# Patient Record
Sex: Male | Born: 1950
Health system: Southern US, Community
[De-identification: ages and names within clinical notes are randomized; demographics above are authoritative.]

## PROBLEM LIST (undated history)

## (undated) DIAGNOSIS — I351 Nonrheumatic aortic (valve) insufficiency: Secondary | ICD-10-CM

## (undated) DIAGNOSIS — I509 Heart failure, unspecified: Secondary | ICD-10-CM

## (undated) DIAGNOSIS — R112 Nausea with vomiting, unspecified: Secondary | ICD-10-CM

## (undated) DIAGNOSIS — I4819 Other persistent atrial fibrillation: Secondary | ICD-10-CM

## (undated) DIAGNOSIS — I517 Cardiomegaly: Secondary | ICD-10-CM

## (undated) DIAGNOSIS — M75101 Unspecified rotator cuff tear or rupture of right shoulder, not specified as traumatic: Secondary | ICD-10-CM

## (undated) DIAGNOSIS — M1990 Unspecified osteoarthritis, unspecified site: Secondary | ICD-10-CM

## (undated) DIAGNOSIS — I340 Nonrheumatic mitral (valve) insufficiency: Secondary | ICD-10-CM

## (undated) DIAGNOSIS — I499 Cardiac arrhythmia, unspecified: Secondary | ICD-10-CM

## (undated) DIAGNOSIS — G709 Myoneural disorder, unspecified: Secondary | ICD-10-CM

## (undated) DIAGNOSIS — Z95810 Presence of automatic (implantable) cardiac defibrillator: Secondary | ICD-10-CM

## (undated) DIAGNOSIS — Z974 Presence of external hearing-aid: Secondary | ICD-10-CM

## (undated) DIAGNOSIS — Z9889 Other specified postprocedural states: Secondary | ICD-10-CM

## (undated) DIAGNOSIS — G4733 Obstructive sleep apnea (adult) (pediatric): Secondary | ICD-10-CM

## (undated) DIAGNOSIS — Z8679 Personal history of other diseases of the circulatory system: Secondary | ICD-10-CM

## (undated) DIAGNOSIS — E669 Obesity, unspecified: Secondary | ICD-10-CM

## (undated) DIAGNOSIS — Z9989 Dependence on other enabling machines and devices: Secondary | ICD-10-CM

## (undated) DIAGNOSIS — Z973 Presence of spectacles and contact lenses: Secondary | ICD-10-CM

## (undated) DIAGNOSIS — I10 Essential (primary) hypertension: Secondary | ICD-10-CM

## (undated) DIAGNOSIS — R0600 Dyspnea, unspecified: Secondary | ICD-10-CM

## (undated) HISTORY — DX: Cardiomegaly: I51.7

## (undated) HISTORY — DX: Presence of external hearing-aid: Z97.4

## (undated) HISTORY — DX: Other persistent atrial fibrillation: I48.19

## (undated) HISTORY — DX: Obesity, unspecified: E66.9

## (undated) HISTORY — PX: PACEMAKER INSERTION: SHX728

## (undated) HISTORY — PX: TONSILLECTOMY: SUR1361

## (undated) HISTORY — DX: Nonrheumatic aortic (valve) insufficiency: I35.1

## (undated) HISTORY — DX: Nonrheumatic mitral (valve) insufficiency: I34.0

---

## 1989-03-14 HISTORY — PX: SHOULDER OPEN ROTATOR CUFF REPAIR: SHX2407

## 1989-03-14 HISTORY — PX: KNEE ARTHROSCOPY: SHX127

## 2004-07-14 HISTORY — PX: TOTAL KNEE ARTHROPLASTY: SHX125

## 2004-11-05 ENCOUNTER — Encounter: Admission: RE | Admit: 2004-11-05 | Discharge: 2004-11-05 | Payer: Self-pay | Admitting: Internal Medicine

## 2005-01-01 ENCOUNTER — Inpatient Hospital Stay (HOSPITAL_COMMUNITY): Admission: RE | Admit: 2005-01-01 | Discharge: 2005-01-03 | Payer: Self-pay | Admitting: Orthopedic Surgery

## 2005-01-31 ENCOUNTER — Emergency Department (HOSPITAL_COMMUNITY): Admission: EM | Admit: 2005-01-31 | Discharge: 2005-01-31 | Payer: Self-pay | Admitting: Plastic Surgery

## 2005-01-31 ENCOUNTER — Ambulatory Visit (HOSPITAL_COMMUNITY): Admission: RE | Admit: 2005-01-31 | Discharge: 2005-01-31 | Payer: Self-pay | Admitting: Internal Medicine

## 2006-07-14 HISTORY — PX: CARPAL TUNNEL RELEASE: SHX101

## 2007-06-04 ENCOUNTER — Ambulatory Visit (HOSPITAL_BASED_OUTPATIENT_CLINIC_OR_DEPARTMENT_OTHER): Admission: RE | Admit: 2007-06-04 | Discharge: 2007-06-04 | Payer: Self-pay | Admitting: Orthopedic Surgery

## 2007-07-18 ENCOUNTER — Emergency Department (HOSPITAL_COMMUNITY): Admission: EM | Admit: 2007-07-18 | Discharge: 2007-07-18 | Payer: Self-pay | Admitting: Emergency Medicine

## 2007-11-12 ENCOUNTER — Ambulatory Visit (HOSPITAL_COMMUNITY): Admission: RE | Admit: 2007-11-12 | Discharge: 2007-11-13 | Payer: Self-pay | Admitting: Specialist

## 2007-11-12 HISTORY — PX: SHOULDER OPEN ROTATOR CUFF REPAIR: SHX2407

## 2008-04-30 ENCOUNTER — Encounter: Admission: RE | Admit: 2008-04-30 | Discharge: 2008-04-30 | Payer: Self-pay | Admitting: Orthopedic Surgery

## 2008-07-14 HISTORY — PX: TOTAL HIP ARTHROPLASTY: SHX124

## 2008-08-08 ENCOUNTER — Encounter: Admission: RE | Admit: 2008-08-08 | Discharge: 2008-08-08 | Payer: Self-pay | Admitting: Orthopedic Surgery

## 2008-09-26 ENCOUNTER — Encounter: Admission: RE | Admit: 2008-09-26 | Discharge: 2008-09-26 | Payer: Self-pay | Admitting: Chiropractic Medicine

## 2008-11-16 ENCOUNTER — Encounter: Admission: RE | Admit: 2008-11-16 | Discharge: 2008-11-16 | Payer: Self-pay | Admitting: Orthopedic Surgery

## 2008-11-23 ENCOUNTER — Encounter: Admission: RE | Admit: 2008-11-23 | Discharge: 2008-11-23 | Payer: Self-pay | Admitting: Orthopedic Surgery

## 2008-12-18 ENCOUNTER — Encounter: Admission: RE | Admit: 2008-12-18 | Discharge: 2008-12-18 | Payer: Self-pay | Admitting: Specialist

## 2009-01-02 ENCOUNTER — Encounter: Admission: RE | Admit: 2009-01-02 | Discharge: 2009-01-02 | Payer: Self-pay | Admitting: Specialist

## 2009-02-28 ENCOUNTER — Inpatient Hospital Stay (HOSPITAL_COMMUNITY): Admission: RE | Admit: 2009-02-28 | Discharge: 2009-03-03 | Payer: Self-pay | Admitting: Orthopedic Surgery

## 2009-03-25 ENCOUNTER — Emergency Department (HOSPITAL_COMMUNITY): Admission: EM | Admit: 2009-03-25 | Discharge: 2009-03-26 | Payer: Self-pay | Admitting: Emergency Medicine

## 2010-10-18 LAB — HEPATIC FUNCTION PANEL
ALT: 26 U/L (ref 0–53)
AST: 24 U/L (ref 0–37)
Alkaline Phosphatase: 89 U/L (ref 39–117)
Bilirubin, Direct: 0.1 mg/dL (ref 0.0–0.3)
Indirect Bilirubin: 0.8 mg/dL (ref 0.3–0.9)
Total Protein: 7.1 g/dL (ref 6.0–8.3)

## 2010-10-18 LAB — POCT I-STAT, CHEM 8
Calcium, Ion: 1.1 mmol/L — ABNORMAL LOW (ref 1.12–1.32)
Hemoglobin: 14.3 g/dL (ref 13.0–17.0)
Sodium: 138 mEq/L (ref 135–145)
TCO2: 22 mmol/L (ref 0–100)

## 2010-10-18 LAB — URINALYSIS, ROUTINE W REFLEX MICROSCOPIC
Nitrite: NEGATIVE
Protein, ur: NEGATIVE mg/dL
Specific Gravity, Urine: 1.021 (ref 1.005–1.030)
pH: 6 (ref 5.0–8.0)

## 2010-10-18 LAB — URINE MICROSCOPIC-ADD ON

## 2010-10-18 LAB — CBC
Hemoglobin: 14 g/dL (ref 13.0–17.0)
MCV: 84.2 fL (ref 78.0–100.0)
Platelets: 229 10*3/uL (ref 150–400)
WBC: 8.4 10*3/uL (ref 4.0–10.5)

## 2010-10-18 LAB — DIFFERENTIAL
Basophils Absolute: 0 10*3/uL (ref 0.0–0.1)
Eosinophils Relative: 0 % (ref 0–5)
Monocytes Absolute: 0.6 10*3/uL (ref 0.1–1.0)
Monocytes Relative: 7 % (ref 3–12)
Neutro Abs: 6.6 10*3/uL (ref 1.7–7.7)
Neutrophils Relative %: 79 % — ABNORMAL HIGH (ref 43–77)

## 2010-10-18 LAB — LIPASE, BLOOD: Lipase: 31 U/L (ref 11–59)

## 2010-10-19 LAB — BASIC METABOLIC PANEL
BUN: 17 mg/dL (ref 6–23)
CO2: 30 mEq/L (ref 19–32)
CO2: 31 mEq/L (ref 19–32)
Calcium: 8.4 mg/dL (ref 8.4–10.5)
Chloride: 101 mEq/L (ref 96–112)
Chloride: 102 mEq/L (ref 96–112)
GFR calc Af Amer: >60 mL/min (ref >60)
GFR calc Af Amer: >60 mL/min (ref >60)
GFR calc non Af Amer: >60 mL/min (ref >60)
GFR calc non Af Amer: >60 mL/min (ref >60)
Glucose, Bld: 116 mg/dL — ABNORMAL HIGH (ref 70–99)
Glucose, Bld: 150 mg/dL — ABNORMAL HIGH (ref 70–99)
Glucose, Bld: 94 mg/dL (ref 70–99)
Potassium: 4.2 mEq/L (ref 3.5–5.1)
Potassium: 4.8 mEq/L (ref 3.5–5.1)
Sodium: 136 mEq/L (ref 135–145)
Sodium: 139 mEq/L (ref 135–145)
Sodium: 142 mEq/L (ref 135–145)

## 2010-10-19 LAB — CBC
HCT: 35.7 % — ABNORMAL LOW (ref 39.0–52.0)
HCT: 36.2 % — ABNORMAL LOW (ref 39.0–52.0)
HCT: 37.9 % — ABNORMAL LOW (ref 39.0–52.0)
HCT: 44.3 % (ref 39.0–52.0)
Hemoglobin: 12.3 g/dL — ABNORMAL LOW (ref 13.0–17.0)
Hemoglobin: 12.4 g/dL — ABNORMAL LOW (ref 13.0–17.0)
Hemoglobin: 13.1 g/dL (ref 13.0–17.0)
Hemoglobin: 15.1 g/dL (ref 13.0–17.0)
MCHC: 34.1 g/dL (ref 30.0–36.0)
MCHC: 34.8 g/dL (ref 30.0–36.0)
MCV: 86.2 fL (ref 78.0–100.0)
MCV: 86.3 fL (ref 78.0–100.0)
MCV: 87.4 fL (ref 78.0–100.0)
MCV: 85.7 fL (ref 78.0–100.0)
Platelets: 156 10*3/uL (ref 150–400)
Platelets: 163 10*3/uL (ref 150–400)
RBC: 4.14 MIL/uL — ABNORMAL LOW (ref 4.22–5.81)
RDW: 13.2 % (ref 11.5–15.5)
RDW: 13.6 % (ref 11.5–15.5)
RDW: 13.2 % (ref 11.5–15.5)
WBC: 12.7 10*3/uL — ABNORMAL HIGH (ref 4.0–10.5)

## 2010-10-19 LAB — COMPREHENSIVE METABOLIC PANEL
Alkaline Phosphatase: 48 U/L (ref 39–117)
BUN: 16 mg/dL (ref 6–23)
Chloride: 102 mEq/L (ref 96–112)
Creatinine, Ser: 1.04 mg/dL (ref 0.4–1.5)
Glucose, Bld: 137 mg/dL — ABNORMAL HIGH (ref 70–99)
Potassium: 3.2 mEq/L — ABNORMAL LOW (ref 3.5–5.1)
Total Bilirubin: 0.6 mg/dL (ref 0.3–1.2)
Total Protein: 7.2 g/dL (ref 6.0–8.3)

## 2010-10-19 LAB — URINALYSIS, ROUTINE W REFLEX MICROSCOPIC
Glucose, UA: NEGATIVE mg/dL
Hgb urine dipstick: NEGATIVE
Ketones, ur: NEGATIVE mg/dL
Protein, ur: NEGATIVE mg/dL
pH: 6 (ref 5.0–8.0)

## 2010-10-19 LAB — PROTIME-INR
INR: 1 (ref 0.00–1.49)
Prothrombin Time: 12.9 seconds (ref 11.6–15.2)

## 2010-10-19 LAB — APTT: aPTT: 24 seconds (ref 24–37)

## 2010-11-26 NOTE — Op Note (Signed)
[name], [name]               ACCOUNT NO.:  [id number]   MEDICAL RECORD NO.:  [id number]          PATIENT TYPE:  AMB   LOCATION:  DSC                          FACILITY:  [facility]   PHYSICIAN:  Cindee Salt, M.D.       DATE OF BIRTH:  03/24/1951   DATE OF PROCEDURE:  06/04/2007  DATE OF DISCHARGE:                               OPERATIVE REPORT   PREOPERATIVE DIAGNOSIS:  Carpal tunnel syndrome, left hand.   POSTOPERATIVE DIAGNOSIS:  Carpal tunnel syndrome, left hand.   OPERATION:  Decompression of left median nerve.   SURGEON:  Cindee Salt, M.D.   ASSISTANT:  Alfredo Bach, R.N.   ANESTHESIA:  Forearm-based IV regional.   HISTORY:  The patient is a 60 year old male with a history of carpal  tunnel syndrome, EMG and nerve conductions positive, which has not  responded to conservative treatment.  He is desirous of having this  surgically released.  He is aware of risks and complications including  infection, recurrence, injury to arteries, nerves and tendons,  incomplete relief of symptoms and dystrophy.  In the preoperative area,  the patient is seen, questions encouraged and answered, the extremity  marked by both the patient and surgeon, antibiotic given.   PROCEDURE:  The patient was brought to the operating room and a forearm-  based IV regional anesthetic was carried out without difficulty.  He was  prepped using DuraPrep, supine position, left arm free.  A longitudinal  incision was made in the palm and carried down through subcutaneous  tissue.  Bleeders were electrocauterized.  Palmar fascia was split,  superficial palmar arch identified, the flexor tendon to the ring and  little finger identified.  To the ulnar side of the median nerve, the  carpal retinaculum was incised with sharp dissection.  A right-angle and  Sewall retractor were placed between skin and forearm fascia.  The  fascia was released for approximately a centimeter and a half proximal  to the wrist crease  under direct vision.  The canal was found to be  extremely tight and area of compression of the nerve apparent.  No  further lesions were identified.  The skin was closed with interrupted 5-  0 Vicryl Rapide sutures.  A sterile compressive dressing and splint was  applied to the wrist.  The patient tolerated the procedure well and  taken to the recovery room for observation in satisfactory condition.  He is discharged home to return to the [facility] [place] in 1  week on Vicodin.           ______________________________  Cindee Salt, M.D.     GK/MEDQ  D:  06/04/2007  T:  06/05/2007  Job:  [id number]   cc:   Ladell Pier, M.D.

## 2010-11-26 NOTE — Op Note (Signed)
[name], [name]               ACCOUNT NO.:  [id number]   MEDICAL RECORD NO.:  [id number]          PATIENT TYPE:  INP   LOCATION:  0007                         FACILITY:  [facility]   PHYSICIAN:  Ollen Gross, M.D.    DATE OF BIRTH:  10/10/1950   DATE OF PROCEDURE:  03/01/2009  DATE OF DISCHARGE:                               OPERATIVE REPORT   PREOPERATIVE DIAGNOSIS:  Osteoarthritis left hip.   POSTOPERATIVE DIAGNOSIS:  Osteoarthritis left hip.   PROCEDURE:  Left total hip arthroplasty.   SURGEON:  Ollen Gross, M.D.   ASSISTANT:  Rozell Searing, PA-C   ANESTHESIA:  General.   DRAINS:  Hemovac x1.   COMPLICATIONS:  None.   CONDITION:  Stable to recovery room.   CLINICAL NOTE:  Mr. [name] is a 60 year old male who has a rapidly  progressive arthritis of the left hip with severe pain and dysfunction.  He has essentially gone from fairly normal looking hip to one that is  completely bone-on-bone in a relatively short amount of time.  He has  had intractable pain and given options, elects to proceed with a total  hip arthroplasty.   PROCEDURE IN DETAIL:  After successful administration of general  anesthetic, the patient is placed in the right lateral decubitus  position with the left side up and held with the hip positioner.  The  left lower extremity is isolated from his perineum with plastic drapes  and prepped and draped in the usual sterile fashion.  Short  posterolateral incision is made with a 10 blade through subcutaneous  tissue to the level of the fascia lata which was incised in line with  the skin incision.  The sciatic nerve was palpated and protected and  short external rotators were isolated off the femur.  Capsulotomy was  performed and the hip was dislocated.  The center of femoral head is  marked and the trial prosthesis is placed such that the center of trial  head corresponds to the center of his native femoral head.  Osteotomy  line is marked on the  femoral neck and osteotomy made with an  oscillating saw.  The femoral head is removed and the femur retracted  anteriorly to gain acetabular exposure.   Acetabular retractors were placed and labrum and osteophytes removed.  Acetabular reaming begins at 47 mm coursing in increments of 2 to 53 mm  and then a 54-mm Pinnacle acetabular shell is impacted in anatomic  position with outstanding purchase.  There is no need for additional  dome screws.  Apex hole eliminator is placed and the 36 mm neutral  Ultramet metal liner is placed for metal-on-metal hip replacement.   The femur is prepared with the canal finder and irrigation.  Axial  reaming is performed to 15.5 mm proximal, reaming to 20-F and the sling  machined to a small a 20-F.  A 20-F small trial sleeve is placed with a  20 x 15 stem and a 36 plus 8 neck matching native anteversion.  A 36  plus 0 head is placed and the hips reduced with outstanding stability.  There is full extension, full external rotation to 70 degrees flexion,  40 degrees adduction, 90 degrees internal rotation, 90 degrees of  flexion, 70 degrees of internal rotation.  By placing the left leg on  top of the right I felt as though leg lengths were equal.  The hip was  then dislocated and trials removed.  The permanent 20-F small sleeve is  placed and a 20 x 15 stem, 36 plus 8 neck matching native anteversion.  The 36 plus 0 head is placed and the hip reduced with the same stability  parameters.  Wound is copiously irrigated with saline solution and then  the capsule and short rotators reattached to the femur through drill  holes and Ethibond suture.  Fascia lata was closed over Hemovac drain  with #1 Vicryl,  subcu closed with #1-0 and #2-0 Vicryl  and  subcuticular running 4-0 Monocryl.  Incision was then cleaned and dried  and Steri-Strips and bulky sterile dressing were applied.  He is then  placed into a knee immobilizer, awakened and transferred to recovery  in  stable condition.      Ollen Gross, M.D.  Electronically Signed     FA/MEDQ  D:  02/28/2009  T:  03/01/2009  Job:  [id number]

## 2010-11-26 NOTE — H&P (Signed)
[name], [name]               ACCOUNT NO.:  [id number]   MEDICAL RECORD NO.:  [id number]          PATIENT TYPE:  INP   LOCATION:  NA                           FACILITY:  [facility]   PHYSICIAN:  [name], M.D.    DATE OF BIRTH:  1950-07-17   DATE OF ADMISSION:  02/28/2009  DATE OF DISCHARGE:                              HISTORY & PHYSICAL   CHIEF COMPLAINT:  Left hip pain.   HISTORY OF PRESENT ILLNESS:  The patient is a 60 year old male who has  been seen by Dr. Lequita Andrews, referred over by Dr. Jillyn Andrews for deterioration of  the hip.  It has been ongoing for quite some time now.  He as initially  evaluated over a year ago by Dr. Wyline Andrews and felt he had some intra-  articular issues.  He went for a cortisone injection and fortunately did  not provide much benefit.  MRI showed deterioration of the hip with  possible labral tear.  Worked up also for the lumbar spine with MRI and  injections, but no benefit.  Dr. Jeral Andrews told him initially that it was  definitely coming from the hip.  He has had progressive worsening pain.  He is seeing Dr. Jillyn Andrews to differentiate between the hip and the back.  He  went for intra-articular lidocaine injection which provided considerable  relief.  He followed back up with Dr. Lequita Andrews and found to have  progressive collapse of the joint space and now he is bone-on-bone.  He  is felt to have a rapidly progressive osteoarthritis with synovitis.  It  is felt he would benefit undergoing surgical intervention.  Risks and  benefits have been discussed.  He elects to with surgery.  The patient  has been seen preoperatively by Dr. Nehemiah Andrews and felt to be stable for  surgery.   ALLERGIES:  OXYCODONE AND MORPHINE.   CURRENT MEDICATIONS:  [name], [name], [name].   PAST MEDICAL HISTORY:  1. Hypertension.  2. Sleep apnea, uses CPAP.  3. Tinnitus.   PAST SURGICAL HISTORY:  1. Right total knee in June 2007 with multiple knee surgeries.  2. Left  rotator cuff.  3. Right rotator cuff.   SOCIAL HISTORY:  Denies use of tobacco products.  Sketchy use of  alcohol.   FAMILY HISTORY:  Negative.   REVIEW OF SYSTEMS:  GENERAL:  No fevers, chills or night sweats.  NEUROLOGICAL:  No seizures, syncope or paralysis.  RESPIRATORY:  No  shortness of breath, productive cough or hemoptysis.  CARDIOVASCULAR:  No chest pain, angina, orthopnea.  GI:  No nausea, vomiting, diarrhea or  constipation.  GU:  No dysuria, hematuria or discharge.  MUSCULOSKELETAL:  Left hip.   PHYSICAL EXAMINATION:  VITAL SIGNS:  Pulse 76, respirations 14, blood  pressure 140/88.  GENERAL:  A 60 year old white male, well-nourished, well-developed,  overweight, short stature, no acute distress.  HEENT:  Normocephalic, atraumatic.  Pupils round and reactive.  EOMs  intact.  NECK:  Supple.  No carotid bruits.  CHEST:  Clear anterior posterior chest walls.  No rhonchi, rales or  wheezing.  HEART:  Regular rate  and rhythm.  No murmur, S1-S10.  ABDOMEN:  Soft, protuberant abdomen.  Bowel sounds present.  RECTAL/BREASTS/GENITALIA:  Not done.  Not pertinent to present illness.  EXTREMITIES:  Left hip flexion 90, zero internal rotation, zero external  rotation, 20 degrees abduction.   IMPRESSION:  Osteoarthritis left hip.   PLAN:  The patient admitted to [facility] to undergo a left  total replacement arthroplasty.  Surgery will be performed by [name].      [name], P.A.C.      [name], M.D.  Electronically Signed    ALP/MEDQ  D:  02/23/2009  T:  02/23/2009  Job:  [id number]   cc:   [name], M.D.  Fax: [phone]

## 2010-11-26 NOTE — Op Note (Signed)
[name], [name]               ACCOUNT NO.:  [id number]   MEDICAL RECORD NO.:  [id number]          PATIENT TYPE:  AMB   LOCATION:  DAY                          FACILITY:  [facility]   PHYSICIAN:  Jene Every, M.D.    DATE OF BIRTH:  May 16, 1951   DATE OF PROCEDURE:  DATE OF DISCHARGE:                               OPERATIVE REPORT   PREOPERATIVE DIAGNOSES:  Rotator cuff tear and degenerative joint  disease of the right shoulder.   POSTOPERATIVE DIAGNOSES:  Rotator cuff tear and degenerative joint  disease of the right shoulder with dislocation of biceps tendon.   PROCEDURE PERFORMED:  Open rotator cuff repair, subacromial  decompression, acromioplasty, followed by biceps tenodesis.   ANESTHESIA:  General.   ASSISTANT:  Roma Schanz, P.A.   BRIEF HISTORY:  The patient is a 60 year old who fell on his shoulder,  having pain in the shoulder, rotator cuff tear, massive, noted by MRI.  Also dislocation of biceps tendon, elevation of the humeral head.  Indicated for attempted repair of the rotator cuff, possible biceps  tenodesis and patch graft.  Risks and benefits discussed including  bleeding, infection, inability to repair, suboptimal range of motion,  need for revision, and shoulder arthroplasty future, et Karie Soda.   DESCRIPTION OF PROCEDURE:  The patient in a beach-chair position.  After  induction of adequate anesthesia, 2 grams Kefzol, the right shoulder and  upper extremity was prepped and draped in the usual sterile fashion.  Surgical marker was utilized to delineate the acromion, AC joint and  coracoid.  An incision was made over the anterolateral aspect of the  acromion in the midline with a raphe.  Subcutaneous tissue was  dissected.  Bipolar cautery was utilized to achieve hemostasis.  Two  centimeters on either side of the anterolateral corner was utilized.  The deltoid was subperiosteally elevated from the anterolateral,  anteromedial aspect of the acromion, CA  ligament was attached.  Subacromial adhesions were digitally lysed.  Oscillating saw was  utilized to remove the anterior part of the acromion.  Wound was  copiously irrigated.  Arthrosis was noted of the humeral head and of the  glenoid.  There was a complete absence of the rotator cuff on the  glenoid.   On careful inspection and multiple passes, there was no infraspinatus or  supraspinatus that was able to be advanced to the tuberosity for repair.  There was subscapularis tendinous tear that was torn as well in the  superior portion.  It was retracted, very attenuated.  Biceps tendon was  intact.  Its attachment, however, was dislocated medially.  We felt this  would aid as the humeral head depressor.  We proceeded with a biceps  tenodesis.  We found the bicipital groove, curetted to good bleeding  tissue and placed a Bio-Anchor after an awl and then insertion of the  anchor in the proximal portion of that with excellent purchase with  FiberWire.  The tendon was advanced to the bicipital groove by the  FiberWires that were placed through the tendon and wide aperture with a  surgeon's knot advancing the tendon  into the bicipital groove.  This was  up to the humeral head.   Just prior to that, I also curetted the lesser tuberosity.  The remnant  of the superior portion of the subscapularis was advanced to that  portion of the tuberosity and the FiberWire suture ends that were  preserved after the surgical knot were then advanced into the  subscapularis and used to tied the subscapularis down to the lesser  tuberosity.  Again this was very attenuated.  This was oversewn as well  with the biceps tendon.  The wound was then copiously irrigated.  We  explored once again for remnant of the supraspinatus and the  infraspinatus which were not available.  We therefore felt that this was  the best repair possible and at least might give him some internal  rotation power, as well as depressing  the humeral head.  Wound copiously  irrigated.  I reattach the CA ligament in the anterior aspect of the  acromion and repaired the raphe with #1 Vicryl interrupted figure-of-  eight sutures.  Subcutaneous tissue reapproximated 2-0 Vicryl.  The skin  was reapproximated with 4-0 subcuticular Prolene.  Wound reinforced with  Steri-Strips.  Sterile dressing applied, placed in abduction.  Extubated  without difficulty and transported to the recovery room in satisfactory  condition.  The patient tolerated the procedure well, there were no  complications.      Jene Every, M.D.  Electronically Signed     JB/MEDQ  D:  11/12/2007  T:  11/12/2007  Job:  [id number]

## 2010-11-29 NOTE — Discharge Summary (Signed)
[name], [name]               ACCOUNT NO.:  [id number]   MEDICAL RECORD NO.:  [id number]          PATIENT TYPE:  INP   LOCATION:  5016                         FACILITY:  [facility]   PHYSICIAN:  Elana Alm. Thurston Hole, M.D. DATE OF BIRTH:  15-Oct-1950   DATE OF ADMISSION:  01/01/2005  DATE OF DISCHARGE:  01/03/2005                                 DISCHARGE SUMMARY   ADMISSION DIAGNOSES:  1.  End-stage degenerative joint disease, right knee.  2.  Hypertension.   DISCHARGE DIAGNOSES:  1.  End-stage degenerative joint disease, right knee.  2.  Hypertension.  3.  Sleep apnea.   HISTORY OF PRESENT ILLNESS:  The patient is a 60 year old white male who has  had a history of end-stage DJD of his right knee.  He has tried conservative  care, including anti-inflammatories and cortisone injections without  success.  He understands the risks, benefits, and possible complications of  a right total knee replacement and is without question.   PROCEDURE:  On January 01, 2005, patient underwent a right total knee  replacement by Dr. Thurston Hole and a femoral nerve block by anesthesia.  He  tolerated both procedures well.  Respiratory care was consulted for CPAP  placement.   HOSPITAL COURSE:  On postoperative day #1, the patient's vital signs were  stable, he was afebrile.  Hemoglobin was 12.9, and he was metabolically  stable.  Surgical wounds well-approximated.  His drain was discontinued.  His Foley was discontinued.  Physical therapy was begun.   On postoperative day #2, vital signs were stable, he was afebrile.  His  hemoglobin was 12.7.  He was metabolically stable.  Surgical wounds were  well-approximated.  He was discharged to home, weightbearing as tolerated,  in stable condition.   DISCHARGE MEDICATIONS:  1.  Lovenox.  2.  Coumadin.  3.  Lisinopril 40 mg one p.o. b.i.d.  4.  Colace 100 mg one tablet twice a day.  5.  Milk of Magnesia 60 cc p.o. today.   DISCHARGE INSTRUCTIONS:  CPM 0 to 90  degrees eight hours a day.  Elevate his  right heel on full pillow 30 minutes every morning.  Call with increased  pain, increased swelling, increased drainage, or a temperature greater than  101.   FOLLOW UP:  He will follow up with Dr. Thurston Hole on January 13, 2005.      Kirstin Shepperson, P.A.      Robert A. Thurston Hole, M.D.  Electronically Signed    KS/MEDQ  D:  03/13/2005  T:  03/13/2005  Job:  [id number]

## 2010-11-29 NOTE — Op Note (Signed)
[name], [name]               ACCOUNT NO.:  [id number]   MEDICAL RECORD NO.:  [id number]          PATIENT TYPE:  INP   LOCATION:  2550                         FACILITY:  [facility]   PHYSICIAN:  Robert A. Thurston Hole, M.D. DATE OF BIRTH:  Dec 21, 1950   DATE OF PROCEDURE:  01/01/2005  DATE OF DISCHARGE:                                 OPERATIVE REPORT   PREOPERATIVE DIAGNOSIS:  Right knee DJD.   POSTOPERATIVE DIAGNOSIS:  Right knee DJD.   PROCEDURE:  1.  Right total knee replacement using DePuy cemented total knee system with      #5 cemented femur, #4 cemented tibia with 10 mm polyethylene RP tibial      spacer and 35 mm polyethylene cemented patella.  2.  Right knee computer assisted navigation.   SURGEON:  Elana Alm. Thurston Hole, M.D.   ASSISTANT:  Julien Girt, P.A.   ANESTHESIA:  General.   OPERATIVE TIME:  1 hour and 50 minutes.   COMPLICATIONS:  None.   DESCRIPTION OF PROCEDURE:  Mr. Gombos was brought to operating room on January 01, 2005, placed on the operative table in supine position. After adequate  level of general anesthesia was obtained, he had a femoral nerve block  placed by anesthesia for postoperative pain control.  He had a Foley  catheter placed under sterile conditions. He received Ancef 1 g IV  preoperatively for prophylaxis. His right knee was examined. He had a range  of motion from -20 to 125 degrees, mild valgus deformity, knee stable to  varus valgus and posterior stress with normal patellar tracking. Right leg  and foot was prepped using sterile DuraPrep and draped using sterile  technique. Leg was exsanguinated and a thigh tourniquet elevated to 365 mm.  Initially through a 15 cm longitudinal incision based over the patella,  initial exposure was made.  The underlying subcutaneous tissues were incises  in line with skin incision. A median arthrotomy was performed revealing an  excessive amount of normal-appearing joint fluid. The articular surfaces  were inspected. He had grade 4 changes medially, laterally and the  patellofemoral joint. Osteophytes were removed off the femoral condyles and  tibial plateau. After this was done then medial and lateral meniscal  remnants were removed as well as the anterior cruciate ligament. At this  point then, two pins were placed and the distal femur and 2 pounds of the  proximal tibia for placement of the computer navigation system. The system  was then activated. Initial measurement showed flexion contracture of 20  degrees and valgus malalignment of approximately 7 degrees.  At this point  then distal femoral cuts were made using computer navigation resecting 11 mm  off the distal femur. These cuts were all verified and shown to be perfect  room with the navigation system.  A number 5 was found to be appropriate  size. A #5 cutting jig was placed and then these cuts were made. Again these  were all verified with the computer for perfect and accurate cuts. At this  point the proximal tibia was exposed and then using the computer navigation  system, a 10 mm proximal cut was made in the appropriate rotation. After  this cut was made, the number four tibial guide was placed, checked for  verification and found to have a perfect position and then the keel cut was  made. The #5 femoral trial was placed and with a 10 mm polyethylene tibial  spacer, the knee was taken through a full range of motion, found to have  excellent correction of this flexion deformity to approximately minus 3-5  degrees and his valgus deformity was only 1 degree to neutral. The patella  was then sized. A resurfacing 8 mm cut was made and three locking holes were  placed for a 35 mm prosthesis. With the patella prosthesis in place, a  patellar tracking was evaluated and this was found to be normal. At this  point it was felt that all trial components were excellent size, fit and  stability and then the deformities had been  corrected. The navigation system  was then deactivated and the pins were removed. The trial components were  removed. The knee was then jet lavage irrigated with 3 liters of saline and  then the proximal tibia was exposed and a #4 tibial tray with cement backing  was hammered into position with an excellent fit with excess cement being  removed from around the edges. The #5 femoral component was hammered into  position also with an excellent fit with excess cement being removed around  the edges. A 10 mm polyethylene RP tibial spacer was then placed on the  tibial baseplate and knee taken through a full range of motion from 0 to 125  degrees with excellent stability and excellent correction of his valgus and  flexion deformities. The 35 mm polyethylene cement backed patella was then  placed into its three locking holes and held there with a clamp. After the  cement hardened, patellofemoral tracking was evaluated and this was found to  be normal. At this point, the tourniquet was released. Hemostasis was  obtained with cautery. The wound was copiously irrigated again and then the  arthrotomy was closed with a #1 Ethibond suture over two medium Hemovac  drains.  Subcutaneous tissues closed with 0 and 2-0 Vicryl, skin closed with  subcuticular Monocryl. Steri-Strips were applied. Sterile dressings and a  long-leg splint applied. A Hemovac injected with 0.25% Marcaine with  epinephrine and 4 milligrams morphine and the patient awakened and taken to  recovery in stable condition.   SPONGE AND NEEDLE COUNT:  Correct x 2 at the end of the case.       RAW/MEDQ  D:  01/01/2005  T:  01/01/2005  Job:  [id number]

## 2011-04-22 LAB — BASIC METABOLIC PANEL
BUN: 11
Calcium: 9.3
GFR calc non Af Amer: >60
Glucose, Bld: 94

## 2011-06-14 DIAGNOSIS — M75101 Unspecified rotator cuff tear or rupture of right shoulder, not specified as traumatic: Secondary | ICD-10-CM

## 2011-06-14 HISTORY — DX: Unspecified rotator cuff tear or rupture of right shoulder, not specified as traumatic: M75.101

## 2011-09-25 ENCOUNTER — Other Ambulatory Visit: Payer: Self-pay | Admitting: Orthopedic Surgery

## 2011-09-26 ENCOUNTER — Encounter (HOSPITAL_COMMUNITY): Payer: Self-pay | Admitting: Pharmacy Technician

## 2011-10-01 ENCOUNTER — Encounter (HOSPITAL_COMMUNITY)
Admission: RE | Admit: 2011-10-01 | Discharge: 2011-10-01 | Disposition: A | Discharge status: Home / Self Care | Payer: 59 | Source: Ambulatory Visit | Attending: Orthopedic Surgery | Admitting: Orthopedic Surgery

## 2011-10-01 ENCOUNTER — Ambulatory Visit (HOSPITAL_COMMUNITY)
Admission: RE | Admit: 2011-10-01 | Discharge: 2011-10-01 | Disposition: A | Discharge status: Home / Self Care | Payer: 59 | Source: Ambulatory Visit | Attending: Orthopedic Surgery | Admitting: Orthopedic Surgery

## 2011-10-01 ENCOUNTER — Encounter (HOSPITAL_COMMUNITY): Payer: Self-pay

## 2011-10-01 DIAGNOSIS — M25559 Pain in unspecified hip: Secondary | ICD-10-CM | POA: Insufficient documentation

## 2011-10-01 DIAGNOSIS — Z01818 Encounter for other preprocedural examination: Secondary | ICD-10-CM | POA: Insufficient documentation

## 2011-10-01 DIAGNOSIS — Z01812 Encounter for preprocedural laboratory examination: Secondary | ICD-10-CM | POA: Insufficient documentation

## 2011-10-01 HISTORY — DX: Essential (primary) hypertension: I10

## 2011-10-01 HISTORY — DX: Other specified postprocedural states: Z98.890

## 2011-10-01 HISTORY — DX: Nausea with vomiting, unspecified: R11.2

## 2011-10-01 HISTORY — DX: Unspecified rotator cuff tear or rupture of right shoulder, not specified as traumatic: M75.101

## 2011-10-01 LAB — CBC
HCT: 47.1 % (ref 39.0–52.0)
MCH: 28.2 pg (ref 26.0–34.0)
MCV: 83.7 fL (ref 78.0–100.0)
Platelets: 177 10*3/uL (ref 150–400)
RDW: 13.3 % (ref 11.5–15.5)

## 2011-10-01 LAB — COMPREHENSIVE METABOLIC PANEL
AST: 21 U/L (ref 0–37)
Albumin: 4.2 g/dL (ref 3.5–5.2)
BUN: 14 mg/dL (ref 6–23)
CO2: 30 mEq/L (ref 19–32)
Calcium: 9.4 mg/dL (ref 8.4–10.5)
Chloride: 101 mEq/L (ref 96–112)
Creatinine, Ser: 0.81 mg/dL (ref 0.50–1.35)
GFR calc non Af Amer: >90 mL/min (ref >90)
Total Bilirubin: 0.5 mg/dL (ref 0.3–1.2)

## 2011-10-01 LAB — URINALYSIS, ROUTINE W REFLEX MICROSCOPIC
Bilirubin Urine: NEGATIVE
Ketones, ur: NEGATIVE mg/dL
Leukocytes, UA: NEGATIVE
Nitrite: NEGATIVE
Urobilinogen, UA: 0.2 mg/dL (ref 0.0–1.0)
pH: 7 (ref 5.0–8.0)

## 2011-10-01 LAB — PROTIME-INR
INR: 0.96 (ref 0.00–1.49)
Prothrombin Time: 13 seconds (ref 11.6–15.2)

## 2011-10-01 NOTE — Pre-Procedure Instructions (Signed)
EKG 08-14-2011 DR POLITE ON CHART

## 2011-10-01 NOTE — Patient Instructions (Addendum)
20 OLUWAFEMI VILLELLA  10/01/2011   Your procedure is scheduled on:  10-08-11  Report to [facility] at 230 pm.  Call this number if you have problems the morning of surgery: [phone]   Remember:bring CPAP MASK ONLY  Do not eat food or drink liquids:After Midnight.  .  Take these medicines the morning of surgery with A SIP OF WATER: no meds to take   Do not wear jewelry or make up.  Do not wear lotions, powders, or perfumes.Do not wear deodorant.    Do not bring valuables to the hospital.  Contacts, dentures or bridgework may not be worn into surgery.  Leave suitcase in the car. After surgery it may be brought to your room.  For patients admitted to the hospital, checkout time is 11:00 AM the day of discharge.     Special Instructions: CHG Shower Use Special Wash: 1/2 bottle night before surgery and 1/2 bottle morning of surgery.neck down avoid private area   Please read over the following fact sheets that you were given: MRSA Information, blood fact sheet  Cain Sieve [facility] pre op nurse phone number [phone], call if needed

## 2011-10-07 ENCOUNTER — Other Ambulatory Visit: Payer: Self-pay | Admitting: Orthopedic Surgery

## 2011-10-07 NOTE — H&P (Signed)
[name]  DOB: May 11, 1951 Married / Language: English / Race: White / Male  Date of Admission:  10/08/2011  Chief Complaint:  Left Hip Pain  History of Present Illness The patient is a 61 year old male who comes in for a preoperative History and Physical. The patient is scheduled for a left hip poly revision vs. acetabular revision to be performed by Dr. Gus Rankin. Aluisio, MD at [facility] on 10/08/2011. The patient is a 61 year old male who presents for follow up of their hip. The patient is being followed for their left hip pain. The patient has not gotten any relief of their symptoms with corticosteroid use. He saw no change at all with the steroids He feels like the hip is progressively worsening. It is mainly the groin pain and lateral pain. He did have fluid on the MARS MRI. 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 He is wanting to go ahead and proceed with revision of the bearing surface. They have been treated conservatively in the past for the above stated problem and despite conservative measures, they continue to have progressive pain and severe functional limitations and dysfunction. They have failed non-operative management including home exercise, medications, and injections. It is felt that they would benefit from undergoing total joint replacement. Risks and benefits of the procedure have been discussed with the patient and they elect to proceed with surgery. There are no active contraindications to surgery such as ongoing infection or rapidly progressive neurological disease.  Allergies No Known Drug Allergies  Medication History Lisinopril (40MG  Tablet, Oral) Active. Hydrochlorothiazide (12.5MG  Tablet, Oral) Active.  Problem List/Past Medical Pain, Hip (719.45) Past History of C. difficile colitis Sleep Apnea High blood pressure  Past Surgical History Total Hip Replacement. left Total Knee Replacement. right Rotator  Cuff Repair. right Arthroscopy of Knee. bilateral Arthroscopy of Shoulder. bilateral Vasectomy  Family History Heart Disease. mother and father Rheumatoid Arthritis. mother  Social History Exercise. Exercises weekly Illicit drug use. no Living situation. live with spouse Current work status. working full time Drug/Alcohol Rehab (Currently). no Drug/Alcohol Rehab (Previously). no Tobacco / smoke exposure. no Tobacco use. never smoker Marital status. married Number of flights of stairs before winded. 2-3 Pain Contract. no Children. 3 Alcohol use. current drinker; only occasionally per week  Review of Systems General:Not Present- Chills, Fever, Night Sweats, Fatigue, Weight Gain, Weight Loss and Memory Loss. Skin:Not Present- Hives, Itching, Rash, Eczema and Lesions. HEENT:Not Present- Tinnitus, Headache, Double Vision, Visual Loss, Hearing Loss and Dentures. Respiratory:Present- Shortness of breath with exertion. Not Present- Shortness of breath at rest, Allergies, Coughing up blood and Chronic Cough. Cardiovascular:Not Present- Chest Pain, Racing/skipping heartbeats, Difficulty Breathing Lying Down, Murmur, Swelling and Palpitations. Gastrointestinal:Not Present- Bloody Stool, Heartburn, Abdominal Pain, Vomiting, Nausea, Constipation, Diarrhea, Difficulty Swallowing, Jaundice and Loss of appetitie. Male Genitourinary:Not Present- Urinary frequency, Blood in Urine, Weak urinary stream, Discharge, Flank Pain, Incontinence, Painful Urination, Urgency, Urinary Retention and Urinating at Night. Musculoskeletal:Present- Joint Pain. Not Present- Muscle Weakness, Muscle Pain, Joint Swelling, Back Pain, Morning Stiffness and Spasms. Neurological:Not Present- Tremor, Dizziness, Blackout spells, Paralysis, Difficulty with balance and Weakness. Psychiatric:Not Present- Insomnia.  Vitals Weight: 278 lb Height: 70 in Body Surface Area: 2.5 m Body Mass Index:  39.89 kg/m Pulse: 62 (Regular) Resp.: 12 (Unlabored) BP: 146/76 (Sitting, Left Arm, Standard)  Physical Exam The physical exam findings are as follows:  General Mental Status - Alert, cooperative and good historian. General Appearance- pleasant. Not  in acute distress. Orientation- Oriented X3. Build & Nutrition- Well nourished and Well developed.  Head and Neck Head- normocephalic, atraumatic . Neck Global Assessment- supple. no bruit auscultated on the right and no bruit auscultated on the left.  Eye Pupil- Bilateral- Regular and Round. Note: wears glasses Motion- Bilateral- EOMI.  Chest and Lung Exam Auscultation: Breath sounds:- clear at anterior chest wall and - clear at posterior chest wall. Adventitious sounds:- No Adventitious sounds.  Cardiovascular Auscultation:Rhythm- Regular rate and rhythm. Heart Sounds- S1 WNL and S2 WNL. Murmurs & Other Heart Sounds:Auscultation of the heart reveals - No Murmurs.  Abdomen Palpation/Percussion:Tenderness- Abdomen is non-tender to palpation. Rigidity (guarding)- Abdomen is soft. Auscultation:Auscultation of the abdomen reveals - Bowel sounds normal.  Male Genitourinary Not done, not pertinent to present illness  Musculoskeletal On exam he is alert and oriented in no apparent distress. His left hip can be flexed to about 95, rotated in 20, out 30, abducted 30 without discomfort.  Assessment & Plan Prosthetic joint implant failure Left Hip  Patient is for a Left Poly Revision vs. Acetabular Revision by Dr. Lequita Halt.  Plan is to go home after the surgery.  PCP - Dr. Renford Dills  Avel Peace, PA-C

## 2011-10-08 ENCOUNTER — Inpatient Hospital Stay (HOSPITAL_COMMUNITY)
Admission: RE | Admit: 2011-10-08 | Discharge: 2011-10-10 | DRG: 468 | Disposition: A | Discharge status: Home Health | Payer: 59 | Source: Ambulatory Visit | Attending: Orthopedic Surgery | Admitting: Orthopedic Surgery

## 2011-10-08 ENCOUNTER — Encounter (HOSPITAL_COMMUNITY): Payer: Self-pay | Admitting: *Deleted

## 2011-10-08 ENCOUNTER — Ambulatory Visit (HOSPITAL_COMMUNITY): Payer: 59 | Admitting: Anesthesiology

## 2011-10-08 ENCOUNTER — Encounter (HOSPITAL_COMMUNITY)
Admission: RE | Disposition: A | Discharge status: Home Health | Payer: Self-pay | Source: Ambulatory Visit | Attending: Orthopedic Surgery

## 2011-10-08 ENCOUNTER — Encounter (HOSPITAL_COMMUNITY): Payer: Self-pay | Admitting: Anesthesiology

## 2011-10-08 ENCOUNTER — Ambulatory Visit (HOSPITAL_COMMUNITY): Payer: 59

## 2011-10-08 DIAGNOSIS — I10 Essential (primary) hypertension: Secondary | ICD-10-CM | POA: Diagnosis present

## 2011-10-08 DIAGNOSIS — T8484XA Pain due to internal orthopedic prosthetic devices, implants and grafts, initial encounter: Secondary | ICD-10-CM

## 2011-10-08 DIAGNOSIS — Z96649 Presence of unspecified artificial hip joint: Secondary | ICD-10-CM

## 2011-10-08 DIAGNOSIS — Y831 Surgical operation with implant of artificial internal device as the cause of abnormal reaction of the patient, or of later complication, without mention of misadventure at the time of the procedure: Secondary | ICD-10-CM | POA: Diagnosis present

## 2011-10-08 DIAGNOSIS — T8489XA Other specified complication of internal orthopedic prosthetic devices, implants and grafts, initial encounter: Principal | ICD-10-CM | POA: Diagnosis present

## 2011-10-08 DIAGNOSIS — Z96659 Presence of unspecified artificial knee joint: Secondary | ICD-10-CM

## 2011-10-08 HISTORY — PX: TOTAL HIP REVISION: SHX763

## 2011-10-08 IMAGING — CR DG PORTABLE PELVIS
1 series · 1 of 1 positions shown · non-contrast
Comparison: [DATE], [DATE]

CLINICAL DATA: Postop left total hip

PORTABLE PELVIS

[view not recorded]
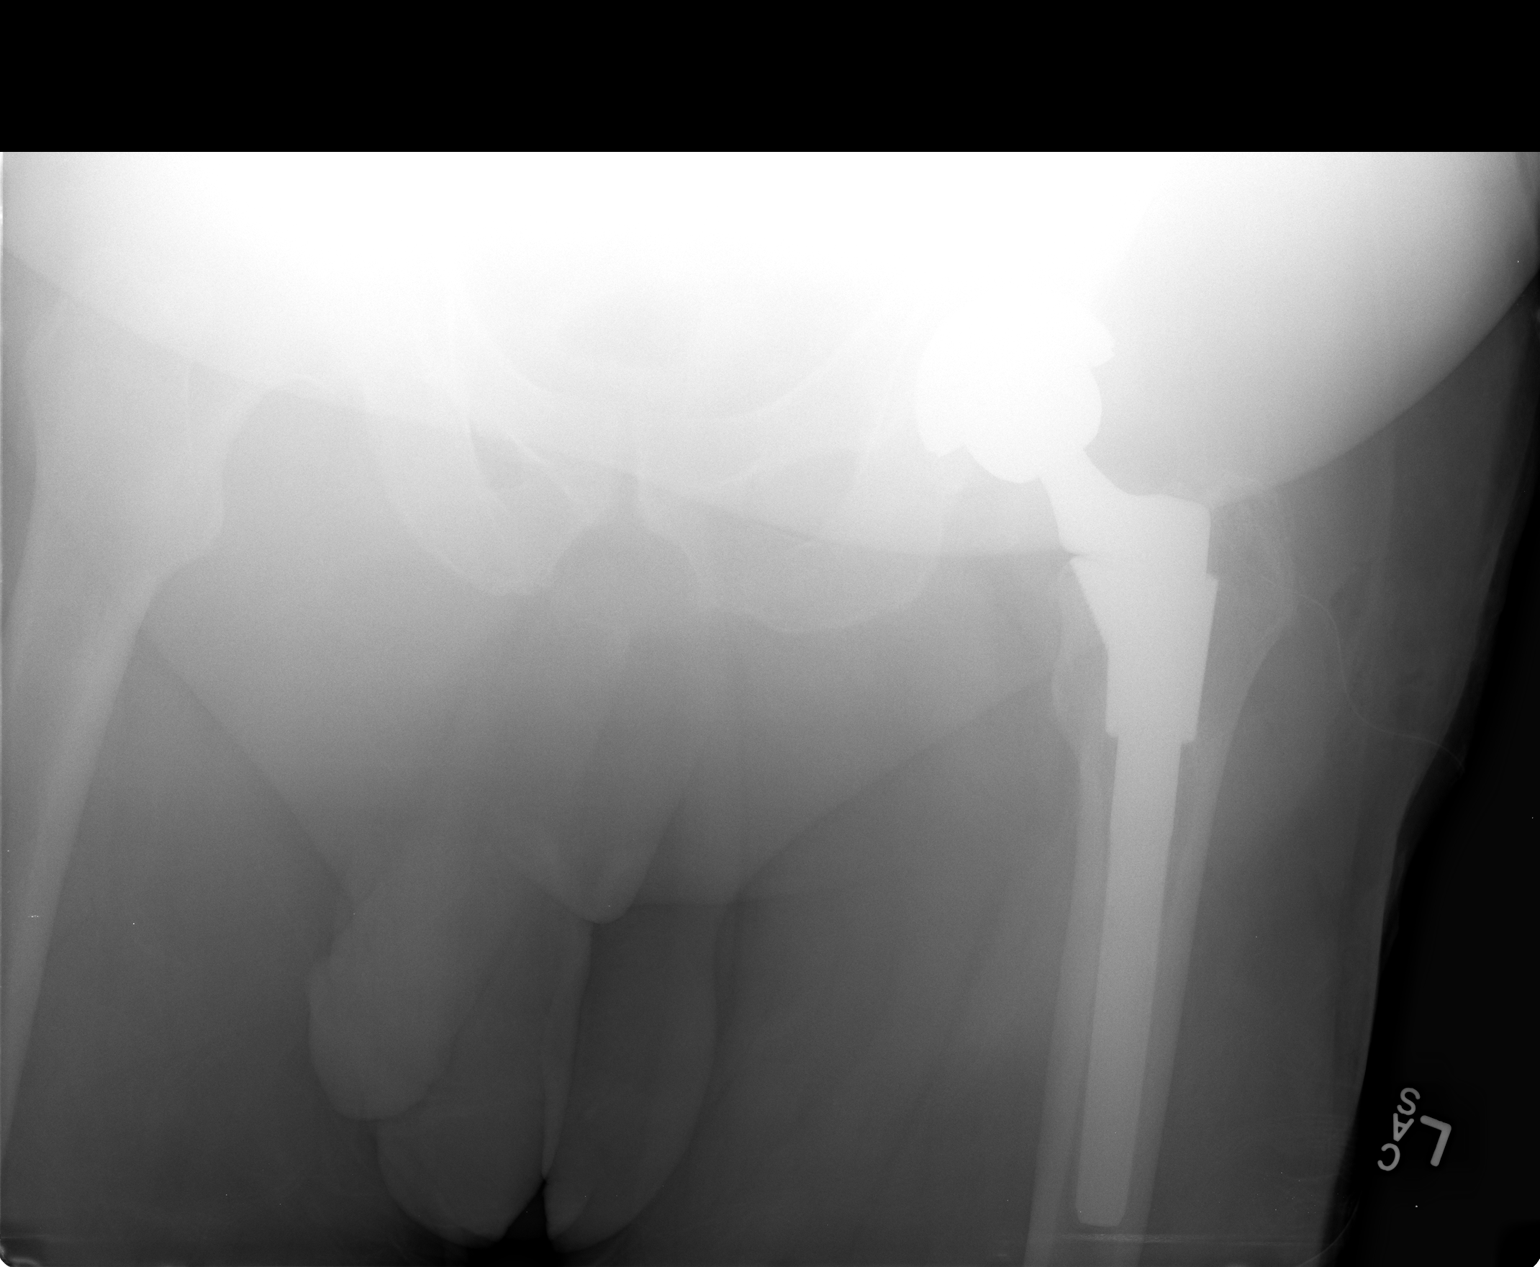

[1 of 1 positions shown; findings below may reference images not displayed]

FINDINGS: Left hip replacement has a similar appearance to the
prior study. No immediate complication.  No fracture.
IMPRESSION: Satisfactory left hip replacement.

## 2011-10-08 SURGERY — TOTAL HIP REVISION
Anesthesia: General | Site: Hip | Laterality: Left

## 2011-10-08 MED ORDER — PHENOL 1.4 % MT LIQD: 1.0000 | OROMUCOSAL | Status: DC | PRN

## 2011-10-08 MED ORDER — ONDANSETRON HCL 4 MG/2ML IJ SOLN: 4.0000 mg | Freq: Four times a day (QID) | INTRAMUSCULAR | Status: DC | PRN

## 2011-10-08 MED ORDER — ROCURONIUM BROMIDE 100 MG/10ML IV SOLN
INTRAVENOUS | Status: DC | PRN
  Administered 2011-10-08: 40 mg via INTRAVENOUS

## 2011-10-08 MED ORDER — ACETAMINOPHEN 10 MG/ML IV SOLN
1000.0000 mg | Freq: Four times a day (QID) | INTRAVENOUS | Status: AC
  Administered 2011-10-08 – 2011-10-09 (×4): 1000 mg via INTRAVENOUS
  Filled 2011-10-08 (×4): qty 100

## 2011-10-08 MED ORDER — POLYETHYLENE GLYCOL 3350 17 G PO PACK
17.0000 g | PACK | Freq: Every day | ORAL | Status: DC | PRN
  Filled 2011-10-08: qty 1

## 2011-10-08 MED ORDER — MORPHINE SULFATE 2 MG/ML IJ SOLN
1.0000 mg | INTRAMUSCULAR | Status: DC | PRN
  Administered 2011-10-08 – 2011-10-09 (×3): 2 mg via INTRAVENOUS
  Filled 2011-10-08 (×3): qty 1

## 2011-10-08 MED ORDER — MENTHOL 3 MG MT LOZG: 1.0000 | LOZENGE | OROMUCOSAL | Status: DC | PRN

## 2011-10-08 MED ORDER — CEFAZOLIN SODIUM-DEXTROSE 2-3 GM-% IV SOLR
2.0000 g | INTRAVENOUS | Status: AC
  Administered 2011-10-08: 2 g via INTRAVENOUS

## 2011-10-08 MED ORDER — METHOCARBAMOL 500 MG PO TABS
500.0000 mg | ORAL_TABLET | Freq: Four times a day (QID) | ORAL | Status: DC | PRN
  Administered 2011-10-08 – 2011-10-09 (×2): 500 mg via ORAL
  Filled 2011-10-08 (×2): qty 1

## 2011-10-08 MED ORDER — BISACODYL 10 MG RE SUPP: 10.0000 mg | Freq: Every day | RECTAL | Status: DC | PRN

## 2011-10-08 MED ORDER — LACTATED RINGERS IV SOLN: INTRAVENOUS | Status: DC

## 2011-10-08 MED ORDER — PROPOFOL 10 MG/ML IV BOLUS
INTRAVENOUS | Status: DC | PRN
  Administered 2011-10-08: 200 mg via INTRAVENOUS

## 2011-10-08 MED ORDER — MIDAZOLAM HCL 5 MG/5ML IJ SOLN
INTRAMUSCULAR | Status: DC | PRN
  Administered 2011-10-08: 2 mg via INTRAVENOUS

## 2011-10-08 MED ORDER — RIVAROXABAN 10 MG PO TABS
10.0000 mg | ORAL_TABLET | Freq: Every day | ORAL | Status: DC
  Administered 2011-10-09 – 2011-10-10 (×2): 10 mg via ORAL
  Filled 2011-10-08 (×2): qty 1

## 2011-10-08 MED ORDER — NEOSTIGMINE METHYLSULFATE 1 MG/ML IJ SOLN
INTRAMUSCULAR | Status: DC | PRN
  Administered 2011-10-08: 5 mg via INTRAVENOUS

## 2011-10-08 MED ORDER — CEFAZOLIN SODIUM 1-5 GM-% IV SOLN
1.0000 g | Freq: Four times a day (QID) | INTRAVENOUS | Status: AC
  Administered 2011-10-08 – 2011-10-09 (×3): 1 g via INTRAVENOUS
  Filled 2011-10-08 (×3): qty 50

## 2011-10-08 MED ORDER — FENTANYL CITRATE 0.05 MG/ML IJ SOLN
INTRAMUSCULAR | Status: DC | PRN
  Administered 2011-10-08: 50 ug via INTRAVENOUS
  Administered 2011-10-08: 100 ug via INTRAVENOUS
  Administered 2011-10-08 (×2): 50 ug via INTRAVENOUS

## 2011-10-08 MED ORDER — DIPHENHYDRAMINE HCL 12.5 MG/5ML PO ELIX: 12.5000 mg | ORAL_SOLUTION | ORAL | Status: DC | PRN

## 2011-10-08 MED ORDER — ACETAMINOPHEN 10 MG/ML IV SOLN
1000.0000 mg | Freq: Once | INTRAVENOUS | Status: AC
  Administered 2011-10-08: 1000 mg via INTRAVENOUS
  Filled 2011-10-08: qty 100

## 2011-10-08 MED ORDER — METOCLOPRAMIDE HCL 10 MG PO TABS: 5.0000 mg | ORAL_TABLET | Freq: Three times a day (TID) | ORAL | Status: DC | PRN

## 2011-10-08 MED ORDER — OXYCODONE HCL 5 MG PO TABS
5.0000 mg | ORAL_TABLET | ORAL | Status: DC | PRN
  Administered 2011-10-09 – 2011-10-10 (×4): 10 mg via ORAL
  Filled 2011-10-08 (×4): qty 2

## 2011-10-08 MED ORDER — FLEET ENEMA 7-19 GM/118ML RE ENEM: 1.0000 | ENEMA | Freq: Once | RECTAL | Status: AC | PRN

## 2011-10-08 MED ORDER — DOCUSATE SODIUM 100 MG PO CAPS
100.0000 mg | ORAL_CAPSULE | Freq: Two times a day (BID) | ORAL | Status: DC
  Administered 2011-10-08 – 2011-10-10 (×4): 100 mg via ORAL
  Filled 2011-10-08 (×5): qty 1

## 2011-10-08 MED ORDER — FENTANYL CITRATE 0.05 MG/ML IJ SOLN
INTRAMUSCULAR | Status: AC
  Filled 2011-10-08: qty 2

## 2011-10-08 MED ORDER — METOCLOPRAMIDE HCL 5 MG/ML IJ SOLN: 5.0000 mg | Freq: Three times a day (TID) | INTRAMUSCULAR | Status: DC | PRN

## 2011-10-08 MED ORDER — GLYCOPYRROLATE 0.2 MG/ML IJ SOLN
INTRAMUSCULAR | Status: DC | PRN
  Administered 2011-10-08: .6 mg via INTRAVENOUS
  Administered 2011-10-08: 0.2 mg via INTRAVENOUS

## 2011-10-08 MED ORDER — DROPERIDOL 2.5 MG/ML IJ SOLN
INTRAMUSCULAR | Status: DC | PRN
  Administered 2011-10-08: 0.625 mg via INTRAVENOUS

## 2011-10-08 MED ORDER — SUCCINYLCHOLINE CHLORIDE 20 MG/ML IJ SOLN
INTRAMUSCULAR | Status: DC | PRN
  Administered 2011-10-08: 200 mg via INTRAVENOUS

## 2011-10-08 MED ORDER — PROMETHAZINE HCL 25 MG/ML IJ SOLN: 6.2500 mg | INTRAMUSCULAR | Status: DC | PRN

## 2011-10-08 MED ORDER — TEMAZEPAM 15 MG PO CAPS: 15.0000 mg | ORAL_CAPSULE | Freq: Every evening | ORAL | Status: DC | PRN

## 2011-10-08 MED ORDER — LACTATED RINGERS IV SOLN
INTRAVENOUS | Status: DC | PRN
  Administered 2011-10-08 (×3): via INTRAVENOUS

## 2011-10-08 MED ORDER — ONDANSETRON HCL 4 MG/2ML IJ SOLN
INTRAMUSCULAR | Status: DC | PRN
  Administered 2011-10-08: 4 mg via INTRAVENOUS

## 2011-10-08 MED ORDER — SODIUM CHLORIDE 0.9 % IV SOLN: INTRAVENOUS | Status: DC

## 2011-10-08 MED ORDER — LIDOCAINE HCL (CARDIAC) 20 MG/ML IV SOLN
INTRAVENOUS | Status: DC | PRN
  Administered 2011-10-08: 100 mg via INTRAVENOUS

## 2011-10-08 MED ORDER — ONDANSETRON HCL 4 MG PO TABS: 4.0000 mg | ORAL_TABLET | Freq: Four times a day (QID) | ORAL | Status: DC | PRN

## 2011-10-08 MED ORDER — SODIUM CHLORIDE 0.9 % IR SOLN
Status: DC | PRN
  Administered 2011-10-08 (×2): 1000 mL

## 2011-10-08 MED ORDER — BUPIVACAINE LIPOSOME 1.3 % IJ SUSP
20.0000 mL | Freq: Once | INTRAMUSCULAR | Status: AC
  Administered 2011-10-08: 20 mL
  Filled 2011-10-08: qty 20

## 2011-10-08 MED ORDER — ACETAMINOPHEN 10 MG/ML IV SOLN
INTRAVENOUS | Status: AC
  Filled 2011-10-08: qty 100

## 2011-10-08 MED ORDER — KCL IN DEXTROSE-NACL 20-5-0.9 MEQ/L-%-% IV SOLN
INTRAVENOUS | Status: DC
  Administered 2011-10-08: 1000 mL via INTRAVENOUS
  Administered 2011-10-09: 20 mL/h via INTRAVENOUS
  Filled 2011-10-08 (×3): qty 1000

## 2011-10-08 MED ORDER — ACETAMINOPHEN 650 MG RE SUPP: 650.0000 mg | Freq: Four times a day (QID) | RECTAL | Status: DC | PRN

## 2011-10-08 MED ORDER — HYDROCHLOROTHIAZIDE 12.5 MG PO CAPS
12.5000 mg | ORAL_CAPSULE | Freq: Every day | ORAL | Status: DC
  Administered 2011-10-09 – 2011-10-10 (×2): 12.5 mg via ORAL
  Filled 2011-10-08 (×2): qty 1

## 2011-10-08 MED ORDER — ACETAMINOPHEN 325 MG PO TABS: 650.0000 mg | ORAL_TABLET | Freq: Four times a day (QID) | ORAL | Status: DC | PRN

## 2011-10-08 MED ORDER — CEFAZOLIN SODIUM-DEXTROSE 2-3 GM-% IV SOLR
INTRAVENOUS | Status: AC
  Filled 2011-10-08: qty 50

## 2011-10-08 MED ORDER — METHOCARBAMOL 100 MG/ML IJ SOLN
500.0000 mg | Freq: Four times a day (QID) | INTRAVENOUS | Status: DC | PRN
  Filled 2011-10-08: qty 5

## 2011-10-08 MED ORDER — DEXAMETHASONE SODIUM PHOSPHATE 10 MG/ML IJ SOLN
10.0000 mg | Freq: Once | INTRAMUSCULAR | Status: AC
  Administered 2011-10-08: 10 mg via INTRAVENOUS
  Filled 2011-10-08: qty 1

## 2011-10-08 MED ORDER — FENTANYL CITRATE 0.05 MG/ML IJ SOLN
25.0000 ug | INTRAMUSCULAR | Status: DC | PRN
  Administered 2011-10-08: 50 ug via INTRAVENOUS

## 2011-10-08 SURGICAL SUPPLY — 72 items
BAG ZIPLOCK 12X15 (MISCELLANEOUS) ×6 IMPLANT
BANDAGE ELASTIC 6 VELCRO ST LF (GAUZE/BANDAGES/DRESSINGS) ×2 IMPLANT
BANDAGE ESMARK 6X9 LF (GAUZE/BANDAGES/DRESSINGS) ×1 IMPLANT
BIT DRILL 2.8X128 (BIT) ×2 IMPLANT
BLADE EXTENDED COATED 6.5IN (ELECTRODE) ×2 IMPLANT
BLADE SAG 18X100X1.27 (BLADE) ×2 IMPLANT
BLADE SAW SAG 73X25 THK (BLADE) ×1
BLADE SAW SGTL 11.0X1.19X90.0M (BLADE) ×2 IMPLANT
BLADE SAW SGTL 73X25 THK (BLADE) ×1 IMPLANT
BNDG ESMARK 6X9 LF (GAUZE/BANDAGES/DRESSINGS) ×2
CATH KIT ON-Q SILVERSOAK 5IN (CATHETERS) ×2 IMPLANT
CLOTH BEACON ORANGE TIMEOUT ST (SAFETY) ×2 IMPLANT
CONT SPECI 4OZ STER CLIK (MISCELLANEOUS) ×2 IMPLANT
CUFF TOURN SGL QUICK 34 (TOURNIQUET CUFF) ×1
CUFF TRNQT CYL 34X4X40X1 (TOURNIQUET CUFF) ×1 IMPLANT
DRAPE EXTREMITY T 121X128X90 (DRAPE) ×2 IMPLANT
DRAPE INCISE IOBAN 66X45 STRL (DRAPES) ×2 IMPLANT
DRAPE ORTHO SPLIT 77X108 STRL (DRAPES) ×2
DRAPE POUCH INSTRU U-SHP 10X18 (DRAPES) ×2 IMPLANT
DRAPE SURG ORHT 6 SPLT 77X108 (DRAPES) ×2 IMPLANT
DRAPE U-SHAPE 47X51 STRL (DRAPES) ×2 IMPLANT
DRSG ADAPTIC 3X8 NADH LF (GAUZE/BANDAGES/DRESSINGS) ×2 IMPLANT
DRSG EMULSION OIL 3X16 NADH (GAUZE/BANDAGES/DRESSINGS) ×2 IMPLANT
DRSG MEPILEX BORDER 4X4 (GAUZE/BANDAGES/DRESSINGS) ×4 IMPLANT
DRSG MEPILEX BORDER 4X8 (GAUZE/BANDAGES/DRESSINGS) ×2 IMPLANT
DURAPREP 26ML APPLICATOR (WOUND CARE) ×2 IMPLANT
ELECT REM PT RETURN 9FT ADLT (ELECTROSURGICAL) ×2
ELECTRODE REM PT RTRN 9FT ADLT (ELECTROSURGICAL) ×1 IMPLANT
EVACUATOR 1/8 PVC DRAIN (DRAIN) ×2 IMPLANT
FACESHIELD LNG OPTICON STERILE (SAFETY) ×10 IMPLANT
GLOVE BIO SURGEON STRL SZ7.5 (GLOVE) ×2 IMPLANT
GLOVE BIO SURGEON STRL SZ8 (GLOVE) ×2 IMPLANT
GLOVE BIOGEL PI IND STRL 8 (GLOVE) ×2 IMPLANT
GLOVE BIOGEL PI INDICATOR 8 (GLOVE) ×2
GOWN STRL NON-REIN LRG LVL3 (GOWN DISPOSABLE) ×2 IMPLANT
GOWN STRL REIN XL XLG (GOWN DISPOSABLE) ×2 IMPLANT
HANDPIECE INTERPULSE COAX TIP (DISPOSABLE) ×1
HEAD CERAMIC BIOLOX DELTA 36 6 (Hips) ×2 IMPLANT
IMMOBILIZER KNEE 20 (SOFTGOODS) ×2
IMMOBILIZER KNEE 20 THIGH 36 (SOFTGOODS) ×1 IMPLANT
KIT BASIN OR (CUSTOM PROCEDURE TRAY) ×2 IMPLANT
LINER MARATHON NEUT +4X54X36 (Hips) ×2 IMPLANT
MANIFOLD NEPTUNE II (INSTRUMENTS) ×2 IMPLANT
NDL SAFETY ECLIPSE 18X1.5 (NEEDLE) IMPLANT
NEEDLE HYPO 18GX1.5 SHARP (NEEDLE)
NS IRRIG 1000ML POUR BTL (IV SOLUTION) ×2 IMPLANT
PACK TOTAL JOINT (CUSTOM PROCEDURE TRAY) ×2 IMPLANT
PAD ABD 7.5X8 STRL (GAUZE/BANDAGES/DRESSINGS) ×2 IMPLANT
PADDING CAST COTTON 6X4 STRL (CAST SUPPLIES) ×6 IMPLANT
PASSER SUT SWANSON 36MM LOOP (INSTRUMENTS) ×2 IMPLANT
POSITIONER SURGICAL ARM (MISCELLANEOUS) ×2 IMPLANT
SET HNDPC FAN SPRY TIP SCT (DISPOSABLE) ×1 IMPLANT
SPONGE GAUZE 4X4 12PLY (GAUZE/BANDAGES/DRESSINGS) ×2 IMPLANT
SPONGE LAP 18X18 X RAY DECT (DISPOSABLE) ×2 IMPLANT
STAPLER VISISTAT 35W (STAPLE) ×2 IMPLANT
SUCTION FRAZIER 12FR DISP (SUCTIONS) ×2 IMPLANT
SUCTION FRAZIER TIP 10 FR DISP (SUCTIONS) ×2 IMPLANT
SUT ETHIBOND NAB CT1 #1 30IN (SUTURE) ×4 IMPLANT
SUT PDS AB 1 CT1 27 (SUTURE) ×6 IMPLANT
SUT VIC AB 1 CT1 27 (SUTURE) ×3
SUT VIC AB 1 CT1 27XBRD ANTBC (SUTURE) ×3 IMPLANT
SUT VIC AB 2-0 CT1 27 (SUTURE) ×3
SUT VIC AB 2-0 CT1 TAPERPNT 27 (SUTURE) ×3 IMPLANT
SUT VLOC 180 0 24IN GS25 (SUTURE) ×4 IMPLANT
SWAB COLLECTION DEVICE MRSA (MISCELLANEOUS) ×2 IMPLANT
SYR 50ML LL SCALE MARK (SYRINGE) IMPLANT
TOWEL OR 17X26 10 PK STRL BLUE (TOWEL DISPOSABLE) ×4 IMPLANT
TOWER CARTRIDGE SMART MIX (DISPOSABLE) ×2 IMPLANT
TRAY FOLEY CATH 14FRSI W/METER (CATHETERS) ×2 IMPLANT
TUBE ANAEROBIC SPECIMEN COL (MISCELLANEOUS) ×2 IMPLANT
WATER STERILE IRR 1500ML POUR (IV SOLUTION) ×2 IMPLANT
WRAP KNEE MAXI GEL POST OP (GAUZE/BANDAGES/DRESSINGS) ×4 IMPLANT

## 2011-10-08 NOTE — Anesthesia Postprocedure Evaluation (Signed)
Anesthesia Post Note  Patient: [name]  Procedure(s) Performed: Procedure(s) (LRB): TOTAL HIP REVISION (Left)  Anesthesia type: General  Patient location: PACU  Post pain: Pain level controlled  Post assessment: Post-op Vital signs reviewed  Last Vitals:  Filed Vitals:   10/08/11 2000  BP: 141/76  Pulse: 52  Temp: 36.1 C  Resp: 22    Post vital signs: Reviewed  Level of consciousness: sedated  Complications: No apparent anesthesia complications

## 2011-10-08 NOTE — Anesthesia Preprocedure Evaluation (Addendum)
Anesthesia Evaluation    History of Anesthesia Complications (+) PONV and PROLONGED EMERGENCE  Airway Mallampati: III TM Distance: >3 FB Neck ROM: Full    Dental  (+) Teeth Intact and Dental Advisory Given   Pulmonary sleep apnea and Continuous Positive Airway Pressure Ventilation ,  breath sounds clear to auscultation  Pulmonary exam normal       Cardiovascular hypertension, Pt. on medications Rhythm:Regular     Neuro/Psych    GI/Hepatic   Endo/Other  Morbid obesity  Renal/GU      Musculoskeletal   Abdominal (+) + obese,   Peds  Hematology   Anesthesia Other Findings   Reproductive/Obstetrics                          Anesthesia Physical Anesthesia Plan  ASA: III  Anesthesia Plan: General   Post-op Pain Management:    Induction: Intravenous  Airway Management Planned: Oral ETT  Additional Equipment:   Intra-op Plan:   Post-operative Plan: Extubation in OR  Informed Consent: I have reviewed the patients History and Physical, chart, labs and discussed the procedure including the risks, benefits and alternatives for the proposed anesthesia with the patient or authorized representative who has indicated his/her understanding and acceptance.   Dental advisory given  Plan Discussed with: CRNA  Anesthesia Plan Comments:         Anesthesia Quick Evaluation

## 2011-10-08 NOTE — Interval H&P Note (Signed)
History and Physical Interval Note:  10/08/2011 5:21 PM  [name]  has presented today for surgery, with the diagnosis of Left Hip Metallosis  The various methods of treatment have been discussed with the patient and family. After consideration of risks, benefits and other options for treatment, the patient has consented to  Procedure(s) (LRB): TOTAL HIP REVISION (Left) FEMORAL REVISION (Left) as a surgical intervention .  The patients' history has been reviewed, patient examined, no change in status, stable for surgery.  I have reviewed the patients' chart and labs.  Questions were answered to the patient's satisfaction.     Loanne Drilling

## 2011-10-08 NOTE — Anesthesia Procedure Notes (Signed)
Procedure Name: Intubation Date/Time: 10/08/2011 5:27 PM Performed by: Doran Clay Pre-anesthesia Checklist: Patient identified, Timeout performed, Emergency Drugs available, Suction available and Patient being monitored Patient Re-evaluated:Patient Re-evaluated prior to inductionOxygen Delivery Method: Circle system utilized Preoxygenation: Pre-oxygenation with 100% oxygen Intubation Type: IV induction Ventilation: Mask ventilation without difficulty Laryngoscope Size: Mac and 4 Grade View: Grade III Tube size: 8.0 mm Number of attempts: 2 Airway Equipment and Method: Stylet and Bougie stylet Placement Confirmation: ETT inserted through vocal cords under direct vision,  breath sounds checked- equal and bilateral and positive ETCO2 Secured at: 22 cm Tube secured with: Tape Dental Injury: Teeth and Oropharynx as per pre-operative assessment

## 2011-10-08 NOTE — Brief Op Note (Signed)
10/08/2011  6:43 PM  PATIENT:  Margret Chance  61 y.o. male  PRE-OPERATIVE DIAGNOSIS:  Left Hip Metallosis  POST-OPERATIVE DIAGNOSIS:  Left Hip Metallosis  PROCEDURE:  Procedure(s) (LRB): Left hip revision bearing surfaces  SURGEON:  Surgeon(s) and Role:    * Loanne Drilling, MD - Primary  PHYSICIAN ASSISTANT:   ASSISTANTS: Avel Peace, PA-C   ANESTHESIA:   general  EBL:  Total I/O In: 1000 [I.V.:1000] Out: -   BLOOD ADMINISTERED:none  DRAINS: (Medium) Hemovact drain(s) in the left hip with  Suction Open   LOCAL MEDICATIONS USED:  OTHER   Exparel 20 ml   SPECIMEN:  No Specimen  DISPOSITION OF SPECIMEN:  N/A  COUNTS:  YES  TOURNIQUET:  * No tourniquets in log *  DICTATION: .Other Dictation: Dictation Number [id number]  PLAN OF CARE: Admit to inpatient   PATIENT DISPOSITION:  PACU - hemodynamically stable.   Gus Rankin Jolita Haefner, MD    10/08/2011, 6:45 PM

## 2011-10-08 NOTE — Transfer of Care (Signed)
Immediate Anesthesia Transfer of Care Note  Patient: [name]  Procedure(s) Performed: Procedure(s) (LRB): TOTAL HIP REVISION (Left)  Patient Location: PACU  Anesthesia Type: General  Level of Consciousness: awake, alert  and oriented  Airway & Oxygen Therapy: Patient Spontanous Breathing and Patient connected to face mask oxygen  Post-op Assessment: Report given to PACU RN and Post -op Vital signs reviewed and stable  Post vital signs: Reviewed and stable  Complications: No apparent anesthesia complications

## 2011-10-08 NOTE — H&P (View-Only) (Signed)
**Note Lance-Identified via Obfuscation** [name]  DOB: 01/27/1951 Married / Language: English / Race: White / Male  Date of Admission:  10/08/2011  Chief Complaint:  Left Hip Pain  History of Present Illness The patient is a 60 year old male who comes in for a preoperative History and Physical. The patient is scheduled for a left hip poly revision vs. acetabular revision to be performed by Dr. Frank V. Aluisio, MD at [name] on 10/08/2011. The patient is a 60 year old male who presents for follow up of their hip. The patient is being followed for their left hip pain. The patient has not gotten any relief of their symptoms with corticosteroid use. He saw no change at all with the steroids He feels like the hip is progressively worsening. It is mainly the groin pain and lateral pain. He did have fluid on the MARS MRI. 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 He is wanting to go ahead and proceed with revision of the bearing surface. They have been treated conservatively in the past for the above stated problem and despite conservative measures, they continue to have progressive pain and severe functional limitations and dysfunction. They have failed non-operative management including home exercise, medications, and injections. It is felt that they would benefit from undergoing total joint replacement. Risks and benefits of the procedure have been discussed with the patient and they elect to proceed with surgery. There are no active contraindications to surgery such as ongoing infection or rapidly progressive neurological disease.  Allergies No Known Drug Allergies  Medication History Lisinopril (40MG Tablet, Oral) Active. Hydrochlorothiazide (12.5MG Tablet, Oral) Active.  Problem List/Past Medical Pain, Hip (719.45) Past History of C. difficile colitis Sleep Apnea High blood pressure  Past Surgical History Total Hip Replacement. left Total Knee Replacement. right Rotator  Cuff Repair. right Arthroscopy of Knee. bilateral Arthroscopy of Shoulder. bilateral Vasectomy  Family History Heart Disease. mother and father Rheumatoid Arthritis. mother  Social History Exercise. Exercises weekly Illicit drug use. no Living situation. live with spouse Current work status. working full time Drug/Alcohol Rehab (Currently). no Drug/Alcohol Rehab (Previously). no Tobacco / smoke exposure. no Tobacco use. never smoker Marital status. married Number of flights of stairs before winded. 2-3 Pain Contract. no Children. 3 Alcohol use. current drinker; only occasionally per week  Review of Systems General:Not Present- Chills, Fever, Night Sweats, Fatigue, Weight Gain, Weight Loss and Memory Loss. Skin:Not Present- Hives, Itching, Rash, Eczema and Lesions. HEENT:Not Present- Tinnitus, Headache, Double Vision, Visual Loss, Hearing Loss and Dentures. Respiratory:Present- Shortness of breath with exertion. Not Present- Shortness of breath at rest, Allergies, Coughing up blood and Chronic Cough. Cardiovascular:Not Present- Chest Pain, Racing/skipping heartbeats, Difficulty Breathing Lying Down, Murmur, Swelling and Palpitations. Gastrointestinal:Not Present- Bloody Stool, Heartburn, Abdominal Pain, Vomiting, Nausea, Constipation, Diarrhea, Difficulty Swallowing, Jaundice and Loss of appetitie. Male Genitourinary:Not Present- Urinary frequency, Blood in Urine, Weak urinary stream, Discharge, Flank Pain, Incontinence, Painful Urination, Urgency, Urinary Retention and Urinating at Night. Musculoskeletal:Present- Joint Pain. Not Present- Muscle Weakness, Muscle Pain, Joint Swelling, Back Pain, Morning Stiffness and Spasms. Neurological:Not Present- Tremor, Dizziness, Blackout spells, Paralysis, Difficulty with balance and Weakness. Psychiatric:Not Present- Insomnia.  Vitals Weight: 278 lb Height: 70 in Body Surface Area: 2.5 m Body Mass Index:  39.89 kg/m Pulse: 62 (Regular) Resp.: 12 (Unlabored) BP: 146/76 (Sitting, Left Arm, Standard)  Physical Exam The physical exam findings are as follows:  General Mental Status - Alert, cooperative and good historian. General Appearance- pleasant. Not   in acute distress. Orientation- Oriented X3. Build & Nutrition- Well nourished and Well developed.  Head and Neck Head- normocephalic, atraumatic . Neck Global Assessment- supple. no bruit auscultated on the right and no bruit auscultated on the left.  Eye Pupil- Bilateral- Regular and Round. Note: wears glasses Motion- Bilateral- EOMI.  Chest and Lung Exam Auscultation: Breath sounds:- clear at anterior chest wall and - clear at posterior chest wall. Adventitious sounds:- No Adventitious sounds.  Cardiovascular Auscultation:Rhythm- Regular rate and rhythm. Heart Sounds- S1 WNL and S2 WNL. Murmurs & Other Heart Sounds:Auscultation of the heart reveals - No Murmurs.  Abdomen Palpation/Percussion:Tenderness- Abdomen is non-tender to palpation. Rigidity (guarding)- Abdomen is soft. Auscultation:Auscultation of the abdomen reveals - Bowel sounds normal.  Male Genitourinary Not done, not pertinent to present illness  Musculoskeletal On exam he is alert and oriented in no apparent distress. His left hip can be flexed to about 95, rotated in 20, out 30, abducted 30 without discomfort.  Assessment & Plan Prosthetic joint implant failure Left Hip  Patient is for a Left Poly Revision vs. Acetabular Revision by Dr. Aluisio.  Plan is to go home after the surgery.  PCP - Dr. Ronald [facility]  Lance Hebah Bogosian, PA-C  

## 2011-10-09 ENCOUNTER — Encounter (HOSPITAL_COMMUNITY): Payer: Self-pay | Admitting: Surgery

## 2011-10-09 LAB — CBC
HCT: 41.7 % (ref 39.0–52.0)
MCHC: 33.6 g/dL (ref 30.0–36.0)
MCV: 83.4 fL (ref 78.0–100.0)
RDW: 13.1 % (ref 11.5–15.5)

## 2011-10-09 LAB — BASIC METABOLIC PANEL
BUN: 12 mg/dL (ref 6–23)
Creatinine, Ser: 0.84 mg/dL (ref 0.50–1.35)
GFR calc Af Amer: >90 mL/min (ref >90)
GFR calc non Af Amer: >90 mL/min (ref >90)
Potassium: 3.8 mEq/L (ref 3.5–5.1)

## 2011-10-09 NOTE — Op Note (Signed)
NAMECHRISTEN, Andrews NO.:  [id number]  MEDICAL RECORD NO.:  [id number]  LOCATION:  1529                         FACILITY:  [facility]  PHYSICIAN:  Ollen Gross, M.D.    DATE OF BIRTH:  [birth year]  DATE OF PROCEDURE:  10/08/2011 DATE OF DISCHARGE:                              OPERATIVE REPORT   PREOPERATIVE DIAGNOSIS:  Left hip pain secondary to metallosis reaction.  POSTOPERATIVE DIAGNOSIS:  Left hip pain secondary to metallosis reaction.  PROCEDURE:  Left hip bearing surface revision.  SURGEON:  Ollen Gross, M.D.  ASSISTANT:  Alexzandrew L. Perkins, P.A.C.  ANESTHESIA:  General.  ESTIMATED BLOOD LOSS:  Less than 100.  DRAIN:  Hemovac x1.  COMPLICATIONS:  None.  CONDITION:  Stable to Recovery.  BRIEF CLINICAL NOTE:  [name] is a 61 year old male who had a left total hip arthroplasty done almost 2 years ago.  He did very well initially and then over the past 6 months or so, has had progressively worsening pain to the point it feels like it did preoperatively.  He had a metal-on-metal total hip done.  His serum cobalt level was elevated. He had a MARS MRI showing fluid collection adjacent to the hip.  It was felt as though he was possibly having a metallosis reaction and thus, he is taken today for revision of the bearing surface possible total hip revision based on intraoperative findings.  PROCEDURE IN DETAIL:  After successful administration of general anesthetic, patient was placed in the right lateral decubitus position with the left side up and held with the hip positioner.  The left lower extremity was isolated from his perineum with plastic drapes and prepped and draped in a usual sterile fashion.  Previous posterolateral incisions were utilized,  skin cut with a 10 blade through subcutaneous tissue to the level of the fascia lata, which was incised in line with the skin incision.  We did not encounter any fluid underneath the  fascia lata.  The muscle appeared healthy.  The posterior pseudocapsule then excised off the femur and there was some clear fluid present in the joint.  It did not have any type of infectious appearance.  There was no debris present in the fluid, the fluid sent for Gram stain C and S.  I removed some of the pseudocapsule.  Did not have any necrotic appearance to it.  I then dislocated the hip.  The femoral head was removed from the femoral neck.  There was a tiny bit of corrosion at the trunnion, but it was not grossly corroded.  We then retracted the femur anteriorly to get exposure of the acetabulum.  Acetabular components in excellent position.  It was stable with excellent ingrowth into the bone.  The metal liner was then removed from the acetabular shell.  Once again, the component was well-fixed. There was no evidence of any backside wear in the shell.  We then thoroughly irrigated the acetabular shell and cleared the edges of any debris.  A 36-mm neutral +4 marathon liner was then placed.  This was for a 54-mm pinnacle acetabular shell.  The liner was found to be stable.  We then trialed a femoral  head 36+3 and there was a little soft tissue laxity with that although it was very stable on reduction.  I went to 36+6, which effectively eliminated the soft tissue laxity.  The hips reduced with outstanding stability.  He had full extension, full external rotation, 70 degrees flexion, 40 degrees adduction, 90 degrees of internal rotation, 90 degrees of flexion, and 70 degrees of internal rotation.  By placing the left leg on top of the right, I felt as though the leg lengths are equal.  The hip was again dislocated and the trial head removed.  Permanent 36+6 ceramic head was placed onto the femoral neck.  Hips reduced with the same stability parameters.  Wound was copiously irrigated with saline solution and then short rotators and pseudocapsule reattached to the femur through drill  holes with Ethibond suture.  The fascia lata was then closed over Hemovac drain with a running number 1 V-Loc.  Subcu was closed with #1 Vicryl, 2-0 Vicryl, and subcuticular closed with running 4-0 Monocryl.  Prior to this, 20 cc of Exparel mixed with 50 cc of saline were injected into the fascia lata, gluteal muscles, and subcu tissues.  Once completed, the incisions cleaned and dried and Steri-Strips and a bulky sterile dressing applied. He was then placed into a knee immobilizer, awakened, and transported to Recovery in stable condition.     Ollen Gross, M.D.     FA/MEDQ  D:  10/08/2011  T:  10/09/2011  Job:  [id number]

## 2011-10-09 NOTE — Evaluation (Signed)
Physical Therapy Evaluation Patient Details Name: [name] MRN: [id number] DOB: [birth year] Today's Date: 10/09/2011  Problem List:  Patient Active Problem List  Diagnoses  . Pain due to total hip replacement    Past Medical History:  Past Medical History  Diagnosis Date  . PONV (postoperative nausea and vomiting)     slow to wake up after  . Hypertension   . Rotator cuff tear, right dec 2012    physical therapy done, decreased strength  . Sleep apnea     does not know settings, last sleep study 5 yrs ago   Past Surgical History:  Past Surgical History  Procedure Date  . Left total hip replacement 2010  . Right knee replacment 2006 or 2007  . Arthroscopy right knee 15 yrs ago  . Rotator cuff repair     right and left done yrs ago    PT Assessment/Plan/Recommendation PT Assessment Clinical Impression Statement: Pt presents s/p L total hip revision with somewhat decreased strength in LLE and decrease knowledge of precautions.  Pt tolerated ambulation very well at supervision/contact guard level.  Pt will benefit from skilled PT in acute venue to address deficits.  PT recommends HHPT vs no follow up pending pt progress.   PT Recommendation/Assessment: Patient will need skilled PT in the acute care venue PT Problem List: Decreased strength;Decreased balance;Decreased mobility;Decreased knowledge of precautions;Pain Barriers to Discharge: None PT Therapy Diagnosis : Abnormality of gait;Generalized weakness;Acute pain PT Plan PT Frequency: 7X/week PT Treatment/Interventions: DME instruction;Gait training;Stair training;Functional mobility training;Therapeutic activities;Therapeutic exercise;Balance training;Patient/family education PT Recommendation Recommendations for Other Services: OT consult Follow Up Recommendations: No PT follow up;Home health PT Equipment Recommended: Rolling walker with 5" wheels;Cane (RW vs cane pending progress) PT Goals  Acute Rehab PT  Goals PT Goal Formulation: With patient Time For Goal Achievement: 3 days Pt will Ambulate: >150 feet;with modified independence;with least restrictive assistive device PT Goal: Ambulate - Progress: Goal set today Pt will Go Up / Down Stairs: 1-2 stairs;with modified independence;with least restrictive assistive device PT Goal: Up/Down Stairs - Progress: Goal set today Pt will Perform Home Exercise Program: Independently PT Goal: Perform Home Exercise Program - Progress: Goal set today  PT Evaluation Precautions/Restrictions  Precautions Precautions: Posterior Hip Required Braces or Orthoses: No Restrictions Weight Bearing Restrictions: Yes LLE Weight Bearing: Weight bearing as tolerated Prior Functioning  Home Living Lives With: Spouse;Son [name] Help From: Family Type of Home: House Home Layout: Two level;Able to live on main level with bedroom/bathroom Home Access: Stairs to enter Entrance Stairs-Rails: None Entrance Stairs-Number of Steps: 2 Bathroom Shower/Tub: Health visitor: Handicapped height Home Adaptive Equipment: Bedside commode/3-in-1 Prior Function Level of Independence: Independent with basic ADLs;Independent with gait;Independent with transfers Driving: Yes Vocation: Full time employment Cognition Cognition Arousal/Alertness: Awake/alert Overall Cognitive Status: Appears within functional limits for tasks assessed Orientation Level: Oriented X4 Sensation/Coordination Sensation Light Touch: Appears Intact Coordination Gross Motor Movements are Fluid and Coordinated: Yes Extremity Assessment RLE Assessment RLE Assessment: Within Functional Limits LLE Assessment LLE Assessment: Within Functional Limits Mobility (including Balance) Bed Mobility Bed Mobility: Yes Supine to Sit: 6: Modified independent (Device/Increase time) Sit to Supine: 6: Modified independent (Device/Increase time) Transfers Transfers: Yes Sit to Stand: 5:  Supervision;From elevated surface;With upper extremity assist;From bed Sit to Stand Details (indicate cue type and reason): Supervision for safety Stand to Sit: 5: Supervision;With upper extremity assist;To bed;To elevated surface Stand to Sit Details: Supervision for safety. Ambulation/Gait Ambulation/Gait: Yes Ambulation/Gait Assistance: 5: Supervision;4: Min  assist Ambulation/Gait Assistance Details (indicate cue type and reason): Intermittent min/guard for safety. Cues to keep toes pointed straight ahead.  Ambulation Distance (Feet): 400 Feet Assistive device: Rolling walker Gait Pattern: Step-through pattern Gait velocity: WFL Stairs: No Wheelchair Mobility Wheelchair Mobility: No    Exercise    End of Session PT - End of Session Activity Tolerance: Patient tolerated treatment well Patient left: in bed;with call bell in reach;with family/visitor present Nurse Communication: Mobility status for transfers;Mobility status for ambulation General Behavior During Session: [facility] for tasks performed Cognition: [facility] for tasks performed  Page, [name] 10/09/2011, 11:41 AM

## 2011-10-09 NOTE — Progress Notes (Signed)
Received verbalorders from [place] tht foley can be removed Means, Tashianna Broome N 10-09-11 8:58am

## 2011-10-09 NOTE — Progress Notes (Signed)
OT Screen Order received, chart reviewed. Spoke briefly with pt who states he has all necessary DME and AE from previous hip sx and will have prn A upon returning home. Pt presents with no OT needs at this time. Will sign off.  Garrel Ridgel, OTR/L  Pager [phone] 10/09/2011

## 2011-10-09 NOTE — Progress Notes (Signed)
Physical Therapy Treatment Patient Details Name: [name] MRN: [id number] DOB: 24-Sep-1950 Today's Date: 10/09/2011  PT Assessment/Plan  PT - Assessment/Plan Comments on Treatment Session: Pt doing very well with ambulation with cane during pm session.  Will practice stairs in am for D/C.   PT Plan: Discharge plan remains appropriate PT Frequency: 7X/week Recommendations for Other Services: OT consult Follow Up Recommendations: Home health PT Equipment Recommended: Other (comment) (Will only need cane) PT Goals  Acute Rehab PT Goals PT Goal Formulation: With patient Time For Goal Achievement: 3 days Pt will Ambulate: >150 feet;with modified independence;with least restrictive assistive device PT Goal: Ambulate - Progress: Progressing toward goal Pt will Go Up / Down Stairs: 1-2 stairs;with modified independence;with least restrictive assistive device PT Goal: Up/Down Stairs - Progress: Goal set today Pt will Perform Home Exercise Program: Independently PT Goal: Perform Home Exercise Program - Progress: Progressing toward goal  PT Treatment Precautions/Restrictions  Precautions Precautions: Posterior Hip Required Braces or Orthoses: No Restrictions Weight Bearing Restrictions: Yes LLE Weight Bearing: Weight bearing as tolerated Mobility (including Balance) Bed Mobility Bed Mobility: Yes Supine to Sit: 6: Modified independent (Device/Increase time) Sit to Supine: 6: Modified independent (Device/Increase time) Transfers Transfers: Yes Sit to Stand: 5: Supervision;From elevated surface;With upper extremity assist;From bed Sit to Stand Details (indicate cue type and reason): Supervision for safety Stand to Sit: 6: Modified independent (Device/Increase time) Stand to Sit Details: Supervision for safety. Ambulation/Gait Ambulation/Gait: Yes Ambulation/Gait Assistance: 4: Min assist Ambulation/Gait Assistance Details (indicate cue type and reason): Min/guard for safety due  to amb with straight cane.  Cues for sequencing.  Ambulation Distance (Feet): 400 Feet Assistive device: Straight cane Gait Pattern: Step-through pattern Gait velocity: WFL Stairs: No Wheelchair Mobility Wheelchair Mobility: No    Exercise  Total Joint Exercises Hip ABduction/ADduction: AROM;Left;10 reps;Standing Marching in Standing: AROM;Left;10 reps;Standing Other Exercises Other Exercises: Heel raises x 20 reps B LE Other Exercises: Side stepping x 20 reps End of Session PT - End of Session Activity Tolerance: Patient tolerated treatment well Patient left: in bed;with call bell in reach;with family/visitor present Nurse Communication: Mobility status for transfers;Mobility status for ambulation General Behavior During Session: [facility] for tasks performed Cognition: [facility] for tasks performed  Page, [name] 10/09/2011, 2:52 PM

## 2011-10-09 NOTE — Progress Notes (Signed)
Subjective: 1 Day Post-Op Procedure(s) (LRB): TOTAL HIP REVISION (Left) Patient reports pain as mild.   Patient seen in rounds with Dr. Lequita Halt. Patient has complaints of some soreness.  Wife in room at this time. Patient may be WBAT. We will start therapy today. Plan is to go home after hospital stay.  Objective: Vital signs in last 24 hours: Temp:  [97 F (36.1 C)-98 F (36.7 C)] 97.8 F (36.6 C) (03/28 0615) Pulse Rate:  [52-64] 56  (03/28 0615) Resp:  [10-24] 18  (03/28 0615) BP: (119-170)/(59-89) 148/81 mmHg (03/28 0615) SpO2:  [90 %-100 %] 97 % (03/28 0615)  Intake/Output from previous day:  Intake/Output Summary (Last 24 hours) at 10/09/11 0757 Last data filed at 10/09/11 0600  Gross per 24 hour  Intake   4210 ml  Output   2495 ml  Net   1715 ml    Intake/Output this shift:    Labs:  Basename 10/09/11 0432  HGB 14.0    Basename 10/09/11 0432  WBC 9.6  RBC 5.00  HCT 41.7  PLT 169    Basename 10/09/11 0432  NA 135  K 3.8  CL 99  CO2 28  BUN 12  CREATININE 0.84  GLUCOSE 158*  CALCIUM 8.6   No results found for this basename: LABPT:2,INR:2 in the last 72 hours  Exam - Neurovascular intact Sensation intact distally Dressing - clean, dry, no drainage Motor function intact - moving foot and toes well on exam.  Hemovac pulled without difficulty.  Past Medical History  Diagnosis Date  . PONV (postoperative nausea and vomiting)     slow to wake up after  . Hypertension   . Rotator cuff tear, right dec 2012    physical therapy done, decreased strength  . Sleep apnea     does not know settings, last sleep study 5 yrs ago    Assessment/Plan: 1 Day Post-Op Procedure(s) (LRB): TOTAL HIP REVISION (Left) Principal Problem:  *Pain due to total hip replacement   Advance diet Up with therapy D/C IV fluids Plan for discharge tomorrow Discharge home with home health  DVT Prophylaxis - Xarelto  Protocol Weight Bearing As Tolerated left Leg D/C  Knee Immobilizer Hemovac Pulled Begin Therapy Hip Preacutions Keep foley until tomorrow. No vaccines.  PERKINS, ALEXZANDREW 10/09/2011, 7:57 AM

## 2011-10-10 LAB — CBC
MCHC: 33.2 g/dL (ref 30.0–36.0)
RDW: 13.6 % (ref 11.5–15.5)

## 2011-10-10 LAB — BASIC METABOLIC PANEL
GFR calc Af Amer: >90 mL/min (ref >90)
GFR calc non Af Amer: >90 mL/min (ref >90)
Potassium: 3.5 mEq/L (ref 3.5–5.1)
Sodium: 138 mEq/L (ref 135–145)

## 2011-10-10 MED ORDER — METHOCARBAMOL 500 MG PO TABS: 500.0000 mg | ORAL_TABLET | Freq: Four times a day (QID) | ORAL | Status: DC | PRN

## 2011-10-10 MED ORDER — OXYCODONE HCL 5 MG PO TABS: 5.0000 mg | ORAL_TABLET | ORAL | Status: AC | PRN

## 2011-10-10 MED ORDER — METHOCARBAMOL 500 MG PO TABS: 500.0000 mg | ORAL_TABLET | Freq: Four times a day (QID) | ORAL | Status: AC | PRN

## 2011-10-10 MED ORDER — RIVAROXABAN 10 MG PO TABS: 10.0000 mg | ORAL_TABLET | Freq: Every day | ORAL | Status: DC

## 2011-10-10 NOTE — Progress Notes (Signed)
CARE MANAGEMENT NOTE 10/10/2011  Patient:  [name], [name]   Account Number:  [id number]  Date Initiated:  10/09/2011  Documentation initiated by:  Raphaela Cannaday  Subjective/Objective Assessment:   61 yo male admitted 10/08/11 with left hip metallosis     Action/Plan:   D/C when medically stable   Anticipated DC Date:  10/12/2011   Anticipated DC Plan:  HOME W HOME HEALTH SERVICES      DC Planning Services  CM consult      [facility] Choice  HOME HEALTH  DURABLE MEDICAL EQUIPMENT   Choice offered to / List presented to:  C-1 Patient   DME arranged  CANE      DME agency  [facility]     HH arranged  HH-2 PT      [facility] agency  [facility]   Status of service:  Completed, signed off   Discharge Disposition:  HOME W HOME HEALTH SERVICES  Comments:  10/10/11, Kathi Der RNC-MNN, BSN, [phone], CM received referral for DME.  Lucretia at [facility] contacted with DME order and confirmation of order received.  H&P, order, OP note, and discharge summary faxed to [facility] at [phone]. 10/09/11, Kathi Der RNC-MNN, BSN, [phone], CM received referral.  CM met with pt.  Pt states he will be using [facility] for [facility] services.  Debarah Crape contacted at [phone] with [facility] orders and confirmation of services received.  Pt states he will have assistance at home when he is discharged.  Will follow.

## 2011-10-10 NOTE — Progress Notes (Signed)
Subjective: 2 Days Post-Op Procedure(s) (LRB): TOTAL HIP REVISION (Left) Patient reports pain as mild.   Patient seen in rounds with Dr. Lequita Halt. Patient doing well and ready to go.  Objective: Vital signs in last 24 hours: Temp:  [97.6 F (36.4 C)-97.8 F (36.6 C)] 97.8 F (36.6 C) (03/29 0604) Pulse Rate:  [62-65] 64  (03/29 0604) Resp:  [18-20] 20  (03/29 0604) BP: (152-178)/(76-94) 152/90 mmHg (03/29 0604) SpO2:  [93 %-96 %] 96 % (03/29 0604) Weight:  [127.551 kg (281 lb 3.2 oz)] 127.551 kg (281 lb 3.2 oz) (03/28 1101)  Intake/Output from previous day:  Intake/Output Summary (Last 24 hours) at 10/10/11 0849 Last data filed at 10/10/11 0735  Gross per 24 hour  Intake   1680 ml  Output   1450 ml  Net    230 ml    Intake/Output this shift: Total I/O In: 240 [P.O.:240] Out: 300 [Urine:300]  Labs:  [facility] 10/10/11 0442 10/09/11 0432  HGB 13.4 14.0    Basename 10/10/11 0442 10/09/11 0432  WBC 10.9* 9.6  RBC 4.74 5.00  HCT 40.4 41.7  PLT 151 169    Basename 10/10/11 0442 10/09/11 0432  NA 138 135  K 3.5 3.8  CL 101 99  CO2 32 28  BUN 18 12  CREATININE 0.94 0.84  GLUCOSE 108* 158*  CALCIUM 8.6 8.6   No results found for this basename: LABPT:2,INR:2 in the last 72 hours  Exam: Neurovascular intact Sensation intact distally Incision - clean, dry, no drainage Motor function intact - moving foot and toes well on exam.   Assessment/Plan: 2 Days Post-Op Procedure(s) (LRB): TOTAL HIP REVISION (Left) Procedure(s) (LRB): TOTAL HIP REVISION (Left) Past Medical History  Diagnosis Date  . PONV (postoperative nausea and vomiting)     slow to wake up after  . Hypertension   . Rotator cuff tear, right dec 2012    physical therapy done, decreased strength  . Sleep apnea     does not know settings, last sleep study 5 yrs ago   Principal Problem:  *Pain due to total hip replacement   Advance diet Up with therapy Discharge home with home health Diet -  heart healthy Follow up - in 2 weeks Activity - WBAT Condition Upon Discharge - Good D/C Meds - See DC Summary DVT Prophylaxis - Xarelto Protocol   Howard Patton 10/10/2011, 8:49 AM

## 2011-10-10 NOTE — Progress Notes (Signed)
Physical Therapy Treatment Patient Details Name: [name] MRN: [id number] DOB: 12/19/1950 Today's Date: 10/10/2011  PT Assessment/Plan  PT - Assessment/Plan Comments on Treatment Session: Pt continues to progress very well with ambulation with cane.  Pt verbalized/demonstrated understanding of stair negotiation and 3/3 hip precautions.  Ready for D/C PT Plan: Discharge plan remains appropriate PT Frequency: 7X/week Follow Up Recommendations: Home health PT Equipment Recommended: Cane PT Goals  Acute Rehab PT Goals PT Goal Formulation: With patient Time For Goal Achievement: 3 days Pt will Ambulate: >150 feet;with modified independence;with least restrictive assistive device PT Goal: Ambulate - Progress: Met Pt will Go Up / Down Stairs: 1-2 stairs;with modified independence;with least restrictive assistive device PT Goal: Up/Down Stairs - Progress: Partly met Pt will Perform Home Exercise Program: Independently PT Goal: Perform Home Exercise Program - Progress: Met  PT Treatment Precautions/Restrictions  Precautions Precautions: Posterior Hip Required Braces or Orthoses: No Restrictions Weight Bearing Restrictions: No LLE Weight Bearing: Weight bearing as tolerated Mobility (including Balance) Bed Mobility Bed Mobility: No (Pt was already in recliner when PT arrived) Transfers Transfers: Yes Sit to Stand: 6: Modified independent (Device/Increase time) Stand to Sit: 6: Modified independent (Device/Increase time) Ambulation/Gait Ambulation/Gait: Yes Ambulation/Gait Assistance: 6: Modified independent (Device/Increase time) Assistive device: Straight cane Gait Pattern: Step-through pattern Gait velocity: WFL Stairs: Yes Stairs Assistance: 5: Supervision Stairs Assistance Details (indicate cue type and reason): Supervision for safety with pt demonstrating correct technique.  Stair Management Technique: One rail Right;Step to pattern;Forwards;With cane Number of  Stairs: 6  Height of Stairs: 6     Exercise  Total Joint Exercises Hip ABduction/ADduction: AROM;Left;10 reps;Standing Marching in Standing: AROM;Left;10 reps;Standing Other Exercises Other Exercises: Side stepping x 20 reps End of Session PT - End of Session Activity Tolerance: Patient tolerated treatment well Patient left: in chair;with call bell in reach General Behavior During Session: [facility] for tasks performed Cognition: [name] for tasks performed  [name], [name] 10/10/2011, 9:30 AM

## 2011-10-10 NOTE — Discharge Instructions (Signed)
Hip Rehabilitation, Guidelines Following Surgery The results of a hip operation are greatly improved after range of motion and muscle strengthening exercises. Follow all safety measures which are given to protect your hip. If any of these exercises cause increased pain or swelling in your joint, decrease the amount until you are comfortable again. Then slowly increase the exercises. Call your caregiver if you have problems or questions. HOME CARE INSTRUCTIONS  Most of the following instructions are designed to prevent the dislocation of your new hip.  Do not put on socks or shoes without following the instructions of your caregivers.   Sit on high chairs so your hips are not bent more than 90 degrees.   Sit on chairs with arms. Use the chair arms to help push yourself up when arising.   Keep your leg on the side of the operation out in front of you when standing up.   Arrange for the use of a toilet seat elevator so you are not sitting low.   Do not do any exercises or get in any positions that cause your toes to point in (pigeon toed).   Always sleep with a pillow between your legs. Do not lie on your side in sleep with both knees touching the bed.   You may resume a sexual relationship in one month or when given the OK by your caregiver.   Use crutches or walker as long as suggested by your caregivers.   Begin weight bearing with your caregiver's approval.   Avoid periods of inactivity such as sitting longer than an hour when not asleep. This helps prevent blood clots.   Return to work as instructed by your caregiver.   Do not drive a car for 6 weeks or as instructed.   Do not drive while taking narcotics.   Wear elastic stockings until instructed not to.   Make sure you keep all of your appointments after your operation with all of your doctors and caregivers.  RANGE OF MOTION AND STRENGTHENING EXERCISES These exercises are designed to help you keep full movement of your hip  joint. Follow your caregiver's or physical therapist's instructions. Perform all exercises about fifteen times, three times per day or as directed. Exercise both hips, even if you have had only one joint replacement. These exercises can be done on a training (exercise) mat, on the floor, on a table or on a bed. Use whatever works the best and is most comfortable for you. Use music or television while you are exercising so that the exercises are a pleasant break in your day. This will make your life better with the exercises acting as a break in routine you can look forward to.  Lying on your back, slowly slide your foot toward your buttocks, raising your knee up off the floor. Then slowly slide your foot back down until your leg is straight again.   Lying on your back spread your legs as far apart as you can without causing discomfort.   Lying on your side, raise your upper leg and foot straight up from the floor as far as is comfortable. Slowly lower the leg and repeat.   Lying on your back, tighten up the muscle in the front of your thigh (quadriceps muscles). You can do this by keeping your leg straight and trying to raise your heel off the floor. This helps strengthen the largest muscle supporting your knee.   Lying on your back, tighten up the muscles of your buttocks both   with the legs straight and with the knee bent at a comfortable angle while keeping your heel on the floor.  Document Released: 02/01/2004 Document Revised: 06/19/2011 Document Reviewed: 01/19/2008 [employer] Patient Information 2012 [employer]  Pick up stool softner and laxative for home. Do not submerge incision under water. May shower. Continue to use ice for pain and swelling from surgery. Hip precautions.  Total Hip Protocol. 

## 2011-10-13 LAB — ANAEROBIC CULTURE

## 2011-10-17 ENCOUNTER — Encounter (HOSPITAL_COMMUNITY): Payer: Self-pay | Admitting: Orthopedic Surgery

## 2011-10-28 NOTE — Discharge Summary (Signed)
Physician Discharge Summary   Patient ID: [name] MRN: [id number] DOB/AGE: Jan 11, 1951 61 y.o.  Admit date: 10/08/2011 Discharge date: 10/10/2011  Primary Diagnosis:Prosthetic joint implant failure Left Hip  Admission Diagnoses: Past Medical History  Diagnosis Date  . PONV (postoperative nausea and vomiting)     slow to wake up after  . Hypertension   . Rotator cuff tear, right dec 2012    physical therapy done, decreased strength  . Sleep apnea     does not know settings, last sleep study 5 yrs ago    Discharge Diagnoses:  Principal Problem:  *Pain due to total hip replacement   Procedure: Procedure(s) (LRB): TOTAL HIP REVISION (Left)   Consults: None  HPI: [name] is a 61 year old male who had a left  total hip arthroplasty done almost 2 years ago. He did very well initially and then over the past 6 months or so, has had progressively worsening pain to the point it feels like it did preoperatively. He had a metal-on-metal total hip done. His serum cobalt level was elevated. He had a MARS MRI showing fluid collection adjacent to the hip. It was felt as though he was possibly having a metallosis reaction and thus, he is taken today for revision of the bearing surface possible total hip revision based on intraoperative findings.  Laboratory Data: Hospital Outpatient Visit on 10/01/2011  Component Date Value Range Status  . aPTT (seconds) 10/01/2011 29  24-37 Final  . WBC (K/uL) 10/01/2011 9.2  4.0-10.5 Final  . RBC (MIL/uL) 10/01/2011 5.63  4.22-5.81 Final  . Hemoglobin (g/dL) 40/98/1191 47.8  29.5-62.1 Final  . HCT (%) 10/01/2011 47.1  39.0-52.0 Final  . MCV (fL) 10/01/2011 83.7  78.0-100.0 Final  . MCH (pg) 10/01/2011 28.2  26.0-34.0 Final  . MCHC (g/dL) 30/86/5784 69.6  29.5-28.4 Final  . RDW (%) 10/01/2011 13.3  11.5-15.5 Final  . Platelets (K/uL) 10/01/2011 177  150-400 Final  . Sodium (mEq/L) 10/01/2011 140  135-145 Final  . Potassium (mEq/L) 10/01/2011  3.7  3.5-5.1 Final  . Chloride (mEq/L) 10/01/2011 101  96-112 Final  . CO2 (mEq/L) 10/01/2011 30  19-32 Final  . Glucose, Bld (mg/dL) 13/24/4010 79  27-25 Final  . BUN (mg/dL) 36/64/4034 14  7-42 Final  . Creatinine, Ser (mg/dL) 59/56/3875 6.43  3.29-5.18 Final  . Calcium (mg/dL) 84/16/6063 9.4  0.1-60.1 Final  . Total Protein (g/dL) 09/32/3557 7.3  3.2-2.0 Final  . Albumin (g/dL) 25/42/7062 4.2  3.7-6.2 Final  . AST (U/L) 10/01/2011 21  0-37 Final  . ALT (U/L) 10/01/2011 25  0-53 Final  . Alkaline Phosphatase (U/L) 10/01/2011 59  39-117 Final  . Total Bilirubin (mg/dL) 83/15/1761 0.5  6.0-7.3 Final  . GFR calc non Af Amer (mL/min) 10/01/2011 >90  >90 Final  . GFR calc Af Amer (mL/min) 10/01/2011 >90  >90 Final   Comment:                                 The eGFR has been calculated                          using the CKD EPI equation.                          This calculation has not been  validated in all clinical                          situations.                          eGFR's persistently                          <90 mL/min signify                          possible Chronic Kidney Disease.  Marland Kitchen Prothrombin Time (seconds) 10/01/2011 13.0  11.6-15.2 Final  . INR  10/01/2011 0.96  0.00-1.49 Final  . Color, Urine  10/01/2011 YELLOW  YELLOW Final  . APPearance  10/01/2011 CLEAR  CLEAR Final  . Specific Gravity, Urine  10/01/2011 1.019  1.005-1.030 Final  . pH  10/01/2011 7.0  5.0-8.0 Final  . Glucose, UA (mg/dL) 45/40/9811 NEGATIVE  NEGATIVE Final  . Hgb urine dipstick  10/01/2011 NEGATIVE  NEGATIVE Final  . Bilirubin Urine  10/01/2011 NEGATIVE  NEGATIVE Final  . Ketones, ur (mg/dL) 91/47/8295 NEGATIVE  NEGATIVE Final  . Protein, ur (mg/dL) 62/13/0865 NEGATIVE  NEGATIVE Final  . Urobilinogen, UA (mg/dL) 78/46/9629 0.2  5.2-8.4 Final  . Nitrite  10/01/2011 NEGATIVE  NEGATIVE Final  . Leukocytes, UA  10/01/2011 NEGATIVE  NEGATIVE Final   MICROSCOPIC NOT DONE  ON URINES WITH NEGATIVE PROTEIN, BLOOD, LEUKOCYTES, NITRITE, OR GLUCOSE <1000 mg/dL.  Marland Kitchen MRSA, PCR  10/01/2011 NEGATIVE  NEGATIVE Final  . Staphylococcus aureus  10/01/2011 NEGATIVE  NEGATIVE Final   Comment:                                 The Xpert SA Assay (FDA                          approved for NASAL specimens                          only), is one component of                          a comprehensive surveillance                          program.  It is not intended                          to diagnose infection nor to                          guide or monitor treatment.   No results found for this basename: HGB:5 in the last 72 hours No results found for this basename: WBC:2,RBC:2,HCT:2,PLT:2 in the last 72 hours No results found for this basename: NA:2,K:2,CL:2,CO2:2,BUN:2,CREATININE:2,GLUCOSE:2,CALCIUM:2 in the last 72 hours No results found for this basename: LABPT:2,INR:2 in the last 72 hours  X-Rays:Dg Chest 2 View  10/01/2011  *RADIOLOGY REPORT*  Clinical Data: 61 year old male preoperative study for left hip surgery.  Hypertension.  CHEST - 2 VIEW  Comparison: 08/13/2009 and earlier.  Findings: Stable lung volumes.  Cardiac size and mediastinal  contours are within normal limits.  Visualized tracheal air column is within normal limits.  The lungs remain clear.  No pneumothorax or effusion. No acute osseous abnormality identified.  IMPRESSION: No acute cardiopulmonary abnormality.  Original Report Authenticated By: Harley Hallmark, M.D.   Dg Hip Complete Left  10/01/2011  *RADIOLOGY REPORT*  Clinical Data: Preop for left total hip revision  LEFT HIP - COMPLETE 2+ VIEW  Comparison: Portable left hip film of 02/28/2009  Findings: The femoral and acetabular components of the left hip replacement are in good position.  No acute abnormality is seen. Hypertrophic bone surrounds the left hip joint space.  The pelvic rami are intact.  The SI joints appear normal.  IMPRESSION: The left total  hip replacement appears stable.  No acute abnormality  Original Report Authenticated By: Juline Patch, M.D.   Dg Pelvis Portable  10/08/2011  *RADIOLOGY REPORT*  Clinical Data: Postop left total hip  PORTABLE PELVIS  Comparison: 02/28/2009, 10/01/2011  Findings: Left hip replacement has a similar appearance to the prior study. No immediate complication.  No fracture.  IMPRESSION: Satisfactory left hip replacement.  Original Report Authenticated By: Camelia Phenes, M.D.    EKG:No orders found for this or any previous visit.   Hospital Course: Patient was admitted to [facility] and taken to the OR and underwent the above state procedure without complications.  Patient tolerated the procedure well and was later transferred to the recovery room and then to the orthopaedic floor for postoperative care.  They were given PO and IV analgesics for pain control following their surgery.  They were given 24 hours of postoperative antibiotics and started on DVT prophylaxis in the form of Xarelto.   PT and OT were ordered for total hip protocol.  The patient was allowed to be FWB with therapy. Discharge planning was consulted to help with postop disposition and equipment needs.  Patient had a good night on the evening of surgery and started to get up OOB with therapy on day one.  Hemovac drain was pulled without difficulty.  The knee immobilizer was removed and discontinued.  Continued to work with therapy into day two.  Dressing was changed on day two and the incision was healing well. Patient was seen in rounds and was ready to go home.    Discharge Medications: Prior to Admission medications   Medication Sig Start Date End Date Taking? Authorizing Provider  hydrochlorothiazide (MICROZIDE) 12.5 MG capsule Take 12.5 mg by mouth every morning.   Yes Historical Provider, MD  lisinopril (PRINIVIL,ZESTRIL) 40 MG tablet Take 40 mg by mouth every morning.   Yes Historical Provider, MD  rivaroxaban (XARELTO)  10 MG TABS tablet Take 1 tablet (10 mg total) by mouth daily with breakfast. 10/10/11   Delorus Langwell Julien Girt, PA    Diet: heart healthy  Activity:WBAT No bending hip over 90 degrees- A "L" Angle Do not cross legs Do not let foot roll inward  When turning these patients a pillow should be placed between the patient's legs to prevent crossing.  Patients should have the affected knee fully extended when trying to sit or stand from all surfaces to prevent excessive hip flexion.  When ambulating and turning toward the affected side the affected leg should have the toes turned out prior to moving the walker and the rest of patient's body as to prevent internal rotation/ turning in of the leg.  Abduction pillows are the most effective way to prevent a patient from not  crossing legs or turning toes in at rest. If an abduction pillow is not ordered placing a regular pillow length wise between the patient's legs is also an effective reminder.  It is imperative that these precautions be maintained so that the surgical hip does not dislocate.    Follow-up:in 2 weeks  Disposition: Home  Discharged Condition: good   Discharge Orders    Future Orders Please Complete By Expires   Diet - low sodium heart healthy      Call MD / Call 911      Comments:   If you experience chest pain or shortness of breath, CALL 911 and be transported to the hospital emergency room.  If you develope a fever above 101 F, pus (white drainage) or increased drainage or redness at the wound, or calf pain, call your surgeon's office.   Constipation Prevention      Comments:   Drink plenty of fluids.  Prune juice may be helpful.  You may use a stool softener, such as Colace (over the counter) 100 mg twice a day.  Use MiraLax (over the counter) for constipation as needed.   Increase activity slowly as tolerated      Weight Bearing as taught in Physical Therapy      Comments:   Use a walker or crutches as instructed.    Discharge instructions      Comments:   *Pick up stool softner and laxative for home. Do not submerge incision under water. May shower. Continue to use ice for pain and swelling from surgery. Hip precautions.  Total Hip Protocol.    Driving restrictions      Comments:   No driving   Lifting restrictions      Comments:   No lifting   Follow the hip precautions as taught in Physical Therapy      Change dressing      Comments:   You may change your dressing dressing daily with sterile 4 x 4 inch gauze dressing and paper tape.   TED hose      Comments:   Use stockings (TED hose) for 3 weeks on both leg(s).  You may remove them at night for sleeping.     Medication List  As of 10/28/2011 10:31 PM   TAKE these medications         hydrochlorothiazide 12.5 MG capsule   Commonly known as: MICROZIDE   Take 12.5 mg by mouth every morning.      lisinopril 40 MG tablet   Commonly known as: PRINIVIL,ZESTRIL   Take 40 mg by mouth every morning.      rivaroxaban 10 MG Tabs tablet   Commonly known as: XARELTO   Take 1 tablet (10 mg total) by mouth daily with breakfast.           Follow-up Information    Follow up with Loanne Drilling, MD. Schedule an appointment as soon as possible for a visit in 2 weeks.   Contact information:   [facility] [address], [address] [place] [address] [phone]          Signed: Patrica Duel 10/28/2011, 10:31 PM

## 2012-03-28 ENCOUNTER — Other Ambulatory Visit: Payer: Self-pay | Admitting: Orthopedic Surgery

## 2012-03-28 MED ORDER — BUPIVACAINE LIPOSOME 1.3 % IJ SUSP: 20.0000 mL | Freq: Once | INTRAMUSCULAR | Status: DC

## 2012-03-28 MED ORDER — DEXAMETHASONE SODIUM PHOSPHATE 10 MG/ML IJ SOLN: 10.0000 mg | Freq: Once | INTRAMUSCULAR | Status: DC

## 2012-03-28 NOTE — Progress Notes (Signed)
Preoperative surgical orders have been place into the Epic hospital system for [name] on 03/28/2012, 9:02 PM  by Patrica Duel for surgery on 04/09/2012.  Preop Total Hip orders including Experel Injecion, IV Tylenol, and IV Decadron as long as there are no contraindications to the above medications. Avel Peace, PA-C

## 2012-03-31 ENCOUNTER — Encounter (HOSPITAL_COMMUNITY): Payer: Self-pay | Admitting: Pharmacy Technician

## 2012-04-05 ENCOUNTER — Encounter (HOSPITAL_COMMUNITY): Payer: Self-pay

## 2012-04-05 ENCOUNTER — Encounter (HOSPITAL_COMMUNITY)
Admission: RE | Admit: 2012-04-05 | Discharge: 2012-04-05 | Disposition: A | Discharge status: Home / Self Care | Payer: 59 | Source: Ambulatory Visit | Attending: Orthopedic Surgery | Admitting: Orthopedic Surgery

## 2012-04-05 ENCOUNTER — Ambulatory Visit (HOSPITAL_COMMUNITY)
Admission: RE | Admit: 2012-04-05 | Discharge: 2012-04-05 | Disposition: A | Discharge status: Home / Self Care | Payer: 59 | Source: Ambulatory Visit | Attending: Orthopedic Surgery | Admitting: Orthopedic Surgery

## 2012-04-05 DIAGNOSIS — T84029A Dislocation of unspecified internal joint prosthesis, initial encounter: Secondary | ICD-10-CM | POA: Insufficient documentation

## 2012-04-05 DIAGNOSIS — Z01812 Encounter for preprocedural laboratory examination: Secondary | ICD-10-CM | POA: Insufficient documentation

## 2012-04-05 DIAGNOSIS — Z96649 Presence of unspecified artificial hip joint: Secondary | ICD-10-CM | POA: Insufficient documentation

## 2012-04-05 DIAGNOSIS — Z01810 Encounter for preprocedural cardiovascular examination: Secondary | ICD-10-CM | POA: Insufficient documentation

## 2012-04-05 DIAGNOSIS — X58XXXA Exposure to other specified factors, initial encounter: Secondary | ICD-10-CM | POA: Insufficient documentation

## 2012-04-05 LAB — SURGICAL PCR SCREEN
MRSA, PCR: NEGATIVE
Staphylococcus aureus: NEGATIVE

## 2012-04-05 LAB — COMPREHENSIVE METABOLIC PANEL
ALT: 21 U/L (ref 0–53)
AST: 18 U/L (ref 0–37)
Albumin: 4 g/dL (ref 3.5–5.2)
Alkaline Phosphatase: 54 U/L (ref 39–117)
BUN: 15 mg/dL (ref 6–23)
Potassium: 3.8 mEq/L (ref 3.5–5.1)
Sodium: 138 mEq/L (ref 135–145)
Total Protein: 6.8 g/dL (ref 6.0–8.3)

## 2012-04-05 LAB — URINALYSIS, ROUTINE W REFLEX MICROSCOPIC
Hgb urine dipstick: NEGATIVE
Nitrite: NEGATIVE
Specific Gravity, Urine: 1.017 (ref 1.005–1.030)
Urobilinogen, UA: 0.2 mg/dL (ref 0.0–1.0)

## 2012-04-05 LAB — CBC
MCHC: 33.7 g/dL (ref 30.0–36.0)
Platelets: 202 10*3/uL (ref 150–400)
RDW: 13.3 % (ref 11.5–15.5)

## 2012-04-05 LAB — PROTIME-INR: Prothrombin Time: 12.9 seconds (ref 11.6–15.2)

## 2012-04-05 LAB — APTT: aPTT: 29 seconds (ref 24–37)

## 2012-04-05 NOTE — Pre-Procedure Instructions (Signed)
Chest x ray 3/13  EPIC

## 2012-04-05 NOTE — Patient Instructions (Addendum)
20 BOND GOLDFINE  04/05/2012   Your procedure is scheduled on:  04/09/12  Friday  Surgery  0730-0900  Report to [facility] at  0515     AM.  Call this number if you have problems the morning of surgery: [phone]     Or PST   [id number]  [name]   Remember: BRING CPAP MASK WITH YOU WITH TUBING TO HOSPITAL  Do not eat food  Or drink any fluids :After Midnight.  Thursday NIGHT  :     Take these medicines the morning of surgery with A SIP OF WATER:  NONE   Do not wear jewelry, make-up or nail polish.  Do not wear lotions, powders, or perfumes. You may wear deodorant.  Do not shave 48 hours prior to surgery.  Do not bring valuables to the hospital.  Contacts, dentures or bridgework may not be worn into surgery.  Leave suitcase in the car. After surgery it may be brought to your room.  For patients admitted to the hospital, checkout time is 11:00 AM the day of discharge.   Patients discharged the day of surgery will not be allowed to drive home  Name and phone number of your driver:                                                                      Special Instructions: CHG Shower Use Special Wash:      SEE SEPARATE INSTR UCTION SHEET REGULAR SOAP FACE AND PRIVATES                         MEN-MAY SHAVE FACE MORNING OF SURGERY  Please read over the following fact sheets that you were given: MRSA Information

## 2012-04-08 ENCOUNTER — Other Ambulatory Visit: Payer: Self-pay | Admitting: Orthopedic Surgery

## 2012-04-08 NOTE — H&P (Signed)
[name]  DOB: [birth year] Married / Language: English / Race: White Male  Date of Admission:  04/09/2012  Chief Complaint:  Left Hip Instability  History of Present Illness The patient is a 61 year old male who comes in today for a preoperative History and Physical. The patient is scheduled for a left acetabular revision vs constrained liner to be performed by Dr. Gus Rankin. Aluisio, MD at [facility] on Friday April 09, 2012 . He has had four dislocations since his latest surgery. One went in on its own. Another was put back in at [facility]. The third was when he was on a cruise and they put it back in. The most recent was while on was on a business trip in [place]. He is dislocating during simple activities like sitting down. His primary total hip failed due to complications with metal on metal. This second total hip is failing as a result of instability causing repeated dislocations. He does not have pain except for musle soreness that occurs for a few days after a dislocation. Due to failure of the left total hip, he needs an acetabular revision vs placement of a constrained liner.   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 difficile colitis (008.45). Past History   Allergies Codeine/Codeine Derivatives. Rash.   Family History Heart Disease. mother and father Rheumatoid Arthritis. mother   Social History Tobacco use. never smoker Tobacco / smoke exposure. no Drug/Alcohol Rehab (Previously). no Pain Contract. no Number of flights of stairs before winded. 2-3 Marital status. married Drug/Alcohol Rehab (Currently). no Exercise. Exercises weekly Current work status. working full time Living situation. live with spouse Illicit drug use. no Alcohol use. current drinker; only occasionally per week Children. 3   Medication  History Lisinopril (40MG  Tablet, Oral) Active. Hydrochlorothiazide (12.5MG  Tablet, Oral) Active.   Past Surgical History Vasectomy Total Hip Replacement. left Arthroscopy of Knee. bilateral Arthroscopy of Shoulder. bilateral Total Knee Replacement. right Rotator Cuff Repair. right  Medical history Sleep Apnea. CPAP High blood pressure   Review of Systems General:Not Present- Chills, Fever, Night Sweats, Fatigue, Weight Gain, Weight Loss and Memory Loss. Skin:Not Present- Hives, Itching, Rash, Eczema and Lesions. HEENT:Not Present- Tinnitus, Headache, Double Vision, Visual Loss, Hearing Loss and Dentures. Respiratory:Not Present- Shortness of breath with exertion, Shortness of breath at rest, Allergies, Coughing up blood and Chronic Cough. Cardiovascular:Not Present- Chest Pain, Racing/skipping heartbeats, Difficulty Breathing Lying Down, Murmur, Swelling and Palpitations. Gastrointestinal:Not Present- Bloody Stool, Heartburn, Abdominal Pain, Vomiting, Nausea, Constipation, Diarrhea, Difficulty Swallowing, Jaundice and Loss of appetitie. Male Genitourinary:Not Present- Urinary frequency, Blood in Urine, Weak urinary stream, Discharge, Flank Pain, Incontinence, Painful Urination, Urgency, Urinary Retention and Urinating at Night. Musculoskeletal:Present- Muscle Pain. Not Present- Muscle Weakness, Joint Swelling, Joint Pain, Back Pain, Morning Stiffness and Spasms. Neurological:Not Present- Tremor, Dizziness, Blackout spells, Paralysis, Difficulty with balance and Weakness. Psychiatric:Not Present- Insomnia.   Physical Exam The physical exam findings are as follows:   General Mental Status - Alert, cooperative and good historian. General Appearance- pleasant. Not in acute distress. Orientation- Oriented X3. Build & Nutrition- Obese and Well developed.   Head and Neck Head- normocephalic, atraumatic . Neck Global Assessment- supple. no bruit  auscultated on the right and no bruit auscultated on the left.   Eye Pupil- Bilateral- Regular and Round. Motion- Bilateral- EOMI.   Chest and Lung Exam Auscultation: Breath sounds:- clear at anterior chest wall and - clear at posterior  chest wall. Adventitious sounds:- No Adventitious sounds.   Cardiovascular Auscultation:Rhythm- Regular rate and rhythm. Heart Sounds- S1 WNL and S2 WNL. Murmurs & Other Heart Sounds:Auscultation of the heart reveals - No Murmurs.   Abdomen Inspection:Contour- Obese. Palpation/Percussion:Tenderness- Abdomen is non-tender to palpation. Rigidity (guarding)- Abdomen is soft. Auscultation:Auscultation of the abdomen reveals - Bowel sounds normal.   Male Genitourinary Not done, not pertinent to present illness  Peripheral Vascular Upper Extremity: Palpation:- Pulses bilaterally normal. Lower Extremity: Palpation:- Pulses bilaterally normal.  Neurologic Examination of related systems reveals - normal muscle strength and tone in all extremities. Neurologic evaluation reveals - normal sensation.  Musculoskeletal  No pain with motion of the left or right hips. No effusion or instability noted in the knees.  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  Avel Peace, PA-C

## 2012-04-09 ENCOUNTER — Inpatient Hospital Stay (HOSPITAL_COMMUNITY): Payer: 59 | Admitting: Anesthesiology

## 2012-04-09 ENCOUNTER — Inpatient Hospital Stay (HOSPITAL_COMMUNITY): Payer: 59

## 2012-04-09 ENCOUNTER — Inpatient Hospital Stay (HOSPITAL_COMMUNITY)
Admission: RE | Admit: 2012-04-09 | Discharge: 2012-04-10 | DRG: 467 | Disposition: A | Discharge status: Home Health | Payer: 59 | Source: Ambulatory Visit | Attending: Orthopedic Surgery | Admitting: Orthopedic Surgery

## 2012-04-09 ENCOUNTER — Encounter (HOSPITAL_COMMUNITY): Payer: Self-pay | Admitting: Anesthesiology

## 2012-04-09 ENCOUNTER — Encounter (HOSPITAL_COMMUNITY): Payer: Self-pay | Admitting: *Deleted

## 2012-04-09 ENCOUNTER — Encounter (HOSPITAL_COMMUNITY)
Admission: RE | Disposition: A | Discharge status: Home Health | Payer: Self-pay | Source: Ambulatory Visit | Attending: Orthopedic Surgery

## 2012-04-09 DIAGNOSIS — Z96649 Presence of unspecified artificial hip joint: Secondary | ICD-10-CM

## 2012-04-09 DIAGNOSIS — T84099A Other mechanical complication of unspecified internal joint prosthesis, initial encounter: Principal | ICD-10-CM | POA: Diagnosis present

## 2012-04-09 DIAGNOSIS — Z6841 Body Mass Index (BMI) 40.0 and over, adult: Secondary | ICD-10-CM

## 2012-04-09 DIAGNOSIS — T84028A Dislocation of other internal joint prosthesis, initial encounter: Secondary | ICD-10-CM

## 2012-04-09 DIAGNOSIS — E669 Obesity, unspecified: Secondary | ICD-10-CM | POA: Diagnosis present

## 2012-04-09 DIAGNOSIS — Y92009 Unspecified place in unspecified non-institutional (private) residence as the place of occurrence of the external cause: Secondary | ICD-10-CM

## 2012-04-09 DIAGNOSIS — Y831 Surgical operation with implant of artificial internal device as the cause of abnormal reaction of the patient, or of later complication, without mention of misadventure at the time of the procedure: Secondary | ICD-10-CM | POA: Diagnosis present

## 2012-04-09 HISTORY — PX: TOTAL HIP REVISION: SHX763

## 2012-04-09 LAB — TYPE AND SCREEN

## 2012-04-09 SURGERY — TOTAL HIP REVISION
Anesthesia: General | Site: Hip | Laterality: Left | Wound class: Clean

## 2012-04-09 MED ORDER — BUPIVACAINE LIPOSOME 1.3 % IJ SUSP
20.0000 mL | Freq: Once | INTRAMUSCULAR | Status: AC
  Administered 2012-04-09: 20 mL
  Filled 2012-04-09: qty 20

## 2012-04-09 MED ORDER — LACTATED RINGERS IV SOLN: INTRAVENOUS | Status: DC

## 2012-04-09 MED ORDER — RIVAROXABAN 10 MG PO TABS: 10.0000 mg | ORAL_TABLET | Freq: Every day | ORAL | Status: DC

## 2012-04-09 MED ORDER — ACETAMINOPHEN 10 MG/ML IV SOLN
INTRAVENOUS | Status: DC | PRN
  Administered 2012-04-09: 1000 mg via INTRAVENOUS

## 2012-04-09 MED ORDER — BISACODYL 10 MG RE SUPP: 10.0000 mg | Freq: Every day | RECTAL | Status: DC | PRN

## 2012-04-09 MED ORDER — METOCLOPRAMIDE HCL 5 MG/ML IJ SOLN
5.0000 mg | Freq: Three times a day (TID) | INTRAMUSCULAR | Status: DC | PRN
  Administered 2012-04-09: 10 mg via INTRAVENOUS
  Filled 2012-04-09: qty 2

## 2012-04-09 MED ORDER — KETAMINE HCL 10 MG/ML IJ SOLN
INTRAMUSCULAR | Status: DC | PRN
  Administered 2012-04-09 (×2): 10 mg via INTRAVENOUS
  Administered 2012-04-09: 30 mg via INTRAVENOUS

## 2012-04-09 MED ORDER — RIVAROXABAN 10 MG PO TABS
10.0000 mg | ORAL_TABLET | Freq: Every day | ORAL | Status: DC
Start: 2012-04-10 — End: 2012-04-10
  Administered 2012-04-10: 10 mg via ORAL
  Filled 2012-04-09 (×2): qty 1

## 2012-04-09 MED ORDER — NEOSTIGMINE METHYLSULFATE 1 MG/ML IJ SOLN
INTRAMUSCULAR | Status: DC | PRN
  Administered 2012-04-09: 5 mg via INTRAVENOUS

## 2012-04-09 MED ORDER — SUCCINYLCHOLINE CHLORIDE 20 MG/ML IJ SOLN
INTRAMUSCULAR | Status: DC | PRN
  Administered 2012-04-09: 100 mg via INTRAVENOUS

## 2012-04-09 MED ORDER — LACTATED RINGERS IV SOLN
INTRAVENOUS | Status: DC | PRN
  Administered 2012-04-09 (×3): via INTRAVENOUS

## 2012-04-09 MED ORDER — TRAMADOL HCL 50 MG PO TABS: 50.0000 mg | ORAL_TABLET | Freq: Four times a day (QID) | ORAL | Status: DC | PRN

## 2012-04-09 MED ORDER — METOCLOPRAMIDE HCL 10 MG PO TABS: 5.0000 mg | ORAL_TABLET | Freq: Three times a day (TID) | ORAL | Status: DC | PRN

## 2012-04-09 MED ORDER — CISATRACURIUM BESYLATE (PF) 10 MG/5ML IV SOLN
INTRAVENOUS | Status: DC | PRN
  Administered 2012-04-09: 8 mg via INTRAVENOUS

## 2012-04-09 MED ORDER — LIDOCAINE HCL (CARDIAC) 20 MG/ML IV SOLN
INTRAVENOUS | Status: DC | PRN
  Administered 2012-04-09: 100 mg via INTRAVENOUS

## 2012-04-09 MED ORDER — MIDAZOLAM HCL 5 MG/5ML IJ SOLN
INTRAMUSCULAR | Status: DC | PRN
  Administered 2012-04-09: 2 mg via INTRAVENOUS

## 2012-04-09 MED ORDER — HYDROMORPHONE HCL PF 1 MG/ML IJ SOLN
0.2500 mg | INTRAMUSCULAR | Status: DC | PRN
  Administered 2012-04-09 (×2): 0.5 mg via INTRAVENOUS

## 2012-04-09 MED ORDER — CEFAZOLIN SODIUM-DEXTROSE 2-3 GM-% IV SOLR
2.0000 g | Freq: Four times a day (QID) | INTRAVENOUS | Status: AC
  Administered 2012-04-09 (×2): 2 g via INTRAVENOUS
  Filled 2012-04-09 (×2): qty 50

## 2012-04-09 MED ORDER — DEXAMETHASONE SODIUM PHOSPHATE 10 MG/ML IJ SOLN
INTRAMUSCULAR | Status: DC | PRN
  Administered 2012-04-09: 10 mg via INTRAVENOUS

## 2012-04-09 MED ORDER — HYDROCODONE-ACETAMINOPHEN 7.5-325 MG PO TABS: 1.0000 | ORAL_TABLET | ORAL | Status: DC | PRN

## 2012-04-09 MED ORDER — PHENOL 1.4 % MT LIQD
1.0000 | OROMUCOSAL | Status: DC | PRN
  Filled 2012-04-09 (×4): qty 177

## 2012-04-09 MED ORDER — HYDROCODONE-ACETAMINOPHEN 7.5-325 MG PO TABS
1.0000 | ORAL_TABLET | ORAL | Status: DC | PRN
Start: 2012-04-09 — End: 2012-04-10
  Administered 2012-04-09 – 2012-04-10 (×6): 2 via ORAL
  Filled 2012-04-09 (×6): qty 2

## 2012-04-09 MED ORDER — PROPOFOL 10 MG/ML IV BOLUS
INTRAVENOUS | Status: DC | PRN
  Administered 2012-04-09: 250 mg via INTRAVENOUS

## 2012-04-09 MED ORDER — HYDROCHLOROTHIAZIDE 12.5 MG PO CAPS
12.5000 mg | ORAL_CAPSULE | ORAL | Status: DC
  Administered 2012-04-10: 12.5 mg via ORAL
  Filled 2012-04-09 (×3): qty 1

## 2012-04-09 MED ORDER — KCL IN DEXTROSE-NACL 20-5-0.9 MEQ/L-%-% IV SOLN
INTRAVENOUS | Status: DC
  Administered 2012-04-09 (×2): via INTRAVENOUS
  Filled 2012-04-09 (×4): qty 1000

## 2012-04-09 MED ORDER — SODIUM CHLORIDE 0.9 % IJ SOLN
INTRAMUSCULAR | Status: DC | PRN
  Administered 2012-04-09: 50 mL via INTRAVENOUS

## 2012-04-09 MED ORDER — 0.9 % SODIUM CHLORIDE (POUR BTL) OPTIME
TOPICAL | Status: DC | PRN
  Administered 2012-04-09: 1000 mL

## 2012-04-09 MED ORDER — SUFENTANIL CITRATE 50 MCG/ML IV SOLN
INTRAVENOUS | Status: DC | PRN
  Administered 2012-04-09 (×2): 10 ug via INTRAVENOUS
  Administered 2012-04-09: 20 ug via INTRAVENOUS
  Administered 2012-04-09: 10 ug via INTRAVENOUS

## 2012-04-09 MED ORDER — MORPHINE SULFATE 2 MG/ML IJ SOLN: 1.0000 mg | INTRAMUSCULAR | Status: DC | PRN

## 2012-04-09 MED ORDER — ONDANSETRON HCL 4 MG/2ML IJ SOLN
4.0000 mg | Freq: Four times a day (QID) | INTRAMUSCULAR | Status: DC | PRN
  Administered 2012-04-09: 4 mg via INTRAVENOUS
  Filled 2012-04-09: qty 2

## 2012-04-09 MED ORDER — FLEET ENEMA 7-19 GM/118ML RE ENEM: 1.0000 | ENEMA | Freq: Once | RECTAL | Status: AC | PRN

## 2012-04-09 MED ORDER — DEXTROSE 5 % IV SOLN
3.0000 g | INTRAVENOUS | Status: AC
  Administered 2012-04-09: 3 g via INTRAVENOUS

## 2012-04-09 MED ORDER — DIPHENHYDRAMINE HCL 12.5 MG/5ML PO ELIX: 12.5000 mg | ORAL_SOLUTION | ORAL | Status: DC | PRN

## 2012-04-09 MED ORDER — ONDANSETRON HCL 4 MG PO TABS: 4.0000 mg | ORAL_TABLET | Freq: Four times a day (QID) | ORAL | Status: DC | PRN

## 2012-04-09 MED ORDER — METHOCARBAMOL 100 MG/ML IJ SOLN
500.0000 mg | Freq: Four times a day (QID) | INTRAVENOUS | Status: DC | PRN
  Administered 2012-04-09: 500 mg via INTRAVENOUS
  Filled 2012-04-09: qty 5

## 2012-04-09 MED ORDER — ACETAMINOPHEN 10 MG/ML IV SOLN
1000.0000 mg | Freq: Four times a day (QID) | INTRAVENOUS | Status: AC
  Administered 2012-04-09 – 2012-04-10 (×4): 1000 mg via INTRAVENOUS
  Filled 2012-04-09 (×5): qty 100

## 2012-04-09 MED ORDER — MENTHOL 3 MG MT LOZG
1.0000 | LOZENGE | OROMUCOSAL | Status: DC | PRN
  Filled 2012-04-09 (×4): qty 9

## 2012-04-09 MED ORDER — ACETAMINOPHEN 10 MG/ML IV SOLN: 1000.0000 mg | Freq: Once | INTRAVENOUS | Status: DC

## 2012-04-09 MED ORDER — GLYCOPYRROLATE 0.2 MG/ML IJ SOLN
INTRAMUSCULAR | Status: DC | PRN
  Administered 2012-04-09: .8 mg via INTRAVENOUS

## 2012-04-09 MED ORDER — POLYETHYLENE GLYCOL 3350 17 G PO PACK: 17.0000 g | PACK | Freq: Every day | ORAL | Status: DC | PRN

## 2012-04-09 MED ORDER — DOCUSATE SODIUM 100 MG PO CAPS
100.0000 mg | ORAL_CAPSULE | Freq: Two times a day (BID) | ORAL | Status: DC
  Administered 2012-04-09 – 2012-04-10 (×3): 100 mg via ORAL

## 2012-04-09 MED ORDER — METHOCARBAMOL 500 MG PO TABS: 500.0000 mg | ORAL_TABLET | Freq: Four times a day (QID) | ORAL | Status: DC | PRN

## 2012-04-09 MED ORDER — ONDANSETRON HCL 4 MG/2ML IJ SOLN
INTRAMUSCULAR | Status: DC | PRN
  Administered 2012-04-09: 4 mg via INTRAVENOUS

## 2012-04-09 MED ORDER — METHOCARBAMOL 500 MG PO TABS
500.0000 mg | ORAL_TABLET | Freq: Four times a day (QID) | ORAL | Status: DC | PRN
  Administered 2012-04-09 – 2012-04-10 (×3): 500 mg via ORAL
  Filled 2012-04-09 (×3): qty 1

## 2012-04-09 MED ORDER — SODIUM CHLORIDE 0.9 % IV SOLN: INTRAVENOUS | Status: DC

## 2012-04-09 SURGICAL SUPPLY — 62 items
BAG ZIPLOCK 12X15 (MISCELLANEOUS) ×6 IMPLANT
BIT DRILL 2.8X128 (BIT) ×2 IMPLANT
BLADE EXTENDED COATED 6.5IN (ELECTRODE) ×2 IMPLANT
BLADE SAW SAG 73X25 THK (BLADE) ×1
BLADE SAW SGTL 73X25 THK (BLADE) ×1 IMPLANT
CATH KIT ON-Q SILVERSOAK 5IN (CATHETERS) ×2 IMPLANT
CLOTH BEACON ORANGE TIMEOUT ST (SAFETY) ×2 IMPLANT
CLSR STERI-STRIP ANTIMIC 1/2X4 (GAUZE/BANDAGES/DRESSINGS) ×2 IMPLANT
CONT SPECI 4OZ STER CLIK (MISCELLANEOUS) IMPLANT
DRAPE INCISE IOBAN 66X45 STRL (DRAPES) ×2 IMPLANT
DRAPE ORTHO SPLIT 77X108 STRL (DRAPES) ×2
DRAPE POUCH INSTRU U-SHP 10X18 (DRAPES) ×2 IMPLANT
DRAPE SURG ORHT 6 SPLT 77X108 (DRAPES) ×2 IMPLANT
DRAPE U-SHAPE 47X51 STRL (DRAPES) ×2 IMPLANT
DRSG ADAPTIC 3X8 NADH LF (GAUZE/BANDAGES/DRESSINGS) ×2 IMPLANT
DRSG EMULSION OIL 3X16 NADH (GAUZE/BANDAGES/DRESSINGS) ×2 IMPLANT
DRSG MEPILEX BORDER 4X4 (GAUZE/BANDAGES/DRESSINGS) ×2 IMPLANT
DRSG MEPILEX BORDER 4X8 (GAUZE/BANDAGES/DRESSINGS) ×2 IMPLANT
DURAPREP 26ML APPLICATOR (WOUND CARE) ×2 IMPLANT
ELECT REM PT RETURN 9FT ADLT (ELECTROSURGICAL) ×2
ELECTRODE REM PT RTRN 9FT ADLT (ELECTROSURGICAL) ×1 IMPLANT
EVACUATOR 1/8 PVC DRAIN (DRAIN) ×2 IMPLANT
FACESHIELD LNG OPTICON STERILE (SAFETY) ×8 IMPLANT
GLOVE BIO SURGEON STRL SZ8 (GLOVE) ×2 IMPLANT
GLOVE BIOGEL PI IND STRL 8 (GLOVE) ×2 IMPLANT
GLOVE BIOGEL PI INDICATOR 8 (GLOVE) ×2
GLOVE ECLIPSE 8.0 STRL XLNG CF (GLOVE) ×2 IMPLANT
GLOVE SURG SS PI 6.5 STRL IVOR (GLOVE) ×4 IMPLANT
GOWN STRL NON-REIN LRG LVL3 (GOWN DISPOSABLE) ×4 IMPLANT
GOWN STRL REIN XL XLG (GOWN DISPOSABLE) ×2 IMPLANT
HEAD BIO DELA CER SROM 32PLUS3 ×1 IMPLANT
IMMOBILIZER KNEE 20 (SOFTGOODS)
IMMOBILIZER KNEE 20 THIGH 36 (SOFTGOODS) IMPLANT
KIT BASIN OR (CUSTOM PROCEDURE TRAY) ×2 IMPLANT
LINER PINN CONS 32X54 (Hips) ×1 IMPLANT
LINER PINNACLE 32X54 (Hips) ×1 IMPLANT
MANIFOLD NEPTUNE II (INSTRUMENTS) ×2 IMPLANT
NDL SAFETY ECLIPSE 18X1.5 (NEEDLE) IMPLANT
NEEDLE HYPO 18GX1.5 SHARP (NEEDLE)
NS IRRIG 1000ML POUR BTL (IV SOLUTION) ×2 IMPLANT
PACK TOTAL JOINT (CUSTOM PROCEDURE TRAY) ×2 IMPLANT
PASSER SUT SWANSON 36MM LOOP (INSTRUMENTS) ×2 IMPLANT
POSITIONER SURGICAL ARM (MISCELLANEOUS) ×2 IMPLANT
SPONGE GAUZE 4X4 12PLY (GAUZE/BANDAGES/DRESSINGS) ×2 IMPLANT
SPONGE LAP 18X18 X RAY DECT (DISPOSABLE) ×2 IMPLANT
SROM BIO DELA CER HEAD 32PLUS3 ×2 IMPLANT
SROM FEM STEM STD 20X15 36+12 (Hips) ×2 IMPLANT
STAPLER VISISTAT 35W (STAPLE) ×2 IMPLANT
STEM FEM SROM STD 20X15 36+12 (Hips) ×1 IMPLANT
SUCTION FRAZIER TIP 10 FR DISP (SUCTIONS) ×2 IMPLANT
SUT ETHIBOND NAB CT1 #1 30IN (SUTURE) ×4 IMPLANT
SUT VIC AB 1 CT1 27 (SUTURE) ×3
SUT VIC AB 1 CT1 27XBRD ANTBC (SUTURE) ×3 IMPLANT
SUT VIC AB 2-0 CT1 27 (SUTURE) ×3
SUT VIC AB 2-0 CT1 TAPERPNT 27 (SUTURE) ×3 IMPLANT
SUT VLOC 180 0 24IN GS25 (SUTURE) ×4 IMPLANT
SWAB COLLECTION DEVICE MRSA (MISCELLANEOUS) ×2 IMPLANT
SYR 50ML LL SCALE MARK (SYRINGE) IMPLANT
TOWEL OR 17X26 10 PK STRL BLUE (TOWEL DISPOSABLE) ×4 IMPLANT
TRAY FOLEY CATH 14FRSI W/METER (CATHETERS) ×2 IMPLANT
TUBE ANAEROBIC SPECIMEN COL (MISCELLANEOUS) IMPLANT
WATER STERILE IRR 1500ML POUR (IV SOLUTION) ×2 IMPLANT

## 2012-04-09 NOTE — Preoperative (Signed)
Beta Blockers   Reason not to administer Beta Blockers:Not Applicable 

## 2012-04-09 NOTE — Anesthesia Postprocedure Evaluation (Signed)
  Anesthesia Post-op Note  Patient: [name]  Procedure(s) Performed: Procedure(s) (LRB): TOTAL HIP REVISION (Left)  Patient Location: PACU  Anesthesia Type: General  Level of Consciousness: awake and alert   Airway and Oxygen Therapy: Patient Spontanous Breathing  Post-op Pain: mild  Post-op Assessment: Post-op Vital signs reviewed, Patient's Cardiovascular Status Stable, Respiratory Function Stable, Patent Airway and No signs of Nausea or vomiting  Post-op Vital Signs: stable  Complications: No apparent anesthesia complications

## 2012-04-09 NOTE — Care Management Note (Unsigned)
    Page 1 of 2   04/09/2012     6:11:11 PM   CARE MANAGEMENT NOTE 04/09/2012  Patient:  [name], [name]   Account Number:  [id number]  Date Initiated:  04/09/2012  Documentation initiated by:  Colleen Can  Subjective/Objective Assessment:   DX HIP REPLACEMNT REVISION-LEFT     Action/Plan:   CM SPOKE WITH PATIENT AND SPOUSE.  plans are for patient to return to his home in [place] where spouse will be caregiver. Already has DME. Wants network HH agency for PT services   Anticipated DC Date:  04/12/2012   Anticipated DC Plan:  HOME W HOME HEALTH SERVICES  In-house referral  NA      DC Planning Services  CM consult      [facility]  HOME HEALTH   Choice offered to / List presented to:  C-1 Patient   DME arranged  NA      DME agency  NA     HH arranged  HH-2 PT      HH agency  [facility]   Status of service:  In process, will continue to follow Medicare Important Message given?   (If response is "NO", the following Medicare IM given date fields will be blank) Date Medicare IM given:   Date Additional Medicare IM given:    Discharge Disposition:    Per UR Regulation:    If discussed at Long Length of Stay Meetings, dates discussed:    Comments:  04/09/2012 Raynelle Bring BSN CCM [phone] [facility] will start services Monday 09/30 if pt is discharged this weekend. CM will folllow.

## 2012-04-09 NOTE — Anesthesia Procedure Notes (Signed)
Procedure Name: Intubation Date/Time: 04/09/2012 7:33 AM Performed by: Leroy Libman L Patient Re-evaluated:Patient Re-evaluated prior to inductionOxygen Delivery Method: Circle system utilized Preoxygenation: Pre-oxygenation with 100% oxygen Intubation Type: IV induction Ventilation: Mask ventilation without difficulty and Oral airway inserted - appropriate to patient size Laryngoscope Size: Miller and 3 Grade View: Grade III Tube type: Oral Tube size: 8.0 mm Number of attempts: 1 Airway Equipment and Method: Bougie stylet Placement Confirmation: ETT inserted through vocal cords under direct vision,  breath sounds checked- equal and bilateral and positive ETCO2 Secured at: 22 cm Tube secured with: Tape Dental Injury: Teeth and Oropharynx as per pre-operative assessment  Difficulty Due To: Difficult Airway- due to anterior larynx, Difficulty was anticipated and Difficult Airway- due to reduced neck mobility Future Recommendations: Recommend- induction with short-acting agent, and alternative techniques readily available

## 2012-04-09 NOTE — Op Note (Signed)
NAMEDARIC, PASLAY NO.:  [id number]  MEDICAL RECORD NO.:  [id number]  LOCATION:  1616                         FACILITY:  [facility]  PHYSICIAN:  Ollen Gross, M.D.    DATE OF BIRTH:  [birth year]  DATE OF PROCEDURE:  04/09/2012 DATE OF DISCHARGE:                              OPERATIVE REPORT   PREOPERATIVE DIAGNOSIS:  Instability, left total hip arthroplasty.  POSTOPERATIVE DIAGNOSIS:  Instability, left total hip arthroplasty.  PROCEDURE:  Left total hip revision.  SURGEON:  Ollen Gross, MD  ASSISTANT:  Jaquelyn Bitter. Chabon, PA-C  ANESTHESIA:  General.  ESTIMATED BLOOD LOSS:  350.  DRAIN:  Hemovac x1.  COMPLICATIONS:  None.  CONDITION:  Stable to recovery.  BRIEF CLINICAL NOTE:  Mr. Pytel is a 61 year old male, who had a left total hip arthroplasty with metal on metal and ended up developing a reaction to the metal.  Was revised to ceramic on polyethylene.  He ended up developing instability.  He presents now for revision to either constrained liner, revision of total hip arthroplasty versus revision of selective components in order to make him more stable.  PROCEDURE IN DETAIL:  After successful administration of general anesthetic, the patient was placed in the right lateral decubitus position with the left side up and held with the hip positioner.  His left lower extremity was isolated from his perineum with plastic drapes and prepped and draped in usual sterile fashion.  Short posterolateral incisions were utilized.  Skin cut with a 10 blade through the subcutaneous tissue to the level of the fascia lata which was incised in line with the skin incision.  I did not encounter any fluid.  Sciatic nerve was palpated and protected and the posterior pseudocapsule removed off the femur.  Hip was then dislocated.  The ceramic head was removed. He had a 36+ 8 neck on the stem and I decided to increase offset and improve stability.  We would remove the  stem and change it to a 36+ 12. We subsequently placed the S-ROM osteotome in the interface between the femoral stem and femoral sleeve to disrupt the [facility] taper.  I was then able to easily extract the stem from the sleeve.  The femur was then retracted anteriorly to gain acetabular exposure. Acetabulum is found to be in excellent anatomic position as we did not need to revise the acetabulum.  Given the soft tissue laxity, I decided to place a constrained liner.  I removed the acetabular liner from the shell.  There is a well-fixed 54 mm pinnacle acetabular shell.  We then placed the marathon constrained liner 32+ 4 neutral.  I impacted into the acetabulum with excellent fit.  I then reamed through the femoral sleeve into the femoral canal up to 15.5 mm for placement of a 20 x 15 stem with a 36+ 12 neck.  We placed this matching native anteversion.  The stem is impacted.  We placed a 32+ 3 ceramic head.  I reduced the ball into the constrained liner.  We then placed a locking ring around the liner.  His range of motion was full extension and full external rotation, 70 degrees flexion, 40 degrees adduction,  and about 40 degrees internal rotation, 90 degrees of flexion and about 60 degrees of internal rotation, and I was able to flex up to about 115 without difficulty.  There was absolutely no impingement through any of this range of motion.  Wound was then copiously irrigated with saline solution and posterior soft tissue structures reattached to the femur through drill holes.  Fascia lata was closed over Hemovac drain with a running #1 V-Loc suture.  The Exparel was injected into the posterior pseudocapsule, the gluteal muscles, fascia lata, and subcu tissues.  Subcu was closed with interrupted 2-0 Vicryl, subcuticular running 4-0 Monocryl.  The drain was hooked to suction.  Incision cleaned and dried.  Steri-Strips and a bulky sterile dressing applied.  Please note that a surgical  assistant was a medical necessity for this procedure to perform it in a safe and expeditious manner.  Surgical assistant was necessary to retract vital neurovascular structures, position the femur in appropriate fashion for removal and placement of the new prosthesis and for retraction and for exposure of the acetabulum to perform this in a safe manner.  The patient was awakened and transported to recovery in stable condition.     Ollen Gross, M.D.     FA/MEDQ  D:  04/09/2012  T:  04/09/2012  Job:  [id number]

## 2012-04-09 NOTE — H&P (View-Only) (Signed)
[name]  DOB: 01/29/1951 Married / Language: English / Race: White Male  Date of Admission:  04/09/2012  Chief Complaint:  Left Hip Instability  History of Present Illness The patient is a 61 year old male who comes in today for a preoperative History and Physical. The patient is scheduled for a left acetabular revision vs constrained liner to be performed by Dr. Frank V. Aluisio, MD at [facility] on Friday April 09, 2012 . He has had four dislocations since his latest surgery. One went in on its own. Another was put back in at [place]. The third was when he was on a cruise and they put it back in. The most recent was while on was on a business trip in [place]. He is dislocating during simple activities like sitting down. His primary total hip failed due to complications with metal on metal. This second total hip is failing as a result of instability causing repeated dislocations. He does not have pain except for musle soreness that occurs for a few days after a dislocation. Due to failure of the left total hip, he needs an acetabular revision vs placement of a constrained liner.   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 difficile colitis (008.45). Past History   Allergies Codeine/Codeine Derivatives. Rash.   Family History Heart Disease. mother and father Rheumatoid Arthritis. mother   Social History Tobacco use. never smoker Tobacco / smoke exposure. no Drug/Alcohol Rehab (Previously). no Pain Contract. no Number of flights of stairs before winded. 2-3 Marital status. married Drug/Alcohol Rehab (Currently). no Exercise. Exercises weekly Current work status. working full time Living situation. live with spouse Illicit drug use. no Alcohol use. current drinker; only occasionally per week Children. 3   Medication  History Lisinopril (40MG Tablet, Oral) Active. Hydrochlorothiazide (12.5MG Tablet, Oral) Active.   Past Surgical History Vasectomy Total Hip Replacement. left Arthroscopy of Knee. bilateral Arthroscopy of Shoulder. bilateral Total Knee Replacement. right Rotator Cuff Repair. right  Medical history Sleep Apnea. CPAP High blood pressure   Review of Systems General:Not Present- Chills, Fever, Night Sweats, Fatigue, Weight Gain, Weight Loss and Memory Loss. Skin:Not Present- Hives, Itching, Rash, Eczema and Lesions. HEENT:Not Present- Tinnitus, Headache, Double Vision, Visual Loss, Hearing Loss and Dentures. Respiratory:Not Present- Shortness of breath with exertion, Shortness of breath at rest, Allergies, Coughing up blood and Chronic Cough. Cardiovascular:Not Present- Chest Pain, Racing/skipping heartbeats, Difficulty Breathing Lying Down, Murmur, Swelling and Palpitations. Gastrointestinal:Not Present- Bloody Stool, Heartburn, Abdominal Pain, Vomiting, Nausea, Constipation, Diarrhea, Difficulty Swallowing, Jaundice and Loss of appetitie. Male Genitourinary:Not Present- Urinary frequency, Blood in Urine, Weak urinary stream, Discharge, Flank Pain, Incontinence, Painful Urination, Urgency, Urinary Retention and Urinating at Night. Musculoskeletal:Present- Muscle Pain. Not Present- Muscle Weakness, Joint Swelling, Joint Pain, Back Pain, Morning Stiffness and Spasms. Neurological:Not Present- Tremor, Dizziness, Blackout spells, Paralysis, Difficulty with balance and Weakness. Psychiatric:Not Present- Insomnia.   Physical Exam The physical exam findings are as follows:   General Mental Status - Alert, cooperative and good historian. General Appearance- pleasant. Not in acute distress. Orientation- Oriented X3. Build & Nutrition- Obese and Well developed.   Head and Neck Head- normocephalic, atraumatic . Neck Global Assessment- supple. no bruit  auscultated on the right and no bruit auscultated on the left.   Eye Pupil- Bilateral- Regular and Round. Motion- Bilateral- EOMI.   Chest and Lung Exam Auscultation: Breath sounds:- clear at anterior chest wall and - clear at posterior   chest wall. Adventitious sounds:- No Adventitious sounds.   Cardiovascular Auscultation:Rhythm- Regular rate and rhythm. Heart Sounds- S1 WNL and S2 WNL. Murmurs & Other Heart Sounds:Auscultation of the heart reveals - No Murmurs.   Abdomen Inspection:Contour- Obese. Palpation/Percussion:Tenderness- Abdomen is non-tender to palpation. Rigidity (guarding)- Abdomen is soft. Auscultation:Auscultation of the abdomen reveals - Bowel sounds normal.   Male Genitourinary Not done, not pertinent to present illness  Peripheral Vascular Upper Extremity: Palpation:- Pulses bilaterally normal. Lower Extremity: Palpation:- Pulses bilaterally normal.  Neurologic Examination of related systems reveals - normal muscle strength and tone in all extremities. Neurologic evaluation reveals - normal sensation.  Musculoskeletal  No pain with motion of the left or right hips. No effusion or instability noted in the knees.  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  Drew Constancia Geeting, PA-C  

## 2012-04-09 NOTE — Progress Notes (Signed)
Utilization review completed.  

## 2012-04-09 NOTE — Interval H&P Note (Signed)
History and Physical Interval Note:  04/09/2012 7:19 AM  [name]  has presented today for surgery, with the diagnosis of unstable left total hip arthroplasty  The various methods of treatment have been discussed with the patient and family. After consideration of risks, benefits and other options for treatment, the patient has consented to  Procedure(s) (LRB) with comments: TOTAL HIP REVISION (Left) - Left Hip Acetabular Revision vs Constrained Liner as a surgical intervention .  The patient's history has been reviewed, patient examined, no change in status, stable for surgery.  I have reviewed the patient's chart and labs.  Questions were answered to the patient's satisfaction.     Loanne Drilling

## 2012-04-09 NOTE — Transfer of Care (Signed)
Immediate Anesthesia Transfer of Care Note  Patient: [name]  Procedure(s) Performed: Procedure(s) (LRB) with comments: TOTAL HIP REVISION (Left) - Left Hip Acetabular Revision vs Constrained Liner  Patient Location: PACU  Anesthesia Type: General  Level of Consciousness: awake, alert  and oriented  Airway & Oxygen Therapy: Patient Spontanous Breathing and Patient connected to face mask oxygen  Post-op Assessment: Report given to PACU RN and Post -op Vital signs reviewed and stable  Post vital signs: Reviewed and stable  Complications: No apparent anesthesia complications

## 2012-04-09 NOTE — Brief Op Note (Signed)
04/09/2012  8:55 AM  PATIENT:  Margret Chance  61 y.o. male  PRE-OPERATIVE DIAGNOSIS:  unstable left total hip arthroplasty  POST-OPERATIVE DIAGNOSIS:  unstable left total hip arthroplasty  PROCEDURE:  Left THA revision SURGEON:  Surgeon(s) and Role:    Loanne Drilling, MD - Primary  PHYSICIAN ASSISTANT:   ASSISTANTS: Leilani Able, PA-C   ANESTHESIA:   general  EBL:  Total I/O In: 1000 [I.V.:1000] Out: 500 [Urine:150; Blood:350]  BLOOD ADMINISTERED:none  DRAINS: (Medium) Hemovact drain(s) in the Left hip with  Suction Open   LOCAL MEDICATIONS USED:  OTHER Exparel 20 ml mixed with 50 ml normal saline  COUNTS:  YES  TOURNIQUET:  * No tourniquets in log *  DICTATION: .Other Dictation: Dictation Number [id number]  PLAN OF CARE: Admit to inpatient   PATIENT DISPOSITION:  PACU - hemodynamically stable.

## 2012-04-09 NOTE — Anesthesia Preprocedure Evaluation (Addendum)
Anesthesia Evaluation  Patient identified by MRN, date of birth, ID band Patient awake    Reviewed: Allergy & Precautions, H&P , NPO status , Patient's Chart, lab work & pertinent test results  History of Anesthesia Complications (+) PONV  Airway Mallampati: III TM Distance: >3 FB Neck ROM: full    Dental No notable dental hx. (+) Teeth Intact and Dental Advisory Given   Pulmonary sleep apnea and Continuous Positive Airway Pressure Ventilation ,  breath sounds clear to auscultation  Pulmonary exam normal       Cardiovascular hypertension, On Medications Rhythm:regular Rate:Normal     Neuro/Psych negative neurological ROS  negative psych ROS   GI/Hepatic negative GI ROS, Neg liver ROS,   Endo/Other  negative endocrine ROSMorbid obesity  Renal/GU negative Renal ROS  negative genitourinary   Musculoskeletal   Abdominal   Peds  Hematology negative hematology ROS (+)   Anesthesia Other Findings   Reproductive/Obstetrics negative OB ROS                          Anesthesia Physical Anesthesia Plan  ASA: III  Anesthesia Plan: General   Post-op Pain Management:    Induction: Intravenous  Airway Management Planned: Oral ETT  Additional Equipment:   Intra-op Plan:   Post-operative Plan: Extubation in OR  Informed Consent: I have reviewed the patients History and Physical, chart, labs and discussed the procedure including the risks, benefits and alternatives for the proposed anesthesia with the patient or authorized representative who has indicated his/her understanding and acceptance.   Dental Advisory Given  Plan Discussed with: Surgeon  Anesthesia Plan Comments:         Anesthesia Quick Evaluation

## 2012-04-10 LAB — CBC
HCT: 36.6 % — ABNORMAL LOW (ref 39.0–52.0)
Hemoglobin: 12.7 g/dL — ABNORMAL LOW (ref 13.0–17.0)
MCV: 82.1 fL (ref 78.0–100.0)
RBC: 4.46 MIL/uL (ref 4.22–5.81)
WBC: 14.1 10*3/uL — ABNORMAL HIGH (ref 4.0–10.5)

## 2012-04-10 LAB — BASIC METABOLIC PANEL
BUN: 16 mg/dL (ref 6–23)
Calcium: 8.6 mg/dL (ref 8.4–10.5)
Creatinine, Ser: 0.84 mg/dL (ref 0.50–1.35)
GFR calc Af Amer: >90 mL/min (ref >90)
GFR calc non Af Amer: >90 mL/min (ref >90)
Potassium: 4.2 mEq/L (ref 3.5–5.1)

## 2012-04-10 NOTE — Progress Notes (Signed)
Subjective: 1 Day Post-Op Procedure(s) (LRB): TOTAL HIP REVISION (Left) Patient reports pain as 1 on 0-10 scale.    Objective: Vital signs in last 24 hours: Temp:  [96.7 F (35.9 C)-98.5 F (36.9 C)] 98 F (36.7 C) (09/28 0622) Pulse Rate:  [50-81] 50  (09/28 0622) Resp:  [12-19] 13  (09/28 0800) BP: (107-148)/(53-84) 148/62 mmHg (09/28 0622) SpO2:  [92 %-100 %] 98 % (09/28 0800) Weight:  [128 kg (282 lb 3 oz)] 128 kg (282 lb 3 oz) (09/27 1030)  Intake/Output from previous day: 09/27 0701 - 09/28 0700 In: 4190 [P.O.:840; I.V.:3350] Out: 4015 [Urine:3175; Drains:490; Blood:350] Intake/Output this shift: Total I/O In: 480 [P.O.:480] Out: -    Basename 04/10/12 0519  HGB 12.7*    Basename 04/10/12 0519  WBC 14.1*  RBC 4.46  HCT 36.6*  PLT 203    Basename 04/10/12 0519  NA 133*  K 4.2  CL 100  CO2 28  BUN 16  CREATININE 0.84  GLUCOSE 127*  CALCIUM 8.6   No results found for this basename: LABPT:2,INR:2 in the last 72 hours  Neurovascular intact  Assessment/Plan: 1 Day Post-Op Procedure(s) (LRB): TOTAL HIP REVISION (Left) Discharge home with home health  [name] A 04/10/2012, 9:44 AM

## 2012-04-10 NOTE — Evaluation (Signed)
Physical Therapy Evaluation Patient Details Name: [name] MRN: [id number] DOB: 06-19-1951 Today's Date: 04/10/2012 Time: 9528-4132 PT Time Calculation (min): 18 min  PT Assessment / Plan / Recommendation Clinical Impression  Pt presents s/p L total hip revision POD 1 with slightly decreased mobilty/strength.  Pt tolerated OOB and ambulation in hallway very well at Mod I level.  Pt does not require any further PT in acute venue and all further needs can be met with HHPT/OP. PT recommends HHPT followed by outpatient therapy.     PT Assessment  All further PT needs can be met in the next venue of care    Follow Up Recommendations  Home health PT;Outpatient PT    Barriers to Discharge None      Equipment Recommendations  None recommended by PT    Recommendations for Other Services     Frequency      Precautions / Restrictions Precautions Precautions: Posterior Hip Restrictions Weight Bearing Restrictions: No   Pertinent Vitals/Pain Very minimal pain      Mobility  Bed Mobility Bed Mobility: Supine to Sit Supine to Sit: 5: Supervision;With rails;HOB elevated Sitting - Scoot to Edge of Bed: 6: Modified independent (Device/Increase time) Details for Bed Mobility Assistance: cues for hand technique to simulate home environment. Transfers Transfers: Sit to Stand;Stand to Sit Sit to Stand: 6: Modified independent (Device/Increase time) Stand to Sit: 6: Modified independent (Device/Increase time) Details for Transfer Assistance: increased time Ambulation/Gait Ambulation/Gait Assistance: 6: Modified independent (Device/Increase time) Ambulation Distance (Feet): 200 Feet Assistive device: Rolling walker Gait Pattern: Step-through pattern Gait velocity: WFL Stairs: No Wheelchair Mobility Wheelchair Mobility: No    Shoulder Instructions     Exercises Total Joint Exercises Ankle Circles/Pumps: Strengthening;Both;10 reps;Standing Hip ABduction/ADduction:  Strengthening;Left;5 reps;Standing Marching in Standing: Strengthening;Left;10 reps;Standing Standing Hip Extension: Strengthening;Left;5 reps;Standing   PT Diagnosis: Acute pain;Generalized weakness  PT Problem List: Decreased strength;Decreased mobility PT Treatment Interventions:     PT Goals    Visit Information  Assistance Needed: +1 PT/OT Co-Evaluation/Treatment: Yes    Subjective Data  Subjective: I'm ready to be done with all of this.  Patient Stated Goal: to get home   Prior Functioning  Home Living Lives With: Spouse Available Help at Discharge: Family Type of Home: House Home Access: Stairs to enter Secretary/administrator of Steps: 1 Home Layout: One level Bathroom Shower/Tub: Health visitor: Handicapped height Home Adaptive Equipment: Bedside commode/3-in-1;Walker - rolling;Reacher;Long-handled shoehorn Prior Function Level of Independence: Independent with assistive device(s) Able to Take Stairs?: Yes Driving: Yes Vocation: Full time employment Communication Communication: No difficulties Dominant Hand: Right    Cognition  Overall Cognitive Status: Appears within functional limits for tasks assessed/performed Arousal/Alertness: Awake/alert Orientation Level: Appears intact for tasks assessed Behavior During Session: [facility] for tasks performed    Extremity/Trunk Assessment Right Upper Extremity Assessment RUE ROM/Strength/Tone: [facility] for tasks assessed Left Upper Extremity Assessment LUE ROM/Strength/Tone: WFL for tasks assessed Right Lower Extremity Assessment RLE ROM/Strength/Tone: WFL for tasks assessed RLE Sensation: WFL - Light Touch RLE Coordination: WFL - gross/fine motor Left Lower Extremity Assessment LLE ROM/Strength/Tone: WFL for tasks assessed LLE Sensation: WFL - Light Touch LLE Coordination: WFL - gross/fine motor Trunk Assessment Trunk Assessment: Normal   Balance    End of Session PT - End of Session Activity  Tolerance: Patient tolerated treatment well Patient left: in chair;with call bell/phone within reach;with family/visitor present Nurse Communication: Mobility status  GP     Page, [name] 04/10/2012, 10:57 AM

## 2012-04-10 NOTE — Evaluation (Signed)
Occupational Therapy Evaluation Patient Details Name: [name] MRN: [id number] DOB: 08/04/1950 Today's Date: 04/10/2012 Time: 4098-1191 OT Time Calculation (min): 15 min  OT Assessment / Plan / Recommendation Clinical Impression  Pt doing very well POD 1 L hip revision. All education completed. Pt will have necessary level of A from wife upon d/c.    OT Assessment  Patient does not need any further OT services    Follow Up Recommendations  No OT follow up    Barriers to Discharge      Equipment Recommendations  None recommended by OT    Recommendations for Other Services    Frequency       Precautions / Restrictions Precautions Precautions: Posterior Hip Restrictions Weight Bearing Restrictions: No   Pertinent Vitals/Pain Reported 4/10 pain. Repositioned for comfort.    ADL  Grooming: Performed;Teeth care;Modified independent Toilet Transfer: Simulated;Modified independent Toilet Transfer Method: Sit to Barista: Other (comment) (recliner) Toileting - Clothing Manipulation and Hygiene: Simulated;Modified independent Where Assessed - Toileting Clothing Manipulation and Hygiene: Standing Tub/Shower Transfer: Engineer, manufacturing Method: Science writer: Walk in Scientist, research (physical sciences) Used: Rolling walker ADL Comments: Educated pt how to safely step into walk in shower.    OT Diagnosis:    OT Problem List:   OT Treatment Interventions:     OT Goals    Visit Information  Last OT Received On: 04/10/12 Assistance Needed: +1    Subjective Data  Subjective: If I do well I'm going home Patient Stated Goal: Go home today.   Prior Functioning     Home Living Lives With: Spouse Available Help at Discharge: Family Type of Home: House Home Access: Stairs to enter Secretary/administrator of Steps: 1 Home Layout: One level Bathroom Shower/Tub: Health visitor: Handicapped  height Home Adaptive Equipment: Bedside commode/3-in-1;Walker - rolling;Reacher;Long-handled shoehorn Prior Function Level of Independence: Independent with assistive device(s) Able to Take Stairs?: Yes Driving: Yes Vocation: Full time employment Communication Communication: No difficulties Dominant Hand: Right         Vision/Perception     Cognition  Overall Cognitive Status: Appears within functional limits for tasks assessed/performed Arousal/Alertness: Awake/alert Orientation Level: Appears intact for tasks assessed Behavior During Session: [facility] for tasks performed    Extremity/Trunk Assessment Right Upper Extremity Assessment RUE ROM/Strength/Tone: [facility] for tasks assessed Left Upper Extremity Assessment LUE ROM/Strength/Tone: WFL for tasks assessed Right Lower Extremity Assessment RLE ROM/Strength/Tone: WFL for tasks assessed RLE Sensation: WFL - Light Touch RLE Coordination: WFL - gross/fine motor Left Lower Extremity Assessment LLE ROM/Strength/Tone: WFL for tasks assessed LLE Sensation: WFL - Light Touch LLE Coordination: WFL - gross/fine motor Trunk Assessment Trunk Assessment: Normal     Mobility Bed Mobility Bed Mobility: Supine to Sit Supine to Sit: 5: Supervision;With rails;HOB elevated Sitting - Scoot to Edge of Bed: 6: Modified independent (Device/Increase time) Details for Bed Mobility Assistance: cues for hand technique to simulate home environment. Transfers Transfers: Sit to Stand;Stand to Sit Sit to Stand: 6: Modified independent (Device/Increase time) Stand to Sit: 6: Modified independent (Device/Increase time) Details for Transfer Assistance: increased time     Shoulder Instructions     Exercise     Balance     End of Session OT - End of Session Activity Tolerance: Patient tolerated treatment well Patient left: in chair;with call bell/phone within reach;with family/visitor present  GO     [name] A OTR/L [phone] 04/10/2012,  10:54 AM

## 2012-04-10 NOTE — Progress Notes (Signed)
Pt to family auto via w/c. Assessment unchanged from am.

## 2012-04-12 ENCOUNTER — Encounter (HOSPITAL_COMMUNITY): Payer: Self-pay | Admitting: Orthopedic Surgery

## 2012-04-12 NOTE — Progress Notes (Signed)
Last day progress notes in [facility] review, no discharge summery available

## 2012-04-12 NOTE — Progress Notes (Signed)
CARE MANAGEMENT NOTE 04/12/2012  Patient:  [name], [name]   Account Number:  [id number]  Date Initiated:  04/09/2012  Documentation initiated by:  Colleen Can  Subjective/Objective Assessment:   DX HIP REPLACEMNT REVISION-LEFT     Action/Plan:   CM SPOKE WITH PATIENT AND SPOUSE.  plans are for patient to return to his home in [place] where spouse will be caregiver. Already has DME. Wants network HH agency for PT services   Anticipated DC Date:  04/12/2012   Anticipated DC Plan:  HOME W HOME HEALTH SERVICES  In-house referral  NA      DC Planning Services  CM consult      [facility] Choice  HOME HEALTH   Choice offered to / List presented to:  C-1 Patient   DME arranged  NA      DME agency  NA     HH arranged  HH-2 PT      HH agency  [facility]   Status of service:  Completed, signed off Medicare Important Message given?  NO (If response is "NO", the following Medicare IM given date fields will be blank) Date Medicare IM given:   Date Additional Medicare IM given:    Discharge Disposition:  HOME W HOME HEALTH SERVICES  Comments:  04/12/2012 Raynelle Bring BSN CCM [phone] PER CHART REVIEW PT WAS DISCHARGED 09.28/2013. [facility] WILL PROVIDE HH SERVICES. hh ORDERS, H&p, OP NOTE FAXED WITH CONFIRMATION ON  0927/2013.

## 2012-04-15 NOTE — Discharge Summary (Signed)
Physician Discharge Summary   Patient ID: [name] MRN: [id number] DOB/AGE: March 18, 1951 61 y.o.  Admit date: 04/09/2012 Discharge date: 04/10/2012  Primary Diagnosis: Instability, left total hip arthroplasty.   Admission Diagnoses:  Past Medical History  Diagnosis Date  . PONV (postoperative nausea and vomiting)     slow to wake up after  . Hypertension   . Rotator cuff tear, right dec 2012    physical therapy done, decreased strength  . Sleep apnea     does not know settings, last sleep study 5 yrs ago   Discharge Diagnoses:   Principal Problem:  *Instability of prosthetic hip  Procedure: Procedure(s) (LRB): TOTAL HIP REVISION (Left)   Consults: None  HPI: [name] is a 61 year old male, who had a left  total hip arthroplasty with metal on metal and ended up developing a  reaction to the metal. Was revised to ceramic on polyethylene. He  ended up developing instability. He presents now for revision to either  constrained liner, revision of total hip arthroplasty versus revision of  selective components in order to make him more stable.  Laboratory Data: Hospital Outpatient Visit on 04/05/2012  Component Date Value Range Status  . aPTT 04/05/2012 29  24 - 37 seconds Final  . WBC 04/05/2012 8.4  4.0 - 10.5 K/uL Final  . RBC 04/05/2012 5.38  4.22 - 5.81 MIL/uL Final  . Hemoglobin 04/05/2012 15.0  13.0 - 17.0 g/dL Final  . HCT 40/98/1191 44.5  39.0 - 52.0 % Final  . MCV 04/05/2012 82.7  78.0 - 100.0 fL Final  . MCH 04/05/2012 27.9  26.0 - 34.0 pg Final  . MCHC 04/05/2012 33.7  30.0 - 36.0 g/dL Final  . RDW 47/82/9562 13.3  11.5 - 15.5 % Final  . Platelets 04/05/2012 202  150 - 400 K/uL Final  . Sodium 04/05/2012 138  135 - 145 mEq/L Final  . Potassium 04/05/2012 3.8  3.5 - 5.1 mEq/L Final  . Chloride 04/05/2012 100  96 - 112 mEq/L Final  . CO2 04/05/2012 31  19 - 32 mEq/L Final  . Glucose, Bld 04/05/2012 86  70 - 99 mg/dL Final  . BUN 13/02/6577 15  6 - 23  mg/dL Final  . Creatinine, Ser 04/05/2012 0.81  0.50 - 1.35 mg/dL Final  . Calcium 46/96/2952 9.3  8.4 - 10.5 mg/dL Final  . Total Protein 04/05/2012 6.8  6.0 - 8.3 g/dL Final  . Albumin 84/13/2440 4.0  3.5 - 5.2 g/dL Final  . AST 05/10/2535 18  0 - 37 U/L Final  . ALT 04/05/2012 21  0 - 53 U/L Final  . Alkaline Phosphatase 04/05/2012 54  39 - 117 U/L Final  . Total Bilirubin 04/05/2012 0.4  0.3 - 1.2 mg/dL Final  . GFR calc non Af Amer 04/05/2012 >90  >90 mL/min Final  . GFR calc Af Amer 04/05/2012 >90  >90 mL/min Final   Comment:                                 The eGFR has been calculated                          using the CKD EPI equation.                          This calculation has not been  validated in all clinical                          situations.                          eGFR's persistently                          <90 mL/min signify                          possible Chronic Kidney Disease.  Marland Kitchen Prothrombin Time 04/05/2012 12.9  11.6 - 15.2 seconds Final  . INR 04/05/2012 0.98  0.00 - 1.49 Final  . Color, Urine 04/05/2012 YELLOW  YELLOW Final  . APPearance 04/05/2012 CLEAR  CLEAR Final  . Specific Gravity, Urine 04/05/2012 1.017  1.005 - 1.030 Final  . pH 04/05/2012 7.0  5.0 - 8.0 Final  . Glucose, UA 04/05/2012 NEGATIVE  NEGATIVE mg/dL Final  . Hgb urine dipstick 04/05/2012 NEGATIVE  NEGATIVE Final  . Bilirubin Urine 04/05/2012 NEGATIVE  NEGATIVE Final  . Ketones, ur 04/05/2012 NEGATIVE  NEGATIVE mg/dL Final  . Protein, ur 25/36/6440 NEGATIVE  NEGATIVE mg/dL Final  . Urobilinogen, UA 04/05/2012 0.2  0.0 - 1.0 mg/dL Final  . Nitrite 34/74/2595 NEGATIVE  NEGATIVE Final  . Leukocytes, UA 04/05/2012 NEGATIVE  NEGATIVE Final   MICROSCOPIC NOT DONE ON URINES WITH NEGATIVE PROTEIN, BLOOD, LEUKOCYTES, NITRITE, OR GLUCOSE <1000 mg/dL.  Marland Kitchen MRSA, PCR 04/05/2012 NEGATIVE  NEGATIVE Final  . Staphylococcus aureus 04/05/2012 NEGATIVE  NEGATIVE Final    Comment:                                 The Xpert SA Assay (FDA                          approved for NASAL specimens                          in patients over 28 years of age),                          is one component of                          a comprehensive surveillance                          program.  Test performance has                          been validated by [employer] for patients greater                          than or equal to 61 year old.                          It is not intended  to diagnose infection nor to                          guide or monitor treatment.   No results found for this basename: HGB:5 in the last 72 hours No results found for this basename: WBC:2,RBC:2,HCT:2,PLT:2 in the last 72 hours No results found for this basename: NA:2,K:2,CL:2,CO2:2,BUN:2,CREATININE:2,GLUCOSE:2,CALCIUM:2 in the last 72 hours No results found for this basename: LABPT:2,INR:2 in the last 72 hours  X-Rays:Dg Hip Complete Left  04/05/2012  *RADIOLOGY REPORT*  Clinical Data: Preop  LEFT HIP - COMPLETE 2+ VIEW  Comparison: 10/01/2011  Findings: Left total hip arthroplasty is anatomically aligned.  No breakage or loosening of the hardware.  Mild degenerative change of the right hip joint.  Mild degenerative change of the right and left SI joints.  No acute fracture.  No dislocation.  Minimal heterotopic ossification about the left hip joint.  IMPRESSION: No acute bony pathology.  Left total arthroplasty anatomically aligned and without radiographic complication.   Original Report Authenticated By: Donavan Burnet, M.D.    Dg Pelvis Portable  04/09/2012  *RADIOLOGY REPORT*  Clinical Data: Postop left total hip replacement.  PORTABLE PELVIS  Comparison: 04/05/2012.  10/01/2011.  Findings: Revision of the left hip replacement.  Soft tissue drain in place.  No hardware or bony complicating feature.  IMPRESSION: Revision of left hip  replacement.  No complicating feature.   Original Report Authenticated By: Cyndie Chime, M.D.    Dg Hip Portable 1 View Left  04/09/2012  *RADIOLOGY REPORT*  Clinical Data: Postop left total hip replacement.  PORTABLE LEFT HIP - 1 VIEW  Comparison: 04/05/2012  Findings: Revision of the previously seen left hip replacement.  No hardware or bony complicating feature.  Surgical drain in place.  IMPRESSION: Revision of left hip replacement.  No acute findings.   Original Report Authenticated By: Cyndie Chime, M.D.     EKG: Orders placed during the hospital encounter of 04/09/12  . EKG     Hospital Course: Patient was admitted to [facility] and taken to the OR and underwent the above state procedure without complications.  Patient tolerated the procedure well and was later transferred to the recovery room and then to the orthopaedic floor for postoperative care.  They were given PO and IV analgesics for pain control following their surgery.  They were given 24 hours of postoperative antibiotics and started on DVT prophylaxis in the form of Xarelto.   PT and OT were ordered for total hip protocol.  The patient was allowed to be WBAT with therapy. Discharge planning was consulted to help with postop disposition and equipment needs.  Patient had a good night on the evening of surgery and started to get up OOB with therapy on day one.  Hemovac drain was pulled without difficulty.  The knee immobilizer was removed and discontinued.  Patient was seen in rounds by the weekend coverage staff and he was ready to go home.  Discharge Medications: Prior to Admission medications   Medication Sig Start Date End Date Taking? Authorizing Provider  acidophilus (RISAQUAD) CAPS Take 1 capsule by mouth daily.   Yes Historical Provider, MD  hydrochlorothiazide (MICROZIDE) 12.5 MG capsule Take 12.5 mg by mouth every morning.   Yes Historical Provider, MD  lisinopril (PRINIVIL,ZESTRIL) 40 MG tablet Take 40 mg by  mouth every morning.   Yes Historical Provider, MD  Multiple Vitamin (MULTIVITAMIN WITH MINERALS) TABS Take 1  tablet by mouth daily.   Yes Historical Provider, MD  HYDROcodone-acetaminophen (NORCO) 7.5-325 MG per tablet Take 1-2 tablets by mouth every 4 (four) hours as needed (breakthrough pain). 04/09/12   Loanne Drilling, MD  methocarbamol (ROBAXIN) 500 MG tablet Take 1 tablet (500 mg total) by mouth every 6 (six) hours as needed. 04/09/12   Loanne Drilling, MD  rivaroxaban (XARELTO) 10 MG TABS tablet Take 1 tablet (10 mg total) by mouth daily with breakfast. 04/09/12   Loanne Drilling, MD    Diet: Cardiac diet Activity:WBAT No bending hip over 90 degrees- A "L" Angle Do not cross legs Do not let foot roll inward When turning these patients a pillow should be placed between the patient's legs to prevent crossing. Patients should have the affected knee fully extended when trying to sit or stand from all surfaces to prevent excessive hip flexion. When ambulating and turning toward the affected side the affected leg should have the toes turned out prior to moving the walker and the rest of patient's body as to prevent internal rotation/ turning in of the leg. Abduction pillows are the most effective way to prevent a patient from not crossing legs or turning toes in at rest. If an abduction pillow is not ordered placing a regular pillow length wise between the patient's legs is also an effective reminder. It is imperative that these precautions be maintained so that the surgical hip does not dislocate. Follow-up:in 2 weeks Disposition - Home Discharged Condition: good      Medication List     As of 04/15/2012 10:58 AM    TAKE these medications         acidophilus Caps   Take 1 capsule by mouth daily.      hydrochlorothiazide 12.5 MG capsule   Commonly known as: MICROZIDE   Take 12.5 mg by mouth every morning.      HYDROcodone-acetaminophen 7.5-325 MG per tablet   Commonly known as:  NORCO   Take 1-2 tablets by mouth every 4 (four) hours as needed (breakthrough pain).      lisinopril 40 MG tablet   Commonly known as: PRINIVIL,ZESTRIL   Take 40 mg by mouth every morning.      methocarbamol 500 MG tablet   Commonly known as: ROBAXIN   Take 1 tablet (500 mg total) by mouth every 6 (six) hours as needed.      multivitamin with minerals Tabs   Take 1 tablet by mouth daily.      rivaroxaban 10 MG Tabs tablet   Commonly known as: XARELTO   Take 1 tablet (10 mg total) by mouth daily with breakfast.           Follow-up Information    Follow up with Loanne Drilling, MD. In 2 weeks. (call [phone] Monday to make the appointment)    Contact information:   [facility] [address] [place] [address] [phone]          Signed: Patrica Duel 04/15/2012, 10:58 AM

## 2012-09-27 ENCOUNTER — Other Ambulatory Visit: Payer: Self-pay | Admitting: Orthopedic Surgery

## 2012-11-09 ENCOUNTER — Ambulatory Visit (HOSPITAL_BASED_OUTPATIENT_CLINIC_OR_DEPARTMENT_OTHER)
Admission: RE | Admit: 2012-11-09 | Payer: PRIVATE HEALTH INSURANCE | Source: Ambulatory Visit | Admitting: Orthopedic Surgery

## 2012-11-09 ENCOUNTER — Encounter (HOSPITAL_BASED_OUTPATIENT_CLINIC_OR_DEPARTMENT_OTHER): Admission: RE | Payer: Self-pay | Source: Ambulatory Visit

## 2012-11-09 SURGERY — CARPAL TUNNEL RELEASE
Anesthesia: Regional | Site: Wrist | Laterality: Right

## 2013-05-03 ENCOUNTER — Other Ambulatory Visit (HOSPITAL_COMMUNITY): Payer: Self-pay | Admitting: Internal Medicine

## 2013-05-03 DIAGNOSIS — I499 Cardiac arrhythmia, unspecified: Secondary | ICD-10-CM

## 2013-05-04 ENCOUNTER — Ambulatory Visit (HOSPITAL_COMMUNITY)
Admission: RE | Admit: 2013-05-04 | Discharge: 2013-05-04 | Disposition: A | Discharge status: Home / Self Care | Payer: BC Managed Care – PPO | Source: Ambulatory Visit | Attending: Cardiovascular Disease | Admitting: Cardiovascular Disease

## 2013-05-04 DIAGNOSIS — Z8249 Family history of ischemic heart disease and other diseases of the circulatory system: Secondary | ICD-10-CM | POA: Insufficient documentation

## 2013-05-04 DIAGNOSIS — I4891 Unspecified atrial fibrillation: Secondary | ICD-10-CM

## 2013-05-04 DIAGNOSIS — I10 Essential (primary) hypertension: Secondary | ICD-10-CM | POA: Insufficient documentation

## 2013-05-04 DIAGNOSIS — I080 Rheumatic disorders of both mitral and aortic valves: Secondary | ICD-10-CM | POA: Insufficient documentation

## 2013-05-04 DIAGNOSIS — I379 Nonrheumatic pulmonary valve disorder, unspecified: Secondary | ICD-10-CM | POA: Insufficient documentation

## 2013-05-04 DIAGNOSIS — I079 Rheumatic tricuspid valve disease, unspecified: Secondary | ICD-10-CM | POA: Insufficient documentation

## 2013-05-04 DIAGNOSIS — I499 Cardiac arrhythmia, unspecified: Secondary | ICD-10-CM

## 2013-05-04 NOTE — Progress Notes (Signed)
2D Echo Performed 05/04/2013    Summit Borchardt, RCS  

## 2013-05-11 ENCOUNTER — Encounter: Payer: Self-pay | Admitting: Cardiovascular Disease

## 2013-05-11 ENCOUNTER — Ambulatory Visit (INDEPENDENT_AMBULATORY_CARE_PROVIDER_SITE_OTHER): Payer: BC Managed Care – PPO | Admitting: Cardiovascular Disease

## 2013-05-11 VITALS — BP 160/100 | HR 94 | Ht 68.0 in | Wt 281.0 lb

## 2013-05-11 DIAGNOSIS — D689 Coagulation defect, unspecified: Secondary | ICD-10-CM

## 2013-05-11 DIAGNOSIS — I4891 Unspecified atrial fibrillation: Secondary | ICD-10-CM

## 2013-05-11 DIAGNOSIS — Z01818 Encounter for other preprocedural examination: Secondary | ICD-10-CM

## 2013-05-11 DIAGNOSIS — I10 Essential (primary) hypertension: Secondary | ICD-10-CM

## 2013-05-11 DIAGNOSIS — I4819 Other persistent atrial fibrillation: Secondary | ICD-10-CM | POA: Insufficient documentation

## 2013-05-11 DIAGNOSIS — G4733 Obstructive sleep apnea (adult) (pediatric): Secondary | ICD-10-CM | POA: Insufficient documentation

## 2013-05-11 DIAGNOSIS — Z79899 Other long term (current) drug therapy: Secondary | ICD-10-CM

## 2013-05-11 MED ORDER — RIVAROXABAN 20 MG PO TABS: 20.0000 mg | ORAL_TABLET | Freq: Every day | ORAL | Status: DC

## 2013-05-11 MED ORDER — METOPROLOL SUCCINATE ER 50 MG PO TB24: 50.0000 mg | ORAL_TABLET | Freq: Every day | ORAL | Status: DC

## 2013-05-11 NOTE — Assessment & Plan Note (Signed)
New onset 2 weeks ago while climbing [place]. The time he felt short of breath. He saw his primary care physician, Dr. Nehemiah Settle, who noted new onset A. Fib and began him on low-dose beta blockade. His [place] score is 1.I am going to get a pharmacologic Myoview stress test to rule out ischemic etiology, begin him on a novel oral anticoagulant area to undergo outpatient direct current cardioversion in 4-6 weeks. His echo did show normal LV function only had moderate to severe left atrial enlargement.

## 2013-05-11 NOTE — Patient Instructions (Signed)
Dr Allyson Sabal has ordered a lexiscan myoview to be done.  Start xarelto 20mg  daily  Increase the metoprolol to 50mg  daily  We are planning to do a cardioversion in 4 weeks to shock your heart back into rhythm.

## 2013-05-11 NOTE — Progress Notes (Signed)
   05/11/2013 [name]   05/19/1951  [id number]  Primary Physician POLITE,RONALD D, MD Primary Cardiologist: Rashena Dowling J. Pate Aylward MD FACP,FACC,FAHA, FSCAI   HPI:  [name] is a 62-year-old severely overweight married Caucasian male father of 3, grandfather to 3 grandchildren works as a leadership development consultant. He is referred by Dr. Ron Polite for evaluation of new-onset atrial fibrillation. His factors include hypertension. He has obstructive sleep apnea on CPAP. He said 3 to right hip replacements in the past. He's never had a heart attack or stroke he denies chest pain or shortness of breath. Dr. Polite began him on low-dose beta blockade.   Current Outpatient Prescriptions  Medication Sig Dispense Refill  . aspirin 81 MG tablet Take 81 mg by mouth daily.      . hydrochlorothiazide (MICROZIDE) 12.5 MG capsule Take 12.5 mg by mouth every morning.      . lisinopril (PRINIVIL,ZESTRIL) 40 MG tablet Take 40 mg by mouth every morning.      . metoprolol succinate (TOPROL-XL) 50 MG 24 hr tablet Take 1 tablet (50 mg total) by mouth daily.  30 tablet  6  . Multiple Vitamin (MULTIVITAMIN WITH MINERALS) TABS Take 1 tablet by mouth daily.      . Rivaroxaban (XARELTO) 20 MG TABS tablet Take 1 tablet (20 mg total) by mouth daily.  30 tablet  6   No current facility-administered medications for this visit.    Allergies  Allergen Reactions  . Oxycodone Rash    History   Social History  . Marital Status: Married    Spouse Name: N/A    Number of Children: N/A  . Years of Education: N/A   Occupational History  . Not on file.   Social History Main Topics  . Smoking status: Never Smoker   . Smokeless tobacco: Never Used  . Alcohol Use: No  . Drug Use: No  . Sexual Activity:    Other Topics Concern  . Not on file   Social History Narrative  . No narrative on file     Review of Systems: General: negative for chills, fever, night sweats or weight changes.    Cardiovascular: negative for chest pain, dyspnea on exertion, edema, orthopnea, palpitations, paroxysmal nocturnal dyspnea or shortness of breath Dermatological: negative for rash Respiratory: negative for cough or wheezing Urologic: negative for hematuria Abdominal: negative for nausea, vomiting, diarrhea, bright red blood per rectum, melena, or hematemesis Neurologic: negative for visual changes, syncope, or dizziness All other systems reviewed and are otherwise negative except as noted above.    Blood pressure 160/100, pulse 94, height 5' 8" (1.727 m), weight 281 lb (127.461 kg).  General appearance: alert and no distress Neck: no adenopathy, no carotid bruit, no JVD, supple, symmetrical, trachea midline and thyroid not enlarged, symmetric, no tenderness/mass/nodules Lungs: clear to auscultation bilaterally Heart: regular rate and rhythm, S1, S2 normal, no murmur, click, rub or gallop Abdomen: soft, non-tender; bowel sounds normal; no masses,  no organomegaly Extremities: extremities normal, atraumatic, no cyanosis or edema Pulses: 2+ and symmetric  EKG atrial fibrillation with a ventricular response of 94  ASSESSMENT AND PLAN:   Obstructive sleep apnea On CPAP  Atrial fibrillation New onset 2 weeks ago [place]. The time he felt short of breath. He saw his primary care physician, Dr. Polite, who noted new onset A. Fib and began him on low-dose beta blockade. His CHAD score is 1.I am going to get a pharmacologic Myoview stress test to   rule out ischemic etiology, begin him on a novel oral anticoagulant area to undergo outpatient direct current cardioversion in 4-6 weeks. His echo did show normal LV function only had moderate to severe left atrial enlargement.      [name] J. Undine Nealis MD FACP,FACC,FAHA, FSCAI 05/11/2013 5:02 PM  

## 2013-05-11 NOTE — Assessment & Plan Note (Signed)
On CPAP. ?

## 2013-05-12 ENCOUNTER — Encounter: Payer: Self-pay | Admitting: Cardiovascular Disease

## 2013-05-13 ENCOUNTER — Encounter: Payer: Self-pay | Admitting: Cardiovascular Disease

## 2013-05-13 ENCOUNTER — Telehealth: Payer: Self-pay | Admitting: Cardiovascular Disease

## 2013-05-13 NOTE — Telephone Encounter (Signed)
Please call-have some concerns about the side effects of Xarelto.

## 2013-05-13 NOTE — Telephone Encounter (Signed)
Patient was concerned about the commercials on TV about Xarelto. Informed patient his doctor had outweighted the benefits of him taking the medication.  He was reassured.

## 2013-05-24 ENCOUNTER — Ambulatory Visit (HOSPITAL_COMMUNITY)
Admission: RE | Admit: 2013-05-24 | Discharge: 2013-05-24 | Disposition: A | Discharge status: Home / Self Care | Payer: BC Managed Care – PPO | Source: Ambulatory Visit | Attending: Cardiovascular Disease | Admitting: Cardiovascular Disease

## 2013-05-24 DIAGNOSIS — I4891 Unspecified atrial fibrillation: Secondary | ICD-10-CM | POA: Insufficient documentation

## 2013-05-24 MED ORDER — REGADENOSON 0.4 MG/5ML IV SOLN
0.4000 mg | Freq: Once | INTRAVENOUS | Status: AC
  Administered 2013-05-24: 0.4 mg via INTRAVENOUS

## 2013-05-24 MED ORDER — TECHNETIUM TC 99M SESTAMIBI GENERIC - CARDIOLITE
30.0000 | Freq: Once | INTRAVENOUS | Status: AC | PRN
  Administered 2013-05-24: 30 via INTRAVENOUS

## 2013-05-24 MED ORDER — TECHNETIUM TC 99M SESTAMIBI GENERIC - CARDIOLITE
10.0000 | Freq: Once | INTRAVENOUS | Status: AC | PRN
  Administered 2013-05-24: 10 via INTRAVENOUS

## 2013-05-24 NOTE — Procedures (Addendum)
[facility] [address] [place] [address] [phone]  Cardiology Nuclear Med Study  [name] is a 62 y.o. male     MRN : [id number]     DOB: 05/15/1951  Procedure Date: 05/24/2013  Nuclear Med Background Indication for Stress Test:  Evaluation for Ischemia, Abnormal EKG and new onset A-Fib History:  New onset A-Fib. Cardiac Risk Factors: Hypertension and Obesity  Symptoms:  Dizziness, DOE, Fatigue, Light-Headedness, Palpitations and SOB   Nuclear Pre-Procedure Caffeine/Decaff Intake:  12:00am NPO After: 10am   IV Site: R Hand  IV 0.9% NS with Angio Cath:  22g  Chest Size (in):  48"  IV Started by: Emmit Pomfret, RN  Height: 5\' 8"  (1.727 m)  Cup Size: n/a  BMI:  Body mass index is 42.74 kg/(m^2). Weight:  281 lb (127.461 kg)   Tech Comments:  n/a    Nuclear Med Study 1 or 2 day study: 1 day  Stress Test Type:  Lexiscan  Order Authorizing Provider:  Nanetta Batty, MD   Resting Radionuclide: Technetium 66m Sestamibi  Resting Radionuclide Dose: 10.3 mCi   Stress Radionuclide:  Technetium 85m Sestamibi  Stress Radionuclide Dose: 30.0 mCi           Stress Protocol Rest HR: 85 Stress HR: 127  Rest BP: 158/95 Stress BP: 147/103  Exercise Time (min): n/a METS: n/a   Predicted Max HR: 160 bpm % Max HR: 79.38 bpm Rate Pressure Product: 69629  Dose of Adenosine (mg):  n/a Dose of Lexiscan: 0.4 mg  Dose of Atropine (mg): n/a Dose of Dobutamine: n/a mcg/kg/min (at max HR)  Stress Test Technologist: Esperanza Sheets, CCT Nuclear Technologist: Koren Shiver, CNMT   Rest Procedure:  Myocardial perfusion imaging was performed at rest 45 minutes following the intravenous administration of Technetium 70m Sestamibi. Stress Procedure:  The patient received IV Lexiscan 0.4 mg over 15-seconds.  Technetium 84m Sestamibi injected at 30-seconds.  There were no significant changes with Lexiscan.  Quantitative spect images  were obtained after a 45 minute delay.  Transient Ischemic Dilatation (Normal <1.22):  1.20 Lung/Heart Ratio (Normal <0.45):  0.36 QGS EDV:  n/a ml QGS ESV:  n/a ml LV Ejection Fraction: Study not gated  Signed by      Rest ECG: Atrial Fibrillation at 85  Stress ECG: No significant change from baseline ECG  QPS Raw Data Images:  Normal; no motion artifact; normal heart/lung ratio. Stress Images:  Moderate in size and intensity inferior defect Rest Images:  Comparison with the stress images reveals inferior defect with additional diaphragmatic attenuation. Subtraction (SDS):  No evidence of ischemia.  Impression Exercise Capacity:  Lexiscan with no exercise. BP Response:  Normal blood pressure response. Clinical Symptoms:  Mild shortness of breath ECG Impression:  No significant ECG changes with Lexiscan. Comparison with Prior Nuclear Study: No images to compare  Overall Impression:  Low risk stress nuclear study demonstrating focal inferior scar extending distally to the upper mid region without ischemia.  LV Wall Motion:  Not gated secondary to AF.   Lennette Bihari, MD  05/24/2013 5:57 PM  +

## 2013-05-30 ENCOUNTER — Telehealth: Payer: Self-pay | Admitting: Cardiovascular Disease

## 2013-05-30 NOTE — Telephone Encounter (Signed)
Message forwarded to K. Vogel, RN.  

## 2013-05-30 NOTE — Telephone Encounter (Signed)
Had stress test last week.  Would like to get the results.  Please call

## 2013-05-30 NOTE — Telephone Encounter (Signed)
Spoke with patient and results given. 

## 2013-06-03 ENCOUNTER — Other Ambulatory Visit: Payer: Self-pay | Admitting: Orthopedic Surgery

## 2013-06-04 LAB — CBC
HCT: 45.7 % (ref 39.0–52.0)
Hemoglobin: 15.9 g/dL (ref 13.0–17.0)
MCH: 27.8 pg (ref 26.0–34.0)
MCHC: 34.8 g/dL (ref 30.0–36.0)
MCV: 80 fL (ref 78.0–100.0)
Platelets: 178 K/uL (ref 150–400)
RBC: 5.71 MIL/uL (ref 4.22–5.81)
RDW: 14.3 % (ref 11.5–15.5)
WBC: 9.6 K/uL (ref 4.0–10.5)

## 2013-06-04 LAB — PROTIME-INR
INR: 1.46
Prothrombin Time: 17.5 s — ABNORMAL HIGH (ref 11.6–15.2)

## 2013-06-04 LAB — APTT: aPTT: 34 s (ref 24–37)

## 2013-06-04 LAB — BASIC METABOLIC PANEL
Calcium: 9.5 mg/dL (ref 8.4–10.5)
Creat: 0.89 mg/dL (ref 0.50–1.35)

## 2013-06-04 LAB — TSH: TSH: 2.097 u[IU]/mL (ref 0.350–4.500)

## 2013-06-05 MED ORDER — DEXTROSE 5 % IV SOLN
3.0000 g | INTRAVENOUS | Status: DC
  Filled 2013-06-05: qty 3000

## 2013-06-06 ENCOUNTER — Encounter (HOSPITAL_COMMUNITY): Payer: Self-pay

## 2013-06-06 ENCOUNTER — Encounter (HOSPITAL_COMMUNITY)
Admission: RE | Disposition: A | Discharge status: Home / Self Care | Payer: Self-pay | Source: Ambulatory Visit | Attending: Cardiovascular Disease

## 2013-06-06 ENCOUNTER — Ambulatory Visit: Admit: 2013-06-06 | Payer: Self-pay | Admitting: Cardiovascular Disease

## 2013-06-06 ENCOUNTER — Encounter: Payer: Self-pay | Admitting: *Deleted

## 2013-06-06 ENCOUNTER — Encounter (HOSPITAL_COMMUNITY): Payer: BC Managed Care – PPO | Admitting: Certified Registered Nurse Anesthetist

## 2013-06-06 ENCOUNTER — Ambulatory Visit (HOSPITAL_COMMUNITY): Payer: BC Managed Care – PPO | Admitting: Certified Registered Nurse Anesthetist

## 2013-06-06 ENCOUNTER — Ambulatory Visit (HOSPITAL_COMMUNITY)
Admission: RE | Admit: 2013-06-06 | Discharge: 2013-06-06 | Disposition: A | Discharge status: Home / Self Care | Payer: BC Managed Care – PPO | Source: Ambulatory Visit | Attending: Cardiovascular Disease | Admitting: Cardiovascular Disease

## 2013-06-06 DIAGNOSIS — I4891 Unspecified atrial fibrillation: Secondary | ICD-10-CM

## 2013-06-06 DIAGNOSIS — G4733 Obstructive sleep apnea (adult) (pediatric): Secondary | ICD-10-CM | POA: Insufficient documentation

## 2013-06-06 DIAGNOSIS — I10 Essential (primary) hypertension: Secondary | ICD-10-CM | POA: Insufficient documentation

## 2013-06-06 DIAGNOSIS — Z7982 Long term (current) use of aspirin: Secondary | ICD-10-CM | POA: Insufficient documentation

## 2013-06-06 DIAGNOSIS — Z01818 Encounter for other preprocedural examination: Secondary | ICD-10-CM

## 2013-06-06 DIAGNOSIS — E663 Overweight: Secondary | ICD-10-CM | POA: Insufficient documentation

## 2013-06-06 DIAGNOSIS — Z7901 Long term (current) use of anticoagulants: Secondary | ICD-10-CM | POA: Insufficient documentation

## 2013-06-06 HISTORY — PX: CARDIOVERSION: SHX1299

## 2013-06-06 SURGERY — CARDIOVERSION
Anesthesia: General

## 2013-06-06 SURGERY — CARDIOVERSION
Anesthesia: Monitor Anesthesia Care

## 2013-06-06 MED ORDER — SODIUM CHLORIDE 0.9 % IV SOLN
INTRAVENOUS | Status: DC | PRN
  Administered 2013-06-06: 14:00:00 via INTRAVENOUS

## 2013-06-06 MED ORDER — SODIUM CHLORIDE 0.9 % IV SOLN: INTRAVENOUS | Status: DC

## 2013-06-06 MED ORDER — PROPOFOL 10 MG/ML IV BOLUS
INTRAVENOUS | Status: DC | PRN
  Administered 2013-06-06: 100 mg via INTRAVENOUS

## 2013-06-06 NOTE — CV Procedure (Signed)
Procedure: Electrical Cardioversion Indications:  Atrial Fibrillation  Procedure Details:  Consent: Risks of procedure as well as the alternatives and risks of each were explained to the (patient/caregiver).  Consent for procedure obtained.  Time Out: Verified patient identification, verified procedure, site/side was marked, verified correct patient position, special equipment/implants available, medications/allergies/relevent history reviewed, required imaging and test results available.  Performed  Patient placed on cardiac monitor, pulse oximetry, supplemental oxygen as necessary.  Sedation given: propofol 100 mg IV, Dr. Gelene Mink Pacer pads placed anterior and posterior chest.  Cardioverted 1 time(s).  Cardioverted at 120J, synchronized biphasic  Evaluation: Findings: Post procedure EKG shows: NSR Complications: None Patient did tolerate procedure well.  Time Spent Directly with the Patient:  35  minutes   [name] 06/06/2013, 2:22 PM

## 2013-06-06 NOTE — H&P (View-Only) (Signed)
05/11/2013 [name]   08/10/1950  [id number]  Primary Physician Katy Apo, MD Primary Cardiologist: Runell Gess MD Roseanne Reno   HPI:  [name] is a 62 year old severely overweight married Caucasian male father of 3, grandfather to 3 grandchildren works as a Scientist, forensic. He is referred by Dr. Trula Slade for evaluation of new-onset atrial fibrillation. His factors include hypertension. He has obstructive sleep apnea on CPAP. He said 3 to right hip replacements in the past. He's never had a heart attack or stroke he denies chest pain or shortness of breath. Dr. Nehemiah Settle began him on low-dose beta blockade.   Current Outpatient Prescriptions  Medication Sig Dispense Refill  . aspirin 81 MG tablet Take 81 mg by mouth daily.      . hydrochlorothiazide (MICROZIDE) 12.5 MG capsule Take 12.5 mg by mouth every morning.      Marland Kitchen lisinopril (PRINIVIL,ZESTRIL) 40 MG tablet Take 40 mg by mouth every morning.      . metoprolol succinate (TOPROL-XL) 50 MG 24 hr tablet Take 1 tablet (50 mg total) by mouth daily.  30 tablet  6  . Multiple Vitamin (MULTIVITAMIN WITH MINERALS) TABS Take 1 tablet by mouth daily.      . Rivaroxaban (XARELTO) 20 MG TABS tablet Take 1 tablet (20 mg total) by mouth daily.  30 tablet  6   No current facility-administered medications for this visit.    Allergies  Allergen Reactions  . Oxycodone Rash    History   Social History  . Marital Status: Married    Spouse Name: N/A    Number of Children: N/A  . Years of Education: N/A   Occupational History  . Not on file.   Social History Main Topics  . Smoking status: Never Smoker   . Smokeless tobacco: Never Used  . Alcohol Use: No  . Drug Use: No  . Sexual Activity:    Other Topics Concern  . Not on file   Social History Narrative  . No narrative on file     Review of Systems: General: negative for chills, fever, night sweats or weight changes.    Cardiovascular: negative for chest pain, dyspnea on exertion, edema, orthopnea, palpitations, paroxysmal nocturnal dyspnea or shortness of breath Dermatological: negative for rash Respiratory: negative for cough or wheezing Urologic: negative for hematuria Abdominal: negative for nausea, vomiting, diarrhea, bright red blood per rectum, melena, or hematemesis Neurologic: negative for visual changes, syncope, or dizziness All other systems reviewed and are otherwise negative except as noted above.    Blood pressure 160/100, pulse 94, height 5\' 8"  (1.727 m), weight 281 lb (127.461 kg).  General appearance: alert and no distress Neck: no adenopathy, no carotid bruit, no JVD, supple, symmetrical, trachea midline and thyroid not enlarged, symmetric, no tenderness/mass/nodules Lungs: clear to auscultation bilaterally Heart: regular rate and rhythm, S1, S2 normal, no murmur, click, rub or gallop Abdomen: soft, non-tender; bowel sounds normal; no masses,  no organomegaly Extremities: extremities normal, atraumatic, no cyanosis or edema Pulses: 2+ and symmetric  EKG atrial fibrillation with a ventricular response of 94  ASSESSMENT AND PLAN:   Obstructive sleep apnea On CPAP  Atrial fibrillation New onset 2 weeks ago while climbing grandfather [place]. The time he felt short of breath. He saw his primary care physician, Dr. Nehemiah Settle, who noted new onset A. Fib and began him on low-dose beta blockade. His [place] score is 1.I am going to get a pharmacologic Myoview stress test to  rule out ischemic etiology, begin him on a novel oral anticoagulant area to undergo outpatient direct current cardioversion in 4-6 weeks. His echo did show normal LV function only had moderate to severe left atrial enlargement.      Runell Gess MD FACP,FACC,FAHA, [facility] 05/11/2013 5:02 PM

## 2013-06-06 NOTE — Anesthesia Postprocedure Evaluation (Signed)
  Anesthesia Post-op Note  Patient: [name]  Procedure(s) Performed: Procedure(s): CARDIOVERSION (N/A)  Patient Location: PACU and Endoscopy Unit  Anesthesia Type:General  Level of Consciousness: awake, alert , oriented, patient cooperative and responds to stimulation  Airway and Oxygen Therapy: Patient Spontanous Breathing and Patient connected to nasal cannula oxygen  Post-op Pain: none  Post-op Assessment: Post-op Vital signs reviewed, Patient's Cardiovascular Status Stable and Respiratory Function Stable  Post-op Vital Signs: Reviewed and stable  Complications: No apparent anesthesia complications

## 2013-06-06 NOTE — Anesthesia Preprocedure Evaluation (Signed)
Anesthesia Evaluation  Patient identified by MRN, date of birth, ID band Patient awake    Reviewed: Allergy & Precautions, H&P , NPO status , Patient's Chart, lab work & pertinent test results, reviewed documented beta blocker date and time   History of Anesthesia Complications (+) PONV  Airway Mallampati: II TM Distance: >3 FB Neck ROM: Full    Dental  (+) Teeth Intact and Dental Advisory Given   Pulmonary shortness of breath, sleep apnea ,  breath sounds clear to auscultation        Cardiovascular hypertension, Pt. on medications and Pt. on home beta blockers + dysrhythmias Atrial Fibrillation Rhythm:Regular Rate:Normal     Neuro/Psych    GI/Hepatic Neg liver ROS,   Endo/Other  negative endocrine ROS  Renal/GU negative Renal ROS     Musculoskeletal   Abdominal (+) + obese,   Peds  Hematology negative hematology ROS (+)   Anesthesia Other Findings   Reproductive/Obstetrics                           Anesthesia Physical Anesthesia Plan  ASA: II  Anesthesia Plan: General   Post-op Pain Management:    Induction: Intravenous  Airway Management Planned: Mask  Additional Equipment:   Intra-op Plan:   Post-operative Plan:   Informed Consent: I have reviewed the patients History and Physical, chart, labs and discussed the procedure including the risks, benefits and alternatives for the proposed anesthesia with the patient or authorized representative who has indicated his/her understanding and acceptance.   Dental advisory given  Plan Discussed with: Anesthesiologist and Surgeon  Anesthesia Plan Comments:         Anesthesia Quick Evaluation

## 2013-06-06 NOTE — Interval H&P Note (Signed)
History and Physical Interval Note:  06/06/2013 2:22 PM  [name]  has presented today for surgery, with the diagnosis of afib  The various methods of treatment have been discussed with the patient and family. After consideration of risks, benefits and other options for treatment, the patient has consented to  Procedure(s): CARDIOVERSION (N/A) as a surgical intervention .  The patient's history has been reviewed, patient examined, no change in status, stable for surgery.  I have reviewed the patient's chart and labs.  Questions were answered to the patient's satisfaction.     [name]

## 2013-06-06 NOTE — Preoperative (Signed)
Beta Blockers   Reason not to administer Beta Blockers:Not Applicable 

## 2013-06-06 NOTE — Transfer of Care (Signed)
Immediate Anesthesia Transfer of Care Note  Patient: [name]  Procedure(s) Performed: Procedure(s): CARDIOVERSION (N/A)  Patient Location: PACU and Endoscopy Unit  Anesthesia Type:General  Level of Consciousness: awake, oriented, patient cooperative and responds to stimulation  Airway & Oxygen Therapy: Patient Spontanous Breathing and Patient connected to nasal cannula oxygen  Post-op Assessment: Report given to PACU RN and Post -op Vital signs reviewed and stable  Post vital signs: Reviewed and stable  Complications: No apparent anesthesia complications

## 2013-06-07 ENCOUNTER — Encounter (HOSPITAL_COMMUNITY): Payer: Self-pay | Admitting: Cardiovascular Disease

## 2013-06-27 ENCOUNTER — Encounter (HOSPITAL_BASED_OUTPATIENT_CLINIC_OR_DEPARTMENT_OTHER): Payer: Self-pay

## 2013-06-27 ENCOUNTER — Ambulatory Visit (HOSPITAL_BASED_OUTPATIENT_CLINIC_OR_DEPARTMENT_OTHER): Admit: 2013-06-27 | Payer: Self-pay | Admitting: Orthopedic Surgery

## 2013-06-27 SURGERY — CARPAL TUNNEL RELEASE
Anesthesia: Regional | Laterality: Right

## 2013-06-30 ENCOUNTER — Ambulatory Visit (INDEPENDENT_AMBULATORY_CARE_PROVIDER_SITE_OTHER): Payer: BC Managed Care – PPO | Admitting: Cardiovascular Disease

## 2013-06-30 ENCOUNTER — Encounter: Payer: Self-pay | Admitting: Cardiovascular Disease

## 2013-06-30 VITALS — BP 112/60 | HR 93 | Ht 70.0 in | Wt 285.2 lb

## 2013-06-30 DIAGNOSIS — I4891 Unspecified atrial fibrillation: Secondary | ICD-10-CM

## 2013-06-30 DIAGNOSIS — I10 Essential (primary) hypertension: Secondary | ICD-10-CM

## 2013-06-30 NOTE — Patient Instructions (Signed)
  We will see you back in follow up in 3 months with Dr Allyson Sabal  Dr Allyson Sabal has ordered for you to be seen by Dr Johney Frame for an evaluation for your AFIB

## 2013-06-30 NOTE — Assessment & Plan Note (Signed)
Patient was sent to me for new onset atrial fibrillation. He is symptomatic from this. I began him on Xarelto by mouth oral anticoagulation and he underwent outpatient her current cardioversion by Dr. Royann Shivers  on 1124 successfully to sinus rhythm with 1 shock (120 J). He currently is back in atrial fibrillation with controlled ventricular response symptomatic from this. A 2-D echo revealed normal LV systolic function with moderate to  Severe left atrial lodgment. A Myoview stress test showed inferior scar without ischemia.I'm going to refer him to Dr. Fayrene Fearing and for consideration of A. Fib ablation. He may need to be put on an oral intake arrhythmic medication prior to that. He also may require cardiac catheterization to further define his coronary anatomy given the fact of the eschar on his Myoview stress test suggesting a silent old in for myocardial infarction. This could also be a false positive and artifact.

## 2013-06-30 NOTE — Progress Notes (Signed)
06/30/2013 [name]   1951/06/30  [id number]  Primary Physician Katy Apo, MD Primary Cardiologist: Runell Gess MD Roseanne Reno    HPI:  [name] is a 62 year old severely overweight married Caucasian male father of 3, grandfather to 3 grandchildren works as a Scientist, forensic. He is referred by Dr. Trula Slade for evaluation of new-onset atrial fibrillation. His factors include hypertension. He has obstructive sleep apnea on CPAP. He said 3 to right hip replacements in the past. He's never had a heart attack or stroke he denies chest pain or shortness of breath. Dr. Nehemiah Settle began him on low-dose beta blockade. I began him on Xarelto oral anticoagulation. He'll ultimately underwent outpatient direct current cardioversion by Dr. Royann Shivers with 1 shock of 120 J successfully to sinus rhythm. He ultimately reverted back to atrial fibrillation. A 2-D echocardiogram revealed normal LV systolic function with moderate to severe left atrial lodgment. A Myoview stress test suggested inferior scar without ischemia.   Current Outpatient Prescriptions  Medication Sig Dispense Refill  . aspirin 81 MG tablet Take 81 mg by mouth daily.      . hydrochlorothiazide (MICROZIDE) 12.5 MG capsule Take 12.5 mg by mouth every morning.      Marland Kitchen lisinopril (PRINIVIL,ZESTRIL) 40 MG tablet Take 40 mg by mouth every morning.      . metoprolol succinate (TOPROL-XL) 50 MG 24 hr tablet Take 1 tablet (50 mg total) by mouth daily.  30 tablet  6  . Rivaroxaban (XARELTO) 20 MG TABS tablet Take 1 tablet (20 mg total) by mouth daily.  30 tablet  6   No current facility-administered medications for this visit.    Allergies  Allergen Reactions  . Oxycodone Rash    History   Social History  . Marital Status: Married    Spouse Name: N/A    Number of Children: N/A  . Years of Education: N/A   Occupational History  . Not on file.   Social History Main Topics  . Smoking  status: Never Smoker   . Smokeless tobacco: Never Used  . Alcohol Use: No  . Drug Use: No  . Sexual Activity:    Other Topics Concern  . Not on file   Social History Narrative  . No narrative on file     Review of Systems: General: negative for chills, fever, night sweats or weight changes.  Cardiovascular: negative for chest pain, dyspnea on exertion, edema, orthopnea, palpitations, paroxysmal nocturnal dyspnea or shortness of breath Dermatological: negative for rash Respiratory: negative for cough or wheezing Urologic: negative for hematuria Abdominal: negative for nausea, vomiting, diarrhea, bright red blood per rectum, melena, or hematemesis Neurologic: negative for visual changes, syncope, or dizziness All other systems reviewed and are otherwise negative except as noted above.    Blood pressure 112/60, pulse 93, height 5\' 10"  (1.778 m), weight 285 lb 3.2 oz (129.366 kg).  General appearance: alert and no distress Neck: no adenopathy, no carotid bruit, no JVD, supple, symmetrical, trachea midline and thyroid not enlarged, symmetric, no tenderness/mass/nodules Lungs: clear to auscultation bilaterally Heart: regular rate and rhythm, S1, S2 normal, no murmur, click, rub or gallop Extremities: extremities normal, atraumatic, no cyanosis or edema  EKG atrial fibrillation with a ventricular response of 93  ASSESSMENT AND PLAN:   Atrial fibrillation Patient was sent to me for new onset atrial fibrillation. He is symptomatic from this. I began him on Xarelto by mouth oral anticoagulation and he underwent outpatient her current  cardioversion by Dr. Royann Shivers  on 1124 successfully to sinus rhythm with 1 shock (120 J). He currently is back in atrial fibrillation with controlled ventricular response symptomatic from this. A 2-D echo revealed normal LV systolic function with moderate to  Severe left atrial lodgment. A Myoview stress test showed inferior scar without ischemia.I'm going to  refer him to Dr. Fayrene Fearing and for consideration of A. Fib ablation. He may need to be put on an oral intake arrhythmic medication prior to that. He also may require cardiac catheterization to further define his coronary anatomy given the fact of the eschar on his Myoview stress test suggesting a silent old in for myocardial infarction. This could also be a false positive and artifact.       Runell Gess MD FACP,FACC,FAHA, [facility] 06/30/2013 12:28 PM

## 2013-07-20 ENCOUNTER — Ambulatory Visit (INDEPENDENT_AMBULATORY_CARE_PROVIDER_SITE_OTHER): Payer: BC Managed Care – PPO | Admitting: Internal Medicine

## 2013-07-20 ENCOUNTER — Encounter: Payer: Self-pay | Admitting: Internal Medicine

## 2013-07-20 ENCOUNTER — Encounter (INDEPENDENT_AMBULATORY_CARE_PROVIDER_SITE_OTHER): Payer: Self-pay

## 2013-07-20 VITALS — BP 152/90 | HR 74 | Ht 70.0 in | Wt 286.4 lb

## 2013-07-20 DIAGNOSIS — I517 Cardiomegaly: Secondary | ICD-10-CM

## 2013-07-20 DIAGNOSIS — G4733 Obstructive sleep apnea (adult) (pediatric): Secondary | ICD-10-CM

## 2013-07-20 DIAGNOSIS — I4891 Unspecified atrial fibrillation: Secondary | ICD-10-CM

## 2013-07-20 DIAGNOSIS — I119 Hypertensive heart disease without heart failure: Secondary | ICD-10-CM | POA: Insufficient documentation

## 2013-07-20 DIAGNOSIS — I10 Essential (primary) hypertension: Secondary | ICD-10-CM

## 2013-07-20 NOTE — Progress Notes (Signed)
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 male with a h/o persistent atrial fibrillaiton, OSA (uses CPAP), morbid obestiy, and LA enlargement who presents for EP consultation.  He reports that he was initially diagnosed with atrial fibrillation 3 months ago after having difficulty hiking at high elevation.  The following week he presented for a routine physical exam and was diagnosed with atrial fibrillation.  He is uncertain as to when his afib began.  He has been mostly asymptomatic without symptoms of palpitations, SOB at rest, or chest pain.  He finds that with moderate exertion that he does have SOB.  He describes his energy as preserved.  He feels that his exercise tolerance and fatigue improved with rate control with metoprolol.  He recently underwent cardioversion but quickly returned to afib.  He has not tried AAD therapy.  Today, he denies symptoms of lower extremity edema, presyncope, syncope, or neurologic sequela. The patient is tolerating medications without difficulties and is otherwise without complaint today.   Past Medical History  Diagnosis Date  . PONV (postoperative nausea and vomiting)     slow to wake up after  . Hypertension   . Rotator cuff tear, right dec 2012    physical therapy done, decreased strength  . OSA (obstructive sleep apnea)     does not know settings, last sleep study 5 yrs ago  . Persistent atrial fibrillation     cardioversion 06/06/13  . Obesity    Past Surgical History  Procedure Laterality Date  . Left total hip replacement  2010  . Right knee replacment  2006 or 2007  . Arthroscopy right knee  15 yrs ago  . Rotator cuff repair      right and left done yrs ago  . Total hip revision Left 10/08/2011  . Total hip revision  04/09/2012    Procedure: TOTAL HIP REVISION;  Surgeon: Gearlean Alf, MD;  Location: [facility];  Service: Orthopedics;  Laterality: Left;  Left Hip Acetabular  Revision vs Constrained Liner  . Cardioversion N/A 06/06/2013    Procedure: CARDIOVERSION;  Surgeon: Sanda Klein, MD;  Location: [facility];  Service: Cardiovascular;  Laterality: N/A;    Current Outpatient Prescriptions  Medication Sig Dispense Refill  . aspirin 81 MG tablet Take 81 mg by mouth daily.      . hydrochlorothiazide (MICROZIDE) 12.5 MG capsule Take 12.5 mg by mouth every morning.      Marland Kitchen lisinopril (PRINIVIL,ZESTRIL) 40 MG tablet Take 40 mg by mouth every morning.      . metoprolol succinate (TOPROL-XL) 50 MG 24 hr tablet Take 1 tablet (50 mg total) by mouth daily.  30 tablet  6  . Rivaroxaban (XARELTO) 20 MG TABS tablet Take 1 tablet (20 mg total) by mouth daily.  30 tablet  6   No current facility-administered medications for this visit.    Allergies  Allergen Reactions  . Oxycodone Rash    History   Social History  . Marital Status: Married    Spouse Name: N/A    Number of Children: N/A  . Years of Education: N/A   Occupational History  . Not on file.   Social History Main Topics  . Smoking status: Never Smoker   . Smokeless tobacco: Never Used  . Alcohol Use: No  . Drug Use: No  . Sexual Activity:    Other Topics Concern  . Not on file   Social  History Narrative   Pt lives in [place] with spouse.  Leadership training and behavioral safety training.    Family History  Problem Relation Age of Onset  . Other      mother had a pacemaker    ROS- All systems are reviewed and negative except as per the HPI above  Physical Exam: Filed Vitals:   07/20/13 1351  BP: 152/90  Pulse: 74  Height: 5\' 10"  (1.778 m)  Weight: 286 lb 6.4 oz (129.91 kg)    GEN- The patient is morbidly obese appearing, alert and oriented x 3 today.   Head- normocephalic, atraumatic Eyes-  Sclera clear, conjunctiva pink Ears- hearing intact Oropharynx- clear Neck- supple,  Lungs- Clear to ausculation bilaterally, normal work of breathing Heart- irregular rate and  rhythm, no murmurs, rubs or gallops, PMI not laterally displaced GI- soft, NT, ND, + BS Extremities- no clubbing, cyanosis, or edema MS- no significant deformity or atrophy Skin- no rash or lesion Psych- euthymic mood, full affect Neuro- strength and sensation are intact  EKG today reveals afib, V rate 74 bpm, Qtc 439 Echo is reviewed, LA size 1mm Dr Rachel Bo notes and epic records are reviewed  Assessment and Plan:  1. Persistent afib The patient has some symptoms with his afib but really cannot tell when his afib began.  He has not tried AAD therapy but has had some clinically improvement with metoprolol.  He is appropriately anticoagulated with xarelto. His chads2vasc score is 1. Therapeutic strategies for afib including medicine and ablation were discussed in detail with the patient today.  Per HRS guidelines for persistent afib, he is not a candidate for ablation as he has not failed AAD therapy.  His anticipated success rates with ablation are reduced (60%) and would likely require multiple procedures.  I would favor tikosyn as our AAD option. He has difficulty with trigger finger/ carpal tunnel and would like to proceed with surgery.  I have advised that he proceed with this surgery.  3-4 weeks post operatively, he could be admitted for tikosyn (after back on his anticoagulation for 3 weeks).  I will have him see Elberta Leatherwood in our pharmacy clinic to coordinate [facility] admission.  He will need to come off of hctz prior to admit for tikosyn.  2. HTN Salt restriction and weight loss are advised Will need to come off of hctz just prior to initiation of tikosyn We can further manage his BP while in the hospital.  3. Severe LA enlargement Noted, suggests that his afib is likely longstanding and will be very difficult to manage with rhythm control.  Ultimately he may require a rate control strategy  4. OSA Compliance with CPAP is advised.  Return to see me in 3 months (after initiation  of tikosyn)

## 2013-07-20 NOTE — Patient Instructions (Signed)
Your physician recommends that you schedule a follow-up appointment in: 3 weeks with Elberta Leatherwood to discuss Tikosyn load and 3 months with Dr Rayann Heman  Will need to hold Xarelto 24 hours before and after hand surgery.

## 2013-08-04 ENCOUNTER — Encounter (HOSPITAL_BASED_OUTPATIENT_CLINIC_OR_DEPARTMENT_OTHER): Payer: Self-pay | Admitting: *Deleted

## 2013-08-04 NOTE — Progress Notes (Signed)
Pt has been in [place] on business-saw dr allred preop-ekg done 1/15-will need istat-to bring cpap and use post op On xaralto-to stop 24hr preop pre cardiology

## 2013-08-05 ENCOUNTER — Encounter (HOSPITAL_BASED_OUTPATIENT_CLINIC_OR_DEPARTMENT_OTHER): Payer: Self-pay | Admitting: *Deleted

## 2013-08-08 ENCOUNTER — Encounter (HOSPITAL_BASED_OUTPATIENT_CLINIC_OR_DEPARTMENT_OTHER)
Admission: RE | Disposition: A | Discharge status: Home / Self Care | Payer: Self-pay | Source: Ambulatory Visit | Attending: Orthopedic Surgery

## 2013-08-08 ENCOUNTER — Encounter (HOSPITAL_BASED_OUTPATIENT_CLINIC_OR_DEPARTMENT_OTHER): Payer: BC Managed Care – PPO | Admitting: Anesthesiology

## 2013-08-08 ENCOUNTER — Ambulatory Visit (HOSPITAL_BASED_OUTPATIENT_CLINIC_OR_DEPARTMENT_OTHER): Payer: BC Managed Care – PPO | Admitting: Anesthesiology

## 2013-08-08 ENCOUNTER — Encounter (HOSPITAL_BASED_OUTPATIENT_CLINIC_OR_DEPARTMENT_OTHER): Payer: Self-pay | Admitting: Orthopedic Surgery

## 2013-08-08 ENCOUNTER — Ambulatory Visit (HOSPITAL_BASED_OUTPATIENT_CLINIC_OR_DEPARTMENT_OTHER)
Admission: RE | Admit: 2013-08-08 | Discharge: 2013-08-08 | Disposition: A | Discharge status: Home / Self Care | Payer: BC Managed Care – PPO | Source: Ambulatory Visit | Attending: Orthopedic Surgery | Admitting: Orthopedic Surgery

## 2013-08-08 DIAGNOSIS — Z96649 Presence of unspecified artificial hip joint: Secondary | ICD-10-CM | POA: Insufficient documentation

## 2013-08-08 DIAGNOSIS — G4733 Obstructive sleep apnea (adult) (pediatric): Secondary | ICD-10-CM | POA: Insufficient documentation

## 2013-08-08 DIAGNOSIS — Z885 Allergy status to narcotic agent status: Secondary | ICD-10-CM | POA: Insufficient documentation

## 2013-08-08 DIAGNOSIS — I10 Essential (primary) hypertension: Secondary | ICD-10-CM | POA: Insufficient documentation

## 2013-08-08 DIAGNOSIS — E669 Obesity, unspecified: Secondary | ICD-10-CM | POA: Insufficient documentation

## 2013-08-08 DIAGNOSIS — M65839 Other synovitis and tenosynovitis, unspecified forearm: Secondary | ICD-10-CM | POA: Insufficient documentation

## 2013-08-08 DIAGNOSIS — I4891 Unspecified atrial fibrillation: Secondary | ICD-10-CM | POA: Insufficient documentation

## 2013-08-08 DIAGNOSIS — M65849 Other synovitis and tenosynovitis, unspecified hand: Secondary | ICD-10-CM

## 2013-08-08 DIAGNOSIS — I517 Cardiomegaly: Secondary | ICD-10-CM | POA: Insufficient documentation

## 2013-08-08 DIAGNOSIS — G5600 Carpal tunnel syndrome, unspecified upper limb: Secondary | ICD-10-CM | POA: Insufficient documentation

## 2013-08-08 HISTORY — PX: CARPAL TUNNEL RELEASE: SHX101

## 2013-08-08 HISTORY — PX: TRIGGER FINGER RELEASE: SHX641

## 2013-08-08 HISTORY — DX: Presence of spectacles and contact lenses: Z97.3

## 2013-08-08 LAB — POCT I-STAT, CHEM 8
BUN: 20 mg/dL (ref 6–23)
CALCIUM ION: 1.2 mmol/L (ref 1.13–1.30)
CREATININE: 1 mg/dL (ref 0.50–1.35)
Chloride: 101 mEq/L (ref 96–112)
GLUCOSE: 93 mg/dL (ref 70–99)
HEMATOCRIT: 45 % (ref 39.0–52.0)
HEMOGLOBIN: 15.3 g/dL (ref 13.0–17.0)
Potassium: 3.6 mEq/L — ABNORMAL LOW (ref 3.7–5.3)
Sodium: 141 mEq/L (ref 137–147)
TCO2: 28 mmol/L (ref 0–100)

## 2013-08-08 SURGERY — CARPAL TUNNEL RELEASE
Anesthesia: Monitor Anesthesia Care | Site: Thumb | Laterality: Right

## 2013-08-08 MED ORDER — FENTANYL CITRATE 0.05 MG/ML IJ SOLN
INTRAMUSCULAR | Status: DC | PRN
  Administered 2013-08-08: 25 ug via INTRAVENOUS
  Administered 2013-08-08: 50 ug via INTRAVENOUS

## 2013-08-08 MED ORDER — FENTANYL CITRATE 0.05 MG/ML IJ SOLN: 50.0000 ug | INTRAMUSCULAR | Status: DC | PRN

## 2013-08-08 MED ORDER — OXYCODONE HCL 5 MG/5ML PO SOLN: 5.0000 mg | Freq: Once | ORAL | Status: DC | PRN

## 2013-08-08 MED ORDER — LIDOCAINE HCL (CARDIAC) 20 MG/ML IV SOLN
INTRAVENOUS | Status: DC | PRN
  Administered 2013-08-08: 50 mg via INTRAVENOUS

## 2013-08-08 MED ORDER — BUPIVACAINE HCL (PF) 0.25 % IJ SOLN
INTRAMUSCULAR | Status: AC
  Filled 2013-08-08: qty 30

## 2013-08-08 MED ORDER — CEFAZOLIN SODIUM-DEXTROSE 2-3 GM-% IV SOLR
INTRAVENOUS | Status: AC
  Filled 2013-08-08: qty 100

## 2013-08-08 MED ORDER — ONDANSETRON HCL 4 MG/2ML IJ SOLN
INTRAMUSCULAR | Status: DC | PRN
  Administered 2013-08-08: 4 mg via INTRAVENOUS

## 2013-08-08 MED ORDER — HYDROCODONE-ACETAMINOPHEN 5-325 MG PO TABS: 1.0000 | ORAL_TABLET | Freq: Four times a day (QID) | ORAL | Status: DC | PRN

## 2013-08-08 MED ORDER — ONDANSETRON HCL 4 MG/2ML IJ SOLN: 4.0000 mg | Freq: Once | INTRAMUSCULAR | Status: DC | PRN

## 2013-08-08 MED ORDER — MIDAZOLAM HCL 2 MG/2ML IJ SOLN: 1.0000 mg | INTRAMUSCULAR | Status: DC | PRN

## 2013-08-08 MED ORDER — MIDAZOLAM HCL 5 MG/5ML IJ SOLN
INTRAMUSCULAR | Status: DC | PRN
  Administered 2013-08-08: 1 mg via INTRAVENOUS

## 2013-08-08 MED ORDER — FENTANYL CITRATE 0.05 MG/ML IJ SOLN
INTRAMUSCULAR | Status: AC
  Filled 2013-08-08: qty 2

## 2013-08-08 MED ORDER — LACTATED RINGERS IV SOLN
INTRAVENOUS | Status: DC | PRN
  Administered 2013-08-08: 14:00:00 via INTRAVENOUS

## 2013-08-08 MED ORDER — BUPIVACAINE HCL (PF) 0.25 % IJ SOLN
INTRAMUSCULAR | Status: DC | PRN
Start: 2013-08-08 — End: 2013-08-08
  Administered 2013-08-08: 7.5 mL

## 2013-08-08 MED ORDER — PROPOFOL 10 MG/ML IV EMUL
INTRAVENOUS | Status: AC
  Filled 2013-08-08: qty 50

## 2013-08-08 MED ORDER — PROPOFOL INFUSION 10 MG/ML OPTIME
INTRAVENOUS | Status: DC | PRN
  Administered 2013-08-08: 120 ug/kg/min via INTRAVENOUS

## 2013-08-08 MED ORDER — MIDAZOLAM HCL 2 MG/2ML IJ SOLN
INTRAMUSCULAR | Status: AC
  Filled 2013-08-08: qty 2

## 2013-08-08 MED ORDER — HYDROMORPHONE HCL PF 1 MG/ML IJ SOLN: 0.2500 mg | INTRAMUSCULAR | Status: DC | PRN

## 2013-08-08 MED ORDER — OXYCODONE HCL 5 MG PO TABS: 5.0000 mg | ORAL_TABLET | Freq: Once | ORAL | Status: DC | PRN

## 2013-08-08 MED ORDER — LACTATED RINGERS IV SOLN
INTRAVENOUS | Status: DC
Start: 2013-08-08 — End: 2013-08-08
  Administered 2013-08-08: 13:00:00 via INTRAVENOUS

## 2013-08-08 MED ORDER — DEXTROSE 5 % IV SOLN
3.0000 g | INTRAVENOUS | Status: DC | PRN
  Administered 2013-08-08: 3 g via INTRAVENOUS

## 2013-08-08 SURGICAL SUPPLY — 39 items
BANDAGE COBAN STERILE 2 (GAUZE/BANDAGES/DRESSINGS) ×3 IMPLANT
BLADE SURG 15 STRL LF DISP TIS (BLADE) ×2 IMPLANT
BLADE SURG 15 STRL SS (BLADE) ×1
BNDG COHESIVE 3X5 TAN STRL LF (GAUZE/BANDAGES/DRESSINGS) ×3 IMPLANT
BNDG ESMARK 4X9 LF (GAUZE/BANDAGES/DRESSINGS) IMPLANT
BNDG GAUZE ELAST 4 BULKY (GAUZE/BANDAGES/DRESSINGS) ×3 IMPLANT
CHLORAPREP W/TINT 26ML (MISCELLANEOUS) ×3 IMPLANT
CORDS BIPOLAR (ELECTRODE) ×3 IMPLANT
COVER MAYO STAND STRL (DRAPES) ×3 IMPLANT
COVER TABLE BACK 60X90 (DRAPES) ×3 IMPLANT
CUFF TOURNIQUET SINGLE 18IN (TOURNIQUET CUFF) ×3 IMPLANT
DECANTER SPIKE VIAL GLASS SM (MISCELLANEOUS) IMPLANT
DRAPE EXTREMITY T 121X128X90 (DRAPE) ×3 IMPLANT
DRAPE SURG 17X23 STRL (DRAPES) ×3 IMPLANT
DRSG KUZMA FLUFF (GAUZE/BANDAGES/DRESSINGS) IMPLANT
GAUZE XEROFORM 1X8 LF (GAUZE/BANDAGES/DRESSINGS) ×3 IMPLANT
GLOVE BIOGEL PI IND STRL 8.5 (GLOVE) ×2 IMPLANT
GLOVE BIOGEL PI INDICATOR 8.5 (GLOVE) ×1
GLOVE SURG ORTHO 8.0 STRL STRW (GLOVE) ×3 IMPLANT
GLOVE SURG SS PI 7.0 STRL IVOR (GLOVE) ×3 IMPLANT
GOWN STRL REUS W/ TWL LRG LVL3 (GOWN DISPOSABLE) ×2 IMPLANT
GOWN STRL REUS W/TWL LRG LVL3 (GOWN DISPOSABLE) ×1
GOWN STRL REUS W/TWL XL LVL3 (GOWN DISPOSABLE) ×3 IMPLANT
NEEDLE 27GAX1X1/2 (NEEDLE) ×3 IMPLANT
NS IRRIG 1000ML POUR BTL (IV SOLUTION) ×3 IMPLANT
PACK BASIN DAY SURGERY FS (CUSTOM PROCEDURE TRAY) ×3 IMPLANT
PAD ABD 8X10 STRL (GAUZE/BANDAGES/DRESSINGS) ×3 IMPLANT
PAD CAST 3X4 CTTN HI CHSV (CAST SUPPLIES) ×2 IMPLANT
PADDING CAST ABS 4INX4YD NS (CAST SUPPLIES) ×1
PADDING CAST ABS COTTON 4X4 ST (CAST SUPPLIES) ×2 IMPLANT
PADDING CAST COTTON 3X4 STRL (CAST SUPPLIES) ×1
SPONGE GAUZE 4X4 12PLY (GAUZE/BANDAGES/DRESSINGS) ×3 IMPLANT
STOCKINETTE 4X48 STRL (DRAPES) ×3 IMPLANT
SUT VICRYL 4-0 PS2 18IN ABS (SUTURE) IMPLANT
SUT VICRYL RAPIDE 4/0 PS 2 (SUTURE) ×3 IMPLANT
SYR BULB 3OZ (MISCELLANEOUS) ×3 IMPLANT
SYR CONTROL 10ML LL (SYRINGE) ×3 IMPLANT
TOWEL OR 17X24 6PK STRL BLUE (TOWEL DISPOSABLE) ×6 IMPLANT
UNDERPAD 30X30 INCONTINENT (UNDERPADS AND DIAPERS) ×3 IMPLANT

## 2013-08-08 NOTE — Brief Op Note (Signed)
08/08/2013  2:29 PM  PATIENT:  Birder Robson  63 y.o. male  PRE-OPERATIVE DIAGNOSIS:  RIGHT CARPAL TUNNEL SYNDROME, RIGHT THUMB STENOSING TENOSYNOVITIS  POST-OPERATIVE DIAGNOSIS:  RIGHT CARPAL TUNNEL SYNDROME, RIGHT THUMB STENOSING TENOSYNOVITIS  PROCEDURE:  Procedure(s): RIGHT CARPAL TUNNEL RELEASE (Right) RELEASE STENOSING TENOSYNOVITIS RIGHT THUMB (Right)  SURGEON:  Surgeon(s) and Role:    * Wynonia Sours, MD - Primary  PHYSICIAN ASSISTANT:   ASSISTANTS: none   ANESTHESIA:   local and regional  EBL:     BLOOD ADMINISTERED:none  DRAINS: none   LOCAL MEDICATIONS USED:  MARCAINE     SPECIMEN:  No Specimen  DISPOSITION OF SPECIMEN:  N/A  COUNTS:  YES  TOURNIQUET:   Total Tourniquet Time Documented: Forearm (Right) - 27 minutes Total: Forearm (Right) - 27 minutes   DICTATION: .Other Dictation: Dictation Number [id number]  PLAN OF CARE: Discharge to home after PACU  PATIENT DISPOSITION:  PACU - hemodynamically stable.

## 2013-08-08 NOTE — Transfer of Care (Signed)
Immediate Anesthesia Transfer of Care Note  Patient: [name]  Procedure(s) Performed: Procedure(s): RIGHT CARPAL TUNNEL RELEASE (Right) RELEASE STENOSING TENOSYNOVITIS RIGHT THUMB (Right)  Patient Location: PACU  Anesthesia Type:Bier block  Level of Consciousness: awake, alert , oriented and patient cooperative  Airway & Oxygen Therapy: Patient Spontanous Breathing and Patient connected to face mask oxygen  Post-op Assessment: Report given to PACU RN and Post -op Vital signs reviewed and stable  Post vital signs: Reviewed and stable  Complications: No apparent anesthesia complications

## 2013-08-08 NOTE — Op Note (Signed)
Dictation Number [id number]

## 2013-08-08 NOTE — Anesthesia Preprocedure Evaluation (Addendum)
Anesthesia Evaluation  Patient identified by MRN, date of birth, ID band Patient awake    Reviewed: Allergy & Precautions, H&P , NPO status , Patient's Chart, lab work & pertinent test results  History of Anesthesia Complications (+) PONV  Airway Mallampati: I TM Distance: >3 FB Neck ROM: Full    Dental  (+) Teeth Intact and Dental Advisory Given   Pulmonary  breath sounds clear to auscultation        Cardiovascular hypertension, Pt. on medications and Pt. on home beta blockers + dysrhythmias Atrial Fibrillation Rhythm:Regular Rate:Normal     Neuro/Psych    GI/Hepatic   Endo/Other  Morbid obesity  Renal/GU      Musculoskeletal   Abdominal   Peds  Hematology   Anesthesia Other Findings   Reproductive/Obstetrics                          Anesthesia Physical Anesthesia Plan  ASA: III  Anesthesia Plan: Bier Block and MAC   Post-op Pain Management:    Induction: Intravenous  Airway Management Planned: Simple Face Mask  Additional Equipment:   Intra-op Plan:   Post-operative Plan:   Informed Consent: I have reviewed the patients History and Physical, chart, labs and discussed the procedure including the risks, benefits and alternatives for the proposed anesthesia with the patient or authorized representative who has indicated his/her understanding and acceptance.   Dental advisory given  Plan Discussed with: CRNA and Anesthesiologist  Anesthesia Plan Comments:         Anesthesia Quick Evaluation

## 2013-08-08 NOTE — Discharge Instructions (Addendum)

## 2013-08-08 NOTE — H&P (Signed)
[name] is a 63 year-old right-hand dominant former patient who comes in today referred by Dr. Delfina Redwood for consultation with respect to numbness and tingling in his right hand.  He had positive nerve conductions done six years ago. He has had a carpal tunnel release on his left side. Since then he has had three hip operations. He fell last week suffering an injury to his right shoulder. He has had this operated on in the past and is scheduled to see Dr. Tamera Punt.  He is complaining of being awakened 2-3 out of 7 nights, he has no new history of injury. He has been wearing  a brace, he has not taken anything for this.  He has no history of diabetes, thyroid problems, arthritis or gout.  He complains of moderate aching pain with a feeling of numbness in the median nerve distribution. He has had his nerve conductions repeated and this reveals improvement in his left side, but his right side is now 6.1 and his motor component 3.5 and sensory component with an amplitude diminution to 16. He has developed triggering of his thumb.  ALLERGIES:    None.    MEDICATIONS:     Lisinopril.  SURGICAL HISTORY:    Hip replacements.  FAMILY MEDICAL HISTORY:     Positive for high blood pressure.  SOCIAL HISTORY:     He does not smoke or drink, he is self employed.    REVIEW OF SYSTEMS:   Negative 14 points.  [name] is an 63 y.o. male.   Chief Complaint: cts rt sts rt thumb HPI: see above  Past Medical History  Diagnosis Date  . PONV (postoperative nausea and vomiting)     slow to wake up after  . Hypertension   . Rotator cuff tear, right dec 2012    physical therapy done, decreased strength  . Persistent atrial fibrillation     cardioversion 06/06/13  . Obesity   . Atrial enlargement, left   . Hearing aid worn     both ears  . Wears glasses   . OSA (obstructive sleep apnea)     does not know settings, last sleep study 5 yrs ago    Past Surgical History  Procedure Laterality Date  . Left  total hip replacement  2010  . Right knee replacment  2006   . Arthroscopy right knee  15 yrs ago  . Rotator cuff repair      right and left done yrs ago  . Total hip revision Left 10/08/2011  . Total hip revision  04/09/2012    Procedure: TOTAL HIP REVISION;  Surgeon: Gearlean Alf, MD;  Location: [facility];  Service: Orthopedics;  Laterality: Left;  Left Hip Acetabular Revision vs Constrained Liner  . Cardioversion N/A 06/06/2013    Procedure: CARDIOVERSION;  Surgeon: Sanda Klein, MD;  Location: [facility];  Service: Cardiovascular;  Laterality: N/A;  . Tonsillectomy      Family History  Problem Relation Age of Onset  . Other      mother had a pacemaker   Social History:  reports that he has never smoked. He has never used smokeless tobacco. He reports that he does not drink alcohol or use illicit drugs.  Allergies:  Allergies  Allergen Reactions  . Oxycodone Rash    No prescriptions prior to admission    No results found for this or any previous visit (from the past 48 hour(s)).  No results found.   Pertinent items are  noted in HPI.  Height 5\' 10"  (1.778 m), weight 280 lb (127.007 kg).  General appearance: alert, cooperative and appears stated age Head: Normocephalic, without obvious abnormality Neck: no JVD Resp: clear to auscultation bilaterally Cardio: regular rate and rhythm, S1, S2 normal, no murmur, click, rub or gallop GI: soft, non-tender; bowel sounds normal; no masses,  no organomegaly Extremities: extremities normal, atraumatic, no cyanosis or edema Pulses: 2+ and symmetric Skin: Skin color, texture, turgor normal. No rashes or lesions Neurologic: Grossly normal Incision/Wound: na  Assessment/Plan RADIOGRAPHS:   X-rays of his hand are negative.   DIAGNOSIS:   STS rt thumb and carpal tunnel syndrome.  We have discussed with him the possible surgical release of the right carpal canal. The pre, peri and postoperative course were discussed along  with the risks and complications.  The patient is aware there is no guarantee with the surgery, possibility of infection, recurrence, injury to arteries, nerves, tendons, incomplete relief of symptoms and dystrophy.   He has had his left side done and has done well with this.      He scheduled for carpal tunnel release right hand and A-1 pulley rt thumb as an outpatient under regional anesthesia.  Desira Alessandrini R 08/08/2013, 11:08 AM

## 2013-08-08 NOTE — Anesthesia Postprocedure Evaluation (Signed)
Anesthesia Post Note  Patient: [name]  Procedure(s) Performed: Procedure(s) (LRB): RIGHT CARPAL TUNNEL RELEASE (Right) RELEASE STENOSING TENOSYNOVITIS RIGHT THUMB (Right)  Anesthesia type: MAC  Patient location: PACU  Post pain: Pain level controlled and Adequate analgesia  Post assessment: Post-op Vital signs reviewed, Patient's Cardiovascular Status Stable and Respiratory Function Stable  Last Vitals:  Filed Vitals:   08/08/13 1457  BP: 150/90  Pulse: 81  Temp:   Resp: 16    Post vital signs: Reviewed and stable  Level of consciousness: awake, alert  and oriented  Complications: No apparent anesthesia complications

## 2013-08-09 NOTE — Op Note (Signed)
[name], [name]               ACCOUNT NO.:  [id number]  MEDICAL RECORD NO.:  [id number]  LOCATION:                                 FACILITY:  PHYSICIAN:  Daryll Brod, M.D.       DATE OF BIRTH:  Nov 29, 1950  DATE OF PROCEDURE:  08/08/2013 DATE OF DISCHARGE:                              OPERATIVE REPORT   PREOPERATIVE DIAGNOSES:  Carpal tunnel syndrome, stenosing tenosynovitis, right thumb, right wrist.  POSTOPERATIVE DIAGNOSES:  Carpal tunnel syndrome, stenosing tenosynovitis, right thumb, right wrist.  OPERATION:  Release of A1 pulley, right thumb.  Carpal tunnel release, right hand.  SURGEON:  Daryll Brod, MD  ANESTHESIA:  Forearm-based IV regional with local infiltration.  ANESTHESIOLOGIST:  Crews.  HISTORY:  The patient is a 63 year old male with a history of carpal tunnel syndrome, nerve conductions positive, stenosing tenosynovitis, right thumb.  Not responsive to conservative treatment.  He has elected to undergo surgical decompression.  Pre, peri, and postoperative course have been discussed along with risks and complications.  He is aware that there is no guarantee with the surgery, possibility of infection; recurrence of injury to arteries, nerves, tendons; incomplete relief of symptoms, dystrophy.  In preoperative area, the patient is seen, the extremity marked by both patient and surgeon.  Antibiotic given.  PROCEDURE IN DETAIL:  The patient was brought to the operating room, where a forearm-based IV regional anesthetic was carried out without difficulty.  He was prepped using ChloraPrep, supine position, right arm free.  A 3-minute dry time was allowed.  Time-out taken, confirming the patient and procedure.  A transverse incision was made over the A1 pulley of the right thumb, carried down through subcutaneous tissue. Bleeders were electrocauterized with bipolar.  The neurovascular structures identified and protected.  Retractors placed, locked.  FPL tendon  was identified.  The A1 pulley was released on its radial aspect taking care to protect the oblique pulley.  Finger came fully straight. The wound was irrigated.  The skin was closed with interrupted 4-0 Vicryl Rapide sutures.  Separate incision was then made longitudinally over the right palm, carried down through subcutaneous tissue.  Bleeders again electrocauterized.  Palmar fascia was split.  Superficial palmar arch identified.  Flexor tendon to the ring and little fingers identified to the ulnar side of the median nerve.  The carpal retinaculum was incised with sharp dissection.  Right angle and Sewell retractor were placed between skin and forearm fascia.  Fascia was released for approximately a centimeter and half to 2 cm under direct vision.  Canal was explored.  Area of the compression to the nerve was apparent.  Motor branch entered the muscle.  No further lesions were identified.  The wound was irrigated.  The skin then closed with interrupted 4-0 Vicryl Rapide sutures.  Local infiltration 0.25% Marcaine without epinephrine was given to each wound.  Total of 9 mL was used.  Sterile compressive dressing with a thumb free was applied.  On deflation of the tourniquet, all fingers immediately pinked.  He was taken to the recovery room for observation in satisfactory condition.  He will be discharged home to return in 1 week on hydrocodone.  ______________________________ Daryll Brod, M.D.     GK/MEDQ  D:  08/08/2013  T:  08/09/2013  Job:  [id number]

## 2013-08-10 ENCOUNTER — Encounter (HOSPITAL_BASED_OUTPATIENT_CLINIC_OR_DEPARTMENT_OTHER): Payer: Self-pay | Admitting: Orthopedic Surgery

## 2013-08-10 ENCOUNTER — Institutional Professional Consult (permissible substitution): Payer: PRIVATE HEALTH INSURANCE | Admitting: Internal Medicine

## 2013-08-17 ENCOUNTER — Ambulatory Visit: Payer: BC Managed Care – PPO | Admitting: Pharmacist

## 2013-09-12 ENCOUNTER — Telehealth: Payer: Self-pay | Admitting: Internal Medicine

## 2013-09-12 NOTE — Telephone Encounter (Signed)
New message      Returning [name]'s call to schedule a tikosyn "procedure".

## 2013-09-12 NOTE — Telephone Encounter (Signed)
Spoke with pt.  He has thought about Tikosyn more and would like to proceed with admission at this time.  He will be unavailable for the next week.  Plan to admit on 3/16.  He will stop his HCTZ on 3/13.

## 2013-09-26 ENCOUNTER — Ambulatory Visit (INDEPENDENT_AMBULATORY_CARE_PROVIDER_SITE_OTHER): Payer: BC Managed Care – PPO | Admitting: Pharmacist

## 2013-09-26 ENCOUNTER — Encounter (HOSPITAL_COMMUNITY): Payer: Self-pay | Admitting: General Practice

## 2013-09-26 ENCOUNTER — Inpatient Hospital Stay (HOSPITAL_COMMUNITY)
Admission: AD | Admit: 2013-09-26 | Discharge: 2013-09-29 | DRG: 310 | Disposition: A | Discharge status: Home / Self Care | Payer: BC Managed Care – PPO | Source: Ambulatory Visit | Attending: Internal Medicine | Admitting: Internal Medicine

## 2013-09-26 DIAGNOSIS — I440 Atrioventricular block, first degree: Secondary | ICD-10-CM | POA: Diagnosis present

## 2013-09-26 DIAGNOSIS — T8484XA Pain due to internal orthopedic prosthetic devices, implants and grafts, initial encounter: Secondary | ICD-10-CM

## 2013-09-26 DIAGNOSIS — I4891 Unspecified atrial fibrillation: Secondary | ICD-10-CM | POA: Diagnosis present

## 2013-09-26 DIAGNOSIS — G4733 Obstructive sleep apnea (adult) (pediatric): Secondary | ICD-10-CM | POA: Diagnosis present

## 2013-09-26 DIAGNOSIS — Z96659 Presence of unspecified artificial knee joint: Secondary | ICD-10-CM

## 2013-09-26 DIAGNOSIS — Z96649 Presence of unspecified artificial hip joint: Secondary | ICD-10-CM

## 2013-09-26 DIAGNOSIS — E669 Obesity, unspecified: Secondary | ICD-10-CM | POA: Diagnosis present

## 2013-09-26 DIAGNOSIS — T84028A Dislocation of other internal joint prosthesis, initial encounter: Secondary | ICD-10-CM

## 2013-09-26 DIAGNOSIS — I517 Cardiomegaly: Secondary | ICD-10-CM

## 2013-09-26 DIAGNOSIS — I10 Essential (primary) hypertension: Secondary | ICD-10-CM

## 2013-09-26 HISTORY — DX: Obstructive sleep apnea (adult) (pediatric): G47.33

## 2013-09-26 HISTORY — DX: Obstructive sleep apnea (adult) (pediatric): Z99.89

## 2013-09-26 HISTORY — DX: Unspecified osteoarthritis, unspecified site: M19.90

## 2013-09-26 LAB — BASIC METABOLIC PANEL
BUN: 20 mg/dL (ref 6–23)
BUN: 23 mg/dL (ref 6–23)
CALCIUM: 9 mg/dL (ref 8.4–10.5)
CO2: 27 mEq/L (ref 19–32)
CO2: 30 mEq/L (ref 19–32)
CREATININE: 0.9 mg/dL (ref 0.4–1.5)
CREATININE: 0.97 mg/dL (ref 0.50–1.35)
Calcium: 9.5 mg/dL (ref 8.4–10.5)
Chloride: 104 mEq/L (ref 96–112)
Chloride: 99 mEq/L (ref 96–112)
GFR, EST NON AFRICAN AMERICAN: 88 mL/min — AB (ref >90)
GFR: 88.95 mL/min (ref >60.00)
Glucose, Bld: 69 mg/dL — ABNORMAL LOW (ref 70–99)
Glucose, Bld: 88 mg/dL (ref 70–99)
POTASSIUM: 4.1 meq/L (ref 3.7–5.3)
Potassium: 3.8 mEq/L (ref 3.5–5.1)
Sodium: 139 mEq/L (ref 135–145)
Sodium: 141 mEq/L (ref 137–147)

## 2013-09-26 LAB — MAGNESIUM: MAGNESIUM: 2 mg/dL (ref 1.5–2.5)

## 2013-09-26 MED ORDER — METOPROLOL SUCCINATE ER 50 MG PO TB24
50.0000 mg | ORAL_TABLET | Freq: Every day | ORAL | Status: DC
  Administered 2013-09-27 – 2013-09-29 (×3): 50 mg via ORAL
  Filled 2013-09-26 (×3): qty 1

## 2013-09-26 MED ORDER — SODIUM CHLORIDE 0.9 % IJ SOLN: 3.0000 mL | INTRAMUSCULAR | Status: DC | PRN

## 2013-09-26 MED ORDER — LISINOPRIL 40 MG PO TABS
40.0000 mg | ORAL_TABLET | ORAL | Status: DC
  Administered 2013-09-27 – 2013-09-29 (×3): 40 mg via ORAL
  Filled 2013-09-26 (×4): qty 1

## 2013-09-26 MED ORDER — SODIUM CHLORIDE 0.9 % IV SOLN
250.0000 mL | INTRAVENOUS | Status: DC | PRN
  Administered 2013-09-28: 11:00:00 via INTRAVENOUS

## 2013-09-26 MED ORDER — POTASSIUM CHLORIDE CRYS ER 20 MEQ PO TBCR
40.0000 meq | EXTENDED_RELEASE_TABLET | Freq: Once | ORAL | Status: AC
  Administered 2013-09-26: 40 meq via ORAL
  Filled 2013-09-26: qty 2

## 2013-09-26 MED ORDER — RIVAROXABAN 20 MG PO TABS
20.0000 mg | ORAL_TABLET | Freq: Every day | ORAL | Status: DC
  Administered 2013-09-27: 20 mg via ORAL
  Filled 2013-09-26: qty 1

## 2013-09-26 MED ORDER — SODIUM CHLORIDE 0.9 % IJ SOLN
3.0000 mL | Freq: Two times a day (BID) | INTRAMUSCULAR | Status: DC
  Administered 2013-09-26 – 2013-09-27 (×3): 3 mL via INTRAVENOUS

## 2013-09-26 MED ORDER — DOFETILIDE 500 MCG PO CAPS
500.0000 ug | ORAL_CAPSULE | Freq: Two times a day (BID) | ORAL | Status: DC
  Administered 2013-09-26: 500 ug via ORAL
  Filled 2013-09-26 (×3): qty 1

## 2013-09-26 NOTE — H&P (Signed)
ELECTROPHYSIOLOGY ADMISSION HISTORY & PHYSICAL   Patient ID: [name] MRN: [id number], DOB/AGE: 02/05/1951   Date of Admission: 09/26/2013  Primary Physician: Seward Carol, MD Primary Cardiologist: Thompson Grayer, MD Reason for Admission: Elective admission for AAD loading with Tikosyn  History of Present Illness [name] is a 63 year old gentleman with persistent atrial fibrillaiton, OSA and obesity who presents today for Tikosyn initiation. He was last seen by Dr. Rayann Heman in January. He reports that he was initially diagnosed with atrial fibrillation in the fall. He underwent cardioversion November 2014 but quickly returned to afib. He has not tried AAD therapy. Plan was to start Tikosyn in February 3-4 weeks after carpal tunnel surgery. Pt called to report he was feeling better so he did not proceed at that time. Since then, he has noticed an increase in symptoms associated with afib so would like to proceed with admission at this time. He has been seen in the office by Elberta Leatherwood, PharmD today. He took his last dose of HCTZ on 09/23/2013. He is currently not taking any other QTc prolongating or contraindicated medications. He reports compliance with Xarelto and has not missed any doses in the past 30 days. ECG reviewed today in the office by Dr. Rayann Heman - afib with V rate of 75 bpm, QTc 439 msec.   Past Medical History Past Medical History  Diagnosis Date  . PONV (postoperative nausea and vomiting)     slow to wake up after  . Hypertension   . Rotator cuff tear, right dec 2012    physical therapy done, decreased strength  . Persistent atrial fibrillation     cardioversion 06/06/13  . Obesity   . Atrial enlargement, left   . Hearing aid worn     both ears  . Wears glasses   . OSA (obstructive sleep apnea)     does not know settings, last sleep study 5 yrs ago    Past Surgical History Past Surgical History  Procedure Laterality Date  . Total hip arthroplasty Left 2010   . Total knee arthroplasty Right 2006   . Knee arthroscopy Right 1990's  . Shoulder open rotator cuff repair Left 1990's  . Total hip revision Left 10/08/2011  . Total hip revision  04/09/2012    Procedure: TOTAL HIP REVISION;  Surgeon: Gearlean Alf, MD;  Location: [facility];  Service: Orthopedics;  Laterality: Left;  Left Hip Acetabular Revision vs Constrained Liner  . Cardioversion N/A 06/06/2013    Procedure: CARDIOVERSION;  Surgeon: Sanda Klein, MD;  Location: [facility];  Service: Cardiovascular;  Laterality: N/A;  . Carpal tunnel release Right 08/08/2013    Procedure: RIGHT CARPAL TUNNEL RELEASE;  Surgeon: Wynonia Sours, MD;  Location: [place];  Service: Orthopedics;  Laterality: Right;  . Trigger finger release Right 08/08/2013    Procedure: RELEASE STENOSING TENOSYNOVITIS RIGHT THUMB;  Surgeon: Wynonia Sours, MD;  Location: [place];  Service: Orthopedics;  Laterality: Right;  . Tonsillectomy  1950's  . Shoulder open rotator cuff repair Right 11/2007  . Carpal tunnel release Left 2008    Allergies/Intolerances Allergies  Allergen Reactions  . Oxycodone Rash    Home Medications Medications Prior to Admission  Medication Sig Dispense Refill  . aspirin 81 MG tablet Take 81 mg by mouth daily.      . hydrochlorothiazide (MICROZIDE) 12.5 MG capsule Take 12.5 mg by mouth daily.      Marland Kitchen lisinopril (PRINIVIL,ZESTRIL) 40 MG  tablet Take 40 mg by mouth every morning.      . metoprolol succinate (TOPROL-XL) 50 MG 24 hr tablet Take 1 tablet (50 mg total) by mouth daily.  30 tablet  6  . Rivaroxaban (XARELTO) 20 MG TABS tablet Take 1 tablet (20 mg total) by mouth daily.  30 tablet  6    Family History Family History  Problem Relation Age of Onset  . Other      mother had a pacemaker    Social History History   Social History  . Marital Status: Married    Spouse Name: N/A    Number of Children: N/A  . Years of Education: N/A   Occupational  History  . Not on file.   Social History Main Topics  . Smoking status: Never Smoker   . Smokeless tobacco: Never Used  . Alcohol Use: No  . Drug Use: No  . Sexual Activity: Not on file   Other Topics Concern  . Not on file   Social History Narrative   Pt lives in [place] with spouse.  Leadership training and behavioral safety training.     Review of Systems General: No chills, fever, night sweats or weight changes.  Cardiovascular: No chest pain, dyspnea on exertion, edema, orthopnea, palpitations, paroxysmal nocturnal dyspnea. Dermatological: No rash, lesions or masses. Respiratory: No cough, dyspnea. Urologic: No hematuria, dysuria. Abdominal: No nausea, vomiting, diarrhea, bright red blood per rectum, melena, or hematemesis. Neurologic: No visual changes, weakness, changes in mental status. All other systems reviewed and are otherwise negative except as noted above.  Physical Exam Vitals: Blood pressure 142/95, pulse 72, temperature 98 F (36.7 C), temperature source Oral, resp. rate 20, SpO2 98.00%.  General: Well developed, well appearing 63 y.o. male in no acute distress. HEENT: Normocephalic, atraumatic. EOMs intact. Sclera nonicteric. Oropharynx clear.  Neck: Supple. No JVD. Lungs: Respirations regular and unlabored, CTA bilaterally. No wheezes, rales or rhonchi. Heart: Irregular. S1, S2 present. No murmurs, rub, S3 or S4. Abdomen: Soft, non-distended.  Extremities: No clubbing, cyanosis or edema. DP/PT/Radials 2+ and equal bilaterally. Psych: Normal affect. Neuro: Alert and oriented X 3. Moves all extremities spontaneously. Musculoskeletal: No kyphosis. Skin: Intact. Warm and dry. No rashes or petechiae in exposed areas.   Labs   Recent Labs Lab 09/26/13 1016  NA 139  K 3.8  CL 104  CO2 27  BUN 20  CREATININE 0.9  CALCIUM 9.0  GLUCOSE 69*  Magnesium 2.0  Radiology/Studies No results found.  Echocardiogram Oct 2014 Study Conclusions - Left  ventricle: The cavity size was normal. Wall thickness was increased in a pattern of moderate LVH. The estimated ejection fraction was 55%. Wall motion was normal; there were no regional wall motion abnormalities. The study does not allow definitive evaluation of LV diastolic function due to atrial fibrillation. Doppler parameters are consistent with elevated mean left atrial filling pressure. - Aortic valve: Mild regurgitation. - Mitral valve: Mild regurgitation. - Left atrium: The atrium was moderately to severely dilated. - Atrial septum: No defect or patent foramen ovale was identified. - Tricuspid valve: Mild regurgitation.  12-lead ECG reviewed today in the office by Dr. Rayann Heman - afib with V rate of 75 bpm, QTc 439 msec 12 lead ECG on arrival - afib with V rate 67 bpm, QTc 424 msec Telemetry reviewed - atrial fibrillation with V rate in 80s  Assessment and Plan Persistent atrial fibrillation [name] presents for elective AAD loading with Tikosyn. Potassium is slightly low at  3.8. His last dose of HCTZ was on 09/22/2013. Discussed options with [name]. He has work plans at the end of the week so would prefer to be admitted today and potassium supplemented prior to starting Tikosyn in the hospital rather than delay admission. Magnesium level is normal. QTc <440 and he is appropriately anticoagulated with Xarelto. Serum creatinine 0.9. CrCl >145mLmin. Once potassium is greater than 4, plan to start Tikosyn 500 micrograms every 12 hours per protocol.   Signed, Ileene Hutchinson, PA-C 09/26/2013, 3:14 PM  EP Attending  Patient seen and examined. Agree with above history, physical exam, assessment and plan. Ok for initiation of Tikosyn after potassium has been repleted. Anticipate cardioversion later in the week if the patient has not converted spontaneously.  Cristopher Peru, M.D.

## 2013-09-26 NOTE — Assessment & Plan Note (Signed)
Reviewed pt's labs.  K was slightly low at 3.8.  Discussed options with pt.  He has some work plans at the end of the week so he would prefer to be admitted today and supplement potassium prior to starting Tikosyn in the hospital rather than delay admission.  Mg okay at 2.0.  QTc <440 and pt appropriately anticoagulated with Xarelto.  SCr- 0.9.  CrCl> 139mLmin.  Would recommend starting at 51mcg BID once K >4.0.  Pt aware to report to hospital for admission.

## 2013-09-26 NOTE — Progress Notes (Signed)
HPI [name] is a 63 y.o. male with a h/o persistent atrial fibrillaiton, OSA (uses CPAP), morbid obestiy, and LA enlargement who presents for Tikosyn initiation.  He was last seen by Dr. Rayann Heman in January.  He reports that he was initially diagnosed with atrial fibrillation in the fall but is uncertain as to when his afib began.  He underwent cardioversion November 2014 but quickly returned to afib.  He has not tried AAD therapy.  Plan was to start Tikosyn in February 3-4 weeks after carpal tunnel surgery.  Pt called to report he was feeling better so he did not proceed at that time.  Since then, he has noticed an increase in symptoms associated with afib so would like to proceed with admission at this time.    Reviewed pt's medication list.  He took his last dose of HCTZ on 3/13.  He is currently not taking any other QTc prolongating or contraindicated medications.  He states he has been taking his Xarelto on a consistent basis and not missed any doses in the past 30 days.  He was not taking Xarelto with food so reinforced need to take this with some type of meal.   Reviewed potential side effects with pt including QTc prolongation.  He is aware of the importance of not missing any doses and to call the office if he misses more than 2 doses in a row.  He has printed a discount coupon to help with the copay.   EKG reviewed by Dr. Rayann Heman.  Afib with vent rate of 75 bpm.  QTC- 423msec.    Current Outpatient Prescriptions  Medication Sig Dispense Refill  . aspirin 81 MG tablet Take 81 mg by mouth daily.      Marland Kitchen lisinopril (PRINIVIL,ZESTRIL) 40 MG tablet Take 40 mg by mouth every morning.      . metoprolol succinate (TOPROL-XL) 50 MG 24 hr tablet Take 1 tablet (50 mg total) by mouth daily.  30 tablet  6  . Rivaroxaban (XARELTO) 20 MG TABS tablet Take 1 tablet (20 mg total) by mouth daily.  30 tablet  6   No current facility-administered medications for this visit.    Allergies  Allergen  Reactions  . Oxycodone Rash

## 2013-09-27 DIAGNOSIS — I4891 Unspecified atrial fibrillation: Principal | ICD-10-CM

## 2013-09-27 LAB — BASIC METABOLIC PANEL
BUN: 20 mg/dL (ref 6–23)
CHLORIDE: 104 meq/L (ref 96–112)
CO2: 28 mEq/L (ref 19–32)
CREATININE: 0.94 mg/dL (ref 0.50–1.35)
Calcium: 8.9 mg/dL (ref 8.4–10.5)
GFR, EST NON AFRICAN AMERICAN: 89 mL/min — AB (ref >90)
Glucose, Bld: 102 mg/dL — ABNORMAL HIGH (ref 70–99)
POTASSIUM: 4 meq/L (ref 3.7–5.3)
Sodium: 144 mEq/L (ref 137–147)

## 2013-09-27 LAB — MAGNESIUM: MAGNESIUM: 2.1 mg/dL (ref 1.5–2.5)

## 2013-09-27 MED ORDER — DOFETILIDE 500 MCG PO CAPS
500.0000 ug | ORAL_CAPSULE | Freq: Two times a day (BID) | ORAL | Status: DC
  Administered 2013-09-27 – 2013-09-29 (×5): 500 ug via ORAL
  Filled 2013-09-27 (×6): qty 1

## 2013-09-27 MED ORDER — RIVAROXABAN 20 MG PO TABS
20.0000 mg | ORAL_TABLET | Freq: Every day | ORAL | Status: DC
  Administered 2013-09-28 – 2013-09-29 (×2): 20 mg via ORAL
  Filled 2013-09-27 (×3): qty 1

## 2013-09-27 MED ORDER — DOFETILIDE 250 MCG PO CAPS
375.0000 ug | ORAL_CAPSULE | Freq: Two times a day (BID) | ORAL | Status: DC
  Filled 2013-09-27 (×2): qty 1

## 2013-09-27 NOTE — Progress Notes (Signed)
Nursing note Patient had 6 beats of Vtach.patient asymptomatic,  [facility] made aware and also made aware of patients EKG,post medication admisistration QCT 516. Will continue to closely monitor patient. Shynice Sigel, Bettina Gavia RN

## 2013-09-27 NOTE — Progress Notes (Signed)
Reviewed post Tikosyn ECG from 12:09 today with Dr. Lovena Le. Manual QTc 480 msec. Reviewed telemetry - afib with aberrancy. Continue Tikosyn 500 mcg every 12 hours per protocol.

## 2013-09-27 NOTE — Progress Notes (Addendum)
    Patient: [name] Date of Encounter: 09/27/2013, 7:55 AM Admit date: 09/26/2013     Subjective  Mr. [name] has no complaints this AM. He denies CP, SOB or palpitations.   Objective  Physical Exam: Vitals: BP 142/92  Pulse 76  Temp(Src) 98.1 F (36.7 C) (Oral)  Resp 18  SpO2 98% General: Well developed, well appearing 63 year old male in no acute distress. Neck: Supple. JVD not elevated. Lungs: Clear bilaterally to auscultation without wheezes, rales, or rhonchi. Breathing is unlabored. Heart: Irregular S1 S2 without murmurs, rubs, or gallops.  Abdomen: Soft, non-distended. Extremities: No clubbing or cyanosis. No edema.  Distal pedal pulses are 2+ and equal bilaterally. Neuro: Alert and oriented X 3. Moves all extremities spontaneously. No focal deficits.  Intake/Output:  Intake/Output Summary (Last 24 hours) at 09/27/13 0755 Last data filed at 09/26/13 1700  Gross per 24 hour  Intake    240 ml  Output      0 ml  Net    240 ml    Inpatient Medications:  . dofetilide  500 mcg Oral Q12H  . lisinopril  40 mg Oral [id number]  . metoprolol succinate  50 mg Oral Daily  . Rivaroxaban  20 mg Oral Daily  . sodium chloride  3 mL Intravenous Q12H    Labs:  Recent Labs  09/26/13 1016 09/26/13 1848 09/27/13 0550  NA 139 141 144  K 3.8 4.1 4.0  CL 104 99 104  CO2 27 30 28   GLUCOSE 69* 88 102*  BUN 20 23 20   CREATININE 0.9 0.97 0.94  CALCIUM 9.0 9.5 8.9  MG 2.0  --  2.1    Radiology/Studies: No results found.  Telemetry: rate controlled atrial fibrillation with occasional PVCs 12-lead ECG: post Tikosyn last night - atrial fibrillation with prolonged QT 504 msec   Assessment and Plan  1. Persistent atrial fibrillation - rate controlled - potassium, magnesium and renal function stable - QT prolonged so will decrease dose to 375 mcg and follow   Signed, EDMISTEN, BROOKE PA-C  ADDENDUM: Spoke with Dr.Hardeep Reetz regarding QT interval. At this time, he  recommended Tikosyn 500 mcg dose be continued per protocol.   EP Attending  Patient seen and examined. Agree with above. His QT interval is borderline. Will continue Dofetilide. Will plan DCCV tomorrow. Discharge on Thursday.   Mikle Bosworth.D.

## 2013-09-27 NOTE — Progress Notes (Signed)
Pre Tikosyn administration, EKG QTc was 428 ms, two hours post administration EKG QTc was 497 ms.

## 2013-09-28 ENCOUNTER — Inpatient Hospital Stay (HOSPITAL_COMMUNITY): Payer: BC Managed Care – PPO | Admitting: Certified Registered"

## 2013-09-28 ENCOUNTER — Encounter (HOSPITAL_COMMUNITY)
Admission: AD | Disposition: A | Discharge status: Home / Self Care | Payer: Self-pay | Source: Ambulatory Visit | Attending: Internal Medicine

## 2013-09-28 ENCOUNTER — Encounter (HOSPITAL_COMMUNITY): Payer: BC Managed Care – PPO | Admitting: Certified Registered"

## 2013-09-28 ENCOUNTER — Encounter (HOSPITAL_COMMUNITY): Payer: Self-pay | Admitting: Certified Registered"

## 2013-09-28 DIAGNOSIS — I4891 Unspecified atrial fibrillation: Secondary | ICD-10-CM

## 2013-09-28 HISTORY — PX: CARDIOVERSION: SHX1299

## 2013-09-28 LAB — CBC
HEMATOCRIT: 42.9 % (ref 39.0–52.0)
Hemoglobin: 14.7 g/dL (ref 13.0–17.0)
MCH: 28.5 pg (ref 26.0–34.0)
MCHC: 34.3 g/dL (ref 30.0–36.0)
MCV: 83.3 fL (ref 78.0–100.0)
Platelets: 162 10*3/uL (ref 150–400)
RBC: 5.15 MIL/uL (ref 4.22–5.81)
RDW: 13.1 % (ref 11.5–15.5)
WBC: 9.3 10*3/uL (ref 4.0–10.5)

## 2013-09-28 LAB — BASIC METABOLIC PANEL
BUN: 17 mg/dL (ref 6–23)
BUN: 16 mg/dL (ref 6–23)
CALCIUM: 8.8 mg/dL (ref 8.4–10.5)
CHLORIDE: 101 meq/L (ref 96–112)
CO2: 29 mEq/L (ref 19–32)
CO2: 27 mEq/L (ref 19–32)
Calcium: 8.9 mg/dL (ref 8.4–10.5)
Chloride: 102 mEq/L (ref 96–112)
Creatinine, Ser: 0.92 mg/dL (ref 0.50–1.35)
Creatinine, Ser: 0.79 mg/dL (ref 0.50–1.35)
GFR calc Af Amer: >90 mL/min (ref >90)
GFR calc Af Amer: >90 mL/min (ref >90)
GFR calc non Af Amer: >90 mL/min (ref >90)
GFR, EST NON AFRICAN AMERICAN: 90 mL/min — AB (ref >90)
GLUCOSE: 95 mg/dL (ref 70–99)
Glucose, Bld: 94 mg/dL (ref 70–99)
POTASSIUM: 3.8 meq/L (ref 3.7–5.3)
Potassium: 3.7 mEq/L (ref 3.7–5.3)
Sodium: 142 mEq/L (ref 137–147)
Sodium: 140 mEq/L (ref 137–147)

## 2013-09-28 LAB — POTASSIUM: Potassium: 4.1 mEq/L (ref 3.7–5.3)

## 2013-09-28 LAB — MAGNESIUM: Magnesium: 2.1 mg/dL (ref 1.5–2.5)

## 2013-09-28 SURGERY — CARDIOVERSION
Anesthesia: General

## 2013-09-28 MED ORDER — SODIUM CHLORIDE 0.9 % IV SOLN: 250.0000 mL | INTRAVENOUS | Status: DC

## 2013-09-28 MED ORDER — SODIUM CHLORIDE 0.9 % IJ SOLN: 3.0000 mL | Freq: Two times a day (BID) | INTRAMUSCULAR | Status: DC

## 2013-09-28 MED ORDER — POTASSIUM CHLORIDE CRYS ER 20 MEQ PO TBCR
40.0000 meq | EXTENDED_RELEASE_TABLET | Freq: Once | ORAL | Status: AC
  Administered 2013-09-28: 40 meq via ORAL
  Filled 2013-09-28: qty 2

## 2013-09-28 MED ORDER — PROPOFOL 10 MG/ML IV BOLUS
INTRAVENOUS | Status: DC | PRN
  Administered 2013-09-28: 100 mg via INTRAVENOUS

## 2013-09-28 MED ORDER — SODIUM CHLORIDE 0.9 % IJ SOLN: 3.0000 mL | INTRAMUSCULAR | Status: DC | PRN

## 2013-09-28 NOTE — CV Procedure (Signed)
    Electrical Cardioversion Procedure Note [name] [id number] September 28, 1950  Procedure: Electrical Cardioversion Indications:  Atrial Fibrillation  Procedure Details Consent: Risks of procedure as well as the alternatives and risks of each were explained to the (patient/caregiver).  Consent for procedure obtained. Time Out: Verified patient identification, verified procedure, site/side was marked, verified correct patient position, special equipment/implants available, medications/allergies/relevent history reviewed, required imaging and test results available.  Performed  Patient placed on cardiac monitor, pulse oximetry, supplemental oxygen as necessary.  Sedation given: Short-acting barbiturates Pacer pads placed anterior and posterior chest.  Cardioverted 1 time(s).  Cardioverted at 120J.  Evaluation Findings: Post procedure EKG shows: NSR Complications: None Patient did tolerate procedure well.   [name] [facility] 09/28/2013, 11:10 AM

## 2013-09-28 NOTE — Anesthesia Postprocedure Evaluation (Signed)
  Anesthesia Post-op Note  Patient: [name]  Procedure(s) Performed: Procedure(s): CARDIOVERSION BEDSIDE (N/A)  Patient Location: Nursing Unit  Anesthesia Type:General  Level of Consciousness: awake, alert , oriented and patient cooperative  Airway and Oxygen Therapy: Patient Spontanous Breathing and Patient connected to nasal cannula oxygen  Post-op Pain: none  Post-op Assessment: Post-op Vital signs reviewed, Patient's Cardiovascular Status Stable, Respiratory Function Stable, Patent Airway and No signs of Nausea or vomiting  Post-op Vital Signs: Reviewed and stable  Complications: No apparent anesthesia complications

## 2013-09-28 NOTE — H&P (View-Only) (Signed)
    Patient: [name] Date of Encounter: 09/28/2013, 8:42 AM Admit date: 09/26/2013     Subjective  Mr. Berisha has no complaints this AM. He denies CP, SOB or palpitations.   Objective  Physical Exam: Vitals: BP 152/89  Pulse 77  Temp(Src) 97.8 F (36.6 C) (Oral)  Resp 19  SpO2 96% General: Well developed, well appearing 63 year old male in no acute distress. Neck: Supple. JVD not elevated. Lungs: Clear bilaterally to auscultation without wheezes, rales, or rhonchi. Breathing is unlabored. Heart: Irregular S1 S2 without murmurs, rubs, or gallops.  Abdomen: Soft, non-distended. Extremities: No clubbing or cyanosis. No edema.  Distal pedal pulses are 2+ and equal bilaterally. Neuro: Alert and oriented X 3. Moves all extremities spontaneously. No focal deficits.  Intake/Output:  Intake/Output Summary (Last 24 hours) at 09/28/13 0842 Last data filed at 09/27/13 1630  Gross per 24 hour  Intake    480 ml  Output      0 ml  Net    480 ml    Inpatient Medications:  . dofetilide  500 mcg Oral Q12H  . lisinopril  40 mg Oral BH-q7a  . metoprolol succinate  50 mg Oral Daily  . Rivaroxaban  20 mg Oral Q breakfast  . sodium chloride  3 mL Intravenous Q12H    Labs:  Recent Labs  09/27/13 0550 09/28/13 0302  NA 144 142  K 4.0 3.8  CL 104 101  CO2 28 29  GLUCOSE 102* 95  BUN 20 17  CREATININE 0.94 0.92  CALCIUM 8.9 8.9  MG 2.1 2.1    Radiology/Studies: No results found.  Telemetry: atrial fibrillation with RVR this AM, occasional PVCs, occasional aberrant conduction 12-lead ECG: atrial fibrillation at 67 bpm; QTc 430 msec but difficult to know in afib   Assessment and Plan  1. Persistent atrial fibrillation - with elevated V rates this AM - potassium 3.8 so will replete per protocol and recheck BMET - magnesium and renal function stable - continue Tikosyn 500 mcg every 12 hours per protocol - for DCCV today   Signed, EDMISTEN, BROOKE PA-C  EP  Attending  Patient seen and examined. Agree with above history, physical exam, assessment and plan. He remains in atrial fib with an appropriate QTC on Tikosyn. Will plan DCCV later this morning.  Ellinore Merced,M.D. 

## 2013-09-28 NOTE — Interval H&P Note (Signed)
History and Physical Interval Note:  09/28/2013 11:03 AM  [name]  has presented today for surgery, with the diagnosis of a fib  The various methods of treatment have been discussed with the patient and family. After consideration of risks, benefits and other options for treatment, the patient has consented to  Procedure(s): CARDIOVERSION BEDSIDE (N/A) as a surgical intervention .  The patient's history has been reviewed, patient examined, no change in status, stable for surgery.  I have reviewed the patient's chart and labs.  Questions were answered to the patient's satisfaction.     Collier Salina [facility] 09/28/2013 11:04 AM

## 2013-09-28 NOTE — Anesthesia Preprocedure Evaluation (Addendum)
Anesthesia Evaluation  Patient identified by MRN, date of birth, ID band Patient awake    Reviewed: Allergy & Precautions, H&P , NPO status , Patient's Chart, lab work & pertinent test results  History of Anesthesia Complications (+) PONV and history of anesthetic complications  Airway Mallampati: II TM Distance: >3 FB Neck ROM: Full    Dental  (+) Teeth Intact, Dental Advisory Given   Pulmonary sleep apnea and Continuous Positive Airway Pressure Ventilation ,    Pulmonary exam normal       Cardiovascular hypertension, Rhythm:Irregular     Neuro/Psych negative neurological ROS  negative psych ROS   GI/Hepatic negative GI ROS, Neg liver ROS,   Endo/Other  negative endocrine ROS  Renal/GU negative Renal ROS     Musculoskeletal   Abdominal   Peds  Hematology   Anesthesia Other Findings   Reproductive/Obstetrics                          Anesthesia Physical Anesthesia Plan  ASA: III  Anesthesia Plan: General   Post-op Pain Management:    Induction: Intravenous  Airway Management Planned: Mask  Additional Equipment:   Intra-op Plan:   Post-operative Plan: Extubation in OR  Informed Consent: I have reviewed the patients History and Physical, chart, labs and discussed the procedure including the risks, benefits and alternatives for the proposed anesthesia with the patient or authorized representative who has indicated his/her understanding and acceptance.   Dental advisory given  Plan Discussed with:   Anesthesia Plan Comments:        Anesthesia Quick Evaluation

## 2013-09-28 NOTE — Anesthesia Procedure Notes (Signed)
Date/Time: 09/28/2013 11:00 AM Performed by: Julian Reil Pre-anesthesia Checklist: Patient identified, Emergency Drugs available, Suction available, Patient being monitored and Timeout performed Patient Re-evaluated:Patient Re-evaluated prior to inductionOxygen Delivery Method: Ambu bag Preoxygenation: Pre-oxygenation with 100% oxygen Intubation Type: IV induction

## 2013-09-28 NOTE — Progress Notes (Signed)
    Patient: [name] Date of Encounter: 09/28/2013, 8:42 AM Admit date: 09/26/2013     Subjective  [name] has no complaints this AM. He denies CP, SOB or palpitations.   Objective  Physical Exam: Vitals: BP 152/89  Pulse 77  Temp(Src) 97.8 F (36.6 C) (Oral)  Resp 19  SpO2 96% General: Well developed, well appearing 63 year old male in no acute distress. Neck: Supple. JVD not elevated. Lungs: Clear bilaterally to auscultation without wheezes, rales, or rhonchi. Breathing is unlabored. Heart: Irregular S1 S2 without murmurs, rubs, or gallops.  Abdomen: Soft, non-distended. Extremities: No clubbing or cyanosis. No edema.  Distal pedal pulses are 2+ and equal bilaterally. Neuro: Alert and oriented X 3. Moves all extremities spontaneously. No focal deficits.  Intake/Output:  Intake/Output Summary (Last 24 hours) at 09/28/13 0842 Last data filed at 09/27/13 1630  Gross per 24 hour  Intake    480 ml  Output      0 ml  Net    480 ml    Inpatient Medications:  . dofetilide  500 mcg Oral Q12H  . lisinopril  40 mg Oral BH-q7a  . metoprolol succinate  50 mg Oral Daily  . Rivaroxaban  20 mg Oral Q breakfast  . sodium chloride  3 mL Intravenous Q12H    Labs:  Recent Labs  09/27/13 0550 09/28/13 0302  NA 144 142  K 4.0 3.8  CL 104 101  CO2 28 29  GLUCOSE 102* 95  BUN 20 17  CREATININE 0.94 0.92  CALCIUM 8.9 8.9  MG 2.1 2.1    Radiology/Studies: No results found.  Telemetry: atrial fibrillation with RVR this AM, occasional PVCs, occasional aberrant conduction 12-lead ECG: atrial fibrillation at 67 bpm; QTc 430 msec but difficult to know in afib   Assessment and Plan  1. Persistent atrial fibrillation - with elevated V rates this AM - potassium 3.8 so will replete per protocol and recheck BMET - magnesium and renal function stable - continue Tikosyn 500 mcg every 12 hours per protocol - for DCCV today   Signed, EDMISTEN, BROOKE PA-C  EP  Attending  Patient seen and examined. Agree with above history, physical exam, assessment and plan. He remains in atrial fib with an appropriate QTC on Tikosyn. Will plan DCCV later this morning.  Mikle Bosworth.D.

## 2013-09-28 NOTE — Preoperative (Signed)
Beta Blockers   Reason not to administer Beta Blockers:Not Applicable 

## 2013-09-28 NOTE — Transfer of Care (Signed)
Immediate Anesthesia Transfer of Care Note  Patient: [name]  Procedure(s) Performed: Procedure(s): CARDIOVERSION BEDSIDE (N/A)  Patient Location: Nursing Unit  Anesthesia Type:General  Level of Consciousness: patient cooperative and responds to stimulation  Airway & Oxygen Therapy: Patient Spontanous Breathing and Patient connected to nasal cannula oxygen  Post-op Assessment: Report given to PACU RN, Post -op Vital signs reviewed and stable and Patient moving all extremities  Post vital signs: Reviewed and stable  Complications: No apparent anesthesia complications

## 2013-09-29 LAB — BASIC METABOLIC PANEL
BUN: 25 mg/dL — ABNORMAL HIGH (ref 6–23)
CALCIUM: 9.3 mg/dL (ref 8.4–10.5)
CO2: 29 meq/L (ref 19–32)
Chloride: 103 mEq/L (ref 96–112)
Creatinine, Ser: 1.04 mg/dL (ref 0.50–1.35)
GFR calc Af Amer: 88 mL/min — ABNORMAL LOW (ref >90)
GFR calc non Af Amer: 76 mL/min — ABNORMAL LOW (ref >90)
GLUCOSE: 103 mg/dL — AB (ref 70–99)
POTASSIUM: 4.5 meq/L (ref 3.7–5.3)
Sodium: 141 mEq/L (ref 137–147)

## 2013-09-29 LAB — MAGNESIUM: MAGNESIUM: 2.2 mg/dL (ref 1.5–2.5)

## 2013-09-29 MED ORDER — DOFETILIDE 500 MCG PO CAPS: 500.0000 ug | ORAL_CAPSULE | Freq: Two times a day (BID) | ORAL | Status: DC

## 2013-09-29 NOTE — Progress Notes (Signed)
    Patient: [name] Date of Encounter: 09/29/2013, 7:41 AM Admit date: 09/26/2013     Subjective  [name] has no complaints this AM. He denies CP, SOB or palpitations.    Objective  Physical Exam: Vitals: BP 130/81  Pulse 55  Temp(Src) 98.2 F (36.8 C) (Oral)  Resp 18  SpO2 100% General: Well developed, well appearing 63 year old male in no acute distress. Neck: Supple. JVD not elevated. Lungs: Clear bilaterally to auscultation without wheezes, rales, or rhonchi. Breathing is unlabored. Heart: Regular S1 S2 without murmurs, rubs, or gallops.  Abdomen: Soft, non-distended. Extremities: No clubbing or cyanosis. No edema.  Distal pedal pulses are 2+ and equal bilaterally. Neuro: Alert and oriented X 3. Moves all extremities spontaneously. No focal deficits.  Intake/Output:  Intake/Output Summary (Last 24 hours) at 09/29/13 0741 Last data filed at 09/28/13 1700  Gross per 24 hour  Intake    580 ml  Output      0 ml  Net    580 ml    Inpatient Medications:  . dofetilide  500 mcg Oral Q12H  . lisinopril  40 mg Oral BH-q7a  . metoprolol succinate  50 mg Oral Daily  . Rivaroxaban  20 mg Oral Q breakfast  . sodium chloride  3 mL Intravenous Q12H  . sodium chloride  3 mL Intravenous Q12H    Labs:  Recent Labs  09/28/13 0302 09/28/13 1130 09/28/13 1825 09/29/13 0550  NA 142 140  --  141  K 3.8 3.7 4.1 4.5  CL 101 102  --  103  CO2 29 27  --  29  GLUCOSE 95 94  --  103*  BUN 17 16  --  25*  CREATININE 0.92 0.79  --  1.04  CALCIUM 8.9 8.8  --  9.3  MG 2.1  --   --  2.2    Radiology/Studies: No results found.  Telemetry: SR 12-lead ECG: SR with first degree AV block; QTc 497 msec    Assessment and Plan  1. Persistent atrial fibrillation - maintaining SR post DCCV yesterday - potassium, magnesium and renal function stable - QT prolonged so may need to decrease dose to 375 mcg; MD to advise  Signed, Lance January PA-C

## 2013-09-29 NOTE — Progress Notes (Signed)
3 hours after tykosin administration pt's QTc 504 ms. On-call MD paged and notified. 12-lead EKG in front of pt's chart. No new orders, will continue to monitor.

## 2013-09-29 NOTE — Discharge Summary (Signed)
ELECTROPHYSIOLOGY DISCHARGE SUMMARY   Patient ID: [name],  MRN: [id number], DOB/AGE: 03-08-1951 63 y.o.  Admit date: 09/26/2013 Discharge date: 09/29/2013  Primary Care Physician: Seward Carol, MD Primary EP: Rayann Heman, MD  Primary Discharge Diagnosis:  1. Persistent atrial fibrillation, now in SR s/p Tikosyn loading and DCCV  Secondary Discharge Diagnoses:  1. OSA  2. Obesity 3. HTN  Procedures This Admission:  1. Elective direct current cardioversion Patient placed on cardiac monitor, 61 pulse oximetry, supplemental oxygen as necessary.  Sedation given: Short-acting barbiturates  Pacer pads placed anterior and posterior chest.  Cardioverted 1 time(s).  Cardioverted at 120J.  Evaluation  Findings: Post procedure EKG shows: NSR  Complications: None  Patient did tolerate procedure well.   History and Hospital Course:  [name] is a 63 year old gentleman with persistent atrial fibrillaiton, OSA and obesity who was last seen by Dr. Rayann Heman in January. He reports being initially diagnosed with atrial fibrillation in the fall. He underwent cardioversion November 2014 but quickly returned to afib. He has not tried AAD therapy. Plan was to start Tikosyn in February 3-4 weeks after carpal tunnel surgery. Pt called to report he was feeling better so he did not proceed at that time. Since then, he has noticed increased symptoms associated with afib so would like to proceed with admission at this time. He was seen in the office by Elberta Leatherwood, PharmD on 09/26/2013. He took his last dose of HCTZ on 09/22/2013. He is currently not taking any other QTc prolongating or contraindicated medications. He reports compliance with Xarelto and has not missed any doses in the past 30 days. ECG reviewed in the office by Dr. Rayann Heman - afib with V rate of 75 bpm, QTc 439 msec. He was admitted for Tikosyn loading on 09/26/2013. His potassium was repleted. He was then started on Tikosyn 500 micrograms  every 12 hours per protocol. His renal function, potassium, magnesium and QTc remained stable. On 09/28/2013, he underwent DCCV which was successful for restoring SR. He remained hemodynamically stable and in SR. On 09/29/2013, he was seen, examined and deemed stable for discharge by Dr. Jolyn Nap. He will follow-up in one week with Elberta Leatherwood, PharmD for post hospital Tikosyn f/u with labs and ECG. He will see Dr. Rayann Heman in 4 weeks.  Discharge Vitals: Blood pressure 130/81, pulse 61, temperature 98.2 F (36.8 C), temperature source Oral, resp. rate 18, SpO2 100.00%.   Labs: Lab Results  Component Value Date   WBC 9.3 09/28/2013   HGB 14.7 09/28/2013   HCT 42.9 09/28/2013   MCV 83.3 09/28/2013   PLT 162 09/28/2013    Recent Labs Lab 09/29/13 0550  NA 141  K 4.5  CL 103  CO2 29  BUN 25*  CREATININE 1.04  CALCIUM 9.3  GLUCOSE 103*  Magnesium 2.2  Disposition:  The patient is being discharged in stable condition.  Follow-up: Follow-up Information   Follow up with Aris Georgia, [place] On 10/06/2013. (At 10:15 AM for tikosyn follow-up, ECG and labs)    Specialty:  Pharmacist   Contact information:   [address] [place] [address] [phone]       Follow up with Thompson Grayer, MD On 10/28/2013. (At 11:15 AM)    Specialty:  Cardiology   Contact information:   [address] [place] [address] [phone]      Discharge Medications:    Medication List    STOP taking these medications  hydrochlorothiazide 12.5 MG capsule  Commonly known as:  MICROZIDE      TAKE these medications       aspirin 81 MG tablet  Take 81 mg by mouth daily.     dofetilide 500 MCG capsule  Commonly known as:  TIKOSYN  Take 1 capsule (500 mcg total) by mouth every 12 (twelve) hours.     lisinopril 40 MG tablet  Commonly known as:  PRINIVIL,ZESTRIL  Take 40 mg by mouth every morning.     metoprolol succinate 50 MG 24 hr tablet  Commonly known as:   TOPROL-XL  Take 1 tablet (50 mg total) by mouth daily.     Rivaroxaban 20 MG Tabs tablet  Commonly known as:  XARELTO  Take 1 tablet (20 mg total) by mouth daily.       Duration of Discharge Encounter: Greater than 30 minutes including physician time.  Signed, Ileene Hutchinson, PA-C 09/29/2013, 12:47 PM

## 2013-09-29 NOTE — Care Management Note (Signed)
    Page 1 of 1   09/29/2013     1:51:57 PM   CARE MANAGEMENT NOTE 09/29/2013  Patient:  [name], [name]   Account Number:  [id number]  Date Initiated:  09/29/2013  Documentation initiated by:  Michaella Imai  Subjective/Objective Assessment:   PT WITH AFIB/FLUTTER ADM FOR TIKOSYN LOADING.  PTA, PT INDEPENDENT, LIVES WITH SPOUSE.     Action/Plan:   CHECKED WITH PT'S PHARMACY, [facility] [place]:  THEY DO DISPENSE TIKOSYN AND THEY HAVE PT'S DOSE IN STOCK.  PT WILL RECEIVE WEEK'S SUPPLY OF MED PRIOR TO DC.   Anticipated DC Date:  09/29/2013   Anticipated DC Plan:  HOME/SELF CARE      DC Planning Services  CM consult  Medication Assistance      Choice offered to / List presented to:             Status of service:  Completed, signed off Medicare Important Message given?   (If response is "NO", the following Medicare IM given date fields will be blank) Date Medicare IM given:   Date Additional Medicare IM given:    Discharge Disposition:  HOME/SELF CARE  Per UR Regulation:  Reviewed for med. necessity/level of care/duration of stay  If discussed at [place] of Stay Meetings, dates discussed:    Comments:  09/29/13 Kathyjo Briere,RN,BSN [phone] PT GIVEN $10 [place].  WOULD BE $65 WITHOUT THIS CARD.  HE IS APPRECIATIVE OF HELP.  HE HAS RECEIVED HIS ONE WEEK'S SUPPLY PRIOR TO DC.

## 2013-09-29 NOTE — Progress Notes (Signed)
I have reviewed ECGs.. QTC is 419 millisecond range  We will plan to have him followup in one weeks time or so in the dofetilide followup clinic.

## 2013-09-29 NOTE — Discharge Instructions (Signed)
DO NOT start any new medications (over-the-counter or prescription) without notifying Dr. Rayann Heman and/or Elberta Leatherwood, PharmD first.

## 2013-09-29 NOTE — Progress Notes (Signed)
PT TIKOSYN WAS GIVEN  AT 12NOON. VERIFIED WITH PHARMACY TIME CHANGE. PHARMACY CHANGE TIMES FOR MEDICATION ADMIN MONITORING WILL CONTINUE.

## 2013-09-30 ENCOUNTER — Encounter (HOSPITAL_COMMUNITY): Payer: Self-pay | Admitting: Cardiology

## 2013-09-30 NOTE — Progress Notes (Signed)
PT DISCHARGE HOME WITH WIFE. PT WAS GIVE 1 WEEK SUPPLY OF TIKOSYN FROM PHARMACY. REVIEWED DISCHARGE INSTRUCTIONS WITH PT. PT VU. ENCOURAGED PT TO CALL WITH ANY NEEDS.

## 2013-10-04 ENCOUNTER — Ambulatory Visit (INDEPENDENT_AMBULATORY_CARE_PROVIDER_SITE_OTHER): Payer: BC Managed Care – PPO | Admitting: Pharmacist Clinician (PhC)/ Clinical Pharmacy Specialist

## 2013-10-04 DIAGNOSIS — Z79899 Other long term (current) drug therapy: Secondary | ICD-10-CM

## 2013-10-04 DIAGNOSIS — I4891 Unspecified atrial fibrillation: Secondary | ICD-10-CM

## 2013-10-04 DIAGNOSIS — Z5181 Encounter for therapeutic drug level monitoring: Secondary | ICD-10-CM

## 2013-10-04 LAB — BASIC METABOLIC PANEL
BUN: 21 mg/dL (ref 6–23)
CO2: 29 mEq/L (ref 19–32)
Calcium: 9.1 mg/dL (ref 8.4–10.5)
Chloride: 102 mEq/L (ref 96–112)
Creatinine, Ser: 1 mg/dL (ref 0.4–1.5)
GFR: 77.21 mL/min (ref >60.00)
Glucose, Bld: 107 mg/dL — ABNORMAL HIGH (ref 70–99)
Potassium: 3.9 mEq/L (ref 3.5–5.1)
Sodium: 138 mEq/L (ref 135–145)

## 2013-10-04 LAB — MAGNESIUM: Magnesium: 2 mg/dL (ref 1.5–2.5)

## 2013-10-04 MED ORDER — POTASSIUM CHLORIDE ER 10 MEQ PO TBCR: EXTENDED_RELEASE_TABLET | ORAL | Status: DC

## 2013-10-04 NOTE — Progress Notes (Signed)
     10/04/2013 [name] 07/09/1951 [id number]   HPI:  The patient presents today for Tikosyn follow up. He was admitted from 3/16-3/19 and started on Tikosyn 52mcg BID. Pt remained in afib and required DCCV on 3/18.  After DCCV, he maintained NSR until after discharge Thursday afternoon.  Pt reports that Friday morning he started feeling as if he was out of rhythm again.  He noticed SOB and some chest pressure.   He didn't always notice it while he was up and active, but once he would sit down to rest he could often tell.  No side effects or complaints about the medication itself.  He comes in today and ECG shows him in AF with a QTC of 471.  Pt states that he would not like to do DCCV again, nor would he want any surgical procedures at this time.  Took ECG to review with [employer] PA.    Current Outpatient Prescriptions  Medication Sig Dispense Refill  . aspirin 81 MG tablet Take 81 mg by mouth daily.      Marland Kitchen dofetilide (TIKOSYN) 500 MCG capsule Take 1 capsule (500 mcg total) by mouth every 12 (twelve) hours.  60 capsule  4  . lisinopril (PRINIVIL,ZESTRIL) 40 MG tablet Take 40 mg by mouth every morning.      . metoprolol succinate (TOPROL-XL) 50 MG 24 hr tablet Take 1 tablet (50 mg total) by mouth daily.  30 tablet  6  . Rivaroxaban (XARELTO) 20 MG TABS tablet Take 1 tablet (20 mg total) by mouth daily.  30 tablet  6   No current facility-administered medications for this visit.    Allergies  Allergen Reactions  . Oxycodone Rash    Past Medical History  Diagnosis Date  . PONV (postoperative nausea and vomiting)     slow to wake up after  . Hypertension   . Rotator cuff tear, right dec 2012    physical therapy done, decreased strength  . Persistent atrial fibrillation     cardioversion 06/06/13  . Obesity   . Atrial enlargement, left   . Hearing aid worn     both ears  . Wears glasses   . OSA on CPAP     "mask adjusts to what I need" (09/26/2013)  . Arthritis    "joints" (09/26/2013)     ASSESSMENT AND PLAN:  [name] reviewed ECG with Dr. Rayann Heman.  At this point we will continue patient on Tikosyn 530mcg bid.   He had BMET and Magnesium drawn in office today.  Potassium was slightly low at 3.9 from hospital d/c level of 4.5.  Magnesium fine at 1.8  Prescription called to pharmacy for potassium 24mEq with directions of 2 tabs daily x 2 days, then hold.  Will recommend repeat BMET when he sees Dr. Rayann Heman in April.      Tommy Medal PharmD CPP [facility]

## 2013-10-06 ENCOUNTER — Encounter: Payer: BC Managed Care – PPO | Admitting: Pharmacist

## 2013-10-17 ENCOUNTER — Telehealth: Payer: Self-pay | Admitting: Internal Medicine

## 2013-10-17 DIAGNOSIS — I4891 Unspecified atrial fibrillation: Secondary | ICD-10-CM

## 2013-10-17 MED ORDER — FUROSEMIDE 20 MG PO TABS: 20.0000 mg | ORAL_TABLET | Freq: Every day | ORAL | Status: DC

## 2013-10-17 NOTE — Addendum Note (Signed)
Addended by: Katrine Coho on: 10/17/2013 09:30 AM   Modules accepted: Orders, Medications

## 2013-10-17 NOTE — Telephone Encounter (Signed)
Appt with Dr Rayann Heman is 10/28/13.

## 2013-10-17 NOTE — Telephone Encounter (Signed)
New message   Patient calling C/O retain fluid, going into cough spells, patient stated he does not wants it to lead to CHF.     Weight taken this am up lbs. Since yesterday.    Asking to be seen today

## 2013-10-17 NOTE — Telephone Encounter (Signed)
Spoke with patient. Pt states his weight was increased 9 lbs on Friday 10/14/13. On Saturday he did not make any changes, he did urinate more frequently and his weight was back to normal (278lbs) by Sunday morning. Pt states this morning his weight is up to 283lbs(up 5 pounds) and he is more SOB.

## 2013-10-17 NOTE — Telephone Encounter (Signed)
Reviewed with Dr Rolla Flatten lasix 20mg  daily and take KCL 20 mEq daily, BMET in 1 week, keep appt with Dr Rayann Heman.

## 2013-10-24 ENCOUNTER — Other Ambulatory Visit (INDEPENDENT_AMBULATORY_CARE_PROVIDER_SITE_OTHER): Payer: BC Managed Care – PPO

## 2013-10-24 DIAGNOSIS — I4891 Unspecified atrial fibrillation: Secondary | ICD-10-CM

## 2013-10-24 LAB — BASIC METABOLIC PANEL
BUN: 21 mg/dL (ref 6–23)
CHLORIDE: 103 meq/L (ref 96–112)
CO2: 29 mEq/L (ref 19–32)
Calcium: 8.9 mg/dL (ref 8.4–10.5)
Creatinine, Ser: 0.9 mg/dL (ref 0.4–1.5)
GFR: 93.61 mL/min (ref >60.00)
GLUCOSE: 88 mg/dL (ref 70–99)
POTASSIUM: 3.9 meq/L (ref 3.5–5.1)
SODIUM: 139 meq/L (ref 135–145)

## 2013-10-28 ENCOUNTER — Ambulatory Visit (INDEPENDENT_AMBULATORY_CARE_PROVIDER_SITE_OTHER): Payer: BC Managed Care – PPO | Admitting: Internal Medicine

## 2013-10-28 ENCOUNTER — Encounter: Payer: Self-pay | Admitting: Internal Medicine

## 2013-10-28 VITALS — BP 156/76 | HR 61 | Ht 70.0 in | Wt 284.0 lb

## 2013-10-28 DIAGNOSIS — I10 Essential (primary) hypertension: Secondary | ICD-10-CM

## 2013-10-28 DIAGNOSIS — I517 Cardiomegaly: Secondary | ICD-10-CM

## 2013-10-28 DIAGNOSIS — I4891 Unspecified atrial fibrillation: Secondary | ICD-10-CM

## 2013-10-28 NOTE — Patient Instructions (Signed)
Your physician recommends that you schedule a follow-up appointment in: 6 weeks with Ileene Hutchinson, PA with labs same day (BMP/MAG) and 3 months with Dr Rayann Heman  Your physician recommends that you return for lab work in: 6 weeks at your return visit

## 2013-10-30 NOTE — Progress Notes (Signed)
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  He presents today in sinus rhythm.  He feels that his exercise tolerance and fatigue are much improved with sinus.  He has had some afib since his hospital discharge initially.  This was accompanies by BLE edema and SOB.  Since converting to sinus however these symptoms are much improved.  Today, he denies symptoms of  presyncope, syncope, or neurologic sequela. The patient is tolerating medications without difficulties and is otherwise without complaint today.   Past Medical History  Diagnosis Date  . PONV (postoperative nausea and vomiting)     slow to wake up after  . Hypertension   . Rotator cuff tear, right dec 2012    physical therapy done, decreased strength  . Persistent atrial fibrillation     cardioversion 06/06/13  . Obesity   . Atrial enlargement, left   . Hearing aid worn     both ears  . Wears glasses   . OSA on CPAP     "mask adjusts to what I need" (09/26/2013)  . Arthritis     "joints" (09/26/2013)   Past Surgical History  Procedure Laterality Date  . Total hip arthroplasty Left 2010  . Total knee arthroplasty Right 2006   . Knee arthroscopy Right 1990's  . Shoulder open rotator cuff repair Left 1990's  . Total hip revision Left 10/08/2011  . Total hip revision  04/09/2012    Procedure: TOTAL HIP REVISION;  Surgeon: Lance Alf, MD;  Location: [facility];  Service: Orthopedics;  Laterality: Left;  Left Hip Acetabular Revision vs Constrained Liner  . Cardioversion N/A 06/06/2013    Procedure: CARDIOVERSION;  Surgeon: Lance Klein, MD;  Location: [facility] ENDOSCOPY;  Service: Cardiovascular;  Laterality: N/A;  . Carpal tunnel release Right 08/08/2013    Procedure: RIGHT CARPAL TUNNEL RELEASE;  Surgeon: Lance Sours, MD;  Location: [place];  Service: Orthopedics;  Laterality: Right;  . Trigger finger release Right 08/08/2013    Procedure: RELEASE STENOSING TENOSYNOVITIS RIGHT THUMB;  Surgeon: Lance Sours, MD;  Location: [place];  Service: Orthopedics;  Laterality: Right;  . Tonsillectomy  1950's  . Shoulder open rotator cuff repair Right 11/2007  . Carpal tunnel release Left 2008  . Cardioversion N/A 09/28/2013    Procedure: CARDIOVERSION BEDSIDE;  Surgeon: Lance M Martinique, MD;  Location: [facility] OR;  Service: Cardiovascular;  Laterality: N/A;    Current Outpatient Prescriptions  Medication Sig Dispense Refill  . aspirin 81 MG tablet Take 81 mg by mouth daily.      Marland Kitchen dofetilide (TIKOSYN) 500 MCG capsule Take 1 capsule (500 mcg total) by mouth every 12 (twelve) hours.  60 capsule  4  . furosemide (LASIX) 20 MG tablet Take 1 tablet (20 mg total) by mouth daily.  30 tablet  0  . KLOR-CON 10 10 MEQ tablet Take 1 tablet by mouth daily.      Marland Kitchen lisinopril (PRINIVIL,ZESTRIL) 40 MG tablet Take 40 mg by mouth every morning.      . metoprolol succinate (TOPROL-XL) 50 MG 24 hr tablet Take 1 tablet (50 mg total) by mouth daily.  30 tablet  6  . Rivaroxaban (XARELTO) 20 MG TABS tablet Take 1 tablet (20 mg total) by mouth daily.  30 tablet  6  No current facility-administered medications for this visit.    Allergies  Allergen Reactions  . Oxycodone Rash    History   Social History  . Marital Status: Married    Spouse Name: N/A    Number of Children: N/A  . Years of Education: N/A   Occupational History  . Not on file.   Social History Main Topics  . Smoking status: Never Smoker   . Smokeless tobacco: Never Used  . Alcohol Use: No  . Drug Use: No  . Sexual Activity: Yes   Other Topics Concern  . Not on file   Social History Narrative   Pt lives in [place] with spouse.  Leadership training and behavioral safety training.    Family History  Problem Relation Age of Onset  . Other       mother had a pacemaker    ROS- All systems are reviewed and negative except as per the HPI above  Physical Exam: Filed Vitals:   10/28/13 1106  BP: 156/76  Pulse: 61  Height: 5\' 10"  (1.778 m)  Weight: 284 lb (128.822 kg)    GEN- The patient is morbidly obese appearing, alert and oriented x 3 today.   Head- normocephalic, atraumatic Eyes-  Sclera clear, conjunctiva pink Ears- hearing intact Oropharynx- clear Neck- supple,  Lungs- Clear to ausculation bilaterally, normal work of breathing Heart- irregular rate and rhythm, no murmurs, rubs or gallops, PMI not laterally displaced GI- soft, NT, ND, + BS Extremities- no clubbing, cyanosis, or edema MS- no significant deformity or atrophy Skin- no rash or lesion Psych- euthymic mood, full affect Neuro- strength and sensation are intact  EKG today reveals sinus rhythm, QTc 463 Echo is reviewed, LA size 83mm Dr [name] notes and epic records are reviewed  Assessment and Plan:  1. Persistent afib He is presently doing much better with sinus rhythm. Continue current tikosyn dosing Return in 6 weeks to follow-up with [name].  He will need repeat BMET, Mg, and ekg then  2. HTN Salt restriction and weight loss are advised He is now on lasix bmet upon return  3. Severe LA enlargement Noted, suggests that his afib is likely longstanding and will be very difficult to manage with rhythm control.  Ultimately he may require a rate control strategy vs an aggressive rhythm approach with MAZE.  4. OSA Compliance with CPAP is advised.  Return to see me in 3 months He will see [name] in 6 weeks

## 2013-10-31 ENCOUNTER — Other Ambulatory Visit: Payer: Self-pay | Admitting: Internal Medicine

## 2013-11-09 ENCOUNTER — Other Ambulatory Visit: Payer: Self-pay | Admitting: Internal Medicine

## 2013-11-29 ENCOUNTER — Other Ambulatory Visit: Payer: Self-pay | Admitting: *Deleted

## 2013-11-29 MED ORDER — METOPROLOL SUCCINATE ER 50 MG PO TB24: 50.0000 mg | ORAL_TABLET | Freq: Every day | ORAL | Status: DC

## 2013-11-29 NOTE — Telephone Encounter (Signed)
Rx was sent to pharmacy electronically. 

## 2013-12-09 ENCOUNTER — Other Ambulatory Visit: Payer: Self-pay | Admitting: Internal Medicine

## 2013-12-09 ENCOUNTER — Other Ambulatory Visit: Payer: Self-pay | Admitting: *Deleted

## 2013-12-20 ENCOUNTER — Ambulatory Visit: Payer: BC Managed Care – PPO | Admitting: Cardiology

## 2013-12-20 ENCOUNTER — Other Ambulatory Visit: Payer: BC Managed Care – PPO

## 2013-12-27 ENCOUNTER — Other Ambulatory Visit: Payer: BC Managed Care – PPO

## 2013-12-27 ENCOUNTER — Encounter: Payer: Self-pay | Admitting: Cardiology

## 2013-12-27 ENCOUNTER — Ambulatory Visit (INDEPENDENT_AMBULATORY_CARE_PROVIDER_SITE_OTHER): Payer: BC Managed Care – PPO | Admitting: Cardiology

## 2013-12-27 VITALS — BP 152/72 | HR 64 | Resp 18 | Wt 280.4 lb

## 2013-12-27 DIAGNOSIS — I480 Paroxysmal atrial fibrillation: Secondary | ICD-10-CM

## 2013-12-27 DIAGNOSIS — I4891 Unspecified atrial fibrillation: Secondary | ICD-10-CM

## 2013-12-27 LAB — BASIC METABOLIC PANEL
BUN: 25 mg/dL — ABNORMAL HIGH (ref 6–23)
CALCIUM: 9.4 mg/dL (ref 8.4–10.5)
CO2: 28 mEq/L (ref 19–32)
CREATININE: 1 mg/dL (ref 0.4–1.5)
Chloride: 103 mEq/L (ref 96–112)
GFR: 78.9 mL/min (ref >60.00)
GLUCOSE: 98 mg/dL (ref 70–99)
Potassium: 3.8 mEq/L (ref 3.5–5.1)
Sodium: 138 mEq/L (ref 135–145)

## 2013-12-27 LAB — MAGNESIUM: Magnesium: 2.1 mg/dL (ref 1.5–2.5)

## 2013-12-27 NOTE — Progress Notes (Signed)
ELECTROPHYSIOLOGY OFFICE NOTE   Patient ID: [name] MRN: [id number], DOB/AGE: 1951-01-28   Date of Visit: 12/27/2013  Primary Physician: Kandice Hams, MD Primary EP: Rayann Heman, MD Reason for Visit: Hospital follow-up  History of Present Illness  [name] is a 63 y.o. male with persistent atrial fibrillation and OSA who presents today for routine electrophysiology followup. He was started on Tikosyn in March 2015. He was last seen by Dr. Rayann Heman 6 weeks ago. Since last being seen in our clinic, he reports he is doing well and has no complaints. He has seen significant improvement in his exercise tolerance and energy level since he is maintaining SR. He is participating in a regular exercise regimen with his son (called "[employer]"). He denies chest pain or shortness of breath. He denies palpitations, dizziness, near syncope or syncope. He denies LE swelling, orthopnea or PND. He is compliant and tolerating medications without difficulty.   Past Medical History Past Medical History  Diagnosis Date  . PONV (postoperative nausea and vomiting)     slow to wake up after  . Hypertension   . Rotator cuff tear, right dec 2012    physical therapy done, decreased strength  . Persistent atrial fibrillation     cardioversion 06/06/13  . Obesity   . Atrial enlargement, left   . Hearing aid worn     both ears  . Wears glasses   . OSA on CPAP     "mask adjusts to what I need" (09/26/2013)  . Arthritis     "joints" (09/26/2013)    Past Surgical History Past Surgical History  Procedure Laterality Date  . Total hip arthroplasty Left 2010  . Total knee arthroplasty Right 2006   . Knee arthroscopy Right 1990's  . Shoulder open rotator cuff repair Left 1990's  . Total hip revision Left 10/08/2011  . Total hip revision  04/09/2012    Procedure: TOTAL HIP REVISION;  Surgeon: Gearlean Alf, MD;  Location: [facility];  Service: Orthopedics;  Laterality: Left;  Left Hip Acetabular Revision  vs Constrained Liner  . Cardioversion N/A 06/06/2013    Procedure: CARDIOVERSION;  Surgeon: Sanda Klein, MD;  Location: [facility] ENDOSCOPY;  Service: Cardiovascular;  Laterality: N/A;  . Carpal tunnel release Right 08/08/2013    Procedure: RIGHT CARPAL TUNNEL RELEASE;  Surgeon: Wynonia Sours, MD;  Location: [place];  Service: Orthopedics;  Laterality: Right;  . Trigger finger release Right 08/08/2013    Procedure: RELEASE STENOSING TENOSYNOVITIS RIGHT THUMB;  Surgeon: Wynonia Sours, MD;  Location: [place];  Service: Orthopedics;  Laterality: Right;  . Tonsillectomy  1950's  . Shoulder open rotator cuff repair Right 11/2007  . Carpal tunnel release Left 2008  . Cardioversion N/A 09/28/2013    Procedure: CARDIOVERSION BEDSIDE;  Surgeon: Peter M Martinique, MD;  Location: [facility] OR;  Service: Cardiovascular;  Laterality: N/A;    Allergies/Intolerances Allergies  Allergen Reactions  . Oxycodone Rash    Current Home Medications Current Outpatient Prescriptions  Medication Sig Dispense Refill  . aspirin 81 MG tablet Take 81 mg by mouth daily.      Marland Kitchen dofetilide (TIKOSYN) 500 MCG capsule Take 1 capsule (500 mcg total) by mouth every 12 (twelve) hours.  60 capsule  4  . furosemide (LASIX) 20 MG tablet TAKE 1 TABLET (20 MG TOTAL) BY MOUTH DAILY.  90 tablet  1  . KLOR-CON 10 10 MEQ tablet Take 1 tablet by mouth daily.      Marland Kitchen  KLOR-CON M10 10 MEQ tablet TAKE 1 TABLET BY MOUTH EVERY DAY AS DIRECTED  90 tablet  1  . lisinopril (PRINIVIL,ZESTRIL) 40 MG tablet Take 40 mg by mouth every morning.      . metoprolol succinate (TOPROL-XL) 50 MG 24 hr tablet Take 1 tablet (50 mg total) by mouth daily.  30 tablet  6  . Rivaroxaban (XARELTO) 20 MG TABS tablet Take 1 tablet (20 mg total) by mouth daily.  30 tablet  6   No current facility-administered medications for this visit.    Social History History   Social History  . Marital Status: Married    Spouse Name: N/A    Number of  Children: N/A  . Years of Education: N/A   Occupational History  . Not on file.   Social History Main Topics  . Smoking status: Never Smoker   . Smokeless tobacco: Never Used  . Alcohol Use: No  . Drug Use: No  . Sexual Activity: Yes   Other Topics Concern  . Not on file   Social History Narrative   Pt lives in [place] with spouse.  Leadership training and behavioral safety training.     Review of Systems General: No chills, fever, night sweats or weight changes Cardiovascular: No chest pain, dyspnea on exertion, edema, orthopnea, palpitations, paroxysmal nocturnal dyspnea Dermatological: No rash, lesions or masses Respiratory: No cough, dyspnea Urologic: No hematuria, dysuria Abdominal: No nausea, vomiting, diarrhea, bright red blood per rectum, melena, or hematemesis Neurologic: No visual changes, weakness, changes in mental status All other systems reviewed and are otherwise negative except as noted above.  Physical Exam Vitals: Blood pressure 152/72, pulse 64, resp. rate 18, weight 280 lb 6.4 oz (127.189 kg).  General: Well developed, well appearing 63 y.o. male in no acute distress. HEENT: Normocephalic, atraumatic. EOMs intact. Sclera nonicteric. Oropharynx clear.  Neck: Supple. No JVD. Lungs: Respirations regular and unlabored, CTA bilaterally. No wheezes, rales or rhonchi. Heart: RRR. S1, S2 present. No murmurs, rub, S3 or S4. Abdomen: Soft, non-distended.  Extremities: No clubbing, cyanosis or edema. PT/Radials 2+ and equal bilaterally. Psych: Normal affect. Neuro: Alert and oriented X 3. Moves all extremities spontaneously.   Diagnostics 12-lead ECG today - sinus bradycardia at 53 bpm with normal intervals; manual QTc 440 msec  Assessment and Plan Persistent atrial fibrillation  - maintaining SR on Tikosyn - potassium, magnesium and renal function have been stable; will repeat BMET and Mg today - QT stable - continue Tikosyn at current dose - return for  follow-up with Dr. Rayann Heman as scheduled in 3 months   Signed, Ileene Hutchinson, PA-C 12/27/2013, 12:14 PM

## 2013-12-29 ENCOUNTER — Other Ambulatory Visit: Payer: Self-pay | Admitting: *Deleted

## 2013-12-29 MED ORDER — RIVAROXABAN 20 MG PO TABS: 20.0000 mg | ORAL_TABLET | Freq: Every day | ORAL | Status: DC

## 2013-12-30 ENCOUNTER — Encounter: Payer: Self-pay | Admitting: Cardiology

## 2014-01-24 ENCOUNTER — Other Ambulatory Visit: Payer: Self-pay | Admitting: Cardiovascular Disease

## 2014-01-25 NOTE — Telephone Encounter (Signed)
Medication was refilled 12/29/2013. #30 with 6 refills.

## 2014-02-23 ENCOUNTER — Other Ambulatory Visit: Payer: Self-pay

## 2014-02-23 MED ORDER — DOFETILIDE 500 MCG PO CAPS: 500.0000 ug | ORAL_CAPSULE | Freq: Two times a day (BID) | ORAL | Status: DC

## 2014-04-03 ENCOUNTER — Other Ambulatory Visit: Payer: Self-pay

## 2014-04-03 ENCOUNTER — Encounter: Payer: Self-pay | Admitting: Internal Medicine

## 2014-04-03 ENCOUNTER — Ambulatory Visit (INDEPENDENT_AMBULATORY_CARE_PROVIDER_SITE_OTHER): Payer: BC Managed Care – PPO | Admitting: Internal Medicine

## 2014-04-03 VITALS — BP 152/70 | HR 53 | Ht 69.5 in | Wt 285.0 lb

## 2014-04-03 DIAGNOSIS — I480 Paroxysmal atrial fibrillation: Secondary | ICD-10-CM

## 2014-04-03 DIAGNOSIS — G4733 Obstructive sleep apnea (adult) (pediatric): Secondary | ICD-10-CM

## 2014-04-03 DIAGNOSIS — I10 Essential (primary) hypertension: Secondary | ICD-10-CM

## 2014-04-03 DIAGNOSIS — I517 Cardiomegaly: Secondary | ICD-10-CM

## 2014-04-03 DIAGNOSIS — I4891 Unspecified atrial fibrillation: Secondary | ICD-10-CM

## 2014-04-03 LAB — MAGNESIUM: Magnesium: 1.9 mg/dL (ref 1.5–2.5)

## 2014-04-03 MED ORDER — AMLODIPINE BESYLATE 5 MG PO TABS: 5.0000 mg | ORAL_TABLET | Freq: Every day | ORAL | Status: DC

## 2014-04-03 NOTE — Progress Notes (Signed)
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 male with a h/o persistent atrial fibrillaiton, OSA (uses CPAP), morbid obestiy, and LA enlargement who presents for EP follow-up.  He presents today in sinus rhythm with Tikosyn.  He feels that his exercise tolerance and fatigue are much improved with sinus.  He has had some afib since his hospital discharge, but usually converts in a number of hours. Using CPAP regularly. No alcohol. BP elevated today and many times at home when pt checks is 992 systolic. He had a high sodium intake yesterday and sodium discretion and diet modification were discussed.  BP rechecked 160/64. Minimal exercise.   Today, he denies symptoms of  presyncope, syncope, or neurologic sequela. The patient is tolerating medications without difficulties and is otherwise without complaint today.   Past Medical History  Diagnosis Date  . PONV (postoperative nausea and vomiting)     slow to wake up after  . Hypertension   . Rotator cuff tear, right dec 2012    physical therapy done, decreased strength  . Persistent atrial fibrillation     cardioversion 06/06/13  . Obesity   . Atrial enlargement, left   . Hearing aid worn     both ears  . Wears glasses   . OSA on CPAP     "mask adjusts to what I need" (09/26/2013)  . Arthritis     "joints" (09/26/2013)   Past Surgical History  Procedure Laterality Date  . Total hip arthroplasty Left 2010  . Total knee arthroplasty Right 2006   . Knee arthroscopy Right 1990's  . Shoulder open rotator cuff repair Left 1990's  . Total hip revision Left 10/08/2011  . Total hip revision  04/09/2012    Procedure: TOTAL HIP REVISION;  Surgeon: Gearlean Alf, MD;  Location: [facility];  Service: Orthopedics;  Laterality: Left;  Left Hip Acetabular Revision vs Constrained Liner  . Cardioversion N/A 06/06/2013    Procedure: CARDIOVERSION;  Surgeon: Sanda Klein, MD;  Location: [facility]  ENDOSCOPY;  Service: Cardiovascular;  Laterality: N/A;  . Carpal tunnel release Right 08/08/2013    Procedure: RIGHT CARPAL TUNNEL RELEASE;  Surgeon: Wynonia Sours, MD;  Location: [place];  Service: Orthopedics;  Laterality: Right;  . Trigger finger release Right 08/08/2013    Procedure: RELEASE STENOSING TENOSYNOVITIS RIGHT THUMB;  Surgeon: Wynonia Sours, MD;  Location: [place];  Service: Orthopedics;  Laterality: Right;  . Tonsillectomy  1950's  . Shoulder open rotator cuff repair Right 11/2007  . Carpal tunnel release Left 2008  . Cardioversion N/A 09/28/2013    Procedure: CARDIOVERSION BEDSIDE;  Surgeon: Peter M Martinique, MD;  Location: [facility] OR;  Service: Cardiovascular;  Laterality: N/A;    Current Outpatient Prescriptions  Medication Sig Dispense Refill  . aspirin 81 MG tablet Take 81 mg by mouth daily.      Marland Kitchen dofetilide (TIKOSYN) 500 MCG capsule Take 1 capsule (500 mcg total) by mouth every 12 (twelve) hours.  60 capsule  4  . furosemide (LASIX) 20 MG tablet TAKE 1 TABLET (20 MG TOTAL) BY MOUTH DAILY.  90 tablet  1  . KLOR-CON 10 10 MEQ tablet Take 1 tablet by mouth daily.      Marland Kitchen lisinopril (PRINIVIL,ZESTRIL) 40 MG tablet Take 40 mg by mouth every morning.      . metoprolol succinate (TOPROL-XL) 50 MG 24 hr tablet Take 1 tablet (50 mg total)  by mouth daily.  30 tablet  6  . rivaroxaban (XARELTO) 20 MG TABS tablet Take 1 tablet (20 mg total) by mouth daily.  30 tablet  6   No current facility-administered medications for this visit.    Allergies  Allergen Reactions  . Oxycodone Rash    History   Social History  . Marital Status: Married    Spouse Name: N/A    Number of Children: N/A  . Years of Education: N/A   Occupational History  . Not on file.   Social History Main Topics  . Smoking status: Never Smoker   . Smokeless tobacco: Never Used  . Alcohol Use: No  . Drug Use: No  . Sexual Activity: Yes   Other Topics Concern  . Not on file    Social History Narrative   Pt lives in [place] with spouse.  Leadership training and behavioral safety training.    Family History  Problem Relation Age of Onset  . Other      mother had a pacemaker    ROS- All systems are reviewed and negative except as per the HPI above  Physical Exam: BP 152/70 recheck 160/64. HR 53 and regular. GEN- The patient is morbidly obese appearing, alert and oriented x 3 today.   Head- normocephalic, atraumatic Eyes-  Sclera clear, conjunctiva pink Ears- hearing intact Oropharynx- clear Neck- supple,  Lungs- Clear to ausculation bilaterally, normal work of breathing Heart- irregular rate and rhythm, no murmurs, rubs or gallops, PMI not laterally displaced GI- soft, NT, ND, + BS Extremities- no clubbing, cyanosis, or edema MS- no significant deformity or atrophy Skin- no rash or lesion Psych- euthymic mood, full affect Neuro- strength and sensation are intact  EKG today reveals sinus rhythm, QTc 463 Echo is reviewed, LA size 44mm Dr Rachel Bo notes and epic records are reviewed  Assessment and Plan:  1. Persistent afib He is presently doing much better with sinus rhythm. Continue current tikosyn dosing He will  need repeat BMET, Mg, and ekg today and again in three months.  2. Hypertensive cardiovascular disease Salt restriction and weight loss are advised Continue with lisinopril 40 mg a day and lasix 20 mg daily. Start amlodipine 5mg , start with 1/2 tablet a day and increase to obtain systolic bp less than 161. Report any significant pedal edema.  3. Severe LA enlargement Noted, suggests that his afib is likely longstanding and will be very difficult to manage with rhythm control.  Ultimately he may require a rate control strategy vs an aggressive rhythm approach with MAZE.  4. OSA Compliance with CPAP is advised.  Recheck in 3 months.

## 2014-04-03 NOTE — Patient Instructions (Signed)
Your physician recommends that you schedule a follow-up appointment in: 3 months with Roderic Palau, NP  Your physician has recommended you make the following change in your medication:  1) Stop Aspirin 2) Start Amlodipine 5mg  daily   Your physician recommends that you return for lab work today: [place]

## 2014-04-05 LAB — BASIC METABOLIC PANEL
BUN: 22 mg/dL (ref 6–23)
CHLORIDE: 103 meq/L (ref 96–112)
CO2: 27 mEq/L (ref 19–32)
Calcium: 9.4 mg/dL (ref 8.4–10.5)
Creatinine, Ser: 0.9 mg/dL (ref 0.4–1.5)
GFR: 89.92 mL/min (ref >60.00)
Glucose, Bld: 81 mg/dL (ref 70–99)
POTASSIUM: 4 meq/L (ref 3.5–5.1)
SODIUM: 138 meq/L (ref 135–145)

## 2014-05-17 ENCOUNTER — Encounter: Payer: Self-pay | Admitting: Podiatry

## 2014-05-17 ENCOUNTER — Ambulatory Visit (INDEPENDENT_AMBULATORY_CARE_PROVIDER_SITE_OTHER): Payer: BC Managed Care – PPO | Admitting: Podiatry

## 2014-05-17 ENCOUNTER — Ambulatory Visit (INDEPENDENT_AMBULATORY_CARE_PROVIDER_SITE_OTHER): Payer: BC Managed Care – PPO

## 2014-05-17 VITALS — BP 153/84 | HR 62 | Resp 12

## 2014-05-17 DIAGNOSIS — R52 Pain, unspecified: Secondary | ICD-10-CM

## 2014-05-17 DIAGNOSIS — M722 Plantar fascial fibromatosis: Secondary | ICD-10-CM

## 2014-05-17 NOTE — Progress Notes (Signed)
   Subjective:    Patient ID: [name], male    DOB: [birth year], 63 y.o.   MRN: [id number]  HPI  N-SORE L-B/L HEEL, ARCH D-4 DAYS O-SLOWLY C-SAME A-WALKING, PRESSURE T-STRECTHING  Patient presents today complaining of extreme pain in the plantar left heel that is causing him to limp when walking and standing. 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 He said several months ago he had episode of heel pain that seemed to resolve after several days, however, this episode is far more painful. 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 Review of Systems  HENT: Positive for hearing loss.        Objective:   Physical Exam  Orientated 3  Vascular: DP pulses 2/4 bilaterally PT pulses 2/4 bilaterally  Neurological: Ankle reflex equal and reactive bilaterally  Dermatological: Texture and turgor within normal limits  Musculoskeletal: Pes planus bilaterally Hammertoe deformity second bilaterally Painful gait pattern favoring left foot Palpable tenderness in the plantar lateral fascial band left without any palpable lesions which duplicates area of primary pain Palpable tenderness mid plantar fascial and insertional area right without any palpable lesions  X-ray examination left foot  Intact bony structure without fracture and/or dislocation Well-organized posterior and inferior calcaneal spurs Hammertoe deformity second toe  Radiographic impression: No acute bony abnormality noted left foot Posterior and inferior calcaneal spurs       X-ray examination right foot  Intact bony structure without a fracture and/or dislocation Well-organized inferior calcaneal spur Hammertoe deformity second toe  Radiographic impression: No acute bony abnormality noted in the right foot Inferior calcaneal spur        Assessment & Plan:    Assessment: Plantar fasciitis left more symptomatic than right  Plan: Offered patient Kenalog injection inferior left and he verbally consents. Skin is prepped with alcohol and Betadine and 10 mg of plain Xylocaine mixed with 2.5 mg of plain Marcaine and 10 mg of Kenalog were injected inferior heel left for Kenalog injection #1  Fascial taping applied left  After removal fascial taping stretching exercise prescribed and shoeing recommendations discussed  Reappoint at patient's request

## 2014-05-17 NOTE — Patient Instructions (Signed)
Plantar Fasciitis Plantar fasciitis is a common condition that causes foot pain. It is soreness (inflammation) of the band of tough fibrous tissue on the bottom of the foot that runs from the heel bone (calcaneus) to the ball of the foot. The cause of this soreness may be from excessive standing, poor fitting shoes, running on hard surfaces, being overweight, having an abnormal walk, or overuse (this is common in runners) of the painful foot or feet. It is also common in aerobic exercise dancers and ballet dancers. SYMPTOMS  Most people with plantar fasciitis complain of:  Severe pain in the morning on the bottom of their foot especially when taking the first steps out of bed. This pain recedes after a few minutes of walking.  Severe pain is experienced also during walking following a long period of inactivity.  Pain is worse when walking barefoot or up stairs DIAGNOSIS   Your caregiver will diagnose this condition by examining and feeling your foot.  Special tests such as X-rays of your foot, are usually not needed. PREVENTION   Consult a sports medicine professional before beginning a new exercise program.  Walking programs offer a good workout. With walking there is a lower chance of overuse injuries common to runners. There is less impact and less jarring of the joints.  Begin all new exercise programs slowly. If problems or pain develop, decrease the amount of time or distance until you are at a comfortable level.  Wear good shoes and replace them regularly.  Stretch your foot and the heel cords at the back of the ankle (Achilles tendon) both before and after exercise.  Run or exercise on even surfaces that are not hard. For example, asphalt is better than pavement.  Do not run barefoot on hard surfaces.  If using a treadmill, vary the incline.  Do not continue to workout if you have foot or joint problems. Seek professional help if they do not  improve. HOME CARE INSTRUCTIONS   Avoid activities that cause you pain until you recover.  Use ice or cold packs on the problem or painful areas after working out.  Only take over-the-counter or prescription medicines for pain, discomfort, or fever as directed by your caregiver.  Soft shoe inserts or athletic shoes with air or gel sole cushions may be helpful.  If problems continue or become more severe, consult a sports medicine caregiver or your own health care provider. Cortisone is a potent anti-inflammatory medication that may be injected into the painful area. You can discuss this treatment with your caregiver. MAKE SURE YOU:   Understand these instructions.  Will watch your condition.  Will get help right away if you are not doing well or get worse. Document Released: 03/25/2001 Document Revised: 09/22/2011 Document Reviewed: 05/24/2008 [employer] Patient Information 2015 [place], [place]. This information is not intended to replace advice given to you by your health care provider. Make sure you discuss any questions you have with your health care provider.   Bent - Knee Calf Stretch  1) Stand an arm's length away from a wall. Place the palms of your hands on the wall. Step forward about 12 inches with the opposite foot.  2) Keeping toes pointed forward and both heels on the floor, bend both knees and lean forward. Hold this position for 60 seconds. Don't arch your back and don't hunch your shoulders.  3) Repeat this twice.  DO  THIS STRETCHING TECHNIQUE 3 TIMES A DAY.   Stretching Exercises before Standing  Pull your toes up toward your nose and hold for 1 minute before standing.  A towel can assist with this exercise if you put the towel under the ball of your foot. This exercise reduces the intense   pain associated when changing from a seated to a standing position. This stretch can usually be the most beneficial if done before getting out of bed in the mornings.

## 2014-05-18 ENCOUNTER — Encounter: Payer: Self-pay | Admitting: Podiatry

## 2014-05-18 DIAGNOSIS — M722 Plantar fascial fibromatosis: Secondary | ICD-10-CM

## 2014-05-18 MED ORDER — TRIAMCINOLONE ACETONIDE 10 MG/ML IJ SUSP
10.0000 mg | Freq: Once | INTRAMUSCULAR | Status: AC
  Administered 2014-05-18: 10 mg

## 2014-05-25 ENCOUNTER — Other Ambulatory Visit: Payer: Self-pay | Admitting: Internal Medicine

## 2014-05-31 ENCOUNTER — Other Ambulatory Visit: Payer: Self-pay | Admitting: Internal Medicine

## 2014-07-04 ENCOUNTER — Ambulatory Visit (HOSPITAL_COMMUNITY)
Admission: RE | Admit: 2014-07-04 | Discharge: 2014-07-04 | Disposition: A | Discharge status: Home / Self Care | Payer: BC Managed Care – PPO | Source: Ambulatory Visit | Attending: Nurse Practitioner | Admitting: Nurse Practitioner

## 2014-07-04 ENCOUNTER — Encounter (HOSPITAL_COMMUNITY): Payer: Self-pay | Admitting: Nurse Practitioner

## 2014-07-04 VITALS — BP 210/110 | HR 65 | Resp 18 | Wt 283.5 lb

## 2014-07-04 DIAGNOSIS — I482 Chronic atrial fibrillation, unspecified: Secondary | ICD-10-CM

## 2014-07-04 DIAGNOSIS — Z888 Allergy status to other drugs, medicaments and biological substances status: Secondary | ICD-10-CM | POA: Diagnosis not present

## 2014-07-04 DIAGNOSIS — Z79899 Other long term (current) drug therapy: Secondary | ICD-10-CM | POA: Insufficient documentation

## 2014-07-04 DIAGNOSIS — I4819 Other persistent atrial fibrillation: Secondary | ICD-10-CM

## 2014-07-04 DIAGNOSIS — I38 Endocarditis, valve unspecified: Secondary | ICD-10-CM | POA: Diagnosis not present

## 2014-07-04 DIAGNOSIS — I481 Persistent atrial fibrillation: Secondary | ICD-10-CM | POA: Diagnosis not present

## 2014-07-04 DIAGNOSIS — I119 Hypertensive heart disease without heart failure: Secondary | ICD-10-CM | POA: Insufficient documentation

## 2014-07-04 DIAGNOSIS — G4733 Obstructive sleep apnea (adult) (pediatric): Secondary | ICD-10-CM | POA: Diagnosis not present

## 2014-07-04 DIAGNOSIS — Z7902 Long term (current) use of antithrombotics/antiplatelets: Secondary | ICD-10-CM | POA: Insufficient documentation

## 2014-07-04 LAB — BASIC METABOLIC PANEL
ANION GAP: 9 (ref 5–15)
BUN: 18 mg/dL (ref 6–23)
CALCIUM: 9.3 mg/dL (ref 8.4–10.5)
CO2: 26 mmol/L (ref 19–32)
CREATININE: 0.8 mg/dL (ref 0.50–1.35)
Chloride: 104 mEq/L (ref 96–112)
GFR calc non Af Amer: >90 mL/min (ref >90)
Glucose, Bld: 96 mg/dL (ref 70–99)
Potassium: 4.1 mmol/L (ref 3.5–5.1)
Sodium: 139 mmol/L (ref 135–145)

## 2014-07-04 LAB — MAGNESIUM: Magnesium: 2 mg/dL (ref 1.5–2.5)

## 2014-07-04 NOTE — Patient Instructions (Addendum)
Doing great!  Will call you with abnormal lab results, otherwise no news is good news!  Follow up in 3 months with Dr. Rayann Heman. Scheduler: [phone] Lenna Sciara)  Merry Christmas and Happy New Year!

## 2014-07-04 NOTE — Progress Notes (Signed)
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 male with a h/o persistent atrial fibrillaiton, OSA (uses CPAP), morbid obestiy, and LA enlargement who presents for EP follow-up.  He presents today in sinus rhythm with Tikosyn, which was started in March 2015.  Denies any occurrence of irregular heart beat. Bp elevated today, rechecked 200/100. He states he got aggravated because he could not find the clinic.Also, has had head and chest congestion and is taking Nyquil and Aleve cold and sinus. He was warned that both can aggravated HTN and may trigger afib.  He walks all the time at work but no regular exercise.   Today, he denies symptoms of  presyncope, syncope, chest pain, dyspnea or neurologic sequela. The patient is tolerating medications without difficulties and is otherwise without complaint today.   Past Medical History  Diagnosis Date  . PONV (postoperative nausea and vomiting)     slow to wake up after  . Hypertension   . Rotator cuff tear, right dec 2012    physical therapy done, decreased strength  . Persistent atrial fibrillation     cardioversion 06/06/13  . Obesity   . Atrial enlargement, left   . Hearing aid worn     both ears  . Wears glasses   . OSA on CPAP     "mask adjusts to what I need" (09/26/2013)  . Arthritis     "joints" (09/26/2013)   Past Surgical History  Procedure Laterality Date  . Total hip arthroplasty Left 2010  . Total knee arthroplasty Right 2006   . Knee arthroscopy Right 1990's  . Shoulder open rotator cuff repair Left 1990's  . Total hip revision Left 10/08/2011  . Total hip revision  04/09/2012    Procedure: TOTAL HIP REVISION;  Surgeon: Gearlean Alf, MD;  Location: [facility];  Service: Orthopedics;  Laterality: Left;  Left Hip Acetabular Revision vs Constrained Liner  . Cardioversion N/A 06/06/2013    Procedure: CARDIOVERSION;  Surgeon: Sanda Klein, MD;  Location: [facility]  ENDOSCOPY;  Service: Cardiovascular;  Laterality: N/A;  . Carpal tunnel release Right 08/08/2013    Procedure: RIGHT CARPAL TUNNEL RELEASE;  Surgeon: Wynonia Sours, MD;  Location: [place];  Service: Orthopedics;  Laterality: Right;  . Trigger finger release Right 08/08/2013    Procedure: RELEASE STENOSING TENOSYNOVITIS RIGHT THUMB;  Surgeon: Wynonia Sours, MD;  Location: [place];  Service: Orthopedics;  Laterality: Right;  . Tonsillectomy  1950's  . Shoulder open rotator cuff repair Right 11/2007  . Carpal tunnel release Left 2008  . Cardioversion N/A 09/28/2013    Procedure: CARDIOVERSION BEDSIDE;  Surgeon: Peter M Martinique, MD;  Location: [facility] OR;  Service: Cardiovascular;  Laterality: N/A;    Current Outpatient Prescriptions  Medication Sig Dispense Refill  . amLODipine (NORVASC) 5 MG tablet Take 1 tablet (5 mg total) by mouth daily. 180 tablet 3  . dofetilide (TIKOSYN) 500 MCG capsule Take 1 capsule (500 mcg total) by mouth every 12 (twelve) hours. 60 capsule 4  . furosemide (LASIX) 20 MG tablet TAKE 1 TABLET BY MOUTH EVERY DAY 90 tablet 1  . KLOR-CON M10 10 MEQ tablet TAKE 1 TABLET BY MOUTH EVERY DAY AS DIRECTED 90 tablet 1  . lisinopril (PRINIVIL,ZESTRIL) 40 MG tablet Take 40 mg by mouth every morning.    . metoprolol succinate (TOPROL-XL) 50 MG 24 hr tablet Take 1 tablet (50 mg  total) by mouth daily. 30 tablet 6  . rivaroxaban (XARELTO) 20 MG TABS tablet Take 1 tablet (20 mg total) by mouth daily. 30 tablet 6   No current facility-administered medications for this encounter.    Allergies  Allergen Reactions  . Oxycodone Rash    Can tolerate Hydrocodone    History   Social History  . Marital Status: Married    Spouse Name: N/A    Number of Children: N/A  . Years of Education: N/A   Occupational History  . Not on file.   Social History Main Topics  . Smoking status: Never Smoker   . Smokeless tobacco: Never Used  . Alcohol Use: No  . Drug  Use: No  . Sexual Activity: Yes   Other Topics Concern  . Not on file   Social History Narrative   Pt lives in [place] with spouse.  Leadership training and behavioral safety training.    Family History  Problem Relation Age of Onset  . Other      mother had a pacemaker    Ros- per HPI, otherwise negative.  Physical Exam:  GEN- The patient is morbidly obese appearing, alert and oriented x 3 today.   Head- normocephalic, atraumatic Eyes-  Sclera clear, conjunctiva pink Ears- hearing intact Oropharynx- clear Neck- supple,  Lungs- Clear to ausculation bilaterally, normal work of breathing Heart- irregular rate and rhythm, no murmurs, rubs or gallops, PMI not laterally displaced GI- soft, NT, ND, + BS Extremities- no clubbing, cyanosis, or edema MS- no significant deformity or atrophy Skin- no rash or lesion Psych- euthymic mood, full affect Neuro- strength and sensation are intact  EKG today reveals sinus rhythm at 62 bpm, QTc 410ms.  Assessment and Plan:  1. Persistent afib Maintaining NSR on tikosyn, QTc acceptable.. Bmet, magnesium today.  2. Hypertensive cardiovascular disease Salt restriction,exercise and weight loss are advised Continue with lisinopril 40 mg a day and lasix 20 mg, Amlodipine 5mg  Encouraged to get a BP cuff and follow readings at home. Goal would be less than 503 systolic. If continues to be elevated, see PCP for adjustment of meds. Pt believes today, combination of not being able to find clinic/white coat syndrome. Cautioned to  avoid OTC decongestants and antiinflammatories.  3. Severe LA enlargement Currently doing well to maintain NSR with Tikosyn.  4. OSA Compliance with CPAP is advised.  Recheck in 3 months with Dr. Rayann Heman.

## 2014-07-05 ENCOUNTER — Other Ambulatory Visit: Payer: Self-pay | Admitting: Cardiovascular Disease

## 2014-07-05 NOTE — Telephone Encounter (Signed)
Rx(s) sent to pharmacy electronically.  

## 2014-07-05 NOTE — Addendum Note (Signed)
Encounter addended by: Asencion Gowda, CCT on: 07/05/2014  9:41 AM<BR>     Documentation filed: Charges VN

## 2014-07-06 ENCOUNTER — Telehealth (HOSPITAL_COMMUNITY): Payer: Self-pay | Admitting: Nurse Practitioner

## 2014-07-06 NOTE — Telephone Encounter (Signed)
Notified of normal lab results.

## 2014-07-10 ENCOUNTER — Telehealth: Payer: Self-pay | Admitting: Internal Medicine

## 2014-07-10 NOTE — Telephone Encounter (Signed)
Received request from Nurse fax box, documents faxed for surgical clearance. [id number] Fax number: [phone] Attention: 12.28.15/km

## 2014-07-11 ENCOUNTER — Telehealth: Payer: Self-pay | Admitting: Internal Medicine

## 2014-07-11 NOTE — Telephone Encounter (Signed)
New message     Did we get the fax to hold his xarelto prior to colonoscopy.  Procedure is scheduled for tomorrow.  Please call

## 2014-07-11 NOTE — Telephone Encounter (Signed)
This was faxed on 07/10/14 and Maudie Mercury will re fax again

## 2014-07-12 ENCOUNTER — Other Ambulatory Visit: Payer: Self-pay | Admitting: Gastroenterology

## 2014-07-17 ENCOUNTER — Other Ambulatory Visit: Payer: Self-pay | Admitting: Internal Medicine

## 2014-08-14 ENCOUNTER — Other Ambulatory Visit: Payer: Self-pay | Admitting: Cardiovascular Disease

## 2014-08-14 NOTE — Telephone Encounter (Signed)
Rx(s) sent to pharmacy electronically.  

## 2014-08-25 ENCOUNTER — Other Ambulatory Visit: Payer: Self-pay | Admitting: Internal Medicine

## 2014-09-04 ENCOUNTER — Other Ambulatory Visit: Payer: Self-pay

## 2014-09-04 MED ORDER — POTASSIUM CHLORIDE CRYS ER 10 MEQ PO TBCR: 10.0000 meq | EXTENDED_RELEASE_TABLET | Freq: Every day | ORAL | Status: DC

## 2014-09-04 MED ORDER — FUROSEMIDE 20 MG PO TABS: 20.0000 mg | ORAL_TABLET | Freq: Every day | ORAL | Status: DC

## 2014-10-03 ENCOUNTER — Other Ambulatory Visit: Payer: Self-pay | Admitting: Cardiovascular Disease

## 2014-11-22 ENCOUNTER — Other Ambulatory Visit: Payer: Self-pay | Admitting: Internal Medicine

## 2014-11-27 ENCOUNTER — Ambulatory Visit: Payer: Self-pay | Admitting: Internal Medicine

## 2014-12-06 ENCOUNTER — Other Ambulatory Visit: Payer: Self-pay | Admitting: Internal Medicine

## 2015-01-18 ENCOUNTER — Ambulatory Visit (INDEPENDENT_AMBULATORY_CARE_PROVIDER_SITE_OTHER): Payer: Self-pay | Admitting: Internal Medicine

## 2015-01-18 ENCOUNTER — Encounter: Payer: Self-pay | Admitting: Internal Medicine

## 2015-01-18 VITALS — BP 128/84 | HR 62 | Ht 69.0 in | Wt 282.2 lb

## 2015-01-18 DIAGNOSIS — I482 Chronic atrial fibrillation, unspecified: Secondary | ICD-10-CM

## 2015-01-18 DIAGNOSIS — I119 Hypertensive heart disease without heart failure: Secondary | ICD-10-CM

## 2015-01-18 DIAGNOSIS — G4733 Obstructive sleep apnea (adult) (pediatric): Secondary | ICD-10-CM

## 2015-01-18 DIAGNOSIS — I517 Cardiomegaly: Secondary | ICD-10-CM

## 2015-01-18 LAB — BASIC METABOLIC PANEL
BUN: 23 mg/dL (ref 6–23)
CHLORIDE: 104 meq/L (ref 96–112)
CO2: 30 meq/L (ref 19–32)
CREATININE: 1.02 mg/dL (ref 0.40–1.50)
Calcium: 9.4 mg/dL (ref 8.4–10.5)
GFR: 78.63 mL/min (ref >60.00)
Glucose, Bld: 83 mg/dL (ref 70–99)
Potassium: 3.8 mEq/L (ref 3.5–5.1)
Sodium: 142 mEq/L (ref 135–145)

## 2015-01-18 LAB — MAGNESIUM: Magnesium: 2 mg/dL (ref 1.5–2.5)

## 2015-01-18 MED ORDER — DOFETILIDE 500 MCG PO CAPS
ORAL_CAPSULE | ORAL | Status: DC
Start: 2015-01-18 — End: 2015-07-21

## 2015-01-18 MED ORDER — FUROSEMIDE 20 MG PO TABS: 20.0000 mg | ORAL_TABLET | Freq: Every day | ORAL | Status: DC

## 2015-01-18 NOTE — Patient Instructions (Signed)
Medication Instructions: - no changes  Labwork: - Your physician recommends that you have lab work today: BMP/ Magnesium  Procedures/Testing: - none  Follow-Up: - Your physician recommends that you schedule a follow-up appointment in: 3 months with Roderic Palau, NP in the A-fib clinic.  Any Additional Special Instructions Will Be Listed Below (If Applicable).

## 2015-01-21 NOTE — Progress Notes (Signed)
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   He is maintaining sinus rhythm with tikosyn. He feels good. Today, he denies symptoms of  presyncope, syncope, or neurologic sequela. The patient is tolerating medications without difficulties and is otherwise without complaint today.   Past Medical History  Diagnosis Date  . PONV (postoperative nausea and vomiting)     slow to wake up after  . Hypertension   . Rotator cuff tear, right dec 2012    physical therapy done, decreased strength  . Persistent atrial fibrillation     cardioversion 06/06/13  . Obesity   . Atrial enlargement, left   . Hearing aid worn     both ears  . Wears glasses   . OSA on CPAP     "mask adjusts to what I need" (09/26/2013)  . Arthritis     "joints" (09/26/2013)   Past Surgical History  Procedure Laterality Date  . Total hip arthroplasty Left 2010  . Total knee arthroplasty Right 2006   . Knee arthroscopy Right 1990's  . Shoulder open rotator cuff repair Left 1990's  . Total hip revision Left 10/08/2011  . Total hip revision  04/09/2012    Procedure: TOTAL HIP REVISION;  Surgeon: Gearlean Alf, MD;  Location: [facility];  Service: Orthopedics;  Laterality: Left;  Left Hip Acetabular Revision vs Constrained Liner  . Cardioversion N/A 06/06/2013    Procedure: CARDIOVERSION;  Surgeon: Sanda Klein, MD;  Location: [facility] ENDOSCOPY;  Service: Cardiovascular;  Laterality: N/A;  . Carpal tunnel release Right 08/08/2013    Procedure: RIGHT CARPAL TUNNEL RELEASE;  Surgeon: Wynonia Sours, MD;  Location: [place];  Service: Orthopedics;  Laterality: Right;  . Trigger finger release Right 08/08/2013    Procedure: RELEASE STENOSING TENOSYNOVITIS RIGHT THUMB;  Surgeon: Wynonia Sours, MD;  Location: [place];  Service: Orthopedics;  Laterality: Right;  . Tonsillectomy  1950's  . Shoulder open rotator cuff repair Right 11/2007  . Carpal tunnel release Left 2008  . Cardioversion N/A 09/28/2013    Procedure: CARDIOVERSION BEDSIDE;  Surgeon: Peter M Martinique, MD;  Location: [facility] OR;  Service: Cardiovascular;  Laterality: N/A;    Current Outpatient Prescriptions  Medication Sig Dispense Refill  . amLODipine (NORVASC) 5 MG tablet Take 5 mg by mouth 2 (two) times daily.    Marland Kitchen dofetilide (TIKOSYN) 500 MCG capsule TAKE ONE CAPSULE BY MOUTH EVERY 12 HOURS 180 capsule 1  . furosemide (LASIX) 20 MG tablet Take 1 tablet (20 mg total) by mouth daily. 90 tablet 1  . lisinopril (PRINIVIL,ZESTRIL) 40 MG tablet Take 40 mg by mouth every morning.    . metoprolol succinate (TOPROL-XL) 50 MG 24 hr tablet TAKE 1 TABLET (50 MG TOTAL) BY MOUTH DAILY. 30 tablet 1  . potassium chloride (KLOR-CON M10) 10 MEQ tablet Take 1 tablet (10 mEq total) by mouth daily. 90 tablet 0  . Probiotic Product (PROBIOTIC PO) Take 1 capsule by mouth daily.    [name] 20 MG TABS tablet TAKE 1 TABLET (20 MG TOTAL) BY MOUTH DAILY. 30 tablet 6   No current facility-administered medications for this visit.    Allergies  Allergen Reactions  . Oxycodone Rash    Can tolerate Hydrocodone    History   Social History  .  Marital Status: Married    Spouse Name: N/A  . Number of Children: N/A  . Years of Education: N/A   Occupational History  . Not on file.   Social History Main Topics  . Smoking status: Never Smoker   . Smokeless tobacco: Never Used  . Alcohol Use: No  . Drug Use: No  . Sexual Activity: Yes   Other Topics Concern  . Not on file   Social History Narrative   Pt lives in [place] with spouse.  Leadership training and behavioral safety training.    Family History  Problem Relation Age of Onset  . Other      mother had a pacemaker    ROS- All systems are reviewed and negative except as per the HPI  above  Physical Exam:  Filed Vitals:   01/18/15 1222  BP: 128/84  Pulse: 62   GEN- The patient is morbidly obese appearing, alert and oriented x 3 today.   Head- normocephalic, atraumatic Eyes-  Sclera clear, conjunctiva pink Ears- hearing intact Oropharynx- clear Neck- supple,  Lungs- Clear to ausculation bilaterally, normal work of breathing Heart- regular rate and rhythm, no murmurs, rubs or gallops, PMI not laterally displaced GI- soft, NT, ND, + BS Extremities- no clubbing, cyanosis, or edema MS- no significant deformity or atrophy Skin- no rash or lesion Psych- euthymic mood, full affect Neuro- strength and sensation are intact  EKG today reveals sinus rhythm, stable qtc  Assessment and Plan:  1. Persistent afib He is doing much better with sinus rhythm. Continue current tikosyn dosing He will need repeat BMET, Mg, and ekg today. Follow-up with Roderic Palau NP In the AF clinic every 3 months.  I will see when needed going forward.  2. Hypertensive cardiovascular disease Salt restriction and weight loss are advised  3. Severe LA enlargement Noted, suggests that his afib is likely longstanding and will be very difficult to manage with rhythm control.  Ultimately he may require a rate control strategy vs an aggressive rhythm approach with MAZE.  4. OSA Compliance with CPAP is advised.  Follow-up with Dr Gwenlyn Found as scheduled Roderic Palau NP to see every 3 months in the AF clinic I will see when needed going forward.

## 2015-04-17 ENCOUNTER — Ambulatory Visit (HOSPITAL_COMMUNITY)
Admission: RE | Admit: 2015-04-17 | Discharge: 2015-04-17 | Disposition: A | Discharge status: Home / Self Care | Payer: Self-pay | Source: Ambulatory Visit | Attending: Nurse Practitioner | Admitting: Nurse Practitioner

## 2015-04-17 ENCOUNTER — Encounter (HOSPITAL_COMMUNITY): Payer: Self-pay | Admitting: Nurse Practitioner

## 2015-04-17 VITALS — BP 150/74 | HR 56 | Ht 70.0 in | Wt 281.2 lb

## 2015-04-17 DIAGNOSIS — I4819 Other persistent atrial fibrillation: Secondary | ICD-10-CM

## 2015-04-17 DIAGNOSIS — G4733 Obstructive sleep apnea (adult) (pediatric): Secondary | ICD-10-CM | POA: Insufficient documentation

## 2015-04-17 DIAGNOSIS — I517 Cardiomegaly: Secondary | ICD-10-CM | POA: Insufficient documentation

## 2015-04-17 DIAGNOSIS — Z7902 Long term (current) use of antithrombotics/antiplatelets: Secondary | ICD-10-CM | POA: Insufficient documentation

## 2015-04-17 DIAGNOSIS — Z6841 Body Mass Index (BMI) 40.0 and over, adult: Secondary | ICD-10-CM | POA: Insufficient documentation

## 2015-04-17 DIAGNOSIS — I10 Essential (primary) hypertension: Secondary | ICD-10-CM | POA: Insufficient documentation

## 2015-04-17 DIAGNOSIS — I481 Persistent atrial fibrillation: Secondary | ICD-10-CM | POA: Insufficient documentation

## 2015-04-17 DIAGNOSIS — Z885 Allergy status to narcotic agent status: Secondary | ICD-10-CM | POA: Insufficient documentation

## 2015-04-17 DIAGNOSIS — Z79899 Other long term (current) drug therapy: Secondary | ICD-10-CM | POA: Insufficient documentation

## 2015-04-17 LAB — BASIC METABOLIC PANEL
Anion gap: 9 (ref 5–15)
BUN: 17 mg/dL (ref 6–20)
CHLORIDE: 102 mmol/L (ref 101–111)
CO2: 29 mmol/L (ref 22–32)
CREATININE: 0.89 mg/dL (ref 0.61–1.24)
Calcium: 9.5 mg/dL (ref 8.9–10.3)
GFR calc Af Amer: >60 mL/min (ref >60)
GFR calc non Af Amer: >60 mL/min (ref >60)
Glucose, Bld: 79 mg/dL (ref 65–99)
POTASSIUM: 4.2 mmol/L (ref 3.5–5.1)
SODIUM: 140 mmol/L (ref 135–145)

## 2015-04-17 LAB — MAGNESIUM: MAGNESIUM: 2.2 mg/dL (ref 1.7–2.4)

## 2015-04-17 MED ORDER — FUROSEMIDE 20 MG PO TABS: 20.0000 mg | ORAL_TABLET | Freq: Every day | ORAL | Status: DC

## 2015-04-17 NOTE — Patient Instructions (Signed)
Call to schedule appointment for January for follow up. [phone]

## 2015-04-17 NOTE — Progress Notes (Signed)
Patient ID: [name], male   DOB: 1951-07-12, 64 y.o.   MRN: [id number]     Primary Care Physician: Kandice Hams, MD Referring Physician: Dr. Lucillie Garfinkel is a 64 y.o. male with a h/o OSA, morbid obesity, LA enlargement, persistent afib for f/u. He is maintaining SR on tikosyn.  He will have 2-3 days of afib every 3-4 months. He is ok with that afib burden. He still feels that Phyllis Ginger is doing the job for him. He is using cpap. We discussed weight management and regular exercise program.  Today, he denies symptoms of palpitations, chest pain, shortness of breath, orthopnea, PND, lower extremity edema, dizziness, presyncope, syncope, or neurologic sequela. The patient is tolerating medications without difficulties and is otherwise without complaint today.   Past Medical History  Diagnosis Date  . PONV (postoperative nausea and vomiting)     slow to wake up after  . Hypertension   . Rotator cuff tear, right dec 2012    physical therapy done, decreased strength  . Persistent atrial fibrillation ([place])     cardioversion 06/06/13  . Obesity   . Atrial enlargement, left   . Hearing aid worn     both ears  . Wears glasses   . OSA on CPAP     "mask adjusts to what I need" (09/26/2013)  . Arthritis     "joints" (09/26/2013)   Past Surgical History  Procedure Laterality Date  . Total hip arthroplasty Left 2010  . Total knee arthroplasty Right 2006   . Knee arthroscopy Right 1990's  . Shoulder open rotator cuff repair Left 1990's  . Total hip revision Left 10/08/2011  . Total hip revision  04/09/2012    Procedure: TOTAL HIP REVISION;  Surgeon: Gearlean Alf, MD;  Location: [facility];  Service: Orthopedics;  Laterality: Left;  Left Hip Acetabular Revision vs Constrained Liner  . Cardioversion N/A 06/06/2013    Procedure: CARDIOVERSION;  Surgeon: Sanda Klein, MD;  Location: [facility] ENDOSCOPY;  Service: Cardiovascular;  Laterality: N/A;  . Carpal tunnel release Right  08/08/2013    Procedure: RIGHT CARPAL TUNNEL RELEASE;  Surgeon: Wynonia Sours, MD;  Location: [place];  Service: Orthopedics;  Laterality: Right;  . Trigger finger release Right 08/08/2013    Procedure: RELEASE STENOSING TENOSYNOVITIS RIGHT THUMB;  Surgeon: Wynonia Sours, MD;  Location: [place];  Service: Orthopedics;  Laterality: Right;  . Tonsillectomy  1950's  . Shoulder open rotator cuff repair Right 11/2007  . Carpal tunnel release Left 2008  . Cardioversion N/A 09/28/2013    Procedure: CARDIOVERSION BEDSIDE;  Surgeon: Peter M Martinique, MD;  Location: [facility] OR;  Service: Cardiovascular;  Laterality: N/A;    Current Outpatient Prescriptions  Medication Sig Dispense Refill  . amLODipine (NORVASC) 5 MG tablet Take 5 mg by mouth 2 (two) times daily.    Marland Kitchen dofetilide (TIKOSYN) 500 MCG capsule TAKE ONE CAPSULE BY MOUTH EVERY 12 HOURS 180 capsule 1  . furosemide (LASIX) 20 MG tablet Take 1 tablet (20 mg total) by mouth daily. 90 tablet 90  . lisinopril (PRINIVIL,ZESTRIL) 40 MG tablet Take 40 mg by mouth every morning.    . metoprolol succinate (TOPROL-XL) 50 MG 24 hr tablet TAKE 1 TABLET (50 MG TOTAL) BY MOUTH DAILY. 30 tablet 1  . potassium chloride (KLOR-CON M10) 10 MEQ tablet Take 1 tablet (10 mEq total) by mouth daily. 90 tablet 0  . Probiotic Product (PROBIOTIC PO) Take 1  capsule by mouth daily.    [name] 20 MG TABS tablet TAKE 1 TABLET (20 MG TOTAL) BY MOUTH DAILY. 30 tablet 6   No current facility-administered medications for this encounter.    Allergies  Allergen Reactions  . Oxycodone Rash    Can tolerate Hydrocodone    Social History   Social History  . Marital Status: Married    Spouse Name: N/A  . Number of Children: N/A  . Years of Education: N/A   Occupational History  . Not on file.   Social History Main Topics  . Smoking status: Never Smoker   . Smokeless tobacco: Never Used  . Alcohol Use: No  . Drug Use: No  . Sexual Activity:  Yes   Other Topics Concern  . Not on file   Social History Narrative   Pt lives in [place] with spouse.  Leadership training and behavioral safety training.    Family History  Problem Relation Age of Onset  . Other      mother had a pacemaker    ROS- All systems are reviewed and negative except as per the HPI above  Physical Exam: Filed Vitals:   04/17/15 1019  BP: 150/74  Pulse: 56  Height: 5\' 10"  (1.778 m)  Weight: 281 lb 3.2 oz (127.551 kg)    GEN- The patient is well appearing, alert and oriented x 3 today.   Head- normocephalic, atraumatic Eyes-  Sclera clear, conjunctiva pink Ears- hearing intact Oropharynx- clear Neck- supple, no JVP Lymph- no cervical lymphadenopathy Lungs- Clear to ausculation bilaterally, normal work of breathing Heart- Regular rate and rhythm, no murmurs, rubs or gallops, PMI not laterally displaced GI- soft, NT, ND, + BS Extremities- no clubbing, cyanosis, or edema MS- no significant deformity or atrophy Skin- no rash or lesion Psych- euthymic mood, full affect Neuro- strength and sensation are intact  EKG- Sinus brady, 56 bpm, septal infarct age undetermined. Pr int 184 ms, Qrs int 98 ms, QTc 457 ms. Epic records reviwed  Assessment and Plan: 1. Persistent afib For most part staying in SR Continue tiksoyn Bmet/mag  2. Hypertensive CV disease Restrict salt/ weight loss  3. Severe LA enlargement So far staying in SR with tikosyn Ultimately, he may require a rate control strategy vrs aggressive rhythm approach with Maze  F/u in 3 months in afib clinic   Butch Penny C. Deby Adger, [facility] [address] [place], [place] [address] [phone]

## 2015-04-19 ENCOUNTER — Inpatient Hospital Stay (HOSPITAL_COMMUNITY): Admission: RE | Admit: 2015-04-19 | Payer: Self-pay | Source: Ambulatory Visit | Admitting: Nurse Practitioner

## 2015-04-20 ENCOUNTER — Ambulatory Visit (HOSPITAL_COMMUNITY): Payer: Self-pay | Admitting: Nurse Practitioner

## 2015-06-29 ENCOUNTER — Other Ambulatory Visit: Payer: Self-pay | Admitting: Internal Medicine

## 2015-07-21 ENCOUNTER — Other Ambulatory Visit: Payer: Self-pay | Admitting: Internal Medicine

## 2015-08-13 ENCOUNTER — Encounter (HOSPITAL_COMMUNITY): Payer: Self-pay | Admitting: Nurse Practitioner

## 2015-08-13 ENCOUNTER — Ambulatory Visit (HOSPITAL_COMMUNITY)
Admission: RE | Admit: 2015-08-13 | Discharge: 2015-08-13 | Disposition: A | Discharge status: Home / Self Care | Payer: Self-pay | Source: Ambulatory Visit | Attending: Nurse Practitioner | Admitting: Nurse Practitioner

## 2015-08-13 VITALS — BP 138/84 | HR 60 | Ht 70.0 in | Wt 286.2 lb

## 2015-08-13 DIAGNOSIS — Z7902 Long term (current) use of antithrombotics/antiplatelets: Secondary | ICD-10-CM | POA: Insufficient documentation

## 2015-08-13 DIAGNOSIS — Z79899 Other long term (current) drug therapy: Secondary | ICD-10-CM | POA: Insufficient documentation

## 2015-08-13 DIAGNOSIS — Z885 Allergy status to narcotic agent status: Secondary | ICD-10-CM | POA: Insufficient documentation

## 2015-08-13 DIAGNOSIS — I481 Persistent atrial fibrillation: Secondary | ICD-10-CM | POA: Insufficient documentation

## 2015-08-13 DIAGNOSIS — I119 Hypertensive heart disease without heart failure: Secondary | ICD-10-CM | POA: Insufficient documentation

## 2015-08-13 DIAGNOSIS — G4733 Obstructive sleep apnea (adult) (pediatric): Secondary | ICD-10-CM | POA: Insufficient documentation

## 2015-08-13 DIAGNOSIS — I4819 Other persistent atrial fibrillation: Secondary | ICD-10-CM

## 2015-08-13 LAB — BASIC METABOLIC PANEL
ANION GAP: 9 (ref 5–15)
BUN: 18 mg/dL (ref 6–20)
CO2: 29 mmol/L (ref 22–32)
Calcium: 9.4 mg/dL (ref 8.9–10.3)
Chloride: 104 mmol/L (ref 101–111)
Creatinine, Ser: 0.81 mg/dL (ref 0.61–1.24)
GFR calc Af Amer: >60 mL/min (ref >60)
GLUCOSE: 104 mg/dL — AB (ref 65–99)
Potassium: 3.8 mmol/L (ref 3.5–5.1)
Sodium: 142 mmol/L (ref 135–145)

## 2015-08-13 LAB — MAGNESIUM: Magnesium: 2 mg/dL (ref 1.7–2.4)

## 2015-08-13 NOTE — Progress Notes (Signed)
Patient ID: [name], male   DOB: 11-06-1950, 65 y.o.   MRN: [id number]     Primary Care Physician: Kandice Hams, MD Referring Physician: Dr. Lucillie Garfinkel is a 65 y.o. male with a h/o OSA, morbid obesity, severe LA enlargement (55 mm), persistent afib for f/u. He is maintaining SR on tikosyn, for the most part, but over Christmas, he had 3-4 episodes that lasted 2-5 days.  He is ok with that afib burden. It is usually rate controlled and he is able to function. He still feels that Phyllis Ginger is doing the job for him. Discussed with severe left atrial enlargement, that if he fails tikosyn, amiodarone or maze would be his other options when he was evaluated by Dr. Rayann Heman. He would not be an ablation candidate due to left atrial size. He is using cpap.  BP is well controlled on amlodipine qd.  Today, he denies symptoms of palpitations, chest pain, shortness of breath, orthopnea, PND, lower extremity edema, dizziness, presyncope, syncope, or neurologic sequela. The patient is tolerating medications without difficulties and is otherwise without complaint today.   Past Medical History  Diagnosis Date  . PONV (postoperative nausea and vomiting)     slow to wake up after  . Hypertension   . Rotator cuff tear, right dec 2012    physical therapy done, decreased strength  . Persistent atrial fibrillation ([place])     cardioversion 06/06/13  . Obesity   . Atrial enlargement, left   . Hearing aid worn     both ears  . Wears glasses   . OSA on CPAP     "mask adjusts to what I need" (09/26/2013)  . Arthritis     "joints" (09/26/2013)   Past Surgical History  Procedure Laterality Date  . Total hip arthroplasty Left 2010  . Total knee arthroplasty Right 2006   . Knee arthroscopy Right 1990's  . Shoulder open rotator cuff repair Left 1990's  . Total hip revision Left 10/08/2011  . Total hip revision  04/09/2012    Procedure: TOTAL HIP REVISION;  Surgeon: Gearlean Alf, MD;   Location: [facility];  Service: Orthopedics;  Laterality: Left;  Left Hip Acetabular Revision vs Constrained Liner  . Cardioversion N/A 06/06/2013    Procedure: CARDIOVERSION;  Surgeon: Sanda Klein, MD;  Location: [facility] ENDOSCOPY;  Service: Cardiovascular;  Laterality: N/A;  . Carpal tunnel release Right 08/08/2013    Procedure: RIGHT CARPAL TUNNEL RELEASE;  Surgeon: Wynonia Sours, MD;  Location: [facility];  Service: Orthopedics;  Laterality: Right;  . Trigger finger release Right 08/08/2013    Procedure: RELEASE STENOSING TENOSYNOVITIS RIGHT THUMB;  Surgeon: Wynonia Sours, MD;  Location: [facility];  Service: Orthopedics;  Laterality: Right;  . Tonsillectomy  1950's  . Shoulder open rotator cuff repair Right 11/2007  . Carpal tunnel release Left 2008  . Cardioversion N/A 09/28/2013    Procedure: CARDIOVERSION BEDSIDE;  Surgeon: Peter M Martinique, MD;  Location: [facility] OR;  Service: Cardiovascular;  Laterality: N/A;    Current Outpatient Prescriptions  Medication Sig Dispense Refill  . amLODipine (NORVASC) 5 MG tablet Take 5 mg by mouth daily.     . furosemide (LASIX) 20 MG tablet Take 1 tablet (20 mg total) by mouth daily. 90 tablet 90  . lisinopril (PRINIVIL,ZESTRIL) 40 MG tablet Take 40 mg by mouth every morning.    . metoprolol succinate (TOPROL-XL) 50 MG 24 hr tablet TAKE 1 TABLET (50  MG TOTAL) BY MOUTH DAILY. 30 tablet 1  . potassium chloride (KLOR-CON M10) 10 MEQ tablet Take 1 tablet (10 mEq total) by mouth daily. 90 tablet 0  . Probiotic Product (PROBIOTIC PO) Take 1 capsule by mouth daily.    Marland Kitchen TIKOSYN 500 MCG capsule TAKE 1 CAPSULE BY MOUTH EVERY 12 HOURS 180 capsule 1  . XARELTO 20 MG TABS tablet TAKE 1 TABLET (20 MG TOTAL) BY MOUTH DAILY. 30 tablet 6   No current facility-administered medications for this encounter.    Allergies  Allergen Reactions  . Oxycodone Rash    Can tolerate Hydrocodone    Social History   Social History  . Marital Status: Married     Spouse Name: N/A  . Number of Children: N/A  . Years of Education: N/A   Occupational History  . Not on file.   Social History Main Topics  . Smoking status: Never Smoker   . Smokeless tobacco: Never Used  . Alcohol Use: No  . Drug Use: No  . Sexual Activity: Yes   Other Topics Concern  . Not on file   Social History Narrative   Pt lives in [place] with spouse.  Leadership training and behavioral safety training.    Family History  Problem Relation Age of Onset  . Other      mother had a pacemaker    ROS- All systems are reviewed and negative except as per the HPI above  Physical Exam: Filed Vitals:   08/13/15 0935  BP: 138/84  Pulse: 60  Height: 5\' 10"  (1.778 m)  Weight: 286 lb 3.2 oz (129.819 kg)    GEN- The patient is well appearing, alert and oriented x 3 today.   Head- normocephalic, atraumatic Eyes-  Sclera clear, conjunctiva pink Ears- hearing intact Oropharynx- clear Neck- supple, no JVP Lymph- no cervical lymphadenopathy Lungs- Clear to ausculation bilaterally, normal work of breathing Heart- Regular rate and rhythm, no murmurs, rubs or gallops, PMI not laterally displaced GI- soft, NT, ND, + BS Extremities- no clubbing, cyanosis, or edema MS- no significant deformity or atrophy Skin- no rash or lesion Psych- euthymic mood, full affect Neuro- strength and sensation are intact  EKG- Sinus brady, 55 bpm, septal infarct age undetermined. Pr int 214 ms, Qrs int 96 ms, QTc 478 ms.(stable) Epic records reviwed  Assessment and Plan: 1. Persistent afib For most part staying in SR, ok with current amount of afib burden, worse over Christmas, ? Holiday heart verses recent stress of moving Continue tiksoyn, qtc stable Bmet/mag today  2. Hypertensive CV disease Stable Restrict salt/ weight loss Continue amlodipine, furosemide, lisinopril, metoprolol  3. Severe LA enlargement So far, for most part, staying in SR with tikosyn Ultimately,per Dr.  Rayann Heman, he may require a rate control strategy vrs aggressive rhythm approach with Maze  4. OSA Continue use of cpap  F/u in 3 months in afib clinic for surveillance of tikosyn use   Geroge Baseman. [facility] [address] [place], [place] [address] [place] [phone]

## 2015-11-14 ENCOUNTER — Encounter: Payer: Self-pay | Admitting: Internal Medicine

## 2015-11-14 ENCOUNTER — Ambulatory Visit (INDEPENDENT_AMBULATORY_CARE_PROVIDER_SITE_OTHER): Payer: Self-pay | Admitting: Internal Medicine

## 2015-11-14 VITALS — BP 138/78 | HR 92 | Ht 70.0 in | Wt 281.4 lb

## 2015-11-14 DIAGNOSIS — I480 Paroxysmal atrial fibrillation: Secondary | ICD-10-CM

## 2015-11-14 DIAGNOSIS — I119 Hypertensive heart disease without heart failure: Secondary | ICD-10-CM

## 2015-11-14 DIAGNOSIS — I517 Cardiomegaly: Secondary | ICD-10-CM

## 2015-11-14 DIAGNOSIS — G4733 Obstructive sleep apnea (adult) (pediatric): Secondary | ICD-10-CM

## 2015-11-14 NOTE — Progress Notes (Signed)
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   He is mostly maintaining sinus rhythm with tikosyn but does have frequent episodes of AF.  He reports episodes once every few weeks lasting several days.  Reports fatigue and decreased exercise tolerance when in AF.  Today, he denies symptoms of  presyncope, syncope, or neurologic sequela. The patient is tolerating medications without difficulties and is otherwise without complaint today.   Past Medical History  Diagnosis Date  . PONV (postoperative nausea and vomiting)     slow to wake up after  . Hypertension   . Rotator cuff tear, right dec 2012    physical therapy done, decreased strength  . Persistent atrial fibrillation ([place])     cardioversion 06/06/13  . Obesity   . Atrial enlargement, left   . Hearing aid worn     both ears  . Wears glasses   . OSA on CPAP     "mask adjusts to what I need" (09/26/2013)  . Arthritis     "joints" (09/26/2013)   Past Surgical History  Procedure Laterality Date  . Total hip arthroplasty Left 2010  . Total knee arthroplasty Right 2006   . Knee arthroscopy Right 1990's  . Shoulder open rotator cuff repair Left 1990's  . Total hip revision Left 10/08/2011  . Total hip revision  04/09/2012    Procedure: TOTAL HIP REVISION;  Surgeon: Gearlean Alf, MD;  Location: [facility];  Service: Orthopedics;  Laterality: Left;  Left Hip Acetabular Revision vs Constrained Liner  . Cardioversion N/A 06/06/2013    Procedure: CARDIOVERSION;  Surgeon: Sanda Klein, MD;  Location: [facility];  Service: Cardiovascular;  Laterality: N/A;  . Carpal tunnel release Right 08/08/2013    Procedure: RIGHT CARPAL TUNNEL RELEASE;  Surgeon: Wynonia Sours, MD;  Location: [place];  Service: Orthopedics;  Laterality: Right;  .  Trigger finger release Right 08/08/2013    Procedure: RELEASE STENOSING TENOSYNOVITIS RIGHT THUMB;  Surgeon: Wynonia Sours, MD;  Location: [facility];  Service: Orthopedics;  Laterality: Right;  . Tonsillectomy  1950's  . Shoulder open rotator cuff repair Right 11/2007  . Carpal tunnel release Left 2008  . Cardioversion N/A 09/28/2013    Procedure: CARDIOVERSION BEDSIDE;  Surgeon: Peter M Martinique, MD;  Location: [facility] OR;  Service: Cardiovascular;  Laterality: N/A;    Current Outpatient Prescriptions  Medication Sig Dispense Refill  . amLODipine (NORVASC) 5 MG tablet Take 5 mg by mouth daily.     . furosemide (LASIX) 40 MG tablet Take 40 mg by mouth daily.    Marland Kitchen KLOR-CON 10 10 MEQ tablet Take 1 tablet by mouth daily.  6  . lisinopril (PRINIVIL,ZESTRIL) 40 MG tablet Take 40 mg by mouth every morning.    . metoprolol succinate (TOPROL-XL) 50 MG 24 hr tablet TAKE 1 TABLET (50 MG TOTAL) BY MOUTH DAILY. 30 tablet 1  . potassium chloride (KLOR-CON M10) 10 MEQ tablet Take 1 tablet (10 mEq total) by mouth daily. 90 tablet 0  . Probiotic Product (PROBIOTIC PO) Take 1 capsule by mouth daily.    Marland Kitchen TIKOSYN 500 MCG capsule TAKE 1 CAPSULE BY MOUTH EVERY 12 HOURS 180 capsule 1  . XARELTO 20 MG TABS tablet TAKE 1 TABLET (20 MG TOTAL) BY MOUTH DAILY. 30 tablet 6  No current facility-administered medications for this visit.    Allergies  Allergen Reactions  . Oxycodone Rash    Can tolerate Hydrocodone    Social History   Social History  . Marital Status: Married    Spouse Name: N/A  . Number of Children: N/A  . Years of Education: N/A   Occupational History  . Not on file.   Social History Main Topics  . Smoking status: Never Smoker   . Smokeless tobacco: Never Used  . Alcohol Use: No  . Drug Use: No  . Sexual Activity: Yes   Other Topics Concern  . Not on file   Social History Narrative   Pt lives in [place] with spouse.  Leadership training and behavioral safety  training.    Family History  Problem Relation Age of Onset  . Other      mother had a pacemaker    ROS- All systems are reviewed and negative except as per the HPI above  Physical Exam:  Filed Vitals:   11/14/15 1608  BP: 138/78  Pulse: 92   GEN- The patient is morbidly obese appearing, alert and oriented x 3 today.   Head- normocephalic, atraumatic Eyes-  Sclera clear, conjunctiva pink Ears- hearing intact Oropharynx- clear Neck- supple,  Lungs- Clear to ausculation bilaterally, normal work of breathing Heart- irregular rate and rhythm, no murmurs, rubs or gallops, PMI not laterally displaced GI- soft, NT, ND, + BS Extremities- no clubbing, cyanosis, or edema MS- no significant deformity or atrophy Skin- no rash or lesion Psych- euthymic mood, full affect Neuro- strength and sensation are intact  EKG today reveals afib, V rate 92 bpm  Assessment and Plan:  1. Persistent afib Given severe LA enlargement, I worry that our ability to maintain sinus rhythm long term is limited.  Given severe LA enlargment, not an ablation candidate.  We discussed continuing current therapy vs amiodarone vs referral for MAZE today.  At this time, he is clear that he would prefer to continue his current strategy with tikosyn.  Should his atrial arrhythmia burden increase then he will consider amiodarone vs MAZE at that time. He has elected to not have health insurance and has to pay out of pocket for all of his care.  This is a major factor in his decision.  2. Hypertensive cardiovascular disease Salt restriction and weight loss are advised  3. Severe LA enlargement Repeat echo at this time. Ultimately he may require a rate control strategy vs an aggressive rhythm approach with MAZE.  4. OSA Compliance with CPAP is advised.  Follow-up with Dr Gwenlyn Found as scheduled Return to see me in 6 months  Thompson Grayer MD, [facility] 11/14/2015 9:47 PM

## 2015-11-14 NOTE — Patient Instructions (Signed)
Medication Instructions:  Your physician recommends that you continue on your current medications as directed. Please refer to the Current Medication list given to you today.   Labwork: None ordered   Testing/Procedures: Your physician has requested that you have an echocardiogram. Echocardiography is a painless test that uses sound waves to create images of your heart. It provides your doctor with information about the size and shape of your heart and how well your heart's chambers and valves are working. This procedure takes approximately one hour. There are no restrictions for this procedure.     Follow-Up: Your physician wants you to follow-up in: 6 months with Dr Allred You will receive a reminder letter in the mail two months in advance. If you don't receive a letter, please call our office to schedule the follow-up appointment.     Any Other Special Instructions Will Be Listed Below (If Applicable).     If you need a refill on your cardiac medications before your next appointment, please call your pharmacy.   

## 2015-12-17 ENCOUNTER — Ambulatory Visit (HOSPITAL_COMMUNITY): Payer: Self-pay | Attending: Cardiology

## 2015-12-17 ENCOUNTER — Other Ambulatory Visit: Payer: Self-pay

## 2015-12-17 DIAGNOSIS — I119 Hypertensive heart disease without heart failure: Secondary | ICD-10-CM | POA: Insufficient documentation

## 2015-12-17 DIAGNOSIS — E669 Obesity, unspecified: Secondary | ICD-10-CM | POA: Insufficient documentation

## 2015-12-17 DIAGNOSIS — I480 Paroxysmal atrial fibrillation: Secondary | ICD-10-CM | POA: Insufficient documentation

## 2015-12-17 DIAGNOSIS — I351 Nonrheumatic aortic (valve) insufficiency: Secondary | ICD-10-CM | POA: Insufficient documentation

## 2015-12-17 DIAGNOSIS — Z6841 Body Mass Index (BMI) 40.0 and over, adult: Secondary | ICD-10-CM | POA: Insufficient documentation

## 2015-12-17 DIAGNOSIS — I77810 Thoracic aortic ectasia: Secondary | ICD-10-CM | POA: Insufficient documentation

## 2015-12-17 DIAGNOSIS — G4733 Obstructive sleep apnea (adult) (pediatric): Secondary | ICD-10-CM | POA: Insufficient documentation

## 2015-12-17 LAB — ECHOCARDIOGRAM COMPLETE
AVLVOTPG: 5 mmHg
Ao-asc: 40 cm
CHL CUP DOP CALC LVOT VTI: 27.3 cm
EERAT: 9.19
EWDT: 306 ms
FS: 27 % — AB (ref 28–44)
IV/PV OW: 1.23
LA ID, A-P, ES: 62 cm
LA vol index: 36.5 mL/m2
LADIAMINDEX: 2.57 cm/m2
LAVOL: 88 cm3
LAVOLA4C: 81 mL
LEFT ATRIUM END SYS DIAM: 62 cm
LV E/e' medial: 9.19
LV PW d: 14.1 mm — AB (ref 0.6–1.1)
LVEEAVG: 9.19
LVELAT: 7.68 cm/s
LVOT SV: 113 cm3
LVOT area: 4.15 cm2
LVOT diameter: 23 cm
LVOTPV: 117 m/s
MV Dec: 306
MVPKAVEL: 68.2 m/s
MVPKEVEL: 70.6 m/s
P 1/2 time: 675 ms
TDI e' lateral: 7.68
TDI e' medial: 5.59

## 2015-12-25 ENCOUNTER — Other Ambulatory Visit: Payer: Self-pay | Admitting: Internal Medicine

## 2016-04-23 ENCOUNTER — Other Ambulatory Visit: Payer: Self-pay | Admitting: Orthopedic Surgery

## 2016-04-23 DIAGNOSIS — M5416 Radiculopathy, lumbar region: Secondary | ICD-10-CM

## 2016-06-04 ENCOUNTER — Other Ambulatory Visit (HOSPITAL_COMMUNITY): Payer: Self-pay | Admitting: Nurse Practitioner

## 2016-07-02 ENCOUNTER — Encounter: Payer: Self-pay | Admitting: *Deleted

## 2016-07-23 ENCOUNTER — Ambulatory Visit (INDEPENDENT_AMBULATORY_CARE_PROVIDER_SITE_OTHER): Payer: BLUE CROSS/BLUE SHIELD | Admitting: Internal Medicine

## 2016-07-23 ENCOUNTER — Encounter: Payer: Self-pay | Admitting: Internal Medicine

## 2016-07-23 VITALS — BP 124/66 | HR 86 | Ht 70.0 in | Wt 266.0 lb

## 2016-07-23 DIAGNOSIS — G4733 Obstructive sleep apnea (adult) (pediatric): Secondary | ICD-10-CM | POA: Diagnosis not present

## 2016-07-23 DIAGNOSIS — I517 Cardiomegaly: Secondary | ICD-10-CM

## 2016-07-23 DIAGNOSIS — I480 Paroxysmal atrial fibrillation: Secondary | ICD-10-CM

## 2016-07-23 DIAGNOSIS — I119 Hypertensive heart disease without heart failure: Secondary | ICD-10-CM

## 2016-07-23 MED ORDER — FUROSEMIDE 40 MG PO TABS: 40.0000 mg | ORAL_TABLET | ORAL | 3 refills | Status: DC

## 2016-07-23 NOTE — Progress Notes (Signed)
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   He is mostly maintaining sinus rhythm with tikosyn but does have episodes of AF.   Episodes now occur every few months, lasting only several hours.  He is in afib today and is aware.  He has been working diligently on weight reduction and feels that his afib has improved with this. Today, he denies symptoms of  presyncope, syncope, or neurologic sequela. The patient is tolerating medications without difficulties and is otherwise without complaint today.   Past Medical History:  Diagnosis Date  . Arthritis    "joints" (09/26/2013)  . Atrial enlargement, left   . Hearing aid worn    both ears  . Hypertension   . Obesity   . OSA on CPAP    "mask adjusts to what I need" (09/26/2013)  . Persistent atrial fibrillation ([place])    cardioversion 06/06/13  . PONV (postoperative nausea and vomiting)    slow to wake up after  . Rotator cuff tear, right dec 2012   physical therapy done, decreased strength  . Wears glasses    Past Surgical History:  Procedure Laterality Date  . CARDIOVERSION N/A 06/06/2013   Procedure: CARDIOVERSION;  Surgeon: Sanda Klein, MD;  Location: [facility] ENDOSCOPY;  Service: Cardiovascular;  Laterality: N/A;  . CARDIOVERSION N/A 09/28/2013   Procedure: CARDIOVERSION BEDSIDE;  Surgeon: Peter M Martinique, MD;  Location: [facility];  Service: Cardiovascular;  Laterality: N/A;  . CARPAL TUNNEL RELEASE Right 08/08/2013   Procedure: RIGHT CARPAL TUNNEL RELEASE;  Surgeon: Wynonia Sours, MD;  Location: [facility];  Service: Orthopedics;  Laterality: Right;  . CARPAL TUNNEL RELEASE Left 2008  . KNEE ARTHROSCOPY Right 1990's  . SHOULDER OPEN ROTATOR CUFF REPAIR Left 1990's  . SHOULDER OPEN ROTATOR CUFF REPAIR Right 11/2007  . TONSILLECTOMY   1950's  . TOTAL HIP ARTHROPLASTY Left 2010  . TOTAL HIP REVISION Left 10/08/2011  . TOTAL HIP REVISION  04/09/2012   Procedure: TOTAL HIP REVISION;  Surgeon: Gearlean Alf, MD;  Location: [facility];  Service: Orthopedics;  Laterality: Left;  Left Hip Acetabular Revision vs Constrained Liner  . TOTAL KNEE ARTHROPLASTY Right 2006   . TRIGGER FINGER RELEASE Right 08/08/2013   Procedure: RELEASE STENOSING TENOSYNOVITIS RIGHT THUMB;  Surgeon: Wynonia Sours, MD;  Location: [facility];  Service: Orthopedics;  Laterality: Right;    Current Outpatient Prescriptions  Medication Sig Dispense Refill  . amLODipine (NORVASC) 5 MG tablet Take 5 mg by mouth daily.     . furosemide (LASIX) 40 MG tablet Take 40 mg by mouth daily.    Marland Kitchen KLOR-CON 10 10 MEQ tablet Take 1 tablet by mouth daily.  6  . lisinopril (PRINIVIL,ZESTRIL) 40 MG tablet Take 40 mg by mouth every morning.    . metoprolol succinate (TOPROL-XL) 50 MG 24 hr tablet TAKE 1 TABLET (50 MG TOTAL) BY MOUTH DAILY. 30 tablet 1  . Probiotic Product (PROBIOTIC PO) Take 1 capsule by mouth daily.    Marland Kitchen TIKOSYN 500 MCG capsule TAKE 1 CAPSULE BY MOUTH EVERY 12 HOURS 180 capsule 3  . XARELTO 20 MG TABS tablet TAKE 1 TABLET (20 MG TOTAL) BY MOUTH DAILY. 30 tablet 6   No current facility-administered medications for this visit.     Allergies  Allergen Reactions  . Oxycodone Rash    Can tolerate Hydrocodone    Social History   Social History  . Marital status: Married    Spouse name: N/A  . Number of children: N/A  . Years of education: N/A   Occupational History  . Not on file.   Social History Main Topics  . Smoking status: Never Smoker  . Smokeless tobacco: Never Used  . Alcohol use No  . Drug use: No  . Sexual activity: Yes   Other Topics Concern  . Not on file   Social History Narrative   Pt lives in [place] with spouse.  Leadership training and behavioral safety training.    Family History  Problem Relation Age of  Onset  . Other      mother had a pacemaker    ROS- All systems are reviewed and negative except as per the HPI above  Physical Exam:  Vitals:   07/23/16 1559  BP: 124/66  Pulse: 86   GEN- The patient is morbidly obese appearing, alert and oriented x 3 today.   Head- normocephalic, atraumatic Eyes-  Sclera clear, conjunctiva pink Ears- hearing intact Oropharynx- clear Neck- supple,  Lungs- Clear to ausculation bilaterally, normal work of breathing Heart- irregular rate and rhythm, no murmurs, rubs or gallops, PMI not laterally displaced GI- soft, NT, ND, + BS Extremities- no clubbing, cyanosis, or edema MS- no significant deformity or atrophy Skin- no rash or lesion Psych- euthymic mood, full affect Neuro- strength and sensation are intact  EKG today reveals afib, V rate 86 bpm, Qtc 461 msec  Assessment and Plan:  1. Persistent afib Given severe LA enlargement, I worry that our ability to maintain sinus rhythm long term is limited.   LA on last echo was >68mm!  Given severe LA enlargment, not an ideal ablation candidate.  We discussed continuing current therapy vs amiodarone vs referral for MAZE previously.   He was in afib a year ago as well.  I suspect that he may be having more afib than he realizes.  He does not wish to stop tikosyn at this time. He will have PCP check bmet, and Mg and send report to me (next visit is next wek).  2. Hypertensive cardiovascular disease bp is controlled He wishes to stop norvasc and reduce lasix to QOD in order to reduce his pill burden He is diligent with BP monitoring and will restart medicine if elevated  3. Severe LA enlargement As above  4. OSA Compliance with CPAP is advised.  Follow-up with Dr Gwenlyn Found as scheduled Return to see me in 6 months  Thompson Grayer MD, [facility] 07/23/2016 4:21 PM

## 2016-07-23 NOTE — Patient Instructions (Signed)
Medication Instructions:  Your physician has recommended you make the following change in your medication:  1) Stop Norvasc 2) Reduce Furosemide to 40 mg every other day   Labwork: None ordered   Testing/Procedures: None ordered   Follow-Up: Your physician wants you to follow-up in: 6 months with Dr Vallery Ridge will receive a reminder letter in the mail two months in advance. If you don't receive a letter, please call our office to schedule the follow-up appointment.   Any Other Special Instructions Will Be Listed Below (If Applicable).     If you need a refill on your cardiac medications before your next appointment, please call your pharmacy.

## 2016-07-29 ENCOUNTER — Encounter: Payer: Self-pay | Admitting: Internal Medicine

## 2016-10-01 ENCOUNTER — Telehealth: Payer: Self-pay | Admitting: Internal Medicine

## 2016-10-01 NOTE — Telephone Encounter (Signed)
New message    April 16 9am  appt   Pt is having tingling in chest (it only happened 1 time) but now he his very tired

## 2016-10-01 NOTE — Telephone Encounter (Signed)
10/01/2016 14:10 Returned call to patient who reports an episode of "tingling" in his chest two weeks ago.  Duration about 10 seconds. Pt denies shortness of breath, nausea or diaphoresis.  Pt has not had the sensation since the one episode two weeks ago.   Pt will call if he has any new/additional symptoms.  Pt states he is checking his blood pressure and hasn't noted any changes.  Georgana Curio MHA RN CCM

## 2016-10-27 ENCOUNTER — Ambulatory Visit (INDEPENDENT_AMBULATORY_CARE_PROVIDER_SITE_OTHER): Payer: BLUE CROSS/BLUE SHIELD | Admitting: Internal Medicine

## 2016-10-27 ENCOUNTER — Encounter: Payer: Self-pay | Admitting: Internal Medicine

## 2016-10-27 VITALS — BP 140/82 | HR 55 | Ht 70.0 in | Wt 271.5 lb

## 2016-10-27 DIAGNOSIS — I480 Paroxysmal atrial fibrillation: Secondary | ICD-10-CM | POA: Diagnosis not present

## 2016-10-27 DIAGNOSIS — R0789 Other chest pain: Secondary | ICD-10-CM | POA: Diagnosis not present

## 2016-10-27 LAB — CBC WITH DIFFERENTIAL/PLATELET
BASOS: 0 %
Basophils Absolute: 0 10*3/uL (ref 0.0–0.2)
EOS (ABSOLUTE): 0.3 10*3/uL (ref 0.0–0.4)
EOS: 3 %
HEMATOCRIT: 43.3 % (ref 37.5–51.0)
Hemoglobin: 14.8 g/dL (ref 13.0–17.7)
Immature Grans (Abs): 0.2 10*3/uL — ABNORMAL HIGH (ref 0.0–0.1)
Immature Granulocytes: 2 %
Lymphocytes Absolute: 1.9 10*3/uL (ref 0.7–3.1)
Lymphs: 21 %
MCH: 28 pg (ref 26.6–33.0)
MCHC: 34.2 g/dL (ref 31.5–35.7)
MCV: 82 fL (ref 79–97)
MONOS ABS: 1 10*3/uL — AB (ref 0.1–0.9)
Monocytes: 11 %
NEUTROS PCT: 63 %
Neutrophils Absolute: 5.9 10*3/uL (ref 1.4–7.0)
Platelets: 180 10*3/uL (ref 150–379)
RBC: 5.29 x10E6/uL (ref 4.14–5.80)
RDW: 13.3 % (ref 12.3–15.4)
WBC: 9.3 10*3/uL (ref 3.4–10.8)

## 2016-10-27 LAB — BASIC METABOLIC PANEL
BUN/Creatinine Ratio: 24 (ref 10–24)
BUN: 21 mg/dL (ref 8–27)
CALCIUM: 9.1 mg/dL (ref 8.6–10.2)
CO2: 23 mmol/L (ref 18–29)
CREATININE: 0.87 mg/dL (ref 0.76–1.27)
Chloride: 101 mmol/L (ref 96–106)
GFR, EST AFRICAN AMERICAN: 106 mL/min/{1.73_m2} (ref >59)
GFR, EST NON AFRICAN AMERICAN: 92 mL/min/{1.73_m2} (ref >59)
Glucose: 85 mg/dL (ref 65–99)
POTASSIUM: 3.8 mmol/L (ref 3.5–5.2)
Sodium: 142 mmol/L (ref 134–144)

## 2016-10-27 LAB — MAGNESIUM: Magnesium: 1.9 mg/dL (ref 1.6–2.3)

## 2016-10-27 NOTE — Patient Instructions (Signed)
Medication Instructions:  Your physician recommends that you continue on your current medications as directed. Please refer to the Current Medication list given to you today.   Labwork: Your physician recommends that you return for lab work today: CBC BMP Mag   Testing/Procedures: Your physician has requested that you have an echocardiogram. Echocardiography is a painless test that uses sound waves to create images of your heart. It provides your doctor with information about the size and shape of your heart and how well your heart's chambers and valves are working. This procedure takes approximately one hour. There are no restrictions for this procedure.  Your physician has requested that you have en exercise stress myoview. For further information please visit [email or web address]. Please follow instruction sheet, as given.    Follow-Up: Your physician wants you to follow-up in: 6 months with Dr Vallery Ridge will receive a reminder letter in the mail two months in advance. If you don't receive a letter, please call our office to schedule the follow-up appointment.   Any Other Special Instructions Will Be Listed Below (If Applicable).     If you need a refill on your cardiac medications before your next appointment, please call your pharmacy.

## 2016-10-27 NOTE — Progress Notes (Signed)
Primary Care Physician: Kandice Hams, MD Referring Physician:  Dr Gwenlyn Found (has not seen since 2015)  [name] is a 66 y.o. male with a h/o persistent atrial fibrillaiton, OSA (uses CPAP), morbid obestiy, and LA enlargement who presents for EP follow-up.   He is mostly maintaining sinus rhythm.  He has had occasional "chest tightness".   This typically occurs at rest.  He is quite active.  He also had severe chest discomfort 10/01/16 for which he called our office (telephone note reviewed today).  He denies SOB.  He is active.  He works as a Company secretary. Today, he denies symptoms of  presyncope, syncope, or neurologic sequela. The patient is tolerating medications without difficulties and is otherwise without complaint today.   Past Medical History:  Diagnosis Date  . Arthritis    "joints" (09/26/2013)  . Atrial enlargement, left   . Hearing aid worn    both ears  . Hypertension   . Obesity   . OSA on CPAP    "mask adjusts to what I need" (09/26/2013)  . Persistent atrial fibrillation ([place])    cardioversion 06/06/13  . PONV (postoperative nausea and vomiting)    slow to wake up after  . Rotator cuff tear, right dec 2012   physical therapy done, decreased strength  . Wears glasses    Past Surgical History:  Procedure Laterality Date  . CARDIOVERSION N/A 06/06/2013   Procedure: CARDIOVERSION;  Surgeon: Sanda Klein, MD;  Location: [facility];  Service: Cardiovascular;  Laterality: N/A;  . CARDIOVERSION N/A 09/28/2013   Procedure: CARDIOVERSION BEDSIDE;  Surgeon: Peter M Martinique, MD;  Location: [place];  Service: Cardiovascular;  Laterality: N/A;  . CARPAL TUNNEL RELEASE Right 08/08/2013   Procedure: RIGHT CARPAL TUNNEL RELEASE;  Surgeon: Wynonia Sours, MD;  Location: [facility];  Service: Orthopedics;  Laterality: Right;  . CARPAL TUNNEL RELEASE Left 2008  . KNEE ARTHROSCOPY Right 1990's  . SHOULDER OPEN ROTATOR CUFF REPAIR Left 1990's  . SHOULDER OPEN  ROTATOR CUFF REPAIR Right 11/2007  . TONSILLECTOMY  1950's  . TOTAL HIP ARTHROPLASTY Left 2010  . TOTAL HIP REVISION Left 10/08/2011  . TOTAL HIP REVISION  04/09/2012   Procedure: TOTAL HIP REVISION;  Surgeon: Gearlean Alf, MD;  Location: [facility];  Service: Orthopedics;  Laterality: Left;  Left Hip Acetabular Revision vs Constrained Liner  . TOTAL KNEE ARTHROPLASTY Right 2006   . TRIGGER FINGER RELEASE Right 08/08/2013   Procedure: RELEASE STENOSING TENOSYNOVITIS RIGHT THUMB;  Surgeon: Wynonia Sours, MD;  Location: [place];  Service: Orthopedics;  Laterality: Right;    Current Outpatient Prescriptions  Medication Sig Dispense Refill  . furosemide (LASIX) 40 MG tablet Take 1 tablet (40 mg total) by mouth every other day. 45 tablet 3  . KLOR-CON 10 10 MEQ tablet Take 1 tablet by mouth daily.  6  . lisinopril (PRINIVIL,ZESTRIL) 40 MG tablet Take 40 mg by mouth every morning.    . metoprolol succinate (TOPROL-XL) 50 MG 24 hr tablet TAKE 1 TABLET (50 MG TOTAL) BY MOUTH DAILY. 30 tablet 1  . Probiotic Product (PROBIOTIC PO) Take 1 capsule by mouth daily.    Marland Kitchen TIKOSYN 500 MCG capsule TAKE 1 CAPSULE BY MOUTH EVERY 12 HOURS 180 capsule 3  . XARELTO 20 MG TABS tablet TAKE 1 TABLET (20 MG TOTAL) BY MOUTH DAILY. 30 tablet 6   No current facility-administered medications for this visit.     Allergies  Allergen Reactions  . Oxycodone Rash    Can tolerate Hydrocodone    Social History   Social History  . Marital status: Married    Spouse name: N/A  . Number of children: N/A  . Years of education: N/A   Occupational History  . Not on file.   Social History Main Topics  . Smoking status: Never Smoker  . Smokeless tobacco: Never Used  . Alcohol use No  . Drug use: No  . Sexual activity: Yes   Other Topics Concern  . Not on file   Social History Narrative   Pt lives in [place] with spouse.  Leadership training and behavioral safety training.    Family History    Problem Relation Age of Onset  . Other      mother had a pacemaker    ROS- All systems are reviewed and negative except as per the HPI above  Physical Exam:  Vitals:   10/27/16 0851  BP: 140/82  Pulse: (!) 55   GEN- The patient is morbidly obese appearing, alert and oriented x 3 today.   Head- normocephalic, atraumatic Oropharynx- clear Neck- supple,  Lungs- Clear to ausculation bilaterally, normal work of breathing Heart- irregular rate and rhythm, no murmurs, rubs or gallops, PMI not laterally displaced GI- soft, NT, ND, + BS Extremities- no clubbing, cyanosis, or edema MS- no significant deformity or atrophy Skin- no rash or lesion Psych- euthymic mood, full affect Neuro- strength and sensation are intact  EKG today reveals sinus rhythm 55 bpm, septal infarct, Qtc 470 msec  Assessment and Plan:  1. Persistent afib Given severe LA enlargement, I worry that our ability to maintain sinus rhythm long term is limited.   LA on last echo was >62mm!  Repeat echo at this time.  Continue tikosyn.  bmet, mg today Cbc today as he is on xarelto.  2. Hypertensive cardiovascular disease Stable No change required today  3. Severe LA enlargement As above  4. OSA Compliance with CPAP is advised.  5. Chest discomfort Both typical and atypical features Exercise myoview is ordered He is instructed be me to hold toprol for 48 hours prior to his stress test.  Return to see me in 6 months He does not wish to be seen routinely in the AF clinic  Thompson Grayer MD, [facility] 10/27/2016 9:09 AM

## 2016-11-04 ENCOUNTER — Telehealth (HOSPITAL_COMMUNITY): Payer: Self-pay | Admitting: *Deleted

## 2016-11-04 NOTE — Telephone Encounter (Signed)
Left message on voicemail in reference to upcoming appointment scheduled for 11/07/16 Phone number given for a call back so details instructions can be given.  [name]

## 2016-11-06 ENCOUNTER — Telehealth (HOSPITAL_COMMUNITY): Payer: Self-pay

## 2016-11-06 NOTE — Telephone Encounter (Signed)
Patient given detailed instructions per Myocardial Perfusion Study Information Sheet for the test on 11/07/16 at 0845. Patient notified to arrive 15 minutes early and that it is imperative to arrive on time for appointment to keep from having the test rescheduled.  If you need to cancel or reschedule your appointment, please call the office within 24 hours of your appointment. Failure to do so may result in a cancellation of your appointment, and a $50 no show fee. Patient verbalized understanding.T. Erhardt Dada, CNMT, RT-N

## 2016-11-07 ENCOUNTER — Ambulatory Visit (HOSPITAL_COMMUNITY): Payer: BLUE CROSS/BLUE SHIELD | Attending: Cardiology

## 2016-11-07 ENCOUNTER — Ambulatory Visit (HOSPITAL_BASED_OUTPATIENT_CLINIC_OR_DEPARTMENT_OTHER): Payer: BLUE CROSS/BLUE SHIELD

## 2016-11-07 ENCOUNTER — Other Ambulatory Visit: Payer: Self-pay

## 2016-11-07 DIAGNOSIS — I351 Nonrheumatic aortic (valve) insufficiency: Secondary | ICD-10-CM | POA: Insufficient documentation

## 2016-11-07 DIAGNOSIS — G4733 Obstructive sleep apnea (adult) (pediatric): Secondary | ICD-10-CM | POA: Diagnosis not present

## 2016-11-07 DIAGNOSIS — R0789 Other chest pain: Secondary | ICD-10-CM | POA: Diagnosis not present

## 2016-11-07 DIAGNOSIS — I071 Rheumatic tricuspid insufficiency: Secondary | ICD-10-CM | POA: Insufficient documentation

## 2016-11-07 DIAGNOSIS — I480 Paroxysmal atrial fibrillation: Secondary | ICD-10-CM | POA: Diagnosis not present

## 2016-11-07 DIAGNOSIS — I10 Essential (primary) hypertension: Secondary | ICD-10-CM | POA: Diagnosis not present

## 2016-11-07 DIAGNOSIS — I340 Nonrheumatic mitral (valve) insufficiency: Secondary | ICD-10-CM | POA: Insufficient documentation

## 2016-11-07 DIAGNOSIS — E669 Obesity, unspecified: Secondary | ICD-10-CM | POA: Diagnosis not present

## 2016-11-07 LAB — MYOCARDIAL PERFUSION IMAGING
CHL CUP NUCLEAR SDS: 1
CHL CUP NUCLEAR SSS: 3
NUC STRESS TID: 0.99
Peak HR: 136 {beats}/min
RATE: 0.3
Rest HR: 74 {beats}/min
SRS: 2

## 2016-11-07 MED ORDER — PERFLUTREN LIPID MICROSPHERE
1.0000 mL | INTRAVENOUS | Status: AC | PRN
  Administered 2016-11-07: 2 mL via INTRAVENOUS

## 2016-11-07 MED ORDER — REGADENOSON 0.4 MG/5ML IV SOLN
0.4000 mg | Freq: Once | INTRAVENOUS | Status: AC
  Administered 2016-11-07: 0.4 mg via INTRAVENOUS

## 2016-11-07 MED ORDER — TECHNETIUM TC 99M TETROFOSMIN IV KIT
10.9000 | PACK | Freq: Once | INTRAVENOUS | Status: AC | PRN
Start: 2016-11-07 — End: 2016-11-07
  Administered 2016-11-07: 10.9 via INTRAVENOUS
  Filled 2016-11-07: qty 11

## 2016-11-07 MED ORDER — TECHNETIUM TC 99M TETROFOSMIN IV KIT
33.0000 | PACK | Freq: Once | INTRAVENOUS | Status: AC | PRN
  Administered 2016-11-07: 33 via INTRAVENOUS
  Filled 2016-11-07: qty 33

## 2017-01-12 ENCOUNTER — Other Ambulatory Visit: Payer: Self-pay | Admitting: Internal Medicine

## 2017-01-19 ENCOUNTER — Other Ambulatory Visit: Payer: Self-pay | Admitting: Internal Medicine

## 2017-01-19 MED ORDER — DOFETILIDE 500 MCG PO CAPS: ORAL_CAPSULE | ORAL | 2 refills | Status: DC

## 2017-02-10 ENCOUNTER — Telehealth: Payer: Self-pay | Admitting: Internal Medicine

## 2017-02-10 NOTE — Telephone Encounter (Signed)
Given his volume issues, I do think that general cardiology would be best for him. He has seen Dr Gwenlyn Found for general cardiology care previously.  Not sure if Dr Tamala Julian would be able to pick him up.  Either would be great from my perspective.

## 2017-02-10 NOTE — Telephone Encounter (Signed)
I spoke with pt's wife. She reports for the last several months pt has been retaining fluid and feeling bloated.  Swelling in feet. Does not weigh daily because he travels a lot.  Weighs at different times during day. Will weigh when he feels bloated. He has noticed that sometimes he will lose 7 lbs overnight.  Wife reports he is also constantly going to the bathroom.  Short of breath at times. Wife is concerned about frequent urination and loss of 7 lbs overnight. She states Dr. Rayann Heman has been adjusting pt's medications.  Wife is also asking if pt can see Dr. Tamala Julian.

## 2017-02-10 NOTE — Telephone Encounter (Signed)
New message     Pt would like to switch from Dr Rayann Heman to Dr Tamala Julian, for second opinion.     Pt c/o swelling: STAT is pt has developed SOB within 24 hours  1. How long have you been experiencing swelling? awhile  2. Where is the swelling located? All over   3.  Are you currently taking a "fluid pill"? Yes   4.  Are you currently SOB?  no  5.  Have you traveled recently? No  Per wife patient is having a lot of swelling he gains 7lbs then is up all night going to the bathroom, by morning the weight is gone.  Patient wife says there is no pattern to it, that its really irratic

## 2017-02-11 NOTE — Telephone Encounter (Signed)
Called and spoke with th patient and his wife on the phone and made them aware that Dr. Rayann Heman feels like he should be seen by general cardiology to manage his volume issues. Asked patient if he would be willing to see Dr. Gwenlyn Found since he has seen him in the past (2014). Patient states that he would rather see Dr. Tamala Julian. Made the patient aware that Dr. Tamala Julian is currently out of the office and that we would have to see if Dr. Tamala Julian is able to take on new patients at this time and that it may be a longer wait. Patient states that his night sweats and weight fluctuation have been going on for a while and only happens a couple of times a month and that he is willing to wait. Instructed the patient on weighing at the same time each day with the same amount of clothing to better track his weight. Patient verbalized understanding.

## 2017-02-15 NOTE — Telephone Encounter (Signed)
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

## 2017-02-16 NOTE — Telephone Encounter (Signed)
I spoke with patient's wife, pt out of town. She is aware Dr Gwenlyn Found (last 2014) would be pt's primary cardiologist to manage volume issues. I tried to arrange an appointment this week for pt with APP, pt's wife states pt is currently in [place] and  is unavailable traveling/working out of town until the end of August. Pt's wife states she will talk to pt and call back to schedule an appointment. Pt's wife did say pt's weight could change by as much as 7 pounds a day, he is having some shortness of breath. Pt's wife advised that if pt is not going to be available to come to our office this week,  that he seek medical care at an Urgent Care in his area.

## 2017-02-20 ENCOUNTER — Telehealth: Payer: Self-pay | Admitting: Cardiovascular Disease

## 2017-02-20 NOTE — Telephone Encounter (Signed)
S/w pt he states that he has no current swelling and that his weight today hast not increased but fluctuates between 7-10 Lb daily increase from day-to-day. Pt currently has appt with Suanne Marker barrett 03-23-17 and appt with Dr Gwenlyn Found 04-17-2017. I informed pt that at this I'm unable to increase medication because there is no current swelling or weight gain. I informed pt to call back when he can have a specific # for his daily weight gain. Pt put the phone down and stated to talk to his wife. Wife states that pt will be in for 03-23-17 appt and will call and can if pt does not choose to go.

## 2017-02-20 NOTE — Telephone Encounter (Signed)
New Message  Pt call requesting to speak with RN. Pt wife states pt does not need to see an APP.. I did offer APP and Provider next available. Pt wife would like to speak with RN for pt to be worked in schedule. Please call back to discuss

## 2017-02-20 NOTE — Telephone Encounter (Signed)
New message   Set appt for 9/10 8a   Pt c/o swelling: STAT is pt has developed SOB within 24 hours  1. How long have you been experiencing swelling? Months   2. Where is the swelling located? All over he is retaining 7-10 lbs of fluid  3.  Are you currently taking a "fluid pill"? Yes   4.  Are you currently SOB? no  5.  Have you traveled recently? no

## 2017-02-20 NOTE — Telephone Encounter (Signed)
Left message for pt to call.

## 2017-03-23 ENCOUNTER — Ambulatory Visit: Payer: BLUE CROSS/BLUE SHIELD | Admitting: Physician Assistant

## 2017-03-24 ENCOUNTER — Encounter: Payer: Self-pay | Admitting: Physician Assistant

## 2017-03-24 ENCOUNTER — Ambulatory Visit (INDEPENDENT_AMBULATORY_CARE_PROVIDER_SITE_OTHER): Payer: BLUE CROSS/BLUE SHIELD | Admitting: Physician Assistant

## 2017-03-24 VITALS — BP 132/68 | HR 64 | Ht 70.5 in | Wt 288.0 lb

## 2017-03-24 DIAGNOSIS — Z7901 Long term (current) use of anticoagulants: Secondary | ICD-10-CM | POA: Diagnosis not present

## 2017-03-24 DIAGNOSIS — I481 Persistent atrial fibrillation: Secondary | ICD-10-CM

## 2017-03-24 DIAGNOSIS — I5032 Chronic diastolic (congestive) heart failure: Secondary | ICD-10-CM

## 2017-03-24 DIAGNOSIS — I4819 Other persistent atrial fibrillation: Secondary | ICD-10-CM

## 2017-03-24 NOTE — Patient Instructions (Signed)
Medication Instructions:  NO CHANGES If you need a refill on your cardiac medications before your next appointment, please call your pharmacy.  Labwork: BMET AND MAG TODAY HERE IN OUR OFFICE AT [facility]  Follow-Up: Your physician wants you to follow-up in: 3 MONTHS WITH DR Gwenlyn Found. RESCHEDULE CURRENT APPOINTMENT.   Special Instructions: LOG DAILY WEIGHTS  1-2 QUART FLUID RESTRICTION DAILY  2,000MG  LOW SODIUM DIET  NUTRITION REFERRAL FOR CHF/LOW SODIUM DIET - WHILE TRAVELLING   Thank you for choosing [facility]!!     2,000 MG LOW SODIUM DIET DASH Eating Plan DASH stands for "Dietary Approaches to Stop Hypertension." The DASH eating plan is a healthy eating plan that has been shown to reduce high blood pressure (hypertension). It may also reduce your risk for type 2 diabetes, heart disease, and stroke. The DASH eating plan may also help with weight loss. What are tips for following this plan? General guidelines  Avoid eating more than 2,300 mg (milligrams) of salt (sodium) a day. If you have hypertension, you may need to reduce your sodium intake to 1,500 mg a day.  Limit alcohol intake to no more than 1 drink a day for nonpregnant women and 2 drinks a day for men. One drink equals 12 oz of beer, 5 oz of wine, or 1 oz of hard liquor.  Work with your health care provider to maintain a healthy body weight or to lose weight. Ask what an ideal weight is for you.  Get at least 30 minutes of exercise that causes your heart to beat faster (aerobic exercise) most days of the week. Activities may include walking, swimming, or biking.  Work with your health care provider or diet and nutrition specialist (dietitian) to adjust your eating plan to your individual calorie needs. Reading food labels  Check food labels for the amount of sodium per serving. Choose foods with less than 5 percent of the Daily Value of sodium. Generally, foods with less than 300 mg of sodium per  serving fit into this eating plan.  To find whole grains, look for the word "whole" as the first word in the ingredient list. Shopping  Buy products labeled as "low-sodium" or "no salt added."  Buy fresh foods. Avoid canned foods and premade or frozen meals. Cooking  Avoid adding salt when cooking. Use salt-free seasonings or herbs instead of table salt or sea salt. Check with your health care provider or pharmacist before using salt substitutes.  Do not fry foods. Cook foods using healthy methods such as baking, boiling, grilling, and broiling instead.  Cook with heart-healthy oils, such as olive, canola, soybean, or sunflower oil. Meal planning   Eat a balanced diet that includes: ? 5 or more servings of fruits and vegetables each day. At each meal, try to fill half of your plate with fruits and vegetables. ? Up to 6-8 servings of whole grains each day. ? Less than 6 oz of lean meat, poultry, or fish each day. A 3-oz serving of meat is about the same size as a deck of cards. One egg equals 1 oz. ? 2 servings of low-fat dairy each day. ? A serving of nuts, seeds, or beans 5 times each week. ? Heart-healthy fats. Healthy fats called Omega-3 fatty acids are found in foods such as flaxseeds and coldwater fish, like sardines, salmon, and mackerel.  Limit how much you eat of the following: ? Canned or prepackaged foods. ? Food that is high in trans fat, such as  fried foods. ? Food that is high in saturated fat, such as fatty meat. ? Sweets, desserts, sugary drinks, and other foods with added sugar. ? Full-fat dairy products.  Do not salt foods before eating.  Try to eat at least 2 vegetarian meals each week.  Eat more home-cooked food and less restaurant, buffet, and fast food.  When eating at a restaurant, ask that your food be prepared with less salt or no salt, if possible. What foods are recommended? The items listed may not be a complete list. Talk with your dietitian about  what dietary choices are best for you. Grains Whole-grain or whole-wheat bread. Whole-grain or whole-wheat pasta. Brown rice. [place] Morrow. Bulgur. Whole-grain and low-sodium cereals. Pita bread. Low-fat, low-sodium crackers. Whole-wheat flour tortillas. Vegetables Fresh or frozen vegetables (raw, steamed, roasted, or grilled). Low-sodium or reduced-sodium tomato and vegetable juice. Low-sodium or reduced-sodium tomato sauce and tomato paste. Low-sodium or reduced-sodium canned vegetables. Fruits All fresh, dried, or frozen fruit. Canned fruit in natural juice (without added sugar). Meat and other protein foods Skinless chicken or [place]. Ground chicken or [place]. Pork with fat trimmed off. Fish and seafood. Egg whites. Dried beans, peas, or lentils. Unsalted nuts, nut butters, and seeds. Unsalted canned beans. Lean cuts of beef with fat trimmed off. Low-sodium, lean deli meat. Dairy Low-fat (1%) or fat-free (skim) milk. Fat-free, low-fat, or reduced-fat cheeses. Nonfat, low-sodium ricotta or cottage cheese. Low-fat or nonfat yogurt. Low-fat, low-sodium cheese. Fats and oils Soft margarine without trans fats. Vegetable oil. Low-fat, reduced-fat, or light mayonnaise and salad dressings (reduced-sodium). Canola, safflower, olive, soybean, and sunflower oils. Avocado. Seasoning and other foods Herbs. Spices. Seasoning mixes without salt. Unsalted popcorn and pretzels. Fat-free sweets. What foods are not recommended? The items listed may not be a complete list. Talk with your dietitian about what dietary choices are best for you. Grains Baked goods made with fat, such as croissants, muffins, or some breads. Dry pasta or rice meal packs. Vegetables Creamed or fried vegetables. Vegetables in a cheese sauce. Regular canned vegetables (not low-sodium or reduced-sodium). Regular canned tomato sauce and paste (not low-sodium or reduced-sodium). Regular tomato and vegetable juice (not low-sodium or  reduced-sodium). Angie Fava. Olives. Fruits Canned fruit in a light or heavy syrup. Fried fruit. Fruit in cream or butter sauce. Meat and other protein foods Fatty cuts of meat. Ribs. Fried meat. Berniece Salines. Sausage. Bologna and other processed lunch meats. Salami. Fatback. Hotdogs. Bratwurst. Salted nuts and seeds. Canned beans with added salt. Canned or smoked fish. Whole eggs or egg yolks. Chicken or [place] with skin. Dairy Whole or 2% milk, cream, and half-and-half. Whole or full-fat cream cheese. Whole-fat or sweetened yogurt. Full-fat cheese. Nondairy creamers. Whipped toppings. Processed cheese and cheese spreads. Fats and oils Butter. Stick margarine. Lard. Shortening. Ghee. Bacon fat. Tropical oils, such as coconut, palm kernel, or palm oil. Seasoning and other foods Salted popcorn and pretzels. Onion salt, garlic salt, seasoned salt, table salt, and sea salt. Worcestershire sauce. Tartar sauce. Barbecue sauce. Teriyaki sauce. Soy sauce, including reduced-sodium. Steak sauce. Canned and packaged gravies. Fish sauce. Oyster sauce. Cocktail sauce. Horseradish that you find on the shelf. Ketchup. Mustard. Meat flavorings and tenderizers. Bouillon cubes. Hot sauce and Tabasco sauce. Premade or packaged marinades. Premade or packaged taco seasonings. Relishes. Regular salad dressings. Where to find more information:  National Heart, Lung, and [place]: https://wilson-eaton.com/  American Heart Association: www.heart.org Summary  The DASH eating plan is a healthy eating plan that has been shown to  reduce high blood pressure (hypertension). It may also reduce your risk for type 2 diabetes, heart disease, and stroke.  With the DASH eating plan, you should limit salt (sodium) intake to 2,300 mg a day. If you have hypertension, you may need to reduce your sodium intake to 1,500 mg a day.  When on the DASH eating plan, aim to eat more fresh fruits and vegetables, whole grains, lean proteins, low-fat  dairy, and heart-healthy fats.  Work with your health care provider or diet and nutrition specialist (dietitian) to adjust your eating plan to your individual calorie needs. This information is not intended to replace advice given to you by your health care provider. Make sure you discuss any questions you have with your health care provider. Document Released: 06/19/2011 Document Revised: 06/23/2016 Document Reviewed: 06/23/2016 Elsevier Interactive Patient Education  2017 [employer].

## 2017-03-24 NOTE — Progress Notes (Signed)
Cardiology Office Note   Date:  03/24/2017   ID:  [name], DOB 11/30/1950, MRN [id number]  PCP:  Seward Carol, MD  Cardiologist:  Dr Gwenlyn Found, 06/30/2013 Electrophysiologist: Dr Rayann Heman  10/27/2016 Rosaria Ferries, PA-C   Chief Complaint  Patient presents with  . Follow-up  . Edema    History of Present Illness: [name] is a 66 y.o. male with a history of persistent atrial fibrillaiton on Xarelto, Tikosyn, OSA (uses CPAP), morbid obestiy, and severe LA 52 mm 10/2016 echo, HOH    [name] presents for cardiology follow up and evaluation.   When he goes into afib, he feels the palpitations, has frequent urination and is exhausted. He was out of rhythm yesterday, is back in today.   He stopped the amlodipine, his BP has been ok since then.   He has been trying to eat better and exercise. He wants referral to a nutritionist.   He has been noticing bloodshot eyes in the morning, is aware that the CPAP mask is leaking a little.   He travels for work and is gone most of the time during the week. He struggles with trying to eat low Na diet in restaurants. He drinks well over 64 oz water daily, sometimes as much as 96 oz. 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 He drinks more water then, also.   He was on vacation in [place], he admits to over-eating significantly. Thinks he gained 10 lbs on vacation.   Past Medical History:  Diagnosis Date  . Arthritis    "joints" (09/26/2013)  . Atrial enlargement, left   . Hearing aid worn    both ears  . Hypertension   . Obesity   . OSA on CPAP    "mask adjusts to what I need" (09/26/2013)  . Persistent atrial fibrillation ([place])    cardioversion 06/06/13  . PONV (postoperative nausea and vomiting)    slow to wake up after  . Rotator cuff tear, right dec 2012   physical therapy done, decreased strength  . Wears glasses     Past Surgical History:  Procedure Laterality Date  .  CARDIOVERSION N/A 06/06/2013   Procedure: CARDIOVERSION;  Surgeon: Sanda Klein, MD;  Location: [facility];  Service: Cardiovascular;  Laterality: N/A;  . CARDIOVERSION N/A 09/28/2013   Procedure: CARDIOVERSION BEDSIDE;  Surgeon: Peter M Martinique, MD;  Location: [place];  Service: Cardiovascular;  Laterality: N/A;  . CARPAL TUNNEL RELEASE Right 08/08/2013   Procedure: RIGHT CARPAL TUNNEL RELEASE;  Surgeon: Wynonia Sours, MD;  Location: [place] [facility];  Service: Orthopedics;  Laterality: Right;  . CARPAL TUNNEL RELEASE Left 2008  . KNEE ARTHROSCOPY Right 1990's  . SHOULDER OPEN ROTATOR CUFF REPAIR Left 1990's  . SHOULDER OPEN ROTATOR CUFF REPAIR Right 11/2007  . TONSILLECTOMY  1950's  . TOTAL HIP ARTHROPLASTY Left 2010  . TOTAL HIP REVISION Left 10/08/2011  . TOTAL HIP REVISION  04/09/2012   Procedure: TOTAL HIP REVISION;  Surgeon: Gearlean Alf, MD;  Location: [facility];  Service: Orthopedics;  Laterality: Left;  Left Hip Acetabular Revision vs Constrained Liner  . TOTAL KNEE ARTHROPLASTY Right 2006   . TRIGGER FINGER RELEASE Right 08/08/2013   Procedure: RELEASE STENOSING TENOSYNOVITIS RIGHT THUMB;  Surgeon: Wynonia Sours, MD;  Location: [place];  Service: Orthopedics;  Laterality: Right;    Current Outpatient Prescriptions  Medication Sig Dispense Refill  . dofetilide (TIKOSYN)  500 MCG capsule TAKE 1 CAPSULE BY MOUTH EVERY 12 HOURS 180 capsule 2  . furosemide (LASIX) 40 MG tablet Take 1 tablet (40 mg total) by mouth every other day. 45 tablet 3  . KLOR-CON 10 10 MEQ tablet Take 1 tablet by mouth daily.  6  . lisinopril (PRINIVIL,ZESTRIL) 40 MG tablet Take 40 mg by mouth every morning.    . metoprolol succinate (TOPROL-XL) 50 MG 24 hr tablet TAKE 1 TABLET (50 MG TOTAL) BY MOUTH DAILY. 30 tablet 1  . Probiotic Product (PROBIOTIC PO) Take 1 capsule by mouth daily.    [name] 20 MG TABS tablet TAKE 1 TABLET (20 MG TOTAL) BY MOUTH DAILY. 30 tablet 6   No current  facility-administered medications for this visit.     Allergies:   Oxycodone    Social History:  The patient  reports that he has never smoked. He has never used smokeless tobacco. He reports that he does not drink alcohol or use drugs.   Family History:  The patient's family history includes Other in his unknown relative.    ROS:  Please see the history of present illness. All other systems are reviewed and negative.    PHYSICAL EXAM: VS:  BP 132/68   Pulse 64   Ht 5' 10.5" (1.791 m)   Wt 288 lb (130.6 kg)   BMI 40.74 kg/m  , BMI Body mass index is 40.74 kg/m. GEN: Well nourished, well developed, male in no acute distress  HEENT: normal for age  Neck: no JVD seen but difficult to assess 2nd body habitus, no carotid bruit, no masses Cardiac: RRR; soft murmur, no rubs, or gallops Respiratory: slightly decreased BS bases bilaterally, normal work of breathing GI: soft, nontender, nondistended, + BS MS: no deformity or atrophy; trace edema; distal pulses are 2+ in upper extremities, difficult to palpate in lower extrem, cap refill WNL  Skin: warm and dry, no rash Neuro:  Strength and sensation are intact Psych: euthymic mood, full affect   EKG:  EKG is ordered today. The ekg ordered today demonstrates SR, HR 64. No sig change, no acute ischemic changes  MYOVIEW: 11/07/2016  There is no evidence of ischemia  The LV is moderately dilated  The study is not gated so the LV function was not assessed.  Suggest echo for assessment of LV function .  Intermittant risk myoview based on LV dilitation and likely reduced EF  ECHO: 11/07/2016 - Left ventricle: The cavity size was normal. Wall thickness was   increased in a pattern of moderate LVH. There was mild focal   basal hypertrophy of the septum. Systolic function was normal.   The estimated ejection fraction was in the range of 55% to 60%.   Wall motion was normal; there were no regional wall motion   abnormalities. -  Aortic valve: There was mild regurgitation. - Aortic root: The aortic root was mildly dilated. - Mitral valve: There was mild regurgitation. - Left atrium: The atrium was severely dilated. Impressions: - Definity used; normal LV systolic function; moderated LVH; mild   AI; mild MR; severe LAE.  Recent Labs: 10/27/2016: BUN 21; Creatinine, Ser 0.87; Hemoglobin 14.8; Magnesium 1.9; Platelets 180; Potassium 3.8; Sodium 142    Lipid Panel No results found for: CHOL, TRIG, HDL, CHOLHDL, VLDL, LDLCALC, LDLDIRECT   Wt Readings from Last 3 Encounters:  03/24/17 288 lb (130.6 kg)  10/27/16 271 lb 8 oz (123.2 kg)  07/23/16 266 lb (120.7 kg)  Other studies Reviewed: Additional studies/ records that were reviewed today include: office notes and testing.  ASSESSMENT AND PLAN:  1.  Chronic diastolic CHF: Pt weight is up, but he has also gained weight from excess calories. The relationship between his fluid intake, salt intake and CHF weight gain was reviewed. He will benefit from seeing a nutritionist. He needs options while traveling and eating out. Continue current Lasix dose and manage intake better.   2. Persistent atrial fib: he is in/out of this. Encouraged him to track sx and HR, let Dr Rayann Heman know how much time he is spending in atrial fib. No change in rx, will ck BMET and Mg, he may need supplements  3. Chronic anticoagulation: He is compliant w/ Xarelto, no bleeding issues.   Current medicines are reviewed at length with the patient today.  The patient does not have concerns regarding medicines.  The following changes have been made:  no change  Labs/ tests ordered today include:   Orders Placed This Encounter  Procedures  . Basic metabolic panel  . Magnesium  . Amb Referral to Nutrition and Diabetic E  . EKG 12-Lead     Disposition:   FU with Dr Gwenlyn Found and Dr Rayann Heman  Signed, Rosaria Ferries, PA-C  03/24/2017 10:07 PM    [place] Phone:  [phone]; Fax: [phone]  This note was written with the assistance of speech recognition software. Please excuse any transcriptional errors.

## 2017-03-25 LAB — BASIC METABOLIC PANEL
BUN/Creatinine Ratio: 25 — ABNORMAL HIGH (ref 10–24)
BUN: 30 mg/dL — AB (ref 8–27)
CALCIUM: 9.7 mg/dL (ref 8.6–10.2)
CO2: 25 mmol/L (ref 20–29)
CREATININE: 1.2 mg/dL (ref 0.76–1.27)
Chloride: 100 mmol/L (ref 96–106)
GFR calc Af Amer: 73 mL/min/{1.73_m2} (ref >59)
GFR, EST NON AFRICAN AMERICAN: 64 mL/min/{1.73_m2} (ref >59)
GLUCOSE: 96 mg/dL (ref 65–99)
Potassium: 3.8 mmol/L (ref 3.5–5.2)
SODIUM: 144 mmol/L (ref 134–144)

## 2017-03-25 LAB — MAGNESIUM: MAGNESIUM: 2 mg/dL (ref 1.6–2.3)

## 2017-03-27 ENCOUNTER — Telehealth: Payer: Self-pay | Admitting: *Deleted

## 2017-03-27 MED ORDER — KLOR-CON 10 10 MEQ PO TBCR: 10.0000 meq | EXTENDED_RELEASE_TABLET | Freq: Every day | ORAL | 6 refills | Status: DC

## 2017-03-27 NOTE — Telephone Encounter (Signed)
Results and recommendations discussed with patient, who verbalized understanding and thanks.  

## 2017-03-27 NOTE — Telephone Encounter (Signed)
-----   Message from Lonn [place], PA-C sent at 03/27/2017  9:03 AM EDT ----- Patient of Dr. Gwenlyn Found and Dr. Rayann Heman. Please let him know that his potassium was just a little below what we would like it because he is on Tikosyn. If he could take an extra potassium tablet on Monday Wednesday and Friday, I think that would be enough. Please make sure he is keeping a symptom diary of his atrial fib episodes and follow-up as scheduled. Thanks

## 2017-04-01 ENCOUNTER — Other Ambulatory Visit: Payer: Self-pay | Admitting: Orthopedic Surgery

## 2017-04-01 DIAGNOSIS — M5416 Radiculopathy, lumbar region: Secondary | ICD-10-CM

## 2017-04-12 ENCOUNTER — Ambulatory Visit
Admission: RE | Admit: 2017-04-12 | Discharge: 2017-04-12 | Disposition: A | Discharge status: Home / Self Care | Payer: BLUE CROSS/BLUE SHIELD | Source: Ambulatory Visit | Attending: Orthopedic Surgery | Admitting: Orthopedic Surgery

## 2017-04-12 DIAGNOSIS — M5416 Radiculopathy, lumbar region: Secondary | ICD-10-CM

## 2017-04-17 ENCOUNTER — Ambulatory Visit: Payer: BLUE CROSS/BLUE SHIELD | Admitting: Cardiovascular Disease

## 2017-06-30 ENCOUNTER — Ambulatory Visit: Payer: BLUE CROSS/BLUE SHIELD | Admitting: Cardiovascular Disease

## 2017-06-30 ENCOUNTER — Encounter: Payer: Self-pay | Admitting: Cardiovascular Disease

## 2017-06-30 DIAGNOSIS — G4733 Obstructive sleep apnea (adult) (pediatric): Secondary | ICD-10-CM

## 2017-06-30 DIAGNOSIS — I110 Hypertensive heart disease with heart failure: Secondary | ICD-10-CM

## 2017-06-30 DIAGNOSIS — I480 Paroxysmal atrial fibrillation: Secondary | ICD-10-CM | POA: Diagnosis not present

## 2017-06-30 NOTE — Patient Instructions (Signed)

## 2017-06-30 NOTE — Assessment & Plan Note (Signed)
History of essential hypertension blood pressure measures 142/86. He is on lisinopril and metoprolol. Continue current meds at current dosing.

## 2017-06-30 NOTE — Assessment & Plan Note (Signed)
History of paroxysmal atrial fibrillation on Xarelto and Tikosyn.

## 2017-06-30 NOTE — Progress Notes (Signed)
06/30/2017 [name]   [birth year]  [id number]  Primary Physician Seward Carol, MD Primary Cardiologist: Lorretta Harp MD [name], [place]  HPI:  [name] is a 66 y.o.  severely overweight married Caucasian male father of 41, grandfather to 3 grandchildren works as a Arts development officer. He was referred by Dr. Maudry Mayhew for evaluation of new-onset atrial fibrillation. I last saw him in the office 06/30/13 His factors include hypertension. He has obstructive sleep apnea on CPAP. He has had 3 right hip replacements in the past. He's never had a heart attack or stroke he denies chest pain or shortness of breath. Dr. Delfina Redwood began him on low-dose beta blockade. I began him on Xarelto oral anticoagulation. He'll ultimately underwent outpatient direct current cardioversion by Dr. Sallyanne Kuster with 1 shock of 120 J successfully to sinus rhythm. He ultimately reverted back to atrial fibrillation. A 2-D echocardiogram revealed normal LV systolic function with moderate to severe left atrial lodgment. A Myoview stress test suggested inferior scar without ischemia. He has seen Dr. Rayann Heman over the last 5 years and is currently on Tikosyn. He does go in and out of A. fib but is relatively asymptomatic.  Current Meds  Medication Sig  . dofetilide (TIKOSYN) 500 MCG capsule TAKE 1 CAPSULE BY MOUTH EVERY 12 HOURS  . furosemide (LASIX) 40 MG tablet Take 1 tablet (40 mg total) by mouth every other day. (Patient taking differently: Take 40 mg by mouth daily. )  . KLOR-CON 10 10 MEQ tablet Take 1 tablet (10 mEq total) by mouth daily. Take 1 extra tablet Mon, Wed, Fri  . lisinopril (PRINIVIL,ZESTRIL) 40 MG tablet Take 40 mg by mouth every morning.  . metoprolol succinate (TOPROL-XL) 50 MG 24 hr tablet TAKE 1 TABLET (50 MG TOTAL) BY MOUTH DAILY.  . Probiotic Product (PROBIOTIC PO) Take 1 capsule by mouth daily.  [name] 20 MG TABS tablet TAKE 1 TABLET (20 MG TOTAL) BY MOUTH DAILY.      Allergies  Allergen Reactions  . Oxycodone Rash    Can tolerate Hydrocodone    Social History   Socioeconomic History  . Marital status: Married    Spouse name: Not on file  . Number of children: Not on file  . Years of education: Not on file  . Highest education level: Not on file  Social Needs  . Financial resource strain: Not on file  . Food insecurity - worry: Not on file  . Food insecurity - inability: Not on file  . Transportation needs - medical: Not on file  . Transportation needs - non-medical: Not on file  Occupational History  . Occupation: Agricultural consultant  Tobacco Use  . Smoking status: Never Smoker  . Smokeless tobacco: Never Used  Substance and Sexual Activity  . Alcohol use: No  . Drug use: No  . Sexual activity: Yes  Other Topics Concern  . Not on file  Social History Narrative   Pt lives in [place] with spouse.  Leadership training and behavioral safety training.     Review of Systems: General: negative for chills, fever, night sweats or weight changes.  Cardiovascular: negative for chest pain, dyspnea on exertion, edema, orthopnea, palpitations, paroxysmal nocturnal dyspnea or shortness of breath Dermatological: negative for rash Respiratory: negative for cough or wheezing Urologic: negative for hematuria Abdominal: negative for nausea, vomiting, diarrhea, bright red blood per rectum, melena, or hematemesis Neurologic: negative for visual changes, syncope, or dizziness  All other systems reviewed and are otherwise negative except as noted above.    Blood pressure (!) 142/86, pulse 63, height 5' 10.5" (1.791 m), weight 280 lb (127 kg).  General appearance: alert and no distress Neck: no adenopathy, no carotid bruit, no JVD, supple, symmetrical, trachea midline and thyroid not enlarged, symmetric, no tenderness/mass/nodules Lungs: clear to auscultation bilaterally Heart: irregularly irregular rhythm Extremities:  extremities normal, atraumatic, no cyanosis or edema Pulses: 2+ and symmetric Skin: Skin color, texture, turgor normal. No rashes or lesions Neurologic: Alert and oriented X 3, normal strength and tone. Normal symmetric reflexes. Normal coordination and gait  EKG not performed today  ASSESSMENT AND PLAN:   Obstructive sleep apnea History of obstructive sleep apnea on CPAP  Atrial fibrillation History of paroxysmal atrial fibrillation on Xarelto and Tikosyn.  Hypertensive cardiovascular disease History of essential hypertension blood pressure measures 142/86. He is on lisinopril and metoprolol. Continue current meds at current dosing.      Lorretta Harp MD FACP,FACC,FAHA, [facility] 06/30/2017 9:51 AM

## 2017-06-30 NOTE — Assessment & Plan Note (Signed)
History of obstructive sleep apnea on CPAP. 

## 2017-08-12 ENCOUNTER — Ambulatory Visit: Payer: BLUE CROSS/BLUE SHIELD | Admitting: Internal Medicine

## 2017-08-26 ENCOUNTER — Ambulatory Visit: Payer: BLUE CROSS/BLUE SHIELD | Admitting: Internal Medicine

## 2017-08-26 ENCOUNTER — Encounter: Payer: Self-pay | Admitting: Internal Medicine

## 2017-08-26 ENCOUNTER — Encounter: Payer: Self-pay | Admitting: *Deleted

## 2017-08-26 VITALS — BP 142/88 | HR 90 | Ht 70.5 in | Wt 282.0 lb

## 2017-08-26 DIAGNOSIS — G4733 Obstructive sleep apnea (adult) (pediatric): Secondary | ICD-10-CM | POA: Diagnosis not present

## 2017-08-26 DIAGNOSIS — I480 Paroxysmal atrial fibrillation: Secondary | ICD-10-CM

## 2017-08-26 DIAGNOSIS — Z01812 Encounter for preprocedural laboratory examination: Secondary | ICD-10-CM | POA: Diagnosis not present

## 2017-08-26 DIAGNOSIS — I481 Persistent atrial fibrillation: Secondary | ICD-10-CM | POA: Diagnosis not present

## 2017-08-26 DIAGNOSIS — I4819 Other persistent atrial fibrillation: Secondary | ICD-10-CM

## 2017-08-26 NOTE — Progress Notes (Signed)
PCP: Seward Carol, MD Primary Cardiologist: Dr Gwenlyn Found Primary EP: Dr Rayann Heman  [name] is a 67 y.o. male who presents today for routine electrophysiology followup.  Since last being seen in our clinic, the patient reports doing reasonably well.  His afib has progressed.  He thinks that he has been persistently in afib since early January.  He has fatigue and decreased exercise tolerance.  Today, he denies symptoms of chest pain, shortness of breath,  lower extremity edema, dizziness, presyncope, or syncope.  The patient is otherwise without complaint today.   Past Medical History:  Diagnosis Date  . Arthritis    "joints" (09/26/2013)  . Atrial enlargement, left   . Hearing aid worn    both ears  . Hypertension   . Obesity   . OSA on CPAP    "mask adjusts to what I need" (09/26/2013)  . Persistent atrial fibrillation ([place])    cardioversion 06/06/13  . PONV (postoperative nausea and vomiting)    slow to wake up after  . Rotator cuff tear, right dec 2012   physical therapy done, decreased strength  . Wears glasses    Past Surgical History:  Procedure Laterality Date  . CARDIOVERSION N/A 06/06/2013   Procedure: CARDIOVERSION;  Surgeon: Sanda Klein, MD;  Location: [facility];  Service: Cardiovascular;  Laterality: N/A;  . CARDIOVERSION N/A 09/28/2013   Procedure: CARDIOVERSION BEDSIDE;  Surgeon: Peter M Martinique, MD;  Location: [facility];  Service: Cardiovascular;  Laterality: N/A;  . CARPAL TUNNEL RELEASE Right 08/08/2013   Procedure: RIGHT CARPAL TUNNEL RELEASE;  Surgeon: Wynonia Sours, MD;  Location: [place];  Service: Orthopedics;  Laterality: Right;  . CARPAL TUNNEL RELEASE Left 2008  . KNEE ARTHROSCOPY Right 1990's  . SHOULDER OPEN ROTATOR CUFF REPAIR Left 1990's  . SHOULDER OPEN ROTATOR CUFF REPAIR Right 11/2007  . TONSILLECTOMY  1950's  . TOTAL HIP ARTHROPLASTY Left 2010  . TOTAL HIP REVISION Left 10/08/2011  . TOTAL HIP REVISION  04/09/2012   Procedure: TOTAL HIP REVISION;  Surgeon: Gearlean Alf, MD;  Location: [facility];  Service: Orthopedics;  Laterality: Left;  Left Hip Acetabular Revision vs Constrained Liner  . TOTAL KNEE ARTHROPLASTY Right 2006   . TRIGGER FINGER RELEASE Right 08/08/2013   Procedure: RELEASE STENOSING TENOSYNOVITIS RIGHT THUMB;  Surgeon: Wynonia Sours, MD;  Location: [place] [facility];  Service: Orthopedics;  Laterality: Right;    ROS- all systems are reviewed and negatives except as per HPI above  Current Outpatient Medications  Medication Sig Dispense Refill  . dofetilide (TIKOSYN) 500 MCG capsule TAKE 1 CAPSULE BY MOUTH EVERY 12 HOURS 180 capsule 2  . furosemide (LASIX) 40 MG tablet Take 1 tablet (40 mg total) by mouth every other day. (Patient taking differently: Take 40 mg by mouth daily. ) 45 tablet 3  . KLOR-CON 10 10 MEQ tablet Take 1 tablet (10 mEq total) by mouth daily. Take 1 extra tablet Mon, Wed, Fri 48 tablet 6  . lisinopril (PRINIVIL,ZESTRIL) 40 MG tablet Take 40 mg by mouth every morning.    . metoprolol succinate (TOPROL-XL) 50 MG 24 hr tablet TAKE 1 TABLET (50 MG TOTAL) BY MOUTH DAILY. 30 tablet 1  . Probiotic Product (PROBIOTIC PO) Take 1 capsule by mouth daily.    [name] 20 MG TABS tablet TAKE 1 TABLET (20 MG TOTAL) BY MOUTH DAILY. 30 tablet 6   No current facility-administered medications for this visit.     Physical  Exam: Vitals:   08/26/17 1535  BP: (!) 142/88  Pulse: 90  Weight: 282 lb (127.9 kg)  Height: 5' 10.5" (1.791 m)    GEN- The patient is obese appearing, alert and oriented x 3 today.   Head- normocephalic, atraumatic Eyes-  Sclera clear, conjunctiva pink Ears- hearing intact Oropharynx- clear Lungs- Clear to ausculation bilaterally, normal work of breathing Heart- irregular rate and rhythm, no murmurs, rubs or gallops, PMI not laterally displaced GI- soft, NT, ND, + BS Extremities- no clubbing, cyanosis, or edema  EKG tracing ordered today is  personally reviewed and shows afib, V rate 90 bpm, Qtc 474 msec  Assessment and Plan:  1. Persistent afib He has severe LA enlargement (LA volume 139 ml).  Not surprising, he is back in afib today.  Therapeutic strategies for afib including medicine (amiodarone) and ablation were discussed in detail with the patient today. Risk, benefits, and alternatives to EP study and radiofrequency ablation for afib were also discussed in detail today. These risks include but are not limited to stroke, bleeding, vascular damage, tamponade, perforation, damage to the esophagus, lungs, and other structures, pulmonary vein stenosis, worsening renal function, and death. The patient understands these risk and wishes to proceed.  We will therefore proceed with catheter ablation at the next available time.  Will plan TEE prior to ablation.  Stop tikosyn today per patients request  2. OSA Compliance with CPAP is encouraged  3. Hypertensive cardiovascular disaese Stable No change required today  4. Obesity Body mass index is 39.89 kg/m. I have reviewed the patients BMI and decreased success rates with ablation at length today.  Weight loss is strongly advised.  Per Guijian et al (PACE 2013; 36: 962-229), patients with BMI 25-29.9 (obese) have a 27% increase in AF recurrence post ablation.  Patients with BMI >30 have a 31% increase in AF recurrence post ablation when compared to those with BMI <25.  He does not wish to be seen in AF clinic.  Will plan post procedure follow-up with me.  Thompson Grayer MD, [facility] 08/26/2017 3:45 PM

## 2017-08-26 NOTE — Patient Instructions (Addendum)
Medication Instructions:  Your physician has recommended you make the following change in your medication:  1. STOP Tikosyn  * If you need a refill on your cardiac medications before your next appointment, please call your pharmacy. *  Labwork: Your physician recommends that you return for lab work between: 09/08/17 - 09/18/17  Testing/Procedures: Your physician has requested that you have a TEE. During a TEE, sound waves are used to create images of your heart. It provides your doctor with information about the size and shape of your heart and how well your heart's chambers and valves are working. In this test, a transducer is attached to the end of a flexible tube that's guided down your throat and into your esophagus (the tube leading from you mouth to your stomach) to get a more detailed image of your heart. You are not awake for the procedure. Please see the instruction sheet given to you today. For further information please visit [email or web address].  Your physician has recommended that you have an ablation. Catheter ablation is a medical procedure used to treat some cardiac arrhythmias (irregular heartbeats). During catheter ablation, a long, thin, flexible tube is put into a blood vessel in your groin (upper thigh), or neck. This tube is called an ablation catheter. It is then guided to your heart through the blood vessel. Radio frequency waves destroy small areas of heart tissue where abnormal heartbeats may cause an arrhythmia to start. Please see the instruction sheet given to you today.  Follow-Up: Your physician recommends that you schedule a follow-up appointment in: 4 weeks, after your procedure on 09/22/2017, with Dr. Rayann Heman.  Your physician recommends that you schedule a follow-up appointment in: 3 months, after your procedure on 09/22/2017, with Dr. Rayann Heman.  Thank you for choosing [facility]!!     Any Other Special Instructions Will Be Listed Below (If  Applicable).   Transesophageal Echocardiogram Transesophageal echocardiography (TEE) is a picture test of your heart using sound waves. The pictures taken can give very detailed pictures of your heart. This can help your doctor see if there are problems with your heart. TEE can check:  If your heart has blood clots in it.  How well your heart valves are working.  If you have an infection on the inside of your heart.  Some of the major arteries of your heart.  If your heart valve is working after a [employer].  Your heart before a procedure that uses a shock to your heart to get the rhythm back to normal.  What happens before the procedure?  Do not eat or drink for 6 hours before the procedure or as told by your doctor.  Make plans to have someone drive you home after the procedure. Do not drive yourself home.  An IV tube will be put in your arm. What happens during the procedure?  You will be given a medicine to help you relax (sedative). It will be given through the IV tube.  A numbing medicine will be sprayed or gargled in the back of your throat to help numb it.  The tip of the probe is placed into the back of your mouth. You will be asked to swallow. This helps to pass the probe into your esophagus.  Once the tip of the probe is in the right place, your doctor can take pictures of your heart.  You may feel pressure at the back of your throat. What happens after the procedure?  You will be taken to a recovery  area so the sedative can wear off.  Your throat may be sore and scratchy. This will go away slowly over time.  You will go home when you are fully awake and able to swallow liquids.  You should have someone stay with you for the next 24 hours.  Do not drive or operate machinery for the next 24 hours. This information is not intended to replace advice given to you by your health care provider. Make sure you discuss any questions you have with your health care  provider. Document Released: 04/27/2009 Document Revised: 12/06/2015 Document Reviewed: 12/30/2012 Elsevier Interactive Patient Education  2018 [employer].   Cardiac Ablation Cardiac ablation is a procedure to disable (ablate) a small amount of heart tissue in very specific places. The heart has many electrical connections. Sometimes these connections are abnormal and can cause the heart to beat very fast or irregularly. Ablating some of the problem areas can improve the heart rhythm or return it to normal. Ablation may be done for people who:  Have Wolff-Parkinson-White syndrome.  Have fast heart rhythms (tachycardia).  Have taken medicines for an abnormal heart rhythm (arrhythmia) that were not effective or caused side effects.  Have a high-risk heartbeat that may be life-threatening.  During the procedure, a small incision is made in the neck or the groin, and a long, thin, flexible tube (catheter) is inserted into the incision and moved to the heart. Small devices (electrodes) on the tip of the catheter will send out electrical currents. A type of X-ray (fluoroscopy) will be used to help guide the catheter and to provide images of the heart. Tell a health care provider about:  Any allergies you have.  All medicines you are taking, including vitamins, herbs, eye drops, creams, and over-the-counter medicines.  Any problems you or family members have had with anesthetic medicines.  Any blood disorders you have.  Any surgeries you have had.  Any medical conditions you have, such as kidney failure.  Whether you are pregnant or may be pregnant. What are the risks? Generally, this is a safe procedure. However, problems may occur, including:  Infection.  Bruising and bleeding at the catheter insertion site.  Bleeding into the chest, especially into the sac that surrounds the heart. This is a serious complication.  Stroke or blood clots.  Damage to other structures or  organs.  Allergic reaction to medicines or dyes.  Need for a permanent pacemaker if the normal electrical system is damaged. A pacemaker is a small computer that sends electrical signals to the heart and helps your heart beat normally.  The procedure not being fully effective. This may not be recognized until months later. Repeat ablation procedures are sometimes required.  What happens before the procedure?  Follow instructions from your health care provider about eating or drinking restrictions.  Ask your health care provider about: ? Changing or stopping your regular medicines. This is especially important if you are taking diabetes medicines or blood thinners. ? Taking medicines such as aspirin and ibuprofen. These medicines can thin your blood. Do not take these medicines before your procedure if your health care provider instructs you not to.  Plan to have someone take you home from the hospital or clinic.  If you will be going home right after the procedure, plan to have someone with you for 24 hours. What happens during the procedure?  To lower your risk of infection: ? Your health care team will wash or sanitize their hands. ? Your  skin will be washed with soap. ? Hair may be removed from the incision area.  An IV tube will be inserted into one of your veins.  You will be given a medicine to help you relax (sedative).  The skin on your neck or groin will be numbed.  An incision will be made in your neck or your groin.  A needle will be inserted through the incision and into a large vein in your neck or groin.  A catheter will be inserted into the needle and moved to your heart.  Dye may be injected through the catheter to help your surgeon see the area of the heart that needs treatment.  Electrical currents will be sent from the catheter to ablate heart tissue in desired areas. There are three types of energy that may be used to ablate heart tissue: ? Heat  (radiofrequency energy). ? Laser energy. ? Extreme cold (cryoablation).  When the necessary tissue has been ablated, the catheter will be removed.  Pressure will be held on the catheter insertion area to prevent excessive bleeding.  A bandage (dressing) will be placed over the catheter insertion area. The procedure may vary among health care providers and hospitals. What happens after the procedure?  Your blood pressure, heart rate, breathing rate, and blood oxygen level will be monitored until the medicines you were given have worn off.  Your catheter insertion area will be monitored for bleeding. You will need to lie still for a few hours to ensure that you do not bleed from the catheter insertion area.  Do not drive for 24 hours or as long as directed by your health care provider. Summary  Cardiac ablation is a procedure to disable (ablate) a small amount of heart tissue in very specific places. Ablating some of the problem areas can improve the heart rhythm or return it to normal.  During the procedure, electrical currents will be sent from the catheter to ablate heart tissue in desired areas. This information is not intended to replace advice given to you by your health care provider. Make sure you discuss any questions you have with your health care provider. Document Released: 11/16/2008 Document Revised: 05/19/2016 Document Reviewed: 05/19/2016 Elsevier Interactive Patient Education  [employer].

## 2017-08-31 ENCOUNTER — Telehealth: Payer: Self-pay | Admitting: Internal Medicine

## 2017-08-31 NOTE — Telephone Encounter (Signed)
Pt called to cancelled his ablation 3/12 and the F/U visit after ablation. Pt states that he is getting a new CPAP machine and he wants to see if that helps instead.  Santiago Glad at EP lab was notified.

## 2017-08-31 NOTE — Telephone Encounter (Signed)
New message     Patient is cancelling ablation on 3/11-3/12 he is getting new cpap machine and he wants to see if that helps instead.

## 2017-09-03 DIAGNOSIS — R69 Illness, unspecified: Secondary | ICD-10-CM | POA: Diagnosis not present

## 2017-09-08 ENCOUNTER — Other Ambulatory Visit: Payer: BLUE CROSS/BLUE SHIELD

## 2017-09-09 DIAGNOSIS — R69 Illness, unspecified: Secondary | ICD-10-CM | POA: Diagnosis not present

## 2017-09-21 ENCOUNTER — Ambulatory Visit (HOSPITAL_COMMUNITY): Admission: RE | Admit: 2017-09-21 | Payer: BLUE CROSS/BLUE SHIELD | Source: Ambulatory Visit | Admitting: Cardiology

## 2017-09-21 ENCOUNTER — Encounter (HOSPITAL_COMMUNITY): Admission: RE | Payer: Self-pay | Source: Ambulatory Visit

## 2017-09-21 SURGERY — ECHOCARDIOGRAM, TRANSESOPHAGEAL
Anesthesia: Moderate Sedation

## 2017-09-22 ENCOUNTER — Ambulatory Visit (HOSPITAL_COMMUNITY): Admit: 2017-09-22 | Payer: BLUE CROSS/BLUE SHIELD | Admitting: Internal Medicine

## 2017-09-22 ENCOUNTER — Encounter (HOSPITAL_COMMUNITY): Payer: Self-pay

## 2017-09-22 SURGERY — ATRIAL FIBRILLATION ABLATION
Anesthesia: Monitor Anesthesia Care

## 2017-10-09 ENCOUNTER — Other Ambulatory Visit: Payer: Self-pay | Admitting: Internal Medicine

## 2017-10-12 ENCOUNTER — Other Ambulatory Visit: Payer: Self-pay

## 2017-10-12 MED ORDER — KLOR-CON 10 10 MEQ PO TBCR: 10.0000 meq | EXTENDED_RELEASE_TABLET | Freq: Every day | ORAL | 6 refills | Status: DC

## 2017-10-21 ENCOUNTER — Ambulatory Visit: Payer: BLUE CROSS/BLUE SHIELD | Admitting: Internal Medicine

## 2017-12-09 DIAGNOSIS — I10 Essential (primary) hypertension: Secondary | ICD-10-CM | POA: Diagnosis not present

## 2017-12-09 DIAGNOSIS — R05 Cough: Secondary | ICD-10-CM | POA: Diagnosis not present

## 2017-12-09 DIAGNOSIS — G4733 Obstructive sleep apnea (adult) (pediatric): Secondary | ICD-10-CM | POA: Diagnosis not present

## 2017-12-09 DIAGNOSIS — I480 Paroxysmal atrial fibrillation: Secondary | ICD-10-CM | POA: Diagnosis not present

## 2017-12-23 ENCOUNTER — Ambulatory Visit: Payer: BLUE CROSS/BLUE SHIELD | Admitting: Internal Medicine

## 2018-01-01 ENCOUNTER — Encounter: Payer: Self-pay | Admitting: Internal Medicine

## 2018-01-04 ENCOUNTER — Encounter: Payer: Self-pay | Admitting: Internal Medicine

## 2018-01-04 ENCOUNTER — Encounter (INDEPENDENT_AMBULATORY_CARE_PROVIDER_SITE_OTHER): Payer: Self-pay

## 2018-01-04 ENCOUNTER — Ambulatory Visit: Payer: Medicare HMO | Admitting: Internal Medicine

## 2018-01-04 VITALS — BP 132/80 | HR 105 | Ht 70.5 in | Wt 274.0 lb

## 2018-01-04 DIAGNOSIS — I480 Paroxysmal atrial fibrillation: Secondary | ICD-10-CM

## 2018-01-04 DIAGNOSIS — I481 Persistent atrial fibrillation: Secondary | ICD-10-CM | POA: Diagnosis not present

## 2018-01-04 DIAGNOSIS — I517 Cardiomegaly: Secondary | ICD-10-CM | POA: Diagnosis not present

## 2018-01-04 DIAGNOSIS — I4819 Other persistent atrial fibrillation: Secondary | ICD-10-CM

## 2018-01-04 DIAGNOSIS — G4733 Obstructive sleep apnea (adult) (pediatric): Secondary | ICD-10-CM

## 2018-01-04 DIAGNOSIS — I110 Hypertensive heart disease with heart failure: Secondary | ICD-10-CM | POA: Diagnosis not present

## 2018-01-04 NOTE — Patient Instructions (Addendum)
Medication Instructions:  Your physician recommends that you continue on your current medications as directed. Please refer to the Current Medication list given to you today.  Labwork: None ordered  Testing/Procedures: None ordered  Follow-Up: You have been referred to Dr. Roxy Manns to discuss MAZE procedure.   * If you need a refill on your cardiac medications before your next appointment, please call your pharmacy.    Thank you for choosing [facility]!!

## 2018-01-04 NOTE — Progress Notes (Signed)
PCP: Seward Carol, MD Primary Cardiologist: Dr Gwenlyn Found Primary EP: Dr Rayann Heman  [name] is a 67 y.o. male who presents today for routine electrophysiology followup.  Since last being seen in our clinic, the patient reports doing reasonably well. He was planned for afib ablation but cancelled the procedure.  He continues to have fatigue and decreased exercise tolerance with his afib. Today, he denies symptoms of palpitations, chest pain, shortness of breath,  lower extremity edema, dizziness, presyncope, or syncope.  The patient is otherwise without complaint today.   Past Medical History:  Diagnosis Date  . Arthritis    "joints" (09/26/2013)  . Atrial enlargement, left   . Hearing aid worn    both ears  . Hypertension   . Obesity   . OSA on CPAP    "mask adjusts to what I need" (09/26/2013)  . Persistent atrial fibrillation ([place])    cardioversion 06/06/13  . Rotator cuff tear, right dec 2012   physical therapy done, decreased strength  . Wears glasses    Past Surgical History:  Procedure Laterality Date  . CARDIOVERSION N/A 06/06/2013   Procedure: CARDIOVERSION;  Surgeon: Sanda Klein, MD;  Location: [facility];  Service: Cardiovascular;  Laterality: N/A;  . CARDIOVERSION N/A 09/28/2013   Procedure: CARDIOVERSION BEDSIDE;  Surgeon: Peter M Martinique, MD;  Location: [facility];  Service: Cardiovascular;  Laterality: N/A;  . CARPAL TUNNEL RELEASE Right 08/08/2013   Procedure: RIGHT CARPAL TUNNEL RELEASE;  Surgeon: Wynonia Sours, MD;  Location: [facility];  Service: Orthopedics;  Laterality: Right;  . CARPAL TUNNEL RELEASE Left 2008  . KNEE ARTHROSCOPY Right 1990's  . SHOULDER OPEN ROTATOR CUFF REPAIR Left 1990's  . SHOULDER OPEN ROTATOR CUFF REPAIR Right 11/2007  . TONSILLECTOMY  1950's  . TOTAL HIP ARTHROPLASTY Left 2010  . TOTAL HIP REVISION Left 10/08/2011  . TOTAL HIP REVISION  04/09/2012   Procedure: TOTAL HIP REVISION;  Surgeon: Gearlean Alf, MD;  Location:  [facility];  Service: Orthopedics;  Laterality: Left;  Left Hip Acetabular Revision vs Constrained Liner  . TOTAL KNEE ARTHROPLASTY Right 2006   . TRIGGER FINGER RELEASE Right 08/08/2013   Procedure: RELEASE STENOSING TENOSYNOVITIS RIGHT THUMB;  Surgeon: Wynonia Sours, MD;  Location: [facility];  Service: Orthopedics;  Laterality: Right;    ROS- all systems are reviewed and negatives except as per HPI above  Current Outpatient Medications  Medication Sig Dispense Refill  . furosemide (LASIX) 40 MG tablet Take 1 tablet (40 mg total) by mouth every other day. (Patient taking differently: Take 40 mg by mouth daily. ) 45 tablet 3  . KLOR-CON 10 10 MEQ tablet Take 1 tablet (10 mEq total) by mouth daily. Take 1 extra tablet Mon, Wed, Fri 48 tablet 6  . lisinopril (PRINIVIL,ZESTRIL) 40 MG tablet Take 40 mg by mouth every morning.    . metoprolol succinate (TOPROL-XL) 50 MG 24 hr tablet TAKE 1 TABLET (50 MG TOTAL) BY MOUTH DAILY. 30 tablet 1  . Probiotic Product (PROBIOTIC PO) Take 1 capsule by mouth daily.    [name] 20 MG TABS tablet TAKE 1 TABLET (20 MG TOTAL) BY MOUTH DAILY. 30 tablet 6   No current facility-administered medications for this visit.     Physical Exam: Vitals:   01/04/18 1452  BP: 132/80  Pulse: (!) 105  Weight: 274 lb (124.3 kg)  Height: 5' 10.5" (1.791 m)    GEN- The patient is well appearing, alert  and oriented x 3 today.   Head- normocephalic, atraumatic Eyes-  Sclera clear, conjunctiva pink Ears- hearing intact Oropharynx- clear Lungs- Clear to ausculation bilaterally, normal work of breathing Heart- irregular rate and rhythm, no murmurs, rubs or gallops, PMI not laterally displaced GI- soft, NT, ND, + BS Extremities- no clubbing, cyanosis, or edema  Wt Readings from Last 3 Encounters:  01/04/18 274 lb (124.3 kg)  08/26/17 282 lb (127.9 kg)  06/30/17 280 lb (127 kg)    EKG tracing ordered today is personally reviewed and shows afib, V rate 105  bpm, nonspecific St/T changes  Assessment and Plan:  1. Longstanding persistent afib with severe LA enlargement. He is symptomatic.  I had previously offered ablation, though with anticipated reduced success rates.  He subsequently cancelled the procedure. He presents today stating that he has read information from [facility] about MAZE.  We did discuss this today in the office.  We discussed that anticipated success with MAZE would likely be higher for him that traditional PVI.  As he is quite motivated, I will refer him to Dr Roxy Manns for further consultation. If he decides to proceed, he will likely require cath.  I would defer this however until he has had preliminary discussions with Dr Roxy Manns.  2. HTN Stable No change required today  3. OSA Compliant with CPAP  4. Obesity Body mass index is 38.76 kg/m. Wt Readings from Last 3 Encounters:  01/04/18 274 lb (124.3 kg)  08/26/17 282 lb (127.9 kg)  06/30/17 280 lb (127 kg)   He is making some progress.  Today, I have spent 40 minutes with the patient discussing afib management and MAZE.  More than 50% of the visit time today was spent on this issue.   Thompson Grayer MD, [facility] 01/05/2018 9:01 PM

## 2018-01-05 ENCOUNTER — Encounter: Payer: Self-pay | Admitting: Internal Medicine

## 2018-01-06 NOTE — Addendum Note (Signed)
Addended by: Marlis Edelson C on: 01/06/2018 03:56 PM   Modules accepted: Orders

## 2018-01-18 DIAGNOSIS — R69 Illness, unspecified: Secondary | ICD-10-CM | POA: Diagnosis not present

## 2018-01-21 ENCOUNTER — Telehealth: Payer: Self-pay | Admitting: Internal Medicine

## 2018-01-21 NOTE — Telephone Encounter (Signed)
New Message       [facility] Pre-operative Risk Assessment    Request for surgical clearance:  1. What type of surgery is being performed? colonoscopy history of polyps   2. When is this surgery scheduled? 02/15/18  3. What type of clearance is required (medical clearance vs. Pharmacy clearance to hold med vs. Both)? pharmacy  4. Are there any medications that need to be held prior to surgery and how long? Xarelto 2-3 days prior to procedure  And does he need to stop for a number of days if a biopsy is taken  5. Practice name and name of physician performing surgery? [facility]/ Dr. Paulita Fujita  6. What is your office phone number [phone] Please call with answer   7.   What is your office fax number [phone]  8.   Anesthesia type (None, local, MAC, general) ? propofol   [name] 01/21/2018, 4:01 PM  _________________________________________________________________   (provider comments below)

## 2018-01-22 NOTE — Telephone Encounter (Signed)
Pt takes Xarelto for afib with CHADS2VASc score of 2 (age, HTN). CrCl > 100. Ok to hold Xarelto for 1-2 days prior to procedure and resume as soon as it's safe after.

## 2018-01-25 NOTE — Telephone Encounter (Signed)
Routing pharmacy recommendations for the Xarelto to Dr. Paulita Fujita.  Burtis Junes, RN, [place] [address] [place], [place]  [id number] [phone]

## 2018-02-02 ENCOUNTER — Encounter: Payer: Self-pay | Admitting: Thoracic Surgery (Cardiothoracic Vascular Surgery)

## 2018-02-02 ENCOUNTER — Institutional Professional Consult (permissible substitution): Payer: Medicare HMO | Admitting: Thoracic Surgery (Cardiothoracic Vascular Surgery)

## 2018-02-02 VITALS — BP 160/90 | HR 75 | Resp 20 | Ht 70.5 in | Wt 276.0 lb

## 2018-02-02 DIAGNOSIS — I481 Persistent atrial fibrillation: Secondary | ICD-10-CM | POA: Diagnosis not present

## 2018-02-02 DIAGNOSIS — E669 Obesity, unspecified: Secondary | ICD-10-CM | POA: Insufficient documentation

## 2018-02-02 DIAGNOSIS — I4819 Other persistent atrial fibrillation: Secondary | ICD-10-CM

## 2018-02-02 DIAGNOSIS — I5032 Chronic diastolic (congestive) heart failure: Secondary | ICD-10-CM | POA: Insufficient documentation

## 2018-02-02 NOTE — Patient Instructions (Signed)
Continue all previous medications without any changes at this time  

## 2018-02-02 NOTE — Progress Notes (Signed)
[address]       [place] [address]             [facility] REPORT  Referring Provider is Thompson Grayer, MD  Primary Cardiologist is Lorretta Harp, MD PCP is Seward Carol, MD  Chief Complaint  Patient presents with  . Atrial Fibrillation    Surgical eval for MAZE procedure, ECHO 11/07/17    HPI:  Patient is a 67 year old obese male with history of chronic atrial fibrillation that is now persistent, hypertension, chronic diastolic congestive heart failure, and obstructive sleep apnea who has been referred for surgical consultation to discuss treatment options for management of persistent atrial fibrillation.  The patient states that he was first diagnosed with atrial fibrillation approximately 5 years ago.  He was initially evaluated by Dr. Alvester Chou and eventually referred to Dr. Rayann Heman for management.  He was managed with Tikosyn successfully for approximately 2 years, but he then began to develop recurrent episodes of paroxysmal atrial fibrillation that increased in frequency and duration.  He eventually developed persistent atrial fibrillation and underwent cardioversion on 2 occasions in 2014 and 2015.  Nuclear stress test performed April 2018 revealed moderate left ventricular chamber enlargement with no evidence for myocardial ischemia.  Echocardiogram performed in April 2018 revealed normal left ventricular systolic function with moderate left ventricular hypertrophy.  There was mild aortic insufficiency, mild mitral regurgitation, and severe left atrial enlargement reported.  He has remained in persistent atrial fibrillation since last fall without any spontaneous recurrence to sinus rhythm.  He was seen in follow-up recently by Dr. Rayann Heman and long-term treatment options were discussed.  Catheter based ablation was discussed as an alternative but felt likely to be associated with diminished long-term success because  of severe left atrial enlargement noted on echocardiogram in the setting of obesity with obstructive sleep apnea.  Cardiothoracic surgical consultation was requested to discuss surgical options including the Maze procedure.  The patient is married and lives locally in [place] with his wife.  He is originally from [place].  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  He is currently semiretired and spends most of his time locally at his home.  He enjoys working around the house out of doors.  He does not exercise on a regular basis.  He has been overweight all of his adult life.  He describes stable symptoms of exertional shortness of breath and fatigue did seem to wax and wane in severity.  He denies any history of resting shortness of breath, PND, orthopnea, or lower extremity edema.  He used to experience symptoms of palpitations when he would go in and out of sinus rhythm but he no longer experiences palpitations.  He denies any exertional chest pain or chest tightness.  He reports occasional transient episodes of tightness across his chest that do not seem to be related to activity.  He has been chronically anticoagulated using Xarelto for approximately 5 years without any history of bleeding complications, TIA, or stroke.  Past Medical History:  Diagnosis Date  . Arthritis    "joints" (09/26/2013)  . Atrial enlargement, left   . Chronic diastolic congestive heart failure ([facility])   . Hearing aid worn    both ears  . Hypertension   . Obesity   . OSA on CPAP    "  mask adjusts to what I need" (09/26/2013)  . Persistent atrial fibrillation ([facility])    cardioversion 06/06/13  . Rotator cuff tear, right dec 2012   physical therapy done, decreased strength  . Wears glasses     Past Surgical History:  Procedure Laterality Date  . CARDIOVERSION N/A 06/06/2013   Procedure: CARDIOVERSION;   Surgeon: Sanda Klein, MD;  Location: [facility];  Service: Cardiovascular;  Laterality: N/A;  . CARDIOVERSION N/A 09/28/2013   Procedure: CARDIOVERSION BEDSIDE;  Surgeon: Peter M Martinique, MD;  Location: [facility];  Service: Cardiovascular;  Laterality: N/A;  . CARPAL TUNNEL RELEASE Right 08/08/2013   Procedure: RIGHT CARPAL TUNNEL RELEASE;  Surgeon: Wynonia Sours, MD;  Location: [facility];  Service: Orthopedics;  Laterality: Right;  . CARPAL TUNNEL RELEASE Left 2008  . KNEE ARTHROSCOPY Right 1990's  . SHOULDER OPEN ROTATOR CUFF REPAIR Left 1990's  . SHOULDER OPEN ROTATOR CUFF REPAIR Right 11/2007  . TONSILLECTOMY  1950's  . TOTAL HIP ARTHROPLASTY Left 2010  . TOTAL HIP REVISION Left 10/08/2011  . TOTAL HIP REVISION  04/09/2012   Procedure: TOTAL HIP REVISION;  Surgeon: Gearlean Alf, MD;  Location: [facility];  Service: Orthopedics;  Laterality: Left;  Left Hip Acetabular Revision vs Constrained Liner  . TOTAL KNEE ARTHROPLASTY Right 2006   . TRIGGER FINGER RELEASE Right 08/08/2013   Procedure: RELEASE STENOSING TENOSYNOVITIS RIGHT THUMB;  Surgeon: Wynonia Sours, MD;  Location: [facility];  Service: Orthopedics;  Laterality: Right;    Family History  Problem Relation Age of Onset  . Other Unknown        mother had a pacemaker    Social History   Socioeconomic History  . Marital status: Married    Spouse name: Not on file  . Number of children: Not on file  . Years of education: Not on file  . Highest education level: Not on file  Occupational History  . Occupation: Agricultural consultant  Social Needs  . Financial resource strain: Not on file  . Food insecurity:    Worry: Not on file    Inability: Not on file  . Transportation needs:    Medical: Not on file    Non-medical: Not on file  Tobacco Use  . Smoking status: Never Smoker  . Smokeless tobacco: Never Used  Substance and Sexual Activity  . Alcohol use: No  . Drug use: No  .  Sexual activity: Yes  Lifestyle  . Physical activity:    Days per week: Not on file    Minutes per session: Not on file  . Stress: Not on file  Relationships  . Social connections:    Talks on phone: Not on file    Gets together: Not on file    Attends religious service: Not on file    Active member of club or organization: Not on file    Attends meetings of clubs or organizations: Not on file    Relationship status: Not on file  . Intimate partner violence:    Fear of current or ex partner: Not on file    Emotionally abused: Not on file    Physically abused: Not on file    Forced sexual activity: Not on file  Other Topics Concern  . Not on file  Social History Narrative   Pt lives in [place] with spouse.  Leadership training and behavioral safety training.    Current Outpatient Medications  Medication Sig Dispense Refill  .  furosemide (LASIX) 40 MG tablet Take 1 tablet (40 mg total) by mouth every other day. (Patient taking differently: Take 40 mg by mouth daily. ) 45 tablet 3  . KLOR-CON 10 10 MEQ tablet Take 1 tablet (10 mEq total) by mouth daily. Take 1 extra tablet Mon, Wed, Fri 48 tablet 6  . lisinopril (PRINIVIL,ZESTRIL) 40 MG tablet Take 40 mg by mouth every morning.    . metoprolol succinate (TOPROL-XL) 50 MG 24 hr tablet TAKE 1 TABLET (50 MG TOTAL) BY MOUTH DAILY. 30 tablet 1  . Probiotic Product (PROBIOTIC PO) Take 1 capsule by mouth daily.    [name] 20 MG TABS tablet TAKE 1 TABLET (20 MG TOTAL) BY MOUTH DAILY. 30 tablet 6   No current facility-administered medications for this visit.     Allergies  Allergen Reactions  . Oxycodone Rash    Can tolerate Hydrocodone      Review of Systems:   General:  normal appetite, decreased energy, no weight gain, no weight loss, no fever  Cardiac:  no chest pain with exertion, occasional fleeting episodes of chest pain at rest, + SOB with exertion, no resting SOB, no PND, no orthopnea, no palpitations, + arrhythmia,  + atrial fibrillation, no LE edema, no dizzy spells, no syncope  Respiratory:  +  shortness of breath, no home oxygen, no productive cough, no dry cough, no bronchitis, no wheezing, no hemoptysis, no asthma, no pain with inspiration or cough, + sleep apnea, + CPAP at night  GI:   no difficulty swallowing, no reflux, no frequent heartburn, no hiatal hernia, no abdominal pain, no constipation, no diarrhea, no hematochezia, no hematemesis, no melena  GU:   no dysuria,  no frequency, no urinary tract infection, no hematuria, no enlarged prostate, no kidney stones, no kidney disease  Vascular:  no pain suggestive of claudication, no pain in feet, no leg cramps, no varicose veins, no DVT, no non-healing foot ulcer  Neuro:   no stroke, no TIA's, no seizures, no headaches, no temporary blindness one eye,  no slurred speech, no peripheral neuropathy, no chronic pain, no instability of gait, no memory/cognitive dysfunction  Musculoskeletal: + arthritis, no joint swelling, no myalgias, no difficulty walking, normal mobility   Skin:   hno rash, no itching, no skin infections, no pressure sores or ulcerations  Psych:   no anxiety, no depression, no nervousness, no unusual recent stress  Eyes:   no blurry vision, no floaters, no recent vision changes, + wears glasses or contacts  ENT:   no hearing loss, no loose or painful teeth, no dentures, last saw dentist within the past 2 months  Hematologic:  no easy bruising, no abnormal bleeding, no clotting disorder, no frequent epistaxis  Endocrine:  no diabetes, does not check CBG's at home     Physical Exam:   BP (!) 160/90   Pulse 75   Resp 20   Ht 5' 10.5" (1.791 m)   Wt 276 lb (125.2 kg)   SpO2 96% Comment: RA  BMI 39.04 kg/m   General:  Obese,  well-appearing  HEENT:  Unremarkable   Neck:   no JVD, no bruits, no adenopathy   Chest:   clear to auscultation, symmetrical breath sounds, no wheezes, no rhonchi   CV:   Irregular rate and rhythm, no murmur    Abdomen:  soft, non-tender, no masses   Extremities:  warm, well-perfused, pulses palpable, no LE edema  Rectal/GU  Deferred  Neuro:   Grossly non-focal  and symmetrical throughout  Skin:   Clean and dry, no rashes, no breakdown   Diagnostic Tests:  Transthoracic Echocardiography  Patient:    Tomoki, Lucken MR #:       [id number] Study Date: 11/07/2016 Gender:     M Age:        67 Height:     177.8 cm Weight:     123.2 kg BSA:        2.52 m^2 Pt. Status: Room:   ATTENDING    Kirk Ruths  SONOGRAPHER  Diamond Nickel  ORDERING     Thompson Grayer, MD  REFERRING    Thompson Grayer, MD  PERFORMING   [facility], Outpatient  cc:  ------------------------------------------------------------------- LV EF: 55% -   60%  ------------------------------------------------------------------- Indications:      Atrial fibrillation - I48.91.  ------------------------------------------------------------------- History:   PMH:  Obesity. Obstructive sleep apnea (uses CPAP). Left atrial enlargement.  Risk factors:  Hypertension.  ------------------------------------------------------------------- Study Conclusions  - Left ventricle: The cavity size was normal. Wall thickness was   increased in a pattern of moderate LVH. There was mild focal   basal hypertrophy of the septum. Systolic function was normal.   The estimated ejection fraction was in the range of 55% to 60%.   Wall motion was normal; there were no regional wall motion   abnormalities. - Aortic valve: There was mild regurgitation. - Aortic root: The aortic root was mildly dilated. - Mitral valve: There was mild regurgitation. - Left atrium: The atrium was severely dilated.  Impressions:  - Definity used; normal LV systolic function; moderated LVH; mild   AI; mild MR; severe LAE.  ------------------------------------------------------------------- Study data:  Comparison was made to the study of 12/17/2015.   Study status:  Routine.  Procedure:  The patient reported no pain pre or post test. Transthoracic echocardiography. Image quality was adequate. The study was technically difficult, as a result of body habitus. Intravenous contrast (Definity) was administered.  Study completion:  There were no complications.          Transthoracic echocardiography.  M-mode, complete 2D, spectral Doppler, and color Doppler.  Birthdate:  Patient birthdate: [birth year].  Age:  Patient is 67 yr old.  Sex:  Gender: male.    BMI: 39 kg/m^2.  Blood pressure:     140/82  Patient status:  Outpatient.  Study date: Study date: 11/07/2016. Study time: 07:31 AM.  Location:  [facility]  -------------------------------------------------------------------  ------------------------------------------------------------------- Left ventricle:  The cavity size was normal. Wall thickness was increased in a pattern of moderate LVH. There was mild focal basal hypertrophy of the septum. Systolic function was normal. The estimated ejection fraction was in the range of 55% to 60%. Wall motion was normal; there were no regional wall motion abnormalities. The study was not technically sufficient to allow evaluation of LV diastolic dysfunction due to atrial fibrillation.   ------------------------------------------------------------------- Aortic valve:   Trileaflet; mildly thickened leaflets. Mobility was not restricted.  Doppler:  Transvalvular velocity was within the normal range. There was no stenosis. There was mild regurgitation.   ------------------------------------------------------------------- Aorta:  Aortic root: The aortic root was mildly dilated.  ------------------------------------------------------------------- Mitral valve:   Mildly thickened leaflets . Mobility was not restricted.  Doppler:  Transvalvular velocity was within the normal range. There was no evidence for stenosis. There was  mild regurgitation.    Peak gradient (D): 4 mm Hg.  ------------------------------------------------------------------- Left atrium:  The atrium was severely dilated.  ------------------------------------------------------------------- Right ventricle:  The  cavity size was normal. Systolic function was normal.  ------------------------------------------------------------------- Pulmonic valve:    Doppler:  Transvalvular velocity was within the normal range. There was no evidence for stenosis.  ------------------------------------------------------------------- Tricuspid valve:   Structurally normal valve.    Doppler: Transvalvular velocity was within the normal range. There was trivial regurgitation.  ------------------------------------------------------------------- Right atrium:  The atrium was normal in size.  ------------------------------------------------------------------- Pericardium:  There was no pericardial effusion.  ------------------------------------------------------------------- Measurements   Left ventricle                             Value        Reference  LV ID, ED, PLAX chordal                    51    mm     43 - 52  LV ID, ES, PLAX chordal          (H)       42    mm     23 - 38  LV fx shortening, PLAX chordal   (L)       18    %      >=29  LV PW thickness, ED                        14.95 mm     ----------  IVS/LV PW ratio, ED                        1.28         <=1.3  LV e&', medial                              8.89  cm/s   ----------  LV E/e&', medial                            11.92        ----------    Ventricular septum                         Value        Reference  IVS thickness, ED                          19.08 mm     ----------    LVOT                                       Value        Reference  LVOT ID, S                                 23    mm     ----------  LVOT area                                  4.15  cm^2   ----------     Aortic valve  Value        Reference  Aortic regurg peak velocity                275   cm/s   ----------  Aortic regurg pressure half-time           441   ms     ----------  Aortic regurg peak gradient                30    mm Hg  ----------    Aorta                                      Value        Reference  Aortic root ID, ED                         38    mm     ----------    Left atrium                                Value        Reference  LA ID, A-P, ES                             52    mm     ----------  LA ID/bsa, A-P                             2.06  cm/m^2 <=2.2  LA volume, S                               139   ml     ----------  LA volume/bsa, S                           55.2  ml/m^2 ----------  LA volume, ES, 1-p A4C                     145   ml     ----------  LA volume/bsa, ES, 1-p A4C                 57.6  ml/m^2 ----------  LA volume, ES, 1-p A2C                     117   ml     ----------  LA volume/bsa, ES, 1-p A2C                 46.5  ml/m^2 ----------    Mitral valve                               Value        Reference  Mitral E-wave peak velocity                106   cm/s   ----------  Mitral peak gradient, D                    4  mm Hg  ----------    Right atrium                               Value        Reference  RA ID, S-I, ES, A4C              (H)       60.8  mm     34 - 49  RA area, ES, A4C                           16.9  cm^2   8.3 - 19.5  RA volume, ES, A/L                         38    ml     ----------  RA volume/bsa, ES, A/L                     15.1  ml/m^2 ----------    Right ventricle                            Value        Reference  RV s&', lateral, S                          13.6  cm/s   ----------  Legend: (L)  and  (H)  mark values outside specified reference range.  ------------------------------------------------------------------- Prepared and Electronically Authenticated by  Kirk Ruths 2018-04-27T08:46:35   Impression:  Patient has a long history of atrial fibrillation dating back approximately 5 years that was initially paroxysmal but is now long-standing persistent.  He has failed Tikosyn therapy and is felt to be relatively poor candidate for an attempt at catheter-based ablation.  He describes stable symptoms of exertional shortness of breath and fatigue consistent with chronic diastolic congestive heart failure, New York Heart Association functional class II.  Since he has remained in persistent atrial fibrillation he has not been experiencing significant palpitations.  Previous transthoracic echocardiograms have documented the presence of normal left ventricular systolic dysfunction with moderate left ventricular hypertrophy, mild mitral regurgitation, and severe left atrial enlargement.  Previous nuclear stress test have felt to be low risk for myocardial ischemia.  Reasonable treatment alternatives for management of the patient's atrial fibrillation include continued medical therapy with long-term anticoagulation and rate control, another attempt at pharmacologic cardioversion with a different agent (amiodarone), catheter-based ablation, or surgical Maze procedure.   Plan:  I have discussed the nature of the patient's underlying symptoms related to chronic diastolic congestive heart failure with numerous contributing sources including persistent atrial fibrillation, medical therapy for atrial fibrillation, hypertensive heart disease, obesity, and obstructive sleep apnea.  We discussed the pathophysiology of atrial fibrillation and goals for long-term treatment including possible symptomatic improvement, restoration of AV synchrony, and decreased risk of thromboembolism and stroke.  The relative risks and benefits of performing a maze procedure was discussed at length, including the expected likelihood of long term freedom from recurrent symptomatic atrial fibrillation  and/or atrial flutter.  Alternative surgical approaches have been discussed including a comparison between conventional sternotomy and minimally-invasive techniques.  Expectations for the patient's postoperative convalescence have been discussed.  The patient is interested in proceeding with further diagnostic tests and  possible surgical Maze procedure in the near future.   As a next step he will undergo transesophageal echocardiogram and left and right heart catheterization.  He will undergo CT angiography to evaluate the feasibility of peripheral cannulation for surgery.  Formal pulmonary function testing will be performed.  For personal reasons he desires to hold off on elective surgery until this fall.  He will return to our office in approximately 2 months to review the results of these tests and make final plans for surgery.  All questions answered.  I spent in excess of 90 minutes during the conduct of this office consultation and >50% of this time involved direct face-to-face encounter with the patient for counseling and/or coordination of their care.    Valentina Gu. Roxy Manns, MD 02/02/2018 4:41 PM

## 2018-02-03 ENCOUNTER — Other Ambulatory Visit: Payer: Self-pay | Admitting: *Deleted

## 2018-02-03 DIAGNOSIS — I7101 Dissection of thoracic aorta: Secondary | ICD-10-CM

## 2018-02-03 DIAGNOSIS — I71019 Dissection of thoracic aorta, unspecified: Secondary | ICD-10-CM

## 2018-02-03 DIAGNOSIS — I4819 Other persistent atrial fibrillation: Secondary | ICD-10-CM

## 2018-02-15 DIAGNOSIS — K648 Other hemorrhoids: Secondary | ICD-10-CM | POA: Diagnosis not present

## 2018-02-15 DIAGNOSIS — Z86010 Personal history of colonic polyps: Secondary | ICD-10-CM | POA: Diagnosis not present

## 2018-02-15 DIAGNOSIS — D126 Benign neoplasm of colon, unspecified: Secondary | ICD-10-CM | POA: Diagnosis not present

## 2018-02-17 DIAGNOSIS — D126 Benign neoplasm of colon, unspecified: Secondary | ICD-10-CM | POA: Diagnosis not present

## 2018-02-19 ENCOUNTER — Ambulatory Visit (HOSPITAL_COMMUNITY)
Admission: RE | Admit: 2018-02-19 | Discharge: 2018-02-19 | Disposition: A | Discharge status: Home / Self Care | Payer: Medicare HMO | Source: Ambulatory Visit | Attending: Thoracic Surgery (Cardiothoracic Vascular Surgery) | Admitting: Thoracic Surgery (Cardiothoracic Vascular Surgery)

## 2018-02-19 ENCOUNTER — Ambulatory Visit
Admission: RE | Admit: 2018-02-19 | Discharge: 2018-02-19 | Disposition: A | Discharge status: Home / Self Care | Payer: Medicare HMO | Source: Ambulatory Visit | Attending: Thoracic Surgery (Cardiothoracic Vascular Surgery) | Admitting: Thoracic Surgery (Cardiothoracic Vascular Surgery)

## 2018-02-19 DIAGNOSIS — I499 Cardiac arrhythmia, unspecified: Secondary | ICD-10-CM | POA: Diagnosis not present

## 2018-02-19 DIAGNOSIS — I7101 Dissection of thoracic aorta: Secondary | ICD-10-CM | POA: Insufficient documentation

## 2018-02-19 DIAGNOSIS — I71019 Dissection of thoracic aorta, unspecified: Secondary | ICD-10-CM

## 2018-02-19 DIAGNOSIS — I4819 Other persistent atrial fibrillation: Secondary | ICD-10-CM

## 2018-02-19 DIAGNOSIS — I481 Persistent atrial fibrillation: Secondary | ICD-10-CM | POA: Diagnosis present

## 2018-02-19 DIAGNOSIS — K5730 Diverticulosis of large intestine without perforation or abscess without bleeding: Secondary | ICD-10-CM | POA: Diagnosis not present

## 2018-02-19 LAB — PULMONARY FUNCTION TEST
DL/VA % pred: 98 %
DL/VA: 4.52 ml/min/mmHg/L
DLCO unc % pred: 84 %
DLCO unc: 27.23 ml/min/mmHg
FEF 25-75 PRE: 3.13 L/s
FEF2575-%Pred-Pre: 115 %
FEV1-%Pred-Pre: 96 %
FEV1-PRE: 3.3 L
FEV1FVC-%Pred-Pre: 105 %
FEV6-%Pred-Pre: 95 %
FEV6-Pre: 4.16 L
FEV6FVC-%Pred-Pre: 104 %
FVC-%Pred-Pre: 90 %
FVC-Pre: 4.17 L
PRE FEV1/FVC RATIO: 79 %
Pre FEV6/FVC Ratio: 100 %
RV % pred: 110 %
RV: 2.61 L
TLC % pred: 96 %
TLC: 6.77 L

## 2018-02-19 MED ORDER — IOPAMIDOL (ISOVUE-370) INJECTION 76%
75.0000 mL | Freq: Once | INTRAVENOUS | Status: AC | PRN
  Administered 2018-02-19: 75 mL via INTRAVENOUS

## 2018-03-25 DIAGNOSIS — I4891 Unspecified atrial fibrillation: Secondary | ICD-10-CM | POA: Diagnosis not present

## 2018-03-25 DIAGNOSIS — Z7901 Long term (current) use of anticoagulants: Secondary | ICD-10-CM | POA: Diagnosis not present

## 2018-03-25 DIAGNOSIS — Z6839 Body mass index (BMI) 39.0-39.9, adult: Secondary | ICD-10-CM | POA: Diagnosis not present

## 2018-03-25 DIAGNOSIS — I10 Essential (primary) hypertension: Secondary | ICD-10-CM | POA: Diagnosis not present

## 2018-03-25 DIAGNOSIS — R609 Edema, unspecified: Secondary | ICD-10-CM | POA: Diagnosis not present

## 2018-03-25 DIAGNOSIS — G4733 Obstructive sleep apnea (adult) (pediatric): Secondary | ICD-10-CM | POA: Diagnosis not present

## 2018-03-25 DIAGNOSIS — M5126 Other intervertebral disc displacement, lumbar region: Secondary | ICD-10-CM | POA: Diagnosis not present

## 2018-03-25 DIAGNOSIS — Z96651 Presence of right artificial knee joint: Secondary | ICD-10-CM | POA: Diagnosis not present

## 2018-04-12 ENCOUNTER — Encounter: Payer: Self-pay | Admitting: Thoracic Surgery (Cardiothoracic Vascular Surgery)

## 2018-04-12 ENCOUNTER — Other Ambulatory Visit: Payer: Self-pay

## 2018-04-12 ENCOUNTER — Ambulatory Visit: Payer: Medicare HMO | Admitting: Thoracic Surgery (Cardiothoracic Vascular Surgery)

## 2018-04-12 VITALS — BP 144/80 | HR 99 | Resp 16 | Ht 70.5 in | Wt 276.0 lb

## 2018-04-12 DIAGNOSIS — I481 Persistent atrial fibrillation: Secondary | ICD-10-CM

## 2018-04-12 DIAGNOSIS — I4819 Other persistent atrial fibrillation: Secondary | ICD-10-CM

## 2018-04-12 NOTE — Patient Instructions (Signed)
Continue all previous medications without any changes at this time  

## 2018-04-12 NOTE — Progress Notes (Signed)
[address]       [place] [address]             [phone]     CARDIOTHORACIC SURGERY OFFICE NOTE  Referring Provider is Thompson Grayer, MD  Primary Cardiologist is Lorretta Harp, MD PCP is Seward Carol, MD   HPI:  Patient is a 67 year old obese white male with history of long-standing persistent atrial fibrillation, hypertension, chronic diastolic congestive heart failure, and obstructive sleep apnea who returns to the office today to discuss surgical options for treatment of atrial fibrillation.  He was originally seen in consultation on February 02, 2018.  Potential risks and benefits of a surgical maze procedure were discussed at length.  The patient was interested in proceeding with surgery but wanted to wait until the fall.  Plans were requested for him to undergo pulmonary function tests, diagnostic cardiac catheterization, transesophageal echocardiogram, and CT angiography.  The patient returns the office today for follow-up.  He underwent pulmonary function test and CT angiography but never received a phone call to schedule his TEE or heart cath.  He reports no new problems or complaints over the last few months.  The remainder of his review of systems and past medical history are unchanged.   Current Outpatient Medications  Medication Sig Dispense Refill  . furosemide (LASIX) 40 MG tablet Take 1 tablet (40 mg total) by mouth every other day. (Patient taking differently: Take 40 mg by mouth daily. ) 45 tablet 3  . KLOR-CON 10 10 MEQ tablet Take 1 tablet (10 mEq total) by mouth daily. Take 1 extra tablet Mon, Wed, Fri 48 tablet 6  . lisinopril (PRINIVIL,ZESTRIL) 40 MG tablet Take 40 mg by mouth every morning.    . metoprolol succinate (TOPROL-XL) 50 MG 24 hr tablet TAKE 1 TABLET (50 MG TOTAL) BY MOUTH DAILY. 30 tablet 1  . Probiotic Product (PROBIOTIC PO) Take 1 capsule by mouth daily.    [name] 20 MG TABS tablet TAKE 1 TABLET (20 MG TOTAL) BY MOUTH  DAILY. 30 tablet 6   No current facility-administered medications for this visit.       Physical Exam:   BP (!) 144/80 (BP Location: Right Arm, Patient Position: Sitting, Cuff Size: Large)   Pulse 99 Comment: IRREG  Resp 16   Ht 5' 10.5" (1.791 m)   Wt 276 lb (125.2 kg)   SpO2 96% Comment: ON RA  BMI 39.04 kg/m   General:  Obese but well-appearing  Chest:   Clear to auscultation  CV:   Regular rate and rhythm  Incisions:  n/a  Abdomen:  Soft nontender  Extremities:  Warm and well-perfused  Diagnostic Tests:  CT ANGIOGRAPHY CHEST, ABDOMEN AND PELVIS  TECHNIQUE: Multidetector CT imaging through the chest, abdomen and pelvis was performed using the standard protocol during bolus administration of intravenous contrast. Multiplanar reconstructed images and MIPs were obtained and reviewed to evaluate the vascular anatomy.  CONTRAST:  52mL ISOVUE-370 IOPAMIDOL (ISOVUE-370) INJECTION 76%  COMPARISON:  None.  FINDINGS: CTA CHEST FINDINGS  Cardiovascular:  Heart:  No cardiomegaly. No pericardial fluid/thickening. No significant coronary calcifications.  Aorta:  Greatest diameter of the ascending aorta measures 4.4 cm. No aneurysm or dissection flap. No periaortic fluid.  Pulmonary arteries:  No central, lobar, segmental, or proximal subsegmental filling defects.  Mediastinum/Nodes: Small lymph nodes of the mediastinum, none of which are enlarged. Index lymph node in the lowest paratracheal station measures 9 mm. Unremarkable appearance of  the thoracic esophagus.  Nodular changes of the left thyroid.  Lungs/Pleura: Central airways are clear. No pleural effusion. No confluent airspace disease.  No pneumothorax.  Musculoskeletal: No acute displaced fracture. Degenerative changes of the spine.  Review of the MIP images confirms the above findings.  CTA ABDOMEN AND PELVIS FINDINGS  VASCULAR  Aorta: Unremarkable course, caliber,  contour of the abdominal aorta. No dissection, aneurysm, or periaortic fluid. Mild atherosclerotic changes.  Celiac: No significant atherosclerotic changes at the origin of the celiac artery. Typical branch pattern of the celiac artery, with left gastric, common hepatic, and splenic artery identified.  SMA: No significant atherosclerotic changes of the superior mesenteric artery.  Renals: Renal arteries are patent. Single left and single right renal artery.  IMA: Inferior mesenteric artery is patent. Left colic artery patent. Superior rectal artery patent.  Right lower extremity:  Unremarkable caliber, and contour of the right iliac system, with tortuosity. No aneurysm, dissection, or occlusion. Hypogastric artery is patent. Anterior and posterior division patent. Common femoral artery patent. Proximal SFA and profunda femoris patent.  Left lower extremity:  Unremarkable caliber, and contour of the left iliac system, with tortuosity. No aneurysm, dissection, or occlusion. Hypogastric artery is patent. Anterior and posterior division patent. Common femoral artery patent, with a high bifurcation. Proximal SFA and profunda femoris patent.  Veins: Unremarkable appearance of the venous system.  Review of the MIP images confirms the above findings.  NON-VASCULAR  Hepatobiliary: Unremarkable appearance of the liver. Unremarkable gall bladder.  Pancreas: Unremarkable pancreas  Spleen: Unremarkable.  Adrenals/Urinary Tract: Unremarkable appearance of adrenal glands.  Right:  No hydronephrosis. Symmetric perfusion to the left. No nephrolithiasis. Unremarkable course of the right ureter. Low-density cystic structure of the lateral cortex, compatible with Bosniak 1 cyst.  Left:  No hydronephrosis. Symmetric perfusion to the right. No nephrolithiasis. Unremarkable course of the left ureter. Focal cortical defect of the lateral left kidney, likely from  prior infection/infarction.  Unremarkable appearance of the urinary bladder .  Stomach/Bowel: Unremarkable appearance of the stomach. Small hiatal hernia. Unremarkable appearance of small bowel. No evidence of obstruction. Colonic diverticula. No acute inflammatory changes normal appendix.  Lymphatic: No lymphadenopathy  Mesenteric: No free fluid or air. No adenopathy.  Reproductive: Transverse diameter of the prostate measures 5.5 cm  Other: Bilateral fat containing inguinal hernia. Fat containing umbilical hernia  Musculoskeletal: Multilevel degenerative changes of the thoracolumbar spine. Vacuum disc phenomenon at L4-L5 and L5-S1. Surgical changes of the left hip.  Marland Kitchen  IMPRESSION: No acute finding.  Nodular changes of the left thyroid incompletely characterized by CT. If warranted, dedicated thyroid ultrasound would be the recommended imaging test for characterisation.  Diverticular disease without evidence of acute diverticulitis.  Small hiatal hernia.  Signed,  Dulcy Fanny. Dellia Nims, RPVI  [facility]  [facility]   Electronically Signed   By: Corrie Mckusick D.O.   On: 02/19/2018 12:06     Pulmonary Function Tests  FVC  4.17 L  (90% predicted) FEV1  3.30 L  (96% predicted) FEF25-75 3.13 L  (115% predicted)   TLC  6.77 L  (96% predicted) RV  2.61 L  (110% predicted) DLCO  84% predicted   Impression:  Patient has a long history of atrial fibrillation dating back approximately 5 years that was initially paroxysmal but is now long-standing persistent.  He has failed Tikosyn therapy and is felt to be relatively poor candidate for an attempt at catheter-based ablation.  He describes stable symptoms of  exertional shortness of breath and fatigue consistent with chronic diastolic congestive heart failure, New York Heart Association functional class II.  Since he has remained in persistent atrial  fibrillation he has not been experiencing significant palpitations.  Previous transthoracic echocardiograms have documented the presence of normal left ventricular systolic dysfunction with moderate left ventricular hypertrophy, mild mitral regurgitation, and severe left atrial enlargement.  Previous nuclear stress test have felt to be low risk for myocardial ischemia.  Reasonable treatment alternatives for management of the patient's atrial fibrillation include continued medical therapy with long-term anticoagulation and rate control, another attempt at pharmacologic cardioversion with a different agent (amiodarone), catheter-based ablation, or surgical Maze procedure.    Plan:  I have again reviewed the various treatment options with the patient and his wife in the office today.  The patient remains interested in elective surgical Maze procedure in the near future.  I have reminded the patient regarding the need for further diagnostic testing before a final recommendation can be made as to surgical approach.  We will make arrangements for him to undergo TEE and left and right heart catheterization in the near future.  He will return to our office for follow-up after these have been done in approximately 2 to 3 weeks.  I spent in excess of 10 minutes during the conduct of this office consultation and >50% of this time involved direct face-to-face encounter with the patient for counseling and/or coordination of their care.    Valentina Gu. Roxy Manns, MD 04/12/2018 10:37 AM

## 2018-04-13 ENCOUNTER — Encounter: Payer: Self-pay | Admitting: Nurse Practitioner

## 2018-04-14 ENCOUNTER — Encounter: Payer: Self-pay | Admitting: Nurse Practitioner

## 2018-04-14 ENCOUNTER — Ambulatory Visit: Payer: Medicare HMO | Admitting: Nurse Practitioner

## 2018-04-14 VITALS — BP 140/70 | HR 72 | Ht 70.0 in | Wt 281.0 lb

## 2018-04-14 DIAGNOSIS — I4819 Other persistent atrial fibrillation: Secondary | ICD-10-CM | POA: Diagnosis not present

## 2018-04-14 LAB — COMPREHENSIVE METABOLIC PANEL
ALT: 43 IU/L (ref 0–44)
AST: 30 IU/L (ref 0–40)
Albumin/Globulin Ratio: 2.7 — ABNORMAL HIGH (ref 1.2–2.2)
Albumin: 4.6 g/dL (ref 3.6–4.8)
Alkaline Phosphatase: 45 IU/L (ref 39–117)
BUN/Creatinine Ratio: 22 (ref 10–24)
BUN: 21 mg/dL (ref 8–27)
Bilirubin Total: 0.6 mg/dL (ref 0.0–1.2)
CO2: 26 mmol/L (ref 20–29)
Calcium: 9.2 mg/dL (ref 8.6–10.2)
Chloride: 102 mmol/L (ref 96–106)
Creatinine, Ser: 0.97 mg/dL (ref 0.76–1.27)
GFR calc Af Amer: 94 mL/min/{1.73_m2} (ref >59)
GFR calc non Af Amer: 82 mL/min/{1.73_m2} (ref >59)
Globulin, Total: 1.7 g/dL (ref 1.5–4.5)
Glucose: 85 mg/dL (ref 65–99)
Potassium: 3.8 mmol/L (ref 3.5–5.2)
Sodium: 143 mmol/L (ref 134–144)
Total Protein: 6.3 g/dL (ref 6.0–8.5)

## 2018-04-14 LAB — CBC
Hematocrit: 44.4 % (ref 37.5–51.0)
Hemoglobin: 15.6 g/dL (ref 13.0–17.7)
MCH: 28.9 pg (ref 26.6–33.0)
MCHC: 35.1 g/dL (ref 31.5–35.7)
MCV: 82 fL (ref 79–97)
Platelets: 143 10*3/uL — ABNORMAL LOW (ref 150–450)
RBC: 5.4 x10E6/uL (ref 4.14–5.80)
RDW: 14.5 % (ref 12.3–15.4)
WBC: 6.6 10*3/uL (ref 3.4–10.8)

## 2018-04-14 NOTE — Patient Instructions (Addendum)
We will be checking the following labs today - CMET & CBC   Medication Instructions:    Continue with your current medicines.     Testing/Procedures To Be Arranged:  TEE  Cardiac catheterization  Follow-Up:   See Dr. Roxy Manns as planned later this month  Needs post cath/TEE visit with Dr. Gwenlyn Found and his team in 4 to 6 weeks.     Other Special Instructions:   Your provider has recommended a TEE - transesophageal echocardiogram.  You are scheduled for your TEE on Wednesday, October 9th at 12noon with Dr. Marlou Porch or associates. Please go to [facility] [place] Stay at Wednesday, October 9 at 10 AM.  Enter through the [place] not have any food or drink after midnight on Tuesday.  You may take your medicines with a sip of water on the day of your procedure.  You will need someone to drive you home following your procedure.    Call the [place] office at [phone] if you have any questions, problems or concerns.   Transesophageal Echocardiogram Transesophageal echocardiography (TEE) is a special type of test that produces images of the heart by using sound waves (echocardiogram). This type of echocardiography can obtain better images of the heart than standard echocardiography. TEE is done by passing a flexible tube down the esophagus. The heart is located in front of the esophagus. Because the heart and esophagus are close to one another, your health care provider can take very clear, detailed pictures of the heart via ultrasound waves. TEE may be done: If your health care provider needs more information based on standard echocardiography findings. If you had a stroke. This might have happened because a clot formed in your heart. TEE can visualize different areas of the heart and check for clots. To check valve anatomy and function. To check for infection on the inside of your heart (endocarditis). To evaluate the dividing  wall (septum) of the heart and presence of a hole that did not close after birth (patent foramen ovale or atrial septal defect). To help diagnose a tear in the wall of the aorta (aortic dissection). During cardiac valve surgery. This allows the surgeon to assess the valve repair before closing the chest. During a variety of other cardiac procedures to guide positioning of catheters. Sometimes before a cardioversion, which is a shock to convert heart rhythm back to normal.  LET [facility] CARE PROVIDER KNOW ABOUT:  Any allergies you have. All medicines you are taking, including vitamins, herbs, eye drops, creams, and over-the-counter medicines. Previous problems you or members of your family have had with the use of anesthetics. Any blood disorders you have. Previous surgeries you have had. Medical conditions you have. Swallowing difficulties. An esophageal obstruction.  RISKS AND COMPLICATIONS  Generally, TEE is a safe procedure. However, as with any procedure, complications can occur. Possible complications include an esophageal tear (rupture), perforation, aspiration and/or oversedation. You may have a sore throat following this procedure.  BEFORE THE PROCEDURE  Do not eat or drink for 6 hours before the procedure or as directed by your health care provider. Arrange for someone to drive you home after the procedure. Do not drive yourself home. During the procedure, you will be given medicines that can continue to make you feel drowsy and can impair your reflexes. An IV access tube will be started in the arm.  PROCEDURE  A medicine to help you relax (sedative) will be  given through the IV access tube. A medicine may be sprayed or gargled to numb the back of the throat. Your blood pressure, heart rate, and breathing (vital signs) will be monitored during the procedure. The TEE probe is a long, flexible tube. The tip of the probe is placed into the back of the mouth, and you will be asked  to swallow. This helps to pass the tip of the probe into the esophagus. Once the tip of the probe is in the correct area, your health care provider can take pictures of the heart. TEE is usually not a painful procedure. You may feel the probe press against the back of the throat. The probe does not enter the trachea and does not affect your breathing.  AFTER THE PROCEDURE  You will be in bed, resting, until you have fully returned to consciousness. When you first awaken, your throat may feel slightly sore and will probably still feel numb. This will improve slowly over time. You will not be allowed to eat or drink until it is clear that the numbness has improved. Once you have been able to drink, urinate, and sit on the edge of the bed without feeling sick to your stomach (nausea) or dizzy, you may be cleared to go home. You should have a friend or family member with you for the next 24 hours after your procedure.     Your provider has recommended a cardiac catherization  You are scheduled for a cardiac catheterization on Wednesday, October 9th at 1:30PM with Dr. Saunders Revel or associate.  Please arrive at the [facility] (Main Entrance) at [facility] at [address] [place] Stay on Wednesday, October 9th at 10 AM.    Special note: Every effort is made to have your procedure done on time.   Please understand that emergencies sometimes delay a scheduled   procedure.  No food or drink after midnight on Tuesday  You may take your morning medications with a sip of water on the day of your procedure.  Please take a baby aspirin (81 mg) on the morning of your procedure.   Medications to [place] for a one night stay -- bring personal belongings.  Bring a current list of your medications and current insurance cards.  You MUST have a responsible person to drive you home. Someone MUST be with you the first 24 hours  after you arrive home or your discharge will be delayed. Wear clothes that are easy to get on and off and wear slip on shoes.    Coronary Angiogram A coronary angiogram, also called coronary angiography, is an X-ray procedure used to look at the arteries in the heart. In this procedure, a dye (contrast dye) is injected through a long, hollow tube (catheter). The catheter is about the size of a piece of cooked spaghetti and is inserted through your groin, wrist, or arm. The dye is injected into each artery, and X-rays are then taken to show if there is a blockage in the arteries of your heart.  LET [facility] CARE PROVIDER KNOW ABOUT:  Any allergies you have, including allergies to shellfish or contrast dye.    All medicines you are taking, including vitamins, herbs, eye drops, creams, and over-the-counter medicines.    Previous problems you or members of your family have had with the use of anesthetics.    Any blood disorders you have.  Previous surgeries you have had.  History of kidney problems or failure.    Other medical conditions you have.  RISKS AND COMPLICATIONS  Generally, a coronary angiogram is a safe procedure. However, about 1 person out of 1000 can have problems that may include:  Allergic reaction to the dye.  Bleeding/bruising from the access site or other locations.  Kidney injury, especially in people with impaired kidney function.   Stroke (rare).  Heart attack (rare).  Irregular rhythms (rare)  Death (rare)  BEFORE THE PROCEDURE   Do not eat or drink anything after midnight the night before the procedure or as directed by your health care provider.    Ask your health care provider about changing or stopping your regular medicines. This is especially important if you are taking diabetes medicines or blood thinners.  PROCEDURE  You may be given a medicine to help you relax (sedative) before the procedure. This medicine is given through an  intravenous (IV) access tube that is inserted into one of your veins.    The area where the catheter will be inserted will be washed and shaved. This is usually done in the groin but may be done in the fold of your arm (near your elbow) or in the wrist.     A medicine will be given to numb the area where the catheter will be inserted (local anesthetic).    The health care provider will insert the catheter into an artery. The catheter will be guided by using a special type of X-ray (fluoroscopy) of the blood vessel being examined.    A special dye will then be injected into the catheter, and X-rays will be taken. The dye will help to show where any narrowing or blockages are located in the heart arteries.     AFTER THE PROCEDURE   If the procedure is done through the leg, you will be kept in bed lying flat for several hours. You will be instructed to not bend or cross your legs.  The insertion site will be checked frequently.    The pulse in your feet or wrist will be checked frequently.    Additional blood tests, X-rays, and an electrocardiogram may be done.         If you need a refill on your cardiac medications before your next appointment, please call your pharmacy.   Call the [place] office at [phone] if you have any questions, problems or concerns.

## 2018-04-14 NOTE — H&P (View-Only) (Signed)
CARDIOLOGY OFFICE NOTE  Date:  04/14/2018    [name] Date of Birth: 1950/11/25 Medical Record #[id number]  PCP:  Seward Carol, MD  Cardiologist:  [place]   Chief Complaint  Andrews presents with  . Atrial Fibrillation    Work in to arrange cath/TEE - see Dr. Rayann Heman and Gwenlyn Found    History of Present Illness: [name] is a 67 y.o. male who presents today for a work in visit due to upcoming [place].   He has a history of AF, chronic diastolic HF, obesity, OSA on CPAP and HTN. He was previously on Tikosyn.   Seen here back in June with Dr. Rayann Heman - continued to have symptomatic AF - ablation was declined and [name] wanted to proceed with MAZE. He has been referred to Dr. Roxy Manns for surgical intervention.   Comes in today. Here alone. He is anxious to proceed with possible surgical MAZE. Has days where he feels well and days where he does not. Feels like all related to AF. Remains on Xarelto - no problems noted. Notes heavy gag reflex. No chest pain. Breathing is ok. Using his CPAP. No syncope. Otherwise, has no concerns.   Past Medical History:  Diagnosis Date  . Arthritis    "joints" (09/26/2013)  . Atrial enlargement, left   . Chronic diastolic congestive heart failure ([place])   . Hearing aid worn    both ears  . Hypertension   . Obesity   . OSA on CPAP    "mask adjusts to what I need" (09/26/2013)  . Persistent atrial fibrillation    cardioversion 06/06/13  . Rotator cuff tear, right dec 2012   physical therapy done, decreased strength  . Wears glasses     Past Surgical History:  Procedure Laterality Date  . CARDIOVERSION N/A 06/06/2013   Procedure: CARDIOVERSION;  Surgeon: Sanda Klein, MD;  Location: [facility];  Service: Cardiovascular;  Laterality: N/A;  . CARDIOVERSION N/A 09/28/2013   Procedure: CARDIOVERSION BEDSIDE;  Surgeon: Peter M Martinique, MD;  Location: [facility];  Service: Cardiovascular;  Laterality: N/A;  . CARPAL TUNNEL  RELEASE Right 08/08/2013   Procedure: RIGHT CARPAL TUNNEL RELEASE;  Surgeon: Wynonia Sours, MD;  Location: [facility];  Service: Orthopedics;  Laterality: Right;  . CARPAL TUNNEL RELEASE Left 2008  . KNEE ARTHROSCOPY Right 1990's  . SHOULDER OPEN ROTATOR CUFF REPAIR Left 1990's  . SHOULDER OPEN ROTATOR CUFF REPAIR Right 11/2007  . TONSILLECTOMY  1950's  . TOTAL HIP ARTHROPLASTY Left 2010  . TOTAL HIP REVISION Left 10/08/2011  . TOTAL HIP REVISION  04/09/2012   Procedure: TOTAL HIP REVISION;  Surgeon: Gearlean Alf, MD;  Location: [facility];  Service: Orthopedics;  Laterality: Left;  Left Hip Acetabular Revision vs Constrained Liner  . TOTAL KNEE ARTHROPLASTY Right 2006   . TRIGGER FINGER RELEASE Right 08/08/2013   Procedure: RELEASE STENOSING TENOSYNOVITIS RIGHT THUMB;  Surgeon: Wynonia Sours, MD;  Location: [facility];  Service: Orthopedics;  Laterality: Right;     Medications: Current Meds  Medication Sig  . furosemide (LASIX) 40 MG tablet Take 1 tablet (40 mg total) by mouth every other day. (Andrews taking differently: Take 40 mg by mouth daily. )  . KLOR-CON 10 10 MEQ tablet Take 1 tablet (10 mEq total) by mouth daily. Take 1 extra tablet Mon, Wed, Fri  . lisinopril (PRINIVIL,ZESTRIL) 40 MG tablet Take 40 mg by mouth every morning.  Marland Kitchen  metoprolol succinate (TOPROL-XL) 50 MG 24 hr tablet TAKE 1 TABLET (50 MG TOTAL) BY MOUTH DAILY.  . Probiotic Product (PROBIOTIC PO) Take 1 capsule by mouth daily.  [name] 20 MG TABS tablet TAKE 1 TABLET (20 MG TOTAL) BY MOUTH DAILY.     Allergies: Allergies  Allergen Reactions  . Oxycodone Rash    Can tolerate Hydrocodone    Social History: [name]  reports that he has never smoked. He has never used smokeless tobacco. He reports that he does not drink alcohol or use drugs.   Family History: [name]'s family history includes Other in his unknown relative.   Review of Systems: Please see Lance history of  present illness.   Otherwise, Lance review of systems is positive for none.   All other systems are reviewed and negative.   Physical Exam: VS:  BP 140/70 (BP Location: Left Arm, Andrews Position: Sitting, Cuff Size: Large)   Pulse 72   Ht 5\' 10"  (1.778 m)   Wt 281 lb (127.5 kg)   BMI 40.32 kg/m  .  BMI Body mass index is 40.32 kg/m.  Wt Readings from Last 3 Encounters:  04/14/18 281 lb (127.5 kg)  04/12/18 276 lb (125.2 kg)  02/02/18 276 lb (125.2 kg)    General: Pleasant. He is obese. Alert and in no acute distress.   HEENT: Normal.  Neck: Supple, no JVD, carotid bruits, or masses noted.  Cardiac: Regular rate and rhythm. No murmurs, rubs, or gallops. No edema.  Respiratory:  Lungs are clear to auscultation bilaterally with normal work of breathing.  GI: Soft and nontender.  MS: No deformity or atrophy. Gait and ROM intact.  Skin: Warm and dry. Color is normal.  Neuro:  Strength and sensation are intact and no gross focal deficits noted.  Psych: Alert, appropriate and with normal affect.   LABORATORY DATA:  EKG:  EKG is ordered today. This demonstrates atrial fib with a controlled VR.  Lab Results  Component Value Date   WBC 9.3 10/27/2016   HGB 14.8 10/27/2016   HCT 43.3 10/27/2016   PLT 180 10/27/2016   GLUCOSE 96 03/24/2017   ALT 21 04/05/2012   AST 18 04/05/2012   NA 144 03/24/2017   K 3.8 03/24/2017   CL 100 03/24/2017   CREATININE 1.20 03/24/2017   BUN 30 (H) 03/24/2017   CO2 25 03/24/2017   TSH 2.097 06/03/2013   INR 1.46 06/03/2013       BNP (last 3 results) No results for input(s): BNP in Lance last 8760 hours.  ProBNP (last 3 results) No results for input(s): PROBNP in Lance last 8760 hours.   Other Studies Reviewed Today:  Study Highlights  Myoview 10/2016   There is no evidence of ischemia  Lance LV is moderately dilated  Lance study is not gated so Lance LV function was not assessed.  Suggest echo for assessment of LV function  .  Intermittant risk myoview based on LV dilitation and likely reduced EF     Echo Study Conclusions 10/2016  - Left ventricle: Lance cavity size was normal. Wall thickness was   increased in a pattern of moderate LVH. There was mild focal   basal hypertrophy of Lance septum. Systolic function was normal.   Lance estimated ejection fraction was in Lance range of 55% to 60%.   Wall motion was normal; there were no regional wall motion   abnormalities. - Aortic valve: There was mild regurgitation. - Aortic root: Lance  aortic root was mildly dilated. - Mitral valve: There was mild regurgitation. - Left atrium: Lance atrium was severely dilated.  Impressions:  - Definity used; normal LV systolic function; moderated LVH; mild   AI; mild MR; severe LAE.  Assessment/Plan:  1. Persistent AF - symptomatic - wanting to proceed with MAZE - seeing Dr. Roxy Manns for surgical intervention. Needs TEE and cath - these studies are arranged for next week. [name] understands that risks include but are not limited to stroke (1 in 1000), death (1 in 110), kidney failure [usually temporary] (1 in 500), bleeding (1 in 200), allergic reaction [possibly serious] (1 in 200), and agrees to proceed. [name] understands that Lance risks associated with a TEE include sore throat, aspiration, perforation/tear of Lance esophagus and oversedation. Instructions given. Discussed with pharmacy here who has recommended to hold Xarelto 24 hours prior.   2. Chronic anticoagulation - no problems noted.   3. HTN - BP borderline. Would follow for now.   4. Obesity  5. OSA on CPAP - compliant.   Current medicines are reviewed with [name] today.  [name] does not have concerns regarding medicines other than what has been noted above.  Lance following changes have been made:  See above.  Labs/ tests ordered today include:    Orders Placed This Encounter  Procedures  . CBC  . Comprehensive metabolic panel  . EKG  12-Lead     Disposition:   FU with Dr. Gwenlyn Found and his team in 4 to 6 weeks.   Andrews is agreeable to this plan and will call if any problems develop in Lance interim.   SignedTruitt Merle, NP  04/14/2018 10:55 AM  [facility] [address] [place], [place]  [address] Phone: [phone] Fax: [phone]

## 2018-04-14 NOTE — Progress Notes (Signed)
CARDIOLOGY OFFICE NOTE  Date:  04/14/2018    [name] Date of Birth: 03-07-1951 Medical Record #[id number]  PCP:  Seward Carol, MD  Cardiologist:  [place]   Chief Complaint  Patient presents with  . Atrial Fibrillation    Work in to arrange cath/TEE - see Dr. Rayann Heman and Gwenlyn Found    History of Present Illness: [name] is a 67 y.o. male who presents today for a work in visit due to upcoming [place].   He has a history of AF, chronic diastolic HF, obesity, OSA on CPAP and HTN. He was previously on Tikosyn.   Seen here back in June with Dr. Rayann Heman - continued to have symptomatic AF - ablation was declined and the patient wanted to proceed with MAZE. He has been referred to Dr. Roxy Manns for surgical intervention.   Comes in today. Here alone. He is anxious to proceed with possible surgical MAZE. Has days where he feels well and days where he does not. Feels like all related to AF. Remains on Xarelto - no problems noted. Notes heavy gag reflex. No chest pain. Breathing is ok. Using his CPAP. No syncope. Otherwise, has no concerns.   Past Medical History:  Diagnosis Date  . Arthritis    "joints" (09/26/2013)  . Atrial enlargement, left   . Chronic diastolic congestive heart failure ([place])   . Hearing aid worn    both ears  . Hypertension   . Obesity   . OSA on CPAP    "mask adjusts to what I need" (09/26/2013)  . Persistent atrial fibrillation    cardioversion 06/06/13  . Rotator cuff tear, right dec 2012   physical therapy done, decreased strength  . Wears glasses     Past Surgical History:  Procedure Laterality Date  . CARDIOVERSION N/A 06/06/2013   Procedure: CARDIOVERSION;  Surgeon: Sanda Klein, MD;  Location: [facility];  Service: Cardiovascular;  Laterality: N/A;  . CARDIOVERSION N/A 09/28/2013   Procedure: CARDIOVERSION BEDSIDE;  Surgeon: Peter M Martinique, MD;  Location: [facility];  Service: Cardiovascular;  Laterality: N/A;  . CARPAL TUNNEL  RELEASE Right 08/08/2013   Procedure: RIGHT CARPAL TUNNEL RELEASE;  Surgeon: Wynonia Sours, MD;  Location: [facility];  Service: Orthopedics;  Laterality: Right;  . CARPAL TUNNEL RELEASE Left 2008  . KNEE ARTHROSCOPY Right 1990's  . SHOULDER OPEN ROTATOR CUFF REPAIR Left 1990's  . SHOULDER OPEN ROTATOR CUFF REPAIR Right 11/2007  . TONSILLECTOMY  1950's  . TOTAL HIP ARTHROPLASTY Left 2010  . TOTAL HIP REVISION Left 10/08/2011  . TOTAL HIP REVISION  04/09/2012   Procedure: TOTAL HIP REVISION;  Surgeon: Gearlean Alf, MD;  [facility];  Service: Orthopedics;  Laterality: Left;  Left Hip Acetabular Revision vs Constrained Liner  . TOTAL KNEE ARTHROPLASTY Right 2006   . TRIGGER FINGER RELEASE Right 08/08/2013   Procedure: RELEASE STENOSING TENOSYNOVITIS RIGHT THUMB;  Surgeon: Wynonia Sours, MD;  Location: [facility];  Service: Orthopedics;  Laterality: Right;     Medications: Current Meds  Medication Sig  . furosemide (LASIX) 40 MG tablet Take 1 tablet (40 mg total) by mouth every other day. (Patient taking differently: Take 40 mg by mouth daily. )  . KLOR-CON 10 10 MEQ tablet Take 1 tablet (10 mEq total) by mouth daily. Take 1 extra tablet Mon, Wed, Fri  . lisinopril (PRINIVIL,ZESTRIL) 40 MG tablet Take 40 mg by mouth every morning.  Marland Kitchen  metoprolol succinate (TOPROL-XL) 50 MG 24 hr tablet TAKE 1 TABLET (50 MG TOTAL) BY MOUTH DAILY.  . Probiotic Product (PROBIOTIC PO) Take 1 capsule by mouth daily.  [name] 20 MG TABS tablet TAKE 1 TABLET (20 MG TOTAL) BY MOUTH DAILY.     Allergies: Allergies  Allergen Reactions  . Oxycodone Rash    Can tolerate Hydrocodone    Social History: The patient  reports that he has never smoked. He has never used smokeless tobacco. He reports that he does not drink alcohol or use drugs.   Family History: The patient's family history includes Other in his unknown relative.   Review of Systems: Please see the history of  present illness.   Otherwise, the review of systems is positive for none.   All other systems are reviewed and negative.   Physical Exam: VS:  BP 140/70 (BP Location: Left Arm, Patient Position: Sitting, Cuff Size: Large)   Pulse 72   Ht 5\' 10"  (1.778 m)   Wt 281 lb (127.5 kg)   BMI 40.32 kg/m  .  BMI Body mass index is 40.32 kg/m.  Wt Readings from Last 3 Encounters:  04/14/18 281 lb (127.5 kg)  04/12/18 276 lb (125.2 kg)  02/02/18 276 lb (125.2 kg)    General: Pleasant. He is obese. Alert and in no acute distress.   HEENT: Normal.  Neck: Supple, no JVD, carotid bruits, or masses noted.  Cardiac: Regular rate and rhythm. No murmurs, rubs, or gallops. No edema.  Respiratory:  Lungs are clear to auscultation bilaterally with normal work of breathing.  GI: Soft and nontender.  MS: No deformity or atrophy. Gait and ROM intact.  Skin: Warm and dry. Color is normal.  Neuro:  Strength and sensation are intact and no gross focal deficits noted.  Psych: Alert, appropriate and with normal affect.   LABORATORY DATA:  EKG:  EKG is ordered today. This demonstrates atrial fib with a controlled VR.  Lab Results  Component Value Date   WBC 9.3 10/27/2016   HGB 14.8 10/27/2016   HCT 43.3 10/27/2016   PLT 180 10/27/2016   GLUCOSE 96 03/24/2017   ALT 21 04/05/2012   AST 18 04/05/2012   NA 144 03/24/2017   K 3.8 03/24/2017   CL 100 03/24/2017   CREATININE 1.20 03/24/2017   BUN 30 (H) 03/24/2017   CO2 25 03/24/2017   TSH 2.097 06/03/2013   INR 1.46 06/03/2013       BNP (last 3 results) No results for input(s): BNP in the last 8760 hours.  ProBNP (last 3 results) No results for input(s): PROBNP in the last 8760 hours.   Other Studies Reviewed Today:  Study Highlights  Myoview 10/2016   There is no evidence of ischemia  The LV is moderately dilated  The study is not gated so the LV function was not assessed.  Suggest echo for assessment of LV function  .  Intermittant risk myoview based on LV dilitation and likely reduced EF     Echo Study Conclusions 10/2016  - Left ventricle: The cavity size was normal. Wall thickness was   increased in a pattern of moderate LVH. There was mild focal   basal hypertrophy of the septum. Systolic function was normal.   The estimated ejection fraction was in the range of 55% to 60%.   Wall motion was normal; there were no regional wall motion   abnormalities. - Aortic valve: There was mild regurgitation. - Aortic root: The  aortic root was mildly dilated. - Mitral valve: There was mild regurgitation. - Left atrium: The atrium was severely dilated.  Impressions:  - Definity used; normal LV systolic function; moderated LVH; mild   AI; mild MR; severe LAE.  Assessment/Plan:  1. Persistent AF - symptomatic - wanting to proceed with MAZE - seeing Dr. Roxy Manns for surgical intervention. Needs TEE and cath - these studies are arranged for next week. The patient understands that risks include but are not limited to stroke (1 in 1000), death (1 in 64), kidney failure [usually temporary] (1 in 500), bleeding (1 in 200), allergic reaction [possibly serious] (1 in 200), and agrees to proceed. The patient understands that the risks associated with a TEE include sore throat, aspiration, perforation/tear of the esophagus and oversedation. Instructions given. Discussed with pharmacy here who has recommended to hold Xarelto 24 hours prior.   2. Chronic anticoagulation - no problems noted.   3. HTN - BP borderline. Would follow for now.   4. Obesity  5. OSA on CPAP - compliant.   Current medicines are reviewed with the patient today.  The patient does not have concerns regarding medicines other than what has been noted above.  The following changes have been made:  See above.  Labs/ tests ordered today include:    Orders Placed This Encounter  Procedures  . CBC  . Comprehensive metabolic panel  . EKG  12-Lead     Disposition:   FU with Dr. Gwenlyn Found and his team in 4 to 6 weeks.   Patient is agreeable to this plan and will call if any problems develop in the interim.   SignedTruitt Merle, NP  04/14/2018 10:55 AM  [facility] [address] [place],   [address] Phone: [phone] Fax: [phone]

## 2018-04-17 ENCOUNTER — Other Ambulatory Visit: Payer: Self-pay | Admitting: Physician Assistant

## 2018-04-20 ENCOUNTER — Telehealth: Payer: Self-pay | Admitting: Nurse Practitioner

## 2018-04-20 ENCOUNTER — Telehealth: Payer: Self-pay | Admitting: *Deleted

## 2018-04-20 NOTE — Telephone Encounter (Signed)
New Message   Patient is calling in reference to his upcoming procedures. He has some additional questions that he would like to ask. Please call to discuss.

## 2018-04-20 NOTE — Telephone Encounter (Signed)
Last filled by Rosaria Ferries

## 2018-04-20 NOTE — Telephone Encounter (Signed)
Pt contacted pre-catheterization scheduled at [facility] for: Wednesday April 21, 2018 1:30 PM/TEE 15 N Verified arrival time and place: [facility] at: 10:30 AM  Nothing to eat or drink after midnight prior to procedures.  Hold: Furosemide -AM of procedure KCl -AM of procedure Xarelto-none 04/20/18 until post procedures.  Except hold medications AM meds can be  taken pre-cath with sip of water including: ASA 81 mg  Confirmed patient has responsible person to drive home post procedure and for 24 hours after you arrive home: Yes

## 2018-04-20 NOTE — Telephone Encounter (Signed)
I called patient to discuss instructions for procedures tomorrow.   Patient states he has a significant gag reflex and feels he is going to require significant sedation to have this done. Pt states he was told provider performing TEE had been made aware of this.  Patient states he developed a cold-denies productive cough, fever, has some nasal congestion but can still breathe through his nose.  Pt states he does feel a "little slow",  generally feels okay, did not want to reschedule procedures.  Patient is requesting a call from Cecille Rubin, NP to discuss both of these issues. Patient advised I will forward to Cecille Rubin, NP.

## 2018-04-20 NOTE — Telephone Encounter (Signed)
Pt calling today because he has a cold. He wanted to be sure he could still have his procedures performed. Pt states he can still breath through his nose and is not running a fever. I advised pt he would have to reschedule if he was running a fever. The way to determine this is to hold any tylenol for at least 12hrs prior to his procedure. Pt states he will be compliant with this and had no additional questions.

## 2018-04-20 NOTE — Telephone Encounter (Signed)
Will route to Dr. Marlou Porch as Juluis Rainier.  I have spoken to the patient - he is reminded to alert the staff upon his arrival.  There will be sedation in place.

## 2018-04-21 ENCOUNTER — Other Ambulatory Visit: Payer: Self-pay

## 2018-04-21 ENCOUNTER — Encounter (HOSPITAL_COMMUNITY)
Admission: RE | Disposition: A | Discharge status: Home / Self Care | Payer: Self-pay | Source: Ambulatory Visit | Attending: Internal Medicine

## 2018-04-21 ENCOUNTER — Encounter (HOSPITAL_COMMUNITY): Payer: Self-pay | Admitting: *Deleted

## 2018-04-21 ENCOUNTER — Observation Stay (HOSPITAL_COMMUNITY)
Admission: RE | Admit: 2018-04-21 | Discharge: 2018-04-22 | Disposition: A | Discharge status: Home / Self Care | Payer: Medicare HMO | Source: Ambulatory Visit | Attending: Internal Medicine | Admitting: Internal Medicine

## 2018-04-21 ENCOUNTER — Ambulatory Visit (HOSPITAL_BASED_OUTPATIENT_CLINIC_OR_DEPARTMENT_OTHER): Payer: Medicare HMO

## 2018-04-21 ENCOUNTER — Other Ambulatory Visit (HOSPITAL_COMMUNITY): Payer: Self-pay | Admitting: Internal Medicine

## 2018-04-21 DIAGNOSIS — I5023 Acute on chronic systolic (congestive) heart failure: Secondary | ICD-10-CM | POA: Diagnosis not present

## 2018-04-21 DIAGNOSIS — Z7901 Long term (current) use of anticoagulants: Secondary | ICD-10-CM | POA: Diagnosis not present

## 2018-04-21 DIAGNOSIS — E669 Obesity, unspecified: Secondary | ICD-10-CM | POA: Insufficient documentation

## 2018-04-21 DIAGNOSIS — I110 Hypertensive heart disease with heart failure: Secondary | ICD-10-CM | POA: Insufficient documentation

## 2018-04-21 DIAGNOSIS — I340 Nonrheumatic mitral (valve) insufficiency: Secondary | ICD-10-CM | POA: Diagnosis not present

## 2018-04-21 DIAGNOSIS — I351 Nonrheumatic aortic (valve) insufficiency: Secondary | ICD-10-CM

## 2018-04-21 DIAGNOSIS — Z79899 Other long term (current) drug therapy: Secondary | ICD-10-CM | POA: Diagnosis not present

## 2018-04-21 DIAGNOSIS — I4819 Other persistent atrial fibrillation: Principal | ICD-10-CM | POA: Diagnosis present

## 2018-04-21 DIAGNOSIS — Z6841 Body Mass Index (BMI) 40.0 and over, adult: Secondary | ICD-10-CM | POA: Diagnosis not present

## 2018-04-21 DIAGNOSIS — I4891 Unspecified atrial fibrillation: Secondary | ICD-10-CM | POA: Diagnosis present

## 2018-04-21 DIAGNOSIS — Z96651 Presence of right artificial knee joint: Secondary | ICD-10-CM | POA: Insufficient documentation

## 2018-04-21 DIAGNOSIS — E876 Hypokalemia: Secondary | ICD-10-CM | POA: Diagnosis not present

## 2018-04-21 DIAGNOSIS — G4733 Obstructive sleep apnea (adult) (pediatric): Secondary | ICD-10-CM | POA: Insufficient documentation

## 2018-04-21 DIAGNOSIS — I080 Rheumatic disorders of both mitral and aortic valves: Secondary | ICD-10-CM | POA: Insufficient documentation

## 2018-04-21 DIAGNOSIS — I5043 Acute on chronic combined systolic (congestive) and diastolic (congestive) heart failure: Secondary | ICD-10-CM | POA: Diagnosis present

## 2018-04-21 DIAGNOSIS — Z885 Allergy status to narcotic agent status: Secondary | ICD-10-CM | POA: Diagnosis not present

## 2018-04-21 DIAGNOSIS — Z96642 Presence of left artificial hip joint: Secondary | ICD-10-CM | POA: Insufficient documentation

## 2018-04-21 HISTORY — PX: TEE WITHOUT CARDIOVERSION: SHX5443

## 2018-04-21 HISTORY — PX: RIGHT/LEFT HEART CATH AND CORONARY ANGIOGRAPHY: CATH118266

## 2018-04-21 HISTORY — DX: Nonrheumatic aortic (valve) insufficiency: I35.1

## 2018-04-21 HISTORY — DX: Nonrheumatic mitral (valve) insufficiency: I34.0

## 2018-04-21 LAB — POCT I-STAT 3, VENOUS BLOOD GAS (G3P V)
Acid-Base Excess: 2 mmol/L (ref 0.0–2.0)
Bicarbonate: 27.5 mmol/L (ref 20.0–28.0)
O2 SAT: 56 %
PCO2 VEN: 45.5 mmHg (ref 44.0–60.0)
PO2 VEN: 30 mmHg — AB (ref 32.0–45.0)
TCO2: 29 mmol/L (ref 22–32)
pH, Ven: 7.389 (ref 7.250–7.430)

## 2018-04-21 LAB — POCT I-STAT 3, ART BLOOD GAS (G3+)
Acid-Base Excess: 3 mmol/L — ABNORMAL HIGH (ref 0.0–2.0)
Bicarbonate: 26.7 mmol/L (ref 20.0–28.0)
O2 Saturation: 91 %
PCO2 ART: 38.6 mmHg (ref 32.0–48.0)
PH ART: 7.447 (ref 7.350–7.450)
PO2 ART: 58 mmHg — AB (ref 83.0–108.0)
TCO2: 28 mmol/L (ref 22–32)

## 2018-04-21 SURGERY — RIGHT/LEFT HEART CATH AND CORONARY ANGIOGRAPHY
Anesthesia: LOCAL

## 2018-04-21 SURGERY — ECHOCARDIOGRAM, TRANSESOPHAGEAL
Anesthesia: Moderate Sedation

## 2018-04-21 MED ORDER — DIPHENHYDRAMINE HCL 50 MG/ML IJ SOLN
INTRAMUSCULAR | Status: AC
  Filled 2018-04-21: qty 1

## 2018-04-21 MED ORDER — FUROSEMIDE 10 MG/ML IJ SOLN
40.0000 mg | Freq: Two times a day (BID) | INTRAMUSCULAR | Status: DC
  Administered 2018-04-21 – 2018-04-22 (×2): 40 mg via INTRAVENOUS
  Filled 2018-04-21: qty 4

## 2018-04-21 MED ORDER — BUTAMBEN-TETRACAINE-BENZOCAINE 2-2-14 % EX AERO
INHALATION_SPRAY | CUTANEOUS | Status: DC | PRN
  Administered 2018-04-21: 2 via TOPICAL
  Administered 2018-04-21: 1 via TOPICAL

## 2018-04-21 MED ORDER — ONDANSETRON HCL 4 MG/2ML IJ SOLN: 4.0000 mg | Freq: Four times a day (QID) | INTRAMUSCULAR | Status: DC | PRN

## 2018-04-21 MED ORDER — SODIUM CHLORIDE 0.9 % IV SOLN
INTRAVENOUS | Status: AC | PRN
  Administered 2018-04-21: 10 mL/h via INTRAVENOUS

## 2018-04-21 MED ORDER — IOHEXOL 350 MG/ML SOLN
INTRAVENOUS | Status: DC | PRN
  Administered 2018-04-21: 110 mL via INTRA_ARTERIAL

## 2018-04-21 MED ORDER — MIDAZOLAM HCL 2 MG/2ML IJ SOLN
INTRAMUSCULAR | Status: AC
  Filled 2018-04-21: qty 2

## 2018-04-21 MED ORDER — FUROSEMIDE 10 MG/ML IJ SOLN
INTRAMUSCULAR | Status: AC
  Filled 2018-04-21: qty 4

## 2018-04-21 MED ORDER — FENTANYL CITRATE (PF) 100 MCG/2ML IJ SOLN
INTRAMUSCULAR | Status: AC
  Filled 2018-04-21: qty 2

## 2018-04-21 MED ORDER — LIDOCAINE HCL (PF) 1 % IJ SOLN
INTRAMUSCULAR | Status: DC | PRN
  Administered 2018-04-21 (×2): 3 mL

## 2018-04-21 MED ORDER — HEPARIN (PORCINE) IN NACL 1000-0.9 UT/500ML-% IV SOLN
INTRAVENOUS | Status: DC | PRN
  Administered 2018-04-21 (×2): 500 mL

## 2018-04-21 MED ORDER — LIDOCAINE HCL (PF) 1 % IJ SOLN
INTRAMUSCULAR | Status: AC
  Filled 2018-04-21: qty 30

## 2018-04-21 MED ORDER — SODIUM CHLORIDE 0.9% FLUSH
3.0000 mL | Freq: Two times a day (BID) | INTRAVENOUS | Status: DC
  Administered 2018-04-22: 3 mL via INTRAVENOUS

## 2018-04-21 MED ORDER — MIDAZOLAM HCL 10 MG/2ML IJ SOLN
INTRAMUSCULAR | Status: DC | PRN
  Administered 2018-04-21 (×2): 2 mg via INTRAVENOUS
  Administered 2018-04-21: 1 mg via INTRAVENOUS

## 2018-04-21 MED ORDER — HEPARIN (PORCINE) IN NACL 100-0.45 UNIT/ML-% IJ SOLN
1800.0000 [IU]/h | INTRAMUSCULAR | Status: DC
  Administered 2018-04-21: 1400 [IU]/h via INTRAVENOUS
  Filled 2018-04-21: qty 250

## 2018-04-21 MED ORDER — FENTANYL CITRATE (PF) 100 MCG/2ML IJ SOLN
INTRAMUSCULAR | Status: DC | PRN
  Administered 2018-04-21: 50 ug via INTRAVENOUS

## 2018-04-21 MED ORDER — MIDAZOLAM HCL 2 MG/2ML IJ SOLN
INTRAMUSCULAR | Status: DC | PRN
  Administered 2018-04-21: 1 mg via INTRAVENOUS

## 2018-04-21 MED ORDER — SODIUM CHLORIDE 0.9 % IV SOLN: 250.0000 mL | INTRAVENOUS | Status: DC | PRN

## 2018-04-21 MED ORDER — LISINOPRIL 40 MG PO TABS
40.0000 mg | ORAL_TABLET | Freq: Every day | ORAL | Status: DC
  Administered 2018-04-22: 40 mg via ORAL
  Filled 2018-04-21: qty 1

## 2018-04-21 MED ORDER — VERAPAMIL HCL 2.5 MG/ML IV SOLN
INTRAVENOUS | Status: AC
  Filled 2018-04-21: qty 2

## 2018-04-21 MED ORDER — FENTANYL CITRATE (PF) 100 MCG/2ML IJ SOLN
INTRAMUSCULAR | Status: AC
  Filled 2018-04-21: qty 4

## 2018-04-21 MED ORDER — ASPIRIN 81 MG PO CHEW: 81.0000 mg | CHEWABLE_TABLET | ORAL | Status: DC

## 2018-04-21 MED ORDER — VERAPAMIL HCL 2.5 MG/ML IV SOLN
INTRAVENOUS | Status: DC | PRN
  Administered 2018-04-21: 10 mL via INTRA_ARTERIAL

## 2018-04-21 MED ORDER — SODIUM CHLORIDE 0.9 % IV SOLN: INTRAVENOUS | Status: DC

## 2018-04-21 MED ORDER — HEPARIN (PORCINE) IN NACL 1000-0.9 UT/500ML-% IV SOLN
INTRAVENOUS | Status: AC
  Filled 2018-04-21: qty 1000

## 2018-04-21 MED ORDER — SODIUM CHLORIDE 0.9 % IV SOLN
INTRAVENOUS | Status: DC
  Administered 2018-04-21: 500 mL via INTRAVENOUS

## 2018-04-21 MED ORDER — LIDOCAINE VISCOUS HCL 2 % MT SOLN
OROMUCOSAL | Status: DC | PRN
  Administered 2018-04-21: 10 mL via OROMUCOSAL

## 2018-04-21 MED ORDER — MIDAZOLAM HCL 5 MG/ML IJ SOLN
INTRAMUSCULAR | Status: AC
  Filled 2018-04-21: qty 2

## 2018-04-21 MED ORDER — HEPARIN SODIUM (PORCINE) 1000 UNIT/ML IJ SOLN
INTRAMUSCULAR | Status: DC | PRN
  Administered 2018-04-21: 5000 [IU] via INTRAVENOUS

## 2018-04-21 MED ORDER — DIPHENHYDRAMINE HCL 50 MG/ML IJ SOLN
INTRAMUSCULAR | Status: DC | PRN
  Administered 2018-04-21: 25 mg via INTRAVENOUS

## 2018-04-21 MED ORDER — LIDOCAINE VISCOUS HCL 2 % MT SOLN
OROMUCOSAL | Status: AC
  Filled 2018-04-21: qty 15

## 2018-04-21 MED ORDER — SODIUM CHLORIDE 0.9% FLUSH: 3.0000 mL | INTRAVENOUS | Status: DC | PRN

## 2018-04-21 MED ORDER — METOPROLOL SUCCINATE ER 50 MG PO TB24
50.0000 mg | ORAL_TABLET | Freq: Every day | ORAL | Status: DC
  Administered 2018-04-22: 50 mg via ORAL
  Filled 2018-04-21: qty 1

## 2018-04-21 MED ORDER — ACETAMINOPHEN 325 MG PO TABS: 650.0000 mg | ORAL_TABLET | ORAL | Status: DC | PRN

## 2018-04-21 MED ORDER — FENTANYL CITRATE (PF) 100 MCG/2ML IJ SOLN
INTRAMUSCULAR | Status: DC | PRN
  Administered 2018-04-21 (×3): 25 ug via INTRAVENOUS

## 2018-04-21 SURGICAL SUPPLY — 13 items
CATH 5FR JL3.5 JR4 ANG PIG MP (CATHETERS) ×2 IMPLANT
CATH BALLN WEDGE 5F 110CM (CATHETERS) ×2 IMPLANT
CATH INFINITI 5 FR MPA2 (CATHETERS) ×2 IMPLANT
CATH LAUNCHER 6FR EBU3.5 (CATHETERS) ×2 IMPLANT
DEVICE RAD COMP TR BAND LRG (VASCULAR PRODUCTS) ×2 IMPLANT
GLIDESHEATH SLEND SS 6F .021 (SHEATH) ×2 IMPLANT
GUIDEWIRE INQWIRE 1.5J.035X260 (WIRE) ×1 IMPLANT
INQWIRE 1.5J .035X260CM (WIRE) ×2
KIT HEART LEFT (KITS) ×2 IMPLANT
PACK CARDIAC CATHETERIZATION (CUSTOM PROCEDURE TRAY) ×2 IMPLANT
SHEATH GLIDE SLENDER 4/5FR (SHEATH) ×2 IMPLANT
TRANSDUCER W/STOPCOCK (MISCELLANEOUS) ×2 IMPLANT
TUBING CIL FLEX 10 FLL-RA (TUBING) ×2 IMPLANT

## 2018-04-21 NOTE — Progress Notes (Signed)
Per pt he took a low dose 81mg  ASA this morning at 0715 with a small sip of water/brt, rn

## 2018-04-21 NOTE — CV Procedure (Addendum)
   Transesophageal Echocardiogram  Indications: AFIB  Time out performed  During this procedure the patient is administered a total of Versed 5 mg and Fentanyl 75 mcg Benadryl 25mg  to achieve and maintain moderate conscious sedation.  The patient's heart rate, blood pressure, and oxygen saturation are monitored continuously during the procedure. The period of conscious sedation is 30 minutes, of which I was present face-to-face 100% of this time.  Findings:  Left Ventricle: EF 40-45% Appears moderately reduced (AFIB with RVR noted)  Mitral Valve:  No prolapse. Moderate to severe regurgitation  Aortic Valve: Trileaflet, moderate to severe regurgitation  Tricuspid Valve: Mild regurgitation  Left Atrium: dilated. No thrombus, no mass.   Bubble Contrast Study: Negative. No shunt  Now getting cath.   Candee Furbish, MD

## 2018-04-21 NOTE — Brief Op Note (Signed)
BRIEF CARDIAC CATHETERIZATION NOTE  DATE: 04/21/2018 TIME: 3:15 PM  PATIENT:  [name]  67 y.o. male  PRE-OPERATIVE DIAGNOSIS:  Atrial fibrillation  POST-OPERATIVE DIAGNOSIS:  Acute on chronic systolic heart failure and atrial fibrillation  PROCEDURE:  Procedure(s): RIGHT/LEFT HEART CATH AND CORONARY ANGIOGRAPHY (N/A)  SURGEON:  Surgeon(s) and Role:    * Terri Rorrer, MD - Primary  FINDINGS: 1. No significant CAD. 2. Moderately elevated left and right heart filling pressures. 3. Moderately reduced Fick cardiac output.  RECOMMENDATIONS: 1. Admit for IV diuresis and medical optimization. 2. Start heparin infusion 2 hours after removal of TR band. 3. If no bleeding or vascular complication, can restart rivaroxaban tomorrow.  Nelva Bush, MD [facility] Pager: [phone]

## 2018-04-21 NOTE — Progress Notes (Signed)
TR BAND REMOVAL  LOCATION: Right   radial  DEFLATED PER PROTOCOL:     TIME BAND OFF / DRESSING APPLIED:   1730   SITE UPON ARRIVAL:    Level 0  SITE AFTER BAND REMOVAL:    Level 0  CIRCULATION SENSATION AND MOVEMENT:    Within Normal Limits :  COMMENTS:

## 2018-04-21 NOTE — Interval H&P Note (Signed)
History and Physical Interval Note:  04/21/2018 10:37 AM  Birder Robson  has presented today for surgery, with the diagnosis of atrial fibrillation and pre cardiac surgery  The various methods of treatment have been discussed with the patient and family. After consideration of risks, benefits and other options for treatment, the patient has consented to  Procedure(s): TRANSESOPHAGEAL ECHOCARDIOGRAM (TEE) (N/A) as a surgical intervention .  The patient's history has been reviewed, patient examined, no change in status, stable for surgery.  I have reviewed the patient's chart and labs.  Questions were answered to the patient's satisfaction.     [employer]

## 2018-04-21 NOTE — Progress Notes (Signed)
  Echocardiogram Echocardiogram Transesophageal has been performed.  [name] G Marcelyn Ruppe 04/21/2018, 1:06 PM

## 2018-04-21 NOTE — Progress Notes (Signed)
ANTICOAGULATION CONSULT NOTE - Initial Consult  Pharmacy Consult for heparin Indication: atrial fibrillation  Allergies  Allergen Reactions  . Oxycodone Rash    Can tolerate Hydrocodone    Patient Measurements: Height: 5\' 10"  (177.8 cm) Weight: 281 lb (127.5 kg) IBW/kg (Calculated) : 73 Heparin Dosing Weight: 100kg  Vital Signs: Temp: 98.5 F (36.9 C) (10/09 1223) Temp Source: Oral (10/09 1223) BP: 164/94 (10/09 1520) Pulse Rate: 80 (10/09 1520)  Labs: No results for input(s): HGB, HCT, PLT, APTT, LABPROT, INR, HEPARINUNFRC, HEPRLOWMOCWT, CREATININE, CKTOTAL, CKMB, TROPONINI in the last 72 hours.  Estimated Creatinine Clearance: 101.8 mL/min (by C-G formula based on SCr of 0.97 mg/dL).   Medical History: Past Medical History:  Diagnosis Date  . Arthritis    "joints" (09/26/2013)  . Atrial enlargement, left   . Chronic diastolic congestive heart failure ([place])   . Hearing aid worn    both ears  . Hypertension   . Obesity   . OSA on CPAP    "mask adjusts to what I need" (09/26/2013)  . Persistent atrial fibrillation    cardioversion 06/06/13  . Rotator cuff tear, right dec 2012   physical therapy done, decreased strength  . Wears glasses    Assessment: 67 year old male with history of afib, admitted for cath. Patient is on xarelto prior to admit which was held for cath. Orders to start heparin this evening once TR band removed and possibly restart his xarelto in the morning.   No CAD found on cath, cbc earlier this month was within normal limits. Will monitor heparin using aptt checks given recent xarelto use.   Goal of Therapy:  Aptt goal 66-102s Heparin level 0.3-0.7 units/ml Monitor platelets by anticoagulation protocol: Yes   Plan:  Start heparin infusion at 1400  Units/hr>>1930 tonight Check aptt in 8 hours and daily while on heparin Continue to monitor H&H and platelets  Erin Hearing PharmD., BCPS Clinical Pharmacist 04/21/2018 3:45 PM

## 2018-04-21 NOTE — Interval H&P Note (Signed)
History and Physical Interval Note:  04/21/2018 11:44 AM  [name]  has presented today for surgery, with the diagnosis of atrial fibrillation and pre cardiac surgery  The various methods of treatment have been discussed with the patient and family. After consideration of risks, benefits and other options for treatment, the patient has consented to  Procedure(s): TRANSESOPHAGEAL ECHOCARDIOGRAM (TEE) (N/A) as a surgical intervention .  The patient's history has been reviewed, patient examined, no change in status, stable for surgery.  I have reviewed the patient's chart and labs.  Questions were answered to the patient's satisfaction.     [employer]

## 2018-04-21 NOTE — Interval H&P Note (Signed)
History and Physical Interval Note:  04/21/2018 1:41 PM  [name]  has presented today for cardiac catheterization, with the diagnosis of afib, pre cardiac surgery. The various methods of treatment have been discussed with the patient and family. After consideration of risks, benefits and other options for treatment, the patient has consented to  Procedure(s): RIGHT/LEFT HEART CATH AND CORONARY ANGIOGRAPHY (N/A) as a surgical intervention .  The patient's history has been reviewed, patient examined, no change in status, stable for surgery.  I have reviewed the patient's chart and labs.  Questions were answered to the patient's satisfaction.    Cath Lab Visit (complete for each Cath Lab visit)  Clinical Evaluation Leading to the Procedure:   ACS: No.  Non-ACS:    Anginal Classification: CCS II  Anti-ischemic medical therapy: Minimal Therapy (1 class of medications)  Non-Invasive Test Results: No non-invasive testing performed  Prior CABG: No previous CABG  [name]

## 2018-04-22 ENCOUNTER — Encounter (HOSPITAL_COMMUNITY): Payer: Self-pay | Admitting: Internal Medicine

## 2018-04-22 DIAGNOSIS — I4819 Other persistent atrial fibrillation: Secondary | ICD-10-CM | POA: Diagnosis not present

## 2018-04-22 LAB — BASIC METABOLIC PANEL
Anion gap: 9 (ref 5–15)
BUN: 17 mg/dL (ref 8–23)
CALCIUM: 8.8 mg/dL — AB (ref 8.9–10.3)
CHLORIDE: 101 mmol/L (ref 98–111)
CO2: 31 mmol/L (ref 22–32)
CREATININE: 1.2 mg/dL (ref 0.61–1.24)
GFR calc non Af Amer: >60 mL/min (ref >60)
GLUCOSE: 141 mg/dL — AB (ref 70–99)
Potassium: 3.1 mmol/L — ABNORMAL LOW (ref 3.5–5.1)
Sodium: 141 mmol/L (ref 135–145)

## 2018-04-22 LAB — HEPARIN LEVEL (UNFRACTIONATED): HEPARIN UNFRACTIONATED: 0.13 [IU]/mL — AB (ref 0.30–0.70)

## 2018-04-22 LAB — APTT
aPTT: 43 seconds — ABNORMAL HIGH (ref 24–36)
aPTT: 44 seconds — ABNORMAL HIGH (ref 24–36)

## 2018-04-22 MED ORDER — FUROSEMIDE 40 MG PO TABS: 40.0000 mg | ORAL_TABLET | Freq: Every day | ORAL | Status: DC

## 2018-04-22 NOTE — Discharge Summary (Addendum)
Discharge Summary    Patient ID: [name] MRN: [id number]; DOB: Jan 21, 1951  Admit date: 04/21/2018 Discharge date: 04/22/2018  Primary Care Provider: Seward Carol, MD  Primary Cardiologist: Quay Burow, MD  Primary Electrophysiologist:  Thompson Grayer, MD  Discharge Diagnoses    Principal Problem:   Atrial fibrillation, persistent Active Problems:   Acute on chronic systolic heart failure (HCC)   Intermittent LBBB   Moderate to severe aortic regurgitation    Moderate to severe mitral regurgitation   Hypokalemia  Allergies Allergies  Allergen Reactions  . Oxycodone Rash    Can tolerate Hydrocodone    Diagnostic Studies/Procedures    RIGHT/LEFT HEART CATH AND CORONARY ANGIOGRAPHY  04/21/18  Conclusion   Conclusions: 1. No angiographically significant coronary artery disease. 2. Mildly to moderately elevated left heart filling pressures.  Prominent V-waves and elevated PCWP relative to LVEDP support moderate to severe mitral regurgitation noted on today's TEE. 3. Moderately elevated right heart and pulmonary artery pressures. 4. Moderately reduced Fick cardiac output/index. 5. New LBBB after crossing the aortic valve for left heart catheterization.  Recommendations: 1. Admit for observation to facilitate diuresis and medical optimization of acute on chronic systolic heart failure (LVEF 35-40% by TEE today) with significant valvular heart disease. 2. Monitor on telemetry, given new LBBB during/after catheterization. 3. Start heparin infusion 2 hours after TR band removal.  If no evidence of bleeding or vascular injury, patient can be transitioned back to rivaroxaban tomorrow.  Recommend to resume Rivaroxaban, at currently prescribed dose and frequency, on 04/22/18 if no evidence of bleeding or vascular injury.  Concurrent antiplatelet therapy not recommended.   Lance Bush, MD [facility] Pager: [phone]   TEE 04/21/18 Study Conclusions  -  Left ventricle: The cavity size was normal. Wall thickness was   normal. Systolic function was moderately reduced. The estimated   ejection fraction was in the range of 35% to 40%. Diffuse   hypokinesis. - Aortic valve: No evidence of vegetation. There was moderate to   severe regurgitation. Regurgitation pressure half-time: 230 ms. - Mitral valve: No evidence of vegetation. There was moderate to   severe regurgitation directed posteriorly. - Left atrium: The atrium was dilated. No evidence of thrombus in   the appendage. - Right atrium: The atrium was dilated. - Tricuspid valve: No evidence of vegetation. - Pulmonic valve: No evidence of vegetation.  Impressions:  - There is moderate to severe aortic regurgitation as well as   moderate to severe mitral regurgitation. EF is also reduced.   Consider valvular repair/replacement.   History of Present Illness     [name] is a 67 yr male with hx of persistent atrial fibrillation, chronic diastolic HF, obesity, OSA on CPAP and HTN presented for R &L cath & TEE.   He was previously on Tikosyn. Seen by Dr. Rayann Heman 12/2017  for symptomatic atrial fibrillation. ablation was declined and the patient wanted to proceed with MAZE. He has been referred to Dr. Roxy Manns for surgical intervention.   Hospital Course     Consultants: None  He presented for schedule study for ongoing evaluation of MAZE. TEE showed moderately reduced LVEF of 35-40% with diffuse hypokinesis (previously normal LVEF in 2018). Moderate to severe aortic regurgitation as well as moderate to severe mitral regurgitation. Recommended valvular repair/replacement. Cath showed no evidence of CAD. Moderately elevated right heart and pulmonary artery pressures. Moderately reduced Fick cardiac output/index. New LBBB after crossing the aortic valve for left heart catheterization. He was admitted  overnight received IV lasix 40mg  x 2 with good diuresis. LBBB resolved on tele. No overnight  complications. Felt stable for discharge by Dr. Harrington Challenger with resumption of home medications (taking lasix 40mg  daily). He has follow up with Dr. Roxy Manns 05/04/18. Needs BMET at follow up.   Discharge Vitals Blood pressure (!) 150/90, pulse 74, temperature 99.4 F (37.4 C), temperature source Oral, resp. rate 19, height 5\' 10"  (1.778 m), weight 124.4 kg, SpO2 95 %.  Filed Weights   04/21/18 1520 04/22/18 0543  Weight: 127.5 kg 124.4 kg    Labs & Radiologic Studies    Basic Metabolic Panel Recent Labs    04/22/18 0946  NA 141  K 3.1*  CL 101  CO2 31  GLUCOSE 141*  BUN 17  CREATININE 1.20  CALCIUM 8.8*    Disposition   Pt is being discharged home today in good condition.  Follow-up Plans & Appointments    Follow-up Information    Rexene Alberts, MD. Go on 05/04/2018.   Specialty:  Cardiothoracic Surgery Why:  @4pm  for follow up Contact information: [place] [address] [phone]        Lorretta Harp, MD. Go on 05/11/2018.   Specialties:  Cardiology, Radiology Why:  @10 :15am for follow up Contact information: [address] [place] [address] [phone]          Discharge Instructions    Diet - low sodium heart healthy   Complete by:  As directed    Increase activity slowly   Complete by:  As directed       Discharge Medications   Allergies as of 04/22/2018      Reactions   Oxycodone Rash   Can tolerate Hydrocodone      Medication List    TAKE these medications   furosemide 40 MG tablet Commonly known as:  LASIX Take 1 tablet (40 mg total) by mouth daily.   KLOR-CON 10 10 MEQ tablet Generic drug:  potassium chloride Take 1 tablet (10 mEq total) by mouth daily. Take 1 extra tablet Mon, Wed, Fri What changed:  additional instructions   KLOR-CON M10 10 MEQ tablet Generic drug:  potassium chloride TAKE 1 TABLET BY MOUTH DAILY. TAKE 1 TABLET EXTRA ON MONDAY, WEDNESDAY&FRIDAY   lisinopril  40 MG tablet Commonly known as:  PRINIVIL,ZESTRIL Take 40 mg by mouth every morning.   metoprolol succinate 50 MG 24 hr tablet Commonly known as:  TOPROL-XL TAKE 1 TABLET (50 MG TOTAL) BY MOUTH DAILY. What changed:  See the new instructions.   PROBIOTIC PO Take 1 capsule by mouth 3 (three) times a week.   XARELTO 20 MG Tabs tablet Generic drug:  rivaroxaban TAKE 1 TABLET (20 MG TOTAL) BY MOUTH DAILY. What changed:  See the new instructions.        Acute coronary syndrome (MI, NSTEMI, STEMI, etc) this admission?: No.    Outstanding Labs/Studies   BEMT during follow up.   Duration of Discharge Encounter   Greater than 30 minutes including physician time.  Jarrett Soho, PA 04/22/2018, 12:01 PM

## 2018-04-22 NOTE — Progress Notes (Signed)
ANTICOAGULATION CONSULT NOTE - Follow Up Consult  Pharmacy Consult for heparin Indication: atrial fibrillation  Labs: Recent Labs    04/22/18 0325  APTT 43*  HEPARINUNFRC 0.13*    Assessment: 67yo male subtherapeutic on heparin with initial dosing while Xarelto on hold post-cath.  Goal of Therapy:  aPTT 66-102 seconds   Plan:  Will increase heparin gtt by 4 units/kgABW/hr to 1800 units/hr and check PTT in 6 hours.    Wynona Neat, PharmD, BCPS  04/22/2018,4:39 AM

## 2018-04-22 NOTE — Progress Notes (Signed)
Patient received discharge information and acknowledged understanding of it. Patient received radial site care. Patient IV was removed.

## 2018-04-22 NOTE — Progress Notes (Signed)
Progress Note  Patient Name: [name] Date of Encounter: 04/22/2018  Primary Cardiologist: Berry/Allred  Subjective   No SOB   No CP    Inpatient Medications    Scheduled Meds: . furosemide  40 mg Intravenous BID  . lisinopril  40 mg Oral Daily  . metoprolol succinate  50 mg Oral Daily  . sodium chloride flush  3 mL Intravenous Q12H   Continuous Infusions: . sodium chloride    . heparin Stopped (04/22/18 0925)   PRN Meds: sodium chloride, acetaminophen, ondansetron (ZOFRAN) IV, sodium chloride flush   Vital Signs    Vitals:   04/21/18 1630 04/21/18 2140 04/22/18 0543 04/22/18 0808  BP:  (!) 150/92  (!) 150/90  Pulse: (!) 104 86  74  Resp: (!) 23 19    Temp:  99.4 F (37.4 C)    TempSrc:  Oral    SpO2: 98% 95%    Weight:   124.4 kg   Height:        Intake/Output Summary (Last 24 hours) at 04/22/2018 0930 Last data filed at 04/22/2018 0615 Gross per 24 hour  Intake 1837.41 ml  Output -  Net 1837.41 ml   Filed Weights   04/21/18 1520 04/22/18 0543  Weight: 127.5 kg 124.4 kg    Telemetry    Afib  Rates 80s to 90s   Narrow complex QRS- Personally Reviewed  ECG     Physical Exam   GEN: No acute distress.   Neck: No JVD Cardiac: Irreg irreg   No S3  Gr II/VI systolic murmur  Gr I diastolic   No rubs, or gallops.  R wrist OK Respiratory: Clear to auscultation bilaterally. GI: Soft, nontender, non-distended  MS: No edema; No deformity. Neuro:  Nonfocal  Psych: Normal affect   Labs    ChemistryNo results for input(s): NA, K, CL, CO2, GLUCOSE, BUN, CREATININE, CALCIUM, PROT, ALBUMIN, AST, ALT, ALKPHOS, BILITOT, GFRNONAA, GFRAA, ANIONGAP in the last 168 hours.   HematologyNo results for input(s): WBC, RBC, HGB, HCT, MCV, MCH, MCHC, RDW, PLT in the last 168 hours.  Cardiac EnzymesNo results for input(s): TROPONINI in the last 168 hours. No results for input(s): TROPIPOC in the last 168 hours.   BNPNo results for input(s): BNP, PROBNP in the  last 168 hours.   DDimer No results for input(s): DDIMER in the last 168 hours.   Radiology    No results found.  Cardiac Studies    Cardiac Cath 04/21/18 L heart cath   Normal coronary aryteries    Right Heart Pressures RA (mean): 16 mmHg RV (S/EDP): 52/16 mmHg PA (S/D, mean): 52/33 (39) mmHg PCWP (mean): 25-30 mmHg with prominent V-waves up to 45 mmHg.  Ao sat: 91% PA sat: 56%  Fick CO: 4.3 L/min Fick CI: 1.8 L/min/m^2   LBBB during and after procedure    TEE (10/9/19_   - Left ventricle: The cavity size was normal. Wall thickness was   normal. Systolic function was moderately reduced. The estimated   ejection fraction was in the range of 35% to 40%. Diffuse   hypokinesis. - Aortic valve: No evidence of vegetation. There was moderate to   severe regurgitation. Regurgitation pressure half-time: 230 ms. - Mitral valve: No evidence of vegetation. There was moderate to   severe regurgitation directed posteriorly. - Left atrium: The atrium was dilated. No evidence of thrombus in   the appendage. - Right atrium: The atrium was dilated. - Tricuspid valve: No evidence of vegetation. -  Pulmonic valve: No evidence of vegetation.  Impressions:  - There is moderate to severe aortic regurgitation as well as   moderate to severe mitral regurgitation. EF is also reduced.   Consider valvular repair/replacement.   Patient Profile     67 y.o. male hx of atrial fibrillation and mitral regurgitation   Now s/p R and L heart cath   Assessment & Plan    1  Afib  Rates controlled  Resume NOAC  2   Valvular dz   Pt with severe MR  At least Mod AI     Received 2 doses of IV lasix while here   WIll resume home dose   Until valve repaired, pressures will be increased  3   LBBB   Resolved   Narrow complex on tele  D/C today   F/U already arranged with Roxy Manns and Gwenlyn Found     For questions or updates, please contact [place] Please consult [email or web address] for contact info  under        Signed, Dorris Carnes, MD  04/22/2018, 9:30 AM

## 2018-04-23 ENCOUNTER — Encounter (HOSPITAL_COMMUNITY): Payer: Self-pay | Admitting: Cardiology

## 2018-04-29 ENCOUNTER — Telehealth: Payer: Self-pay | Admitting: Cardiovascular Disease

## 2018-04-29 ENCOUNTER — Encounter: Payer: Self-pay | Admitting: Adult Health

## 2018-04-29 ENCOUNTER — Ambulatory Visit: Payer: Medicare HMO | Admitting: Adult Health

## 2018-04-29 ENCOUNTER — Other Ambulatory Visit: Payer: Self-pay | Admitting: Adult Health

## 2018-04-29 VITALS — BP 162/84 | HR 105 | Temp 98.8°F | Ht 70.0 in | Wt 279.0 lb

## 2018-04-29 DIAGNOSIS — Z79899 Other long term (current) drug therapy: Secondary | ICD-10-CM | POA: Diagnosis not present

## 2018-04-29 DIAGNOSIS — I4819 Other persistent atrial fibrillation: Secondary | ICD-10-CM | POA: Diagnosis not present

## 2018-04-29 DIAGNOSIS — R21 Rash and other nonspecific skin eruption: Secondary | ICD-10-CM | POA: Diagnosis not present

## 2018-04-29 DIAGNOSIS — I5022 Chronic systolic (congestive) heart failure: Secondary | ICD-10-CM

## 2018-04-29 DIAGNOSIS — R059 Cough, unspecified: Secondary | ICD-10-CM

## 2018-04-29 DIAGNOSIS — R05 Cough: Secondary | ICD-10-CM

## 2018-04-29 MED ORDER — HYDROCOD POLST-CPM POLST ER 10-8 MG/5ML PO SUER: 5.0000 mL | Freq: Two times a day (BID) | ORAL | 0 refills | Status: DC | PRN

## 2018-04-29 MED ORDER — METHYLPREDNISOLONE 4 MG PO TBPK: ORAL_TABLET | ORAL | 0 refills | Status: DC

## 2018-04-29 MED ORDER — BENZONATATE 100 MG PO CAPS: 100.0000 mg | ORAL_CAPSULE | Freq: Three times a day (TID) | ORAL | 1 refills | Status: DC | PRN

## 2018-04-29 NOTE — Patient Instructions (Signed)
Medication Instructions:  TAKE TESSALON THREE TIME DAILY AS NEEDED  If you need a refill on your cardiac medications before your next appointment, please call your pharmacy.  Labwork: BMET,SED RATE AND CBC TODAY HERE IN OUR OFFICE AT [facility]  Take the provided lab slips with you to the lab for your blood draw.   If you have labs (blood work) drawn today and your tests are completely normal, you will receive your results only by: Marland Kitchen MyChart Message (if you have MyChart) OR . A paper copy in the mail If you have any lab test that is abnormal or we need to change your treatment, we will call you to review the results.  Special Instructions: CALL AND SCHEDULE APPT WITH PRIMARY CARE  Follow-Up: . You will need a follow up appointment in [place]. You may see  DR Gwenlyn Found or one of the following Advanced Practice Providers on your designated Care Team:   . Kerin Ransom, PA-C  At [facility], you and your health needs are our priority.  As part of our continuing mission to provide you with exceptional heart care, we have created designated Provider Care Teams.  These Care Teams include your primary Cardiologist (physician) and Advanced Practice Providers (APPs -  Physician Assistants and Nurse Practitioners) who all work together to provide you with the care you need, when you need it.  Thank you for choosing [facility] at [facility]!!

## 2018-04-29 NOTE — Telephone Encounter (Signed)
Spoke with pt.pt sts that he has had a non-productive cough since his TEE on 10/9. Pt sts that the cough has gotten progressively worse. He denies fever, chills, n/v. He has tried OTc remedies with no improvement.  During the call the pt cough not complete a sentence without coughing. It is keeping him up at night. recommended he be seen by his pcp, pt became upset and sts that we did the procedure we need to take care of him. Adv pt that I would be happy to schedule an appt here, offered an appt today @ 11:30am with Jory Sims. D-NP. Pt agreeable and appreciative.

## 2018-04-29 NOTE — Telephone Encounter (Signed)
° ° °  Patient states he has had a cough since TEE.  Patient wants to know if cough is normal after TEE

## 2018-04-29 NOTE — Progress Notes (Signed)
Cardiology Office Note   Date:  04/29/2018   ID:  ZAVIYAR RAHAL, DOB April 11, 1951, MRN [id number]  PCP:  Seward Carol, MD  Cardiologist: Dr.Berry Electrophysiologist: Dr. Rayann Heman   Chief Complaint  Patient presents with  . Rash  . Cough  . Atrial Fibrillation     History of Present Illness: [name] is a 67 y.o. male who presents for post procedure follow up with complaints of frequent coughing and throat irritation since having TEE and cardiac cath. He was found to have severe mitral valve regurgitation and severe AoV regurgitation. EF of  35%-40%. He is being evaluated for MAZE procedure.   Cardiac cath did not show evidence of CAD. Hie was found to have moderately elevated pulmonary pressures. He received IV lasix during hospitalization and was continued  on po lasix 40 mg daily along with potassium,lisinopril 40 mg daily, and metoprolol. He remains on Xarelto.   He states that since having the procedure he has had a cough and also broke out in a rash. He has been on the ACE for 8 years. He states that the cough is coming from lower chest as well as throat irritation. He thought this would pass after a few days, thinking that it was related to the  TEE.  He denies trouble breathing, tongue swelling or edema.   Past Medical History:  Diagnosis Date  . Arthritis    "joints" (09/26/2013)  . Atrial enlargement, left   . Chronic diastolic congestive heart failure ([place])   . Hearing aid worn    both ears  . Hypertension   . Obesity   . OSA on CPAP    "mask adjusts to what I need" (09/26/2013)  . Persistent atrial fibrillation    cardioversion 06/06/13  . Rotator cuff tear, right dec 2012   physical therapy done, decreased strength  . Wears glasses     Past Surgical History:  Procedure Laterality Date  . CARDIOVERSION N/A 06/06/2013   Procedure: CARDIOVERSION;  Surgeon: Sanda Klein, MD;  Location: [facility];  Service: Cardiovascular;  Laterality: N/A;  .  CARDIOVERSION N/A 09/28/2013   Procedure: CARDIOVERSION BEDSIDE;  Surgeon: Peter M Martinique, MD;  Location: [place];  Service: Cardiovascular;  Laterality: N/A;  . CARPAL TUNNEL RELEASE Right 08/08/2013   Procedure: RIGHT CARPAL TUNNEL RELEASE;  Surgeon: Wynonia Sours, MD;  Location: [place];  Service: Orthopedics;  Laterality: Right;  . CARPAL TUNNEL RELEASE Left 2008  . KNEE ARTHROSCOPY Right 1990's  . RIGHT/LEFT HEART CATH AND CORONARY ANGIOGRAPHY N/A 04/21/2018   Procedure: RIGHT/LEFT HEART CATH AND CORONARY ANGIOGRAPHY;  Surgeon: Nelva Bush, MD;  Location: [place] [facility];  Service: Cardiovascular;  Laterality: N/A;  . SHOULDER OPEN ROTATOR CUFF REPAIR Left 1990's  . SHOULDER OPEN ROTATOR CUFF REPAIR Right 11/2007  . TEE WITHOUT CARDIOVERSION N/A 04/21/2018   Procedure: TRANSESOPHAGEAL ECHOCARDIOGRAM (TEE);  Surgeon: Jerline Pain, MD;  Location: [facility];  Service: Cardiovascular;  Laterality: N/A;  . TONSILLECTOMY  1950's  . TOTAL HIP ARTHROPLASTY Left 2010  . TOTAL HIP REVISION Left 10/08/2011  . TOTAL HIP REVISION  04/09/2012   Procedure: TOTAL HIP REVISION;  Surgeon: Gearlean Alf, MD;  Location: [facility];  Service: Orthopedics;  Laterality: Left;  Left Hip Acetabular Revision vs Constrained Liner  . TOTAL KNEE ARTHROPLASTY Right 2006   . TRIGGER FINGER RELEASE Right 08/08/2013   Procedure: RELEASE STENOSING TENOSYNOVITIS RIGHT THUMB;  Surgeon: Wynonia Sours, MD;  Location: [facility];  Service: Orthopedics;  Laterality: Right;     Current Outpatient Medications  Medication Sig Dispense Refill  . furosemide (LASIX) 40 MG tablet Take 1 tablet (40 mg total) by mouth daily.    Marland Kitchen KLOR-CON 10 10 MEQ tablet Take 1 tablet (10 mEq total) by mouth daily. Take 1 extra tablet Mon, Wed, Fri (Patient taking differently: Take 10 mEq by mouth daily. ) 48 tablet 6  . KLOR-CON M10 10 MEQ tablet TAKE 1 TABLET BY MOUTH DAILY. TAKE 1 TABLET EXTRA ON MONDAY,  WEDNESDAY&FRIDAY 48 tablet 6  . lisinopril (PRINIVIL,ZESTRIL) 40 MG tablet Take 40 mg by mouth every morning.    . metoprolol succinate (TOPROL-XL) 50 MG 24 hr tablet TAKE 1 TABLET (50 MG TOTAL) BY MOUTH DAILY. (Patient taking differently: Take 50 mg by mouth daily. Pt may take an additional dose if needed for tachycardia) 30 tablet 1  . Probiotic Product (PROBIOTIC PO) Take 1 capsule by mouth 3 (three) times a week.     [name] 20 MG TABS tablet TAKE 1 TABLET (20 MG TOTAL) BY MOUTH DAILY. (Patient taking differently: Take 20 mg by mouth daily with supper. ) 30 tablet 6   No current facility-administered medications for this visit.     Allergies:   Oxycodone    Social History:  The patient  reports that he has never smoked. He has never used smokeless tobacco. He reports that he does not drink alcohol or use drugs.   Family History:  The patient's family history includes Other in his unknown relative.    ROS: All other systems are reviewed and negative. Unless otherwise mentioned in H&P    PHYSICAL EXAM: VS:  BP (!) 162/84   Pulse (!) 105   Temp 98.8 F (37.1 C)   Ht 5\' 10"  (1.778 m)   Wt 279 lb (126.6 kg)   BMI 40.03 kg/m  , BMI Body mass index is 40.03 kg/m. GEN: Well nourished, well developed, in no acute distress HEENT: normal Neck: no JVD, carotid bruits, or masses Cardiac: IRRR; no murmurs, rubs, or gallops,no edema  Respiratory:  Clear to auscultation bilaterally, normal work of breathing.Frequent coughing Non-productive.  GI: soft, nontender, nondistended, + BS MS: no deformity or atrophy Skin: warm and dry, no rash, non-papular rash is noted on torso and arms.  Neuro:  Strength and sensation are intact Psych: euthymic mood, full affect   EKG: Atrial fib rate of 105 bpm.  Recent Labs: 04/14/2018: ALT 43; Hemoglobin 15.6; Platelets 143 04/22/2018: BUN 17; Creatinine, Ser 1.20; Potassium 3.1; Sodium 141    Lipid Panel No results found for: CHOL, TRIG, HDL,  CHOLHDL, VLDL, LDLCALC, LDLDIRECT    Wt Readings from Last 3 Encounters:  04/29/18 279 lb (126.6 kg)  04/22/18 274 lb 3.2 oz (124.4 kg)  04/14/18 281 lb (127.5 kg)      Other studies Reviewed: Cardiac Cath 04/14/2018 Conclusions: 1. No angiographically significant coronary artery disease. 2. Mildly to moderately elevated left heart filling pressures.  Prominent V-waves and elevated PCWP relative to LVEDP support moderate to severe mitral regurgitation noted on today's TEE. 3. Moderately elevated right heart and pulmonary artery pressures. 4. Moderately reduced Fick cardiac output/index. 5. New LBBB after crossing the aortic valve for left heart catheterization.  Recommendations: 1. Admit for observation to facilitate diuresis and medical optimization of acute on chronic systolic heart failure (LVEF 35-40% by TEE today) with significant valvular heart disease. 2. Monitor on telemetry, given new LBBB during/after  catheterization. 3. Start heparin infusion 2 hours after TR band removal.  If no evidence of bleeding or vascular injury, patient can be transitioned back to rivaroxaban tomorrow.  TEE 04/21/2018 Left ventricle: The cavity size was normal. Wall thickness was   normal. Systolic function was moderately reduced. The estimated   ejection fraction was in the range of 35% to 40%. Diffuse   hypokinesis. - Aortic valve: No evidence of vegetation. There was moderate to   severe regurgitation. Regurgitation pressure half-time: 230 ms. - Mitral valve: No evidence of vegetation. There was moderate to   severe regurgitation directed posteriorly. - Left atrium: The atrium was dilated. No evidence of thrombus in   the appendage. - Right atrium: The atrium was dilated. - Tricuspid valve: No evidence of vegetation. - Pulmonic valve: No evidence of vegetation.  Impressions:  - There is moderate to severe aortic regurgitation as well as   moderate to severe mitral regurgitation. EF is  also reduced.   Consider valvular repair/replacement.  ASSESSMENT AND PLAN:  1. Cough; Began after TEE but has not subsided, and in fact, got worse, He states he cannot talk or take deep breaths without coughing. He is sore in his abdomen from coughing with overt chest pain or dyspnea,     I will give him Rx for Tussirex Q 12 hours, Tessalon Perles 200 mg Q 8 hours. If no improvement in his symptoms, can consider stopping ACE.   He is to see PCP within a few days for follow up.   2. Rash: Uncertain if allergic reaction to Fentanyl given during procedure, but this is a late presentation. Will begin  He will be started on a steroid dose pack, 4 mg for 21 days.     3. Hypertension: Elevated today. He has not taken lasix due to this office appointment.   4. Atrial fib; Planned for MAZE procedure. Due to follow up with CVTS for further planning and recommendations.   5. Severe AoV Regurgitation: Will be seen by CVTS for further discussion and management.   6. OSA: Cannot use CPAP at this time due to frequent coughing.   Current medicines are reviewed at length with the patient today.    Labs/ tests ordered today include: BMET, CBC, and Sed Rate.   Phill Myron. West Pugh, ANP, AACC   04/29/2018 11:54 AM    [place] [address] Office [phone] Fax [phone]

## 2018-04-30 LAB — BASIC METABOLIC PANEL
BUN / CREAT RATIO: 16 (ref 10–24)
BUN: 17 mg/dL (ref 8–27)
CO2: 24 mmol/L (ref 20–29)
CREATININE: 1.08 mg/dL (ref 0.76–1.27)
Calcium: 8.8 mg/dL (ref 8.6–10.2)
Chloride: 100 mmol/L (ref 96–106)
GFR calc Af Amer: 83 mL/min/{1.73_m2} (ref >59)
GFR calc non Af Amer: 72 mL/min/{1.73_m2} (ref >59)
GLUCOSE: 82 mg/dL (ref 65–99)
Potassium: 4.2 mmol/L (ref 3.5–5.2)
Sodium: 142 mmol/L (ref 134–144)

## 2018-04-30 LAB — CBC
Hematocrit: 44 % (ref 37.5–51.0)
Hemoglobin: 14.8 g/dL (ref 13.0–17.7)
MCH: 27.9 pg (ref 26.6–33.0)
MCHC: 33.6 g/dL (ref 31.5–35.7)
MCV: 83 fL (ref 79–97)
Platelets: 161 10*3/uL (ref 150–450)
RBC: 5.3 x10E6/uL (ref 4.14–5.80)
RDW: 13.9 % (ref 12.3–15.4)
WBC: 7.7 10*3/uL (ref 3.4–10.8)

## 2018-04-30 LAB — SEDIMENTATION RATE: Sed Rate: 5 mm/hr (ref 0–30)

## 2018-05-04 ENCOUNTER — Ambulatory Visit
Admission: RE | Admit: 2018-05-04 | Discharge: 2018-05-04 | Disposition: A | Discharge status: Home / Self Care | Payer: Medicare HMO | Source: Ambulatory Visit | Attending: Thoracic Surgery (Cardiothoracic Vascular Surgery) | Admitting: Thoracic Surgery (Cardiothoracic Vascular Surgery)

## 2018-05-04 ENCOUNTER — Ambulatory Visit: Payer: Medicare HMO | Admitting: Thoracic Surgery (Cardiothoracic Vascular Surgery)

## 2018-05-04 ENCOUNTER — Encounter: Payer: Self-pay | Admitting: Thoracic Surgery (Cardiothoracic Vascular Surgery)

## 2018-05-04 ENCOUNTER — Other Ambulatory Visit: Payer: Self-pay | Admitting: *Deleted

## 2018-05-04 ENCOUNTER — Other Ambulatory Visit: Payer: Self-pay

## 2018-05-04 ENCOUNTER — Other Ambulatory Visit: Payer: Self-pay | Admitting: Thoracic Surgery (Cardiothoracic Vascular Surgery)

## 2018-05-04 VITALS — BP 134/60 | HR 87 | Resp 16 | Ht 70.0 in | Wt 279.0 lb

## 2018-05-04 DIAGNOSIS — J189 Pneumonia, unspecified organism: Secondary | ICD-10-CM

## 2018-05-04 DIAGNOSIS — I4819 Other persistent atrial fibrillation: Secondary | ICD-10-CM | POA: Diagnosis not present

## 2018-05-04 DIAGNOSIS — I340 Nonrheumatic mitral (valve) insufficiency: Secondary | ICD-10-CM | POA: Diagnosis not present

## 2018-05-04 DIAGNOSIS — I5032 Chronic diastolic (congestive) heart failure: Secondary | ICD-10-CM

## 2018-05-04 DIAGNOSIS — I351 Nonrheumatic aortic (valve) insufficiency: Secondary | ICD-10-CM | POA: Diagnosis not present

## 2018-05-04 DIAGNOSIS — Z01818 Encounter for other preprocedural examination: Secondary | ICD-10-CM

## 2018-05-04 DIAGNOSIS — R05 Cough: Secondary | ICD-10-CM | POA: Diagnosis not present

## 2018-05-04 DIAGNOSIS — I5023 Acute on chronic systolic (congestive) heart failure: Secondary | ICD-10-CM

## 2018-05-04 NOTE — Patient Instructions (Signed)
Stop taking Xarelto 7 days prior to surgery (10/29)  Continue taking all other medications without change through the day before surgery.  Have nothing to eat or drink after midnight the night before surgery.  On the morning of surgery take only Toprol XL with a sip of water.

## 2018-05-04 NOTE — Progress Notes (Addendum)
[address]       [place] [address]             [phone]     CARDIOTHORACIC SURGERY OFFICE NOTE  Referring Provider is Thompson Grayer, MD PCP is Seward Carol, MD   HPI:  Patient is a 67 year old obese white male with history of long-standing persistent atrial fibrillation, hypertension, chronic diastolic congestive heart failure, and obstructive sleep apnea who returns to the office today to discuss surgical options for treatment of atrial fibrillation.  He was originally seen in consultation on February 02, 2018.  At that time the patient wanted to wait until the fall to proceed with further diagnostic work-up and possible surgery.  He was last seen in our office on April 12, 2018.  Since then he underwent transesophageal echocardiogram and diagnostic cardiac catheterization to rule out the presence of significant structural heart disease and/or coronary artery disease.  TEE performed April 21, 2018 by Dr. Marlou Porch revealed moderate to severe aortic insufficiency and moderate to severe mitral regurgitation.  There was left atrium chamber enlargement.  There was no thrombus in the left atrial appendage.  LV systolic function was moderately reduced with ejection fraction estimated 35 to 40%.  There were no significant wall motion abnormalities.  Left and right heart catheterization performed by Dr. and revealed no significant coronary artery disease but confirmed the presence of moderate pulmonary hypertension and moderately elevated left heart filling pressures with large V waves on wedge tracing consistent with severe mitral regurgitation.    Patient returns the office today to discuss the results of these tests and consider treatment options further.  He complains that ever since his transesophageal echocardiogram he has had a persistent dry hacking nonproductive cough.  He states that the first couple of days the cough was quite severe and he could barely talk.   Symptoms have improved and he was given a prescription for Tessalon Perles, Tussionex, and a steroid Dosepak.  He seems to be improving.  He denies any fevers or chills.  He has not had a productive cough.  He has no difficulty swallowing.  His voice has returned to normal.  The remainder of his review of systems is unchanged from previously.  He continues to experience stable symptoms of exertional shortness of breath and fatigue consistent with chronic diastolic congestive heart failure.  He denies resting shortness of breath, PND, or orthopnea.  He reports occasional mild lower extremity edema.  He is not having chest pain or chest tightness.  He does not experience palpitations, dizzy spells, nor syncope.  Current Outpatient Medications  Medication Sig Dispense Refill  . benzonatate (TESSALON PERLES) 100 MG capsule Take 1 capsule (100 mg total) by mouth 3 (three) times daily as needed for cough. 30 capsule 1  . chlorpheniramine-HYDROcodone (TUSSIONEX PENNKINETIC ER) 10-8 MG/5ML SUER Take 5 mLs by mouth every 12 (twelve) hours as needed for cough. 140 mL 0  . furosemide (LASIX) 40 MG tablet Take 1 tablet (40 mg total) by mouth daily.    Marland Kitchen KLOR-CON 10 10 MEQ tablet Take 1 tablet (10 mEq total) by mouth daily. Take 1 extra tablet Mon, Wed, Fri (Patient taking differently: Take 10 mEq by mouth daily. ) 48 tablet 6  . lisinopril (PRINIVIL,ZESTRIL) 40 MG tablet Take 40 mg by mouth every morning.    . methylPREDNISolone (MEDROL DOSEPAK) 4 MG TBPK tablet TAKE AS ORDERED 1 tablet 0  . metoprolol succinate (TOPROL-XL) 50 MG 24  hr tablet TAKE 1 TABLET (50 MG TOTAL) BY MOUTH DAILY. (Patient taking differently: Take 50 mg by mouth daily. Pt may take an additional dose if needed for tachycardia) 30 tablet 1  . Probiotic Product (PROBIOTIC PO) Take 1 capsule by mouth 3 (three) times a week.     [name] 20 MG TABS tablet TAKE 1 TABLET (20 MG TOTAL) BY MOUTH DAILY. (Patient taking differently: Take 20 mg by mouth  daily with supper. ) 30 tablet 6   No current facility-administered medications for this visit.       Physical Exam:   BP 134/60 (BP Location: Left Arm, Patient Position: Sitting, Cuff Size: Large)   Pulse 87   Resp 16   Ht 5\' 10"  (1.778 m)   Wt 279 lb (126.6 kg)   SpO2 93% Comment: ON RA  BMI 40.03 kg/m   General:  Obese but well-appearing  Chest:   Clear to auscultation  CV:   Regular rate and rhythm without definite appreciable murmur  Incisions:  n/a  Abdomen:  Soft nontender  Extremities:  Warm and well-perfused, trace lower extremity edema  Diagnostic Tests:  Transesophageal Echocardiography  Patient:    [name], [name] MR #:       [id number] Study Date: 04/21/2018 Gender:     M Age:        46 Height:     177.8 cm Weight:     127.5 kg BSA:        2.57 m^2 Pt. Status: Room:   ADMITTING    Candee Furbish, M.D.  ATTENDING    Candee Furbish, M.D.  PERFORMING   Candee Furbish, M.D.  Windell Moment, Marlane Hatcher  REFERRING    Burtis Junes  SONOGRAPHER  Dance, Tiffany  cc:  ------------------------------------------------------------------- LV EF: 35% -   40%  ------------------------------------------------------------------- Indications:      Atrial fibrillation - 427.31.  ------------------------------------------------------------------- History:   PMH:   Congestive heart failure.  PMH:  Left Atrial Enlargement. OSA.  Risk factors:  Hypertension.  ------------------------------------------------------------------- Study Conclusions  - Left ventricle: The cavity size was normal. Wall thickness was   normal. Systolic function was moderately reduced. The estimated   ejection fraction was in the range of 35% to 40%. Diffuse   hypokinesis. - Aortic valve: No evidence of vegetation. There was moderate to   severe regurgitation. Regurgitation pressure half-time: 230 ms. - Mitral valve: No evidence of vegetation. There was moderate to   severe  regurgitation directed posteriorly. - Left atrium: The atrium was dilated. No evidence of thrombus in   the appendage. - Right atrium: The atrium was dilated. - Tricuspid valve: No evidence of vegetation. - Pulmonic valve: No evidence of vegetation.  Impressions:  - There is moderate to severe aortic regurgitation as well as   moderate to severe mitral regurgitation. EF is also reduced.   Consider valvular repair/replacement.  ------------------------------------------------------------------- Study data:   Study status:  Routine.  Consent:  The risks, benefits, and alternatives to the procedure were explained to the patient and informed consent was obtained.  Procedure:  The patient reported no pain pre or post test. Initial setup. The patient was brought to the laboratory. Surface ECG leads were monitored. Sedation. Moderate sedation with intermittent deep sedation was administered by cardiology staff. Transesophageal echocardiography. An adult multiplane transesophageal probe was inserted by the attending cardiologistwith some difficulty. 3D image quality was adequate. Intravenous contrast (agitated saline) was administered. Study completion:  The patient  tolerated the procedure well. There were no complications.  Administered medications:   Diphenhydramine,  25mg , IV.  Fentanyl, 24mcg, IV.  Midazolam, 5mg , IV. Diagnostic transesophageal echocardiography.  2D and color Doppler.  Birthdate:  Patient birthdate: 23-Dec-1950.  Age:  Patient is 67 yr old.  Sex:  Gender: male.    BMI: 40.3 kg/m^2.  Blood pressure: 158/86  Patient status:  Outpatient.  Study date:  Study date: 04/21/2018. Study time: 11:44 AM.  Location:  Endoscopy.  -------------------------------------------------------------------  ------------------------------------------------------------------- Left ventricle:  The cavity size was normal. Wall thickness was normal. Systolic function was moderately reduced.  The estimated ejection fraction was in the range of 35% to 40%. Diffuse hypokinesis.  ------------------------------------------------------------------- Aortic valve:   Mildly thickened leaflets. Cusp separation was normal.  No evidence of vegetation.  Doppler:  There was moderate to severe regurgitation.  ------------------------------------------------------------------- Mitral valve:   Mildly thickened leaflets . Leaflet separation was normal.  No evidence of vegetation.  Doppler:  There was moderate to severe regurgitation directed posteriorly.  ------------------------------------------------------------------- Left atrium:  The atrium was dilated.  No evidence of thrombus in the appendage. The appendage was of normal size. Emptying velocity was normal.  ------------------------------------------------------------------- Right ventricle:  The cavity size was normal. Wall thickness was normal. Systolic function was normal.  ------------------------------------------------------------------- Pulmonic valve:    Structurally normal valve.   Cusp separation was normal.  No evidence of vegetation.  Doppler:  There was trivial regurgitation.  ------------------------------------------------------------------- Tricuspid valve:   Structurally normal valve.   Leaflet separation was normal.  No evidence of vegetation.  Doppler:  There was mild regurgitation.  ------------------------------------------------------------------- Pulmonary artery:   The main pulmonary artery was normal-sized.  ------------------------------------------------------------------- Right atrium:  The atrium was dilated.  ------------------------------------------------------------------- Measurements   Aortic valve                             Value  Aortic regurg pressure half-time         230   ms    Aorta                                    Value  Aortic root ID, ED                       39     mm  Ascending aorta ID, A-P, S               40    mm    Mitral valve                             Value  Mitral maximal regurg velocity, PISA     702   cm/s  Mitral regurg VTI, PISA                  200   cm  Mitral ERO, PISA                         0.49  cm^2  Mitral regurg volume, PISA               98    ml  Legend: (L)  and  (H)  mark values outside specified reference range.  ------------------------------------------------------------------- Prepared and Electronically Authenticated by  Candee Furbish,  M.D. 2019-10-09T14:43:46   RIGHT/LEFT HEART CATH AND CORONARY ANGIOGRAPHY  Conclusion   Conclusions: 1. No angiographically significant coronary artery disease. 2. Mildly to moderately elevated left heart filling pressures.  Prominent V-waves and elevated PCWP relative to LVEDP support moderate to severe mitral regurgitation noted on today's TEE. 3. Moderately elevated right heart and pulmonary artery pressures. 4. Moderately reduced Fick cardiac output/index. 5. New LBBB after crossing the aortic valve for left heart catheterization.  Recommendations: 1. Admit for observation to facilitate diuresis and medical optimization of acute on chronic systolic heart failure (LVEF 35-40% by TEE today) with significant valvular heart disease. 2. Monitor on telemetry, given new LBBB during/after catheterization. 3. Start heparin infusion 2 hours after TR band removal.  If no evidence of bleeding or vascular injury, patient can be transitioned back to rivaroxaban tomorrow.  Recommend to resume Rivaroxaban, at currently prescribed dose and frequency, on 04/22/18 if no evidence of bleeding or vascular injury.  Concurrent antiplatelet therapy not recommended.   Nelva Bush, MD [facility] Pager: [phone]   Indications   Persistent atrial fibrillation [I48.19 (ICD-10-CM)]  Preop cardiovascular exam [Z01.810 (ICD-10-CM)]  Procedural Details/Technique   Technical  Details Indication: 67 y.o. year-old man with history of persistent atrial fibrillation, hypertension, chronic diastolic heart failure, and morbid obesity, presenting for cardiac catheterization due to shortness of breath in the setting of atrial fibrillation and plans for MAZE procedure.  GFR: >60 ml/min  Procedure: The risks, benefits, complications, treatment options, and expected outcomes were discussed with the patient. The patient and/or family concurred with the proposed plan, giving informed consent. The patient was brought to the cath lab after IV hydration was begun and oral premedication was given. The patient was further sedated with Versed and Fentanyl. The right wrist was assessed with a modified Allens test which was normal. The right wrist and elbow were prepped and draped in a sterile fashion. 1% lidocaine was used for local anesthesia. A previously placed antecubital vein IV was exchanged for a 67F slender Glidesheath using modified Seldinger technique. Right heart catheterization was performed by advancing a 67F balloon-tipped catheter through the right heart chambers into the pulmonary capillary wedge position. Pressure measurements and oxygen saturations were obtained.  Using the modified Seldinger access technique, a 7F slender Glidesheath was placed in the right radial artery. 3 mg Verapamil was given through the sheath. Heparin 5,000 units were administered. Selective coronary angiography was performed using 7F EBU3.5 and 67F JR4 catheters to engage the left and right coronary arteries, respectively. Attempts were made to engage the left coronary artery with 67F Jl3.5 and MPA2 catheters, but the left coronary artery could not be adequately opacified. Left heart catheterization was performed using a 67F JR4 catheter. Left ventriculogram was not performed.  At the end of the procedure, the radial artery sheath was removed and a TR band applied to achieve patent hemostasis. Patient was noted  to develop a left bundle branch block after crossing the aortic valve. This persisted throughout the procedure and was asymptomatic. Otherwise, there were no immediate complications. The patient was taken to the recovery area in stable condition.  Contrast used: 110 mL Isovue Fluoroscopy time: 14.1 min Radiation dose: 870 mGy   Estimated blood loss <50 mL.  During this procedure the patient was administered the following to achieve and maintain moderate conscious sedation: Versed 1 mg, Fentanyl 50 mcg, while the patient's heart rate, blood pressure, and oxygen saturation were continuously monitored. The period of conscious sedation was 47 minutes,  of which I was present face-to-face 100% of this time.  Complications   Complications documented before study signed (04/22/2018 7:58 AM EDT)    No complications were associated with this study.  Documented by Nelva Bush, MD - 04/21/2018 7:22 PM EDT    Coronary Findings   Diagnostic  Dominance: Left  Left Main  Vessel is large. Vessel is angiographically normal. Short LMCA.  Left Anterior Descending  Vessel is large.  First Diagonal Branch  Vessel is small in size.  Second Diagonal Branch  Vessel is moderate in size.  Left Circumflex  Vessel is large. Vessel is angiographically normal.  First Obtuse Marginal Branch  Vessel is small in size.  Second Obtuse Marginal Branch  Vessel is moderate in size.  Left Posterior Descending Artery  Vessel is moderate in size.  First Left Posterolateral Branch  Vessel is moderate in size.  Second Left Posterolateral Branch  Vessel is small in size.  Right Coronary Artery  Vessel is moderate in size. Vessel is angiographically normal.  Intervention   No interventions have been documented.  Right Heart   Right Heart Pressures RA (mean): 16 mmHg RV (S/EDP): 52/16 mmHg PA (S/D, mean): 52/33 (39) mmHg PCWP (mean): 25-30 mmHg with prominent V-waves up to 45 mmHg.  Ao sat: 91% PA sat:  56%  Fick CO: 4.3 L/min Fick CI: 1.8 L/min/m^2  Left Heart   Left Ventricle LV end diastolic pressure is mildly elevated. LVEDP 20-25 mmHg.  Aortic Valve There is no aortic valve stenosis.  Coronary Diagrams   Diagnostic Diagram           Impression:  Patient has stable symptoms of chronic combined systolic and diastolic congestive heart failure and long-standing persistent atrial fibrillation. I have personally reviewed the patient's recent transesophageal echocardiogram and diagnostic cardiac catheterization.  TEE reveals moderate to severe aortic insufficiency and severe mitral regurgitation with nonischemic cardiomyopathy and moderate left ventricular systolic dysfunction.  The aortic valve is trileaflet.  There is no significant aortic stenosis.  There is an eccentric jet of aortic insufficiency directed along the ventricular surface of the anterior leaflet of the mitral valve.  There appears to be mild prolapse involving the right coronary cusp.  There may also be some annular enlargement and mild fibrosis along the leaflet margins which contribute to the presence of aortic insufficiency.  The jet of mitral regurgitation is central and for the most part there appears to be normal leaflet mobility consistent with type I mitral valve dysfunction secondary to annular enlargement, left ventricular dysfunction, and volume overload.  There may be some restriction of the posterior leaflet, although for the most part the subvalvular apparatus in the posterior leaflet appears reasonably normal.  There is moderate to severe left atrial enlargement.  Diagnostic cardiac catheterization is notable for the absence of significant coronary artery disease.  The patient has moderate pulmonary hypertension and large V waves on pulmonary capillary wedge tracing consistent with severe mitral regurgitation.  The patient needs aortic valve repair or replacement and mitral valve repair.  He would likely benefit  from a concomitant Maze procedure.  Risks associated with surgery will be somewhat elevated because of the patient's left ventricular dysfunction and comorbid medical conditions including morbid obesity.   Plan:  The patient and his wife were counseled at length regarding the indications, risks and potential benefits of aortic valve repair or replacement and mitral valve repair.  The rationale for elective surgery has been explained, including a comparison between surgery and  continued medical therapy with close follow-up.  The likelihood of successful and durable valve repair has been discussed with particular reference to the findings of their recent echocardiogram.  Based upon these findings and previous experience, I have quoted them a 50% chance of successful aortic valve repair and greater than 95% of successful mitral valve repair.  In the event that either valve cannot be successfully repaired, we discussed the possibility of replacing the aortic and/or mitral valve using a mechanical prosthesis with the attendant need for long-term anticoagulation versus the alternative of replacing them using a bioprosthetic tissue valve with its potential for late structural valve deterioration and failure, depending upon the patient's longevity.  The patient specifically requests that if the either valve must be replaced that they be done using bioprosthetic tissue valves.   The relative risks and benefits of performing a maze procedure at the time of their surgery was discussed at length, including the expected likelihood of long term freedom from recurrent symptomatic atrial fibrillation and/or atrial flutter.  The patient additionally provides consent for long term follow up following surgery including participation in the [place].  Expectations for the patient's postoperative convalescence have been discussed.  The patient understands and accepts all potential risks of surgery including but not limited  to risk of death, stroke or other neurologic complication, myocardial infarction, congestive heart failure, respiratory failure, renal failure, bleeding requiring transfusion and/or reexploration, arrhythmia, infection or other wound complications, pneumonia, pleural and/or pericardial effusion, pulmonary embolus, aortic dissection or other major vascular complication, or delayed complications related to valve repair or replacement including but not limited to structural valve deterioration and failure, thrombosis, embolization, endocarditis, or paravalvular leak.  All of their questions have been answered.  We plan to proceed with surgery on May 18, 2018.  The patient has been instructed to stop taking Xarelto 7 days prior to surgery.  He will return to our office for follow-up prior to surgery on May 17, 2018.  During the interim period of time the patient will undergo chest x-ray to rule out the possibility of aspiration pneumonia because of the patient's cough which has persisted since his recent transesophageal echocardiogram.   I spent in excess of 30 minutes during the conduct of this office consultation and >50% of this time involved direct face-to-face encounter with the patient for counseling and/or coordination of their care.    Valentina Gu. Roxy Manns, MD 05/04/2018 3:39 PM   CHEST - 2 VIEW  COMPARISON:  CT 02/19/2018  FINDINGS: Cardiomegaly. Mild patchy density at the left base that could be atelectasis or mild pneumonia. No dense consolidation or lobar collapse. No effusions. No significant bone finding.  IMPRESSION: Cardiomegaly. Patchy density at the left base that could be atelectasis or mild pneumonia.   Electronically Signed   By: Nelson Chimes M.D.   On: 05/04/2018 16:59   I have personally reviewed the chest radiograph performed yesterday.  There is very slight opacity at the left lung base which could represent a mild case of aspiration pneumonia.  We will  notify the patient and prescribe a short course of antibiotics.  He will undergo repeat chest x-ray prior to surgery.  He will contact our office if his cough has not improved or resolved.  Rexene Alberts, MD 05/05/2018 10:09 AM

## 2018-05-05 ENCOUNTER — Other Ambulatory Visit: Payer: Self-pay | Admitting: *Deleted

## 2018-05-05 DIAGNOSIS — Z01818 Encounter for other preprocedural examination: Secondary | ICD-10-CM | POA: Diagnosis not present

## 2018-05-05 DIAGNOSIS — I4819 Other persistent atrial fibrillation: Secondary | ICD-10-CM

## 2018-05-05 DIAGNOSIS — I351 Nonrheumatic aortic (valve) insufficiency: Secondary | ICD-10-CM

## 2018-05-05 DIAGNOSIS — I5032 Chronic diastolic (congestive) heart failure: Secondary | ICD-10-CM | POA: Diagnosis not present

## 2018-05-05 DIAGNOSIS — I340 Nonrheumatic mitral (valve) insufficiency: Secondary | ICD-10-CM

## 2018-05-05 MED ORDER — AZITHROMYCIN 1 G PO PACK: 1.0000 g | PACK | Freq: Once | ORAL | 0 refills | Status: AC

## 2018-05-05 NOTE — Addendum Note (Signed)
Addended by: Rexene Alberts on: 05/05/2018 10:15 AM   Modules accepted: Orders

## 2018-05-06 ENCOUNTER — Telehealth: Payer: Self-pay

## 2018-05-06 ENCOUNTER — Encounter: Payer: Self-pay | Admitting: *Deleted

## 2018-05-06 LAB — BRAIN NATRIURETIC PEPTIDE: Brain Natriuretic Peptide: 115 pg/mL — ABNORMAL HIGH (ref <100)

## 2018-05-06 MED ORDER — AZITHROMYCIN 250 MG PO TABS: 250.0000 mg | ORAL_TABLET | Freq: Every day | ORAL | 0 refills | Status: DC

## 2018-05-06 NOTE — Telephone Encounter (Signed)
-----   Message from Laury Deep, RN sent at 05/06/2018 12:21 PM EDT ----- Marykay Lex,   Can you call this patient in Z-pack course per Dr. Roxy Manns??  Thanks, Ryan ----- Message ----- From: Rexene Alberts, MD Sent: 05/06/2018  12:19 PM EDT To: Laury Deep, RN  Yes a full Z pack  ----- Message ----- From: Laury Deep, RN Sent: 05/06/2018  10:00 AM EDT To: Rexene Alberts, MD, Donnella Sham, RN  Dr. Roxy Manns,  Did you mean to call him in a Z-pack?  I called him today with surgery info and he said he picked up what you called him in with but it was a 1 time dose that was a powder form.  I am thinking you meant to call in the normal 5 day Z-pack, right?  Thanks, [employer]

## 2018-05-06 NOTE — Telephone Encounter (Signed)
Patient called regarding medication and verified pharmacy.  Per Dr. Roxy Manns, patient is to start his Z-pack tomorrow.  Patient is aware and acknowledged receipt.

## 2018-05-10 DIAGNOSIS — I4811 Longstanding persistent atrial fibrillation: Secondary | ICD-10-CM | POA: Diagnosis not present

## 2018-05-10 DIAGNOSIS — R05 Cough: Secondary | ICD-10-CM | POA: Diagnosis not present

## 2018-05-10 DIAGNOSIS — I5023 Acute on chronic systolic (congestive) heart failure: Secondary | ICD-10-CM | POA: Diagnosis not present

## 2018-05-11 ENCOUNTER — Ambulatory Visit: Payer: Medicare HMO | Admitting: Cardiovascular Disease

## 2018-05-11 ENCOUNTER — Encounter: Payer: Self-pay | Admitting: Cardiovascular Disease

## 2018-05-11 VITALS — BP 132/76 | HR 92 | Ht 70.0 in | Wt 274.8 lb

## 2018-05-11 DIAGNOSIS — Z9889 Other specified postprocedural states: Secondary | ICD-10-CM | POA: Diagnosis not present

## 2018-05-11 DIAGNOSIS — I4819 Other persistent atrial fibrillation: Secondary | ICD-10-CM

## 2018-05-11 DIAGNOSIS — G4733 Obstructive sleep apnea (adult) (pediatric): Secondary | ICD-10-CM | POA: Diagnosis not present

## 2018-05-11 DIAGNOSIS — I110 Hypertensive heart disease with heart failure: Secondary | ICD-10-CM

## 2018-05-11 NOTE — Assessment & Plan Note (Signed)
History of chronic diastolic heart failure on furosemide with an EF in the 40% range.

## 2018-05-11 NOTE — Assessment & Plan Note (Signed)
History of moderate to severe MR scheduled for a mitral annular ring and valve repair by Dr. Roxy Manns 05/18/2018

## 2018-05-11 NOTE — Assessment & Plan Note (Signed)
History of persistent A. fib scheduled for surgical maze procedure 05/18/2018 by Dr. Roxy Manns.  He has stopped his Xarelto at Dr. Guy Sandifer request.

## 2018-05-11 NOTE — Progress Notes (Signed)
05/11/2018 [name]   1951-06-20  [id number]  Primary Physician Seward Carol, MD Primary Cardiologist: Lorretta Harp MD Lupe Carney, [place]  HPI:  [name] is a 67 y.o.  severely overweight married Caucasian male father of 66, grandfather to 3 grandchildren works as a Arts development officer. He was referred by Dr. Maudry Mayhew for evaluation of new-onset atrial fibrillation. I last saw him in the office 06/30/2017. His factors include hypertension. He has obstructive sleep apnea on CPAP. He has had 3 right hip replacements in the past. He's never had a heart attack or stroke he denies chest pain or shortness of breath. Dr. Delfina Redwood began him on low-dose beta blockade. I began him on Xarelto oral anticoagulation. He'll ultimately underwent outpatient direct current cardioversion by Dr. Sallyanne Kuster with 1 shock of 120 J successfully to sinus rhythm. He ultimately reverted back to atrial fibrillation. A 2-D echocardiogram revealed normal LV systolic function with moderate to severe left atrial lodgment. A Myoview stress test suggested inferior scar without ischemia. He has seen Dr. Rayann Heman over the last 5 years and has been on Tikosyn in the past without benefit.   He was admitted to the hospital for 1 day 04/21/2018 for right left heart cath and TEE.  This showed normal coronary arteries, elevated filling pressures and wedge pressure.  The TEE did show moderate to severe MR and AI with an EF of 35 to 40%.  He is scheduled for surgical maze procedure in addition to aortic bioprosthetic valve replacement and mitral valve repair and/or annular ring by Dr. Roxy Manns 05/18/2018.  Current Meds  Medication Sig  . furosemide (LASIX) 40 MG tablet Take 1 tablet (40 mg total) by mouth daily.  Marland Kitchen KLOR-CON 10 10 MEQ tablet Take 1 tablet (10 mEq total) by mouth daily. Take 1 extra tablet Mon, Wed, Fri (Patient taking differently: Take 10 mEq by mouth daily. )  . metoprolol succinate  (TOPROL-XL) 50 MG 24 hr tablet TAKE 1 TABLET (50 MG TOTAL) BY MOUTH DAILY. (Patient taking differently: Take 50 mg by mouth daily. Pt may take an additional dose if needed for tachycardia)  . Probiotic Product (PROBIOTIC PO) Take 1 capsule by mouth 3 (three) times a week.   . telmisartan (MICARDIS) 80 MG tablet Take 80 mg by mouth daily.  [name] 20 MG TABS tablet TAKE 1 TABLET (20 MG TOTAL) BY MOUTH DAILY. (Patient taking differently: Take 20 mg by mouth daily with supper. )     Allergies  Allergen Reactions  . Oxycodone Rash    Can tolerate Hydrocodone    Social History   Socioeconomic History  . Marital status: Married    Spouse name: Not on file  . Number of children: Not on file  . Years of education: Not on file  . Highest education level: Not on file  Occupational History  . Occupation: Agricultural consultant  Social Needs  . Financial resource strain: Not on file  . Food insecurity:    Worry: Not on file    Inability: Not on file  . Transportation needs:    Medical: Not on file    Non-medical: Not on file  Tobacco Use  . Smoking status: Never Smoker  . Smokeless tobacco: Never Used  Substance and Sexual Activity  . Alcohol use: No  . Drug use: No  . Sexual activity: Yes  Lifestyle  . Physical activity:    Days per week: Not on file  Minutes per session: Not on file  . Stress: Not on file  Relationships  . Social connections:    Talks on phone: Not on file    Gets together: Not on file    Attends religious service: Not on file    Active member of club or organization: Not on file    Attends meetings of clubs or organizations: Not on file    Relationship status: Not on file  . Intimate partner violence:    Fear of current or ex partner: Not on file    Emotionally abused: Not on file    Physically abused: Not on file    Forced sexual activity: Not on file  Other Topics Concern  . Not on file  Social History Narrative   Pt lives in  [place] with spouse.  Leadership training and behavioral safety training.     Review of Systems: General: negative for chills, fever, night sweats or weight changes.  Cardiovascular: negative for chest pain, dyspnea on exertion, edema, orthopnea, palpitations, paroxysmal nocturnal dyspnea or shortness of breath Dermatological: negative for rash Respiratory: negative for cough or wheezing Urologic: negative for hematuria Abdominal: negative for nausea, vomiting, diarrhea, bright red blood per rectum, melena, or hematemesis Neurologic: negative for visual changes, syncope, or dizziness All other systems reviewed and are otherwise negative except as noted above.    Blood pressure 132/76, pulse 92, height 5\' 10"  (1.778 m), weight 274 lb 12.8 oz (124.6 kg).  General appearance: alert and no distress Neck: no adenopathy, no carotid bruit, no JVD, supple, symmetrical, trachea midline and thyroid not enlarged, symmetric, no tenderness/mass/nodules Lungs: clear to auscultation bilaterally Heart: irregularly irregular rhythm Extremities: extremities normal, atraumatic, no cyanosis or edema Pulses: 2+ and symmetric Skin: Skin color, texture, turgor normal. No rashes or lesions Neurologic: Alert and oriented X 3, normal strength and tone. Normal symmetric reflexes. Normal coordination and gait  EKG not performed today  ASSESSMENT AND PLAN:   Obstructive sleep apnea History of obstructive sleep apnea on CPAP  Atrial fibrillation, persistent History of persistent A. fib scheduled for surgical maze procedure 05/18/2018 by Dr. Roxy Manns.  He has stopped his Xarelto at Dr. Guy Sandifer request.  Hypertensive cardiovascular disease 3 of essential hypertension her blood pressure measured today 132/76.  He is on metoprolol and Micardis.  Chronic diastolic congestive heart failure (HCC) History of chronic diastolic heart failure on furosemide with an EF in the 40% range.  Mitral regurgitation History of  moderate to severe MR scheduled for a mitral annular ring and valve repair by Dr. Roxy Manns 05/18/2018  Aortic insufficiency She of moderate to severe AI by TEE scheduled for aortic valve replacement with a bioprosthesis 05/18/2018 by Dr. Roxy Manns.      Lorretta Harp MD FACP,FACC,FAHA, [facility] 05/11/2018 11:03 AM

## 2018-05-11 NOTE — Assessment & Plan Note (Signed)
She of moderate to severe AI by TEE scheduled for aortic valve replacement with a bioprosthesis 05/18/2018 by Dr. Roxy Manns.

## 2018-05-11 NOTE — Assessment & Plan Note (Signed)
History of obstructive sleep apnea on CPAP. 

## 2018-05-11 NOTE — Assessment & Plan Note (Signed)
3 of essential hypertension her blood pressure measured today 132/76.  He is on metoprolol and Micardis.

## 2018-05-11 NOTE — Patient Instructions (Signed)
Medication Instructions:  Your physician recommends that you continue on your current medications as directed. Please refer to the Current Medication list given to you today.  If you need a refill on your cardiac medications before your next appointment, please call your pharmacy.   Lab work: none If you have labs (blood work) drawn today and your tests are completely normal, you will receive your results only by: Marland Kitchen MyChart Message (if you have MyChart) OR . A paper copy in the mail If you have any lab test that is abnormal or we need to change your treatment, we will call you to review the results.  Testing/Procedures: Your physician has requested that you have an echocardiogram. Echocardiography is a painless test that uses sound waves to create images of your heart. It provides your doctor with information about the size and shape of your heart and how well your heart's chambers and valves are working. This procedure takes approximately one hour. There are no restrictions for this procedure.  SCHEDULE IN 6 MONTHS   Follow-Up: At [facility], you and your health needs are our priority.  As part of our continuing mission to provide you with exceptional heart care, we have created designated Provider Care Teams.  These Care Teams include your primary Cardiologist (physician) and Advanced Practice Providers (APPs -  Physician Assistants and Nurse Practitioners) who all work together to provide you with the care you need, when you need it. You will need a follow up appointment in 6 months.  Please call our office 2 months in advance to schedule this appointment.  You may see Quay Burow, MD or one of the following Advanced Practice Providers on your designated Care Team:   Kerin Ransom, PA-C Roby Lofts, [place] . Sande Rives, PA-C  Any Other Special Instructions Will Be Listed Below (If Applicable).

## 2018-05-13 ENCOUNTER — Encounter (HOSPITAL_COMMUNITY): Payer: Self-pay

## 2018-05-13 NOTE — Pre-Procedure Instructions (Signed)
IDO WOLLMAN  05/13/2018      [id number] - [place], [place] - [address] [place] [address] Phone: [phone] Fax: [phone]    Your procedure is scheduled on November 5th.  Report to [facility] Admitting at [place].M.  Call this number if you have problems the morning of surgery:  [phone]   Remember:  Do not eat or drink after midnight.    Take these medicines the morning of surgery with A SIP OF WATER   metoprolol succinate (TOPROL-XL)   Follow your Surgeon's instructions for when to stop/resume your Xarelto.  7 days prior to surgery STOP taking any Aspirin(unless otherwise instructed by your surgeon), Aleve, Naproxen, Ibuprofen, Motrin, Advil, Goody's, BC's, all herbal medications, fish oil, and all vitamins     Do not wear jewelry.  Do not wear lotions, powders, or colognes, or deodorant.  Men may shave face and neck.  Do not bring valuables to the hospital.  [facility] is not responsible for any belongings or valuables.  Contacts, dentures or bridgework may not be worn into surgery.  Leave your suitcase in the car.  After surgery it may be brought to your room.  For patients admitted to the hospital, discharge time will be determined by your treatment team.  Patients discharged the day of surgery will not be allowed to drive home.    [place]- Preparing For Surgery  Before surgery, you can play an important role. Because skin is not sterile, your skin needs to be as free of germs as possible. You can reduce the number of germs on your skin by washing with CHG (chlorahexidine gluconate) Soap before surgery.  CHG is an antiseptic cleaner which kills germs and bonds with the skin to continue killing germs even after washing.    Oral Hygiene is also important to reduce your risk of infection.  Remember - BRUSH YOUR TEETH THE MORNING OF SURGERY WITH YOUR REGULAR  TOOTHPASTE  Please do not use if you have an allergy to CHG or antibacterial soaps. If your skin becomes reddened/irritated stop using the CHG.  Do not shave (including legs and underarms) for at least 48 hours prior to first CHG shower. It is OK to shave your face.  Please follow these instructions carefully.   1. Shower the NIGHT BEFORE SURGERY and the MORNING OF SURGERY with CHG.   2. If you chose to wash your hair, wash your hair first as usual with your normal shampoo.  3. After you shampoo, rinse your hair and body thoroughly to remove the shampoo.  4. Use CHG as you would any other liquid soap. You can apply CHG directly to the skin and wash gently with a scrungie or a clean washcloth.   5. Apply the CHG Soap to your body ONLY FROM THE NECK DOWN.  Do not use on open wounds or open sores. Avoid contact with your eyes, ears, mouth and genitals (private parts). Wash Face and genitals (private parts)  with your normal soap.  6. Wash thoroughly, paying special attention to the area where your surgery will be performed.  7. Thoroughly rinse your body with warm water from the neck down.  8. DO NOT shower/wash with your normal soap after using and rinsing off the CHG Soap.  9. Pat yourself dry with a CLEAN TOWEL.  10. Wear CLEAN PAJAMAS to bed the night before surgery,  wear comfortable clothes the morning of surgery  11. Place CLEAN SHEETS on your bed the night of your first shower and DO NOT SLEEP WITH PETS.    Day of Surgery:  Do not apply any deodorants/lotions.  Please wear clean clothes to the hospital/surgery center.   Remember to brush your teeth WITH YOUR REGULAR TOOTHPASTE.    Please read over the following fact sheets that you were given.

## 2018-05-14 ENCOUNTER — Ambulatory Visit (HOSPITAL_COMMUNITY)
Admission: RE | Admit: 2018-05-14 | Discharge: 2018-05-14 | Disposition: A | Discharge status: Home / Self Care | Payer: Medicare HMO | Source: Ambulatory Visit | Attending: Thoracic Surgery (Cardiothoracic Vascular Surgery) | Admitting: Thoracic Surgery (Cardiothoracic Vascular Surgery)

## 2018-05-14 ENCOUNTER — Encounter (HOSPITAL_COMMUNITY)
Admission: RE | Admit: 2018-05-14 | Discharge: 2018-05-14 | Disposition: A | Discharge status: Home / Self Care | Payer: Medicare HMO | Source: Ambulatory Visit | Attending: Thoracic Surgery (Cardiothoracic Vascular Surgery) | Admitting: Thoracic Surgery (Cardiothoracic Vascular Surgery)

## 2018-05-14 ENCOUNTER — Other Ambulatory Visit: Payer: Self-pay

## 2018-05-14 ENCOUNTER — Encounter (HOSPITAL_COMMUNITY): Payer: Self-pay

## 2018-05-14 DIAGNOSIS — I4819 Other persistent atrial fibrillation: Secondary | ICD-10-CM | POA: Insufficient documentation

## 2018-05-14 DIAGNOSIS — I351 Nonrheumatic aortic (valve) insufficiency: Secondary | ICD-10-CM | POA: Insufficient documentation

## 2018-05-14 DIAGNOSIS — I340 Nonrheumatic mitral (valve) insufficiency: Secondary | ICD-10-CM

## 2018-05-14 DIAGNOSIS — I6523 Occlusion and stenosis of bilateral carotid arteries: Secondary | ICD-10-CM | POA: Diagnosis not present

## 2018-05-14 DIAGNOSIS — R05 Cough: Secondary | ICD-10-CM | POA: Diagnosis not present

## 2018-05-14 HISTORY — DX: Dyspnea, unspecified: R06.00

## 2018-05-14 LAB — BLOOD GAS, ARTERIAL
Acid-base deficit: 2.2 mmol/L — ABNORMAL HIGH (ref 0.0–2.0)
BICARBONATE: 21.5 mmol/L (ref 20.0–28.0)
Drawn by: 470591
FIO2: 0.21
O2 SAT: 98.4 %
PATIENT TEMPERATURE: 98.6
PO2 ART: 113 mmHg — AB (ref 83.0–108.0)
pCO2 arterial: 32.9 mmHg (ref 32.0–48.0)
pH, Arterial: 7.431 (ref 7.350–7.450)

## 2018-05-14 LAB — COMPREHENSIVE METABOLIC PANEL
ALK PHOS: 40 U/L (ref 38–126)
ALT: 48 U/L — ABNORMAL HIGH (ref 0–44)
AST: 50 U/L — AB (ref 15–41)
Albumin: 2.9 g/dL — ABNORMAL LOW (ref 3.5–5.0)
Anion gap: 10 (ref 5–15)
BILIRUBIN TOTAL: 0.7 mg/dL (ref 0.3–1.2)
BUN: 25 mg/dL — AB (ref 8–23)
CALCIUM: 8.8 mg/dL — AB (ref 8.9–10.3)
CO2: 19 mmol/L — ABNORMAL LOW (ref 22–32)
CREATININE: 1.18 mg/dL (ref 0.61–1.24)
Chloride: 108 mmol/L (ref 98–111)
GFR calc Af Amer: >60 mL/min (ref >60)
Glucose, Bld: 114 mg/dL — ABNORMAL HIGH (ref 70–99)
Potassium: 4.5 mmol/L (ref 3.5–5.1)
Sodium: 137 mmol/L (ref 135–145)
Total Protein: 6.1 g/dL — ABNORMAL LOW (ref 6.5–8.1)

## 2018-05-14 LAB — CBC
HCT: 43.5 % (ref 39.0–52.0)
Hemoglobin: 13.5 g/dL (ref 13.0–17.0)
MCH: 26.6 pg (ref 26.0–34.0)
MCHC: 31 g/dL (ref 30.0–36.0)
MCV: 85.6 fL (ref 80.0–100.0)
Platelets: 147 10*3/uL — ABNORMAL LOW (ref 150–400)
RBC: 5.08 MIL/uL (ref 4.22–5.81)
RDW: 15.4 % (ref 11.5–15.5)
WBC: 12.2 10*3/uL — ABNORMAL HIGH (ref 4.0–10.5)
nRBC: 0 % (ref 0.0–0.2)

## 2018-05-14 LAB — TYPE AND SCREEN
ABO/RH(D): A POS
Antibody Screen: NEGATIVE

## 2018-05-14 LAB — URINALYSIS, ROUTINE W REFLEX MICROSCOPIC
Bilirubin Urine: NEGATIVE
Glucose, UA: NEGATIVE mg/dL
HGB URINE DIPSTICK: NEGATIVE
Ketones, ur: NEGATIVE mg/dL
Leukocytes, UA: NEGATIVE
NITRITE: NEGATIVE
PROTEIN: NEGATIVE mg/dL
Specific Gravity, Urine: 1.018 (ref 1.005–1.030)
pH: 5 (ref 5.0–8.0)

## 2018-05-14 LAB — PROTIME-INR
INR: 1.21
Prothrombin Time: 15.2 seconds (ref 11.4–15.2)

## 2018-05-14 LAB — ABO/RH: ABO/RH(D): A POS

## 2018-05-14 LAB — APTT: APTT: 30 s (ref 24–36)

## 2018-05-14 LAB — SURGICAL PCR SCREEN
MRSA, PCR: NEGATIVE
STAPHYLOCOCCUS AUREUS: NEGATIVE

## 2018-05-14 LAB — HEMOGLOBIN A1C
HEMOGLOBIN A1C: 5.6 % (ref 4.8–5.6)
Mean Plasma Glucose: 114.02 mg/dL

## 2018-05-14 NOTE — Pre-Procedure Instructions (Signed)
NAOL ONTIVEROS  05/14/2018      [id number] - [place], [place] - [address] [place] [address] Phone: [phone] Fax: [phone]    Your procedure is scheduled on November 5th.  Report to [facility] Admitting at [place].M.  Call this number if you have problems the morning of surgery:  [phone]   Remember:  Do not eat or drink after midnight.    Take these medicines the morning of surgery with A SIP OF WATER   metoprolol succinate (TOPROL-XL)   Follow your Surgeon's instructions for when to stop/resume your Xarelto.  7 days prior to surgery STOP taking any Aspirin(unless otherwise instructed by your surgeon), Aleve, Naproxen, Ibuprofen, Motrin, Advil, Goody's, BC's, all herbal medications, fish oil, and all vitamins     Do not wear jewelry.  Do not wear lotions, powders, or colognes, or deodorant.  Men may shave face and neck.  Do not bring valuables to the hospital.  [facility] is not responsible for any belongings or valuables.  Contacts, dentures or bridgework may not be worn into surgery.  Leave your suitcase in the car.  After surgery it may be brought to your room.  For patients admitted to the hospital, discharge time will be determined by your treatment team.  Patients discharged the day of surgery will not be allowed to drive home.    [place]- Preparing For Surgery  Before surgery, you can play an important role. Because skin is not sterile, your skin needs to be as free of germs as possible. You can reduce the number of germs on your skin by washing with CHG (chlorahexidine gluconate) Soap before surgery.  CHG is an antiseptic cleaner which kills germs and bonds with the skin to continue killing germs even after washing.    Oral Hygiene is also important to reduce your risk of infection.  Remember - BRUSH YOUR TEETH THE MORNING OF SURGERY WITH YOUR REGULAR  TOOTHPASTE  Please do not use if you have an allergy to CHG or antibacterial soaps. If your skin becomes reddened/irritated stop using the CHG.  Do not shave (including legs and underarms) for at least 48 hours prior to first CHG shower. It is OK to shave your face.  Please follow these instructions carefully.   1. Shower the NIGHT BEFORE SURGERY and the MORNING OF SURGERY with CHG.   2. If you chose to wash your hair, wash your hair first as usual with your normal shampoo.  3. After you shampoo, rinse your hair and body thoroughly to remove the shampoo.  4. Use CHG as you would any other liquid soap. You can apply CHG directly to the skin and wash gently with a scrungie or a clean washcloth.   5. Apply the CHG Soap to your body ONLY FROM THE NECK DOWN.  Do not use on open wounds or open sores. Avoid contact with your eyes, ears, mouth and genitals (private parts). Wash Face and genitals (private parts)  with your normal soap.  6. Wash thoroughly, paying special attention to the area where your surgery will be performed.  7. Thoroughly rinse your body with warm water from the neck down.  8. DO NOT shower/wash with your normal soap after using and rinsing off the CHG Soap.  9. Pat yourself dry with a CLEAN TOWEL.  10. Wear CLEAN PAJAMAS to bed the night before surgery,  wear comfortable clothes the morning of surgery  11. Place CLEAN SHEETS on your bed the night of your first shower and DO NOT SLEEP WITH PETS.    Day of Surgery:  Do not apply any deodorants/lotions.  Please wear clean clothes to the hospital/surgery center.   Remember to brush your teeth WITH YOUR REGULAR TOOTHPASTE.    Please read over the following fact sheets that you were given.Incentive Spirometry,MSRA, Surgical site infection,pain pamplet

## 2018-05-14 NOTE — Progress Notes (Addendum)
PCP  Maudry Mayhew MD  Cardiologist Dr. Quay Burow  ECHO 04-15-2018  Stress 11-07-2016  PFT's done on 02-19-2018 called Ryan @ Dr. Guy Sandifer office , do not need to repeat today.  IB message sent to Dr. Roxy Manns for elevated WBC count.

## 2018-05-14 NOTE — Progress Notes (Signed)
Pre-AVR testing has been completed. 1-39% ICA stenosis bilaterally.  Palmar waveforms Right Palmar waveforms are obliterated with radial and ulnar compression. Left Palmar waveforms are obliterated with radial and ulnar compression.  05/14/18 1:28 PM [name] RVT

## 2018-05-17 ENCOUNTER — Other Ambulatory Visit: Payer: Self-pay

## 2018-05-17 ENCOUNTER — Encounter: Payer: Self-pay | Admitting: Thoracic Surgery (Cardiothoracic Vascular Surgery)

## 2018-05-17 ENCOUNTER — Ambulatory Visit: Payer: Medicare HMO | Admitting: Thoracic Surgery (Cardiothoracic Vascular Surgery)

## 2018-05-17 VITALS — BP 142/78 | HR 71 | Resp 18 | Ht 70.0 in | Wt 277.0 lb

## 2018-05-17 DIAGNOSIS — I4819 Other persistent atrial fibrillation: Secondary | ICD-10-CM

## 2018-05-17 DIAGNOSIS — I340 Nonrheumatic mitral (valve) insufficiency: Secondary | ICD-10-CM

## 2018-05-17 DIAGNOSIS — I351 Nonrheumatic aortic (valve) insufficiency: Secondary | ICD-10-CM | POA: Diagnosis not present

## 2018-05-17 MED ORDER — DOPAMINE-DEXTROSE 3.2-5 MG/ML-% IV SOLN
0.0000 ug/kg/min | INTRAVENOUS | Status: DC
  Filled 2018-05-17: qty 250

## 2018-05-17 MED ORDER — KENNESTONE BLOOD CARDIOPLEGIA (KBC) MANNITOL SYRINGE (20%, 32ML)
32.0000 mL | Freq: Once | INTRAVENOUS | Status: DC
  Filled 2018-05-17: qty 32

## 2018-05-17 MED ORDER — MAGNESIUM SULFATE 50 % IJ SOLN
40.0000 meq | INTRAMUSCULAR | Status: DC
  Filled 2018-05-17: qty 9.85

## 2018-05-17 MED ORDER — DEXMEDETOMIDINE HCL IN NACL 400 MCG/100ML IV SOLN
0.1000 ug/kg/h | INTRAVENOUS | Status: DC
  Filled 2018-05-17: qty 100

## 2018-05-17 MED ORDER — VANCOMYCIN HCL 10 G IV SOLR
1500.0000 mg | INTRAVENOUS | Status: AC
  Administered 2018-05-18: 1500 mg via INTRAVENOUS
  Filled 2018-05-17: qty 1500

## 2018-05-17 MED ORDER — VANCOMYCIN HCL 1000 MG IV SOLR
INTRAVENOUS | Status: AC
  Administered 2018-05-18: 1000 mL
  Filled 2018-05-17: qty 1000

## 2018-05-17 MED ORDER — NOREPINEPHRINE 4 MG/250ML-% IV SOLN
0.0000 ug/min | INTRAVENOUS | Status: DC
  Filled 2018-05-17: qty 250

## 2018-05-17 MED ORDER — EPINEPHRINE PF 1 MG/ML IJ SOLN
0.0000 ug/min | INTRAVENOUS | Status: DC
  Filled 2018-05-17: qty 4

## 2018-05-17 MED ORDER — TRANEXAMIC ACID (OHS) PUMP PRIME SOLUTION
2.0000 mg/kg | INTRAVENOUS | Status: DC
  Filled 2018-05-17: qty 2.5

## 2018-05-17 MED ORDER — KENNESTONE BLOOD CARDIOPLEGIA VIAL
13.0000 mL | Freq: Once | Status: DC
  Filled 2018-05-17: qty 13

## 2018-05-17 MED ORDER — GLUTARALDEHYDE 0.625% SOAKING SOLUTION
Freq: Once | TOPICAL | Status: DC
  Filled 2018-05-17: qty 50

## 2018-05-17 MED ORDER — PLASMA-LYTE 148 IV SOLN
INTRAVENOUS | Status: AC
  Administered 2018-05-18: 500 mL
  Filled 2018-05-17: qty 2.5

## 2018-05-17 MED ORDER — SODIUM CHLORIDE 0.9 % IV SOLN
INTRAVENOUS | Status: DC
  Filled 2018-05-17: qty 30

## 2018-05-17 MED ORDER — MILRINONE LACTATE IN DEXTROSE 20-5 MG/100ML-% IV SOLN
0.3000 ug/kg/min | INTRAVENOUS | Status: DC
  Filled 2018-05-17: qty 100

## 2018-05-17 MED ORDER — PHENYLEPHRINE HCL-NACL 20-0.9 MG/250ML-% IV SOLN
30.0000 ug/min | INTRAVENOUS | Status: DC
  Filled 2018-05-17: qty 250

## 2018-05-17 MED ORDER — TRANEXAMIC ACID (OHS) BOLUS VIA INFUSION
15.0000 mg/kg | INTRAVENOUS | Status: AC
  Administered 2018-05-18: 1876.5 mg via INTRAVENOUS
  Filled 2018-05-17: qty 1877

## 2018-05-17 MED ORDER — POTASSIUM CHLORIDE 2 MEQ/ML IV SOLN
80.0000 meq | INTRAVENOUS | Status: DC
  Filled 2018-05-17: qty 40

## 2018-05-17 MED ORDER — SODIUM CHLORIDE 0.9 % IV SOLN
1.5000 g | INTRAVENOUS | Status: AC
  Administered 2018-05-18: 1.5 g via INTRAVENOUS
  Filled 2018-05-17: qty 1.5

## 2018-05-17 MED ORDER — TRANEXAMIC ACID 1000 MG/10ML IV SOLN
1.5000 mg/kg/h | INTRAVENOUS | Status: DC
  Filled 2018-05-17 (×2): qty 25

## 2018-05-17 MED ORDER — SODIUM CHLORIDE 0.9 % IV SOLN
750.0000 mg | INTRAVENOUS | Status: DC
  Filled 2018-05-17: qty 750

## 2018-05-17 MED ORDER — INSULIN REGULAR(HUMAN) IN NACL 100-0.9 UT/100ML-% IV SOLN
INTRAVENOUS | Status: DC
  Filled 2018-05-17: qty 100

## 2018-05-17 MED ORDER — NITROGLYCERIN IN D5W 200-5 MCG/ML-% IV SOLN
2.0000 ug/min | INTRAVENOUS | Status: AC
  Administered 2018-05-18: 5 ug/min via INTRAVENOUS
  Filled 2018-05-17: qty 250

## 2018-05-17 NOTE — Progress Notes (Signed)
[address]       [place] [address]             [phone]     CARDIOTHORACIC SURGERY OFFICE NOTE  Referring Provider is Thompson Grayer, MD PCP is Seward Carol, MD   HPI:  Patient returns the office today with tentative plans to proceed with elective aortic valve repair or replacement, mitral valve repair, and Maze procedure in the operating room tomorrow.  He was last seen here in our office on May 04, 2018 at which time we made tentative plans for surgery.  At that time the patient was also recovering from an upper respiratory tract infection, and chest radiograph revealed a faint opacity at the left lung base suggestive of possible pneumonia.  He completed a course of oral antibiotics and he states that his cough has essentially completely resolved.  He states that he feels as good as he has felt in several months and he is looking forward to proceeding with surgery.  The remainder of his review of systems is unchanged from previously.   Current Outpatient Medications  Medication Sig Dispense Refill  . chlorpheniramine-HYDROcodone (TUSSIONEX PENNKINETIC ER) 10-8 MG/5ML SUER Take 5 mLs by mouth every 12 (twelve) hours as needed for cough. 140 mL 0  . furosemide (LASIX) 40 MG tablet Take 1 tablet (40 mg total) by mouth daily.    Marland Kitchen KLOR-CON 10 10 MEQ tablet Take 1 tablet (10 mEq total) by mouth daily. Take 1 extra tablet Mon, Wed, Fri (Patient taking differently: Take 10 mEq by mouth daily. ) 48 tablet 6  . metoprolol succinate (TOPROL-XL) 50 MG 24 hr tablet TAKE 1 TABLET (50 MG TOTAL) BY MOUTH DAILY. (Patient taking differently: Take 50 mg by mouth daily. Pt may take an additional dose if needed for tachycardia) 30 tablet 1  . Probiotic Product (PROBIOTIC PO) Take 1 capsule by mouth 3 (three) times a week.     . telmisartan (MICARDIS) 80 MG tablet Take 80 mg by mouth daily.    [name] 20 MG TABS tablet TAKE 1 TABLET (20 MG TOTAL) BY MOUTH DAILY. (Patient  taking differently: Take 20 mg by mouth daily with supper. ) 30 tablet 6   No current facility-administered medications for this visit.    Facility-Administered Medications Ordered in Other Visits  Medication Dose Route Frequency Provider Last Rate Last Dose  . [START ON 05/18/2018] cefUROXime (ZINACEF) 1.5 g in sodium chloride 0.9 % 100 mL IVPB  1.5 g Intravenous To OR Rexene Alberts, MD      . Derrill Memo ON 05/18/2018] cefUROXime (ZINACEF) 750 mg in sodium chloride 0.9 % 100 mL IVPB  750 mg Intravenous To OR Rexene Alberts, MD      . Derrill Memo ON 05/18/2018] dexmedetomidine (PRECEDEX) 400 MCG/100ML (4 mcg/mL) infusion  0.1-0.7 mcg/kg/hr Intravenous To OR Rexene Alberts, MD      . Derrill Memo ON 05/18/2018] DOPamine (INTROPIN) 800 mg in dextrose 5 % 250 mL (3.2 mg/mL) infusion  0-10 mcg/kg/min Intravenous To OR Rexene Alberts, MD      . Derrill Memo ON 05/18/2018] EPINEPHrine (ADRENALIN) 4 mg in dextrose 5 % 250 mL (0.016 mg/mL) infusion  0-10 mcg/min Intravenous To OR Rexene Alberts, MD      . Derrill Memo ON 05/18/2018] glutaraldehyde 0.625% cardiac soaking solution   Topical Once Rexene Alberts, MD      . Derrill Memo ON 05/18/2018] heparin 2,500 Units, papaverine 30 mg in electrolyte-148 (  PLASMALYTE-148) 500 mL irrigation   Irrigation To OR Rexene Alberts, MD      . Derrill Memo ON 05/18/2018] heparin 30,000 units/NS 1000 mL solution for CELLSAVER   Other To OR Rexene Alberts, MD      . Derrill Memo ON 05/18/2018] insulin regular, human (MYXREDLIN) 100 units/ 100 mL infusion   Intravenous To OR Rexene Alberts, MD      . Derrill Memo ON 05/18/2018] [facility] Blood Cardioplegia (KBC) lidocaine 2% Syringe (36mL)  13 mL Intracoronary Once Rexene Alberts, MD      . Derrill Memo ON 05/18/2018] [facility] Blood Cardioplegia (KBC) lidocaine 2% Syringe (65mL)  13 mL Intracoronary Once Rexene Alberts, MD      . Derrill Memo ON 05/18/2018] [facility] Blood Cardioplegia (KBC) mannitol 20% Syringe (41mL)  32 mL Intracoronary Once Rexene Alberts, MD        . Derrill Memo ON 05/18/2018] [facility] Blood Cardioplegia (KBC) mannitol 20% Syringe (65mL)  32 mL Intracoronary Once Rexene Alberts, MD      . Derrill Memo ON 05/18/2018] magnesium sulfate (IV Push/IM) injection 40 mEq  40 mEq Other To OR Rexene Alberts, MD      . Derrill Memo ON 05/18/2018] milrinone (PRIMACOR) 20 MG/100 ML (0.2 mg/mL) infusion  0.3 mcg/kg/min Intravenous To OR Rexene Alberts, MD      . Derrill Memo ON 05/18/2018] nitroGLYCERIN 50 mg in dextrose 5 % 250 mL (0.2 mg/mL) infusion  2-200 mcg/min Intravenous To OR Rexene Alberts, MD      . Derrill Memo ON 05/18/2018] norepinephrine (LEVOPHED) 4mg  in D5W 278mL premix infusion  0-40 mcg/min Intravenous Titrated Rexene Alberts, MD      . Derrill Memo ON 05/18/2018] phenylephrine (NEOSYNEPHRINE) 20-0.9 MG/250ML-% infusion  30-200 mcg/min Intravenous To OR Rexene Alberts, MD      . Derrill Memo ON 05/18/2018] potassium chloride injection 80 mEq  80 mEq Other To OR Rexene Alberts, MD      . Derrill Memo ON 05/18/2018] tranexamic acid (CYKLOKAPRON) 2,500 mg in sodium chloride 0.9 % 250 mL (10 mg/mL) infusion  1.5 mg/kg/hr Intravenous To OR Rexene Alberts, MD      . Derrill Memo ON 05/18/2018] tranexamic acid (CYKLOKAPRON) bolus via infusion - over 30 minutes 1,876.5 mg  15 mg/kg Intravenous To OR Rexene Alberts, MD      . Derrill Memo ON 05/18/2018] tranexamic acid (CYKLOKAPRON) pump prime solution 250 mg  2 mg/kg Intracatheter To OR Rexene Alberts, MD      . Derrill Memo ON 05/18/2018] vancomycin (VANCOCIN) 1,000 mg in sodium chloride 0.9 % 1,000 mL irrigation   Irrigation To OR Rexene Alberts, MD      . Derrill Memo ON 05/18/2018] vancomycin (VANCOCIN) 1,500 mg in sodium chloride 0.9 % 250 mL IVPB  1,500 mg Intravenous To OR Rexene Alberts, MD          Physical Exam:   BP (!) 142/78 (BP Location: Right Arm, Patient Position: Sitting, Cuff Size: Normal)   Pulse 71   Resp 18   Ht 5\' 10"  (1.778 m)   Wt 277 lb (125.6 kg)   SpO2 96% Comment: RA  BMI 39.75 kg/m   General:  Obese but  well-appearing  Chest:   Clear to auscultation  CV:   Irregular rate and rhythm  Incisions:  n/a  Abdomen:  soft  Extremities:  warm  Diagnostic Tests:  CHEST - 2 VIEW  COMPARISON:  Chest x-rays dated 05/04/2018 and 10/01/2011.  FINDINGS: Heart size and mediastinal contours  are within normal limits. Lungs are clear. No pleural effusion or pneumothorax seen. No acute or suspicious osseous finding.  IMPRESSION: No active cardiopulmonary disease. No evidence of pneumonia or pulmonary edema.   Electronically Signed   By: Franki Cabot M.D.   On: 05/14/2018 15:27   Impression:  Patient has stable symptoms of chronic combined systolic and diastolic congestive heart failure and long-standing persistent atrial fibrillation. I have personally reviewed the patient's recent transesophageal echocardiogram and diagnostic cardiac catheterization.  TEE reveals moderate to severe aortic insufficiency and severe mitral regurgitation with nonischemic cardiomyopathy and moderate left ventricular systolic dysfunction.  The aortic valve is trileaflet.  There is no significant aortic stenosis.  There is an eccentric jet of aortic insufficiency directed along the ventricular surface of the anterior leaflet of the mitral valve.  There appears to be mild prolapse involving the right coronary cusp.  There may also be some annular enlargement and mild fibrosis along the leaflet margins which contribute to the presence of aortic insufficiency.  The jet of mitral regurgitation is central and for the most part there appears to be normal leaflet mobility consistent with type I mitral valve dysfunction secondary to annular enlargement, left ventricular dysfunction, and volume overload.  There may be some restriction of the posterior leaflet, although for the most part the subvalvular apparatus in the posterior leaflet appears reasonably normal.  There is moderate to severe left atrial enlargement.  Diagnostic  cardiac catheterization is notable for the absence of significant coronary artery disease.  The patient has moderate pulmonary hypertension and large V waves on pulmonary capillary wedge tracing consistent with severe mitral regurgitation.  The patient needs aortic valve repair or replacement and mitral valve repair.  He would likely benefit from a concomitant Maze procedure.  Risks associated with surgery will be somewhat elevated because of the patient's left ventricular dysfunction and comorbid medical conditions including morbid obesity.   Plan:  The patient and his wife were again counseled at length regarding the indications, risks and potential benefits of aortic valve repair or replacement and mitral valve repair.  The rationale for elective surgery has been explained, including a comparison between surgery and continued medical therapy with close follow-up.  The likelihood of successful and durable valve repair has been discussed with particular reference to the findings of their recent echocardiogram.  Based upon these findings and previous experience, I have quoted them a 50% chance of successful aortic valve repair and greater than 95% of successful mitral valve repair.  In the event that either valve cannot be successfully repaired, we discussed the possibility of replacing the aortic and/or mitral valve using a mechanical prosthesis with the attendant need for long-term anticoagulation versus the alternative of replacing them using a bioprosthetic tissue valve with its potential for late structural valve deterioration and failure, depending upon the patient's longevity.  The patient specifically requests that if the either valve must be replaced that they be done using bioprosthetic tissue valves.   The relative risks and benefits of performing a maze procedure at the time of their surgery was discussed at length, including the expected likelihood of long term freedom from recurrent symptomatic  atrial fibrillation and/or atrial flutter.  The patient additionally provides consent for long term follow up following surgery including participation in the [place].  Expectations for the patient's postoperative convalescence have been discussed.  The patient understands and accepts all potential risks of surgery including but not limited to risk of death, stroke or other  neurologic complication, myocardial infarction, congestive heart failure, respiratory failure, renal failure, bleeding requiring transfusion and/or reexploration, arrhythmia, infection or other wound complications, pneumonia, pleural and/or pericardial effusion, pulmonary embolus, aortic dissection or other major vascular complication, or delayed complications related to valve repair or replacement including but not limited to structural valve deterioration and failure, thrombosis, embolization, endocarditis, or paravalvular leak.  All of their questions have been answered.   I spent in excess of 15 minutes during the conduct of this office consultation and >50% of this time involved direct face-to-face encounter with the patient for counseling and/or coordination of their care.    Valentina Gu. Roxy Manns, MD 05/17/2018 2:38 PM

## 2018-05-17 NOTE — H&P (Signed)
[name]       [name]             [phone]          CARDIOTHORACIC SURGERY HISTORY AND PHYSICAL EXAM  Referring Provider is Thompson Grayer, MD  Primary Cardiologist is Lorretta Harp, MD PCP is Seward Carol, MD      Chief Complaint  Patient presents with  . Atrial Fibrillation    Surgical eval for MAZE procedure, ECHO 11/07/17    HPI:  Patient is a 67 year old obese male with history of chronic atrial fibrillation that is now persistent, hypertension, chronic diastolic congestive heart failure, and obstructive sleep apnea who has been referred for surgical consultation to discuss treatment options for management of persistent atrial fibrillation.  The patient states that he was first diagnosed with atrial fibrillation approximately 5 years ago.  He was initially evaluated by Dr. Alvester Chou and eventually referred to Dr. Rayann Heman for management.  He was managed with Tikosyn successfully for approximately 2 years, but he then began to develop recurrent episodes of paroxysmal atrial fibrillation that increased in frequency and duration.  He eventually developed persistent atrial fibrillation and underwent cardioversion on 2 occasions in 2014 and 2015.  Nuclear stress test performed April 2018 revealed moderate left ventricular chamber enlargement with no evidence for myocardial ischemia.  Echocardiogram performed in April 2018 revealed normal left ventricular systolic function with moderate left ventricular hypertrophy.  There was mild aortic insufficiency, mild mitral regurgitation, and severe left atrial enlargement reported.  He has remained in persistent atrial fibrillation since last fall without any spontaneous recurrence to sinus rhythm.  He was seen in follow-up recently by Dr. Rayann Heman and long-term treatment options were discussed.  Catheter based ablation was discussed as an alternative but felt likely to be associated with diminished  long-term success because of severe left atrial enlargement noted on echocardiogram in the setting of obesity with obstructive sleep apnea.  Cardiothoracic surgical consultation was requested to discuss surgical options including the Maze procedure.  The patient is married and lives locally in [place] with his wife.  He is originally from [place].  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  He is currently semiretired and spends most of his time locally at his home.  He enjoys working around the house out of doors.  He does not exercise on a regular basis.  He has been overweight all of his adult life.  He describes stable symptoms of exertional shortness of breath and fatigue did seem to wax and wane in severity.  He denies any history of resting shortness of breath, PND, orthopnea, or lower extremity edema.  He used to experience symptoms of palpitations when he would go in and out of sinus rhythm but he no longer experiences palpitations.  He denies any exertional chest pain or chest tightness.  He reports occasional transient episodes of tightness across his chest that do not seem to be related to activity.  He has been chronically anticoagulated using Xarelto for approximately 5 years without any history of bleeding complications, TIA, or stroke.  Patient is a 67 year old obese white male with history of long-standing persistent atrial fibrillation, hypertension, chronic diastolic congestive heart failure, and obstructive sleep apnea who returns to the office today to discuss surgical options for treatment of atrial fibrillation. He was originally seen in consultation  on February 02, 2018.  At that time the patient wanted to wait until the fall to proceed with further diagnostic work-up and possible surgery.  He was last seen in our office on April 12, 2018.  Since then he underwent  transesophageal echocardiogram and diagnostic cardiac catheterization to rule out the presence of significant structural heart disease and/or coronary artery disease.  TEE performed April 21, 2018 by Dr. Marlou Porch revealed moderate to severe aortic insufficiency and moderate to severe mitral regurgitation.  There was left atrium chamber enlargement.  There was no thrombus in the left atrial appendage.  LV systolic function was moderately reduced with ejection fraction estimated 35 to 40%.  There were no significant wall motion abnormalities.  Left and right heart catheterization performed by Dr. and revealed no significant coronary artery disease but confirmed the presence of moderate pulmonary hypertension and moderately elevated left heart filling pressures with large V waves on wedge tracing consistent with severe mitral regurgitation.    Patient returns the office today to discuss the results of these tests and consider treatment options further.  He complains that ever since his transesophageal echocardiogram he has had a persistent dry hacking nonproductive cough.  He states that the first couple of days the cough was quite severe and he could barely talk.  Symptoms have improved and he was given a prescription for Tessalon Perles, Tussionex, and a steroid Dosepak.  He seems to be improving.  He denies any fevers or chills.  He has not had a productive cough.  He has no difficulty swallowing.  His voice has returned to normal.  The remainder of his review of systems is unchanged from previously.  He continues to experience stable symptoms of exertional shortness of breath and fatigue consistent with chronic diastolic congestive heart failure.  He denies resting shortness of breath, PND, or orthopnea.  He reports occasional mild lower extremity edema.  He is not having chest pain or chest tightness.  He does not experience palpitations, dizzy spells, nor syncope.  Patient returns the office today with  tentative plans to proceed with elective aortic valve repair or replacement, mitral valve repair, and Maze procedure in the operating room tomorrow.  He was last seen here in our office on May 04, 2018 at which time we made tentative plans for surgery.  At that time the patient was also recovering from an upper respiratory tract infection, and chest radiograph revealed a faint opacity at the left lung base suggestive of possible pneumonia.  He completed a course of oral antibiotics and he states that his cough has essentially completely resolved.  He states that he feels as good as he has felt in several months and he is looking forward to proceeding with surgery.  The remainder of his review of systems is unchanged from previously.   Past Medical History:  Diagnosis Date  . Aortic insufficiency 04/21/2018  . Arthritis    "joints" (09/26/2013)  . Atrial enlargement, left   . Dyspnea    with exertion  . Hearing aid worn    both ears  . Hypertension   . Mitral regurgitation 04/21/2018  . Obesity   . OSA on CPAP    "mask adjusts to what I need" (09/26/2013)  . Persistent atrial fibrillation    cardioversion 06/06/13  . PONV (postoperative nausea and vomiting)   . Rotator cuff tear, right dec 2012   physical therapy done, decreased strength  . Wears glasses     Past Surgical History:  Procedure Laterality Date  . CARDIOVERSION N/A 06/06/2013   Procedure: CARDIOVERSION;  Surgeon: Sanda Klein, MD;  Location: [facility];  Service: Cardiovascular;  Laterality: N/A;  . CARDIOVERSION N/A 09/28/2013   Procedure: CARDIOVERSION BEDSIDE;  Surgeon: Peter M Martinique, MD;  Location: [facility];  Service: Cardiovascular;  Laterality: N/A;  . CARPAL TUNNEL RELEASE Right 08/08/2013   Procedure: RIGHT CARPAL TUNNEL RELEASE;  Surgeon: Wynonia Sours, MD;  Location: [facility];  Service: Orthopedics;  Laterality: Right;  . CARPAL TUNNEL RELEASE Left 2008  . KNEE ARTHROSCOPY Right 1990's  .  RIGHT/LEFT HEART CATH AND CORONARY ANGIOGRAPHY N/A 04/21/2018   Procedure: RIGHT/LEFT HEART CATH AND CORONARY ANGIOGRAPHY;  Surgeon: Nelva Bush, MD;  Location: [facility];  Service: Cardiovascular;  Laterality: N/A;  . SHOULDER OPEN ROTATOR CUFF REPAIR Left 1990's  . SHOULDER OPEN ROTATOR CUFF REPAIR Right 11/2007  . TEE WITHOUT CARDIOVERSION N/A 04/21/2018   Procedure: TRANSESOPHAGEAL ECHOCARDIOGRAM (TEE);  Surgeon: Jerline Pain, MD;  Location: [facility];  Service: Cardiovascular;  Laterality: N/A;  . TONSILLECTOMY  1950's  . TOTAL HIP ARTHROPLASTY Left 2010  . TOTAL HIP REVISION Left 10/08/2011  . TOTAL HIP REVISION  04/09/2012   Procedure: TOTAL HIP REVISION;  Surgeon: Gearlean Alf, MD;  Location: [facility];  Service: Orthopedics;  Laterality: Left;  Left Hip Acetabular Revision vs Constrained Liner  . TOTAL KNEE ARTHROPLASTY Right 2006   . TRIGGER FINGER RELEASE Right 08/08/2013   Procedure: RELEASE STENOSING TENOSYNOVITIS RIGHT THUMB;  Surgeon: Wynonia Sours, MD;  Location: [facility];  Service: Orthopedics;  Laterality: Right;    Family History  Problem Relation Age of Onset  . Other Unknown        mother had a pacemaker    Social History Social History   Tobacco Use  . Smoking status: Never Smoker  . Smokeless tobacco: Never Used  Substance Use Topics  . Alcohol use: No  . Drug use: No    Prior to Admission medications   Medication Sig Start Date End Date Taking? Authorizing Provider  furosemide (LASIX) 40 MG tablet Take 1 tablet (40 mg total) by mouth daily. 04/22/18  Yes Bhagat, Bhavinkumar, PA  KLOR-CON 10 10 MEQ tablet Take 1 tablet (10 mEq total) by mouth daily. Take 1 extra tablet Mon, Wed, Fri Patient taking differently: Take 10 mEq by mouth daily.  10/12/17  Yes Barrett, Evelene Croon, PA-C  metoprolol succinate (TOPROL-XL) 50 MG 24 hr tablet TAKE 1 TABLET (50 MG TOTAL) BY MOUTH DAILY. Patient taking differently: Take 50 mg by mouth daily. Pt  may take an additional dose if needed for tachycardia 12/06/14  Yes Allred, Jeneen Rinks, MD  Probiotic Product (PROBIOTIC PO) Take 1 capsule by mouth 3 (three) times a week.    Yes [provider]  telmisartan (MICARDIS) 80 MG tablet Take 80 mg by mouth daily. 05/10/18  Yes [provider]  XARELTO 20 MG TABS tablet TAKE 1 TABLET (20 MG TOTAL) BY MOUTH DAILY. Patient taking differently: Take 20 mg by mouth daily with supper.  07/19/14  Yes Allred, Jeneen Rinks, MD  chlorpheniramine-HYDROcodone [facility] PENNKINETIC ER) 10-8 MG/5ML SUER Take 5 mLs by mouth every 12 (twelve) hours as needed for cough. 04/29/18   Lendon Colonel, NP    Allergies  Allergen Reactions  . Oxycodone Rash and Other (See Comments)    Can tolerate Hydrocodone     Review of Systems:  General:                      normal appetite, decreased energy, no weight gain, no weight loss, no fever             Cardiac:                       no chest pain with exertion, occasional fleeting episodes of chest pain at rest, + SOB with exertion, no resting SOB, no PND, no orthopnea, no palpitations, + arrhythmia, + atrial fibrillation, no LE edema, no dizzy spells, no syncope             Respiratory:                 +  shortness of breath, no home oxygen, no productive cough, no dry cough, no bronchitis, no wheezing, no hemoptysis, no asthma, no pain with inspiration or cough, + sleep apnea, + CPAP at night             GI:                               no difficulty swallowing, no reflux, no frequent heartburn, no hiatal hernia, no abdominal pain, no constipation, no diarrhea, no hematochezia, no hematemesis, no melena             GU:                              no dysuria,  no frequency, no urinary tract infection, no hematuria, no enlarged prostate, no kidney stones, no kidney disease             Vascular:                     no pain suggestive of claudication, no pain in feet, no leg cramps, no varicose veins, no  DVT, no non-healing foot ulcer             Neuro:                         no stroke, no TIA's, no seizures, no headaches, no temporary blindness one eye,  no slurred speech, no peripheral neuropathy, no chronic pain, no instability of gait, no memory/cognitive dysfunction             Musculoskeletal:         + arthritis, no joint swelling, no myalgias, no difficulty walking, normal mobility              Skin:                            hno rash, no itching, no skin infections, no pressure sores or ulcerations             Psych:                         no anxiety, no depression, no nervousness, no unusual recent stress             Eyes:                           no blurry vision, no floaters,  no recent vision changes, + wears glasses or contacts             ENT:                            no hearing loss, no loose or painful teeth, no dentures, last saw dentist within the past 2 months             Hematologic:               no easy bruising, no abnormal bleeding, no clotting disorder, no frequent epistaxis             Endocrine:                   no diabetes, does not check CBG's at home                           Physical Exam:              BP (!) 160/90   Pulse 75   Resp 20   Ht 5' 10.5" (1.791 m)   Wt 276 lb (125.2 kg)   SpO2 96% Comment: RA  BMI 39.04 kg/m              General:                      Obese,  well-appearing             HEENT:                       Unremarkable              Neck:                           no JVD, no bruits, no adenopathy              Chest:                          clear to auscultation, symmetrical breath sounds, no wheezes, no rhonchi              CV:                              Irregular rate and rhythm, no murmur              Abdomen:                    soft, non-tender, no masses              Extremities:                 warm, well-perfused, pulses palpable, no LE edema             Rectal/GU                   Deferred             Neuro:                          Grossly non-focal and symmetrical throughout  Skin:                            Clean and dry, no rashes, no breakdown   Diagnostic Tests:  Transthoracic Echocardiography  Patient: [name], [name] MR #: [id number] Study Date: 11/07/2016 Gender: M Age: 59 Height: 177.8 cm Weight: 123.2 kg BSA: 2.52 m^2 Pt. Status: Room:  ATTENDING Kirk Ruths SONOGRAPHER Diamond Nickel ORDERING Thompson Grayer, MD REFERRING Thompson Grayer, MD PERFORMING [facility] Outpatient  cc:  ------------------------------------------------------------------- LV EF: 55% - 60%  ------------------------------------------------------------------- Indications: Atrial fibrillation - I48.91.  ------------------------------------------------------------------- History: PMH: Obesity. Obstructive sleep apnea (uses CPAP). Left atrial enlargement. Risk factors: Hypertension.  ------------------------------------------------------------------- Study Conclusions  - Left ventricle: The cavity size was normal. Wall thickness was increased in a pattern of moderate LVH. There was mild focal basal hypertrophy of the septum. Systolic function was normal. The estimated ejection fraction was in the range of 55% to 60%. Wall motion was normal; there were no regional wall motion abnormalities. - Aortic valve: There was mild regurgitation. - Aortic root: The aortic root was mildly dilated. - Mitral valve: There was mild regurgitation. - Left atrium: The atrium was severely dilated.  Impressions:  - Definity used; normal LV systolic function; moderated LVH; mild AI; mild MR; severe LAE.  ------------------------------------------------------------------- Study data: Comparison was made to the study of 12/17/2015. Study status: Routine. Procedure: The patient reported no pain pre or post test.  Transthoracic echocardiography. Image quality was adequate. The study was technically difficult, as a result of body habitus. Intravenous contrast (Definity) was administered. Study completion: There were no complications. Transthoracic echocardiography. M-mode, complete 2D, spectral Doppler, and color Doppler. Birthdate: Patient birthdate: 1950/07/24. Age: Patient is 67 yr old. Sex: Gender: male. BMI: 39 kg/m^2. Blood pressure: 140/82 Patient status: Outpatient. Study date: Study date: 11/07/2016. Study time: 07:31 AM. Location: [facility]  -------------------------------------------------------------------  ------------------------------------------------------------------- Left ventricle: The cavity size was normal. Wall thickness was increased in a pattern of moderate LVH. There was mild focal basal hypertrophy of the septum. Systolic function was normal. The estimated ejection fraction was in the range of 55% to 60%. Wall motion was normal; there were no regional wall motion abnormalities. The study was not technically sufficient to allow evaluation of LV diastolic dysfunction due to atrial fibrillation.  ------------------------------------------------------------------- Aortic valve: Trileaflet; mildly thickened leaflets. Mobility was not restricted. Doppler: Transvalvular velocity was within the normal range. There was no stenosis. There was mild regurgitation.  ------------------------------------------------------------------- Aorta: Aortic root: The aortic root was mildly dilated.  ------------------------------------------------------------------- Mitral valve: Mildly thickened leaflets . Mobility was not restricted. Doppler: Transvalvular velocity was within the normal range. There was no evidence for stenosis. There was mild regurgitation. Peak gradient (D): 4 mm  Hg.  ------------------------------------------------------------------- Left atrium: The atrium was severely dilated.  ------------------------------------------------------------------- Right ventricle: The cavity size was normal. Systolic function was normal.  ------------------------------------------------------------------- Pulmonic valve: Doppler: Transvalvular velocity was within the normal range. There was no evidence for stenosis.  ------------------------------------------------------------------- Tricuspid valve: Structurally normal valve. Doppler: Transvalvular velocity was within the normal range. There was trivial regurgitation.  ------------------------------------------------------------------- Right atrium: The atrium was normal in size.  ------------------------------------------------------------------- Pericardium: There was no pericardial effusion.  ------------------------------------------------------------------- Measurements  Left ventricle Value Reference LV ID, ED, PLAX chordal 51 mm 43 - 52 LV ID, ES, PLAX chordal (H) 42 mm 23 - 38 LV fx shortening, PLAX chordal (L) 18 % >=29 LV PW thickness, ED 14.95 mm ---------- IVS/LV  PW ratio, ED 1.28 <=1.3 LV e&', medial 8.89 cm/s ---------- LV E/e&', medial 11.92 ----------  Ventricular septum Value Reference IVS thickness, ED 19.08 mm ----------  LVOT Value Reference LVOT ID, S 23 mm ---------- LVOT area 4.15 cm^2 ----------  Aortic valve  Value Reference Aortic regurg peak velocity 275 cm/s ---------- Aortic regurg pressure half-time 441 ms ---------- Aortic regurg peak gradient 30 mm Hg ----------  Aorta Value Reference Aortic root ID, ED 38 mm ----------  Left atrium Value Reference LA ID, A-P, ES 52 mm ---------- LA ID/bsa, A-P 2.06 cm/m^2 <=2.2 LA volume, S 139 ml ---------- LA volume/bsa, S 55.2 ml/m^2 ---------- LA volume, ES, 1-p A4C 145 ml ---------- LA volume/bsa, ES, 1-p A4C 57.6 ml/m^2 ---------- LA volume, ES, 1-p A2C 117 ml ---------- LA volume/bsa, ES, 1-p A2C 46.5 ml/m^2 ----------  Mitral valve Value Reference Mitral E-wave peak velocity 106 cm/s ---------- Mitral peak gradient, D 4 mm Hg ----------  Right atrium Value Reference RA ID, S-I, ES, A4C (H) 60.8 mm 34 - 49 RA area, ES, A4C 16.9 cm^2 8.3 - 19.5 RA volume, ES, A/L 38 ml ---------- RA volume/bsa, ES, A/L 15.1 ml/m^2 ----------  Right ventricle Value Reference RV s&', lateral, S 13.6 cm/s ----------  Legend: (L) and (H) mark values outside specified reference range.  ------------------------------------------------------------------- Prepared and Electronically Authenticated by  Kirk Ruths 2018-04-27T08:46:35   Transesophageal Echocardiography  Patient: [name], [name] MR #: [id number] Study Date: 04/21/2018 Gender: M Age: 89 Height: 177.8 cm Weight: 127.5 kg BSA: 2.57 m^2 Pt. Status: Room:  ADMITTING Candee Furbish, M.D. ATTENDING Candee Furbish, M.D. PERFORMING Candee Furbish, M.D. Windell Moment, Marlane Hatcher REFERRING Burtis Junes SONOGRAPHER Dance, Tiffany  cc:  ------------------------------------------------------------------- LV EF: 35% - 40%  ------------------------------------------------------------------- Indications: Atrial fibrillation - 427.31.  ------------------------------------------------------------------- History: PMH: Congestive heart failure. PMH: Left Atrial Enlargement. OSA. Risk factors: Hypertension.  ------------------------------------------------------------------- Study Conclusions  - Left ventricle: The cavity size was normal. Wall thickness was normal. Systolic function was moderately reduced. The estimated ejection fraction was in the range of 35% to 40%. Diffuse hypokinesis. - Aortic valve: No evidence of vegetation. There was moderate to severe regurgitation. Regurgitation pressure half-time: 230 ms. - Mitral valve: No evidence of vegetation. There was moderate to severe regurgitation directed posteriorly. - Left atrium: The atrium was dilated. No evidence of thrombus in the appendage. - Right atrium: The atrium was dilated. - Tricuspid valve: No evidence of vegetation. - Pulmonic valve: No evidence of vegetation.  Impressions:  - There is moderate to severe aortic regurgitation as well as moderate to severe mitral regurgitation. EF is also reduced. Consider valvular repair/replacement.  ------------------------------------------------------------------- Study data: Study status: Routine. Consent: The  risks, benefits, and alternatives to the procedure were explained to the patient and informed consent was obtained. Procedure: The patient reported no pain pre or post test. Initial setup. The patient was brought to the laboratory. Surface ECG leads were monitored. Sedation. Moderate sedation with intermittent deep sedation was administered by cardiology staff. Transesophageal echocardiography. An adult multiplane transesophageal probe was inserted by the attending cardiologistwith some difficulty. 3D image quality was adequate. Intravenous contrast (agitated saline) was administered. Study completion: The patient tolerated the procedure well. There were no complications. Administered medications: Diphenhydramine, 25mg , IV. Fentanyl, 34mcg, IV. Midazolam, 5mg , IV. Diagnostic transesophageal echocardiography. 2D and color Doppler. Birthdate: Patient birthdate: 04-19-1951. Age: Patient is 67 yr old. Sex: Gender: male. BMI: 40.3 kg/m^2. Blood pressure: 158/86 Patient status: Outpatient. Study date: Study date: 04/21/2018. Study  time: 11:44 AM. Location: Endoscopy.  -------------------------------------------------------------------  ------------------------------------------------------------------- Left ventricle: The cavity size was normal. Wall thickness was normal. Systolic function was moderately reduced. The estimated ejection fraction was in the range of 35% to 40%. Diffuse hypokinesis.  ------------------------------------------------------------------- Aortic valve: Mildly thickened leaflets. Cusp separation was normal. No evidence of vegetation. Doppler: There was moderate to severe regurgitation.  ------------------------------------------------------------------- Mitral valve: Mildly thickened leaflets . Leaflet separation was normal. No evidence of vegetation. Doppler: There was moderate to severe regurgitation directed  posteriorly.  ------------------------------------------------------------------- Left atrium: The atrium was dilated. No evidence of thrombus in the appendage. The appendage was of normal size. Emptying velocity was normal.  ------------------------------------------------------------------- Right ventricle: The cavity size was normal. Wall thickness was normal. Systolic function was normal.  ------------------------------------------------------------------- Pulmonic valve: Structurally normal valve. Cusp separation was normal. No evidence of vegetation. Doppler: There was trivial regurgitation.  ------------------------------------------------------------------- Tricuspid valve: Structurally normal valve. Leaflet separation was normal. No evidence of vegetation. Doppler: There was mild regurgitation.  ------------------------------------------------------------------- Pulmonary artery: The main pulmonary artery was normal-sized.  ------------------------------------------------------------------- Right atrium: The atrium was dilated.  ------------------------------------------------------------------- Measurements  Aortic valve Value Aortic regurg pressure half-time 230 ms  Aorta Value Aortic root ID, ED 39 mm Ascending aorta ID, A-P, S 40 mm  Mitral valve Value Mitral maximal regurg velocity, PISA 702 cm/s Mitral regurg VTI, PISA 200 cm Mitral ERO, PISA 0.49 cm^2 Mitral regurg volume, PISA 98 ml  Legend: (L) and (H) mark values outside specified reference range.  ------------------------------------------------------------------- Prepared and Electronically Authenticated by  Candee Furbish,  M.D. 2019-10-09T14:43:46   RIGHT/LEFT HEART CATH AND CORONARY ANGIOGRAPHY  Conclusion   Conclusions: 1. No angiographically significant coronary artery disease. 2. Mildly to moderately elevated left heart filling pressures. Prominent V-waves and elevated PCWP relative to LVEDP support moderate to severe mitral regurgitation noted on today's TEE. 3. Moderately elevated right heart and pulmonary artery pressures. 4. Moderately reduced Fick cardiac output/index. 5. New LBBB after crossing the aortic valve for left heart catheterization.  Recommendations: 1. Admit for observation to facilitate diuresis and medical optimization of acute on chronic systolic heart failure (LVEF 35-40% by TEE today) with significant valvular heart disease. 2. Monitor on telemetry, given new LBBB during/after catheterization. 3. Start heparin infusion 2 hours after TR band removal. If no evidence of bleeding or vascular injury, patient can be transitioned back to rivaroxaban tomorrow.  Recommend to resume Rivaroxaban, at currently prescribed dose and frequency, on 04/22/18 if no evidence of bleeding or vascular injury. Concurrent antiplatelet therapy not recommended.  Nelva Bush, MD [facility] Pager: [phone]   Indications   Persistent atrial fibrillation [I48.19 (ICD-10-CM)]  Preop cardiovascular exam [Z01.810 (ICD-10-CM)]  Procedural Details/Technique   Technical Details Indication: 67 y.o. year-old man with history of persistent atrial fibrillation, hypertension, chronic diastolic heart failure, and morbid obesity, presenting for cardiac catheterization due to shortness of breath in the setting of atrial fibrillation and plans for MAZE procedure.  GFR: >60 ml/min  Procedure: The risks, benefits, complications, treatment options, and expected outcomes were discussed with the patient. The patient and/or family concurred with the proposed plan, giving informed consent. The  patient was brought to the cath lab after IV hydration was begun and oral premedication was given. The patient was further sedated with Versed and Fentanyl. The right wrist was assessed with a modified Allens test which was normal. The right wrist and elbow were prepped and draped in a sterile fashion. 1% lidocaine was used for local anesthesia. A previously placed antecubital  vein IV was exchanged for a 101F slender Glidesheath using modified Seldinger technique. Right heart catheterization was performed by advancing a 101F balloon-tipped catheter through the right heart chambers into the pulmonary capillary wedge position. Pressure measurements and oxygen saturations were obtained.  Using the modified Seldinger access technique, a 101F slender Glidesheath was placed in the right radial artery. 3 mg Verapamil was given through the sheath. Heparin 5,000 units were administered. Selective coronary angiography was performed using 101F EBU3.5 and 101F JR4 catheters to engage the left and right coronary arteries, respectively. Attempts were made to engage the left coronary artery with 101F Jl3.5 and MPA2 catheters, but the left coronary artery could not be adequately opacified. Left heart catheterization was performed using a 101F JR4 catheter. Left ventriculogram was not performed.  At the end of the procedure, the radial artery sheath was removed and a TR band applied to achieve patent hemostasis. Patient was noted to develop a left bundle branch block after crossing the aortic valve. This persisted throughout the procedure and was asymptomatic. Otherwise, there were no immediate complications. The patient was taken to the recovery area in stable condition.  Contrast used: 110 mL Isovue Fluoroscopy time: 14.1 min Radiation dose: 870 mGy   Estimated blood loss <50 mL.  During this procedure the patient was administered the following to achieve and maintain moderate conscious sedation: Versed 1 mg, Fentanyl 50 mcg, while  the patient's heart rate, blood pressure, and oxygen saturation were continuously monitored. The period of conscious sedation was 47 minutes, of which I was present face-to-face 100% of this time.  Complications   Complications documented before study signed (04/22/2018 7:58 AM EDT)    No complications were associated with this study.  Documented by Nelva Bush, MD - 04/21/2018 7:22 PM EDT    Coronary Findings   Diagnostic  Dominance: Left  Left Main  Vessel is large. Vessel is angiographically normal. Short LMCA.  Left Anterior Descending  Vessel is large.  First Diagonal Branch  Vessel is small in size.  Second Diagonal Branch  Vessel is moderate in size.  Left Circumflex  Vessel is large. Vessel is angiographically normal.  First Obtuse Marginal Branch  Vessel is small in size.  Second Obtuse Marginal Branch  Vessel is moderate in size.  Left Posterior Descending Artery  Vessel is moderate in size.  First Left Posterolateral Branch  Vessel is moderate in size.  Second Left Posterolateral Branch  Vessel is small in size.  Right Coronary Artery  Vessel is moderate in size. Vessel is angiographically normal.  Intervention   No interventions have been documented.  Right Heart   Right Heart Pressures RA (mean): 16 mmHg RV (S/EDP): 52/16 mmHg PA (S/D, mean): 52/33 (39) mmHg PCWP (mean): 25-30 mmHg with prominent V-waves up to 45 mmHg.  Ao sat: 91% PA sat: 56%  Fick CO: 4.3 L/min Fick CI: 1.8 L/min/m^2  Left Heart   Left Ventricle LV end diastolic pressure is mildly elevated. LVEDP 20-25 mmHg.  Aortic Valve There is no aortic valve stenosis.  Coronary Diagrams   Diagnostic Diagram           CHEST - 2 VIEW  COMPARISON: Chest x-rays dated 05/04/2018 and 10/01/2011.  FINDINGS: Heart size and mediastinal contours are within normal limits. Lungs are clear. No pleural effusion or pneumothorax seen. No acute or suspicious osseous  finding.  IMPRESSION: No active cardiopulmonary disease. No evidence of pneumonia or pulmonary edema.   Electronically Signed By: Franki Cabot M.D. On:  05/14/2018 15:27   Impression:  Patient has stable symptoms of chronic combined systolic and diastolic congestive heart failure and long-standing persistent atrial fibrillation.I have personally reviewed the patient's recent transesophageal echocardiogram and diagnostic cardiac catheterization. TEE reveals moderate to severe aortic insufficiency and severe mitral regurgitation with nonischemic cardiomyopathy and moderate left ventricular systolic dysfunction. The aortic valve is trileaflet. There is no significant aortic stenosis. There is an eccentric jet of aortic insufficiency directed along the ventricular surface of the anterior leaflet of the mitral valve. There appears to be mild prolapse involving the right coronary cusp. There may also be some annular enlargement and mild fibrosis along the leaflet margins which contribute to the presence of aortic insufficiency. The jet of mitral regurgitation is central and for the most part there appears to be normal leaflet mobility consistent with type I mitral valve dysfunction secondary to annular enlargement, left ventricular dysfunction, and volume overload. There may be some restriction of the posterior leaflet, although for the most part the subvalvular apparatus in the posterior leaflet appears reasonably normal. There is moderate to severe left atrial enlargement. Diagnostic cardiac catheterization is notable for the absence of significant coronary artery disease. The patient has moderate pulmonary hypertension and large V waves on pulmonary capillary wedge tracing consistent with severe mitral regurgitation.  The patient needs aortic valve repair or replacement and mitral valve repair.He would likely benefit from a concomitant Maze procedure. Risks associated with  surgery will be somewhat elevated because of the patient's left ventricular dysfunction and comorbid medical conditions including morbid obesity.   Plan:  The patientand his wife wereagain counseled at length regarding the indications, risks and potential benefits of aortic valve repair or replacement andmitral valve repair. The rationale for elective surgery has been explained, including a comparison between surgery and continued medical therapy with close follow-up. The likelihood of successful and durable valve repair has been discussed with particular reference to the findings of their recent echocardiogram. Based upon these findings and previous experience, I have quoted them a 50% chance of successful aortic valve repair and greater than 95% of successful mitral valve repair. In the event thateithervalve cannot be successfully repaired, we discussed the possibility of replacing the aortic and/ormitral valve using a mechanical prosthesis with the attendant need for long-term anticoagulation versus the alternative of replacingthemusing a bioprosthetic tissue valve with its potential for late structural valve deterioration and failure, depending upon the patient's longevity. The patient specifically requests that if the [facility] must be replaced that theybe done using bioprosthetic tissuevalves.The relative risks and benefits of performing a maze procedure at the time of their surgery was discussed at length, including the expected likelihood of long term freedom from recurrent symptomatic atrial fibrillation and/or atrial flutter. The patient additionally provides consent for long term follow up following surgery including participation in the [facility]. Expectations for the patient's postoperative convalescence have been discussed.The patient understands and accepts all potential risks of surgery including but not limited to risk of death, stroke or other neurologic  complication, myocardial infarction, congestive heart failure, respiratory failure, renal failure, bleeding requiring transfusion and/or reexploration, arrhythmia, infection or other wound complications, pneumonia, pleural and/or pericardial effusion, pulmonary embolus, aortic dissection or other major vascular complication, or delayed complications related to valve repair or replacement including but not limited to structural valve deterioration and failure, thrombosis, embolization, endocarditis, or paravalvular leak. All of their questions have been answered.      Valentina Gu. Roxy Manns, MD 05/17/2018 2:38 PM

## 2018-05-17 NOTE — Patient Instructions (Signed)
Do not take Xarelto  Continue taking all other medications without change through the day before surgery.  Have nothing to eat or drink after midnight the night before surgery.  On the morning of surgery do not take any medications

## 2018-05-17 NOTE — Anesthesia Preprocedure Evaluation (Addendum)
Anesthesia Evaluation  Patient identified by MRN, date of birth, ID band Patient awake    Reviewed: Allergy & Precautions, H&P , NPO status , Patient's Chart, lab work & pertinent test results, reviewed documented beta blocker date and time   History of Anesthesia Complications (+) PONV  Airway Mallampati: III  TM Distance: >3 FB Neck ROM: Full    Dental no notable dental hx. (+) Teeth Intact, Dental Advisory Given   Pulmonary sleep apnea and Continuous Positive Airway Pressure Ventilation ,    Pulmonary exam normal breath sounds clear to auscultation       Cardiovascular Exercise Tolerance: Good hypertension, Pt. on medications and Pt. on home beta blockers +CHF  negative cardio ROS  + dysrhythmias Atrial Fibrillation + Valvular Problems/Murmurs AI and MR  Rhythm:Irregular Rate:Normal     Neuro/Psych negative neurological ROS  negative psych ROS   GI/Hepatic negative GI ROS, Neg liver ROS,   Endo/Other  negative endocrine ROS  Renal/GU negative Renal ROS  negative genitourinary   Musculoskeletal  (+) Arthritis , Osteoarthritis,    Abdominal   Peds  Hematology negative hematology ROS (+)   Anesthesia Other Findings   Reproductive/Obstetrics negative OB ROS                            Anesthesia Physical Anesthesia Plan  ASA: IV  Anesthesia Plan: General   Post-op Pain Management:    Induction: Intravenous  PONV Risk Score and Plan: 3 and Midazolam and Treatment may vary due to age or medical condition  Airway Management Planned: Oral ETT  Additional Equipment: Arterial line, CVP, PA Cath, TEE, 3D TEE and Ultrasound Guidance Line Placement  Intra-op Plan:   Post-operative Plan: Post-operative intubation/ventilation  Informed Consent: I have reviewed the patients History and Physical, chart, labs and discussed the procedure including the risks, benefits and alternatives for  the proposed anesthesia with the patient or authorized representative who has indicated his/her understanding and acceptance.   Dental advisory given  Plan Discussed with: CRNA  Anesthesia Plan Comments:         Anesthesia Quick Evaluation

## 2018-05-18 ENCOUNTER — Inpatient Hospital Stay (HOSPITAL_COMMUNITY): Payer: Medicare HMO

## 2018-05-18 ENCOUNTER — Inpatient Hospital Stay (HOSPITAL_COMMUNITY)
Admission: RE | Admit: 2018-05-18 | Discharge: 2018-05-24 | DRG: 220 | Disposition: A | Discharge status: Home / Self Care | Payer: Medicare HMO | Attending: Thoracic Surgery (Cardiothoracic Vascular Surgery) | Admitting: Thoracic Surgery (Cardiothoracic Vascular Surgery)

## 2018-05-18 ENCOUNTER — Inpatient Hospital Stay (HOSPITAL_COMMUNITY): Payer: Medicare HMO | Admitting: Certified Registered"

## 2018-05-18 ENCOUNTER — Encounter (HOSPITAL_COMMUNITY): Payer: Self-pay

## 2018-05-18 ENCOUNTER — Encounter (HOSPITAL_COMMUNITY)
Admission: RE | Disposition: A | Discharge status: Home / Self Care | Payer: Self-pay | Source: Home / Self Care | Attending: Thoracic Surgery (Cardiothoracic Vascular Surgery)

## 2018-05-18 DIAGNOSIS — I4891 Unspecified atrial fibrillation: Secondary | ICD-10-CM | POA: Diagnosis not present

## 2018-05-18 DIAGNOSIS — Z7901 Long term (current) use of anticoagulants: Secondary | ICD-10-CM | POA: Diagnosis not present

## 2018-05-18 DIAGNOSIS — Z885 Allergy status to narcotic agent status: Secondary | ICD-10-CM

## 2018-05-18 DIAGNOSIS — I272 Pulmonary hypertension, unspecified: Secondary | ICD-10-CM | POA: Diagnosis present

## 2018-05-18 DIAGNOSIS — J90 Pleural effusion, not elsewhere classified: Secondary | ICD-10-CM | POA: Diagnosis not present

## 2018-05-18 DIAGNOSIS — I447 Left bundle-branch block, unspecified: Secondary | ICD-10-CM | POA: Diagnosis not present

## 2018-05-18 DIAGNOSIS — D696 Thrombocytopenia, unspecified: Secondary | ICD-10-CM | POA: Diagnosis not present

## 2018-05-18 DIAGNOSIS — I472 Ventricular tachycardia: Secondary | ICD-10-CM | POA: Diagnosis not present

## 2018-05-18 DIAGNOSIS — Z96651 Presence of right artificial knee joint: Secondary | ICD-10-CM | POA: Diagnosis present

## 2018-05-18 DIAGNOSIS — I480 Paroxysmal atrial fibrillation: Secondary | ICD-10-CM | POA: Diagnosis not present

## 2018-05-18 DIAGNOSIS — I119 Hypertensive heart disease without heart failure: Secondary | ICD-10-CM | POA: Diagnosis present

## 2018-05-18 DIAGNOSIS — Z96642 Presence of left artificial hip joint: Secondary | ICD-10-CM | POA: Diagnosis present

## 2018-05-18 DIAGNOSIS — J9811 Atelectasis: Secondary | ICD-10-CM | POA: Diagnosis not present

## 2018-05-18 DIAGNOSIS — D72829 Elevated white blood cell count, unspecified: Secondary | ICD-10-CM | POA: Diagnosis not present

## 2018-05-18 DIAGNOSIS — N289 Disorder of kidney and ureter, unspecified: Secondary | ICD-10-CM | POA: Diagnosis not present

## 2018-05-18 DIAGNOSIS — I5032 Chronic diastolic (congestive) heart failure: Secondary | ICD-10-CM | POA: Diagnosis present

## 2018-05-18 DIAGNOSIS — I110 Hypertensive heart disease with heart failure: Secondary | ICD-10-CM | POA: Diagnosis present

## 2018-05-18 DIAGNOSIS — Z79899 Other long term (current) drug therapy: Secondary | ICD-10-CM

## 2018-05-18 DIAGNOSIS — I080 Rheumatic disorders of both mitral and aortic valves: Principal | ICD-10-CM | POA: Diagnosis present

## 2018-05-18 DIAGNOSIS — Z8679 Personal history of other diseases of the circulatory system: Secondary | ICD-10-CM

## 2018-05-18 DIAGNOSIS — Z974 Presence of external hearing-aid: Secondary | ICD-10-CM

## 2018-05-18 DIAGNOSIS — Z6841 Body Mass Index (BMI) 40.0 and over, adult: Secondary | ICD-10-CM

## 2018-05-18 DIAGNOSIS — J9382 Other air leak: Secondary | ICD-10-CM | POA: Diagnosis not present

## 2018-05-18 DIAGNOSIS — R001 Bradycardia, unspecified: Secondary | ICD-10-CM | POA: Diagnosis not present

## 2018-05-18 DIAGNOSIS — D62 Acute posthemorrhagic anemia: Secondary | ICD-10-CM | POA: Diagnosis not present

## 2018-05-18 DIAGNOSIS — Z9889 Other specified postprocedural states: Secondary | ICD-10-CM

## 2018-05-18 DIAGNOSIS — I350 Nonrheumatic aortic (valve) stenosis: Secondary | ICD-10-CM | POA: Diagnosis not present

## 2018-05-18 DIAGNOSIS — J811 Chronic pulmonary edema: Secondary | ICD-10-CM | POA: Diagnosis not present

## 2018-05-18 DIAGNOSIS — I4819 Other persistent atrial fibrillation: Secondary | ICD-10-CM

## 2018-05-18 DIAGNOSIS — G4733 Obstructive sleep apnea (adult) (pediatric): Secondary | ICD-10-CM | POA: Diagnosis present

## 2018-05-18 DIAGNOSIS — E669 Obesity, unspecified: Secondary | ICD-10-CM | POA: Diagnosis present

## 2018-05-18 DIAGNOSIS — Z09 Encounter for follow-up examination after completed treatment for conditions other than malignant neoplasm: Secondary | ICD-10-CM

## 2018-05-18 DIAGNOSIS — Z973 Presence of spectacles and contact lenses: Secondary | ICD-10-CM | POA: Diagnosis not present

## 2018-05-18 DIAGNOSIS — I4811 Longstanding persistent atrial fibrillation: Secondary | ICD-10-CM | POA: Diagnosis present

## 2018-05-18 DIAGNOSIS — I351 Nonrheumatic aortic (valve) insufficiency: Secondary | ICD-10-CM

## 2018-05-18 DIAGNOSIS — I340 Nonrheumatic mitral (valve) insufficiency: Secondary | ICD-10-CM

## 2018-05-18 DIAGNOSIS — I428 Other cardiomyopathies: Secondary | ICD-10-CM | POA: Diagnosis not present

## 2018-05-18 DIAGNOSIS — J939 Pneumothorax, unspecified: Secondary | ICD-10-CM | POA: Diagnosis not present

## 2018-05-18 HISTORY — DX: Personal history of other diseases of the circulatory system: Z86.79

## 2018-05-18 HISTORY — DX: Other specified postprocedural states: Z98.890

## 2018-05-18 HISTORY — PX: AORTIC VALVE REPAIR: SHX6306

## 2018-05-18 HISTORY — PX: TEE WITHOUT CARDIOVERSION: SHX5443

## 2018-05-18 HISTORY — PX: CLIPPING OF ATRIAL APPENDAGE: SHX5773

## 2018-05-18 HISTORY — PX: MITRAL VALVE REPAIR: SHX2039

## 2018-05-18 HISTORY — PX: MAZE: SHX5063

## 2018-05-18 LAB — POCT I-STAT, CHEM 8
BUN: 15 mg/dL (ref 8–23)
BUN: 14 mg/dL (ref 8–23)
BUN: 18 mg/dL (ref 8–23)
BUN: 15 mg/dL (ref 8–23)
BUN: 16 mg/dL (ref 8–23)
BUN: 16 mg/dL (ref 8–23)
BUN: 18 mg/dL (ref 8–23)
CALCIUM ION: 1.17 mmol/L (ref 1.15–1.40)
CALCIUM ION: 1.18 mmol/L (ref 1.15–1.40)
CHLORIDE: 103 mmol/L (ref 98–111)
Calcium, Ion: 1.08 mmol/L — ABNORMAL LOW (ref 1.15–1.40)
Calcium, Ion: 1.16 mmol/L (ref 1.15–1.40)
Calcium, Ion: 1.07 mmol/L — ABNORMAL LOW (ref 1.15–1.40)
Calcium, Ion: 1.1 mmol/L — ABNORMAL LOW (ref 1.15–1.40)
Calcium, Ion: 1.23 mmol/L (ref 1.15–1.40)
Chloride: 104 mmol/L (ref 98–111)
Chloride: 102 mmol/L (ref 98–111)
Chloride: 104 mmol/L (ref 98–111)
Chloride: 104 mmol/L (ref 98–111)
Chloride: 107 mmol/L (ref 98–111)
Chloride: 109 mmol/L (ref 98–111)
Creatinine, Ser: 0.9 mg/dL (ref 0.61–1.24)
Creatinine, Ser: 0.9 mg/dL (ref 0.61–1.24)
Creatinine, Ser: 0.9 mg/dL (ref 0.61–1.24)
Creatinine, Ser: 0.8 mg/dL (ref 0.61–1.24)
Creatinine, Ser: 0.9 mg/dL (ref 0.61–1.24)
Creatinine, Ser: 0.9 mg/dL (ref 0.61–1.24)
Creatinine, Ser: 1.2 mg/dL (ref 0.61–1.24)
GLUCOSE: 108 mg/dL — AB (ref 70–99)
GLUCOSE: 147 mg/dL — AB (ref 70–99)
Glucose, Bld: 102 mg/dL — ABNORMAL HIGH (ref 70–99)
Glucose, Bld: 108 mg/dL — ABNORMAL HIGH (ref 70–99)
Glucose, Bld: 159 mg/dL — ABNORMAL HIGH (ref 70–99)
Glucose, Bld: 128 mg/dL — ABNORMAL HIGH (ref 70–99)
Glucose, Bld: 131 mg/dL — ABNORMAL HIGH (ref 70–99)
HCT: 31 % — ABNORMAL LOW (ref 39.0–52.0)
HCT: 26 % — ABNORMAL LOW (ref 39.0–52.0)
HCT: 31 % — ABNORMAL LOW (ref 39.0–52.0)
HEMATOCRIT: 29 % — AB (ref 39.0–52.0)
HEMATOCRIT: 33 % — AB (ref 39.0–52.0)
HEMATOCRIT: 27 % — AB (ref 39.0–52.0)
HEMATOCRIT: 29 % — AB (ref 39.0–52.0)
HEMOGLOBIN: 11.2 g/dL — AB (ref 13.0–17.0)
HEMOGLOBIN: 9.9 g/dL — AB (ref 13.0–17.0)
HEMOGLOBIN: 9.2 g/dL — AB (ref 13.0–17.0)
HEMOGLOBIN: 9.9 g/dL — AB (ref 13.0–17.0)
Hemoglobin: 10.5 g/dL — ABNORMAL LOW (ref 13.0–17.0)
Hemoglobin: 8.8 g/dL — ABNORMAL LOW (ref 13.0–17.0)
Hemoglobin: 10.5 g/dL — ABNORMAL LOW (ref 13.0–17.0)
POTASSIUM: 4.5 mmol/L (ref 3.5–5.1)
Potassium: 3.9 mmol/L (ref 3.5–5.1)
Potassium: 3.9 mmol/L (ref 3.5–5.1)
Potassium: 4.1 mmol/L (ref 3.5–5.1)
Potassium: 4.7 mmol/L (ref 3.5–5.1)
Potassium: 4.5 mmol/L (ref 3.5–5.1)
Potassium: 5 mmol/L (ref 3.5–5.1)
SODIUM: 139 mmol/L (ref 135–145)
SODIUM: 139 mmol/L (ref 135–145)
SODIUM: 135 mmol/L (ref 135–145)
SODIUM: 138 mmol/L (ref 135–145)
SODIUM: 138 mmol/L (ref 135–145)
Sodium: 138 mmol/L (ref 135–145)
Sodium: 137 mmol/L (ref 135–145)
TCO2: 25 mmol/L (ref 22–32)
TCO2: 25 mmol/L (ref 22–32)
TCO2: 26 mmol/L (ref 22–32)
TCO2: 27 mmol/L (ref 22–32)
TCO2: 25 mmol/L (ref 22–32)
TCO2: 22 mmol/L (ref 22–32)
TCO2: 22 mmol/L (ref 22–32)

## 2018-05-18 LAB — POCT I-STAT 3, ART BLOOD GAS (G3+)
ACID-BASE DEFICIT: 1 mmol/L (ref 0.0–2.0)
ACID-BASE DEFICIT: 4 mmol/L — AB (ref 0.0–2.0)
Acid-base deficit: 7 mmol/L — ABNORMAL HIGH (ref 0.0–2.0)
Acid-base deficit: 1 mmol/L (ref 0.0–2.0)
BICARBONATE: 20.9 mmol/L (ref 20.0–28.0)
Bicarbonate: 24.3 mmol/L (ref 20.0–28.0)
Bicarbonate: 23.1 mmol/L (ref 20.0–28.0)
Bicarbonate: 21.3 mmol/L (ref 20.0–28.0)
O2 SAT: 98 %
O2 SAT: 96 %
O2 Saturation: 100 %
O2 Saturation: 100 %
PCO2 ART: 49.3 mmHg — AB (ref 32.0–48.0)
PO2 ART: 298 mmHg — AB (ref 83.0–108.0)
PO2 ART: 129 mmHg — AB (ref 83.0–108.0)
PO2 ART: 88 mmHg (ref 83.0–108.0)
Patient temperature: 36.9
Patient temperature: 37.3
Patient temperature: 37.7
TCO2: 26 mmol/L (ref 22–32)
TCO2: 22 mmol/L (ref 22–32)
TCO2: 24 mmol/L (ref 22–32)
TCO2: 22 mmol/L (ref 22–32)
pCO2 arterial: 41.3 mmHg (ref 32.0–48.0)
pCO2 arterial: 36.2 mmHg (ref 32.0–48.0)
pCO2 arterial: 38.4 mmHg (ref 32.0–48.0)
pH, Arterial: 7.379 (ref 7.350–7.450)
pH, Arterial: 7.234 — ABNORMAL LOW (ref 7.350–7.450)
pH, Arterial: 7.413 (ref 7.350–7.450)
pH, Arterial: 7.356 (ref 7.350–7.450)
pO2, Arterial: 222 mmHg — ABNORMAL HIGH (ref 83.0–108.0)

## 2018-05-18 LAB — GLUCOSE, CAPILLARY
GLUCOSE-CAPILLARY: 112 mg/dL — AB (ref 70–99)
GLUCOSE-CAPILLARY: 127 mg/dL — AB (ref 70–99)
Glucose-Capillary: 123 mg/dL — ABNORMAL HIGH (ref 70–99)
Glucose-Capillary: 130 mg/dL — ABNORMAL HIGH (ref 70–99)
Glucose-Capillary: 124 mg/dL — ABNORMAL HIGH (ref 70–99)
Glucose-Capillary: 129 mg/dL — ABNORMAL HIGH (ref 70–99)

## 2018-05-18 LAB — CREATININE, SERUM
Creatinine, Ser: 1.21 mg/dL (ref 0.61–1.24)
GFR calc non Af Amer: >60 mL/min (ref >60)

## 2018-05-18 LAB — CBC
HEMATOCRIT: 36.6 % — AB (ref 39.0–52.0)
HEMATOCRIT: 35.7 % — AB (ref 39.0–52.0)
Hemoglobin: 11.5 g/dL — ABNORMAL LOW (ref 13.0–17.0)
Hemoglobin: 11.6 g/dL — ABNORMAL LOW (ref 13.0–17.0)
MCH: 27.3 pg (ref 26.0–34.0)
MCH: 27.9 pg (ref 26.0–34.0)
MCHC: 31.4 g/dL (ref 30.0–36.0)
MCHC: 32.5 g/dL (ref 30.0–36.0)
MCV: 86.9 fL (ref 80.0–100.0)
MCV: 85.8 fL (ref 80.0–100.0)
PLATELETS: 146 10*3/uL — AB (ref 150–400)
Platelets: 162 10*3/uL (ref 150–400)
RBC: 4.21 MIL/uL — ABNORMAL LOW (ref 4.22–5.81)
RBC: 4.16 MIL/uL — ABNORMAL LOW (ref 4.22–5.81)
RDW: 15.9 % — ABNORMAL HIGH (ref 11.5–15.5)
RDW: 15.9 % — AB (ref 11.5–15.5)
WBC: 19.6 10*3/uL — AB (ref 4.0–10.5)
WBC: 18 10*3/uL — ABNORMAL HIGH (ref 4.0–10.5)
nRBC: 0 % (ref 0.0–0.2)
nRBC: 0 % (ref 0.0–0.2)

## 2018-05-18 LAB — POCT I-STAT 4, (NA,K, GLUC, HGB,HCT)
Glucose, Bld: 119 mg/dL — ABNORMAL HIGH (ref 70–99)
HCT: 33 % — ABNORMAL LOW (ref 39.0–52.0)
Hemoglobin: 11.2 g/dL — ABNORMAL LOW (ref 13.0–17.0)
Potassium: 4.9 mmol/L (ref 3.5–5.1)
Sodium: 139 mmol/L (ref 135–145)

## 2018-05-18 LAB — COOXEMETRY PANEL
Carboxyhemoglobin: 1 % (ref 0.5–1.5)
Methemoglobin: 1.6 % — ABNORMAL HIGH (ref 0.0–1.5)
O2 Saturation: 48.2 %
TOTAL HEMOGLOBIN: 12.1 g/dL (ref 12.0–16.0)

## 2018-05-18 LAB — PROTIME-INR
INR: 1.46
Prothrombin Time: 17.6 seconds — ABNORMAL HIGH (ref 11.4–15.2)

## 2018-05-18 LAB — MAGNESIUM: Magnesium: 3.2 mg/dL — ABNORMAL HIGH (ref 1.7–2.4)

## 2018-05-18 LAB — PLATELET COUNT: Platelets: 123 10*3/uL — ABNORMAL LOW (ref 150–400)

## 2018-05-18 LAB — APTT: aPTT: 32 seconds (ref 24–36)

## 2018-05-18 LAB — HEMOGLOBIN AND HEMATOCRIT, BLOOD
HCT: 29.9 % — ABNORMAL LOW (ref 39.0–52.0)
Hemoglobin: 9.6 g/dL — ABNORMAL LOW (ref 13.0–17.0)

## 2018-05-18 SURGERY — REPAIR, AORTIC VALVE
Anesthesia: General | Site: Chest

## 2018-05-18 MED ORDER — DEXMEDETOMIDINE HCL IN NACL 400 MCG/100ML IV SOLN
0.4000 ug/kg/h | INTRAVENOUS | Status: DC
  Administered 2018-05-18: 0.6 ug/kg/h via INTRAVENOUS
  Administered 2018-05-18 – 2018-05-19 (×2): 0.4 ug/kg/h via INTRAVENOUS
  Filled 2018-05-18 (×2): qty 100

## 2018-05-18 MED ORDER — SODIUM CHLORIDE 0.9 % IV SOLN
INTRAVENOUS | Status: DC
  Administered 2018-05-18: 16:00:00 via INTRAVENOUS

## 2018-05-18 MED ORDER — POTASSIUM CHLORIDE 10 MEQ/50ML IV SOLN: 10.0000 meq | INTRAVENOUS | Status: AC

## 2018-05-18 MED ORDER — ROCURONIUM BROMIDE 10 MG/ML (PF) SYRINGE
PREFILLED_SYRINGE | INTRAVENOUS | Status: DC | PRN
  Administered 2018-05-18 (×2): 50 mg via INTRAVENOUS
  Administered 2018-05-18: 100 mg via INTRAVENOUS
  Administered 2018-05-18 (×3): 50 mg via INTRAVENOUS

## 2018-05-18 MED ORDER — SODIUM CHLORIDE 0.9 % IV SOLN
INTRAVENOUS | Status: DC | PRN
  Administered 2018-05-18: 750 mg via INTRAVENOUS

## 2018-05-18 MED ORDER — LACTATED RINGERS IV SOLN: 500.0000 mL | Freq: Once | INTRAVENOUS | Status: DC | PRN

## 2018-05-18 MED ORDER — MORPHINE SULFATE (PF) 2 MG/ML IV SOLN
1.0000 mg | INTRAVENOUS | Status: DC | PRN
  Administered 2018-05-19 (×3): 2 mg via INTRAVENOUS
  Administered 2018-05-19: 1 mg via INTRAVENOUS
  Administered 2018-05-19: 2 mg via INTRAVENOUS
  Administered 2018-05-20: 1 mg via INTRAVENOUS
  Administered 2018-05-20: 2 mg via INTRAVENOUS
  Filled 2018-05-18 (×8): qty 1

## 2018-05-18 MED ORDER — PROTAMINE SULFATE 10 MG/ML IV SOLN
INTRAVENOUS | Status: AC
  Filled 2018-05-18: qty 25

## 2018-05-18 MED ORDER — MILRINONE LACTATE IN DEXTROSE 20-5 MG/100ML-% IV SOLN
0.2500 ug/kg/min | INTRAVENOUS | Status: DC
  Administered 2018-05-18 – 2018-05-19 (×3): 0.25 ug/kg/min via INTRAVENOUS
  Filled 2018-05-18 (×4): qty 100

## 2018-05-18 MED ORDER — ROCURONIUM BROMIDE 50 MG/5ML IV SOSY
PREFILLED_SYRINGE | INTRAVENOUS | Status: AC
  Filled 2018-05-18: qty 5

## 2018-05-18 MED ORDER — LACTATED RINGERS IV SOLN
INTRAVENOUS | Status: DC | PRN
  Administered 2018-05-18: 07:00:00 via INTRAVENOUS

## 2018-05-18 MED ORDER — OXYCODONE HCL 5 MG PO TABS: 5.0000 mg | ORAL_TABLET | ORAL | Status: DC | PRN

## 2018-05-18 MED ORDER — SODIUM CHLORIDE 0.9 % IV SOLN
INTRAVENOUS | Status: DC | PRN
  Administered 2018-05-18: 1 [IU]/h via INTRAVENOUS

## 2018-05-18 MED ORDER — DOCUSATE SODIUM 100 MG PO CAPS
200.0000 mg | ORAL_CAPSULE | Freq: Every day | ORAL | Status: DC
  Administered 2018-05-19 – 2018-05-24 (×4): 200 mg via ORAL
  Filled 2018-05-18 (×4): qty 2

## 2018-05-18 MED ORDER — BISACODYL 10 MG RE SUPP
10.0000 mg | Freq: Every day | RECTAL | Status: DC
  Filled 2018-05-18: qty 1

## 2018-05-18 MED ORDER — FENTANYL CITRATE (PF) 250 MCG/5ML IJ SOLN
INTRAMUSCULAR | Status: DC | PRN
  Administered 2018-05-18: 100 ug via INTRAVENOUS
  Administered 2018-05-18 (×2): 25 ug via INTRAVENOUS
  Administered 2018-05-18: 50 ug via INTRAVENOUS
  Administered 2018-05-18: 650 ug via INTRAVENOUS
  Administered 2018-05-18: 100 ug via INTRAVENOUS
  Administered 2018-05-18: 50 ug via INTRAVENOUS
  Administered 2018-05-18: 150 ug via INTRAVENOUS
  Administered 2018-05-18 (×2): 25 ug via INTRAVENOUS
  Administered 2018-05-18: 50 ug via INTRAVENOUS

## 2018-05-18 MED ORDER — ASPIRIN EC 325 MG PO TBEC
325.0000 mg | DELAYED_RELEASE_TABLET | Freq: Every day | ORAL | Status: DC
  Administered 2018-05-19: 325 mg via ORAL
  Filled 2018-05-18: qty 1

## 2018-05-18 MED ORDER — SODIUM CHLORIDE 0.9 % IV SOLN: 250.0000 mL | INTRAVENOUS | Status: DC

## 2018-05-18 MED ORDER — PANTOPRAZOLE SODIUM 40 MG PO TBEC
40.0000 mg | DELAYED_RELEASE_TABLET | Freq: Every day | ORAL | Status: DC
  Administered 2018-05-20 – 2018-05-24 (×5): 40 mg via ORAL
  Filled 2018-05-18 (×5): qty 1

## 2018-05-18 MED ORDER — METOPROLOL TARTRATE 25 MG/10 ML ORAL SUSPENSION: 12.5000 mg | Freq: Two times a day (BID) | ORAL | Status: DC

## 2018-05-18 MED ORDER — TRAMADOL HCL 50 MG PO TABS
50.0000 mg | ORAL_TABLET | ORAL | Status: DC | PRN
  Administered 2018-05-19 – 2018-05-22 (×11): 100 mg via ORAL
  Filled 2018-05-18 (×11): qty 2

## 2018-05-18 MED ORDER — DEXMEDETOMIDINE HCL IN NACL 200 MCG/50ML IV SOLN
0.0000 ug/kg/h | INTRAVENOUS | Status: DC
  Filled 2018-05-18: qty 50

## 2018-05-18 MED ORDER — CHLORHEXIDINE GLUCONATE 0.12 % MT SOLN
15.0000 mL | Freq: Once | OROMUCOSAL | Status: AC
  Administered 2018-05-18: 15 mL via OROMUCOSAL
  Filled 2018-05-18: qty 15

## 2018-05-18 MED ORDER — CHLORHEXIDINE GLUCONATE 0.12 % MT SOLN
15.0000 mL | OROMUCOSAL | Status: AC
  Administered 2018-05-18: 15 mL via OROMUCOSAL

## 2018-05-18 MED ORDER — ACETAMINOPHEN 160 MG/5ML PO SOLN
1000.0000 mg | Freq: Four times a day (QID) | ORAL | Status: AC
  Administered 2018-05-18: 1000 mg
  Filled 2018-05-18: qty 40.6

## 2018-05-18 MED ORDER — VANCOMYCIN HCL IN DEXTROSE 1-5 GM/200ML-% IV SOLN
1000.0000 mg | Freq: Once | INTRAVENOUS | Status: AC
  Administered 2018-05-18: 1000 mg via INTRAVENOUS
  Filled 2018-05-18: qty 200

## 2018-05-18 MED ORDER — LACTATED RINGERS IV SOLN: INTRAVENOUS | Status: DC

## 2018-05-18 MED ORDER — SODIUM CHLORIDE 0.9 % IJ SOLN
INTRAMUSCULAR | Status: AC
  Filled 2018-05-18: qty 10

## 2018-05-18 MED ORDER — SODIUM CHLORIDE 0.9% FLUSH
3.0000 mL | INTRAVENOUS | Status: DC | PRN
  Administered 2018-05-20 (×2): 3 mL via INTRAVENOUS
  Filled 2018-05-18 (×2): qty 3

## 2018-05-18 MED ORDER — SODIUM CHLORIDE 0.9% FLUSH
3.0000 mL | Freq: Two times a day (BID) | INTRAVENOUS | Status: DC
  Administered 2018-05-19 – 2018-05-22 (×5): 3 mL via INTRAVENOUS

## 2018-05-18 MED ORDER — INSULIN REGULAR BOLUS VIA INFUSION
0.0000 [IU] | Freq: Three times a day (TID) | INTRAVENOUS | Status: DC
  Filled 2018-05-18: qty 10

## 2018-05-18 MED ORDER — PHENYLEPHRINE 40 MCG/ML (10ML) SYRINGE FOR IV PUSH (FOR BLOOD PRESSURE SUPPORT)
PREFILLED_SYRINGE | INTRAVENOUS | Status: AC
  Filled 2018-05-18: qty 20

## 2018-05-18 MED ORDER — METOPROLOL TARTRATE 12.5 MG HALF TABLET
12.5000 mg | ORAL_TABLET | Freq: Once | ORAL | Status: DC
  Filled 2018-05-18: qty 1

## 2018-05-18 MED ORDER — HEPARIN SODIUM (PORCINE) 1000 UNIT/ML IJ SOLN
INTRAMUSCULAR | Status: DC | PRN
  Administered 2018-05-18: 40000 [IU] via INTRAVENOUS

## 2018-05-18 MED ORDER — ROCURONIUM BROMIDE 50 MG/5ML IV SOSY
PREFILLED_SYRINGE | INTRAVENOUS | Status: AC
  Filled 2018-05-18: qty 10

## 2018-05-18 MED ORDER — MIDAZOLAM HCL 2 MG/2ML IJ SOLN
INTRAMUSCULAR | Status: AC
  Filled 2018-05-18: qty 2

## 2018-05-18 MED ORDER — ONDANSETRON HCL 4 MG/2ML IJ SOLN
4.0000 mg | Freq: Four times a day (QID) | INTRAMUSCULAR | Status: DC | PRN
  Administered 2018-05-19 – 2018-05-20 (×2): 4 mg via INTRAVENOUS
  Filled 2018-05-18 (×2): qty 2

## 2018-05-18 MED ORDER — GLYCOPYRROLATE PF 0.2 MG/ML IJ SOSY
PREFILLED_SYRINGE | INTRAMUSCULAR | Status: AC
  Filled 2018-05-18: qty 1

## 2018-05-18 MED ORDER — SODIUM CHLORIDE 0.9 % IV SOLN
INTRAVENOUS | Status: DC | PRN
  Administered 2018-05-18: 40 ug/min via INTRAVENOUS

## 2018-05-18 MED ORDER — MIDAZOLAM HCL 5 MG/5ML IJ SOLN
INTRAMUSCULAR | Status: DC | PRN
  Administered 2018-05-18 (×2): 2 mg via INTRAVENOUS
  Administered 2018-05-18: 3 mg via INTRAVENOUS
  Administered 2018-05-18: 0.5 mg via INTRAVENOUS
  Administered 2018-05-18: 1.5 mg via INTRAVENOUS
  Administered 2018-05-18: 1 mg via INTRAVENOUS
  Administered 2018-05-18: 2 mg via INTRAVENOUS

## 2018-05-18 MED ORDER — PROPOFOL 10 MG/ML IV BOLUS
INTRAVENOUS | Status: DC | PRN
  Administered 2018-05-18: 50 mg via INTRAVENOUS

## 2018-05-18 MED ORDER — PROTAMINE SULFATE 10 MG/ML IV SOLN
INTRAVENOUS | Status: DC | PRN
  Administered 2018-05-18: 20 mg via INTRAVENOUS
  Administered 2018-05-18: 330 mg via INTRAVENOUS

## 2018-05-18 MED ORDER — ORAL CARE MOUTH RINSE
15.0000 mL | OROMUCOSAL | Status: DC
  Administered 2018-05-18 – 2018-05-19 (×4): 15 mL via OROMUCOSAL

## 2018-05-18 MED ORDER — CHLORHEXIDINE GLUCONATE 4 % EX LIQD: 30.0000 mL | CUTANEOUS | Status: DC

## 2018-05-18 MED ORDER — FENTANYL CITRATE (PF) 250 MCG/5ML IJ SOLN
INTRAMUSCULAR | Status: AC
  Filled 2018-05-18: qty 25

## 2018-05-18 MED ORDER — HEPARIN SODIUM (PORCINE) 1000 UNIT/ML IJ SOLN
INTRAMUSCULAR | Status: AC
  Filled 2018-05-18: qty 1

## 2018-05-18 MED ORDER — SODIUM CHLORIDE 0.9 % IJ SOLN
OROMUCOSAL | Status: DC | PRN
  Administered 2018-05-18 (×2): 4 mL via TOPICAL

## 2018-05-18 MED ORDER — TRANEXAMIC ACID 1000 MG/10ML IV SOLN
INTRAVENOUS | Status: DC | PRN
  Administered 2018-05-18: 1.5 mg/kg/h via INTRAVENOUS

## 2018-05-18 MED ORDER — SODIUM CHLORIDE 0.45 % IV SOLN: INTRAVENOUS | Status: DC | PRN

## 2018-05-18 MED ORDER — SODIUM CHLORIDE 0.9 % IR SOLN
Status: DC | PRN
  Administered 2018-05-18: 5000 mL

## 2018-05-18 MED ORDER — DEXMEDETOMIDINE HCL IN NACL 400 MCG/100ML IV SOLN
INTRAVENOUS | Status: DC | PRN
  Administered 2018-05-18: 0.7 ug/kg/h via INTRAVENOUS

## 2018-05-18 MED ORDER — MIDAZOLAM HCL 2 MG/2ML IJ SOLN
2.0000 mg | INTRAMUSCULAR | Status: DC | PRN
  Administered 2018-05-18: 2 mg via INTRAVENOUS
  Filled 2018-05-18 (×2): qty 2

## 2018-05-18 MED ORDER — MAGNESIUM SULFATE 4 GM/100ML IV SOLN
4.0000 g | Freq: Once | INTRAVENOUS | Status: AC
  Administered 2018-05-18: 4 g via INTRAVENOUS
  Filled 2018-05-18: qty 100

## 2018-05-18 MED ORDER — LACTATED RINGERS IV SOLN: INTRAVENOUS | Status: DC | PRN

## 2018-05-18 MED ORDER — FAMOTIDINE IN NACL 20-0.9 MG/50ML-% IV SOLN
20.0000 mg | Freq: Two times a day (BID) | INTRAVENOUS | Status: AC
  Administered 2018-05-18 (×2): 20 mg via INTRAVENOUS
  Filled 2018-05-18 (×2): qty 50

## 2018-05-18 MED ORDER — LACTATED RINGERS IV SOLN
INTRAVENOUS | Status: DC | PRN
  Administered 2018-05-18 (×2): via INTRAVENOUS

## 2018-05-18 MED ORDER — ACETAMINOPHEN 650 MG RE SUPP
650.0000 mg | Freq: Once | RECTAL | Status: AC
  Administered 2018-05-18: 650 mg via RECTAL

## 2018-05-18 MED ORDER — ALBUMIN HUMAN 5 % IV SOLN
INTRAVENOUS | Status: DC | PRN
  Administered 2018-05-18: 14:00:00 via INTRAVENOUS

## 2018-05-18 MED ORDER — ASPIRIN 81 MG PO CHEW: 324.0000 mg | CHEWABLE_TABLET | Freq: Every day | ORAL | Status: DC

## 2018-05-18 MED ORDER — PHENYLEPHRINE HCL-NACL 20-0.9 MG/250ML-% IV SOLN
0.0000 ug/min | INTRAVENOUS | Status: DC
  Administered 2018-05-18: 90 ug/min via INTRAVENOUS
  Administered 2018-05-19: 70 ug/min via INTRAVENOUS
  Administered 2018-05-19: 65 ug/min via INTRAVENOUS
  Filled 2018-05-18 (×3): qty 250

## 2018-05-18 MED ORDER — NITROGLYCERIN IN D5W 200-5 MCG/ML-% IV SOLN: 0.0000 ug/min | INTRAVENOUS | Status: DC

## 2018-05-18 MED ORDER — ALBUMIN HUMAN 5 % IV SOLN
250.0000 mL | INTRAVENOUS | Status: AC | PRN
  Administered 2018-05-18 (×2): 12.5 g via INTRAVENOUS
  Filled 2018-05-18: qty 500

## 2018-05-18 MED ORDER — SODIUM CHLORIDE 0.9 % IV SOLN
INTRAVENOUS | Status: DC | PRN
  Administered 2018-05-18: 14:00:00 via INTRAVENOUS

## 2018-05-18 MED ORDER — CHLORHEXIDINE GLUCONATE 0.12% ORAL RINSE (MEDLINE KIT)
15.0000 mL | Freq: Two times a day (BID) | OROMUCOSAL | Status: DC
  Administered 2018-05-18: 15 mL via OROMUCOSAL

## 2018-05-18 MED ORDER — ARTIFICIAL TEARS OPHTHALMIC OINT
TOPICAL_OINTMENT | OPHTHALMIC | Status: AC
  Filled 2018-05-18: qty 3.5

## 2018-05-18 MED ORDER — DOPAMINE-DEXTROSE 3.2-5 MG/ML-% IV SOLN: 0.0000 ug/kg/min | INTRAVENOUS | Status: DC

## 2018-05-18 MED ORDER — SODIUM CHLORIDE 0.9 % IV SOLN
1.5000 g | Freq: Two times a day (BID) | INTRAVENOUS | Status: AC
  Administered 2018-05-18 – 2018-05-20 (×4): 1.5 g via INTRAVENOUS
  Filled 2018-05-18 (×4): qty 1.5

## 2018-05-18 MED ORDER — VASOPRESSIN 20 UNIT/ML IV SOLN
INTRAVENOUS | Status: AC
  Filled 2018-05-18: qty 1

## 2018-05-18 MED ORDER — METOPROLOL TARTRATE 5 MG/5ML IV SOLN: 2.5000 mg | INTRAVENOUS | Status: DC | PRN

## 2018-05-18 MED ORDER — BISACODYL 5 MG PO TBEC
10.0000 mg | DELAYED_RELEASE_TABLET | Freq: Every day | ORAL | Status: DC
  Administered 2018-05-19 – 2018-05-24 (×6): 10 mg via ORAL
  Filled 2018-05-18 (×6): qty 2

## 2018-05-18 MED ORDER — ACETAMINOPHEN 500 MG PO TABS
1000.0000 mg | ORAL_TABLET | Freq: Four times a day (QID) | ORAL | Status: AC
  Administered 2018-05-19 – 2018-05-23 (×15): 1000 mg via ORAL
  Filled 2018-05-18 (×17): qty 2

## 2018-05-18 MED ORDER — MIDAZOLAM HCL 10 MG/2ML IJ SOLN
INTRAMUSCULAR | Status: AC
  Filled 2018-05-18: qty 2

## 2018-05-18 MED ORDER — PROTAMINE SULFATE 10 MG/ML IV SOLN
INTRAVENOUS | Status: AC
  Filled 2018-05-18: qty 5

## 2018-05-18 MED ORDER — HEMOSTATIC AGENTS (NO CHARGE) OPTIME
TOPICAL | Status: DC | PRN
  Administered 2018-05-18: 1 via TOPICAL

## 2018-05-18 MED ORDER — EPHEDRINE 5 MG/ML INJ
INTRAVENOUS | Status: AC
  Filled 2018-05-18: qty 10

## 2018-05-18 MED ORDER — METOPROLOL TARTRATE 12.5 MG HALF TABLET: 12.5000 mg | ORAL_TABLET | Freq: Two times a day (BID) | ORAL | Status: DC

## 2018-05-18 MED ORDER — MORPHINE SULFATE (PF) 2 MG/ML IV SOLN
1.0000 mg | INTRAVENOUS | Status: DC | PRN
  Administered 2018-05-18: 4 mg via INTRAVENOUS
  Filled 2018-05-18: qty 2

## 2018-05-18 MED ORDER — PHENYLEPHRINE 40 MCG/ML (10ML) SYRINGE FOR IV PUSH (FOR BLOOD PRESSURE SUPPORT)
PREFILLED_SYRINGE | INTRAVENOUS | Status: DC | PRN
  Administered 2018-05-18: 80 ug via INTRAVENOUS
  Administered 2018-05-18 (×2): 120 ug via INTRAVENOUS

## 2018-05-18 MED ORDER — PROPOFOL 10 MG/ML IV BOLUS
INTRAVENOUS | Status: AC
  Filled 2018-05-18: qty 20

## 2018-05-18 MED ORDER — INSULIN REGULAR(HUMAN) IN NACL 100-0.9 UT/100ML-% IV SOLN
INTRAVENOUS | Status: DC
  Filled 2018-05-18: qty 100

## 2018-05-18 MED ORDER — CHLORHEXIDINE GLUCONATE CLOTH 2 % EX PADS
6.0000 | MEDICATED_PAD | Freq: Every day | CUTANEOUS | Status: DC
  Administered 2018-05-19: 6 via TOPICAL

## 2018-05-18 MED ORDER — ACETAMINOPHEN 160 MG/5ML PO SOLN: 650.0000 mg | Freq: Once | ORAL | Status: AC

## 2018-05-18 MED ORDER — GLYCOPYRROLATE 0.2 MG/ML IJ SOLN
INTRAMUSCULAR | Status: DC | PRN
  Administered 2018-05-18 (×2): .1 mg via INTRAVENOUS

## 2018-05-18 SURGICAL SUPPLY — 141 items
ADAPTER CARDIO PERF ANTE/RETRO (ADAPTER) ×4 IMPLANT
APPLICATOR COTTON TIP 6IN STRL (MISCELLANEOUS) IMPLANT
ARTICLIP LAA PROCLIP II 45 (Clip) ×4 IMPLANT
ATTRACTOMAT 16X20 MAGNETIC DRP (DRAPES) ×4 IMPLANT
BAG DECANTER FOR FLEXI CONT (MISCELLANEOUS) ×4 IMPLANT
BLADE STERNUM SYSTEM 6 (BLADE) ×4 IMPLANT
BLADE SURG 11 STRL SS (BLADE) ×8 IMPLANT
BLADE SURG ROTATE 9660 (MISCELLANEOUS) ×4 IMPLANT
CANISTER SUCT 3000ML PPV (MISCELLANEOUS) ×8 IMPLANT
CANN PRFSN 3/8X14X24FR PCFC (MISCELLANEOUS)
CANN PRFSN 3/8XCNCT ST RT ANG (MISCELLANEOUS)
CANNULA AORTIC ROOT 9FR (CANNULA) ×4 IMPLANT
CANNULA EZ GLIDE AORTIC 21FR (CANNULA) ×4 IMPLANT
CANNULA FEM VENOUS REMOTE 22FR (CANNULA) ×4 IMPLANT
CANNULA GUNDRY RCSP 15FR (MISCELLANEOUS) ×4 IMPLANT
CANNULA OPTISITE PERFUSION 20F (CANNULA) ×4 IMPLANT
CANNULA PRFSN 3/8X14X24FR PCFC (MISCELLANEOUS) IMPLANT
CANNULA PRFSN 3/8XCNCT RT ANG (MISCELLANEOUS) IMPLANT
CANNULA SOFTFLOW AORTIC 7M21FR (CANNULA) ×4 IMPLANT
CANNULA VEN MTL TIP RT (MISCELLANEOUS)
CANNULA VENNOUS METAL TIP 20FR (CANNULA) ×4 IMPLANT
CATH CPB KIT OWEN (MISCELLANEOUS) ×4 IMPLANT
CATH FOLEY 2WAY SLVR  5CC 14FR (CATHETERS)
CATH FOLEY 2WAY SLVR 5CC 14FR (CATHETERS) IMPLANT
CATH HEART VENT LEFT (CATHETERS) ×3 IMPLANT
CATH THORACIC 28FR RT ANG (CATHETERS) IMPLANT
CATH THORACIC 36FR (CATHETERS) ×4 IMPLANT
CATH THORACIC 36FR RT ANG (CATHETERS) ×4 IMPLANT
CLAMP ISOLATOR SYNERGY LG (MISCELLANEOUS) IMPLANT
CLAMP OLL ABLATION (MISCELLANEOUS) ×4 IMPLANT
CLIP FOGARTY SPRING 6M (CLIP) IMPLANT
CONN 1/2X1/2X1/2  BEN (MISCELLANEOUS) ×1
CONN 1/2X1/2X1/2 BEN (MISCELLANEOUS) ×3 IMPLANT
CONN 3/8X1/2 ST GISH (MISCELLANEOUS) ×8 IMPLANT
COUNTER NEEDLE 20 DBL MAG RED (NEEDLE) ×4 IMPLANT
COVER PROBE W GEL 5X96 (DRAPES) ×4 IMPLANT
COVER SURGICAL LIGHT HANDLE (MISCELLANEOUS) ×4 IMPLANT
COVER WAND RF STERILE (DRAPES) ×4 IMPLANT
CRADLE DONUT ADULT HEAD (MISCELLANEOUS) ×4 IMPLANT
DERMABOND ADHESIVE PROPEN (GAUZE/BANDAGES/DRESSINGS) ×1
DERMABOND ADVANCED .7 DNX6 (GAUZE/BANDAGES/DRESSINGS) ×3 IMPLANT
DEVICE ATRICLIP LAA PRCLPII 45 (Clip) ×3 IMPLANT
DEVICE SUT CK QUICK LOAD MINI (Prosthesis & Implant Heart) ×12 IMPLANT
DRAIN CHANNEL 32F RND 10.7 FF (WOUND CARE) ×16 IMPLANT
DRAPE BILATERAL SPLIT (DRAPES) IMPLANT
DRAPE CARDIOVASCULAR INCISE (DRAPES)
DRAPE CV SPLIT W-CLR ANES SCRN (DRAPES) IMPLANT
DRAPE INCISE IOBAN 66X45 STRL (DRAPES) ×12 IMPLANT
DRAPE SLUSH/WARMER DISC (DRAPES) ×8 IMPLANT
DRAPE SRG 135X102X78XABS (DRAPES) IMPLANT
DRSG AQUACEL AG ADV 3.5X14 (GAUZE/BANDAGES/DRESSINGS) ×4 IMPLANT
DRSG COVADERM 4X14 (GAUZE/BANDAGES/DRESSINGS) ×8 IMPLANT
ELECT REM PT RETURN 9FT ADLT (ELECTROSURGICAL) ×8
ELECTRODE REM PT RTRN 9FT ADLT (ELECTROSURGICAL) ×6 IMPLANT
FELT TEFLON 1X6 (MISCELLANEOUS) ×4 IMPLANT
GAUZE SPONGE 4X4 12PLY STRL (GAUZE/BANDAGES/DRESSINGS) ×8 IMPLANT
GAUZE SPONGE 4X4 12PLY STRL LF (GAUZE/BANDAGES/DRESSINGS) ×4 IMPLANT
GLOVE BIO SURGEON STRL SZ 6 (GLOVE) IMPLANT
GLOVE BIO SURGEON STRL SZ 6.5 (GLOVE) ×20 IMPLANT
GLOVE BIO SURGEON STRL SZ7 (GLOVE) IMPLANT
GLOVE BIO SURGEON STRL SZ7.5 (GLOVE) ×4 IMPLANT
GLOVE BIO SURGEON STRL SZ8.5 (GLOVE) ×4 IMPLANT
GLOVE BIOGEL PI IND STRL 7.5 (GLOVE) ×3 IMPLANT
GLOVE BIOGEL PI INDICATOR 7.5 (GLOVE) ×1
GLOVE ORTHO TXT STRL SZ7.5 (GLOVE) ×12 IMPLANT
GOWN STRL REUS W/ TWL LRG LVL3 (GOWN DISPOSABLE) ×24 IMPLANT
GOWN STRL REUS W/TWL LRG LVL3 (GOWN DISPOSABLE) ×8
HEMOSTAT POWDER SURGIFOAM 1G (HEMOSTASIS) ×8 IMPLANT
INSERT FOGARTY XLG (MISCELLANEOUS) ×4 IMPLANT
KIT BASIN OR (CUSTOM PROCEDURE TRAY) ×4 IMPLANT
KIT DILATOR VASC 18G NDL (KITS) ×4 IMPLANT
KIT DRAINAGE VACCUM ASSIST (KITS) ×4 IMPLANT
KIT SUCTION CATH 14FR (SUCTIONS) ×16 IMPLANT
KIT SUT CK MINI COMBO 4X17 (Prosthesis & Implant Heart) ×4 IMPLANT
KIT TURNOVER KIT B (KITS) ×8 IMPLANT
LEAD PACING MYOCARDI (MISCELLANEOUS) ×4 IMPLANT
LINE VENT (MISCELLANEOUS) ×4 IMPLANT
LOOP VESSEL SUPERMAXI WHITE (MISCELLANEOUS) ×8 IMPLANT
MARKER GRAFT CORONARY BYPASS (MISCELLANEOUS) IMPLANT
NS IRRIG 1000ML POUR BTL (IV SOLUTION) ×20 IMPLANT
PACK E OPEN HEART (SUTURE) ×4 IMPLANT
PACK OPEN HEART (CUSTOM PROCEDURE TRAY) ×4 IMPLANT
PAD ARMBOARD 7.5X6 YLW CONV (MISCELLANEOUS) ×8 IMPLANT
PROBE CRYO2-ABLATION MALLABLE (MISCELLANEOUS) ×4 IMPLANT
RING ANLPLS HAART 300X21 (Ring) ×3 IMPLANT
RING HAART ANNULOPLASTY 300-21 (Ring) ×1 IMPLANT
RING MITRAL MEMO 4D 30 (Prosthesis & Implant Heart) ×4 IMPLANT
SENSOR MYOCARDIAL TEMP (MISCELLANEOUS) ×4 IMPLANT
SET CARDIOPLEGIA MPS 5001102 (MISCELLANEOUS) ×4 IMPLANT
SET IRRIG TUBING LAPAROSCOPIC (IRRIGATION / IRRIGATOR) ×4 IMPLANT
SPONGE LAP 4X18 RFD (DISPOSABLE) ×4 IMPLANT
SUCKER INTRACARDIAC WEIGHTED (SUCKER) ×4 IMPLANT
SUT BONE WAX W31G (SUTURE) ×8 IMPLANT
SUT ETHIBON 2 0 V 52N 30 (SUTURE) ×8 IMPLANT
SUT ETHIBON EXCEL 2-0 V-5 (SUTURE) IMPLANT
SUT ETHIBOND (SUTURE) ×8 IMPLANT
SUT ETHIBOND 2 0 SH (SUTURE) ×6 IMPLANT
SUT ETHIBOND 2 0 SH 36X2 (SUTURE) ×6 IMPLANT
SUT ETHIBOND 2 0 V4 (SUTURE) IMPLANT
SUT ETHIBOND 2 0V4 GREEN (SUTURE) IMPLANT
SUT ETHIBOND 2-0 RB-1 WHT (SUTURE) ×8 IMPLANT
SUT ETHIBOND 4 0 RB 1 (SUTURE) IMPLANT
SUT ETHIBOND 4 0 TF (SUTURE) IMPLANT
SUT ETHIBOND 5 0 C 1 30 (SUTURE) ×4 IMPLANT
SUT ETHIBOND V-5 VALVE (SUTURE) IMPLANT
SUT ETHIBOND X763 2 0 SH 1 (SUTURE) ×8 IMPLANT
SUT MNCRL AB 3-0 PS2 18 (SUTURE) ×16 IMPLANT
SUT PDS AB 1 CTX 36 (SUTURE) ×16 IMPLANT
SUT PROLENE 3 0 SH 1 (SUTURE) ×4 IMPLANT
SUT PROLENE 3 0 SH DA (SUTURE) ×8 IMPLANT
SUT PROLENE 3 0 SH1 36 (SUTURE) ×4 IMPLANT
SUT PROLENE 4 0 RB 1 (SUTURE) ×27
SUT PROLENE 4 0 SH DA (SUTURE) ×16 IMPLANT
SUT PROLENE 4-0 RB1 .5 CRCL 36 (SUTURE) ×81 IMPLANT
SUT PROLENE 5 0 C 1 36 (SUTURE) IMPLANT
SUT PROLENE 6 0 C 1 30 (SUTURE) IMPLANT
SUT PROLENE POLY MONO (SUTURE) ×16 IMPLANT
SUT PTFE CHORD X 20MM (SUTURE) ×4 IMPLANT
SUT SILK  1 MH (SUTURE) ×4
SUT SILK 1 MH (SUTURE) ×12 IMPLANT
SUT SILK 2 0 SH CR/8 (SUTURE) IMPLANT
SUT SILK 3 0 SH CR/8 (SUTURE) IMPLANT
SUT STEEL 6MS V (SUTURE) IMPLANT
SUT STEEL STERNAL CCS#1 18IN (SUTURE) ×4 IMPLANT
SUT STEEL SZ 6 DBL 3X14 BALL (SUTURE) ×8 IMPLANT
SUT VIC AB 1 CTX 18 (SUTURE) ×4 IMPLANT
SUT VIC AB 1 CTX 36 (SUTURE) ×3
SUT VIC AB 1 CTX36XBRD ANBCTR (SUTURE) ×9 IMPLANT
SUT VIC AB 2-0 CTX 27 (SUTURE) IMPLANT
SYSTEM SAHARA CHEST DRAIN ATS (WOUND CARE) ×12 IMPLANT
TAPE CLOTH SURG 4X10 WHT LF (GAUZE/BANDAGES/DRESSINGS) ×4 IMPLANT
TAPE PAPER 2X10 WHT MICROPORE (GAUZE/BANDAGES/DRESSINGS) ×4 IMPLANT
TOWEL GREEN STERILE (TOWEL DISPOSABLE) ×8 IMPLANT
TOWEL GREEN STERILE FF (TOWEL DISPOSABLE) ×4 IMPLANT
TRAY FOLEY SLVR 14FR TEMP STAT (SET/KITS/TRAYS/PACK) IMPLANT
TRAY FOLEY SLVR 16FR TEMP STAT (SET/KITS/TRAYS/PACK) ×4 IMPLANT
TUBE SUCT INTRACARD DLP 20F (MISCELLANEOUS) ×4 IMPLANT
TUBING INSUFFLATION 10FT LAP (TUBING) IMPLANT
UNDERPAD 30X30 (UNDERPADS AND DIAPERS) ×4 IMPLANT
VENT LEFT HEART 12002 (CATHETERS) ×4
WATER STERILE IRR 1000ML POUR (IV SOLUTION) ×8 IMPLANT

## 2018-05-18 NOTE — Transfer of Care (Signed)
Immediate Anesthesia Transfer of Care Note  Patient: [name]  Procedure(s) Performed: AORTIC VALVE REPAIR using HAART 300 Aortic Annuloplasty Device size 30mm (N/A Chest) MITRAL VALVE REPAIR (MVR) using 4D Memo Ring size 30 (N/A Chest) MAZE (N/A ) TRANSESOPHAGEAL ECHOCARDIOGRAM (TEE) (N/A ) CLIPPING OF ATRIAL APPENDAGE using AtriCure Clip size 45 (Chest)  Patient Location: ICU  Anesthesia Type:General  Level of Consciousness: Patient remains intubated per anesthesia plan  Airway & Oxygen Therapy: Patient remains intubated per anesthesia plan and Patient placed on Ventilator (see vital sign flow sheet for setting)  Post-op Assessment: Report given to RN and Post -op Vital signs reviewed and stable  Post vital signs: Reviewed and stable  Last Vitals:  Vitals Value Taken Time  BP 102/72 05/18/2018  4:00 PM  Temp 36.9 C 05/18/2018  4:10 PM  Pulse 89 05/18/2018  4:10 PM  Resp 12 05/18/2018  4:10 PM  SpO2 100 % 05/18/2018  4:10 PM  Vitals shown include unvalidated device data.  Last Pain:  Vitals:   05/18/18 0602  PainSc: 0-No pain         Complications: No apparent anesthesia complications

## 2018-05-18 NOTE — Interval H&P Note (Signed)
History and Physical Interval Note:  05/18/2018 6:27 AM  [name]  has presented today for surgery, with the diagnosis of AI MR AFIB  The various methods of treatment have been discussed with the patient and family. After consideration of risks, benefits and other options for treatment, the patient has consented to  Procedure(s) with comments: AORTIC VALVE REPAIR OR REPLACEMENT (N/A) MITRAL VALVE REPAIR (MVR) (N/A) - GLUTARALDEHYDE MAZE (N/A) TRANSESOPHAGEAL ECHOCARDIOGRAM (TEE) (N/A) as a surgical intervention .  The patient's history has been reviewed, patient examined, no change in status, stable for surgery.  I have reviewed the patient's chart and labs.  Questions were answered to the patient's satisfaction.     Rexene Alberts

## 2018-05-18 NOTE — Anesthesia Procedure Notes (Signed)
Central Venous Catheter Insertion Performed by: Roderic Palau, MD, anesthesiologist Start/End11/11/2017 7:05 AM, 05/18/2018 7:20 AM Patient location: Pre-op. Preanesthetic checklist: patient identified, IV checked, site marked, risks and benefits discussed, surgical consent, monitors and equipment checked, pre-op evaluation, timeout performed and anesthesia consent Position: Trendelenburg Lidocaine 1% used for infiltration and patient sedated Hand hygiene performed , maximum sterile barriers used  and Seldinger technique used Catheter size: 9 Fr Total catheter length 10. Central line was placed.MAC introducer Swan type:thermodilution Procedure performed using ultrasound guided technique. Ultrasound Notes:anatomy identified, needle tip was noted to be adjacent to the nerve/plexus identified, no ultrasound evidence of intravascular and/or intraneural injection and image(s) printed for medical record Attempts: 1 Following insertion, line sutured, dressing applied and Biopatch. Post procedure assessment: blood return through all ports, free fluid flow and no air  Patient tolerated the procedure well with no immediate complications.

## 2018-05-18 NOTE — Anesthesia Procedure Notes (Signed)
Central Venous Catheter Insertion Performed by: Roderic Palau, MD, anesthesiologist Start/End11/11/2017 7:05 AM, 05/18/2018 7:20 AM Patient location: Pre-op. Preanesthetic checklist: patient identified, IV checked, site marked, risks and benefits discussed, surgical consent, monitors and equipment checked, pre-op evaluation, timeout performed and anesthesia consent Hand hygiene performed  and maximum sterile barriers used  PA cath was placed.Swan type:thermodilution PA Cath depth:50 Procedure performed without using ultrasound guided technique. Attempts: 1 Patient tolerated the procedure well with no immediate complications.

## 2018-05-18 NOTE — Progress Notes (Signed)
RT NOTES: Received patient from OR on vent settings PRVC 750/12/+10/100%. RT transporting patient from OR stated there was trouble ventilating patient and with the patient's oxygenation. Patient remains on those settings at this time. Sats 94%.

## 2018-05-18 NOTE — Anesthesia Procedure Notes (Signed)
Procedure Name: Intubation Date/Time: 05/18/2018 8:03 AM Performed by: Roderic Palau, MD Pre-anesthesia Checklist: Patient identified, Emergency Drugs available, Suction available, Patient being monitored and Timeout performed Patient Re-evaluated:Patient Re-evaluated prior to induction Oxygen Delivery Method: Circle system utilized Preoxygenation: Pre-oxygenation with 100% oxygen Induction Type: IV induction Ventilation: Two handed mask ventilation required and Oral airway inserted - appropriate to patient size Laryngoscope Size: Glidescope and 4 Grade View: Grade I Tube type: Subglottic suction tube Tube size: 8.0 mm Number of attempts: 2 Airway Equipment and Method: Stylet and Video-laryngoscopy Placement Confirmation: ETT inserted through vocal cords under direct vision Secured at: 23 cm Tube secured with: Tape Dental Injury: Teeth and Oropharynx as per pre-operative assessment  Comments: DVL x1 MAC 4 with grade IV view. Glidescope with MAC 4, Grade I view and intubated w/o difficulty

## 2018-05-18 NOTE — Op Note (Signed)
CARDIOTHORACIC SURGERY OPERATIVE NOTE  Date of Procedure:  05/18/2018  Preoperative Diagnosis:   Moderate Aortic Insufficiency  Severe Mitral Regurgitation  Recurrent Persistent Atrial Fibrillation  Postoperative Diagnosis: Same  Procedure:   Aortic Valve Repair  Complex valvuloplasty including suture plication of prolapsing right coronary leaflet  Biostable HAART aortic ring annuloplasty (size 24mm, ref #[id number], lot #[id number])   Mitral Valve Repair  Complex valvuloplasty including artificial Gore-tex neochord placement x4  Sorin Memo 4D ring annuloplasty (size 20mm, model #4DM-30, serial #[id number])   Maze Procedure   complete bilateral atrial lesion set using bipolar radiofrequency and cryothermy ablation  clipping of left atrial appendage (Atricure Pro245 left atrial clip size 45 mm)    Surgeon: Valentina Gu. Roxy Manns, MD  Assistant: [name], PA-C  Anesthesia: Roderic Palau, MD  Operative Findings:  Prolapse of right coronary leaflet of aortic valve with type II dysfunction causing moderate-severe aortic insufficiency  Type I and type II mitral valve dysfunction with annular dilatation and prolapse of middle scallop of anterior leaflet causing severe mitral regurgitation  Mild left ventricular systolic dysfunction  No residual aortic insufficiency after successful valve repair  No residual mitral regurgitation after successful valve repair                    BRIEF CLINICAL NOTE AND INDICATIONS FOR SURGERY  Patient is a 67 year old obese male with history of chronic atrial fibrillation that is now persistent,hypertension,chronic diastolic congestive heart failure, and obstructive sleep apnea who has been referred for surgical consultation to discuss treatment options for management of persistent atrial fibrillation.  The patient states that he was first diagnosed with atrial fibrillation approximately 5 years ago. He was initially  evaluated by Dr. Alvester Chou and eventually referred to Dr. Rayann Heman for management. He was managed with Tikosyn successfully for approximately 2 years, but he then began to develop recurrent episodes of paroxysmal atrial fibrillation that increased in frequency and duration. He eventually developed persistent atrial fibrillation and underwent cardioversion on 2 occasions in 2014 and 2015.Nuclear stress test performed April 2018 revealed moderate left ventricular chamber enlargement with no evidence for myocardial ischemia.Echocardiogram performed in April 2018 revealed normal left ventricular systolic function with moderate left ventricular hypertrophy. There was mild aortic insufficiency, mild mitral regurgitation, and severe left atrial enlargement reported. He has remained in persistent atrial fibrillation since last fall without any spontaneous recurrence to sinus rhythm. He was seen in follow-up recently by Dr. Rayann Heman and long-term treatment options were discussed. Catheter based ablation was discussed as an alternative but felt likely to be associated with diminished long-term success because of severe left atrial enlargement noted on echocardiogram in the setting of obesity with obstructive sleep apnea. Cardiothoracic surgical consultation was requested to discuss surgical options including the Maze procedure.  After his initial consultation he underwent transesophageal echocardiogram and diagnostic cardiac catheterization to rule out the presence of significant structural heart disease and/or coronary artery disease. TEE performed April 21, 2018 by Dr. Marlou Porch revealed moderate to severe aortic insufficiency and moderate to severe mitral regurgitation. There was left atrium chamber enlargement. There was no thrombus in the left atrial appendage. LV systolic function was moderately reduced with ejection fraction estimated 35 to 40%. There were no significant wall motion abnormalities. Left and right  heart catheterization performed by Dr. and revealed no significant coronary artery disease but confirmed the presence of moderate pulmonary hypertension and moderately elevated left heart filling pressures with large V waves on wedge tracing consistent with severe mitral  regurgitation.   The patient has been seen in consultation and counseled at length regarding the indications, risks and potential benefits of surgery.  All questions have been answered, and the patient provides full informed consent for the operation as described.    DETAILS OF THE OPERATIVE PROCEDURE  Preparation:  The patient is brought to the operating room on the above mentioned date and central monitoring was established by the anesthesia team including placement of Swan-Ganz catheter and radial arterial line. The patient is placed in the supine position on the operating table.  Intravenous antibiotics are administered. General endotracheal anesthesia is induced uneventfully. A Foley catheter is placed.  Baseline transesophageal echocardiogram was performed.  Findings were notable for moderate left ventricular systolic dysfunction with ejection fraction estimated 50%.  There was moderate to severe aortic insufficiency.  The aortic valve is trileaflet.  The jet of aortic insufficiency was eccentric and directed along the ventricular surface of the anterior leaflet of the mitral valve.  There was severe mitral regurgitation.  The jet of regurgitation with central.  The aortic annulus was dilated.  The left atrium was dilated.  The patient's chest, abdomen, both groins, and both lower extremities are prepared and draped in a sterile manner. A time out procedure is performed.   Surgical Approach:  A median sternotomy incision was performed and the pericardium is opened. The ascending aorta is normal in appearance.    Extracorporeal Cardiopulmonary Bypass and Myocardial Protection:  The right common femoral vein is  cannulated using the Seldinger technique and a guidewire advanced into the right atrium using TEE guidance.  The patient is heparinized systemically and the femoral vein cannulated using a 22 Fr long femoral venous cannula.  The ascending aorta is cannulated for cardiopulmonary bypass using a 20 French femoral arterial cannula placed using Seldinger technique.  Adequate heparinization is verified.   A retrograde cardioplegia cannula is placed through the right atrium into the coronary sinus.  The entire pre-bypass portion of the operation was notable for stable hemodynamics.  Cardiopulmonary bypass was begun and the surface of the heart is inspected.  A second venous cannula is placed directly into the superior vena cava.   A cardioplegia cannula is placed in the ascending aorta.  A temperature probe was placed in the interventricular septum.  The patient is cooled to 28C systemic temperature.  The aortic cross clamp is applied and cardioplegia is delivered initially in an antegrade fashion through the aortic root using modified [place] cold blood cardioplegia (Kennestone blood cardioplegia protocol).   Approximately 500 mL is administered antegrade and then the remainder of the resting dose is administered retrograde through the coronary sinus catheter.  The initial cardioplegic arrest is rapid with early diastolic arrest.  Repeat doses of cardioplegia are administered at 90 minutes and every 30 minutes thereafter through the coronary sinus catheter in order to maintain completely flat electrocardiogram.  Myocardial protection was felt to be excellent.   Maze Procedure (left atrial lesion set):  The AtriCure Synergy bipolar radiofrequency ablation clamp is used for all radiofrequency ablation lesions for the maze procedure.  The Atricure CryoICE nitrous oxide cryothermy system is utilized for all cryothermy ablation lesions.   The heart is retracted towards the surgeon's side and the left sided  pulmonary veins exposed.  An elliptical ablation lesion is created around the base of the left sided pulmonary veins.  A similar elliptical lesion was created around the base of the left atrial appendage.  The left atrial appendage  was obliterated using an left atrial appendage clip (Atricure Pro245 left atrial clip size 45 mm).  The heart was replaced into the pericardial sac.  An elliptical ablation lesion is created around the base of the right sided pulmonary veins.    A left atriotomy incision was performed through the interatrial groove and extended partially across the back wall of the left atrium after opening the oblique sinus inferiorly.  The floor of the left atrium and the mitral valve were exposed using a self-retaining retractor.  The mitral valve was inspected.  A bipolar ablation lesion was placed across the dome of the left atrium from the cephalad apex of the atriotomy incision to reach the cephalad apex of the elliptical lesion around the left sided pulmonary veins.  A similar bipolar lesion was placed across the back wall of the left atrium from the caudad apex of the atriotomy incision to reach the caudad apex of the elliptical lesion around the left sided pulmonary veins, thereby completing a box.  Finally another bipolar lesion was placed across the back wall of the left atrium from the caudad apex of the atriotomy incision towards the posterior mitral valve annulus.  This lesion was completed along the endocardial surface onto the posterior mitral annulus with a 3 minute duration cryothermy lesion, followed by a second cryothermy lesion along the posterior epicardial surface of the left atrium across the coronary sinus with the probe extending up the epicardial surface of the lateral wall of the right atrium. This completes the entire left side lesion set of the Cox maze procedure.   Mitral Valve Repair:  The mitral valve is exposed using a self-retaining retractor.  The mitral  valve was inspected and notable for fibroelastic deficiency type degenerative disease with elongation of several of the primary chordae tendinae to the middle scallop of the anterior leaflet.  There were no ruptured chordae tendinae.  There was annular dilatation.  Interrupted 2-0 Ethibond horizontal mattress sutures are placed circumferentially around the entire mitral valve annulus. The sutures will ultimately be utilized for ring annuloplasty, and at this juncture there are utilized to suspend the valve symmetrically.  The prolapsing middle scallop of the anterior leaflet was repaired using artificial Gore-Tex neochords.  Neochord placement was performed using Chord-X multi-strand CV-4 Goretex pre-measured loops.  The appropriate cord length was measured from corresponding normal length primary cords from the A1 segment of the anterior leaflet. The papillary muscle suture of the Chord-X multi-strand suture was placed through the head of the anterior papillary muscle in a horizontal mattress fashion and tied over Teflon felt pledgets. Two of the three pre-measured loops were then reimplanted into the free margin of the A2 segment of the anterior leaflet.  The final pair was discarded.  The valve was tested with saline and appeared competent even without ring annuloplasty complete. The valve was sized to a 30 mm annuloplasty ring, based upon the transverse distance between the left and right commissures and the height of the anterior leaflet, corresponding to a size just slightly larger than the overall surface area of the anterior leaflet.  A Sorin Memo 4D annuloplasty ring (size 30 mm, catalog #4DM-30, serial #[id number]) was secured in place uneventfully.  All ring sutures were secured using a Cor-knot device.    The valve was tested with saline and appeared competent.  There is no residual leak.  There was a broad, symmetrical line of coaptation of the anterior and posterior leaflet which was confirmed  using the blue  ink test.     Aortic Valve Repair:  The aorta was dissected away from the pulmonary artery and the proximal aortic root exposed.  A transverse aortotomy incision was made approximately 1 cm above the origin of the right coronary artery. The aortic valve was inspected. The aortic valve was trileaflet.  All 3 leaflets were relatively thin with normal mobility.  There was a fracture line across the right coronary leaflet with mild prolapse of the right coronary leaflet.  Traction sutures are placed just above each of the 3 commissures and spread laterally to suspend the valve symmetrically. The valve is again carefully inspected. The aortic annulus is sized with a Hegar dilator and found to be approximately 24 mm, consistent with the preoperative echo.  Each of the valve leaflets are sized using specially developed sizers for the Biostable annuloplasty ring to measure the length of the free margin of each leaflet.   A decision is made to proceed with a 21 mm annuloplasty ring.  Biostable HAART 300 aortic ring annuloplasty (size 21 mm, ref # 300-21US, lot # [id number]) was implanted per manufacturer recommendations. Initially pledgeted Cabrol type pledgeted 4-0 Prolene sutures are placed beneath each of the commissures and through the posts of the ring. The the ring is subsequently lowered through the valve into the sub-annular space. A total 6 additional sutures are then placed at the base of each of the 3 sinuses of Valsalva to secure the ring beneath the valve. All 9 sutures are individually tied and lateral fixation is utilized to pull each of the sutures away from the valve leaflets.  The valve is again carefully annualized and appears competent. The prolapse is corrected using a pair of 7-0 Prolene plication sutures on either side of the Nodulus of [place].  Rewarming was begun.  The aortotomy was closed  using a 2-layer closure of running 4-0 Prolene suture.  One final dose of warm  retrograde "reanimation" cardioplegia was administered retrograde through the coronary sinus catheter while all air was evacuated through the aortic root.  The aortic cross clamp was removed after a cross clamp time of 185 minutes.   Maze Procedure (right atrial lesion set):  The retrograde cardioplegia cannula was removed and the small hole in the right atrium extended a short distance.  The AtriCure Synergy bipolar radiofrequency ablation clamp is utilized to create a series of linear lesions in the right atrium, each with one limb of the clamp along the endocardial surface and the other along the epicardial surface. The first lesion is placed from the posterior apex of the atriotomy incision and along the lateral wall of the right atrium to reach the lateral aspect of the superior vena cava, connecting with the cryothermy lesion placed previously across the coronary sinus and up the epicardial surface of the lateral right atrium.  A second lesion is placed in the opposite direction from the posterior apex of the atriotomy incision along the lateral wall to reach the lateral aspect of the inferior vena cava. A third lesion is placed from the midportion of the atriotomy incision extending at a right angle to reach the tip of the right atrial appendage. A fourth lesion is placed from the anterior apex of the atriotomy incision in an anterior and inferior direction to reach the acute margin of the heart. Finally, the cryotherapy probe is utilized to complete the right atrial lesion set by placing the probe along the endocardial surface of the right atrium from the anterior apex of  the atriotomy incision to reach the tricuspid annulus at the 2:00 position. The right atriotomy incision is closed with a 2 layer closure of running 4-0 Prolene suture.   Procedure Completion:  Epicardial pacing wires are fixed to the right ventricular outflow tract and to the right atrial appendage. The patient is rewarmed to  37C temperature.  Prior to removal of any of the cannulas, the patient was temporarily weaned from cardiopulmonary bypass and valve function carefully examined using transesophageal echocardiogram.  There did not appear to be any residual aortic insufficiency or mitral regurgitation.  The aortic and left ventricular vents are removed.  The superior vena cava cannula is removed.  The patient is weaned and disconnected from cardiopulmonary bypass.  The patient's rhythm at separation from bypass was AV paced.  The patient was weaned from cardioplegic bypass  without any inotropic support. Total cardiopulmonary bypass time for the operation was 225 minutes.  Followup transesophageal echocardiogram performed after separation from bypass revealed normal functioning aortic and mitral valves with no residual aortic insufficiency and no residual mitral regurgitation.  Mean transvalvular gradient across the mitral valve was estimated 3 mmHg.  Mean transvalvular gradient across the aortic valve was estimated 4 mmHg.  Left ventricular function was unchanged from preoperatively.  The aortic cannula was removed uneventfully.  Protamine was administered to reverse the anticoagulation. The femoral venous cannula was removed and manual pressure held on the groin for 30 minutes.  The mediastinum and pleural space were inspected for hemostasis and irrigated with saline solution. The mediastinum and both pleural spaces were drained using 4 chest tubes placed through separate stab incisions inferiorly.  The soft tissues anterior to the aorta were reapproximated loosely. The sternum is closed with double strength sternal wire. The soft tissues anterior to the sternum were closed in multiple layers and the skin is closed with a running subcuticular skin closure.   The post-bypass portion of the operation was notable for stable rhythm and hemodynamics.   No blood products were administered during the operation.   Patient  Disposition:  The patient tolerated the procedure well and is transported to the surgical intensive care in stable condition. There are no intraoperative complications. All sponge instrument and needle counts are verified correct at completion of the operation.     Valentina Gu. Roxy Manns MD 05/18/2018 3:03 PM

## 2018-05-18 NOTE — Anesthesia Postprocedure Evaluation (Signed)
Anesthesia Post Note  Patient: AMANDEEP NESMITH  Procedure(s) Performed: AORTIC VALVE REPAIR using HAART 300 Aortic Annuloplasty Device size 1mm (N/A Chest) MITRAL VALVE REPAIR (MVR) using 4D Memo Ring size 30 (N/A Chest) MAZE (N/A ) TRANSESOPHAGEAL ECHOCARDIOGRAM (TEE) (N/A ) CLIPPING OF ATRIAL APPENDAGE using AtriCure Clip size 45 (Chest)     Patient location during evaluation: SICU Anesthesia Type: General Level of consciousness: sedated Pain management: pain level controlled Vital Signs Assessment: post-procedure vital signs reviewed and stable Respiratory status: patient remains intubated per anesthesia plan Cardiovascular status: stable Postop Assessment: no apparent nausea or vomiting Anesthetic complications: no    Last Vitals:  Vitals:   05/18/18 0614 05/18/18 1545  BP: (!) 141/69 110/61  Pulse:  90  Resp:  12  Temp:    SpO2:  94%    Last Pain:  Vitals:   05/18/18 0602  PainSc: 0-No pain                 Kayna Suppa,W. EDMOND

## 2018-05-18 NOTE — Progress Notes (Signed)
Patient ID: [name], male   DOB: [birth year], 67 y.o.   MRN: [id number]  TCTS Evening Rounds:   Hemodynamically stable  CI = 2 on Milrinone 0.25  Has started to wake up on vent.   Urine output good  CT output low  CBC    Component Value Date/Time   WBC 19.6 (H) 05/18/2018 1555   RBC 4.21 (L) 05/18/2018 1555   HGB 11.5 (L) 05/18/2018 1555   HGB 14.8 04/29/2018 1209   HCT 36.6 (L) 05/18/2018 1555   HCT 44.0 04/29/2018 1209   PLT 162 05/18/2018 1555   PLT 161 04/29/2018 1209   MCV 86.9 05/18/2018 1555   MCV 83 04/29/2018 1209   MCH 27.3 05/18/2018 1555   MCHC 31.4 05/18/2018 1555   RDW 15.9 (H) 05/18/2018 1555   RDW 13.9 04/29/2018 1209   LYMPHSABS 1.9 10/27/2016 0947   MONOABS 0.6 03/25/2009 2346   EOSABS 0.3 10/27/2016 0947   BASOSABS 0.0 10/27/2016 0947     BMET    Component Value Date/Time   NA 138 05/18/2018 1357   NA 142 04/29/2018 1209   K 4.5 05/18/2018 1357   CL 107 05/18/2018 1357   CO2 19 (L) 05/14/2018 1430   GLUCOSE 128 (H) 05/18/2018 1357   BUN 16 05/18/2018 1357   BUN 17 04/29/2018 1209   CREATININE 0.90 05/18/2018 1357   CREATININE 0.89 06/03/2013 1157   CALCIUM 8.8 (L) 05/14/2018 1430   GFRNONAA >60 05/14/2018 1430   GFRAA >60 05/14/2018 1430     A/P:  Stable postop course. Continue current plans. Wean vent as tolerated.

## 2018-05-18 NOTE — OR Nursing (Signed)
14:10 - 45 minute call to SICU charge nurse

## 2018-05-18 NOTE — Brief Op Note (Addendum)
05/18/2018  2:22 PM  PATIENT:  [name]  67 y.o. male  PRE-OPERATIVE DIAGNOSIS:  AI MR AFIB  POST-OPERATIVE DIAGNOSIS:  AI MR AFIB  PROCEDURE:  Procedure(s): AORTIC VALVE REPAIR using HAART 300 Aortic Annuloplasty Device size 61mm (N/A) MITRAL VALVE REPAIR (MVR) using 4D Memo Ring size 30 (N/A) MAZE (N/A) TRANSESOPHAGEAL ECHOCARDIOGRAM (TEE) (N/A) CLIPPING OF ATRIAL APPENDAGE using AtriCure Clip size 45  SURGEON:  Surgeon(s) and Role:    * Rexene Alberts, MD - Primary  PHYSICIAN ASSISTANT: WAYNE GOLD PA-C  ANESTHESIA:   general  EBL:  500 mL   BLOOD ADMINISTERED:none  DRAINS: ROUTINE PLEURAL AND PERICARDIAL CHEST TUBES   LOCAL MEDICATIONS USED:  NONE  SPECIMEN:  No Specimen  DISPOSITION OF SPECIMEN:  N/A  COUNTS:  YES  TOURNIQUET:  * No tourniquets in log *  DICTATION: .Dragon Dictation  PLAN OF CARE: Admit to inpatient   PATIENT DISPOSITION:  ICU - intubated and hemodynamically stable.   Delay start of Pharmacological VTE agent (>24hrs) due to surgical blood loss or risk of bleeding: yes  COMPLICATIONS: NO KNOWN

## 2018-05-18 NOTE — OR Nursing (Signed)
14:40 - 20 minute call to SICu nurse

## 2018-05-19 ENCOUNTER — Encounter (HOSPITAL_COMMUNITY): Payer: Self-pay | Admitting: Thoracic Surgery (Cardiothoracic Vascular Surgery)

## 2018-05-19 ENCOUNTER — Inpatient Hospital Stay (HOSPITAL_COMMUNITY): Payer: Medicare HMO

## 2018-05-19 LAB — POCT I-STAT, CHEM 8
BUN: 19 mg/dL (ref 8–23)
CALCIUM ION: 1.13 mmol/L — AB (ref 1.15–1.40)
Chloride: 105 mmol/L (ref 98–111)
Creatinine, Ser: 1.1 mg/dL (ref 0.61–1.24)
Glucose, Bld: 148 mg/dL — ABNORMAL HIGH (ref 70–99)
HEMATOCRIT: 29 % — AB (ref 39.0–52.0)
HEMOGLOBIN: 9.9 g/dL — AB (ref 13.0–17.0)
Potassium: 4.1 mmol/L (ref 3.5–5.1)
SODIUM: 137 mmol/L (ref 135–145)
TCO2: 24 mmol/L (ref 22–32)

## 2018-05-19 LAB — POCT I-STAT 3, ART BLOOD GAS (G3+)
Acid-base deficit: 4 mmol/L — ABNORMAL HIGH (ref 0.0–2.0)
Acid-base deficit: 5 mmol/L — ABNORMAL HIGH (ref 0.0–2.0)
Acid-base deficit: 4 mmol/L — ABNORMAL HIGH (ref 0.0–2.0)
Bicarbonate: 19 mmol/L — ABNORMAL LOW (ref 20.0–28.0)
Bicarbonate: 19.9 mmol/L — ABNORMAL LOW (ref 20.0–28.0)
Bicarbonate: 23 mmol/L (ref 20.0–28.0)
O2 SAT: 91 %
O2 Saturation: 94 %
O2 Saturation: 91 %
PCO2 ART: 49.1 mmHg — AB (ref 32.0–48.0)
PH ART: 7.278 — AB (ref 7.350–7.450)
PH ART: 7.372 (ref 7.350–7.450)
PH ART: 7.381 (ref 7.350–7.450)
PO2 ART: 63 mmHg — AB (ref 83.0–108.0)
Patient temperature: 37.4
TCO2: 20 mmol/L — AB (ref 22–32)
TCO2: 21 mmol/L — ABNORMAL LOW (ref 22–32)
TCO2: 24 mmol/L (ref 22–32)
pCO2 arterial: 32.9 mmHg (ref 32.0–48.0)
pCO2 arterial: 33.8 mmHg (ref 32.0–48.0)
pO2, Arterial: 75 mmHg — ABNORMAL LOW (ref 83.0–108.0)
pO2, Arterial: 69 mmHg — ABNORMAL LOW (ref 83.0–108.0)

## 2018-05-19 LAB — CBC
HCT: 34.8 % — ABNORMAL LOW (ref 39.0–52.0)
HCT: 33.6 % — ABNORMAL LOW (ref 39.0–52.0)
Hemoglobin: 11.3 g/dL — ABNORMAL LOW (ref 13.0–17.0)
Hemoglobin: 10.5 g/dL — ABNORMAL LOW (ref 13.0–17.0)
MCH: 27.8 pg (ref 26.0–34.0)
MCH: 27.3 pg (ref 26.0–34.0)
MCHC: 32.5 g/dL (ref 30.0–36.0)
MCHC: 31.3 g/dL (ref 30.0–36.0)
MCV: 85.5 fL (ref 80.0–100.0)
MCV: 87.5 fL (ref 80.0–100.0)
NRBC: 0 % (ref 0.0–0.2)
NRBC: 0 % (ref 0.0–0.2)
PLATELETS: 140 10*3/uL — AB (ref 150–400)
Platelets: 141 10*3/uL — ABNORMAL LOW (ref 150–400)
RBC: 4.07 MIL/uL — ABNORMAL LOW (ref 4.22–5.81)
RBC: 3.84 MIL/uL — ABNORMAL LOW (ref 4.22–5.81)
RDW: 16 % — ABNORMAL HIGH (ref 11.5–15.5)
RDW: 16.3 % — AB (ref 11.5–15.5)
WBC: 16.1 10*3/uL — ABNORMAL HIGH (ref 4.0–10.5)
WBC: 14.9 10*3/uL — ABNORMAL HIGH (ref 4.0–10.5)

## 2018-05-19 LAB — GLUCOSE, CAPILLARY
GLUCOSE-CAPILLARY: 132 mg/dL — AB (ref 70–99)
GLUCOSE-CAPILLARY: 114 mg/dL — AB (ref 70–99)
GLUCOSE-CAPILLARY: 142 mg/dL — AB (ref 70–99)
GLUCOSE-CAPILLARY: 126 mg/dL — AB (ref 70–99)
GLUCOSE-CAPILLARY: 152 mg/dL — AB (ref 70–99)
Glucose-Capillary: 117 mg/dL — ABNORMAL HIGH (ref 70–99)
Glucose-Capillary: 119 mg/dL — ABNORMAL HIGH (ref 70–99)
Glucose-Capillary: 121 mg/dL — ABNORMAL HIGH (ref 70–99)
Glucose-Capillary: 129 mg/dL — ABNORMAL HIGH (ref 70–99)
Glucose-Capillary: 137 mg/dL — ABNORMAL HIGH (ref 70–99)
Glucose-Capillary: 138 mg/dL — ABNORMAL HIGH (ref 70–99)
Glucose-Capillary: 154 mg/dL — ABNORMAL HIGH (ref 70–99)

## 2018-05-19 LAB — COOXEMETRY PANEL
CARBOXYHEMOGLOBIN: 1.2 % (ref 0.5–1.5)
CARBOXYHEMOGLOBIN: 0.9 % (ref 0.5–1.5)
METHEMOGLOBIN: 1 % (ref 0.0–1.5)
METHEMOGLOBIN: 1.6 % — AB (ref 0.0–1.5)
O2 Saturation: 38.5 %
O2 Saturation: 51.7 %
TOTAL HEMOGLOBIN: 12.2 g/dL (ref 12.0–16.0)
Total hemoglobin: 11.3 g/dL — ABNORMAL LOW (ref 12.0–16.0)

## 2018-05-19 LAB — BASIC METABOLIC PANEL
ANION GAP: 3 — AB (ref 5–15)
BUN: 17 mg/dL (ref 8–23)
CO2: 22 mmol/L (ref 22–32)
Calcium: 7.5 mg/dL — ABNORMAL LOW (ref 8.9–10.3)
Chloride: 113 mmol/L — ABNORMAL HIGH (ref 98–111)
Creatinine, Ser: 1.3 mg/dL — ABNORMAL HIGH (ref 0.61–1.24)
GFR, EST NON AFRICAN AMERICAN: 56 mL/min — AB (ref >60)
Glucose, Bld: 132 mg/dL — ABNORMAL HIGH (ref 70–99)
Potassium: 4.4 mmol/L (ref 3.5–5.1)
SODIUM: 138 mmol/L (ref 135–145)

## 2018-05-19 LAB — MAGNESIUM
MAGNESIUM: 2.8 mg/dL — AB (ref 1.7–2.4)
MAGNESIUM: 2.5 mg/dL — AB (ref 1.7–2.4)

## 2018-05-19 LAB — CREATININE, SERUM
Creatinine, Ser: 1.16 mg/dL (ref 0.61–1.24)
GFR calc non Af Amer: >60 mL/min (ref >60)

## 2018-05-19 MED ORDER — INSULIN ASPART 100 UNIT/ML ~~LOC~~ SOLN
0.0000 [IU] | SUBCUTANEOUS | Status: DC
  Administered 2018-05-19 – 2018-05-20 (×5): 2 [IU] via SUBCUTANEOUS

## 2018-05-19 MED ORDER — LEVALBUTEROL HCL 1.25 MG/0.5ML IN NEBU
1.2500 mg | INHALATION_SOLUTION | Freq: Four times a day (QID) | RESPIRATORY_TRACT | Status: DC | PRN
  Administered 2018-05-19: 1.25 mg via RESPIRATORY_TRACT
  Filled 2018-05-19: qty 0.5

## 2018-05-19 MED ORDER — WARFARIN SODIUM 2.5 MG PO TABS
2.5000 mg | ORAL_TABLET | Freq: Every day | ORAL | Status: DC
  Administered 2018-05-19 – 2018-05-20 (×2): 2.5 mg via ORAL
  Filled 2018-05-19 (×2): qty 1

## 2018-05-19 MED ORDER — OXYCODONE HCL 5 MG PO TABS
10.0000 mg | ORAL_TABLET | ORAL | Status: DC | PRN
  Administered 2018-05-19 – 2018-05-20 (×4): 10 mg via ORAL
  Filled 2018-05-19 (×5): qty 2

## 2018-05-19 MED ORDER — ENOXAPARIN SODIUM 30 MG/0.3ML ~~LOC~~ SOLN
30.0000 mg | SUBCUTANEOUS | Status: DC
  Administered 2018-05-20 – 2018-05-23 (×4): 30 mg via SUBCUTANEOUS
  Filled 2018-05-19 (×5): qty 0.3

## 2018-05-19 MED ORDER — INSULIN ASPART 100 UNIT/ML ~~LOC~~ SOLN: 0.0000 [IU] | SUBCUTANEOUS | Status: DC

## 2018-05-19 MED ORDER — ORAL CARE MOUTH RINSE
15.0000 mL | Freq: Two times a day (BID) | OROMUCOSAL | Status: DC
  Administered 2018-05-21 (×2): 15 mL via OROMUCOSAL

## 2018-05-19 MED ORDER — WARFARIN - PHYSICIAN DOSING INPATIENT
Freq: Every day | Status: DC
  Administered 2018-05-19: 18:00:00

## 2018-05-19 MED ORDER — FUROSEMIDE 10 MG/ML IJ SOLN
10.0000 mg/h | INTRAVENOUS | Status: DC
  Administered 2018-05-19 – 2018-05-20 (×2): 10 mg/h via INTRAVENOUS
  Filled 2018-05-19: qty 25
  Filled 2018-05-19: qty 5

## 2018-05-19 MED FILL — Mannitol IV Soln 20%: INTRAVENOUS | Qty: 1000 | Status: AC

## 2018-05-19 MED FILL — Heparin Sodium (Porcine) Inj 1000 Unit/ML: INTRAMUSCULAR | Qty: 20 | Status: AC

## 2018-05-19 MED FILL — Electrolyte-R (PH 7.4) Solution: INTRAVENOUS | Qty: 4000 | Status: AC

## 2018-05-19 MED FILL — Sodium Bicarbonate IV Soln 8.4%: INTRAVENOUS | Qty: 50 | Status: AC

## 2018-05-19 MED FILL — Sodium Chloride IV Soln 0.9%: INTRAVENOUS | Qty: 2000 | Status: AC

## 2018-05-19 MED FILL — Calcium Chloride Inj 10%: INTRAVENOUS | Qty: 10 | Status: AC

## 2018-05-19 MED FILL — Lidocaine HCl(Cardiac) IV PF Soln Pref Syr 100 MG/5ML (2%): INTRAVENOUS | Qty: 25 | Status: AC

## 2018-05-19 NOTE — Procedures (Signed)
Extubation Procedure Note  Patient Details:   Name: [name] DOB: 1950-09-21 MRN: [id number]   Airway Documentation:    Vent end date: 05/19/18 Vent end time: 0025   Evaluation  O2 sats: stable throughout Complications: No apparent complications Patient did tolerate procedure well. Bilateral Breath Sounds: Clear, Diminished   Patient extubated to 5L [place]. NIF -36  VC 1.4L  Cuff leak noted prior to extubation. Patient able to lift head off pillow for 5 seconds.   Patient able to vocalize post extubation. Good effort on IS post extubation. Reached 727ml X 3.  [name] 05/19/2018, 12:35 AM

## 2018-05-19 NOTE — Progress Notes (Signed)
TCTS BRIEF SICU PROGRESS NOTE  1 Day Post-Op  S/P Procedure(s) (LRB): AORTIC VALVE REPAIR using HAART 300 Aortic Annuloplasty Device size 78mm (N/A) MITRAL VALVE REPAIR (MVR) using 4D Memo Ring size 30 (N/A) MAZE (N/A) TRANSESOPHAGEAL ECHOCARDIOGRAM (TEE) (N/A) CLIPPING OF ATRIAL APPENDAGE using AtriCure Clip size 45   Feels sore in chest.  No SOB NSR w/ stable BP off neo Breathing comfortably w/ O2 sats 95-97% on 6 L/min UOP adequate Labs okay  Plan: Will start lasix drip.  Try oral pain relievers.  Rexene Alberts, MD 05/19/2018 6:21 PM

## 2018-05-19 NOTE — Progress Notes (Addendum)
[facility] DAILY ICU PROGRESS NOTE                   [address]            [place] [address]          [phone]   1 Day Post-Op Procedure(s) (LRB): AORTIC VALVE REPAIR using HAART 300 Aortic Annuloplasty Device size 41m (N/A) MITRAL VALVE REPAIR (MVR) using 4D Memo Ring size 30 (N/A) MAZE (N/A) TRANSESOPHAGEAL ECHOCARDIOGRAM (TEE) (N/A) CLIPPING OF ATRIAL APPENDAGE using AtriCure Clip size 45  Total Length of Stay:  LOS: 1 day   Subjective: C/O of some pain, anxious at times Some SOB AV paced- some PVC's  Objective: Vital signs in last 24 hours: Temp:  [95.2 F (35.1 C)-100 F (37.8 C)] 100 F (37.8 C) (11/06 0600) Pulse Rate:  [88-90] 89 (11/06 0600) Cardiac Rhythm: A-V Sequential paced (11/06 0400) Resp:  [12-37] 37 (11/06 0600) BP: (87-118)/(52-82) 105/71 (11/06 0600) SpO2:  [90 %-100 %] 94 % (11/06 0600) Arterial Line BP: (75-132)/(49-84) 125/56 (11/06 0600) FiO2 (%):  [40 %-100 %] 40 % (11/05 2342) Weight:  [130.4 kg] 130.4 kg (11/06 0500)  Filed Weights   05/19/18 0500  Weight: 130.4 kg    Weight change:    Hemodynamic parameters for last 24 hours: PAP: (29-41)/(8-24) 31/13 CO:  [2.2 L/min-5.3 L/min] 5.3 L/min CI:  [0.9 L/min/m2-2.2 L/min/m2] 2.2 L/min/m2  Intake/Output from previous day: 11/05 0701 - 11/06 0700 In: 68832[I.V.:4697.2; Blood:850; IV Piggyback:1243.9] Out: 4766 [Urine:3460; Blood:500; Chest Tube:806]  Intake/Output this shift: No intake/output data recorded.  Current Meds: Scheduled Meds: . acetaminophen  1,000 mg Oral Q6H   Or  . acetaminophen (TYLENOL) oral liquid 160 mg/5 mL  1,000 mg Per Tube Q6H  . aspirin EC  325 mg Oral Daily   Or  . aspirin  324 mg Per Tube Daily  . bisacodyl  10 mg Oral Daily   Or  . bisacodyl  10 mg Rectal Daily  . chlorhexidine gluconate (MEDLINE KIT)  15 mL Mouth Rinse BID  . Chlorhexidine Gluconate Cloth  6 each Topical Daily  . docusate sodium  200 mg Oral Daily  . insulin  regular  0-10 Units Intravenous TID WC  . mouth rinse  15 mL Mouth Rinse 10 times per day  . metoprolol tartrate  12.5 mg Oral BID   Or  . metoprolol tartrate  12.5 mg Per Tube BID  . [START ON 05/20/2018] pantoprazole  40 mg Oral Daily  . sodium chloride flush  3 mL Intravenous Q12H   Continuous Infusions: . sodium chloride    . sodium chloride    . sodium chloride 20 mL/hr at 05/18/18 1615  . albumin human 12.5 g (05/18/18 1612)  . cefUROXime (ZINACEF)  IV 1.5 g (05/19/18 0707)  . dexmedetomidine (PRECEDEX) IV infusion 0.6 mcg/kg/hr (05/19/18 0700)  . insulin 2.3 mL/hr at 05/19/18 0700  . lactated ringers    . lactated ringers    . lactated ringers 10 mL/hr at 05/19/18 0700  . milrinone 0.25 mcg/kg/min (05/19/18 0700)  . nitroGLYCERIN Stopped (05/18/18 1702)  . phenylephrine (NEO-SYNEPHRINE) Adult infusion 70 mcg/min (05/19/18 0700)   PRN Meds:.sodium chloride, albumin human, lactated ringers, levalbuterol, metoprolol tartrate, midazolam, morphine injection, ondansetron (ZOFRAN) IV, oxyCODONE, sodium chloride flush, traMADol  General appearance: alert, cooperative and no distress Heart: regular rate and rhythm, no rub and no murmur Lungs: clear anteriorly Abdomen: soft, nontender Extremities: minor edema Wound: dressing CDI  Lab Results: CBC: Recent Labs    05/18/18 1555  05/18/18 2122 05/18/18 2130  WBC 19.6*  --  18.0*  --   HGB 11.5*   < > 11.6* 10.5*  HCT 36.6*   < > 35.7* 31.0*  PLT 162  --  146*  --    < > = values in this interval not displayed.   BMET:  Recent Labs    05/18/18 2130 05/19/18 0417  NA 138 138  K 5.0 4.4  CL 109 113*  CO2  --  22  GLUCOSE 131* 132*  BUN 18 17  CREATININE 1.20 1.30*  CALCIUM  --  7.5*    CMET: Lab Results  Component Value Date   WBC 18.0 (H) 05/18/2018   HGB 10.5 (L) 05/18/2018   HCT 31.0 (L) 05/18/2018   PLT 146 (L) 05/18/2018   GLUCOSE 132 (H) 05/19/2018   ALT 48 (H) 05/14/2018   AST 50 (H) 05/14/2018   NA  138 05/19/2018   K 4.4 05/19/2018   CL 113 (H) 05/19/2018   CREATININE 1.30 (H) 05/19/2018   BUN 17 05/19/2018   CO2 22 05/19/2018   TSH 2.097 06/03/2013   INR 1.46 05/18/2018   HGBA1C 5.6 05/14/2018      PT/INR:  Recent Labs    05/18/18 1555  LABPROT 17.6*  INR 1.46   Radiology: Dg Chest Port 1 View  Result Date: 05/18/2018 CLINICAL DATA:  Followup aortic valve and mitral valve repair. EXAM: PORTABLE CHEST 1 VIEW COMPARISON:  05/14/2018 FINDINGS: Endotracheal tube tip is 4 cm above the carina. Nasogastric tube enters the stomach. Swan-Ganz catheter tip is in the main pulmonary artery. There are bilateral chest tubes. No pneumothorax. Mild basilar atelectasis. IMPRESSION: Good appearance following aortic and mitral valve surgery. Lines and tubes well positioned. No pneumothorax. Mild basilar atelectasis. Electronically Signed   By: Nelson Chimes M.D.   On: 05/18/2018 16:33     Assessment/Plan: S/P Procedure(s) (LRB): AORTIC VALVE REPAIR using HAART 300 Aortic Annuloplasty Device size 10m (N/A) MITRAL VALVE REPAIR (MVR) using 4D Memo Ring size 30 (N/A) MAZE (N/A) TRANSESOPHAGEAL ECHOCARDIOGRAM (TEE) (N/A) CLIPPING OF ATRIAL APPENDAGE using AtriCure Clip size 45  1 doing well POD#1 2 hemodyn stable with good CO, PAP mod  elevated , on .25  milrinone , NEO. - will keep milrinone as is for now(COox also noted-51). Wean Neo off as able 3 will apace at 811 underlying rhythm junct with LBBB 4 mod drainage from tubes, + air leak_ leave in place 5 interstitial edema on CXR - good uop- will need diuretics in future- wt up about 5 KG. Creat is trending higher- monitor closely 6 start low dose coumadin 7 ABL anemia is stable- monitor clinically,  leukocytosis trend is improving- systemic inflammatory response. Minor thrombocytopenia- monitor, no need for HIT eval currently 8 routine pulm toilet/rehab- wean O2 as able. CPAP at night for OSA 9 BS control adeq, wean off insulin gtt- A1C  5.6  WJohn GiovanniPProvidence Hospital Of North Houston LLC11/12/2017 7:34 AM  Pager [phone]   I have seen and examined the patient and agree with the assessment and plan as outlined.  Overall doing quite well.  Junctional bradycardia under pacer - AAI pacing.  Stable hemodynamics on low dose milrinone with Neo for BP support.  Breathing comfortably w/ O2 sats 95% on 6 L/min and CXR looks good.  Mobilize.  D/C lines.  Hold diuretics until BP improves some.  Leave chest tubes in for now.  Rexene Alberts, MD 05/19/2018 8:13 AM

## 2018-05-19 NOTE — Progress Notes (Signed)
Family places pt off and on home CPAP machine.

## 2018-05-20 ENCOUNTER — Encounter (HOSPITAL_COMMUNITY): Payer: Self-pay | Admitting: General Practice

## 2018-05-20 ENCOUNTER — Inpatient Hospital Stay (HOSPITAL_COMMUNITY): Payer: Medicare HMO

## 2018-05-20 ENCOUNTER — Other Ambulatory Visit: Payer: Self-pay

## 2018-05-20 LAB — COOXEMETRY PANEL
Carboxyhemoglobin: 1.2 % (ref 0.5–1.5)
METHEMOGLOBIN: 1.8 % — AB (ref 0.0–1.5)
O2 Saturation: 66.4 %
Total hemoglobin: 10.4 g/dL — ABNORMAL LOW (ref 12.0–16.0)

## 2018-05-20 LAB — CBC
HEMATOCRIT: 32.4 % — AB (ref 39.0–52.0)
Hemoglobin: 10.1 g/dL — ABNORMAL LOW (ref 13.0–17.0)
MCH: 27.4 pg (ref 26.0–34.0)
MCHC: 31.2 g/dL (ref 30.0–36.0)
MCV: 88 fL (ref 80.0–100.0)
PLATELETS: 136 10*3/uL — AB (ref 150–400)
RBC: 3.68 MIL/uL — ABNORMAL LOW (ref 4.22–5.81)
RDW: 16.3 % — ABNORMAL HIGH (ref 11.5–15.5)
WBC: 17.4 10*3/uL — AB (ref 4.0–10.5)
nRBC: 0 % (ref 0.0–0.2)

## 2018-05-20 LAB — PROTIME-INR
INR: 1.28
Prothrombin Time: 15.9 seconds — ABNORMAL HIGH (ref 11.4–15.2)

## 2018-05-20 LAB — BASIC METABOLIC PANEL
Anion gap: 7 (ref 5–15)
BUN: 22 mg/dL (ref 8–23)
CALCIUM: 7.3 mg/dL — AB (ref 8.9–10.3)
CHLORIDE: 107 mmol/L (ref 98–111)
CO2: 21 mmol/L — ABNORMAL LOW (ref 22–32)
CREATININE: 1.36 mg/dL — AB (ref 0.61–1.24)
GFR, EST NON AFRICAN AMERICAN: 53 mL/min — AB (ref >60)
Glucose, Bld: 155 mg/dL — ABNORMAL HIGH (ref 70–99)
Potassium: 4.1 mmol/L (ref 3.5–5.1)
SODIUM: 135 mmol/L (ref 135–145)

## 2018-05-20 LAB — GLUCOSE, CAPILLARY
Glucose-Capillary: 139 mg/dL — ABNORMAL HIGH (ref 70–99)
Glucose-Capillary: 126 mg/dL — ABNORMAL HIGH (ref 70–99)

## 2018-05-20 LAB — POTASSIUM: POTASSIUM: 4.1 mmol/L (ref 3.5–5.1)

## 2018-05-20 LAB — MAGNESIUM: MAGNESIUM: 2.2 mg/dL (ref 1.7–2.4)

## 2018-05-20 MED ORDER — OXYCODONE HCL 5 MG PO TABS
5.0000 mg | ORAL_TABLET | ORAL | Status: DC | PRN
  Administered 2018-05-20 (×2): 10 mg via ORAL
  Administered 2018-05-20 (×2): 5 mg via ORAL
  Administered 2018-05-20 – 2018-05-23 (×13): 10 mg via ORAL
  Administered 2018-05-23 (×2): 5 mg via ORAL
  Administered 2018-05-23 – 2018-05-24 (×7): 10 mg via ORAL
  Filled 2018-05-20 (×9): qty 2
  Filled 2018-05-20: qty 1
  Filled 2018-05-20 (×9): qty 2
  Filled 2018-05-20: qty 1
  Filled 2018-05-20 (×4): qty 2

## 2018-05-20 MED ORDER — MOVING RIGHT ALONG BOOK
Freq: Once | Status: AC
  Administered 2018-05-20: 10:00:00
  Filled 2018-05-20: qty 1

## 2018-05-20 MED ORDER — INSULIN ASPART 100 UNIT/ML ~~LOC~~ SOLN
0.0000 [IU] | Freq: Three times a day (TID) | SUBCUTANEOUS | Status: DC
  Administered 2018-05-20 – 2018-05-21 (×2): 2 [IU] via SUBCUTANEOUS

## 2018-05-20 MED ORDER — SODIUM CHLORIDE 0.9% FLUSH
3.0000 mL | Freq: Two times a day (BID) | INTRAVENOUS | Status: DC
  Administered 2018-05-20 – 2018-05-24 (×7): 3 mL via INTRAVENOUS

## 2018-05-20 MED ORDER — ASPIRIN EC 81 MG PO TBEC
81.0000 mg | DELAYED_RELEASE_TABLET | Freq: Every day | ORAL | Status: DC
  Administered 2018-05-20 – 2018-05-24 (×5): 81 mg via ORAL
  Filled 2018-05-20 (×5): qty 1

## 2018-05-20 MED ORDER — POTASSIUM CHLORIDE CRYS ER 20 MEQ PO TBCR
20.0000 meq | EXTENDED_RELEASE_TABLET | Freq: Two times a day (BID) | ORAL | Status: DC
  Filled 2018-05-20: qty 1

## 2018-05-20 MED ORDER — SODIUM CHLORIDE 0.9% FLUSH: 3.0000 mL | INTRAVENOUS | Status: DC | PRN

## 2018-05-20 MED ORDER — SODIUM CHLORIDE 0.9 % IV SOLN: 250.0000 mL | INTRAVENOUS | Status: DC | PRN

## 2018-05-20 MED FILL — Potassium Chloride Inj 2 mEq/ML: INTRAVENOUS | Qty: 40 | Status: CN

## 2018-05-20 MED FILL — Heparin Sodium (Porcine) Inj 1000 Unit/ML: INTRAMUSCULAR | Qty: 30 | Status: CN

## 2018-05-20 MED FILL — Magnesium Sulfate Inj 50%: INTRAMUSCULAR | Qty: 10 | Status: CN

## 2018-05-20 NOTE — Progress Notes (Signed)
Patient moved to 4E09 via wheelchair. Wife notified about transfer and aware. All belongings moved with patient. Receiving RN at bedside.

## 2018-05-20 NOTE — Progress Notes (Signed)
[address]       [place] [address]             [phone]      2 Days Post-Op Procedure(s) (LRB): AORTIC VALVE REPAIR using HAART 300 Aortic Annuloplasty Device size 103mm (N/A) MITRAL VALVE REPAIR (MVR) using 4D Memo Ring size 30 (N/A) MAZE (N/A) TRANSESOPHAGEAL ECHOCARDIOGRAM (TEE) (N/A) CLIPPING OF ATRIAL APPENDAGE using AtriCure Clip size 45 Subjective: feeling better this am, pain better controlled, much less anxious  Objective: Vital signs in last 24 hours: Temp:  [97.8 F (36.6 C)-99.9 F (37.7 C)] 98.1 F (36.7 C) (11/07 0000) Pulse Rate:  [77-83] 80 (11/07 0600) Cardiac Rhythm: A-V Sequential paced (11/06 2140) Resp:  [15-37] 19 (11/07 0600) BP: (88-126)/(56-71) 109/62 (11/07 0600) SpO2:  [89 %-97 %] 93 % (11/07 0600) Arterial Line BP: (92-127)/(19-55) 113/44 (11/06 1230) Weight:  [131.1 kg] 131.1 kg (11/07 0500)  Hemodynamic parameters for last 24 hours: PAP: (34-39)/(14-15) 39/14  Intake/Output from previous day: 11/06 0701 - 11/07 0700 In: 935.5 [I.V.:735.4; IV Piggyback:200.1] Out: 2210 [Urine:1690; Chest Tube:520] Intake/Output this shift: No intake/output data recorded.  General appearance: alert, cooperative and no distress Heart: regular rate and rhythm and no rub or murmur Lungs: minoe basilar crackles Abdomen: + BS, soft, non tender Extremities: + Edema in upper and lower exts- improved Wound: dressings CDI  Lab Results: Recent Labs    05/19/18 1707 05/19/18 1716 05/20/18 0345  WBC 14.9*  --  17.4*  HGB 10.5* 9.9* 10.1*  HCT 33.6* 29.0* 32.4*  PLT 141*  --  136*   BMET:  Recent Labs    05/19/18 0417  05/19/18 1716 05/20/18 0345  NA 138  --  137 135  K 4.4  --  4.1 4.1  CL 113*  --  105 107  CO2 22  --   --  21*  GLUCOSE 132*  --  148* 155*  BUN 17  --  19 22  CREATININE 1.30*   < > 1.10 1.36*  CALCIUM 7.5*  --   --  7.3*   < > = values in this interval not displayed.    PT/INR:  Recent Labs   05/20/18 0345  LABPROT 15.9*  INR 1.28   ABG    Component Value Date/Time   PHART 7.381 05/19/2018 0213   HCO3 19.9 (L) 05/19/2018 0213   TCO2 24 05/19/2018 1716   ACIDBASEDEF 4.0 (H) 05/19/2018 0213   O2SAT 66.4 05/20/2018 0342   CBG (last 3)  Recent Labs    05/19/18 1201 05/19/18 1541 05/19/18 2320  GLUCAP 126* 152* 138*    Meds Scheduled Meds: . acetaminophen  1,000 mg Oral Q6H   Or  . acetaminophen (TYLENOL) oral liquid 160 mg/5 mL  1,000 mg Per Tube Q6H  . aspirin EC  325 mg Oral Daily  . bisacodyl  10 mg Oral Daily   Or  . bisacodyl  10 mg Rectal Daily  . Chlorhexidine Gluconate Cloth  6 each Topical Daily  . docusate sodium  200 mg Oral Daily  . enoxaparin (LOVENOX) injection  30 mg Subcutaneous Q24H  . insulin aspart  0-24 Units Subcutaneous Q4H  . mouth rinse  15 mL Mouth Rinse BID  . pantoprazole  40 mg Oral Daily  . sodium chloride flush  3 mL Intravenous Q12H  . warfarin  2.5 mg Oral q1800  . Warfarin - Physician Dosing Inpatient   Does not apply [phone]  Continuous Infusions: . sodium chloride    . furosemide (LASIX) infusion 10 mg/hr (05/20/18 0600)  . lactated ringers    . lactated ringers 10 mL/hr at 05/20/18 0600  . milrinone 0.25 mcg/kg/min (05/20/18 0600)  . phenylephrine (NEO-SYNEPHRINE) Adult infusion Stopped (05/19/18 1244)   PRN Meds:.levalbuterol, metoprolol tartrate, morphine injection, ondansetron (ZOFRAN) IV, oxyCODONE, sodium chloride flush, traMADol  Xrays Dg Chest Port 1 View  Result Date: 05/19/2018 CLINICAL DATA:  Bypass surgery. EXAM: PORTABLE CHEST 1 VIEW COMPARISON:  05/18/2018 FINDINGS: The Swan-Ganz catheter is stable. The tip is in the proximal right pulmonary artery. The NG tube and ET tubes have been removed. Bilateral chest tubes are stable. No definite pneumothorax. Stable cardiac enlargement, mild pulmonary edema and streaky areas of atelectasis. No large pleural effusions. IMPRESSION: 1. Interval removal of the ET tube  and NG tubes. 2. Stable Swan-Ganz catheter and bilateral chest tubes and mediastinal drain tubes. 3. Stable cardiac enlargement. There is mild pulmonary edema and slight worsening atelectasis post extubation. No definite pleural effusions or pneumothorax. Electronically Signed   By: Marijo Sanes M.D.   On: 05/19/2018 09:51   Dg Chest Port 1 View  Result Date: 05/18/2018 CLINICAL DATA:  Followup aortic valve and mitral valve repair. EXAM: PORTABLE CHEST 1 VIEW COMPARISON:  05/14/2018 FINDINGS: Endotracheal tube tip is 4 cm above the carina. Nasogastric tube enters the stomach. Swan-Ganz catheter tip is in the main pulmonary artery. There are bilateral chest tubes. No pneumothorax. Mild basilar atelectasis. IMPRESSION: Good appearance following aortic and mitral valve surgery. Lines and tubes well positioned. No pneumothorax. Mild basilar atelectasis. Electronically Signed   By: Nelson Chimes M.D.   On: 05/18/2018 16:33    Assessment/Plan: S/P Procedure(s) (LRB): AORTIC VALVE REPAIR using HAART 300 Aortic Annuloplasty Device size 71mm (N/A) MITRAL VALVE REPAIR (MVR) using 4D Memo Ring size 30 (N/A) MAZE (N/A) TRANSESOPHAGEAL ECHOCARDIOGRAM (TEE) (N/A) CLIPPING OF ATRIAL APPENDAGE using AtriCure Clip size 45  1 doing well 2 appears to be sinus under pacer with some PAC's 3 BP a little soft, low dos milrinone- Coox 66, hopefully can cont to wean off 4 UOP good on lasix gtt, edema improved- cont,- monitor creat closely- bumped a little to 1.3 5 leukocytosis a little worse, systemic response- no fevers, monitor conservatively 6 H/H pretty stable, platelets  Pretty stable- monitor 7 INR 1.28 - cont coumadin 8 d/c chest tubes today 9 BS adeq control 10  tx to 4e- see orders  LOS: 2 days    [name] PA-C 05/20/2018 Pager [phone]

## 2018-05-20 NOTE — Progress Notes (Signed)
Home CPAP. Patient has placed on self.

## 2018-05-21 ENCOUNTER — Encounter (HOSPITAL_COMMUNITY): Payer: Self-pay | Admitting: Thoracic Surgery (Cardiothoracic Vascular Surgery)

## 2018-05-21 ENCOUNTER — Inpatient Hospital Stay (HOSPITAL_COMMUNITY): Payer: Medicare HMO

## 2018-05-21 LAB — PROTIME-INR
INR: 1.21
PROTHROMBIN TIME: 15.2 s (ref 11.4–15.2)

## 2018-05-21 LAB — CBC
HCT: 33.9 % — ABNORMAL LOW (ref 39.0–52.0)
Hemoglobin: 10.4 g/dL — ABNORMAL LOW (ref 13.0–17.0)
MCH: 26.9 pg (ref 26.0–34.0)
MCHC: 30.7 g/dL (ref 30.0–36.0)
MCV: 87.6 fL (ref 80.0–100.0)
NRBC: 0 % (ref 0.0–0.2)
PLATELETS: 171 10*3/uL (ref 150–400)
RBC: 3.87 MIL/uL — ABNORMAL LOW (ref 4.22–5.81)
RDW: 16.7 % — ABNORMAL HIGH (ref 11.5–15.5)
WBC: 17.9 10*3/uL — AB (ref 4.0–10.5)

## 2018-05-21 LAB — BASIC METABOLIC PANEL
ANION GAP: 8 (ref 5–15)
BUN: 25 mg/dL — ABNORMAL HIGH (ref 8–23)
CALCIUM: 7.4 mg/dL — AB (ref 8.9–10.3)
CO2: 22 mmol/L (ref 22–32)
CREATININE: 1.38 mg/dL — AB (ref 0.61–1.24)
Chloride: 104 mmol/L (ref 98–111)
GFR, EST NON AFRICAN AMERICAN: 52 mL/min — AB (ref >60)
GLUCOSE: 95 mg/dL (ref 70–99)
Potassium: 4.1 mmol/L (ref 3.5–5.1)
Sodium: 134 mmol/L — ABNORMAL LOW (ref 135–145)

## 2018-05-21 LAB — GLUCOSE, CAPILLARY
GLUCOSE-CAPILLARY: 102 mg/dL — AB (ref 70–99)
Glucose-Capillary: 114 mg/dL — ABNORMAL HIGH (ref 70–99)
Glucose-Capillary: 116 mg/dL — ABNORMAL HIGH (ref 70–99)
Glucose-Capillary: 123 mg/dL — ABNORMAL HIGH (ref 70–99)
Glucose-Capillary: 106 mg/dL — ABNORMAL HIGH (ref 70–99)

## 2018-05-21 MED ORDER — POTASSIUM CHLORIDE CRYS ER 20 MEQ PO TBCR
20.0000 meq | EXTENDED_RELEASE_TABLET | Freq: Every day | ORAL | Status: DC
  Administered 2018-05-21: 20 meq via ORAL
  Filled 2018-05-21: qty 1

## 2018-05-21 MED ORDER — FUROSEMIDE 40 MG PO TABS
40.0000 mg | ORAL_TABLET | Freq: Every day | ORAL | Status: DC
  Administered 2018-05-21: 40 mg via ORAL
  Filled 2018-05-21: qty 1

## 2018-05-21 MED ORDER — FUROSEMIDE 10 MG/ML IJ SOLN
40.0000 mg | Freq: Once | INTRAMUSCULAR | Status: AC
  Administered 2018-05-21: 40 mg via INTRAVENOUS
  Filled 2018-05-21: qty 4

## 2018-05-21 MED ORDER — POTASSIUM CHLORIDE CRYS ER 20 MEQ PO TBCR
20.0000 meq | EXTENDED_RELEASE_TABLET | Freq: Two times a day (BID) | ORAL | Status: DC
  Administered 2018-05-21 – 2018-05-24 (×6): 20 meq via ORAL
  Filled 2018-05-21 (×5): qty 1
  Filled 2018-05-21: qty 2

## 2018-05-21 MED ORDER — FUROSEMIDE 40 MG PO TABS
40.0000 mg | ORAL_TABLET | Freq: Two times a day (BID) | ORAL | Status: DC
  Administered 2018-05-22 – 2018-05-24 (×5): 40 mg via ORAL
  Filled 2018-05-21 (×5): qty 1

## 2018-05-21 MED ORDER — WARFARIN SODIUM 5 MG PO TABS
5.0000 mg | ORAL_TABLET | Freq: Every day | ORAL | Status: DC
  Administered 2018-05-21 – 2018-05-23 (×3): 5 mg via ORAL
  Filled 2018-05-21 (×3): qty 1

## 2018-05-21 MED FILL — Magnesium Sulfate Inj 50%: INTRAMUSCULAR | Qty: 10 | Status: AC

## 2018-05-21 MED FILL — Heparin Sodium (Porcine) Inj 1000 Unit/ML: INTRAMUSCULAR | Qty: 30 | Status: AC

## 2018-05-21 MED FILL — Potassium Chloride Inj 2 mEq/ML: INTRAVENOUS | Qty: 40 | Status: AC

## 2018-05-21 NOTE — Progress Notes (Signed)
CARDIAC REHAB PHASE I   Offered to walk with pt. First time pt declined to get washed up first. After getting cleaned up, pt stated he was too tired to go for a walk and requested to go after lunch. Will try again as time allows.  Rufina Falco, RN BSN 05/21/2018 11:00AM

## 2018-05-21 NOTE — Progress Notes (Signed)
CARDIAC REHAB PHASE I   PRE:  Rate/Rhythm: 80 paced  MODE:  Ambulation: 365ft   POST:  Rate/Rhythm: 80 paced  BP:  Sitting: 108/63    SaO2: 95 3L   Pt ambulated 380ft in hallway standby assist with front wheel walker. Pt denied CP or SOB. Encouraged pt to walk more today after some rest. Will continue to follow.  [phone] Rufina Falco, RN BSN 05/21/2018 2:32 PM

## 2018-05-21 NOTE — Discharge Summary (Signed)
Physician Discharge Summary  Patient ID: [name] MRN: [id number] DOB/AGE: 12-17-1950 67 y.o.  Admit date: 05/18/2018 Discharge date: 05/24/2018  Admission Diagnoses: Moderate aortic insufficiency and severe mitral regurgitation.  Persistent atrial fibrillation  Discharge Diagnoses:  Principal Problem:   S/P aortic valve repair + mitral valve repair + maze procedure Active Problems:   Obstructive sleep apnea   Atrial fibrillation, persistent   Hypertensive cardiovascular disease   Obesity (BMI 30-39.9)   Chronic diastolic congestive heart failure (HCC)   Mitral regurgitation   Aortic insufficiency   S/P mitral valve repair   S/P Maze operation for atrial fibrillation   Patient Active Problem List   Diagnosis Date Noted  . S/P aortic valve repair + mitral valve repair + maze procedure 05/18/2018  . S/P mitral valve repair 05/18/2018  . S/P Maze operation for atrial fibrillation 05/18/2018  . Mitral regurgitation 04/21/2018  . Aortic insufficiency 04/21/2018  . Obesity (BMI 30-39.9)   . Chronic diastolic congestive heart failure ([place])   . Hypertensive cardiovascular disease 07/20/2013  . Left atrial enlargement 07/20/2013  . Obstructive sleep apnea 05/11/2013  . Atrial fibrillation, persistent   . Instability of prosthetic hip ([place]) 04/09/2012  . Pain due to total hip replacement ([place]) 10/08/2011   HPI:  Patient is a 67 year old obese male with history of chronic atrial fibrillation that is now persistent,hypertension,chronic diastolic congestive heart failure, and obstructive sleep apnea who has been referred for surgical consultation to discuss treatment options for management of persistent atrial fibrillation.  The patient states that he was first diagnosed with atrial fibrillation approximately 5 years ago. He was initially evaluated by Dr. Alvester Chou and eventually referred to Dr. Rayann Heman for management. He was managed with Tikosyn successfully for approximately 2  years, but he then began to develop recurrent episodes of paroxysmal atrial fibrillation that increased in frequency and duration. He eventually developed persistent atrial fibrillation and underwent cardioversion on 2 occasions in 2014 and 2015.Nuclear stress test performed April 2018 revealed moderate left ventricular chamber enlargement with no evidence for myocardial ischemia.Echocardiogram performed in April 2018 revealed normal left ventricular systolic function with moderate left ventricular hypertrophy. There was mild aortic insufficiency, mild mitral regurgitation, and severe left atrial enlargement reported. He has remained in persistent atrial fibrillation since last fall without any spontaneous recurrence to sinus rhythm. He was seen in follow-up recently by Dr. Rayann Heman and long-term treatment options were discussed. Catheter based ablation was discussed as an alternative but felt likely to be associated with diminished long-term success because of severe left atrial enlargement noted on echocardiogram in the setting of obesity with obstructive sleep apnea. Cardiothoracic surgical consultation was requested to discuss surgical options including the Maze procedure.  The patient is married and lives locally in [place] with his wife. He is originally from [place]. 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 He is currently semiretired and spends most of his time locally at his home. He enjoys working around the house out of doors. He does not exercise on a regular basis. He has been overweight all of his adult life. He describes stable symptoms of exertional shortness of breath and fatigue did seem to wax and wane in severity. He denies any history of resting shortness of breath, PND, orthopnea, or lower extremity edema. He used to experience symptoms of palpitations when he  would go in and out of sinus rhythm but  he no longer experiences palpitations. He denies any exertional chest pain or chest tightness. He reports occasional transient episodes of tightness across his chest that do not seem to be related to activity. He has been chronically anticoagulated using Xarelto for approximately 5 years without any history of bleeding complications, TIA, or stroke.  Patient is a 67 year old obese white male with history of long-standing persistent atrial fibrillation, hypertension, chronic diastolic congestive heart failure, and obstructive sleep apnea who returns to the office today to discuss surgical options for treatment of atrial fibrillation. He was originally seen in consultation on February 02, 2018.At that time the patient wanted to wait until the fall to proceed with further diagnostic work-up and possible surgery. He was last seen in our office on April 12, 2018. Since then he underwent transesophageal echocardiogram and diagnostic cardiac catheterization to rule out the presence of significant structural heart disease and/or coronary artery disease. TEE performed April 21, 2018 by Dr. Marlou Porch revealed moderate to severe aortic insufficiency and moderate to severe mitral regurgitation. There was left atrium chamber enlargement. There was no thrombus in the left atrial appendage. LV systolic function was moderately reduced with ejection fraction estimated 35 to 40%. There were no significant wall motion abnormalities. Left and right heart catheterization performed by Dr. and revealed no significant coronary artery disease but confirmed the presence of moderate pulmonary hypertension and moderately elevated left heart filling pressures with large V waves on wedge tracing consistent with severe mitral regurgitation.   Patient returns the office today to discuss the results of these tests and consider treatment options further. He complains that ever since his  transesophageal echocardiogram he has had a persistent dry hacking nonproductive cough. He states that the first couple of days the cough was quite severe and he could barely talk. Symptoms have improved and he was given a prescription for Tessalon Perles, Tussionex, and a steroid Dosepak. He seems to be improving. He denies any fevers or chills. He has not had a productive cough. He has no difficulty swallowing. His voice has returned to normal. The remainder of his review of systems is unchanged from previously. He continues to experience stable symptoms of exertional shortness of breath and fatigue consistent with chronic diastolic congestive heart failure. He denies resting shortness of breath, PND, or orthopnea. He reports occasional mild lower extremity edema. He is not having chest pain or chest tightness. He does not experience palpitations, dizzy spells, nor syncope.  Patient returns the office today with tentative plans to proceed with elective aortic valve repair or replacement, mitral valve repair, and Maze procedure in the operating room tomorrow. He was last seen here in our office on May 04, 2018 at which time we made tentative plans for surgery. At that time the patient was also recovering from an upper respiratory tract infection, and chest radiograph revealed a faint opacity at the left lung base suggestive of possible pneumonia. He completed a course of oral antibiotics and he states that his cough has essentially completely resolved. He states that he feels as good as he has felt in several months and he is looking forward to proceeding with surgery. The remainder of his review of systems is unchanged from previously.  The patient was admitted electively for both aortic valve and mitral valve repair with maze procedure.  Discharged Condition: good  Hospital Course: Patient was admitted electively and on 05/18/2018 was taken to the operating room where he underwent the  below described procedure.  He tolerated it  well and was taken to the surgical intensive care unit in stable condition.  Postoperative hospital course:  Patient is overall progressed well.  He was weaned from the ventilator using standard postoperative protocols without difficulty.  He has maintained stable hemodynamics, initially requiring some inotropic and blood pressure support but these were able to be weaned without difficulty.  He does have significant postoperative volume overload and is responding well initially to intravenous Lasix drip with transition to oral regimen.  All routine lines, monitors and drainage devices have been discontinued in the standard fashion.  He does have an expected acute blood loss anemia which is stable.  He does have some postoperative leukocytosis which is consistent with systemic hematuria response.  He is not having any fevers or any difficulties with his incisions.  Oxygen is being weaned and he is maintaining good saturations.  He does have some mild postoperative renal insufficiency which is being monitored closely.  Most recent BUN and creatinine are 22/1.25.  He has been started on Coumadin and his most recent INR is 1.76.  His Coumadin dose at time of discharge is 4 mg.  He will be followed at [facility] cardiology Coumadin clinic.Marland Kitchen Blood pressure is a patient has been on the soft side but later in hospitalization has increased.  He will be restarted on a lower dose of his micardis at time of discharge.Marland Kitchen  He also initially did require some temporary pacemaker and has not been started on a beta-blocker.  Incisions are noted to be healing well.  He is tolerating gradually increasing activities using cardiac rehab modalities.  At the time of discharge the patient is felt to be quite stable.  Consults: None  Significant Diagnostic Studies: Routine postoperative labs and serial chest x-rays  Treatments: surgery:  CARDIOTHORACIC SURGERY OPERATIVE NOTE  Date of  Procedure:                05/18/2018  Preoperative Diagnosis:        Moderate Aortic Insufficiency  Severe Mitral Regurgitation  Recurrent Persistent Atrial Fibrillation  Postoperative Diagnosis:    Same  Procedure:       Aortic Valve Repair             Complex valvuloplasty including suture plication of prolapsing right coronary leaflet             Biostable HAART aortic ring annuloplasty (size 44mm, ref #[id number], lot #[id number])   Mitral Valve Repair             Complex valvuloplasty including artificial Gore-tex neochord placement x4             Sorin Memo 4D ring annuloplasty (size 48mm, model #4DM-30, serial #[id number])   Maze Procedure              complete bilateral atrial lesion set using bipolar radiofrequency and cryothermy ablation             clipping of left atrial appendage (Atricure Pro245 left atrial clip size 45 mm)               Surgeon:        Valentina Gu. Roxy Manns, MD  Assistant:       [name], PA-C  Anesthesia:    Roderic Palau, MD  Operative Findings: ? Prolapse of right coronary leaflet of aortic valve with type II dysfunction causing moderate-severe aortic insufficiency ? Type I and type II mitral valve dysfunction with annular dilatation and prolapse of middle scallop of  anterior leaflet causing severe mitral regurgitation ? Mild left ventricular systolic dysfunction ? No residual aortic insufficiency after successful valve repair ? No residual mitral regurgitation after successful valve repair  Discharge Exam: Blood pressure (!) 142/88, pulse 69, temperature 97.9 F (36.6 C), temperature source Oral, resp. rate 14, weight 129.5 kg, SpO2 95 %.  General appearance: alert, cooperative and no distress Heart: regular rate and rhythm and no murmur Lungs: clear to auscultation bilaterally Abdomen: benign Extremities: min edema Wound: incis healing well  Disposition: Discharge disposition: 01-Home or Self Care       Discharge  Instructions    Amb Referral to Cardiac Rehabilitation   Complete by:  As directed    Diagnosis:  Valve Repair   Valve:   Aortic Mitral     Discharge patient   Complete by:  As directed    After echo has been done   Discharge disposition:  01-Home or Self Care   Discharge patient date:  05/24/2018     Allergies as of 05/24/2018      Reactions   Oxycodone Rash, Other (See Comments)   Can tolerate Hydrocodone      Medication List    STOP taking these medications   chlorpheniramine-HYDROcodone 10-8 MG/5ML Suer Commonly known as:  TUSSIONEX   metoprolol succinate 50 MG 24 hr tablet Commonly known as:  TOPROL-XL   XARELTO 20 MG Tabs tablet Generic drug:  rivaroxaban     TAKE these medications   aspirin 81 MG EC tablet Take 1 tablet (81 mg total) by mouth daily. Start taking on:  05/25/2018   furosemide 40 MG tablet Commonly known as:  LASIX Take 1 tablet (40 mg total) by mouth daily.   KLOR-CON 10 10 MEQ tablet Generic drug:  potassium chloride Take 1 tablet (10 mEq total) by mouth daily. Take 1 extra tablet Mon, Wed, Fri What changed:  additional instructions   oxyCODONE 5 MG immediate release tablet Commonly known as:  Oxy IR/ROXICODONE Take 1-2 tablets (5-10 mg total) by mouth every 6 (six) hours as needed for up to 7 days for severe pain.   PROBIOTIC PO Take 1 capsule by mouth 3 (three) times a week.   telmisartan 20 MG tablet Commonly known as:  MICARDIS Take 1 tablet (20 mg total) by mouth daily. What changed:    medication strength  how much to take   warfarin 2 MG tablet Commonly known as:  COUMADIN Take 2 tablets (4 mg total) by mouth daily at 6 PM. As directed by the coumadin clinic      Follow-up Information    Rexene Alberts, MD Follow up.   Specialty:  Cardiothoracic Surgery Why:  Appointment to see the surgeon on 06/21/2018 at 1 PM.  Please obtain a chest x-ray at [facility] at 12:30 PM.  [facility] imaging is located in the  same office complex on the first floor. Contact information: [address] [place] [address] [place] Go on 05/27/2018.   Specialty:  Cardiology Why:  For coumdadin check @10 :30am  Contact information: [address] [place], [place], [place]. Go on 06/09/2018.   Specialties:  Cardiology, Radiology Why:  @3 :30pm for hosptial follow up Contact information: [address] [place] [address] [phone]          The patient has been discharged on:  1.Beta Blocker:  Yes [   ]                              No   [n   ]                              If No, reason:concerns for low rates post op  2.Ace Inhibitor/ARB: Yes [ y  ]                                     No  [    ]                                     If No, reason:  3.Statin:   Yes [   ]                  No  [ n  ]                  If No, reason:no coronary disease  4.Shela CommonsVelta Addison  Blue.Reese   ]                  No   [   ]                  If No, reason:  Signed: Wayne E Gold 05/24/2018, 9:00 AM

## 2018-05-21 NOTE — Progress Notes (Addendum)
[address]       [place] [address]             [phone]      3 Days Post-Op Procedure(s) (LRB): AORTIC VALVE REPAIR using HAART 300 Aortic Annuloplasty Device size 15mm (N/A) MITRAL VALVE REPAIR (MVR) using 4D Memo Ring size 30 (N/A) MAZE (N/A) TRANSESOPHAGEAL ECHOCARDIOGRAM (TEE) (N/A) CLIPPING OF ATRIAL APPENDAGE using AtriCure Clip size 45 Subjective: C/o feeling "dry " from the oxygen conts to feel better overall  Objective: Vital signs in last 24 hours: Temp:  [97.5 F (36.4 C)-98.8 F (37.1 C)] 97.9 F (36.6 C) (11/08 0408) Pulse Rate:  [67-81] 80 (11/08 0408) Cardiac Rhythm: Atrial paced (11/07 1916) Resp:  [15-22] 16 (11/08 0408) BP: (102-121)/(60-75) 110/69 (11/08 0408) SpO2:  [90 %-99 %] 93 % (11/08 0408)  Hemodynamic parameters for last 24 hours:    Intake/Output from previous day: 11/07 0701 - 11/08 0700 In: 768 [P.O.:540; I.V.:128; IV Piggyback:100] Out: 2640 [Urine:2600; Chest Tube:40] Intake/Output this shift: No intake/output data recorded.  General appearance: alert, cooperative and no distress Heart: regular rate and rhythm Lungs: clear to auscultation bilaterally Abdomen: benign Extremities: minoer edema Wound: dressing CDI  Lab Results: Recent Labs    05/20/18 0345 05/21/18 0401  WBC 17.4* 17.9*  HGB 10.1* 10.4*  HCT 32.4* 33.9*  PLT 136* 171   BMET:  Recent Labs    05/20/18 0345 05/20/18 1537 05/21/18 0401  NA 135  --  134*  K 4.1 4.1 4.1  CL 107  --  104  CO2 21*  --  22  GLUCOSE 155*  --  95  BUN 22  --  25*  CREATININE 1.36*  --  1.38*  CALCIUM 7.3*  --  7.4*    PT/INR:  Recent Labs    05/21/18 0401  LABPROT 15.2  INR 1.21   ABG    Component Value Date/Time   PHART 7.381 05/19/2018 0213   HCO3 19.9 (L) 05/19/2018 0213   TCO2 24 05/19/2018 1716   ACIDBASEDEF 4.0 (H) 05/19/2018 0213   O2SAT 66.4 05/20/2018 0342   CBG (last 3)  Recent Labs    05/19/18 2320 05/20/18 0749  05/20/18 1135  GLUCAP 138* 139* 126*    Meds Scheduled Meds: . acetaminophen  1,000 mg Oral Q6H   Or  . acetaminophen (TYLENOL) oral liquid 160 mg/5 mL  1,000 mg Per Tube Q6H  . aspirin EC  81 mg Oral Daily  . bisacodyl  10 mg Oral Daily   Or  . bisacodyl  10 mg Rectal Daily  . docusate sodium  200 mg Oral Daily  . enoxaparin (LOVENOX) injection  30 mg Subcutaneous Q24H  . insulin aspart  0-24 Units Subcutaneous TID AC & HS  . mouth rinse  15 mL Mouth Rinse BID  . pantoprazole  40 mg Oral Daily  . potassium chloride  20 mEq Oral BID  . sodium chloride flush  3 mL Intravenous Q12H  . sodium chloride flush  3 mL Intravenous Q12H  . warfarin  2.5 mg Oral q1800  . Warfarin - Physician Dosing Inpatient   Does not apply q1800   Continuous Infusions: . sodium chloride    . sodium chloride    . furosemide (LASIX) infusion 10 mg/hr (05/20/18 1832)  . lactated ringers     PRN Meds:.sodium chloride, levalbuterol, metoprolol tartrate, morphine injection, ondansetron (ZOFRAN) IV, oxyCODONE, sodium chloride flush, sodium chloride flush, traMADol  Xrays  Dg Chest Port 1 View  Result Date: 05/20/2018 CLINICAL DATA:  Sore chest EXAM: PORTABLE CHEST 1 VIEW COMPARISON:  Yesterday FINDINGS: Swan-Ganz catheter has been removed. Thoracic drains persist. Bilateral chest tubes have a more cranial positioning. A small right apical pneumothorax is present, 10% or less. Atelectatic type opacities. Stable enlargement of the postoperative heart. IMPRESSION: 1. Small right apical chest pneumothorax. 2. Unchanged low volumes with atelectasis and vascular congestion. Electronically Signed   By: Monte Fantasia M.D.   On: 05/20/2018 09:29    Assessment/Plan: S/P Procedure(s) (LRB): AORTIC VALVE REPAIR using HAART 300 Aortic Annuloplasty Device size 2mm (N/A) MITRAL VALVE REPAIR (MVR) using 4D Memo Ring size 30 (N/A) MAZE (N/A) TRANSESOPHAGEAL ECHOCARDIOGRAM (TEE) (N/A) CLIPPING OF ATRIAL APPENDAGE using  AtriCure Clip size 45   1 conts to do well 2 apaced at 80, BBB- may be able to d/c soon as rate in 70's underneath. Did have short burst of vtach- monitor, K+ is ok , MG++ ok, BP well controlled.  3 sats good on 3 liters, add humidity and wean 4 excellent UOP- will change to po lasix as edema, volume overload significantly improved Creat is stable 5 leukocytosis is stable, no fevers, H/H pretty stable. improved 6 INR not bumped- will increase coumadin dose 7 BS ok control 8 routine pulm toilet and cardiac rehab  LOS: 3 days    [name] 05/21/2018  I have seen and examined the patient and agree with the assessment and plan as outlined.  Looks quite good for POD3.  AAI pacing - junctional rhythm under pacer w/ HR 60-65.  No P waves seen.  Will try leaving pacer off.  Still needs diuresis.  Mobilize.  Consider restarting ARB in 2-3 days if BP will allow.  Rexene Alberts, MD 05/21/2018 1:46 PM

## 2018-05-21 NOTE — Care Management Important Message (Signed)
Important Message  Patient Details  Name: [name] MRN: [id number] Date of Birth: [birth year]   Medicare Important Message Given:  Yes    Barb Merino Caress Reffitt 05/21/2018, 3:48 PM

## 2018-05-21 NOTE — Plan of Care (Signed)
  Problem: Activity: Goal: Risk for activity intolerance will decrease Outcome: Progressing   Problem: Coping: Goal: Level of anxiety will decrease Outcome: Progressing   Problem: Elimination: Goal: Will not experience complications related to urinary retention Outcome: Progressing   Problem: Safety: Goal: Ability to remain free from injury will improve Outcome: Progressing   

## 2018-05-21 NOTE — Progress Notes (Signed)
Patient has home CPAP unit at bedside. RT placed unit within reach of patient and patient states he doe not need any help putting it on. Patient knows to call if he has any trouble.

## 2018-05-22 LAB — BASIC METABOLIC PANEL
Anion gap: 4 — ABNORMAL LOW (ref 5–15)
BUN: 28 mg/dL — AB (ref 8–23)
CALCIUM: 7.9 mg/dL — AB (ref 8.9–10.3)
CO2: 28 mmol/L (ref 22–32)
Chloride: 104 mmol/L (ref 98–111)
Creatinine, Ser: 1.42 mg/dL — ABNORMAL HIGH (ref 0.61–1.24)
GFR, EST AFRICAN AMERICAN: 58 mL/min — AB (ref >60)
GFR, EST NON AFRICAN AMERICAN: 50 mL/min — AB (ref >60)
GLUCOSE: 112 mg/dL — AB (ref 70–99)
Potassium: 4.9 mmol/L (ref 3.5–5.1)
SODIUM: 136 mmol/L (ref 135–145)

## 2018-05-22 LAB — PROTIME-INR
INR: 1.34
PROTHROMBIN TIME: 16.4 s — AB (ref 11.4–15.2)

## 2018-05-22 LAB — GLUCOSE, CAPILLARY: GLUCOSE-CAPILLARY: 114 mg/dL — AB (ref 70–99)

## 2018-05-22 MED ORDER — LACTULOSE 10 GM/15ML PO SOLN: 20.0000 g | Freq: Every day | ORAL | Status: DC | PRN

## 2018-05-22 NOTE — Progress Notes (Addendum)
      [address]       [place] [address]             [phone]      4 Days Post-Op Procedure(s) (LRB): AORTIC VALVE REPAIR using HAART 300 Aortic Annuloplasty Device size 11mm (N/A) MITRAL VALVE REPAIR (MVR) using 4D Memo Ring size 30 (N/A) MAZE (N/A) TRANSESOPHAGEAL ECHOCARDIOGRAM (TEE) (N/A) CLIPPING OF ATRIAL APPENDAGE using AtriCure Clip size 45   Subjective:  No new complaints.  Up in chairs states doing pretty good.  + ambulation  No BM  Objective: Vital signs in last 24 hours: Temp:  [97.2 F (36.2 C)-98.2 F (36.8 C)] 97.8 F (36.6 C) (11/09 0818) Pulse Rate:  [63-80] 63 (11/09 0818) Cardiac Rhythm: Heart block;Bundle branch block (11/09 0700) Resp:  [16-20] 19 (11/09 0818) BP: (108-133)/(63-77) 130/72 (11/09 0818) SpO2:  [92 %-98 %] 94 % (11/09 0818) Weight:  [130.5 kg] 130.5 kg (11/09 0459)  Intake/Output from previous day: 11/08 0701 - 11/09 0700 In: 1003 [P.O.:1000; I.V.:3] Out: 875 [Urine:875]  General appearance: alert, cooperative and no distress Heart: regular rate and rhythm Lungs: clear to auscultation bilaterally Abdomen: soft, non-tender; bowel sounds normal; no masses,  no organomegaly Extremities: edema 1+ pitting Wound: clean and dry  Lab Results: Recent Labs    05/20/18 0345 05/21/18 0401  WBC 17.4* 17.9*  HGB 10.1* 10.4*  HCT 32.4* 33.9*  PLT 136* 171   BMET:  Recent Labs    05/21/18 0401 05/22/18 0245  NA 134* 136  K 4.1 4.9  CL 104 104  CO2 22 28  GLUCOSE 95 112*  BUN 25* 28*  CREATININE 1.38* 1.42*  CALCIUM 7.4* 7.9*    PT/INR:  Recent Labs    05/22/18 0245  LABPROT 16.4*  INR 1.34   ABG    Component Value Date/Time   PHART 7.381 05/19/2018 0213   HCO3 19.9 (L) 05/19/2018 0213   TCO2 24 05/19/2018 1716   ACIDBASEDEF 4.0 (H) 05/19/2018 0213   O2SAT 66.4 05/20/2018 0342   CBG (last 3)  Recent Labs    05/21/18 1615 05/21/18 2056 05/22/18 0555  GLUCAP 102* 106* 114*     Assessment/Plan: S/P Procedure(s) (LRB): AORTIC VALVE REPAIR using HAART 300 Aortic Annuloplasty Device size 44mm (N/A) MITRAL VALVE REPAIR (MVR) using 4D Memo Ring size 30 (N/A) MAZE (N/A) TRANSESOPHAGEAL ECHOCARDIOGRAM (TEE) (N/A) CLIPPING OF ATRIAL APPENDAGE using AtriCure Clip size 45  1. CV- Remains in junction rhythm with rates in the 50s- not on BB currently 2. INR 1.34 for MV Repair, MAZE procedure- will repeat 5 mg daily today 3. Pulm- off oxygen, no acute issues, continue IS 4. Renal- creatinine stable, weight is trending down, continue Lasix 40 mg BID, K WNL 5. Expected post operative blood loss anemia, mild at 10.4 6. CBGs controlled, patient is not a diabetic, will d/c SSIP 7. Dispo- patient remains in junctional rhythm rate in the 50s, no BB at this time, continue coumadin for MV Repair/MAZE if no response will slowly titrate dose, continue diuretics, stop SSIP, possibly remove EPW today vs. tomorrow   LOS: 4 days    Ellwood Handler 05/22/2018 Patient seen and examined, agree with above  Remo Lipps C. Roxan Hockey, MD [facility] [phone]

## 2018-05-22 NOTE — Progress Notes (Signed)
CARDIAC REHAB PHASE I   PRE:  Rate/Rhythm: 70 SR   BP:  Sitting: 124/67       SaO2: 92% RA  MODE:  Ambulation: 370 ft   POST:  Rate/Rhythm: 79 SR  BP:  Sitting: 120/59 66 SR       SaO2: 95% RA   Pt ambulated 370 ft, with steady gait independently. Pt denied any complaints of CP, SOB or dizziness. Pt returned to recliner with feet elevated. Pt demonstrated good sternal precautions. Cued pt to not rely on hands as much when sitting down. Reviewed education with wife at bedside. Reviewed IS use, sternal precautions, restrictions, and Cardiac Rehab Phase II. Demonstrated standing and sitting properly. Pt asked for clean pair of socks, got pt a new pair. Will send referral to [facility] per pt's request. Call bell within reach and wife at bedside.  [phone]  Carma Lair MS, ACSM CEP  11:16 AM 05/22/2018

## 2018-05-22 NOTE — Progress Notes (Signed)
External pacer box disconnected and EPW's rolled and taped.

## 2018-05-22 NOTE — Discharge Instructions (Signed)
Aortic Valve Replacement/R Mitral Valve Replacement/Repair What You Need to Know About Warfarin Warfarin is a blood thinner (anticoagulant). Anticoagulants help to prevent the formation of blood clots. They also help to stop the growth of blood clots. Who should use warfarin? Warfarin is prescribed for people who are at risk for developing harmful blood clots, such as people who have: Surgically implanted mechanical heart valves. Irregular heart rhythms (atrial fibrillation). Certain clotting disorders. A history of harmful blood clotting in the past. This includes people who have had: A stroke. Blood clot in the lungs (pulmonary embolism, or PE). Blood clot in the legs (deep vein thrombosis, or DVT). An existing blood clot.  How is warfarin taken?  Warfarin is a medicine that you take by mouth (orally). Warfarin tablets come in different strengths. Each tablet strength is a different color, with the amount of warfarin printed on the tablet. If you get a new prescription filled and the color of your tablet is different than usual, tell your pharmacist or health care provider immediately. What blood tests do I need while taking warfarin? The goal of warfarin therapy is to lessen the clotting tendency of blood, but not to prevent clotting completely. Your health care provider will monitor the anticoagulation effect of warfarin closely and will adjust your dose as needed. Warfarin is a medicine that needs to be closely monitored, so it is very important to keep all lab visits and follow-up visits with your health care provider. While taking warfarin, you will need to have blood tests (prothrombin tests, or PT tests) regularly to measure your blood clotting time. This type of test can be done with a finger stick or a blood draw. What does the INR test result mean? The PT test results will be reported as the International Normalized Ratio (INR). The INR tells your health care provider whether your  dosage of warfarin needs to be changed. The longer it takes your blood to clot, the higher the INR. Your health care provider will tell you your target INR range. If your INR is not in your target range, your health care provider may adjust your dosage. If your INR is above your target range, there is a risk of bleeding. Your dosage of warfarin may need to be decreased. If your INR is below your target range, there is a risk of clotting. Your dosage of warfarin may need to be increased.  How often is the INR test needed? When you first start warfarin, you will usually have your INR checked every few days. You may need to have INR tests done more than once a week until you are taking the correct dosage of warfarin. After you have reached your target INR, your INR will be tested less often. However, you will need to have your INR checked at least once every 4-6 weeks for the entire time you are taking warfarin. What are the side effects of warfarin? Too much warfarin can cause bleeding (hemorrhage) in any part of the body, such as: Bleeding from the gums. Unexplained bruises. Bruises that get larger. Blood in the urine. Bloody or dark stools. Bleeding in the brain (hemorrhagic stroke). A nosebleed that is not easily stopped. Coughing up blood. Vomiting blood.  Warfarin use may also cause: Skin rash or irritations Nausea that does not go away. Severe pain in the back or joints. Painful toes that turn blue or purple (purple toe syndrome). Painful ulcers that do not go away (skin necrosis).  What are the signs and  symptoms of a blood clot? Too little warfarin can increase the risk of blood clots in your legs, lungs, or arms. Signs and symptoms of a DVT in your leg or arm may include: Pain or swelling in your leg or arm. Skin that is red or warm to the touch on your arm or leg.  Signs and symptoms of a pulmonary embolism may include: Shortness of breath or difficulty breathing. Chest  pain. Unexplained fever.  What are the signs and symptoms of a stroke? If you are taking too much or too little warfarin, you can have a stroke. Signs and symptoms of a stroke may include: Weakness or numbness of your face, arm, or leg, especially on one side of your body. Confusion or trouble thinking clearly. Difficulty seeing with one or both eyes. Difficulty walking or moving your arms or legs. Dizziness. Loss of balance or coordination. Trouble speaking, trouble understanding speech, or both (aphasia). Sudden, severe headache with no known cause. Partial or total loss of consciousness.  What precautions do I need to take while using warfarin?  Take warfarin exactly as told by your health care provider. Doing this helps you avoid bleeding or blood clots that could result in serious injury, pain, or disability. Take your medicine at the same time every day. If you forget to take your dose of warfarin, take it as soon as you remember that day. If you do not remember on that day, do not take an extra dose the next day. Contact your health care provider if you miss or take an extra dose. Do not change your dosage on your own to make up for missed or extra doses. Wear or carry identification that says that you are taking warfarin. Make sure that all health care providers, including your dentist, know you are taking warfarin. If you need surgery, talk with your health care provider about whether you should stop taking warfarin before your surgery. Avoid situations that cause bleeding. You may bleed more easily while taking warfarin. To limit bleeding, take the following actions: Use a softer toothbrush. Floss with waxed floss, not unwaxed floss. Shave with an electric razor, not with a blade. Limit your use of sharp objects. Avoid potentially harmful activities, such as contact sports. What do I need to know about warfarin and pregnancy or breastfeeding? Warfarin is not recommended during  the first trimester of pregnancy due to an increased risk of birth defects. In certain situations, a woman may take warfarin after her first trimester of pregnancy. If you are taking warfarin and you become pregnant or plan to become pregnant, contact your health care provider right away. If you plan to breastfeed while taking warfarin, talk with your health care provider first. What do I need to know about warfarin and alcohol or drug use? Avoid drinking alcohol, or limit alcohol intake to no more than 1 drink a day for nonpregnant women and 2 drinks a day for men. One drink equals 12 oz of beer, 5 oz of wine, or 1 oz of hard liquor. If you change the amount of alcohol that you drink, tell your health care provider. Your warfarin dosage may need to be changed. Avoid tobacco products, such as cigarettes, chewing tobacco, and e-cigarettes. If you need help quitting, ask your health care provider. If you change the amount of nicotine or tobacco that you use, tell your health care provider. Your warfarin dosage may need to be changed. Avoid street drugs while taking warfarin. The effects of street  drugs on warfarin are not known. What do I need to know about warfarin and other medicines or supplements? Many prescription and over-the-counter medicines can interfere with warfarin. Talk with your health care provider or your pharmacist before starting or stopping any new medicines. This includes over-the-counter vitamins, dietary supplements, herbal medicines, and pain medicines. Your warfarin dosage may need to be adjusted. Some common over-the-counter medicines that may increase the risk of bleeding while taking warfarin include: Acetaminophen. Aspirin NSAIDs, such as ibuprofen or naproxen. Vitamin E. What do I need to know about warfarin and my diet? It is important to maintain a normal, balanced diet while taking warfarin. Avoid major changes in your diet. If you are going to change your diet, talk  with your health care provider before making changes. Your health care provider may recommend that you work with a diet and nutrition specialist (dietitian). Vitamin K decreases the effect of warfarin, and it is found in many foods. Eat a consistent amount of foods that contain vitamin K. For example, you may decide to eat 2 vitamin K-containing foods each day. Most foods that are high in vitamin K are green and leafy. Common foods that contain high amounts of vitamin K include: Kale, raw or cooked. Spinach, raw or cooked. Collards, raw or cooked. Swiss chard, raw or cooked. Mustard greens, raw or cooked. Turnip greens, raw or cooked. Parsley, raw. Broccoli, cooked. Noodles, eggs, and spinach, enriched. Brussels sprouts, raw or cooked. Beet greens, raw or cooked. Endive, raw. Cabbage, cooked. Asparagus, cooked.  Foods that contain moderate amounts of vitamin K include: Broccoli, raw. Cabbage, raw. Bok choy, cooked. Green leaf lettuce, raw Prunes, stewed. Angie Fava. Kiwi. Edamame, cooked. Romaine lettuce, raw. Avocado. Tuna, canned in oil. Okra, cooked. Black-eyed peas, cooked. Green beans, cooked or raw. Blueberries, raw. Blackberries, raw. Peas, cooked or raw.  Contact a health care provider if: You miss a dose. You take an extra dose. You plan to have any kind of surgery or procedure. You are unable to take your medicine due to nausea, vomiting, or diarrhea. You have any major changes in your diet or you plan to make any major changes in your diet. You start or stop any over-the-counter medicine, prescription medicine, or dietary supplement. You become pregnant, plan to become pregnant, or think you may be pregnant. You have menstrual periods that are heavier than usual. You have unusual bruising. Get help right away if: You develop symptoms of an allergic reaction, such as: Swelling of the lips, face, tongue, mouth, or throat. Rash. Itching. Itchy, red, swollen  areas of skin (hives). Trouble breathing. Chest tightness. You have: Signs or symptoms of a stroke. Signs or symptoms of a blood clot. A fall or have an accident, especially if you hit your head. Blood in your urine. Your urine may look reddish, pinkish, or tea-colored. Blood in your stool. Your stool may be black or bright red. Bleeding that does not stop after applying pressure to the area for 30 minutes. Severe pain in your joints or back. Purple or blue toes. Skin ulcers that do not go away. You vomit blood or cough up blood. The blood may be bright red, or it may look like coffee grounds. These symptoms may represent a serious problem that is an emergency. Do not wait to see if the symptoms will go away. Get medical help right away. Call your local emergency services (911 in the [place] Do not drive yourself to the hospital. Summary Warfarin needs to be closely  monitored with blood tests. It is very important to keep all lab visits and follow-up visits with your health care provider. Make sure that you know your target INR range and your warfarin dosage. Wear or carry identification that says that you are taking warfarin. Take warfarin at the same time every day. Call your health care provider if you miss a dose or if you take an extra dose. Do not change the dosage of warfarin on your own. Know the signs and symptoms of blood clots, bleeding, and a stroke. Know when to get emergency medical help. Tell all health care providers who care for you that you are taking warfarin. Talk with your health care provider or your pharmacist before starting or stopping any new medicines. Monitor how much vitamin K you eat every day. Try to eat the same amount every day. This information is not intended to replace advice given to you by your health care provider. Make sure you discuss any questions you have with your health care provider. Document Released: 06/30/2005 Document Revised: 03/11/2016  Document Reviewed: 09/26/2015 Elsevier Interactive Patient Education  2017 [place] After This sheet gives you information about how to care for yourself after your procedure. Your health care provider may also give you more specific instructions. If you have problems or questions, contact your health care provider. What can I expect after the procedure? After the procedure, it is common to have: Pain at the incision area that may last for several weeks.  Follow these instructions at home: Incision care Follow instructions from your health care provider about how to take care of your incision. Make sure you: Wash your hands with soap and water before you change your bandage (dressing). If soap and water are not available, use hand sanitizer. Change your dressing as told by your health care provider. Leave stitches (sutures), skin glue, or adhesive strips in place. These skin closures may need to stay in place for 2 weeks or longer. If adhesive strip edges start to loosen and curl up, you may trim the loose edges. Do not remove adhesive strips completely unless your health care provider tells you to do that. Check your incision area every day for signs of infection. Check for: More redness, swelling, or pain. More fluid or blood. Warmth. Pus or a bad smell. Do not apply powder or lotion to the area. Driving Do not drive until your health care provider approves. Do not drive or use heavy machinery while taking prescription pain medicines. Bathing Do not take baths, swim, or use a hot tub for 2-4 weeks after surgery, or until your health care provider approves. Ask your health care provider if you may take showers. To wash the incision site, gently wash with soap and water and pat the area dry with a clean towel. Do not rub the incision area. That may cause bleeding. Activity Rest as told by your health care provider. Ask your health care provider when you can resume normal  activities, including sexual activity. Avoid the following activities for 6-8 weeks, or as long as directed: Lifting anything that is heavier than 10 lb (4.5 kg), or the limit that your health care provider tells you. Pushing or pulling things with your arms. Avoid climbing stairs and using the handrail to pull yourself up for the first 2-3 weeks after surgery. Avoid airplane travel for 4-6 weeks, or as long as directed. Avoid sitting for long periods of time and crossing your legs. Get up and  move around at least once every 1-2 hours. If you are taking blood thinners (anticoagulants), avoid activities that have a high risk of injury. Ask your health care provider what activities are safe for you. Lifestyle Limit alcohol intake to no more than 1 drink a day for nonpregnant women and 2 drinks a day for men. One drink equals 12 oz of beer, 5 oz of wine, or 1 oz of hard liquor. Do not use any products that contain nicotine or tobacco, such as cigarettes and e-cigarettes. If you need help quitting, ask your health care provider. General instructions Take your temperature every day and weigh yourself every morning for the first 7 days after surgery. Write your temperatures and weight down and take this record with you to any follow-up visits. Take over-the-counter and prescription medicines only as told by your health care provider. To prevent or treat constipation while you are taking prescription pain medicine, your health care provider may recommend that you: Drink enough fluid to keep your urine clear or pale yellow. Take over-the-counter or prescription medicines. Eat foods that are high in fiber, such as fresh fruits and vegetables, whole grains, and beans. Limit foods that are high in fat and processed sugars, such as fried and sweet foods. Follow instructions from your health care provider about eating or drinking restrictions. Wear compression stockings for at least 2 weeks, or as long as  told by your health care provider. These stockings help to prevent blood clots and reduce swelling in your legs. If your ankles are swollen after 2 weeks, continue to wear the stockings. Keep all follow-up visits as told by your health care provider. This is important. Contact a health care provider if: You develop a skin rash. Your weight is increasing each day over 2-3 days. You gain 2 lb (1 kg) or more in a single day. You have a fever. Get help right away if: You develop chest pain that feels different from the pain caused by your incision. You develop shortness of breath or difficulty breathing. You have more redness, swelling, or pain around your incision. You have more fluid or blood coming from your incision. Your incision feels warm to the touch. You have pus or a bad smell coming from your incision. You feel light-headed. This information is not intended to replace advice given to you by your health care provider. Make sure you discuss any questions you have with your health care provider. Document Released: 01/17/2005 Document Revised: 04/11/2016 Document Reviewed: 04/11/2016 Elsevier Interactive Patient Education  2018 [place], Care After Refer to this sheet in the next few weeks. These instructions provide you with information about caring for yourself after your procedure. Your health care provider may also give you more specific instructions. Your treatment has been planned according to current medical practices, but problems sometimes occur. Call your health care provider if you have any problems or questions after your procedure. What can I expect after the procedure? After the procedure, it is common to have:  Pain around your incision area.  A small amount of blood or clear fluid coming from your incision.  Follow these instructions at home: Eating and drinking   Follow instructions from your health care provider about eating or drinking  restrictions. ? Limit alcohol intake to no more than 1 drink per day for nonpregnant women and 2 drinks per day for men. One drink equals 12 oz of beer, 5 oz of wine, or 1 oz of hard liquor. ?  Limit how much caffeine you drink. Caffeine can affect your heart's rate and rhythm.  Drink enough fluid to keep your urine clear or pale yellow.  Eat a heart-healthy diet. This should include plenty of fresh fruits and vegetables. If you eat meat, it should be lean cuts. Avoid foods that are: ? High in salt, saturated fat, or sugar. ? Canned or highly processed. ? Fried. Activity  Return to your normal activities as told by your health care provider. Ask your health care provider what activities are safe for you.  Exercise regularly once you have recovered, as told by your health care provider.  Avoid sitting for more than 2 hours at a time without moving. Get up and move around at least once every 1-2 hours. This helps to prevent blood clots in the legs.  Do not lift anything that is heavier than 10 lb (4.5 kg) until your health care provider approves.  Avoid pushing or pulling things with your arms until your health care provider approves. This includes pulling on handrails to help you climb stairs. Incision care   Follow instructions from your health care provider about how to take care of your incision. Make sure you: ? Wash your hands with soap and water before you change your bandage (dressing). If soap and water are not available, use hand sanitizer. ? Change your dressing as told by your health care provider. ? Leave stitches (sutures), skin glue, or adhesive strips in place. These skin closures may need to stay in place for 2 weeks or longer. If adhesive strip edges start to loosen and curl up, you may trim the loose edges. Do not remove adhesive strips completely unless your health care provider tells you to do that.  Check your incision area every day for signs of infection. Check  for: ? More redness, swelling, or pain. ? More fluid or blood. ? Warmth. ? Pus or a bad smell. Medicines  Take over-the-counter and prescription medicines only as told by your health care provider.  If you were prescribed an antibiotic medicine, take it as told by your health care provider. Do not stop taking the antibiotic even if you start to feel better. Travel  Avoid airplane travel for as long as told by your health care provider.  When you travel, bring a list of your medicines and a record of your medical history with you. Carry your medicines with you. Driving  Ask your health care provider when it is safe for you to drive. Do not drive until your health care provider approves.  Do not drive or operate heavy machinery while taking prescription pain medicine. Lifestyle   Do not use any tobacco products, such as cigarettes, chewing tobacco, or e-cigarettes. If you need help quitting, ask your health care provider.  Resume sexual activity as told by your health care provider. Do not use medicines for erectile dysfunction unless your health care provider approves, if this applies.  Work with your health care provider to keep your blood pressure and cholesterol under control, and to manage any other heart conditions that you have.  Maintain a healthy weight. General instructions  Do not take baths, swim, or use a hot tub until your health care provider approves.  Do not strain to have a bowel movement.  Avoid crossing your legs while sitting down.  Check your temperature every day for a fever. A fever may be a sign of infection.  If you are a woman and you plan to become  pregnant, talk with your health care provider before you become pregnant.  Wear compression stockings if your health care provider instructs you to do this. These stockings help to prevent blood clots and reduce swelling in your legs.  Tell all health care providers who care for you that you have an  artificial (prosthetic) aortic valve. If you have or have had heart disease or endocarditis, tell all health care providers about these conditions as well.  Keep all follow-up visits as told by your health care provider. This is important. Contact a health care provider if:  You develop a skin rash.  You experience sudden, unexplained changes in your weight.  You have more redness, swelling, or pain around your incision.  You have more fluid or blood coming from your incision.  Your incision feels warm to the touch.  You have pus or a bad smell coming from your incision.  You have a fever. Get help right away if:  You develop chest pain that is different from the pain coming from your incision.  You develop shortness of breath or difficulty breathing.  You start to feel light-headed. These symptoms may represent a serious problem that is an emergency. Do not wait to see if the symptoms will go away. Get medical help right away. Call your local emergency services (911 in the [place] Do not drive yourself to the hospital. This information is not intended to replace advice given to you by your health care provider. Make sure you discuss any questions you have with your health care provider. Document Released: 01/16/2005 Document Revised: 12/06/2015 Document Reviewed: 06/03/2015 Elsevier Interactive Patient Education  2017 [place] on my medicine - Coumadin   (Warfarin)  This medication education was reviewed with me or my healthcare representative as part of my discharge preparation.  The pharmacist that spoke with me during my hospital stay was:  Einar Grad, [facility]  Why was Coumadin prescribed for you? Coumadin was prescribed for you because you have a blood clot or a medical condition that can cause an increased risk of forming blood clots. Blood clots can cause serious health problems by blocking the flow of blood to the heart, lung, or brain. Coumadin can  prevent harmful blood clots from forming. As a reminder your indication for Coumadin is:   Blood Clot Prevention After Heart Valve Surgery  What test will check on my response to Coumadin? While on Coumadin (warfarin) you will need to have an INR test regularly to ensure that your dose is keeping you in the desired range. The INR (international normalized ratio) number is calculated from the result of the laboratory test called prothrombin time (PT).  If an INR APPOINTMENT HAS NOT ALREADY BEEN MADE FOR YOU please schedule an appointment to have this lab work done by your health care provider within 7 days. Your INR goal is usually a number between:  2 to 3 or your provider may give you a more narrow range like 2-2.5.  Ask your health care provider during an office visit what your goal INR is.  What  do you need to  know  About  COUMADIN? Take Coumadin (warfarin) exactly as prescribed by your healthcare provider about the same time each day.  DO NOT stop taking without talking to the doctor who prescribed the medication.  Stopping without other blood clot prevention medication to take the place of Coumadin may increase your risk of developing a new clot or stroke.  Get  refills before you run out.  What do you do if you miss a dose? If you miss a dose, take it as soon as you remember on the same day then continue your regularly scheduled regimen the next day.  Do not take two doses of Coumadin at the same time.  Important Safety Information A possible side effect of Coumadin (Warfarin) is an increased risk of bleeding. You should call your healthcare provider right away if you experience any of the following: ? Bleeding from an injury or your nose that does not stop. ? Unusual colored urine (red or dark brown) or unusual colored stools (red or black). ? Unusual bruising for unknown reasons. ? A serious fall or if you hit your head (even if there is no bleeding).  Some foods or medicines interact  with Coumadin (warfarin) and might alter your response to warfarin. To help avoid this: ? Eat a balanced diet, maintaining a consistent amount of Vitamin K. ? Notify your provider about major diet changes you plan to make. ? Avoid alcohol or limit your intake to 1 drink for women and 2 drinks for men per day. (1 drink is 5 oz. wine, 12 oz. beer, or 1.5 oz. liquor.)  Make sure that ANY health care provider who prescribes medication for you knows that you are taking Coumadin (warfarin).  Also make sure the healthcare provider who is monitoring your Coumadin knows when you have started a new medication including herbals and non-prescription products.  Coumadin (Warfarin)  Major Drug Interactions  Increased Warfarin Effect Decreased Warfarin Effect  Alcohol (large quantities) Antibiotics (esp. Septra/Bactrim, Flagyl, Cipro) Amiodarone (Cordarone) Aspirin (ASA) Cimetidine (Tagamet) Megestrol (Megace) NSAIDs (ibuprofen, naproxen, etc.) Piroxicam (Feldene) Propafenone (Rythmol SR) Propranolol (Inderal) Isoniazid (INH) Posaconazole (Noxafil) Barbiturates (Phenobarbital) Carbamazepine (Tegretol) Chlordiazepoxide (Librium) Cholestyramine (Questran) Griseofulvin Oral Contraceptives Rifampin Sucralfate (Carafate) Vitamin K   Coumadin (Warfarin) Major Herbal Interactions  Increased Warfarin Effect Decreased Warfarin Effect  Garlic Ginseng Ginkgo biloba Coenzyme Q10 Green tea St. Johns wort    Coumadin (Warfarin) FOOD Interactions  Eat a consistent number of servings per week of foods HIGH in Vitamin K (1 serving =  cup)  Collards (cooked, or boiled & drained) Kale (cooked, or boiled & drained) Mustard greens (cooked, or boiled & drained) Parsley *serving size only =  cup Spinach (cooked, or boiled & drained) Swiss chard (cooked, or boiled & drained) Turnip greens (cooked, or boiled & drained)  Eat a consistent number of servings per week of foods MEDIUM-HIGH in Vitamin  K (1 serving = 1 cup)  Asparagus (cooked, or boiled & drained) Broccoli (cooked, boiled & drained, or raw & chopped) Brussel sprouts (cooked, or boiled & drained) *serving size only =  cup Lettuce, raw (green leaf, endive, romaine) Spinach, raw Turnip greens, raw & chopped   These websites have more information on Coumadin (warfarin):  FailFactory.se; VeganReport.com.au;

## 2018-05-23 LAB — BASIC METABOLIC PANEL
Anion gap: 5 (ref 5–15)
BUN: 25 mg/dL — AB (ref 8–23)
CALCIUM: 7.9 mg/dL — AB (ref 8.9–10.3)
CO2: 27 mmol/L (ref 22–32)
Chloride: 103 mmol/L (ref 98–111)
Creatinine, Ser: 1.39 mg/dL — ABNORMAL HIGH (ref 0.61–1.24)
GFR calc Af Amer: 60 mL/min — ABNORMAL LOW (ref >60)
GFR, EST NON AFRICAN AMERICAN: 52 mL/min — AB (ref >60)
GLUCOSE: 109 mg/dL — AB (ref 70–99)
Potassium: 4.8 mmol/L (ref 3.5–5.1)
SODIUM: 135 mmol/L (ref 135–145)

## 2018-05-23 LAB — PROTIME-INR
INR: 1.59
Prothrombin Time: 18.8 seconds — ABNORMAL HIGH (ref 11.4–15.2)

## 2018-05-23 NOTE — Progress Notes (Signed)
Pt ambulating hall with wife. 

## 2018-05-23 NOTE — Progress Notes (Addendum)
      [address]       [place] [address]             [phone]      5 Days Post-Op Procedure(s) (LRB): AORTIC VALVE REPAIR using HAART 300 Aortic Annuloplasty Device size 49mm (N/A) MITRAL VALVE REPAIR (MVR) using 4D Memo Ring size 30 (N/A) MAZE (N/A) TRANSESOPHAGEAL ECHOCARDIOGRAM (TEE) (N/A) CLIPPING OF ATRIAL APPENDAGE using AtriCure Clip size 45   Subjective:  No new complaints.  Sitting up in chair, feels pretty good.  States he notices his swelling has improved.  + ambulation  + BM  Objective: Vital signs in last 24 hours: Temp:  [97.8 F (36.6 C)-98 F (36.7 C)] 97.8 F (36.6 C) (11/10 0355) Pulse Rate:  [62-68] 62 (11/10 0355) Cardiac Rhythm: Bundle branch block;Heart block (11/10 0735) Resp:  [15-20] 15 (11/10 0355) BP: (121-133)/(71-75) 133/73 (11/10 0355) SpO2:  [94 %-95 %] 95 % (11/10 0355) Weight:  [130 kg] 130 kg (11/10 0355)  Intake/Output from previous day: 11/09 0701 - 11/10 0700 In: 480 [P.O.:480] Out: -   General appearance: alert, cooperative and no distress Heart: regular rate and rhythm Lungs: clear to auscultation bilaterally Abdomen: soft, non-tender; bowel sounds normal; no masses,  no organomegaly Extremities: edema trace Wound: clean and dry  Lab Results: Recent Labs    05/21/18 0401  WBC 17.9*  HGB 10.4*  HCT 33.9*  PLT 171   BMET:  Recent Labs    05/22/18 0245 05/23/18 0347  NA 136 135  K 4.9 4.8  CL 104 103  CO2 28 27  GLUCOSE 112* 109*  BUN 28* 25*  CREATININE 1.42* 1.39*  CALCIUM 7.9* 7.9*    PT/INR:  Recent Labs    05/23/18 0347  LABPROT 18.8*  INR 1.59   ABG    Component Value Date/Time   PHART 7.381 05/19/2018 0213   HCO3 19.9 (L) 05/19/2018 0213   TCO2 24 05/19/2018 1716   ACIDBASEDEF 4.0 (H) 05/19/2018 0213   O2SAT 66.4 05/20/2018 0342   CBG (last 3)  Recent Labs    05/21/18 1615 05/21/18 2056 05/22/18 0555  GLUCAP 102* 106* 114*    Assessment/Plan: S/P Procedure(s)  (LRB): AORTIC VALVE REPAIR using HAART 300 Aortic Annuloplasty Device size 51mm (N/A) MITRAL VALVE REPAIR (MVR) using 4D Memo Ring size 30 (N/A) MAZE (N/A) TRANSESOPHAGEAL ECHOCARDIOGRAM (TEE) (N/A) CLIPPING OF ATRIAL APPENDAGE using AtriCure Clip size 45  1. CV- Sinus Loletha Grayer with PVCs- continue to hold BB  2. INR 1.59, continue coumadin at 5 mg daily 3. Pulm- no acute issues, continue IS 4. Renal- creatinine improving, weight is trending down, continue lasix, potassium 5. Dispo- patient stable, Sinus Loletha Grayer with PVCs, continue to hold BB, will d/c EPW today, continue coumadin, if remains stable and no rhythm issues arise will plan to d/c in AM   LOS: 5 days    [name] 05/23/2018 Patient seen and examined, agree with above  Remo Lipps C. Roxan Hockey, MD [facility] [phone]

## 2018-05-23 NOTE — Progress Notes (Signed)
EPWs removed per order and unit protocol.  All tips intact, sites unremarkable, Pt tolerated very well.  VSS, see flowsheet.  Pt and wife understand bedrest for one hr with frequent VS, CCMD notified, will monitor closely.

## 2018-05-24 ENCOUNTER — Inpatient Hospital Stay (HOSPITAL_COMMUNITY): Payer: Medicare HMO

## 2018-05-24 DIAGNOSIS — I350 Nonrheumatic aortic (valve) stenosis: Secondary | ICD-10-CM

## 2018-05-24 LAB — BASIC METABOLIC PANEL
Anion gap: 7 (ref 5–15)
BUN: 22 mg/dL (ref 8–23)
CHLORIDE: 101 mmol/L (ref 98–111)
CO2: 25 mmol/L (ref 22–32)
CREATININE: 1.25 mg/dL — AB (ref 0.61–1.24)
Calcium: 7.9 mg/dL — ABNORMAL LOW (ref 8.9–10.3)
GFR calc non Af Amer: 59 mL/min — ABNORMAL LOW (ref >60)
Glucose, Bld: 105 mg/dL — ABNORMAL HIGH (ref 70–99)
Potassium: 4.5 mmol/L (ref 3.5–5.1)
Sodium: 133 mmol/L — ABNORMAL LOW (ref 135–145)

## 2018-05-24 LAB — ECHOCARDIOGRAM COMPLETE: Weight: 4569.6 oz

## 2018-05-24 LAB — PROTIME-INR
INR: 1.76
Prothrombin Time: 20.3 seconds — ABNORMAL HIGH (ref 11.4–15.2)

## 2018-05-24 MED ORDER — WARFARIN SODIUM 2 MG PO TABS: 4.0000 mg | ORAL_TABLET | Freq: Every day | ORAL | 1 refills | Status: DC

## 2018-05-24 MED ORDER — OXYCODONE HCL 5 MG PO TABS: 5.0000 mg | ORAL_TABLET | Freq: Four times a day (QID) | ORAL | 0 refills | Status: DC | PRN

## 2018-05-24 MED ORDER — TELMISARTAN 20 MG PO TABS: 20.0000 mg | ORAL_TABLET | Freq: Every day | ORAL | 1 refills | Status: DC

## 2018-05-24 MED ORDER — ASPIRIN 81 MG PO TBEC: 81.0000 mg | DELAYED_RELEASE_TABLET | Freq: Every day | ORAL | Status: DC

## 2018-05-24 MED FILL — oxyCODONE HCL 5 MG TABS: 5 | 5 days supply | Qty: 25 | Fill #0

## 2018-05-24 MED FILL — TELMISARTAN 20 MG TABS: 20 | 30 days supply | Qty: 30 | Fill #0

## 2018-05-24 MED FILL — WARFARIN SODIUM 2 MG TABLET: 2 | 30 days supply | Qty: 60 | Fill #0

## 2018-05-24 NOTE — Progress Notes (Signed)
[address]       [place], [address]             [phone]      6 Days Post-Op Procedure(s) (LRB): AORTIC VALVE REPAIR using HAART 300 Aortic Annuloplasty Device size 33mm (N/A) MITRAL VALVE REPAIR (MVR) using 4D Memo Ring size 30 (N/A) MAZE (N/A) TRANSESOPHAGEAL ECHOCARDIOGRAM (TEE) (N/A) CLIPPING OF ATRIAL APPENDAGE using AtriCure Clip size 45 Subjective: Feels well, + BM, ambulating well  Objective: Vital signs in last 24 hours: Temp:  [97.9 F (36.6 C)-98.4 F (36.9 C)] 97.9 F (36.6 C) (11/11 0800) Pulse Rate:  [62-69] 69 (11/11 0350) Cardiac Rhythm: Bundle branch block;Heart block (11/11 0707) Resp:  [14-20] 14 (11/11 0800) BP: (115-158)/(68-88) 142/88 (11/11 0800) SpO2:  [93 %-98 %] 95 % (11/11 0800) Weight:  [129.5 kg] 129.5 kg (11/11 0617)  Hemodynamic parameters for last 24 hours:    Intake/Output from previous day: 11/10 0701 - 11/11 0700 In: 360 [P.O.:360] Out: 1 [Urine:1] Intake/Output this shift: No intake/output data recorded.  General appearance: alert, cooperative and no distress Heart: regular rate and rhythm and no murmur Lungs: clear to auscultation bilaterally Abdomen: benign Extremities: min edema Wound: incis healing well  Lab Results: No results for input(s): WBC, HGB, HCT, PLT in the last 72 hours. BMET:  Recent Labs    05/23/18 0347 05/24/18 0325  NA 135 133*  K 4.8 4.5  CL 103 101  CO2 27 25  GLUCOSE 109* 105*  BUN 25* 22  CREATININE 1.39* 1.25*  CALCIUM 7.9* 7.9*    PT/INR:  Recent Labs    05/24/18 0325  LABPROT 20.3*  INR 1.76   ABG    Component Value Date/Time   PHART 7.381 05/19/2018 0213   HCO3 19.9 (L) 05/19/2018 0213   TCO2 24 05/19/2018 1716   ACIDBASEDEF 4.0 (H) 05/19/2018 0213   O2SAT 66.4 05/20/2018 0342   CBG (last 3)  Recent Labs    05/21/18 1615 05/21/18 2056 05/22/18 0555  GLUCAP 102* 106* 114*    Meds Scheduled Meds: . aspirin EC  81 mg Oral Daily  .  bisacodyl  10 mg Oral Daily   Or  . bisacodyl  10 mg Rectal Daily  . docusate sodium  200 mg Oral Daily  . enoxaparin (LOVENOX) injection  30 mg Subcutaneous Q24H  . furosemide  40 mg Oral BID  . mouth rinse  15 mL Mouth Rinse BID  . pantoprazole  40 mg Oral Daily  . potassium chloride  20 mEq Oral BID  . sodium chloride flush  3 mL Intravenous Q12H  . sodium chloride flush  3 mL Intravenous Q12H  . warfarin  5 mg Oral q1800  . Warfarin - Physician Dosing Inpatient   Does not apply q1800   Continuous Infusions: . sodium chloride    . sodium chloride    . lactated ringers     PRN Meds:.sodium chloride, lactulose, levalbuterol, metoprolol tartrate, morphine injection, ondansetron (ZOFRAN) IV, oxyCODONE, sodium chloride flush, sodium chloride flush, traMADol  Xrays No results found.  Assessment/Plan: S/P Procedure(s) (LRB): AORTIC VALVE REPAIR using HAART 300 Aortic Annuloplasty Device size 12mm (N/A) MITRAL VALVE REPAIR (MVR) using 4D Memo Ring size 30 (N/A) MAZE (N/A) TRANSESOPHAGEAL ECHOCARDIOGRAM (TEE) (N/A) CLIPPING OF ATRIAL APPENDAGE using AtriCure Clip size 45 Plan for discharge: see discharge orders 1 sinus rhythm with BBB- no beta blocker 2 hypertensive at times- Creat conts to improve, will restart micardis at lower  dose for now than home 3 sats good on RA 4 BS adeq controlled  5 INR 1.76- will cont coumadin at 4 mg and check INR midweek   LOS: 6 days    [name] PA-C 05/24/2018 Pager [phone]

## 2018-05-24 NOTE — Progress Notes (Addendum)
CARDIAC REHAB PHASE I   PRE:  Rate/Rhythm: 66 first deg     BP: sitting 142/88    SaO2: 87 RA, 94 RA  MODE:  Ambulation: 470 ft   POST:  Rate/Rhythm: 94 LBBB    BP: sitting 162/86     SaO2: 95 RA  Pt moving well. Walked without major c/o, just pain with deep breaths. SaO2 low sitting but up with deeper breathing. Reminders given for home. [place], ACSM 05/24/2018 8:38 AM   Asked to review ed with wife as she has questions. Good reception. [place] 9:47 AM 05/24/2018

## 2018-05-24 NOTE — Progress Notes (Signed)
Pt has home unit and places himself on and off. Pt knows to call if needed anything.

## 2018-05-24 NOTE — Progress Notes (Signed)
2D Echocardiogram has been performed.  [name] 05/24/2018, 11:23 AM

## 2018-05-26 ENCOUNTER — Other Ambulatory Visit: Payer: Self-pay

## 2018-05-26 ENCOUNTER — Telehealth: Payer: Self-pay

## 2018-05-26 MED ORDER — OXYCODONE HCL 5 MG PO TABS: 5.0000 mg | ORAL_TABLET | Freq: Four times a day (QID) | ORAL | 0 refills | Status: AC | PRN

## 2018-05-26 NOTE — Telephone Encounter (Signed)
Patient contacted the office 2 times within an hour span and stated via voicemail in an angry tone that he was going to continue to call hourly until he got his oxycodone refilled.  He is s/p AVRepair/ MVRepair on 05/19/18 with Dr. Roxy Manns and was discharged from the hospital on 05/24/18.  He stated he was taking 2 tablets every 6 hours and sometimes that was not helping.  Dr. Prescott Gum wrote a new prescription for him that he could take 1 tablet every 6 hours as needed for severe pain.  Also advised that he could take only 2 tablets of Advil/ Aleve daily.  I advised to take one in the morning and one in the evening.  He acknowledged receipt.  When mentioned that he could take only 1 tablet every 6 hours he stated that "well I can tell you right now, taking 2 tablets was barely cutting it".  I advised to take the medications as prescribed.  Patient stated that his wife was coming by the office to pick the medication prescription up.

## 2018-05-27 ENCOUNTER — Telehealth (HOSPITAL_COMMUNITY): Payer: Self-pay

## 2018-05-27 NOTE — Telephone Encounter (Signed)
Pt insurance is active and benefits verified through [facility]. Co-pay $25.00, DED $0.00/$0.00 met, out of pocket $4,200.00/$668.61 met, co-insurance 0%. No pre-authorization required. Passport, 05/27/18 @ 1:55PM, REF# [id number]  Will contact patient to see if he is interested in the Cardiac Rehab Program. If interested, patient will need to complete follow up appt. Once completed, patient will be contacted for scheduling upon review by the RN Navigator.

## 2018-05-27 NOTE — Telephone Encounter (Signed)
Attempted to call patient in regards to Cardiac Rehab - LM on VM 

## 2018-05-28 ENCOUNTER — Ambulatory Visit (INDEPENDENT_AMBULATORY_CARE_PROVIDER_SITE_OTHER): Payer: Medicare HMO | Admitting: Pharmacist

## 2018-05-28 DIAGNOSIS — Z9889 Other specified postprocedural states: Secondary | ICD-10-CM

## 2018-05-28 DIAGNOSIS — Z7901 Long term (current) use of anticoagulants: Secondary | ICD-10-CM | POA: Diagnosis not present

## 2018-05-28 LAB — POCT INR: INR: 2.3 (ref 2.0–3.0)

## 2018-05-31 ENCOUNTER — Other Ambulatory Visit: Payer: Self-pay

## 2018-05-31 ENCOUNTER — Other Ambulatory Visit: Payer: Self-pay | Admitting: *Deleted

## 2018-05-31 ENCOUNTER — Ambulatory Visit (INDEPENDENT_AMBULATORY_CARE_PROVIDER_SITE_OTHER): Payer: Self-pay | Admitting: Surgical

## 2018-05-31 ENCOUNTER — Ambulatory Visit
Admission: RE | Admit: 2018-05-31 | Discharge: 2018-05-31 | Disposition: A | Discharge status: Home / Self Care | Payer: Medicare HMO | Source: Ambulatory Visit | Attending: Thoracic Surgery (Cardiothoracic Vascular Surgery) | Admitting: Thoracic Surgery (Cardiothoracic Vascular Surgery)

## 2018-05-31 ENCOUNTER — Telehealth: Payer: Self-pay | Admitting: *Deleted

## 2018-05-31 VITALS — BP 162/88 | HR 83 | Resp 18 | Ht 70.0 in | Wt 288.2 lb

## 2018-05-31 DIAGNOSIS — R0600 Dyspnea, unspecified: Secondary | ICD-10-CM

## 2018-05-31 DIAGNOSIS — Z954 Presence of other heart-valve replacement: Secondary | ICD-10-CM | POA: Diagnosis not present

## 2018-05-31 DIAGNOSIS — Z8679 Personal history of other diseases of the circulatory system: Secondary | ICD-10-CM

## 2018-05-31 DIAGNOSIS — R0609 Other forms of dyspnea: Principal | ICD-10-CM

## 2018-05-31 DIAGNOSIS — Z9889 Other specified postprocedural states: Secondary | ICD-10-CM

## 2018-05-31 DIAGNOSIS — I509 Heart failure, unspecified: Secondary | ICD-10-CM | POA: Diagnosis not present

## 2018-05-31 MED ORDER — AMIODARONE HCL 200 MG PO TABS: 200.0000 mg | ORAL_TABLET | Freq: Two times a day (BID) | ORAL | 2 refills | Status: DC

## 2018-05-31 MED ORDER — HYDROCODONE-ACETAMINOPHEN 10-325 MG PO TABS: 1.0000 | ORAL_TABLET | Freq: Four times a day (QID) | ORAL | 0 refills | Status: AC | PRN

## 2018-05-31 MED ORDER — POTASSIUM CHLORIDE ER 20 MEQ PO TBCR: 20.0000 meq | EXTENDED_RELEASE_TABLET | Freq: Two times a day (BID) | ORAL | 0 refills | Status: DC

## 2018-05-31 MED ORDER — FUROSEMIDE 40 MG PO TABS: 80.0000 mg | ORAL_TABLET | Freq: Two times a day (BID) | ORAL | 0 refills | Status: DC

## 2018-05-31 NOTE — Telephone Encounter (Signed)
[name] is s/p MVRepair/AVRepair/MAZE on 05/18/18 and discharged on 05/24/18. He was discharged on Oxycodone for pain. He had always had a rash with itching with this medication but was willing to try due to its effectiveness. He has called today to say that after two weeks the itching has started and also he is having a catch in his breathing and getting winded when walking. He said he attributes this to the Oxycodone also and wants to be prescribed Hydrocodone which he knows he can tolerate without reactions.I told him that he needed to get a chest xray prior to that to be sure the respiratory symptoms were not related to the med. In reviewing his last CXR prior to d/c, there were small bilateral pleural effusions that needed to be addressed. He will get a CXR today and come to the office for further instructions from Dr. Roxy Manns, his surgeon.

## 2018-05-31 NOTE — Progress Notes (Signed)
[address]       [place] [address]             [phone]      Grayson E Mestre [place] Medical Record #[id number] Date of Birth: 07-23-1950  Referring: Thompson Grayer, MD Primary Care: Seward Carol, MD Primary Cardiologist: Quay Burow, MD   Chief Complaint:   POST OP FOLLOW UP Date of Procedure:                05/18/2018  Preoperative Diagnosis:        Moderate Aortic Insufficiency  Severe Mitral Regurgitation  Recurrent Persistent Atrial Fibrillation  Postoperative Diagnosis:    Same  Procedure:       Aortic Valve Repair             Complex valvuloplasty including suture plication of prolapsing right coronary leaflet             Biostable HAART aortic ring annuloplasty (size 45mm, ref #[id number], lot #[id number])   Mitral Valve Repair             Complex valvuloplasty including artificial Gore-tex neochord placement x4             Sorin Memo 4D ring annuloplasty (size 8mm, model #4DM-30, serial #[id number])   Maze Procedure              complete bilateral atrial lesion set using bipolar radiofrequency and cryothermy ablation             clipping of left atrial appendage (Atricure Pro245 left atrial clip size 45 mm)               Surgeon:        Valentina Gu. Roxy Manns, MD  Assistant:       [name], PA-C  Anesthesia:    Roderic Palau, MD  Operative Findings: ? Prolapse of right coronary leaflet of aortic valve with type II dysfunction causing moderate-severe aortic insufficiency ? Type I and type II mitral valve dysfunction with annular dilatation and prolapse of middle scallop of anterior leaflet causing severe mitral regurgitation ? Mild left ventricular systolic dysfunction ? No residual aortic insufficiency after successful valve repair ? No residual mitral regurgitation after successful valve repair   History of Present Illness:    The patient is a 67 year old male status post the above described procedure  seen in the office on today's date due to increasing symptoms of shortness of breath and volume overload.  He describes significant orthopnea having to sleep in his recliner.  Due to increasing lower extremity edema over the past week or so.  There was edema present at time of discharge as well.  He also notes that his heart feels out of rhythm.  His most recent INR is 2.3.  He is also having a fair amount of sternotomy discomfort.  He has not had any difficulties with the incisions.  He denies fevers or chills or other significant constitutional symptoms.      Past Medical History:  Diagnosis Date  . Aortic insufficiency 04/21/2018  . Aortic insufficiency   . Arthritis    "joints" (09/26/2013)  . Atrial enlargement, left   . Dyspnea    with exertion  . Hearing aid worn    both ears  . Hypertension   . Mitral regurgitation 04/21/2018  . Obesity   . OSA on CPAP    "mask adjusts to what I need" (09/26/2013)  . Persistent  atrial fibrillation    cardioversion 06/06/13  . PONV (postoperative nausea and vomiting)   . Rotator cuff tear, right dec 2012   physical therapy done, decreased strength  . S/P aortic valve repair 05/18/2018   Complex valvuloplasty including plication of right coronary leaflet and 21 mm Biostable HAART annuloplasty ring  . S/P Maze operation for atrial fibrillation 05/18/2018   Complete bilateral atrial lesion set using bipolar radiofrequency and cryothermy ablation with clipping of LA appendage  . S/P mitral valve repair 05/18/2018   Complex valvuloplasty including artificial Gore-tex neochord placement x4 and 30 mm Sorin Memo 4D ring annuloplasty  . Wears glasses      Social History   Tobacco Use  Smoking Status Never Smoker  Smokeless Tobacco Never Used    Social History   Substance and Sexual Activity  Alcohol Use No     Allergies  Allergen Reactions  . Oxycodone Rash and Other (See Comments)    Can tolerate Hydrocodone    Current Outpatient  Medications  Medication Sig Dispense Refill  . aspirin EC 81 MG EC tablet Take 1 tablet (81 mg total) by mouth daily.    . furosemide (LASIX) 40 MG tablet Take 1 tablet (40 mg total) by mouth daily.    Marland Kitchen KLOR-CON 10 10 MEQ tablet Take 1 tablet (10 mEq total) by mouth daily. Take 1 extra tablet Mon, Wed, Fri (Patient taking differently: Take 10 mEq by mouth daily. ) 48 tablet 6  . oxyCODONE (OXY IR/ROXICODONE) 5 MG immediate release tablet Take 1 tablet (5 mg total) by mouth every 6 (six) hours as needed for up to 7 days for severe pain. 25 tablet 0  . Probiotic Product (PROBIOTIC PO) Take 1 capsule by mouth 3 (three) times a week.     . telmisartan (MICARDIS) 20 MG tablet Take 1 tablet (20 mg total) by mouth daily. 30 tablet 1  . warfarin (COUMADIN) 2 MG tablet Take 2 tablets (4 mg total) by mouth daily at 6 PM. As directed by the coumadin clinic 100 tablet 1   No current facility-administered medications for this visit.        Physical Exam: Ht 5\' 10"  (1.778 m)   BMI 40.98 kg/m   General appearance: alert, cooperative and no distress Heart: irregularly irregular rhythm Lungs: Diminished in lower fields with mild crackles Abdomen: Obese, soft, nontender, positive bowel sounds Extremities: Marked lower extremity pitting edema Wound: Incisions healing well without evidence of infection   Diagnostic Studies & Laboratory data:     Recent Radiology Findings:   Dg Chest 2 View  Result Date: 05/31/2018 CLINICAL DATA:  Followup mitral valve and aortic valve replacements. Shortness of breath. EXAM: CHEST - 2 VIEW COMPARISON:  05/21/2018 FINDINGS: Previous median sternotomy. Chronic cardiomegaly. Pulmonary venous hypertension without frank edema. Moderate bilateral effusions with dependent atelectasis. No acute bone finding. IMPRESSION: Congestive heart failure. Pulmonary venous hypertension. Bilateral effusions with dependent atelectasis. Electronically Signed   By: Nelson Chimes M.D.   On:  05/31/2018 13:29      Recent Lab Findings: Lab Results  Component Value Date   WBC 17.9 (H) 05/21/2018   HGB 10.4 (L) 05/21/2018   HCT 33.9 (L) 05/21/2018   PLT 171 05/21/2018   GLUCOSE 105 (H) 05/24/2018   ALT 48 (H) 05/14/2018   AST 50 (H) 05/14/2018   NA 133 (L) 05/24/2018   K 4.5 05/24/2018   CL 101 05/24/2018   CREATININE 1.25 (H) 05/24/2018   BUN 22 05/24/2018  CO2 25 05/24/2018   TSH 2.097 06/03/2013   INR 2.3 05/28/2018   HGBA1C 5.6 05/14/2018      Assessment / Plan: Acute on  chronic congestive heart failure HFrEF preoperative ejection fraction on TEE estimated at 35%.  Ejection fraction on most recent echo was estimated at 55%.  He is seen in the office today with increased orthopnea and shortness of breath.  Chest x-ray reveals increased congestive failure.  He has notable lower extremity edema significantly increased from at time of discharge.  A 2 channel telemetry strip reveals atrial fibrillation with controlled ventricular response.  Our plan at this point is to give him a prescription for 80 mg twice daily Lasix for the next 3 days and transition to 40 twice daily.  He will be given additional potassium as well.  We will also him on amiodarone 200 mg p.o. twice daily.  I have asked him to inform the Coumadin clinic at the next check that he is now on amiodarone.  He understands this and will discuss that with them.  He also states his oxycodone does not have any effect on his pain and I will change him to a short course of hydrocodone which he says has worked for him in the past.  I gave him prescription for 10 mg tablets #20.  He also notes at home his blood pressure has been more elevated than it was in the hospital with systolic readings in the 106Y to 170s so I have instructed him to increase his Micardis dose to previous level of 80 mg daily.  If he notes that his blood pressures too low he will cut that tablet in half and take 40 mg.  He also keeps records of  this.  We will see him in the office next week as previously scheduled.  Is to contact the office prior to that if he continues to have worsening symptomatology.          [name], PA-C 05/31/2018 2:37 PM

## 2018-05-31 NOTE — Patient Instructions (Signed)
Patient given instructions regarding new Lasix, potassium, amiodarone, hydrocodone  and Micardis dosing.

## 2018-06-02 ENCOUNTER — Encounter (HOSPITAL_COMMUNITY): Payer: Self-pay | Admitting: Cardiology

## 2018-06-04 ENCOUNTER — Ambulatory Visit (INDEPENDENT_AMBULATORY_CARE_PROVIDER_SITE_OTHER): Payer: Medicare HMO | Admitting: Pharmacist Clinician (PhC)/ Clinical Pharmacy Specialist

## 2018-06-04 DIAGNOSIS — I4819 Other persistent atrial fibrillation: Secondary | ICD-10-CM

## 2018-06-04 DIAGNOSIS — Z9889 Other specified postprocedural states: Secondary | ICD-10-CM

## 2018-06-04 DIAGNOSIS — Z7901 Long term (current) use of anticoagulants: Secondary | ICD-10-CM

## 2018-06-04 LAB — POCT INR: INR: 1.5 — AB (ref 2.0–3.0)

## 2018-06-04 NOTE — Patient Instructions (Signed)
Description   Take 3 tablets (6 mg) Friday Nov 22 and Saturday Nov 23, then continue taking 4mg  daily.  Repeat INR Wednesday.

## 2018-06-07 ENCOUNTER — Other Ambulatory Visit: Payer: Self-pay | Admitting: Thoracic Surgery (Cardiothoracic Vascular Surgery)

## 2018-06-07 DIAGNOSIS — Z9889 Other specified postprocedural states: Secondary | ICD-10-CM

## 2018-06-08 ENCOUNTER — Ambulatory Visit: Payer: Self-pay

## 2018-06-09 ENCOUNTER — Ambulatory Visit: Payer: Medicare HMO | Admitting: Thoracic Surgery (Cardiothoracic Vascular Surgery)

## 2018-06-09 ENCOUNTER — Ambulatory Visit: Payer: Medicare HMO | Admitting: Cardiology

## 2018-06-09 ENCOUNTER — Ambulatory Visit (INDEPENDENT_AMBULATORY_CARE_PROVIDER_SITE_OTHER): Payer: Medicare HMO | Admitting: Pharmacist Clinician (PhC)/ Clinical Pharmacy Specialist

## 2018-06-09 DIAGNOSIS — Z7901 Long term (current) use of anticoagulants: Secondary | ICD-10-CM | POA: Diagnosis not present

## 2018-06-09 DIAGNOSIS — Z9889 Other specified postprocedural states: Secondary | ICD-10-CM

## 2018-06-09 LAB — POCT INR: INR: 1.7 — AB (ref 2.0–3.0)

## 2018-06-09 MED ORDER — WARFARIN SODIUM 5 MG PO TABS: 5.0000 mg | ORAL_TABLET | Freq: Every day | ORAL | 3 refills | Status: DC

## 2018-06-09 NOTE — Patient Instructions (Signed)
Description   Increase dose to 3 tablets (6 mg) Mondays, Wednesdays and Fridays, 2 tablets all other days.  Repeat INR in 1 week.  If you run out of the 2 mg tablets, pick up the prescription for 5 mg tablets and take 1 tablet daily

## 2018-06-14 ENCOUNTER — Telehealth: Payer: Self-pay

## 2018-06-14 NOTE — Telephone Encounter (Signed)
Spoke with patient and wife.  Patient was unable to communicate much, as talking seemed to aggravate his coughing.  He was previously on lisinopril, but discontinued in late October due to persistent cough.  Cough resolved after 7-10 days.  He was started on telmisartan 20 mg at that time.  At Nov 18 visit with Jadene Pierini PA this was increase to 80 mg due to elevated pressure.  Coughing started again about 3 days ago.  Per patient and wife, the cough is very similar to previous.  Advised to stop telmisartan for now.  Cough is not usually associated with ARB medications, but has been rarely reported.  He sees Dr. Roxy Manns tomorrow, advised to talk to him and if Dr. Roxy Manns would rather not treat his BP, we can adjust medications when he is here on Thursday for INR check and appt with [facility].  Could consider using spironolactone or hydralazine.

## 2018-06-14 NOTE — Telephone Encounter (Signed)
Called to ask what pt can take for a tickle cough?

## 2018-06-15 ENCOUNTER — Other Ambulatory Visit: Payer: Self-pay | Admitting: *Deleted

## 2018-06-15 ENCOUNTER — Encounter: Payer: Self-pay | Admitting: Thoracic Surgery (Cardiothoracic Vascular Surgery)

## 2018-06-15 ENCOUNTER — Ambulatory Visit (INDEPENDENT_AMBULATORY_CARE_PROVIDER_SITE_OTHER): Payer: Self-pay | Admitting: Thoracic Surgery (Cardiothoracic Vascular Surgery)

## 2018-06-15 ENCOUNTER — Ambulatory Visit
Admission: RE | Admit: 2018-06-15 | Discharge: 2018-06-15 | Disposition: A | Discharge status: Home / Self Care | Payer: Medicare HMO | Source: Ambulatory Visit | Attending: Thoracic Surgery (Cardiothoracic Vascular Surgery) | Admitting: Thoracic Surgery (Cardiothoracic Vascular Surgery)

## 2018-06-15 VITALS — BP 150/86 | HR 88 | Resp 20 | Ht 70.0 in | Wt 270.0 lb

## 2018-06-15 DIAGNOSIS — J90 Pleural effusion, not elsewhere classified: Secondary | ICD-10-CM | POA: Diagnosis not present

## 2018-06-15 DIAGNOSIS — Z9889 Other specified postprocedural states: Secondary | ICD-10-CM

## 2018-06-15 DIAGNOSIS — I340 Nonrheumatic mitral (valve) insufficiency: Secondary | ICD-10-CM

## 2018-06-15 DIAGNOSIS — J9811 Atelectasis: Secondary | ICD-10-CM | POA: Diagnosis not present

## 2018-06-15 DIAGNOSIS — I4819 Other persistent atrial fibrillation: Secondary | ICD-10-CM

## 2018-06-15 DIAGNOSIS — I509 Heart failure, unspecified: Secondary | ICD-10-CM | POA: Diagnosis not present

## 2018-06-15 DIAGNOSIS — I351 Nonrheumatic aortic (valve) insufficiency: Secondary | ICD-10-CM

## 2018-06-15 DIAGNOSIS — Z8679 Personal history of other diseases of the circulatory system: Secondary | ICD-10-CM

## 2018-06-15 MED ORDER — HYDROCODONE-ACETAMINOPHEN 10-325 MG PO TABS: 1.0000 | ORAL_TABLET | Freq: Four times a day (QID) | ORAL | 0 refills | Status: AC | PRN

## 2018-06-15 NOTE — Progress Notes (Signed)
[address]       [place], [id number]             [phone]     CARDIOTHORACIC SURGERY OFFICE NOTE  Referring Provider is Thompson Grayer, MD  Primary Cardiologist is Lorretta Harp, MD PCP is Seward Carol, MD   HPI:  Patient is a 67 year old obese male with history of aortic insufficiency, mitral regurgitation, chronic persistent atrial fibrillation,hypertension,chronic diastolic congestive heart failure, and obstructive sleep apnea who returns to the office today for follow-up of aortic valve repair, mitral valve repair, and Maze procedure on May 18, 2018.  The patient's early postoperative recovery in the hospital was notable for acute exacerbation of chronic diastolic congestive heart failure and volume overload requiring relatively high dose diuretic therapy.  Early on he required temporary pacing for bradycardia although he was eventually discharged from the hospital in sinus rhythm with underlying bundle branch block on the sixth postoperative day.  He was seen in follow-up in our office May 31, 2018 with increased shortness of breath and lower extremity edema.  Lasix was increased at that time.  In addition, at his last appointment the patient's ARB was increased to preoperative dose because of hypertension.  Patient returns to the office today and reports that overall he feels somewhat improved since his last office visit although he complains that he still gets winded with relatively low level of exertion.  His breathing is overall improved and he now can occasionally lay more flat in bed when he is sleeping, but when he is walking up his driveway he frequently still has to stop for rest breaks because of exertional shortness of breath.  His weight is down approximately 16 pounds since his last office visit, but he still has swelling in both lower legs.  After his last office visit he took Lasix 80 mg twice daily for 3 days then cut back to 40 mg  twice daily for 3 days, then he cut back to 40 mg once a day which he continues taking at this time.  Soreness in his chest has improved but he is still using oral narcotic pain relievers at least once or twice daily.  Appetite is good.  He is making an effort to walk more.  He denies any rapid palpitations and states that he has not had any episodes of rapid heart rate since his surgery.  Blood pressure has been stable, although he has not taken his Micardis over the last 2 days because of increasing cough which is exacerbated by laying flat in bed.    Current Outpatient Medications  Medication Sig Dispense Refill  . amiodarone (PACERONE) 200 MG tablet Take 1 tablet (200 mg total) by mouth 2 (two) times daily. 90 tablet 2  . aspirin EC 81 MG EC tablet Take 1 tablet (81 mg total) by mouth daily.    . furosemide (LASIX) 40 MG tablet Take 2 tablets (80 mg total) by mouth 2 (two) times daily. For 3 days, then change to 40 mg BID 120 tablet 0  . potassium chloride 20 MEQ TBCR Take 20 mEq by mouth 2 (two) times daily. For 3 days, then 20 meq once daily 60 tablet 0  . Probiotic Product (PROBIOTIC PO) Take 1 capsule by mouth 3 (three) times a week.     . telmisartan (MICARDIS) 20 MG tablet Take 1 tablet (20 mg total) by mouth daily. 30 tablet 1  . warfarin (COUMADIN) 5 MG tablet  Take 1 tablet (5 mg total) by mouth daily. 30 tablet 3   No current facility-administered medications for this visit.       Physical Exam:   BP (!) 150/86   Pulse 88   Resp 20   Ht 5\' 10"  (1.778 m)   Wt 270 lb (122.5 kg)   SpO2 93% Comment: RA  BMI 38.74 kg/m   General:  Morbidly obese but well-appearing  Chest:   Clear to auscultation with a few inspiratory crackles, breath sounds are symmetrical  CV:   Regular rate and rhythm without murmur  Incisions:  Healing nicely, sternum is stable  Abdomen:  Soft nontender  Extremities:  Warm and well-perfused with mild lower extremity edema  Diagnostic Tests:  2 channel  telemetry rhythm strip performed in our office today demonstrates wide QRS complex with irregular rhythm consistent with likely rate controlled atrial fibrillation and bundle branch block   CHEST - 2 VIEW  COMPARISON:  PA and lateral chest x-ray of May 31, 2018  FINDINGS: The lungs are mildly hypoinflated. There small bilateral pleural effusion similar to those seen previously. There is patchy increased density inferior laterally in the left lung more conspicuous today. The cardiac silhouette is enlarged. A left atrial appendage clip is visible. A prosthetic mitral valve ring is visible. There is mild central pulmonary vascular prominence. There are sternal wires present which appear intact. The retrosternal soft tissues are normal. There is mild multilevel degenerative disc disease. No high-grade compression fracture is observed.  IMPRESSION: Mild CHF with small bilateral pleural effusions. Increased atelectasis or developing pneumonia in the left lower lung. Correlation with any clinical signs of pneumonia is needed. Follow-up radiographs in 2-3 weeks are recommended to assure clearing if it is felt that the patient likely has pneumonia.   Electronically Signed   By: David  Martinique M.D.   On: 06/15/2018 14:51   Impression:  Patient is clinically improved but still making slow progress approximately 1 month status post aortic valve repair, mitral valve repair, and Maze procedure.  He appears to be in rate controlled atrial fibrillation.  He still has signs and symptoms of fluid overload and chronic diastolic congestive heart failure, although he is doing better than he was at his last office visit.  His blood pressure is up a little bit in our office today, but he did not take his Micardis this morning because he was concerned that it might be contributing to his dry cough.  Chest x-ray looks somewhat improved although he still has signs of mild pulmonary vascular  congestion and edema with very small pleural effusions.    Plan:  I have instructed to increase his dose of Lasix again to 80 mg by mouth twice daily for 1 week and possibly decrease it to 40 mg by mouth twice daily at that time.  I also instructed the patient to resume taking Micardis at his previous dose.  He is scheduled for a follow-up appointment at [facility] later this week where his blood pressure and physical exam can be reassessed.  It might be prudent to get a follow-up echocardiogram and some basic blood work at that time.  I have instructed the patient to continue to monitor his blood pressure and weight on a daily basis.  I have encouraged him to continue to gradually increase his physical activity but I have reminded him to refrain from any heavy lifting or strenuous use of his arms or shoulders for at least another 2 months.  I do not think that he is currently ready to go back to driving an automobile, but wants to soreness in his chest has improved to the point where he no longer needs oral narcotic pain relievers he can probably resume driving.  At some point he would benefit from participation in the cardiac rehab program.  Will make sure that a referral has been made.  The patient will return to our office in approximately 6 weeks.  He will call and return sooner should specific problems or questions arise.    Valentina Gu. Roxy Manns, MD 06/15/2018 3:25 PM

## 2018-06-15 NOTE — Patient Instructions (Signed)
Resume lasix 80 mg by mouth twice daily and increase potassium to 20 mEq twice daily  Check your weight on a regular basis and keep a log for your records.  Look for signs of fluid overload such as worsening swelling of your lower legs, increased shortness of breath with activity, and/or a dry nonproductive cough.  Discussed these findings with your cardiologist including whether or not you should adjust your fluid pill dosage (diuretic).  Continue to avoid any heavy lifting or strenuous use of your arms or shoulders for at least a total of three months from the time of surgery.  After three months you may gradually increase how much you lift or otherwise use your arms or chest as tolerated, with limits based upon whether or not activities lead to the return of significant discomfort.  You may return to driving an automobile once you are no longer requiring oral narcotic pain relievers at all.  It would be wise to start driving only short distances during the daylight and gradually increase from there as you feel comfortable.

## 2018-06-17 ENCOUNTER — Ambulatory Visit (INDEPENDENT_AMBULATORY_CARE_PROVIDER_SITE_OTHER): Payer: Medicare HMO | Admitting: Pharmacist

## 2018-06-17 ENCOUNTER — Ambulatory Visit: Payer: Medicare HMO | Admitting: Cardiology

## 2018-06-17 ENCOUNTER — Encounter: Payer: Self-pay | Admitting: Cardiology

## 2018-06-17 VITALS — BP 150/78 | HR 79 | Ht 70.0 in | Wt 263.0 lb

## 2018-06-17 DIAGNOSIS — Z8679 Personal history of other diseases of the circulatory system: Secondary | ICD-10-CM | POA: Diagnosis not present

## 2018-06-17 DIAGNOSIS — E669 Obesity, unspecified: Secondary | ICD-10-CM

## 2018-06-17 DIAGNOSIS — Z9889 Other specified postprocedural states: Secondary | ICD-10-CM

## 2018-06-17 DIAGNOSIS — I4819 Other persistent atrial fibrillation: Secondary | ICD-10-CM

## 2018-06-17 DIAGNOSIS — G4733 Obstructive sleep apnea (adult) (pediatric): Secondary | ICD-10-CM | POA: Diagnosis not present

## 2018-06-17 DIAGNOSIS — I5032 Chronic diastolic (congestive) heart failure: Secondary | ICD-10-CM

## 2018-06-17 DIAGNOSIS — I5043 Acute on chronic combined systolic (congestive) and diastolic (congestive) heart failure: Secondary | ICD-10-CM

## 2018-06-17 DIAGNOSIS — Z7901 Long term (current) use of anticoagulants: Secondary | ICD-10-CM

## 2018-06-17 LAB — POCT INR: INR: 3 (ref 2.0–3.0)

## 2018-06-17 MED ORDER — WARFARIN SODIUM 5 MG PO TABS: ORAL_TABLET | ORAL | 3 refills | Status: DC

## 2018-06-17 NOTE — Assessment & Plan Note (Signed)
BMI 37  

## 2018-06-17 NOTE — Patient Instructions (Signed)
Medication Instructions:   Your physician recommends that you continue on your current medications as directed. Please refer to the Current Medication list given to you today.  If you need a refill on your cardiac medications before your next appointment, please call your pharmacy.   Lab work:  BMP BNP AND CBC    If you have labs (blood work) drawn today and your tests are completely normal, you will receive your results only by: Marland Kitchen MyChart Message (if you have MyChart) OR . A paper copy in the mail If you have any lab test that is abnormal or we need to change your treatment, we will call you to review the results.   Testing/Procedures:   Follow-Up:  IN 2 WEEKS WITH DR BERRY  OR Rosalyn Gess OR  WITH K[place] ON A DAY DR BERRY IN OFFICE     Any Other Special Instructions Will Be Listed Below (If Applicable).

## 2018-06-17 NOTE — Assessment & Plan Note (Signed)
Coumadin Rx 

## 2018-06-17 NOTE — Progress Notes (Signed)
06/17/2018 [name]   1950/09/15  [id number]  Primary Physician Seward Carol, MD Primary Cardiologist: Dr Gwenlyn Found- Dr Rayann Heman- EP  HPI:  Pleasnt 67 year old obese male with history of chronic atrial fibrillation,hypertension,chronic diastolic congestive heart failure, and obstructive sleep apnea who is seen today post AV repair, MV repair, and MAZE on 05/18/18.  The patient states that he was first diagnosed with atrial fibrillation approximately 5 years ago. He was initially evaluated by Dr. Gwenlyn Found and eventually referred to Dr. Rayann Heman for management on his AF. He was managed with Tikosyn successfully for approximately 2 years, but he then began to develop recurrent episodes of paroxysmal atrial fibrillation that increased in frequency and duration. He eventually developed persistent atrial fibrillation and underwent cardioversion on 2 occasions, in 2014 and in 2015.Echocardiogram performed in April 2018 revealed normal left ventricular systolic function with moderate left ventricular hypertrophy. There was mild aortic insufficiency, mild mitral regurgitation, and severe left atrial enlargement reported. He has remained in persistent atrial fibrillation since last fall without any spontaneous recurrence to sinus rhythm.  He was seen in follow-up recently by Dr. Rayann Heman and long-term treatment options were discussed. Catheter based ablation was discussed as an alternative but felt likely to be associated with diminished long-term success because of severe left atrial enlargement noted on echocardiogram in the setting of obesity with obstructive sleep apnea. Cardiothoracic surgical consultation was requested to discuss surgical options including the Maze procedure.  The patient was admitted for right and left heart cath done 04/21/18 showed normal coronaries. TEE 04/21/18 showed an EF of 35-40% with moderate to severe AR and MR.   On 05/18/18 the patient had AOV repair, MV repair, and  MAZE. He was seen in f/u by [facility] 05/31/18 and was in CHF. His Lasix was increased as well as his Micardis, and his pain medication. He was seen in follow up 06/15/18 by Dr Roxy Manns and was improved. His weight was down approximately 16 pounds since his last office visit, but he still had swelling in both lower legs.  After his last office visit he took Lasix 80 mg twice daily for 3 days then cut back to 40 mg twice daily for 3 days, then he cut back to 40 mg once a day.  Dr Roxy Manns instructed the patient to increase his dose of Lasix again to 80 mg by mouth twice daily for 1 week and possibly decrease it to 40 mg by mouth twice daily.  He is in the office today for follow up. He continues to improve though he still complains of a persistent cough.  He was able to walk his driveway today.  His weight is down from 288 lbs on Nov 18 to 263 lbs today.  He still has some (trace) ankle edema.  He has been sleeping in a recliner but feels like he is going to try to sleep in his bed tonight.  He does have sleep apnea and says he is compliant with CPAP.  His main complaint really is just this persistent cough.  His primary care provider had taken him off lisinopril previously and placed him on Micardis.  The patient tells me that his cough is actually gotten better just before surgery.  It started around the time of his TEE in October.    Current Outpatient Medications  Medication Sig Dispense Refill  . amiodarone (PACERONE) 200 MG tablet Take 1 tablet (200 mg total) by mouth 2 (two) times daily. 90 tablet 2  . aspirin EC  81 MG EC tablet Take 1 tablet (81 mg total) by mouth daily.    . furosemide (LASIX) 40 MG tablet Take 2 tablets (80 mg total) by mouth 2 (two) times daily. For 3 days, then change to 40 mg BID 120 tablet 0  . HYDROcodone-acetaminophen (NORCO) 10-325 MG tablet Take 1 tablet by mouth every 6 (six) hours as needed for up to 7 days. 30 tablet 0  . potassium chloride 20 MEQ TBCR Take 20 mEq by mouth 2 (two)  times daily. For 3 days, then 20 meq once daily 60 tablet 0  . Probiotic Product (PROBIOTIC PO) Take 1 capsule by mouth 3 (three) times a week.     . telmisartan (MICARDIS) 80 MG tablet Take 80 mg by mouth daily.    Marland Kitchen warfarin (COUMADIN) 5 MG tablet Take 1 tablet (5 mg total) by mouth daily. 30 tablet 3   No current facility-administered medications for this visit.     Allergies  Allergen Reactions  . Oxycodone Rash and Other (See Comments)    Can tolerate Hydrocodone    Past Medical History:  Diagnosis Date  . Aortic insufficiency 04/21/2018  . Aortic insufficiency   . Arthritis    "joints" (09/26/2013)  . Atrial enlargement, left   . Dyspnea    with exertion  . Hearing aid worn    both ears  . Hypertension   . Mitral regurgitation 04/21/2018  . Obesity   . OSA on CPAP    "mask adjusts to what I need" (09/26/2013)  . Persistent atrial fibrillation    cardioversion 06/06/13  . PONV (postoperative nausea and vomiting)   . Rotator cuff tear, right dec 2012   physical therapy done, decreased strength  . S/P aortic valve repair 05/18/2018   Complex valvuloplasty including plication of right coronary leaflet and 21 mm Biostable HAART annuloplasty ring  . S/P Maze operation for atrial fibrillation 05/18/2018   Complete bilateral atrial lesion set using bipolar radiofrequency and cryothermy ablation with clipping of LA appendage  . S/P mitral valve repair 05/18/2018   Complex valvuloplasty including artificial Gore-tex neochord placement x4 and 30 mm Sorin Memo 4D ring annuloplasty  . Wears glasses     Social History   Socioeconomic History  . Marital status: Married    Spouse name: Not on file  . Number of children: Not on file  . Years of education: Not on file  . Highest education level: Not on file  Occupational History  . Occupation: Agricultural consultant  Social Needs  . Financial resource strain: Not on file  . Food insecurity:    Worry: Not on  file    Inability: Not on file  . Transportation needs:    Medical: Not on file    Non-medical: Not on file  Tobacco Use  . Smoking status: Never Smoker  . Smokeless tobacco: Never Used  Substance and Sexual Activity  . Alcohol use: No  . Drug use: No  . Sexual activity: Yes  Lifestyle  . Physical activity:    Days per week: Not on file    Minutes per session: Not on file  . Stress: Not on file  Relationships  . Social connections:    Talks on phone: Not on file    Gets together: Not on file    Attends religious service: Not on file    Active member of club or organization: Not on file    Attends meetings of clubs or organizations:  Not on file    Relationship status: Not on file  . Intimate partner violence:    Fear of current or ex partner: Not on file    Emotionally abused: Not on file    Physically abused: Not on file    Forced sexual activity: Not on file  Other Topics Concern  . Not on file  Social History Narrative   Pt lives in [place] with spouse.  Leadership training and behavioral safety training.     Family History  Problem Relation Age of Onset  . Other Unknown        mother had a pacemaker     Review of Systems: General: negative for chills, fever, night sweats or weight changes.  Cardiovascular: negative for palpitations, paroxysmal nocturnal dyspnea Dermatological: negative for rash Respiratory: negative for cough or wheezing Urologic: negative for hematuria Abdominal: negative for nausea, vomiting, diarrhea, bright red blood per rectum, melena, or hematemesis Neurologic: negative for visual changes, syncope, or dizziness All other systems reviewed and are otherwise negative except as noted above.    Blood pressure (!) 150/78, pulse 79, height 5\' 10"  (1.778 m), weight 263 lb (119.3 kg).  General appearance: alert, cooperative, no distress and morbidly obese Lungs: decreased at bases Heart: irregularly irregular rhythm Extremities: trace LE  edema Skin: warm and dry Neurologic: Grossly normal  EKG AF with VR 78, IVCD  ASSESSMENT AND PLAN:   Acute on chronic combined systolic (congestive) and diastolic (congestive) heart failure (HCC) His weight is down almost 25 pounds.  By history he seems to be improving though he still complains of a cough and shortness of breath and dyspnea on exertion.  Atrial fibrillation, persistent Status post Maze procedure.  He was discharged on amiodarone and Coumadin.  He remains in atrial fibrillation. As of today his rate is controlled.  Long term (current) use of anticoagulants Coumadin Rx  S/P aortic valve repair + mitral valve repair + maze procedure S/P AV repair and MV repair 05/18/18  Obesity (BMI 30-39.9) BMI 37  Obstructive sleep apnea Reports compliance with C-pap   PLAN  Continue same dose Lasix for now. Check BMP, BNP, CBC.  We'll see him back in 1-2 weeks.  Consider adjusting his diuretics based on his labs, including the addition of Aldactone.  If his cough doesn't improve we may have to take him off Micardis and se if that helps.   Kerin Ransom PA-C 06/17/2018 3:32 PM

## 2018-06-17 NOTE — Assessment & Plan Note (Signed)
His weight is down almost 25 pounds.  By history he seems to be improving though he still complains of a cough and shortness of breath and dyspnea on exertion.

## 2018-06-17 NOTE — Assessment & Plan Note (Signed)
Reports compliance with C-pap 

## 2018-06-17 NOTE — Assessment & Plan Note (Signed)
S/P AV repair and MV repair 05/18/18

## 2018-06-17 NOTE — Assessment & Plan Note (Signed)
Status post Maze procedure.  He was discharged on amiodarone and Coumadin.  He remains in atrial fibrillation. As of today his rate is controlled.

## 2018-06-18 LAB — BASIC METABOLIC PANEL
BUN/Creatinine Ratio: 19 (ref 10–24)
BUN: 20 mg/dL (ref 8–27)
CO2: 21 mmol/L (ref 20–29)
Calcium: 9.1 mg/dL (ref 8.6–10.2)
Chloride: 99 mmol/L (ref 96–106)
Creatinine, Ser: 1.04 mg/dL (ref 0.76–1.27)
GFR calc Af Amer: 87 mL/min/{1.73_m2} (ref >59)
GFR calc non Af Amer: 75 mL/min/{1.73_m2} (ref >59)
Glucose: 100 mg/dL — ABNORMAL HIGH (ref 65–99)
Potassium: 3.7 mmol/L (ref 3.5–5.2)
Sodium: 139 mmol/L (ref 134–144)

## 2018-06-18 LAB — CBC
Hematocrit: 35.3 % — ABNORMAL LOW (ref 37.5–51.0)
Hemoglobin: 12 g/dL — ABNORMAL LOW (ref 13.0–17.7)
MCH: 27.1 pg (ref 26.6–33.0)
MCHC: 34 g/dL (ref 31.5–35.7)
MCV: 80 fL (ref 79–97)
Platelets: 369 10*3/uL (ref 150–450)
RBC: 4.43 x10E6/uL (ref 4.14–5.80)
RDW: 14.3 % (ref 12.3–15.4)
WBC: 11.1 10*3/uL — ABNORMAL HIGH (ref 3.4–10.8)

## 2018-06-18 LAB — BRAIN NATRIURETIC PEPTIDE: BNP: 233.1 pg/mL — ABNORMAL HIGH (ref 0.0–100.0)

## 2018-06-21 ENCOUNTER — Ambulatory Visit: Payer: Medicare HMO | Admitting: Thoracic Surgery (Cardiothoracic Vascular Surgery)

## 2018-06-21 NOTE — Telephone Encounter (Signed)
Called patient to see if he was interested in participating in the Cardiac Rehab Program. Patient stated yes. Patient will come in for orientation on 08/05/2018 @ 830AM and will attend the 815AM exercise class.  Mailed homework package.  Went over insurance, patient verbalized understanding.

## 2018-06-23 ENCOUNTER — Other Ambulatory Visit: Payer: Self-pay

## 2018-06-23 MED ORDER — SPIRONOLACTONE 25 MG PO TABS: 25.0000 mg | ORAL_TABLET | Freq: Every day | ORAL | 1 refills | Status: DC

## 2018-06-25 ENCOUNTER — Telehealth: Payer: Self-pay

## 2018-06-25 NOTE — Telephone Encounter (Signed)
Mr. [name] contacted the office concerned about a medication, Spironolactone.  He stated that his pharmacy would not release the medication "due to it interacting with other medications".  He stated that he contacted his Cardiologist office on Tuesday of this week and has not heard from them. I advised Mr. [name] to contact his Cardiologist again in regards to not taking the medication.  He acknowledged receipt.

## 2018-06-30 ENCOUNTER — Ambulatory Visit: Payer: Medicare HMO | Admitting: Cardiovascular Disease

## 2018-06-30 ENCOUNTER — Encounter: Payer: Self-pay | Admitting: Cardiovascular Disease

## 2018-06-30 ENCOUNTER — Ambulatory Visit (INDEPENDENT_AMBULATORY_CARE_PROVIDER_SITE_OTHER): Payer: Medicare HMO | Admitting: Pharmacist

## 2018-06-30 VITALS — BP 134/72 | HR 88 | Ht 69.5 in | Wt 252.0 lb

## 2018-06-30 DIAGNOSIS — Z7901 Long term (current) use of anticoagulants: Secondary | ICD-10-CM

## 2018-06-30 DIAGNOSIS — I110 Hypertensive heart disease with heart failure: Secondary | ICD-10-CM

## 2018-06-30 DIAGNOSIS — Z9889 Other specified postprocedural states: Secondary | ICD-10-CM

## 2018-06-30 DIAGNOSIS — I4819 Other persistent atrial fibrillation: Secondary | ICD-10-CM

## 2018-06-30 DIAGNOSIS — R059 Cough, unspecified: Secondary | ICD-10-CM

## 2018-06-30 DIAGNOSIS — R05 Cough: Secondary | ICD-10-CM | POA: Diagnosis not present

## 2018-06-30 DIAGNOSIS — G4733 Obstructive sleep apnea (adult) (pediatric): Secondary | ICD-10-CM

## 2018-06-30 LAB — POCT INR: INR: 2.9 (ref 2.0–3.0)

## 2018-06-30 NOTE — Assessment & Plan Note (Signed)
History of obstructive sleep apnea on CPAP. 

## 2018-06-30 NOTE — Patient Instructions (Signed)
Medication Instructions:  Your physician recommends that you continue on your current medications as directed. Please refer to the Current Medication list given to you today.  If you need a refill on your cardiac medications before your next appointment, please call your pharmacy.   Lab work: NONE If you have labs (blood work) drawn today and your tests are completely normal, you will receive your results only by: Marland Kitchen MyChart Message (if you have MyChart) OR . A paper copy in the mail If you have any lab test that is abnormal or we need to change your treatment, we will call you to review the results.  Testing/Procedures: A chest x-ray takes a picture of the organs and structures inside the chest, including the heart, lungs, and blood vessels. This test can show several things, including, whether the heart is enlarges; whether fluid is building up in the lungs; and whether pacemaker / defibrillator leads are still in place.   Follow-Up: At [facility], you and your health needs are our priority.  As part of our continuing mission to provide you with exceptional heart care, we have created designated Provider Care Teams.  These Care Teams include your primary Cardiologist (physician) and Advanced Practice Providers (APPs -  Physician Assistants and Nurse Practitioners) who all work together to provide you with the care you need, when you need it. You will need a follow up appointment in 3 months WITH DR. Gwenlyn Found.

## 2018-06-30 NOTE — Assessment & Plan Note (Addendum)
History of essential hypertension with blood pressure measured today at 134/72.

## 2018-06-30 NOTE — Assessment & Plan Note (Signed)
Status post mitral valve repair by Dr. Roxy Manns 05/18/2018 with a mitral annuloplasty ring and Gore-Tex neo-cord replacement x4.

## 2018-06-30 NOTE — Assessment & Plan Note (Signed)
History of persistent atrial fibrillation status post recent surgical maze procedure by Dr. Roxy Manns in and out of A. fib on Coumadin anticoagulation.

## 2018-06-30 NOTE — Assessment & Plan Note (Signed)
Status post aortic valve repair with a aortic annuloplasty ring.  Follow-up echo shows no evidence of MR or AI.

## 2018-06-30 NOTE — Progress Notes (Signed)
06/30/2018 [name]   Nov 16, 1950  [id number]  Primary Physician Seward Carol, MD Primary Cardiologist: Lorretta Harp MD Lupe Carney, [place]  HPI:  [name] is a 67 y.o.  severely overweight married Caucasian male father of 67, grandfather to 3 grandchildren works as a Arts development officer. Hewasreferred by Dr. Maudry Mayhew for evaluation of new-onset atrial fibrillation.I last saw him in the office  05/11/2018.His factors include hypertension. He has obstructive sleep apnea on CPAP. He has had 3right hip replacements in the past. He's never had a heart attack or stroke he denies chest pain or shortness of breath. Dr. Delfina Redwood began him on low-dose beta blockade. I began him on Xarelto oral anticoagulation. He'll ultimately underwent outpatient direct current cardioversion by Dr. Sallyanne Kuster with 1 shock of 120 J successfully to sinus rhythm. He ultimately reverted back to atrial fibrillation. A 2-D echocardiogram revealed normal LV systolic function with moderate to severe left atrial lodgment. A Myoview stress test suggested inferior scar without ischemia. He has seen Dr. Rayann Heman over the last 5 years and has been on Tikosyn in the past without benefit.   He was admitted to the hospital for 1 day 04/21/2018 for right left heart cath and TEE.  This showed normal coronary arteries, elevated filling pressures and wedge pressure.  The TEE did show moderate to severe MR and AI with an EF of 35 to 40%.   He underwent aortic and mitral valve repair as well as left atrial surgical maze procedure by Dr. Roxy Manns 05/18/2018 with an excellent result.  He is still in and out of A. fib on amiodarone and Coumadin anticoagulation.  His postop echo performed 05/24/2018 revealed normal LV size and function with mild LVH and a well-functioning aortic and mitral valve repair.  His major complaint now is a persistent cough.  Apparently he had some atelectasis and fluid collection on a  chest x-ray performed 06/15/2018.  The report mentions a possibility of pneumonia but I do not think this is this is a clinical possibility.   Current Meds  Medication Sig  . amiodarone (PACERONE) 200 MG tablet Take 1 tablet (200 mg total) by mouth 2 (two) times daily.  Marland Kitchen aspirin EC 81 MG EC tablet Take 1 tablet (81 mg total) by mouth daily.  . furosemide (LASIX) 40 MG tablet Take 2 tablets (80 mg total) by mouth 2 (two) times daily. For 3 days, then change to 40 mg BID  . potassium chloride 20 MEQ TBCR Take 20 mEq by mouth 2 (two) times daily. For 3 days, then 20 meq once daily  . Probiotic Product (PROBIOTIC PO) Take 1 capsule by mouth 3 (three) times a week.   . spironolactone (ALDACTONE) 25 MG tablet Take 1 tablet (25 mg total) by mouth daily.  Marland Kitchen telmisartan (MICARDIS) 80 MG tablet Take 80 mg by mouth daily.  Marland Kitchen warfarin (COUMADIN) 5 MG tablet Take 1/2 to 1 tablets daily as directed by coumadin clinic     Allergies  Allergen Reactions  . Oxycodone Rash and Other (See Comments)    Can tolerate Hydrocodone    Social History   Socioeconomic History  . Marital status: Married    Spouse name: Not on file  . Number of children: Not on file  . Years of education: Not on file  . Highest education level: Not on file  Occupational History  . Occupation: Agricultural consultant  Social Needs  . Financial resource strain:  Not on file  . Food insecurity:    Worry: Not on file    Inability: Not on file  . Transportation needs:    Medical: Not on file    Non-medical: Not on file  Tobacco Use  . Smoking status: Never Smoker  . Smokeless tobacco: Never Used  Substance and Sexual Activity  . Alcohol use: No  . Drug use: No  . Sexual activity: Yes  Lifestyle  . Physical activity:    Days per week: Not on file    Minutes per session: Not on file  . Stress: Not on file  Relationships  . Social connections:    Talks on phone: Not on file    Gets together: Not on file     Attends religious service: Not on file    Active member of club or organization: Not on file    Attends meetings of clubs or organizations: Not on file    Relationship status: Not on file  . Intimate partner violence:    Fear of current or ex partner: Not on file    Emotionally abused: Not on file    Physically abused: Not on file    Forced sexual activity: Not on file  Other Topics Concern  . Not on file  Social History Narrative   Pt lives in [place] with spouse.  Leadership training and behavioral safety training.     Review of Systems: General: negative for chills, fever, night sweats or weight changes.  Cardiovascular: negative for chest pain, dyspnea on exertion, edema, orthopnea, palpitations, paroxysmal nocturnal dyspnea or shortness of breath Dermatological: negative for rash Respiratory: negative for cough or wheezing Urologic: negative for hematuria Abdominal: negative for nausea, vomiting, diarrhea, bright red blood per rectum, melena, or hematemesis Neurologic: negative for visual changes, syncope, or dizziness All other systems reviewed and are otherwise negative except as noted above.    Blood pressure 134/72, pulse 88, height 5' 9.5" (1.765 m), weight 252 lb (114.3 kg).  General appearance: alert and no distress Neck: no adenopathy, no carotid bruit, no JVD, supple, symmetrical, trachea midline and thyroid not enlarged, symmetric, no tenderness/mass/nodules Lungs: clear to auscultation bilaterally Heart: regular rate and rhythm, S1, S2 normal, no murmur, click, rub or gallop Extremities: extremities normal, atraumatic, no cyanosis or edema Pulses: 2+ and symmetric Skin: Skin color, texture, turgor normal. No rashes or lesions Neurologic: Alert and oriented X 3, normal strength and tone. Normal symmetric reflexes. Normal coordination and gait  EKG not performed today  ASSESSMENT AND PLAN:   Obstructive sleep apnea History of obstructive sleep apnea on  CPAP  Atrial fibrillation, persistent History of persistent atrial fibrillation status post recent surgical maze procedure by Dr. Roxy Manns in and out of A. fib on Coumadin anticoagulation.  Hypertensive cardiovascular disease History of essential hypertension with blood pressure measured today at 134/72.    Chronic diastolic congestive heart failure (HCC) History of chronic diastolic heart failure on twice daily Lasix with mild peripheral edema.  And we will recheck a PA and lateral chest x-ray given his mild fluid accumulation by chest x-ray on 06/15/2018.  Mitral regurgitation Status post mitral valve repair by Dr. Roxy Manns 05/18/2018 with a mitral annuloplasty ring and [employer] neo-cord replacement x4.  S/P aortic valve repair + mitral valve repair + maze procedure Status post aortic valve repair with a aortic annuloplasty ring.  Follow-up echo shows no evidence of MR or AI.      Lorretta Harp MD FACP,FACC,FAHA, [facility] 06/30/2018 12:47  PM

## 2018-06-30 NOTE — Assessment & Plan Note (Signed)
History of chronic diastolic heart failure on twice daily Lasix with mild peripheral edema.  And we will recheck a PA and lateral chest x-ray given his mild fluid accumulation by chest x-ray on 06/15/2018.

## 2018-07-01 ENCOUNTER — Ambulatory Visit
Admission: RE | Admit: 2018-07-01 | Discharge: 2018-07-01 | Disposition: A | Discharge status: Home / Self Care | Payer: Medicare HMO | Source: Ambulatory Visit | Attending: Cardiovascular Disease | Admitting: Cardiovascular Disease

## 2018-07-01 DIAGNOSIS — R05 Cough: Secondary | ICD-10-CM

## 2018-07-01 DIAGNOSIS — R059 Cough, unspecified: Secondary | ICD-10-CM

## 2018-07-01 DIAGNOSIS — J90 Pleural effusion, not elsewhere classified: Secondary | ICD-10-CM | POA: Diagnosis not present

## 2018-07-02 DIAGNOSIS — Z9889 Other specified postprocedural states: Secondary | ICD-10-CM | POA: Diagnosis not present

## 2018-07-02 DIAGNOSIS — Z952 Presence of prosthetic heart valve: Secondary | ICD-10-CM | POA: Diagnosis not present

## 2018-07-05 ENCOUNTER — Telehealth: Payer: Self-pay

## 2018-07-05 NOTE — Telephone Encounter (Signed)
-----   Message from Rexene Alberts, MD sent at 07/03/2018 11:30 AM EST ----- Regarding: RE: Driving He may start driving as long as he is not hurting too much and no longer needing oral narcotic pain relievers during the daytime ----- Message ----- From: Marylen Ponto, LPN Sent: 14/43/1540   2:31 PM EST To: Rexene Alberts, MD Subject: Driving                                        [name] stopped by the office today requesting an OK to go ahead and start driving. He stated that he has seen Dr Gwenlyn Found and Dr Delfina Redwood, they both said he is doing well, all the swelling is down. He is scheduled to see you 08/04/2018 but does not want to wait that long. Can you review Dr Kennon Holter note and advise on driving Thanks SW

## 2018-07-16 ENCOUNTER — Telehealth: Payer: Self-pay

## 2018-07-16 NOTE — Telephone Encounter (Signed)
[name] contacted the office to state that his heart rate which usually runs 68 is now 80.  He was worried this was too high and wanted something done about it.  I advised him that a normal heart rate is anywhere between 60-100.  With his being 30, I advised that this is normal.  When asked if his pulse was regular, he stated no and he knew that he was not in normal rhythm, but I advised that he was at a controlled rate.  I did also advised that if he felt something needed to be done about it he could contact his Cardiologist for answers.  He acknowledged receipt.

## 2018-07-20 ENCOUNTER — Ambulatory Visit (INDEPENDENT_AMBULATORY_CARE_PROVIDER_SITE_OTHER): Payer: Medicare HMO | Admitting: Pharmacist

## 2018-07-20 DIAGNOSIS — Z9889 Other specified postprocedural states: Secondary | ICD-10-CM

## 2018-07-20 DIAGNOSIS — Z7901 Long term (current) use of anticoagulants: Secondary | ICD-10-CM | POA: Diagnosis not present

## 2018-07-20 LAB — POCT INR: INR: 2.2 (ref 2.0–3.0)

## 2018-07-30 ENCOUNTER — Telehealth (HOSPITAL_COMMUNITY): Payer: Self-pay

## 2018-08-02 ENCOUNTER — Other Ambulatory Visit: Payer: Self-pay

## 2018-08-02 ENCOUNTER — Ambulatory Visit (INDEPENDENT_AMBULATORY_CARE_PROVIDER_SITE_OTHER): Payer: Self-pay | Admitting: Thoracic Surgery (Cardiothoracic Vascular Surgery)

## 2018-08-02 ENCOUNTER — Other Ambulatory Visit: Payer: Self-pay | Admitting: Surgical

## 2018-08-02 ENCOUNTER — Encounter: Payer: Self-pay | Admitting: Thoracic Surgery (Cardiothoracic Vascular Surgery)

## 2018-08-02 VITALS — BP 146/90 | HR 83 | Resp 18 | Ht 69.5 in | Wt 251.8 lb

## 2018-08-02 DIAGNOSIS — Z9889 Other specified postprocedural states: Secondary | ICD-10-CM

## 2018-08-02 DIAGNOSIS — I4819 Other persistent atrial fibrillation: Secondary | ICD-10-CM

## 2018-08-02 DIAGNOSIS — Z8679 Personal history of other diseases of the circulatory system: Secondary | ICD-10-CM

## 2018-08-02 MED ORDER — AMIODARONE HCL 200 MG PO TABS: 200.0000 mg | ORAL_TABLET | Freq: Every day | ORAL | 2 refills | Status: DC

## 2018-08-02 NOTE — Patient Instructions (Addendum)
Decrease amiodarone to 200 mg daily.  Continue all other previous medications without any changes at this time  You may resume unrestricted physical activity without any particular limitations at this time.  Monitor your blood pressure regularly and discuss whether or not your blood pressure medications should be adjusted with your cardiologist and/or primary care physician, particularly if your systolic blood pressure consistently measures > 140 mmHg and/or your diastolic blood pressure measures > 90 mmHg  Check your weight on a regular basis and keep a log for your records.  Look for signs of fluid overload such as worsening swelling of your lower legs, increased shortness of breath with activity, and/or a dry nonproductive cough.  Discussed these findings with your cardiologist including whether or not you should adjust your fluid pill dosage (diuretic).  You have been advised of the numerous benefits associated with regular exercise and weight loss.  The long term benefits for your overall health and wellbeing cannot be overestimated.  Endocarditis is a potentially serious infection of heart valves or inside lining of the heart.  It occurs more commonly in patients with diseased heart valves (such as patient's with aortic or mitral valve disease) and in patients who have undergone heart valve repair or replacement.  Certain surgical and dental procedures may put you at risk, such as dental cleaning, other dental procedures, or any surgery involving the respiratory, urinary, gastrointestinal tract, gallbladder or prostate gland.   To minimize your chances for develooping endocarditis, maintain good oral health and seek prompt medical attention for any infections involving the mouth, teeth, gums, skin or urinary tract.    Always notify your doctor or dentist about your underlying heart valve condition before having any invasive procedures. You will need to take antibiotics before certain  procedures, including all routine dental cleanings or other dental procedures.  Your cardiologist or dentist should prescribe these antibiotics for you to be taken ahead of time.

## 2018-08-02 NOTE — Progress Notes (Signed)
[address]       [place] [address]             [phone]     CARDIOTHORACIC SURGERY OFFICE NOTE  Referring Provider is Thompson Grayer, MD  Primary Cardiologist is Lorretta Harp, MD PCP is Seward Carol, MD   HPI:  Patient is a 68 year old obese male with history of aortic insufficiency, mitral regurgitation, chronic persistent atrial fibrillation,hypertension,chronic diastolic congestive heart failure, and obstructive sleep apnea who returns to the office today for follow-up of aortic valve repair, mitral valve repair, and Maze procedure on May 18, 2018.  He was last seen here in our office at which time he was making slow progress and still struggling with management of volume overload and chronic diastolic congestive heart failure.  Since then he has been seen on one occasion last month by Dr. Gwenlyn Found.  He was in atrial fibrillation at that time but clinically doing well.  He returns her office today and reports that he is feeling much better.  He has slowly been losing a little bit of weight.  He is making an effort to exercise on a regular basis.  He continues to take Lasix 80 mg twice daily.  He is back on telmisartan.  He remains anticoagulated using warfarin.  He has not had any palpitations whatsoever and he notes that he can no longer tell whether or not he is in atrial fibrillation unless he looks at his apple watch which he believes tells him that he is going in and out of sinus rhythm.  He is back starting to exercise at his local gym and he wants to start pushing his physical activity more.  He has decided not to participate in the cardiac rehab program because of the expensive co-pay.   Current Outpatient Medications  Medication Sig Dispense Refill  . aspirin EC 81 MG EC tablet Take 1 tablet (81 mg total) by mouth daily.    . potassium chloride 20 MEQ TBCR Take 20 mEq by mouth 2 (two) times daily. For 3 days, then 20 meq once daily 60 tablet 0    . Probiotic Product (PROBIOTIC PO) Take 1 capsule by mouth 3 (three) times a week.     . telmisartan (MICARDIS) 80 MG tablet Take 80 mg by mouth daily.    Marland Kitchen warfarin (COUMADIN) 5 MG tablet Take 1/2 to 1 tablets daily as directed by coumadin clinic 30 tablet 3  . amiodarone (PACERONE) 200 MG tablet Take 1 tablet (200 mg total) by mouth 2 (two) times daily. 90 tablet 2  . furosemide (LASIX) 40 MG tablet Take 2 tablets (80 mg total) by mouth 2 (two) times daily. For 3 days, then change to 40 mg BID 120 tablet 0   No current facility-administered medications for this visit.       Physical Exam:   BP (!) 146/90 (BP Location: Right Arm, Patient Position: Sitting, Cuff Size: Large)   Pulse 83   Resp 18   Ht 5' 9.5" (1.765 m)   Wt 251 lb 12.8 oz (114.2 kg)   SpO2 96% Comment: RA  BMI 36.65 kg/m   General:  Well-appearing  Chest:   Clear to auscultation  CV:   Regular rate and rhythm without murmur  Incisions:  Well-healed, sternum is stable  Abdomen:  Soft nontender  Extremities:  Warm and well-perfused  Diagnostic Tests:  2 channel telemetry rhythm strip demonstrates what appears to be atrial fibrillation  with controlled ventricular rate   Transthoracic Echocardiography  Patient:    Dequavius, Kuhner MR #:       [id number] Study Date: 05/24/2018 Gender:     M Age:        49 Height:     177.8 cm Weight:     129.5 kg BSA:        2.59 m^2 Pt. Status: Room:       4E09C   ADMITTING    Darylene Price, M.D.  ATTENDING    Darylene Price, M.D.  ORDERING     Darylene Price, M.D.  REFERRING    Darylene Price, M.D.  SONOGRAPHER  Dustin Flock, RCS  PERFORMING   [facility], Inpatient  cc:  ------------------------------------------------------------------- LV EF: 55%  ------------------------------------------------------------------- Indications:      Aortic stenosis /insufficiency 424.1.  Mitral stenosis [non-rheumatic]  424.0.  ------------------------------------------------------------------- History:   PMH:  MAZE procedure.  Mitral valve disease.  Aortic valve disease.  ------------------------------------------------------------------- Study Conclusions  - Left ventricle: The cavity size was normal. Wall thickness was   increased in a pattern of mild LVH. The estimated ejection   fraction was 55%. Hypokinesis of the basal septum. Indeterminant   diastolic function. - Aortic valve: Status post aortic valve repair. There was mild   stenosis. There was no regurgitation. Mean gradient (S): 15 mm   Hg. Valve area (VTI): 1.63 cm^2. - Mitral valve: Status post mitral valve repair. No significant   regurgitation or stenosis. Mean gradient (D): 4 mm Hg. Valve area   by pressure half-time: 2.24 cm^2. - Right ventricle: The cavity size was normal. Systolic function   was mildly reduced. - Right atrium: The atrium was mildly dilated. - Pulmonary arteries: No complete TR doppler jet so unable to   estimate PA systolic pressure. - Systemic veins: IVC was not visualized. - Pericardium, extracardiac: A small pericardial effusion was   identified.  Impressions:  - Normal LV size with mild LV hypertrophy. EF 55%, hypokinesis of   the basal septum. Normal RV size with mildly decreased systolic   function. S/p mitral valve repair with no significant stenosis or   regurgitation. S/p aortic valve repair with no regurgitation but   mild stenosis.  ------------------------------------------------------------------- Study data:  Comparison was made to the study of 05/18/2018.  Study status:  Routine.  Procedure:  The patient reported no pain pre or post test. Transthoracic echocardiography. Image quality was adequate.  Study completion:  There were no complications. Transthoracic echocardiography.  M-mode, complete 2D, spectral Doppler, and color Doppler.  Birthdate:  Patient birthdate: January 07, 1951.  Age:   Patient is 68 yr old.  Sex:  Gender: male. BMI: 41 kg/m^2.  Blood pressure:     142/88  Patient status: Inpatient.  Study date:  Study date: 05/24/2018. Study time: 11:00 AM.  Location:  [facility].  -------------------------------------------------------------------  ------------------------------------------------------------------- Left ventricle:  The cavity size was normal. Wall thickness was increased in a pattern of mild LVH. The estimated ejection fraction was 55%. Hypokinesis of the basal septum. Indeterminant diastolic function.  ------------------------------------------------------------------- Aortic valve:  Status post aortic valve repair.  Doppler:   There was mild stenosis.   There was no regurgitation.    VTI ratio of LVOT to aortic valve: 0.52. Valve area (VTI): 1.63 cm^2. Indexed valve area (VTI): 0.63 cm^2/m^2. Peak velocity ratio of LVOT to aortic valve: 0.46. Valve area (Vmax): 1.46 cm^2. Indexed valve area (Vmax): 0.56 cm^2/m^2. Mean velocity ratio of LVOT to aortic valve: 0.51. Valve area (  Vmean): 1.6 cm^2. Indexed valve area (Vmean): 0.62 cm^2/m^2.    Mean gradient (S): 15 mm Hg. Peak gradient (S): 30 mm Hg.  ------------------------------------------------------------------- Aorta:  Aortic root: The aortic root was normal in size. Ascending aorta: The ascending aorta was normal in size.  ------------------------------------------------------------------- Mitral valve:  Status post mitral valve repair. No significant regurgitation or stenosis.  Doppler:     Valve area by pressure half-time: 2.24 cm^2. Indexed valve area by pressure half-time: 0.87 cm^2/m^2. Indexed valve area by continuity equation (using LVOT flow): 0.53 cm^2/m^2.    Mean gradient (D): 4 mm Hg. Peak gradient (D): 8 mm Hg.  ------------------------------------------------------------------- Left atrium:  The atrium was normal in  size.  ------------------------------------------------------------------- Right ventricle:  The cavity size was normal. Systolic function was mildly reduced.  ------------------------------------------------------------------- Pulmonic valve:    Structurally normal valve.   Cusp separation was normal.  Doppler:  Transvalvular velocity was within the normal range. There was no regurgitation.  ------------------------------------------------------------------- Tricuspid valve:   Doppler:  There was no significant regurgitation.  ------------------------------------------------------------------- Pulmonary artery:   No complete TR doppler jet so unable to estimate PA systolic pressure.  ------------------------------------------------------------------- Right atrium:  The atrium was mildly dilated.  ------------------------------------------------------------------- Pericardium:  A small pericardial effusion was identified.  ------------------------------------------------------------------- Systemic veins:  IVC was not visualized.  ------------------------------------------------------------------- Measurements   Left ventricle                           Value          Reference  LV ID, ED, PLAX chordal                  51    mm       43 - 52  LV ID, ES, PLAX chordal                  34    mm       23 - 38  LV fx shortening, PLAX chordal           33    %        >=29  LV PW thickness, ED                      15    mm       ----------  IVS/LV PW ratio, ED                      1.07           <=1.3  Stroke volume, 2D                        70    ml       ----------  Stroke volume/bsa, 2D                    27    ml/m^2   ----------  LV ejection fraction, 1-p A4C            55    %        ----------  LV e&', lateral                           6.74  cm/s     ----------  LV E/e&', lateral  21.51          ----------  LV e&', medial                             5.87  cm/s     ----------  LV E/e&', medial                          24.7           ----------  LV e&', average                           6.31  cm/s     ----------  LV E/e&', average                         23             ----------    Ventricular septum                       Value          Reference  IVS thickness, ED                        16    mm       ----------    LVOT                                     Value          Reference  LVOT ID, S                               20    mm       ----------  LVOT area                                3.14  cm^2     ----------  LVOT peak velocity, S                    127   cm/s     ----------  LVOT mean velocity, S                    91.4  cm/s     ----------  LVOT VTI, S                              22.2  cm       ----------  LVOT peak gradient, S                    6     mm Hg    ----------    Aortic valve                             Value          Reference  Aortic valve peak velocity, S            274   cm/s     ----------  Aortic valve mean velocity, S            179   cm/s     ----------  Aortic valve VTI, S                      42.8  cm       ----------  Aortic mean gradient, S                  15    mm Hg    ----------  Aortic peak gradient, S                  30    mm Hg    ----------  VTI ratio, LVOT/AV                       0.52           ----------  Aortic valve area, VTI                   1.63  cm^2     ----------  Aortic valve area/bsa, VTI               0.63  cm^2/m^2 ----------  Velocity ratio, peak, LVOT/AV            0.46           ----------  Aortic valve area, peak velocity         1.46  cm^2     ----------  Aortic valve area/bsa, peak              0.56  cm^2/m^2 ----------  velocity  Velocity ratio, mean, LVOT/AV            0.51           ----------  Aortic valve area, mean velocity         1.6   cm^2     ----------  Aortic valve area/bsa, mean              0.62  cm^2/m^2 ----------  velocity    Aorta                                     Value          Reference  Aortic root ID, ED                       33    mm       ----------    Left atrium                              Value          Reference  LA ID, A-P, ES                           53    mm       ----------  LA ID/bsa, A-P                           2.05  cm/m^2   <=2.2  LA volume, S  63.9  ml       ----------  LA volume/bsa, S                         24.7  ml/m^2   ----------  LA volume, ES, 1-p A4C                   68.2  ml       ----------  LA volume/bsa, ES, 1-p A4C               26.4  ml/m^2   ----------  LA volume, ES, 1-p A2C                   49.1  ml       ----------  LA volume/bsa, ES, 1-p A2C               19    ml/m^2   ----------    Mitral valve                             Value          Reference  Mitral E-wave peak velocity              145   cm/s     ----------  Mitral A-wave peak velocity              28.3  cm/s     ----------  Mitral mean velocity, D                  84.8  cm/s     ----------  Mitral deceleration time         (H)     306   ms       150 - 230  Mitral pressure half-time                106   ms       ----------  Mitral mean gradient, D                  4     mm Hg    ----------  Mitral peak gradient, D                  8     mm Hg    ----------  Mitral E/A ratio, peak                   5.1            ----------  Mitral valve area, PHT, DP               2.24  cm^2     ----------  Mitral valve area/bsa, PHT, DP           0.87  cm^2/m^2 ----------  Mitral valve area/bsa, LVOT              0.53  cm^2/m^2 ----------  continuity  Mitral annulus VTI, D                    50.7  cm       ----------    Right atrium                             Value  Reference  RA ID, S-I, ES, A4C              (H)     62.9  mm       34 - 49  RA area, ES, A4C                 (H)     26.6  cm^2     8.3 - 19.5  RA volume, ES, A/L                       91.8  ml       ----------  RA volume/bsa, ES, A/L                    35.5  ml/m^2   ----------    Right ventricle                          Value          Reference  RV ID, minor axis, ED, A4C base          30    mm       ----------  TAPSE                                    27.9  mm       ----------  RV s&', lateral, S                        9.46  cm/s     ----------  Legend: (L)  and  (H)  mark values outside specified reference range.  ------------------------------------------------------------------- Prepared and Electronically Authenticated by  [name], M.D. 2019-11-11T15:47:28   Impression:  Patient is doing very well more than 2 months status post aortic valve repair, mitral valve repair, and Maze procedure.  Clinically the patient looks quite good and feels well.  He no longer has any palpitations.  He appears to be in rate controlled atrial fibrillation.   Plan:  I have instructed the patient to decrease his dose of amiodarone to 200 mg daily.  We have not made any other changes to his current medications.  I have encouraged the patient to continue to gradually increase his physical activity without any particular limitations.  We have discussed recommendations regarding physical activity and plans for exercise as he continues to work on weight loss and improving his exercise tolerance.  If he remains in atrial fibrillation it would be reasonable to consider an attempt at DC cardioversion while he remains on amiodarone.  If desired the patient could stop Coumadin and resume Xarelto.  I have instructed the patient to make sure that he has a routine follow-up appointment scheduled in the atrial fibrillation clinic within the next 6 weeks.  He will return to our office for routine follow-up in approximately 4 months.     Valentina Gu. Roxy Manns, MD 08/02/2018 4:17 PM

## 2018-08-03 ENCOUNTER — Telehealth (HOSPITAL_COMMUNITY): Payer: Self-pay | Admitting: Internal Medicine

## 2018-08-05 ENCOUNTER — Inpatient Hospital Stay (HOSPITAL_COMMUNITY): Admission: RE | Admit: 2018-08-05 | Payer: Medicare HMO | Source: Ambulatory Visit

## 2018-08-08 ENCOUNTER — Other Ambulatory Visit: Payer: Self-pay | Admitting: Cardiovascular Disease

## 2018-08-09 ENCOUNTER — Ambulatory Visit (HOSPITAL_COMMUNITY): Payer: Medicare HMO

## 2018-08-10 ENCOUNTER — Ambulatory Visit: Payer: Medicare HMO | Admitting: Pharmacist

## 2018-08-10 ENCOUNTER — Other Ambulatory Visit: Payer: Self-pay | Admitting: Surgical

## 2018-08-10 DIAGNOSIS — Z7901 Long term (current) use of anticoagulants: Secondary | ICD-10-CM | POA: Diagnosis not present

## 2018-08-10 DIAGNOSIS — Z9889 Other specified postprocedural states: Secondary | ICD-10-CM

## 2018-08-10 LAB — POCT INR: INR: 2 (ref 2.0–3.0)

## 2018-08-10 MED ORDER — WARFARIN SODIUM 5 MG PO TABS: ORAL_TABLET | ORAL | 1 refills | Status: DC

## 2018-08-11 ENCOUNTER — Ambulatory Visit (HOSPITAL_COMMUNITY): Payer: Medicare HMO

## 2018-08-13 ENCOUNTER — Ambulatory Visit (HOSPITAL_COMMUNITY): Payer: Medicare HMO

## 2018-08-16 ENCOUNTER — Ambulatory Visit (HOSPITAL_COMMUNITY): Payer: Medicare HMO

## 2018-08-16 ENCOUNTER — Telehealth: Payer: Self-pay | Admitting: Cardiovascular Disease

## 2018-08-16 NOTE — Telephone Encounter (Signed)
° ° °  STAT if HR is under 50 or over 120 (normal HR is 60-100 beats per minute)  1) What is your heart rate? Current rate 52  2) Do you have a log of your heart rate readings (document readings)? Patient reports his FitBit is recording a HR of 38-40 while sleeping. Once he gets up often feels dizzy.   3) Do you have any other symptoms? Patient states during church on Sunday had blurred vision, HR was 44

## 2018-08-16 NOTE — Telephone Encounter (Signed)
Called patient, he states that his HR has continued to drop. He states that when he is up and moving his HR is fine, but when he is at rest it drops to the 40's. If he stands he will get dizzy, and yesterday experienced double vision. Patient's BP has been running well 116/63 HR 55, 128/63 HR 51, 128/69 HR 49. Patient states that the only change recently on 08/02/2018 was a decrease in his Amiodarone XL to one time daily instead of two as his HR at that appointment was documented at 14. Patient denies CP, Swelling, and SOB. Patient encourage to drink plenty of fluids, and made first available appointment with NP tomorrow morning. Patient verbalized understanding.

## 2018-08-17 ENCOUNTER — Ambulatory Visit (INDEPENDENT_AMBULATORY_CARE_PROVIDER_SITE_OTHER): Payer: Medicare HMO | Admitting: Adult Health

## 2018-08-17 ENCOUNTER — Encounter: Payer: Self-pay | Admitting: Adult Health

## 2018-08-17 VITALS — BP 142/62 | HR 61 | Ht 70.0 in | Wt 256.6 lb

## 2018-08-17 DIAGNOSIS — I4819 Other persistent atrial fibrillation: Secondary | ICD-10-CM | POA: Diagnosis not present

## 2018-08-17 DIAGNOSIS — I10 Essential (primary) hypertension: Secondary | ICD-10-CM | POA: Diagnosis not present

## 2018-08-17 DIAGNOSIS — Z79899 Other long term (current) drug therapy: Secondary | ICD-10-CM

## 2018-08-17 DIAGNOSIS — Z8679 Personal history of other diseases of the circulatory system: Secondary | ICD-10-CM

## 2018-08-17 DIAGNOSIS — Z9889 Other specified postprocedural states: Secondary | ICD-10-CM | POA: Diagnosis not present

## 2018-08-17 NOTE — Progress Notes (Signed)
Cardiology Office Note   Date:  08/17/2018   ID:  [name], DOB 1950/12/31, MRN [id number]  PCP:  Lance Carol, MD  Cardiologist: Dr. Gwenlyn Found  Chief Complaint  Patient presents with  . Atrial Fibrillation  . Aortic Stenosis  . Mitral Stenosis     History of Present Illness: [name] is a 68 y.o. male who presents for ongoing assessment and management of atrial fibrillation, s/p DCCV but reverted by to atrial fib,  HTN, OSA. A stress test demonstrated inferior scar without ischemia. He has also been followed by Dr. Rayann Andrews and is on Tikosyn.  He had right heart cath and TEE 10/9/2019This showed normal coronary arteries, elevated filling pressures and wedge pressure. The TEE did show moderate to severe MR and AI with an EF of 35 to 40%.  He underwent aortic and mitral valve repair as well as left atrial surgical maze procedure by Dr. Roxy Andrews 05/18/2018 with an excellent result.  He is still in and out of A. fib on amiodarone and Coumadin anticoagulation.  His postop echo performed 05/24/2018 revealed normal LV size and function with mild LVH and a well-functioning aortic and mitral valve repair.   He called our office on 08/17/2018 reporting that his HR was dropping. He reported that when he is at rest his HR drops into the 40's and when he stands, he gets dizzy and has double vision. The only change in his medication was a decreased dose of amiodarone to once daily.    He comes today with symptoms of dizziness and blurred vision when his HR drops. He has noticed that his BP is lower at well. He has had to hold telmisartan on a couple of occasions due to dizziness and low BP at home. This has become concerning for him. He is now on amiodarone 200 mg daily.   Past Medical History:  Diagnosis Date  . Aortic insufficiency 04/21/2018  . Aortic insufficiency   . Arthritis    "joints" (09/26/2013)  . Atrial enlargement, left   . Dyspnea    with exertion  . Hearing aid worn    both  ears  . Hypertension   . Mitral regurgitation 04/21/2018  . Obesity   . OSA on CPAP    "mask adjusts to what I need" (09/26/2013)  . Persistent atrial fibrillation    cardioversion 06/06/13  . PONV (postoperative nausea and vomiting)   . Rotator cuff tear, right dec 2012   physical therapy done, decreased strength  . S/P aortic valve repair 05/18/2018   Complex valvuloplasty including plication of right coronary leaflet and 21 mm Biostable HAART annuloplasty ring  . S/P Maze operation for atrial fibrillation 05/18/2018   Complete bilateral atrial lesion set using bipolar radiofrequency and cryothermy ablation with clipping of LA appendage  . S/P mitral valve repair 05/18/2018   Complex valvuloplasty including artificial Gore-tex neochord placement x4 and 30 mm Sorin Memo 4D ring annuloplasty  . Wears glasses     Past Surgical History:  Procedure Laterality Date  . AORTIC VALVE REPAIR N/A 05/18/2018   Procedure: AORTIC VALVE REPAIR using HAART 300 Aortic Annuloplasty Device size 51mm;  Surgeon: Lance Alberts, MD;  Location: [place];  Service: Open Heart Surgery;  Laterality: N/A;  . CARDIOVERSION N/A 06/06/2013   Procedure: CARDIOVERSION;  Surgeon: Sanda Klein, MD;  Location: [place] [facility];  Service: Cardiovascular;  Laterality: N/A;  . CARDIOVERSION N/A 09/28/2013   Procedure: CARDIOVERSION BEDSIDE;  Surgeon: Peter M Martinique, MD;  Location: [place] [facility];  Service: Cardiovascular;  Laterality: N/A;  . CARPAL TUNNEL RELEASE Right 08/08/2013   Procedure: RIGHT CARPAL TUNNEL RELEASE;  Surgeon: Wynonia Sours, MD;  Location: [place];  Service: Orthopedics;  Laterality: Right;  . CARPAL TUNNEL RELEASE Left 2008  . CLIPPING OF ATRIAL APPENDAGE  05/18/2018   Procedure: CLIPPING OF ATRIAL APPENDAGE using AtriCure Clip size 45;  Surgeon: Lance Alberts, MD;  Location: [place];  Service: Open Heart Surgery;;  . KNEE ARTHROSCOPY Right 1990's  . MAZE N/A 05/18/2018   Procedure: MAZE;   Surgeon: Lance Alberts, MD;  Location: [place];  Service: Open Heart Surgery;  Laterality: N/A;  . MITRAL VALVE REPAIR N/A 05/18/2018   Procedure: MITRAL VALVE REPAIR (MVR) using 4D Memo Ring size 30;  Surgeon: Lance Alberts, MD;  Location: [place];  Service: Open Heart Surgery;  Laterality: N/A;  . RIGHT/LEFT HEART CATH AND CORONARY ANGIOGRAPHY N/A 04/21/2018   Procedure: RIGHT/LEFT HEART CATH AND CORONARY ANGIOGRAPHY;  Surgeon: Nelva Bush, MD;  Location: [place] [facility];  Service: Cardiovascular;  Laterality: N/A;  . SHOULDER OPEN ROTATOR CUFF REPAIR Left 1990's  . SHOULDER OPEN ROTATOR CUFF REPAIR Right 11/2007  . TEE WITHOUT CARDIOVERSION N/A 04/21/2018   Procedure: TRANSESOPHAGEAL ECHOCARDIOGRAM (TEE);  Surgeon: Jerline Pain, MD;  Location: [facility];  Service: Cardiovascular;  Laterality: N/A;  . TEE WITHOUT CARDIOVERSION N/A 05/18/2018   Procedure: TRANSESOPHAGEAL ECHOCARDIOGRAM (TEE);  Surgeon: Lance Alberts, MD;  Location: [place];  Service: Open Heart Surgery;  Laterality: N/A;  . TONSILLECTOMY  1950's  . TOTAL HIP ARTHROPLASTY Left 2010  . TOTAL HIP REVISION Left 10/08/2011  . TOTAL HIP REVISION  04/09/2012   Procedure: TOTAL HIP REVISION;  Surgeon: Lance Alf, MD;  [facility];  Service: Orthopedics;  Laterality: Left;  Left Hip Acetabular Revision vs Constrained Liner  . TOTAL KNEE ARTHROPLASTY Right 2006   . TRIGGER FINGER RELEASE Right 08/08/2013   Procedure: RELEASE STENOSING TENOSYNOVITIS RIGHT THUMB;  Surgeon: Wynonia Sours, MD;  Location: [place];  Service: Orthopedics;  Laterality: Right;     Current Outpatient Medications  Medication Sig Dispense Refill  . aspirin EC 81 MG EC tablet Take 1 tablet (81 mg total) by mouth daily.    . furosemide (LASIX) 40 MG tablet Take 2 tablets (80 mg total) by mouth 2 (two) times daily. For 3 days, then change to 40 mg BID (Patient taking differently: Take 80 mg by mouth 2 (two) times daily. ) 120  tablet 0  . KLOR-CON M20 20 MEQ tablet TAKE 1 TABLET BY MOUTH TWICE A DAY FOR 3 DAYS TAKE 1 TABLET ONCE A DAY 60 tablet 0  . potassium chloride 20 MEQ TBCR Take 20 mEq by mouth 2 (two) times daily. For 3 days, then 20 meq once daily 60 tablet 0  . Probiotic Product (PROBIOTIC PO) Take 1 capsule by mouth 3 (three) times a week.     . telmisartan (MICARDIS) 80 MG tablet Take 80 mg by mouth daily.    Marland Kitchen warfarin (COUMADIN) 5 MG tablet Take 1/2 to 1 tablets daily as directed by coumadin clinic 30 tablet 1   No current facility-administered medications for this visit.     Allergies:   Oxycodone    Social History:  The patient  reports that he has never smoked. He has never used smokeless tobacco. He reports that he does not drink alcohol or use drugs.  Family History:  The patient's family history includes Other in an other family member.    ROS: All other systems are reviewed and negative. Unless otherwise mentioned in H&P    PHYSICAL EXAM: VS:  BP (!) 142/62   Pulse 61   Ht 5\' 10"  (1.778 m)   Wt 256 lb 9.6 oz (116.4 kg)   BMI 36.82 kg/m  , BMI Body mass index is 36.82 kg/m. GEN: Well nourished, well developed, in no acute distress HEENT: normal Neck: no JVD, carotid bruits, or masses Cardiac: IRRR; 1/6 systolic  murmurs, rubs, or gallops,no edema  Respiratory:  Clear to auscultation bilaterally, normal work of breathing GI: soft, nontender, nondistended, + BS MS: no deformity or atrophy Skin: warm and dry, no rash Neuro:  Strength and sensation are intact Psych: euthymic mood, full affect   EKG:  Atrial fib with junctional rhythm. Rate of 61 bpm. Recent Labs: 05/14/2018: ALT 48 05/20/2018: Magnesium 2.2 06/17/2018: BNP 233.1; BUN 20; Creatinine, Ser 1.04; Hemoglobin 12.0; Platelets 369; Potassium 3.7; Sodium 139    Lipid Panel No results found for: CHOL, TRIG, HDL, CHOLHDL, VLDL, LDLCALC, LDLDIRECT    Wt Readings from Last 3 Encounters:  08/17/18 256 lb 9.6 oz (116.4  kg)  08/02/18 251 lb 12.8 oz (114.2 kg)  06/30/18 252 lb (114.3 kg)      Other studies Reviewed: Stress Test 11/07/2016  There is no evidence of ischemia  The LV is moderately dilated  The study is not gated so the LV function was not assessed.  Suggest echo for assessment of LV function .  Intermittant risk myoview based on LV dilitation and likely reduced EF  Cardiac Cath 04/21/2018  Conclusions: 1. No angiographically significant coronary artery disease. 2. Mildly to moderately elevated left heart filling pressures.  Prominent V-waves and elevated PCWP relative to LVEDP support moderate to severe mitral regurgitation noted on today's TEE. 3. Moderately elevated right heart and pulmonary artery pressures. 4. Moderately reduced Fick cardiac output/index. 5. New LBBB after crossing the aortic valve for left heart catheterization.  Recommendations: 1. Admit for observation to facilitate diuresis and medical optimization of acute on chronic systolic heart failure (LVEF 35-40% by TEE today) with significant valvular heart disease. 2. Monitor on telemetry, given new LBBB during/after catheterization. 3. Start heparin infusion 2 hours after TR band removal.  If no evidence of bleeding or vascular injury, patient can be transitioned back to rivaroxaban tomorrow.  Recommend to resume Rivaroxaban, at currently prescribed dose and frequency, on 04/22/18 if no evidence of bleeding or vascular injury.  Concurrent antiplatelet therapy not recommended.   Echocardiogram 05/24/2018  ASSESSMENT AND PLAN:  1. Atrial fibrillation: He is having slower HR which are becoming symptomatic causing dizziness and blurred vision. He has sometimes had to hold BP medication in the am due to low BP. HR has dropped in to the 40's especially in the am. He feels better once he is up and active, but when he sits he becomes dizzy, or when he stands up.   I will stop the amiodarone for now. This will need to be  washed out over a few weeks. He is to see Dr.Allred in 2 weeks to discuss ablation or other medication adjustments. He should still have a level of amiodarone to allow for HR control but not significant bradycardia. He may go back on metoprolol. Will leave this to Dr. Jackalyn Lombard expertise. Will check TSH and BMET. He will continue on coumadin but is interested in returning to  NOAC at some point.   2.S/P AoV and MV repair with MAZE procedure: Completed on 05/18/2018. He has been doing very well post-operatively and has seen Dr. Ricard Dillon on follow up who also recommended weaning of the amiodarone. He has remained active, goes to the gym and rides the bike or walks on the treadmill. He is losing weight and feeling stronger with the exception of symptoms listed above.   3. Hypertension: BP has been controlled. Slightly elevated here today. He has taken his medications this am and continues frequent urination on lasix. Checking BMET.    Current medicines are reviewed at length with the patient today.    Labs/ tests ordered today include: TSH. BMET Phill Myron. [place] Pugh, ANP, [place]   08/17/2018 9:08 AM    [address] [place] [address] Office [phone] Fax [phone]

## 2018-08-17 NOTE — Patient Instructions (Signed)
Follow-Up: You will need a follow up appointment in [place]. You may see Quay Burow, MD(DISCUSS FOLLOW UP AT [facility] APPT) or one of the following Advanced Practice Providers on your designated Care Team:  Kerin Ransom, [place] Roby Lofts, PA-C Sande Rives, [place]  Medication Instructions:  STOP AMIODARONE  Your physician recommends that you continue on your current medications as directed. Please refer to the Current Medication list given to you today. If you need a refill on your cardiac medications before your next appointment, please call your pharmacy.  Labwork: TSH AND BMET TODAY HERE IN OUR OFFICE AT [facility]  Take the provided lab slips with you to the lab for your blood draw.   When you have your labs (blood work) drawn today and your tests are completely normal, you will receive your results only by MyChart Message (if you have MyChart) -OR-  A paper copy in the mail.  If you have any lab test that is abnormal or we need to change your treatment, we will call you to review these results.  At [facility], you and your health needs are our priority.  As part of our continuing mission to provide you with exceptional heart care, we have created designated Provider Care Teams.  These Care Teams include your primary Cardiologist (physician) and Advanced Practice Providers (APPs -  Physician Assistants and Nurse Practitioners) who all work together to provide you with the care you need, when you need it.  Thank you for choosing [facility] at [facility]!!

## 2018-08-18 ENCOUNTER — Ambulatory Visit (HOSPITAL_COMMUNITY): Payer: Medicare HMO

## 2018-08-18 LAB — BASIC METABOLIC PANEL
BUN/Creatinine Ratio: 24 (ref 10–24)
BUN: 24 mg/dL (ref 8–27)
CHLORIDE: 99 mmol/L (ref 96–106)
CO2: 25 mmol/L (ref 20–29)
Calcium: 9.7 mg/dL (ref 8.6–10.2)
Creatinine, Ser: 1.02 mg/dL (ref 0.76–1.27)
GFR calc Af Amer: 89 mL/min/{1.73_m2} (ref >59)
GFR calc non Af Amer: 77 mL/min/{1.73_m2} (ref >59)
Glucose: 105 mg/dL — ABNORMAL HIGH (ref 65–99)
Potassium: 3.5 mmol/L (ref 3.5–5.2)
Sodium: 141 mmol/L (ref 134–144)

## 2018-08-18 LAB — TSH: TSH: 2.71 u[IU]/mL (ref 0.450–4.500)

## 2018-08-20 ENCOUNTER — Ambulatory Visit (HOSPITAL_COMMUNITY): Payer: Medicare HMO

## 2018-08-22 ENCOUNTER — Other Ambulatory Visit: Payer: Self-pay

## 2018-08-23 ENCOUNTER — Ambulatory Visit (HOSPITAL_COMMUNITY): Payer: Medicare HMO

## 2018-08-24 ENCOUNTER — Other Ambulatory Visit: Payer: Self-pay | Admitting: *Deleted

## 2018-08-24 DIAGNOSIS — I5032 Chronic diastolic (congestive) heart failure: Secondary | ICD-10-CM

## 2018-08-24 MED ORDER — FUROSEMIDE 40 MG PO TABS: 80.0000 mg | ORAL_TABLET | Freq: Two times a day (BID) | ORAL | 0 refills | Status: DC

## 2018-08-25 ENCOUNTER — Ambulatory Visit (HOSPITAL_COMMUNITY): Payer: Medicare HMO

## 2018-08-25 ENCOUNTER — Telehealth: Payer: Self-pay | Admitting: Adult Health

## 2018-08-25 NOTE — Telephone Encounter (Signed)
° ° °  New message   Patient calling, wants to discuss setting up cardiac rehab. Patient states he was told by rehab based on the last office note from [place] he could not participate. Please call

## 2018-08-25 NOTE — Telephone Encounter (Signed)
Called pt and left a message to let him know he would have to complete his follow up on 09/02/2018 with Dr. Rayann Heman before scheduling

## 2018-08-25 NOTE — Telephone Encounter (Signed)
He has been taken off of amiodarone due to low HR and BP with dizziness. He is to see Dr. Rayann Heman first to discuss DCCV. Once he has completed his work up with Dr. Rayann Heman, he can be released to continue cardiac rehab.

## 2018-08-26 NOTE — Telephone Encounter (Signed)
I am sorry for his confusion and frustration. Dr. Rayann Heman is to see him and release him to cardiac rehab.

## 2018-08-26 NOTE — Telephone Encounter (Signed)
PT NOTIFIED-HE IS UPSET AND STATES THAT DR Roxy Manns REFERRED HIM TO CARDIAC REHAB AND HE DOES NOT SEE WHY HE CAN T GO BUT STATES THAT HE WILL WAIT UNTIL APPT 09-02-2018 W/ALLRED FOR HIM TO RELEASE HIM. HE STATES THAT THIS IS VERY CONFUSING AND DOES NOT UNDERSTAND.

## 2018-08-26 NOTE — Telephone Encounter (Signed)
NOTED

## 2018-08-27 ENCOUNTER — Ambulatory Visit (HOSPITAL_COMMUNITY): Payer: Medicare HMO

## 2018-08-30 ENCOUNTER — Ambulatory Visit (HOSPITAL_COMMUNITY): Payer: Medicare HMO

## 2018-09-01 ENCOUNTER — Ambulatory Visit (HOSPITAL_COMMUNITY): Payer: Medicare HMO

## 2018-09-02 ENCOUNTER — Encounter: Payer: Self-pay | Admitting: Internal Medicine

## 2018-09-02 ENCOUNTER — Ambulatory Visit (INDEPENDENT_AMBULATORY_CARE_PROVIDER_SITE_OTHER): Payer: Medicare HMO | Admitting: Pharmacist

## 2018-09-02 ENCOUNTER — Ambulatory Visit: Payer: Medicare HMO | Admitting: Internal Medicine

## 2018-09-02 VITALS — BP 138/82 | HR 50 | Ht 70.0 in | Wt 258.8 lb

## 2018-09-02 DIAGNOSIS — Z9889 Other specified postprocedural states: Secondary | ICD-10-CM

## 2018-09-02 DIAGNOSIS — Z7901 Long term (current) use of anticoagulants: Secondary | ICD-10-CM | POA: Diagnosis not present

## 2018-09-02 DIAGNOSIS — I10 Essential (primary) hypertension: Secondary | ICD-10-CM | POA: Diagnosis not present

## 2018-09-02 DIAGNOSIS — I4819 Other persistent atrial fibrillation: Secondary | ICD-10-CM

## 2018-09-02 DIAGNOSIS — G4733 Obstructive sleep apnea (adult) (pediatric): Secondary | ICD-10-CM

## 2018-09-02 LAB — POCT INR: INR: 1.7 — AB (ref 2.0–3.0)

## 2018-09-02 MED ORDER — WARFARIN SODIUM 5 MG PO TABS: ORAL_TABLET | ORAL | 1 refills | Status: DC

## 2018-09-02 NOTE — Patient Instructions (Addendum)
Medication Instructions:  Your physician recommends that you continue on your current medications as directed. Please refer to the Current Medication list given to you today.  Labwork: None ordered.  Testing/Procedures: Your physician has recommended that you have a Cardioversion (DCCV). Electrical Cardioversion uses a jolt of electricity to your heart either through paddles or wired patches attached to your chest. This is a controlled, usually prescheduled, procedure. Defibrillation is done under light anesthesia in the hospital, and you usually go home the day of the procedure. This is done to get your heart back into a normal rhythm. You are not awake for the procedure. Please see the instruction sheet given to you today.  Follow-Up: Your physician wants you to follow-up in: 4 weeks with Dr. Rayann Heman after your cardioversion.  I will contact you to schedule your cardioversion after you coumadin clinic and after further discussion with Dr. Rayann Heman.

## 2018-09-02 NOTE — Progress Notes (Signed)
PCP: Seward Carol, MD Primary Cardiologist: Dr Gwenlyn Found Primary EP: Dr Rayann Heman  [name] is a 68 y.o. male who presents today for routine electrophysiology followup.  Since last being seen in our clinic, the patient has undergone aortic and mitral valve repair with MAZE and LAA amputation.  His recovery has been slow.  He is in afib.  He has not had cardioversion.  He has had cardioversion and SOB.  Today, he denies symptoms of palpitations, chest pain,  lower extremity edema, dizziness, presyncope, or syncope.  The patient is otherwise without complaint today.   Past Medical History:  Diagnosis Date  . Aortic insufficiency 04/21/2018  . Aortic insufficiency   . Arthritis    "joints" (09/26/2013)  . Atrial enlargement, left   . Dyspnea    with exertion  . Hearing aid worn    both ears  . Hypertension   . Mitral regurgitation 04/21/2018  . Obesity   . OSA on CPAP    "mask adjusts to what I need" (09/26/2013)  . Persistent atrial fibrillation    cardioversion 06/06/13  . PONV (postoperative nausea and vomiting)   . Rotator cuff tear, right dec 2012   physical therapy done, decreased strength  . S/P aortic valve repair 05/18/2018   Complex valvuloplasty including plication of right coronary leaflet and 21 mm Biostable HAART annuloplasty ring  . S/P Maze operation for atrial fibrillation 05/18/2018   Complete bilateral atrial lesion set using bipolar radiofrequency and cryothermy ablation with clipping of LA appendage  . S/P mitral valve repair 05/18/2018   Complex valvuloplasty including artificial Gore-tex neochord placement x4 and 30 mm Sorin Memo 4D ring annuloplasty  . Wears glasses    Past Surgical History:  Procedure Laterality Date  . AORTIC VALVE REPAIR N/A 05/18/2018   Procedure: AORTIC VALVE REPAIR using HAART 300 Aortic Annuloplasty Device size 19mm;  Surgeon: Rexene Alberts, MD;  Location: [place];  Service: Open Heart Surgery;  Laterality: N/A;  . CARDIOVERSION N/A  06/06/2013   Procedure: CARDIOVERSION;  Surgeon: Sanda Klein, MD;  Location: [place] [facility];  Service: Cardiovascular;  Laterality: N/A;  . CARDIOVERSION N/A 09/28/2013   Procedure: CARDIOVERSION BEDSIDE;  Surgeon: Peter M Martinique, MD;  Location: [place];  Service: Cardiovascular;  Laterality: N/A;  . CARPAL TUNNEL RELEASE Right 08/08/2013   Procedure: RIGHT CARPAL TUNNEL RELEASE;  Surgeon: Wynonia Sours, MD;  Location: [place];  Service: Orthopedics;  Laterality: Right;  . CARPAL TUNNEL RELEASE Left 2008  . CLIPPING OF ATRIAL APPENDAGE  05/18/2018   Procedure: CLIPPING OF ATRIAL APPENDAGE using AtriCure Clip size 45;  Surgeon: Rexene Alberts, MD;  Location: [place];  Service: Open Heart Surgery;;  . KNEE ARTHROSCOPY Right 1990's  . MAZE N/A 05/18/2018   Procedure: MAZE;  Surgeon: Rexene Alberts, MD;  Location: [place];  Service: Open Heart Surgery;  Laterality: N/A;  . MITRAL VALVE REPAIR N/A 05/18/2018   Procedure: MITRAL VALVE REPAIR (MVR) using 4D Memo Ring size 30;  Surgeon: Rexene Alberts, MD;  Location: [place];  Service: Open Heart Surgery;  Laterality: N/A;  . RIGHT/LEFT HEART CATH AND CORONARY ANGIOGRAPHY N/A 04/21/2018   Procedure: RIGHT/LEFT HEART CATH AND CORONARY ANGIOGRAPHY;  Surgeon: Nelva Bush, MD;  Location: [place] CV LAB;  Service: Cardiovascular;  Laterality: N/A;  . SHOULDER OPEN ROTATOR CUFF REPAIR Left 1990's  . SHOULDER OPEN ROTATOR CUFF REPAIR Right 11/2007  . TEE WITHOUT CARDIOVERSION N/A 04/21/2018   Procedure:  TRANSESOPHAGEAL ECHOCARDIOGRAM (TEE);  Surgeon: Jerline Pain, MD;  Location: [facility] ENDOSCOPY;  Service: Cardiovascular;  Laterality: N/A;  . TEE WITHOUT CARDIOVERSION N/A 05/18/2018   Procedure: TRANSESOPHAGEAL ECHOCARDIOGRAM (TEE);  Surgeon: Rexene Alberts, MD;  Location: [place];  Service: Open Heart Surgery;  Laterality: N/A;  . TONSILLECTOMY  1950's  . TOTAL HIP ARTHROPLASTY Left 2010  . TOTAL HIP REVISION Left 10/08/2011  . TOTAL HIP  REVISION  04/09/2012   Procedure: TOTAL HIP REVISION;  Surgeon: Gearlean Alf, MD;  [facility];  Service: Orthopedics;  Laterality: Left;  Left Hip Acetabular Revision vs Constrained Liner  . TOTAL KNEE ARTHROPLASTY Right 2006   . TRIGGER FINGER RELEASE Right 08/08/2013   Procedure: RELEASE STENOSING TENOSYNOVITIS RIGHT THUMB;  Surgeon: Wynonia Sours, MD;  Location: [place];  Service: Orthopedics;  Laterality: Right;    ROS- all systems are reviewed and negatives except as per HPI above  Current Outpatient Medications  Medication Sig Dispense Refill  . aspirin EC 81 MG EC tablet Take 1 tablet (81 mg total) by mouth daily.    . furosemide (LASIX) 40 MG tablet Take 2 tablets (80 mg total) by mouth 2 (two) times daily for 30 days. For 3 days, then change to 40 mg BID 120 tablet 0  . KLOR-CON M20 20 MEQ tablet TAKE 1 TABLET BY MOUTH TWICE A DAY FOR 3 DAYS TAKE 1 TABLET ONCE A DAY 60 tablet 0  . potassium chloride 20 MEQ TBCR Take 20 mEq by mouth 2 (two) times daily. For 3 days, then 20 meq once daily 60 tablet 0  . Probiotic Product (PROBIOTIC PO) Take 1 capsule by mouth 3 (three) times a week.     . telmisartan (MICARDIS) 80 MG tablet Take 80 mg by mouth daily.    Marland Kitchen warfarin (COUMADIN) 5 MG tablet Take 1/2 to 1 tablets daily as directed by coumadin clinic 30 tablet 1   No current facility-administered medications for this visit.     Physical Exam: Vitals:   09/02/18 0821  BP: 138/82  Pulse: (!) 50  SpO2: 98%  Weight: 258 lb 12.8 oz (117.4 kg)  Height: 5\' 10"  (1.778 m)    GEN- The patient is overweight appearing, alert and oriented x 3 today.   Head- normocephalic, atraumatic Eyes-  Sclera clear, conjunctiva pink Ears- hearing intact Oropharynx- clear Lungs- Clear to ausculation bilaterally, normal work of breathing Heart- irregular rate and rhythm, no murmurs, rubs or gallops, PMI not laterally displaced GI- soft, NT, ND, + BS Extremities- no clubbing,  cyanosis, or edema  Wt Readings from Last 3 Encounters:  09/02/18 258 lb 12.8 oz (117.4 kg)  08/17/18 256 lb 9.6 oz (116.4 kg)  08/02/18 251 lb 12.8 oz (114.2 kg)    EKG tracing ordered today is personally reviewed and shows afib, V rate 50 bpm, iLBBB  Assessment and Plan:  1. Longstanding persistent afib with severe LA enlargement S/p recent MAZE and LAA removal He is on coumadin Amiodarone was recently stopped due to bradycardia I would advise cardioversion at this time. Risks and benefits to cardioversion were discussed with the patient who wishes to proceed. All INRs have been therapeutic.  He has also had LAA removal.   2. HTN Stable No change required today  3. Obesity Body mass index is 37.13 kg/m. Lifestyle modification encourage  4. OSA On CPAP  Return to see me 4 weeks post cardioversion for follow-up.  Thompson Grayer MD, [facility] 09/02/2018  8:47 AM

## 2018-09-02 NOTE — Patient Instructions (Signed)
Description   Take 1.5 tablets today, then start taking 1 tablet daily. Recheck INR in 1 week - pending cardioversion.

## 2018-09-03 ENCOUNTER — Telehealth: Payer: Self-pay

## 2018-09-03 ENCOUNTER — Ambulatory Visit (HOSPITAL_COMMUNITY): Payer: Medicare HMO

## 2018-09-06 ENCOUNTER — Ambulatory Visit (HOSPITAL_COMMUNITY): Payer: Medicare HMO

## 2018-09-06 MED ORDER — RIVAROXABAN 20 MG PO TABS: 20.0000 mg | ORAL_TABLET | Freq: Every day | ORAL | 11 refills | Status: DC

## 2018-09-06 NOTE — Telephone Encounter (Signed)
Phone answered by a woman.    This nurse asked for [name].  Woman said she was expecting a call from this office.  Agreed she was [employer].  Would not give Pt's date of birth.  Woman said she was doing a buzz feed quiz and needed to finish and wanted this nurse's help.    Tried to get woman to stop to advise this nurse was in clinic and needed to speak to Pt.  Woman spoke over this nurse and started to give buzz feed quiz.  Phone call ended.

## 2018-09-06 NOTE — Telephone Encounter (Signed)
Call back received from Pt.  Advised Pt to stop warfarin and start Xarelto 20 mg po daily.  Pt will come to office today to pick up samples and medication direction.  Advised Pt to get started on Xarelto with no  Missed doses.  Will call him later this week to set up DCCV.  Pt indicates understanding.

## 2018-09-08 ENCOUNTER — Ambulatory Visit (HOSPITAL_COMMUNITY): Payer: Medicare HMO

## 2018-09-10 ENCOUNTER — Ambulatory Visit (HOSPITAL_COMMUNITY): Payer: Medicare HMO

## 2018-09-13 ENCOUNTER — Ambulatory Visit (HOSPITAL_COMMUNITY): Payer: Medicare HMO

## 2018-09-15 ENCOUNTER — Ambulatory Visit (HOSPITAL_COMMUNITY): Payer: Medicare HMO

## 2018-09-15 ENCOUNTER — Other Ambulatory Visit: Payer: Self-pay | Admitting: Thoracic Surgery (Cardiothoracic Vascular Surgery)

## 2018-09-15 ENCOUNTER — Telehealth: Payer: Self-pay

## 2018-09-15 DIAGNOSIS — I5032 Chronic diastolic (congestive) heart failure: Secondary | ICD-10-CM

## 2018-09-15 NOTE — Telephone Encounter (Signed)
Called to let the pt know that they are overdue for appt.left a msg

## 2018-09-17 ENCOUNTER — Ambulatory Visit (HOSPITAL_COMMUNITY): Payer: Medicare HMO

## 2018-09-20 ENCOUNTER — Ambulatory Visit (HOSPITAL_COMMUNITY): Payer: Medicare HMO

## 2018-09-22 ENCOUNTER — Ambulatory Visit (HOSPITAL_COMMUNITY): Payer: Medicare HMO

## 2018-09-24 ENCOUNTER — Ambulatory Visit (HOSPITAL_COMMUNITY): Payer: Medicare HMO

## 2018-09-27 ENCOUNTER — Ambulatory Visit (HOSPITAL_COMMUNITY): Payer: Medicare HMO

## 2018-09-28 ENCOUNTER — Encounter: Payer: Self-pay | Admitting: Thoracic Surgery (Cardiothoracic Vascular Surgery)

## 2018-09-29 ENCOUNTER — Ambulatory Visit (HOSPITAL_COMMUNITY): Payer: Medicare HMO

## 2018-09-29 ENCOUNTER — Other Ambulatory Visit: Payer: Self-pay

## 2018-09-29 MED ORDER — POTASSIUM CHLORIDE ER 20 MEQ PO TBCR: 20.0000 meq | EXTENDED_RELEASE_TABLET | ORAL | 3 refills | Status: DC

## 2018-09-30 ENCOUNTER — Other Ambulatory Visit: Payer: Self-pay | Admitting: *Deleted

## 2018-09-30 MED ORDER — POTASSIUM CHLORIDE ER 20 MEQ PO TBCR: 20.0000 meq | EXTENDED_RELEASE_TABLET | ORAL | 3 refills | Status: DC

## 2018-10-01 ENCOUNTER — Ambulatory Visit (HOSPITAL_COMMUNITY): Payer: Medicare HMO

## 2018-10-01 ENCOUNTER — Encounter (HOSPITAL_COMMUNITY): Payer: Self-pay

## 2018-10-01 ENCOUNTER — Ambulatory Visit (HOSPITAL_COMMUNITY): Admit: 2018-10-01 | Payer: Medicare HMO | Admitting: Cardiology

## 2018-10-01 SURGERY — CARDIOVERSION
Anesthesia: General

## 2018-10-04 ENCOUNTER — Ambulatory Visit (HOSPITAL_COMMUNITY): Payer: Medicare HMO

## 2018-10-05 ENCOUNTER — Other Ambulatory Visit: Payer: Self-pay | Admitting: Thoracic Surgery (Cardiothoracic Vascular Surgery)

## 2018-10-06 ENCOUNTER — Ambulatory Visit (HOSPITAL_COMMUNITY): Payer: Medicare HMO

## 2018-10-08 ENCOUNTER — Ambulatory Visit (HOSPITAL_COMMUNITY): Payer: Medicare HMO

## 2018-10-09 ENCOUNTER — Other Ambulatory Visit: Payer: Self-pay | Admitting: Cardiovascular Disease

## 2018-10-11 ENCOUNTER — Ambulatory Visit (HOSPITAL_COMMUNITY): Payer: Medicare HMO

## 2018-10-12 ENCOUNTER — Other Ambulatory Visit: Payer: Self-pay

## 2018-10-12 MED ORDER — FUROSEMIDE 40 MG PO TABS: 80.0000 mg | ORAL_TABLET | Freq: Two times a day (BID) | ORAL | 0 refills | Status: DC

## 2018-10-13 ENCOUNTER — Ambulatory Visit (HOSPITAL_COMMUNITY): Payer: Medicare HMO

## 2018-10-15 ENCOUNTER — Ambulatory Visit (HOSPITAL_COMMUNITY): Payer: Medicare HMO

## 2018-10-18 ENCOUNTER — Ambulatory Visit (HOSPITAL_COMMUNITY): Payer: Medicare HMO

## 2018-10-20 ENCOUNTER — Ambulatory Visit (HOSPITAL_COMMUNITY): Payer: Medicare HMO

## 2018-10-21 ENCOUNTER — Telehealth: Payer: Self-pay

## 2018-10-22 ENCOUNTER — Ambulatory Visit (HOSPITAL_COMMUNITY): Payer: Medicare HMO

## 2018-10-22 NOTE — Telephone Encounter (Signed)
LATE ENTRY: 10/21/2018  Spoke with pt who states he generally feels worse than he did before his most recent procedure. He states he is very SOB at times and feels like he's moving backwards. He states he was supposed to start cardiac rehab but they would not take him b/c his HR was in the 30-40s and he would like to be evaluated to see if he can be cleared to start cardiac rehab or at least get guidance from them on reconditioning. He is concerned about low HR which he states has dropped to the 30s at night when he is asleep and was 41 on 4/8. His device monitors his HR and rhythm . He also states that according to his device he is still in A Fib and cardioversion was cancelled d/t COVID-19 crisis. He states last time his HR was in the 40's in the daytime it was during church and he developed blurrry vision and became dizzy. Per pt, he was taken off amiodarone 'b/c it wasn't doing anything' for him. It appears it was causing bradycardia. Pt still taking Xarelto as prescribed. He would like a follow up with Dr. Gwenlyn Found

## 2018-10-23 NOTE — Telephone Encounter (Signed)
It sounds like the major issue has been rhythm.  He has seen Dr. Rayann Heman in February.  He probably needs a follow-up appointment with Dr. Rayann Heman to reevaluate his atrial fibrillation.  He was scheduled for cardioversion which never occurred because the COVID-19 crisis.

## 2018-10-25 ENCOUNTER — Telehealth: Payer: Self-pay | Admitting: Cardiovascular Disease

## 2018-10-25 ENCOUNTER — Ambulatory Visit (HOSPITAL_COMMUNITY): Payer: Medicare HMO

## 2018-10-25 NOTE — Telephone Encounter (Signed)
Pt has an Echo on Monday at 830am, he was told it was cancelled but it is still there. Has a [facility] appt 4-17, can you let him know if he still needs to keep it?

## 2018-10-25 NOTE — Telephone Encounter (Signed)
Pt aware that echo will be rescheduled for a time in the future and is agreeable with this; verbalized understanding

## 2018-10-25 NOTE — Telephone Encounter (Signed)
Routed to Dr. Jackalyn Lombard nurse Willeen Cass, for review

## 2018-10-27 ENCOUNTER — Ambulatory Visit (HOSPITAL_COMMUNITY): Payer: Medicare HMO

## 2018-10-27 ENCOUNTER — Telehealth: Payer: Self-pay | Admitting: *Deleted

## 2018-10-27 NOTE — Telephone Encounter (Signed)
   Cardiac Questionnaire:    Since your last visit or hospitalization:    1. Have you been having new or worsening chest pain? NO   2. Have you been having new or worsening shortness of breath? NO 3. Have you been having new or worsening leg swelling, wt gain, or increase in abdominal girth (pants fitting more tightly)? NO   4. Have you had any passing out spells? NO    *A YES to any of these questions would result in the appointment being kept. *If all the answers to these questions are NO, we should indicate that given the current situation regarding the worldwide coronarvirus pandemic, at the recommendation of the CDC, we are looking to limit gatherings in our waiting area, and thus will reschedule their appointment beyond four weeks from today.   _____________   COVID-19 Pre-Screening Questions:  . Do you currently have a fever? NO . Have you recently travelled on a cruise, internationally, or to [place], [place], [place], [place], [place], or [place], [place] ([employer])? NO . Have you been in contact with someone that is currently pending confirmation of Covid19 testing or has been confirmed to have the Covid19 virus?  NO Are you currently experiencing fatigue or cough?NO          

## 2018-10-28 ENCOUNTER — Telehealth: Payer: Self-pay | Admitting: Internal Medicine

## 2018-10-28 NOTE — Telephone Encounter (Signed)
Echo should be rescheduled to later in summer (June) when distancing restrictions lifted

## 2018-10-29 ENCOUNTER — Telehealth (HOSPITAL_COMMUNITY): Payer: Self-pay | Admitting: *Deleted

## 2018-10-29 ENCOUNTER — Other Ambulatory Visit: Payer: Self-pay

## 2018-10-29 ENCOUNTER — Encounter: Payer: Self-pay | Admitting: Cardiovascular Disease

## 2018-10-29 ENCOUNTER — Ambulatory Visit (INDEPENDENT_AMBULATORY_CARE_PROVIDER_SITE_OTHER): Payer: Medicare HMO | Admitting: Cardiovascular Disease

## 2018-10-29 ENCOUNTER — Ambulatory Visit: Payer: Medicare HMO | Admitting: Internal Medicine

## 2018-10-29 ENCOUNTER — Ambulatory Visit (HOSPITAL_COMMUNITY): Payer: Medicare HMO

## 2018-10-29 VITALS — BP 158/58 | HR 63 | Resp 12 | Ht 70.0 in | Wt 259.8 lb

## 2018-10-29 DIAGNOSIS — I4819 Other persistent atrial fibrillation: Secondary | ICD-10-CM

## 2018-10-29 DIAGNOSIS — I110 Hypertensive heart disease with heart failure: Secondary | ICD-10-CM | POA: Diagnosis not present

## 2018-10-29 DIAGNOSIS — Z13220 Encounter for screening for lipoid disorders: Secondary | ICD-10-CM

## 2018-10-29 DIAGNOSIS — I5032 Chronic diastolic (congestive) heart failure: Secondary | ICD-10-CM | POA: Diagnosis not present

## 2018-10-29 NOTE — Assessment & Plan Note (Signed)
History of obstructive sleep apnea on CPAP. 

## 2018-10-29 NOTE — Assessment & Plan Note (Signed)
History of diastolic heart failure on diuretics.  He does not appear to be volume overloaded today.

## 2018-10-29 NOTE — Patient Instructions (Addendum)
Medication Instructions:  Your physician recommends that you continue on your current medications as directed. Please refer to the Current Medication list given to you today.  If you need a refill on your cardiac medications before your next appointment, please call your pharmacy.   Lab work: Your physician recommends that you return for lab work in: 4-6 weeks (LIPID AND LIVER PANELS)  If you have labs (blood work) drawn today and your tests are completely normal, you will receive your results only by: Marland Kitchen MyChart Message (if you have MyChart) OR . A paper copy in the mail If you have any lab test that is abnormal or we need to change your treatment, we will call you to review the results.  Testing/Procedures: Your physician has requested that you have an echocardiogram. Echocardiography is a painless test that uses sound waves to create images of your heart. It provides your doctor with information about the size and shape of your heart and how well your heart's chambers and valves are working. This procedure takes approximately one hour. There are no restrictions for this procedure.    Follow-Up: At [facility], you and your health needs are our priority.  As part of our continuing mission to provide you with exceptional heart care, we have created designated Provider Care Teams.  These Care Teams include your primary Cardiologist (physician) and Advanced Practice Providers (APPs -  Physician Assistants and Nurse Practitioners) who all work together to provide you with the care you need, when you need it. . You will need a follow up appointment in 3 months with an APP and in 6 months with Dr. Gwenlyn Found.  Please call our office 2 months in advance to schedule this appointment.  You may see Dr. Gwenlyn Found or one of the following Advanced Practice Providers on your designated Care Team:   . Kerin Ransom, [place] . Almyra Deforest, PA-C . Fabian Sharp, PA-C . Jory Sims, DNP . Rosaria Ferries, PA-C . Roby Lofts, PA-C . Sande Rives, PA-C   ADDITIONAL INFORMATION: PLEASE FOLLOW UP WITH DR. ALLRED IN 4-6 WEEKS TO DISCUSS CARDIOVERSION

## 2018-10-29 NOTE — Progress Notes (Signed)
10/29/2018 [name]   1950-09-23  [id number]  Primary Physician Lance Carol, MD Primary Cardiologist: Lance Harp MD [name], [place]  HPI:  [name] is a 68 y.o.   severely overweight married Caucasian male father of 1, grandfather to 3 grandchildren works as a Arts development officer. Hewasreferred by Dr. Maudry Andrews for evaluation of new-onset atrial fibrillation.I last saw him in the office  06/30/2018.His factors include hypertension. He has obstructive sleep apnea on CPAP. He has had 3right hip replacements in the past. He's never had a heart attack or stroke he denies chest pain or shortness of breath. Dr. Delfina Andrews began him on low-dose beta blockade. I began him on Xarelto oral anticoagulation. He'll ultimately underwent outpatient direct current cardioversion by Dr. Sallyanne Andrews with 1 shock of 120 J successfully to sinus rhythm. He ultimately reverted back to atrial fibrillation. A 2-D echocardiogram revealed normal LV systolic function with moderate to severe left atrial lodgment. A Myoview stress test suggested inferior scar without ischemia. He has seen Dr. Rayann Andrews over the last 5 years andhas been on Tikosyn in the past without benefit.   He was admitted to the hospital for 1 day 04/21/2018 for right left heart cath and TEE. This showed normal coronary arteries, elevated filling pressures and wedge pressure. The TEE did show moderate to severe MR and AI with an EF of 35 to 40%.   He underwent aortic and mitral valve repair as well as left atrial surgical maze procedure by Dr. Roxy Andrews 05/18/2018 with an excellent result.   His postop echo performed 05/24/2018 revealed normal LV size and function with mild LVH and a well-functioning aortic and mitral valve repair.    Since I saw him in the office 4 months ago he is done well.  He has been gradually increasing his activity and is noticed in the last several weeks that while walking in the [place]  he is unusually short of breath which is a new finding.  Apparently his amiodarone was stopped as well.  He is currently on Xarelto for his atrial fibrillation.   Current Meds  Medication Sig  . aspirin EC 81 MG tablet Take 81 mg by mouth daily.  . furosemide (LASIX) 40 MG tablet Take 2 tablets (80 mg total) by mouth 2 (two) times daily. (Patient taking differently: Take 80 mg by mouth 2 (two) times daily. )  . Omega-3 Fatty Acids (FISH OIL) 1000 MG CAPS Take 2,000 mg by mouth daily.  . Potassium Chloride ER 20 MEQ TBCR Take 20-40 mEq by mouth See admin instructions. Tues and Thursday, taking 40 meq, and all other days taking 20 meq daily  . Probiotic Product (PROBIOTIC PO) Take 1 capsule by mouth as needed.   . rivaroxaban (XARELTO) 20 MG TABS tablet Take 1 tablet (20 mg total) by mouth daily with supper.     Allergies  Allergen Reactions  . Oxycodone Rash and Other (See Comments)    Can tolerate Hydrocodone    Social History   Socioeconomic History  . Marital status: Married    Spouse name: Not on file  . Number of children: Not on file  . Years of education: Not on file  . Highest education level: Not on file  Occupational History  . Occupation: Agricultural consultant  Social Needs  . Financial resource strain: Not on file  . Food insecurity:    Worry: Not on file    Inability: Not on  file  . Transportation needs:    Medical: Not on file    Non-medical: Not on file  Tobacco Use  . Smoking status: Never Smoker  . Smokeless tobacco: Never Used  Substance and Sexual Activity  . Alcohol use: No  . Drug use: No  . Sexual activity: Yes  Lifestyle  . Physical activity:    Days per week: Not on file    Minutes per session: Not on file  . Stress: Not on file  Relationships  . Social connections:    Talks on phone: Not on file    Gets together: Not on file    Attends religious service: Not on file    Active member of club or organization: Not on file     Attends meetings of clubs or organizations: Not on file    Relationship status: Not on file  . Intimate partner violence:    Fear of current or ex partner: Not on file    Emotionally abused: Not on file    Physically abused: Not on file    Forced sexual activity: Not on file  Other Topics Concern  . Not on file  Social History Narrative   Pt lives in [place] with spouse.  Leadership training and behavioral safety training.     Review of Systems: General: negative for chills, fever, night sweats or weight changes.  Cardiovascular: negative for chest pain, dyspnea on exertion, edema, orthopnea, palpitations, paroxysmal nocturnal dyspnea or shortness of breath Dermatological: negative for rash Respiratory: negative for cough or wheezing Urologic: negative for hematuria Abdominal: negative for nausea, vomiting, diarrhea, bright red blood per rectum, melena, or hematemesis Neurologic: negative for visual changes, syncope, or dizziness All other systems reviewed and are otherwise negative except as noted above.    Blood pressure (!) 158/58, pulse 63, resp. rate 12, height 5\' 10"  (1.778 m), weight 259 lb 12.8 oz (117.8 kg), SpO2 96 %.  General appearance: alert and no distress Neck: no adenopathy, no carotid bruit, no JVD, supple, symmetrical, trachea midline and thyroid not enlarged, symmetric, no tenderness/mass/nodules Lungs: clear to auscultation bilaterally Heart: irregularly irregular rhythm Extremities: extremities normal, atraumatic, no cyanosis or edema Pulses: 2+ and symmetric Skin: Skin color, texture, turgor normal. No rashes or lesions Neurologic: Alert and oriented X 3, normal strength and tone. Normal symmetric reflexes. Normal coordination and gait  EKG atrial fibrillation with a ventricular spots of 82 and left bundle branch block.  I thought that I saw P waves although I rhythm strip subsequently done showed slow A. fib in the in the high 40s.  I personally reviewed  this EKG  ASSESSMENT AND PLAN:   Obstructive sleep apnea History of obstructive sleep apnea on CPAP  Atrial fibrillation, persistent History of persistent atrial fibrillation status post biatrial maze procedure and left atrial appendage removal.  He was on amiodarone postop which has been discontinued.  He remains in A. fib with a controlled ventricular response on Xarelto.  Hypertensive cardiovascular disease History of essential hypertension with blood pressure measured today at 158/58 although at home his blood pressure is better controlled.  He currently is not on antihypertensive medications.  Chronic diastolic congestive heart failure (HCC) History of diastolic heart failure on diuretics.  He does not appear to be volume overloaded today.  Acute on chronic combined systolic (congestive) and diastolic (congestive) heart failure (HCC) His immediate postop echo showed an EF of 45 to 50%.  Aortic insufficiency Status post aortic and mitral valve repair with annuloplasty  rings by Dr. Roxy Andrews on 05/18/2018 with no evidence of CAD by prior catheterization.  He did have atrial maze procedure and left atrial appendage removal at the time of surgery.      Lance Harp MD FACP,FACC,FAHA, [facility] 10/29/2018 8:37 AM

## 2018-10-29 NOTE — Addendum Note (Signed)
Addended by: Isa Rankin D on: 10/29/2018 01:27 PM   Modules accepted: Orders

## 2018-10-29 NOTE — Assessment & Plan Note (Signed)
Status post aortic and mitral valve repair with annuloplasty rings by Dr. Roxy Manns on 05/18/2018 with no evidence of CAD by prior catheterization.  He did have atrial maze procedure and left atrial appendage removal at the time of surgery.

## 2018-10-29 NOTE — Assessment & Plan Note (Signed)
His immediate postop echo showed an EF of 45 to 50%.

## 2018-10-29 NOTE — Assessment & Plan Note (Signed)
History of essential hypertension with blood pressure measured today at 158/58 although at home his blood pressure is better controlled.  He currently is not on antihypertensive medications.

## 2018-10-29 NOTE — Telephone Encounter (Signed)
Called and left message for pt regarding cardiac rehab and the  continued closure due to Covid-19  adherence of national recommendation in group settings.  Requested call back for interest in participating and rescheduling orientation now that he is medically stable and appropriate  - pt cardiac event was in November. Cherre Huger, BSN Cardiac and Training and development officer

## 2018-10-29 NOTE — Assessment & Plan Note (Signed)
History of persistent atrial fibrillation status post biatrial maze procedure and left atrial appendage removal.  He was on amiodarone postop which has been discontinued.  He remains in A. fib with a controlled ventricular response on Xarelto.

## 2018-11-01 ENCOUNTER — Other Ambulatory Visit: Payer: Self-pay

## 2018-11-01 ENCOUNTER — Telehealth (HOSPITAL_COMMUNITY): Payer: Self-pay | Admitting: *Deleted

## 2018-11-01 ENCOUNTER — Ambulatory Visit (HOSPITAL_COMMUNITY): Payer: Medicare HMO | Attending: Cardiovascular Disease

## 2018-11-01 ENCOUNTER — Ambulatory Visit (HOSPITAL_COMMUNITY): Payer: Medicare HMO

## 2018-11-01 DIAGNOSIS — I110 Hypertensive heart disease with heart failure: Secondary | ICD-10-CM | POA: Diagnosis present

## 2018-11-01 DIAGNOSIS — I4819 Other persistent atrial fibrillation: Secondary | ICD-10-CM | POA: Insufficient documentation

## 2018-11-01 DIAGNOSIS — Z9889 Other specified postprocedural states: Secondary | ICD-10-CM | POA: Diagnosis not present

## 2018-11-01 DIAGNOSIS — G4733 Obstructive sleep apnea (adult) (pediatric): Secondary | ICD-10-CM | POA: Diagnosis not present

## 2018-11-01 NOTE — Telephone Encounter (Signed)
Pt returned call (message left on voicemail).  Pt is interested in participating in CR.  Aware that he will be contacted when we are permitted to resume scheduling. Cherre Huger, BSN Cardiac and Training and development officer

## 2018-11-03 ENCOUNTER — Ambulatory Visit (HOSPITAL_COMMUNITY): Payer: Medicare HMO

## 2018-11-03 ENCOUNTER — Telehealth (HOSPITAL_COMMUNITY): Payer: Self-pay | Admitting: Cardiac Rehabilitation

## 2018-11-03 NOTE — Telephone Encounter (Signed)
Phone call to patient to discuss outpatient cardiac rehab, continued departmental closure for COVID 19 precautions. Left message on answering machine.   Andi Hence, RN, BSN Cardiac Pulmonary Rehab

## 2018-11-04 ENCOUNTER — Other Ambulatory Visit: Payer: Self-pay | Admitting: Cardiovascular Disease

## 2018-11-04 NOTE — Telephone Encounter (Signed)
Furosemide  40 mg refilled 

## 2018-11-05 ENCOUNTER — Ambulatory Visit (HOSPITAL_COMMUNITY): Payer: Medicare HMO

## 2018-11-08 ENCOUNTER — Ambulatory Visit (HOSPITAL_COMMUNITY): Payer: Medicare HMO

## 2018-11-10 ENCOUNTER — Ambulatory Visit (HOSPITAL_COMMUNITY): Payer: Medicare HMO

## 2018-11-16 ENCOUNTER — Other Ambulatory Visit: Payer: Self-pay

## 2018-11-16 DIAGNOSIS — I110 Hypertensive heart disease with heart failure: Secondary | ICD-10-CM

## 2018-11-16 DIAGNOSIS — Z13220 Encounter for screening for lipoid disorders: Secondary | ICD-10-CM | POA: Diagnosis not present

## 2018-11-16 DIAGNOSIS — I4819 Other persistent atrial fibrillation: Secondary | ICD-10-CM

## 2018-11-16 NOTE — Progress Notes (Signed)
Notes recorded by Lorretta Harp, MD on 11/01/2018 at 2:37 PM EDT Aortic and mitral valve appears stable. Mild to moderate LV dysfunction, stable as well. Repeat 12 months

## 2018-11-16 NOTE — H&P (View-Only) (Signed)
Notes recorded by Lorretta Harp, MD on 11/01/2018 at 2:37 PM EDT Aortic and mitral valve appears stable. Mild to moderate LV dysfunction, stable as well. Repeat 12 months

## 2018-11-17 ENCOUNTER — Other Ambulatory Visit: Payer: Self-pay | Admitting: Cardiovascular Disease

## 2018-11-17 ENCOUNTER — Other Ambulatory Visit: Payer: Medicare HMO | Admitting: *Deleted

## 2018-11-17 ENCOUNTER — Other Ambulatory Visit: Payer: Self-pay

## 2018-11-17 LAB — LIPID PANEL
Chol/HDL Ratio: 3.4 ratio (ref 0.0–5.0)
Cholesterol, Total: 126 mg/dL (ref 100–199)
HDL: 37 mg/dL — ABNORMAL LOW (ref >39)
LDL Calculated: 74 mg/dL (ref 0–99)
Triglycerides: 74 mg/dL (ref 0–149)
VLDL Cholesterol Cal: 15 mg/dL (ref 5–40)

## 2018-11-17 LAB — HEPATIC FUNCTION PANEL
ALT: 24 IU/L (ref 0–44)
AST: 29 IU/L (ref 0–40)
Albumin: 4.1 g/dL (ref 3.8–4.8)
Alkaline Phosphatase: 55 IU/L (ref 39–117)
Bilirubin Total: 0.9 mg/dL (ref 0.0–1.2)
Bilirubin, Direct: 0.28 mg/dL (ref 0.00–0.40)
Total Protein: 5.9 g/dL — ABNORMAL LOW (ref 6.0–8.5)

## 2018-11-23 ENCOUNTER — Telehealth: Payer: Self-pay

## 2018-11-23 NOTE — Telephone Encounter (Signed)
Covid Testing Scheduled for May 15th @ 10:05 am Patient has an appointment with Surgeon Monday May 18th and would like to know what to do about that appointment.

## 2018-11-23 NOTE — Telephone Encounter (Signed)
LM notifying of scheduled DCCV.  Will send mychart message.

## 2018-11-23 NOTE — Telephone Encounter (Signed)
LVMTCB about scheduling Pre Procedure COVID19 Labs

## 2018-11-23 NOTE — Telephone Encounter (Signed)
Follow up   Patient is returning your call. Please call the patient.

## 2018-11-26 ENCOUNTER — Other Ambulatory Visit (HOSPITAL_COMMUNITY)
Admission: RE | Admit: 2018-11-26 | Discharge: 2018-11-26 | Disposition: A | Discharge status: Home / Self Care | Payer: Medicare HMO | Source: Ambulatory Visit | Attending: Cardiology | Admitting: Cardiology

## 2018-11-26 DIAGNOSIS — Z1159 Encounter for screening for other viral diseases: Secondary | ICD-10-CM | POA: Insufficient documentation

## 2018-11-27 LAB — NOVEL CORONAVIRUS, NAA (HOSP ORDER, SEND-OUT TO REF LAB; TAT 18-24 HRS): SARS-CoV-2, NAA: NOT DETECTED

## 2018-11-29 ENCOUNTER — Ambulatory Visit: Payer: Medicare HMO | Admitting: Thoracic Surgery (Cardiothoracic Vascular Surgery)

## 2018-11-29 ENCOUNTER — Other Ambulatory Visit (HOSPITAL_COMMUNITY): Payer: Medicare HMO

## 2018-11-30 ENCOUNTER — Ambulatory Visit (HOSPITAL_COMMUNITY): Payer: Medicare HMO | Admitting: Certified Registered"

## 2018-11-30 ENCOUNTER — Encounter (HOSPITAL_COMMUNITY): Payer: Self-pay | Admitting: *Deleted

## 2018-11-30 ENCOUNTER — Encounter (HOSPITAL_COMMUNITY)
Admission: RE | Disposition: A | Discharge status: Home / Self Care | Payer: Self-pay | Source: Home / Self Care | Attending: Cardiology

## 2018-11-30 ENCOUNTER — Other Ambulatory Visit: Payer: Self-pay

## 2018-11-30 ENCOUNTER — Ambulatory Visit (HOSPITAL_COMMUNITY)
Admission: RE | Admit: 2018-11-30 | Discharge: 2018-11-30 | Disposition: A | Discharge status: Home / Self Care | Payer: Medicare HMO | Attending: Cardiology | Admitting: Cardiology

## 2018-11-30 DIAGNOSIS — Z7982 Long term (current) use of aspirin: Secondary | ICD-10-CM | POA: Diagnosis not present

## 2018-11-30 DIAGNOSIS — I110 Hypertensive heart disease with heart failure: Secondary | ICD-10-CM | POA: Diagnosis not present

## 2018-11-30 DIAGNOSIS — I4819 Other persistent atrial fibrillation: Secondary | ICD-10-CM | POA: Diagnosis not present

## 2018-11-30 DIAGNOSIS — M1990 Unspecified osteoarthritis, unspecified site: Secondary | ICD-10-CM | POA: Diagnosis not present

## 2018-11-30 DIAGNOSIS — I5042 Chronic combined systolic (congestive) and diastolic (congestive) heart failure: Secondary | ICD-10-CM | POA: Insufficient documentation

## 2018-11-30 DIAGNOSIS — G4733 Obstructive sleep apnea (adult) (pediatric): Secondary | ICD-10-CM | POA: Insufficient documentation

## 2018-11-30 DIAGNOSIS — Z79899 Other long term (current) drug therapy: Secondary | ICD-10-CM | POA: Diagnosis not present

## 2018-11-30 DIAGNOSIS — I5033 Acute on chronic diastolic (congestive) heart failure: Secondary | ICD-10-CM | POA: Diagnosis not present

## 2018-11-30 DIAGNOSIS — I351 Nonrheumatic aortic (valve) insufficiency: Secondary | ICD-10-CM | POA: Insufficient documentation

## 2018-11-30 DIAGNOSIS — I4891 Unspecified atrial fibrillation: Secondary | ICD-10-CM | POA: Diagnosis not present

## 2018-11-30 DIAGNOSIS — Z7901 Long term (current) use of anticoagulants: Secondary | ICD-10-CM | POA: Diagnosis not present

## 2018-11-30 HISTORY — PX: CARDIOVERSION: SHX1299

## 2018-11-30 LAB — BASIC METABOLIC PANEL
Anion gap: 9 (ref 5–15)
BUN: 30 mg/dL — ABNORMAL HIGH (ref 8–23)
CO2: 27 mmol/L (ref 22–32)
Calcium: 9.2 mg/dL (ref 8.9–10.3)
Chloride: 103 mmol/L (ref 98–111)
Creatinine, Ser: 1.1 mg/dL (ref 0.61–1.24)
GFR calc Af Amer: >60 mL/min (ref >60)
GFR calc non Af Amer: >60 mL/min (ref >60)
Glucose, Bld: 111 mg/dL — ABNORMAL HIGH (ref 70–99)
Potassium: 3.7 mmol/L (ref 3.5–5.1)
Sodium: 139 mmol/L (ref 135–145)

## 2018-11-30 LAB — CBC WITH DIFFERENTIAL/PLATELET
Abs Immature Granulocytes: 0.03 10*3/uL (ref 0.00–0.07)
Basophils Absolute: 0 10*3/uL (ref 0.0–0.1)
Basophils Relative: 1 %
Eosinophils Absolute: 0.2 10*3/uL (ref 0.0–0.5)
Eosinophils Relative: 3 %
HCT: 38.7 % — ABNORMAL LOW (ref 39.0–52.0)
Hemoglobin: 12.1 g/dL — ABNORMAL LOW (ref 13.0–17.0)
Immature Granulocytes: 0 %
Lymphocytes Relative: 26 %
Lymphs Abs: 2.1 10*3/uL (ref 0.7–4.0)
MCH: 25.9 pg — ABNORMAL LOW (ref 26.0–34.0)
MCHC: 31.3 g/dL (ref 30.0–36.0)
MCV: 82.7 fL (ref 80.0–100.0)
Monocytes Absolute: 1 10*3/uL (ref 0.1–1.0)
Monocytes Relative: 12 %
Neutro Abs: 4.6 10*3/uL (ref 1.7–7.7)
Neutrophils Relative %: 58 %
Platelets: 157 10*3/uL (ref 150–400)
RBC: 4.68 MIL/uL (ref 4.22–5.81)
RDW: 15.2 % (ref 11.5–15.5)
WBC: 7.9 10*3/uL (ref 4.0–10.5)
nRBC: 0 % (ref 0.0–0.2)

## 2018-11-30 SURGERY — CARDIOVERSION
Anesthesia: General

## 2018-11-30 MED ORDER — LIDOCAINE HCL (CARDIAC) PF 100 MG/5ML IV SOSY
PREFILLED_SYRINGE | INTRAVENOUS | Status: DC | PRN
  Administered 2018-11-30: 20 mg via INTRAVENOUS

## 2018-11-30 MED ORDER — SODIUM CHLORIDE 0.9 % IV SOLN
INTRAVENOUS | Status: DC
  Administered 2018-11-30: 08:00:00 via INTRAVENOUS

## 2018-11-30 MED ORDER — PROPOFOL 10 MG/ML IV BOLUS
INTRAVENOUS | Status: DC | PRN
  Administered 2018-11-30: 20 mg via INTRAVENOUS
  Administered 2018-11-30: 30 mg via INTRAVENOUS
  Administered 2018-11-30: 70 mg via INTRAVENOUS

## 2018-11-30 MED ORDER — SODIUM CHLORIDE 0.9 % IV SOLN
INTRAVENOUS | Status: DC | PRN
  Administered 2018-11-30: 08:00:00 via INTRAVENOUS

## 2018-11-30 NOTE — CV Procedure (Signed)
   Cardioversion Note  AMANDA POTE [id number] 06-10-1951  Procedure: DC Cardioversion Indications: atrial fibrillation  Procedure Details Consent: Obtained Time Out: Verified patient identification, verified procedure, site/side was marked, verified correct patient position, special equipment/implants available, Radiology Safety Procedures followed,  medications/allergies/relevent history reviewed, required imaging and test results available.  Performed  The patient has been on adequate anticoagulation.  The patient received IV propofol for sedation.  Synchronous cardioversion was performed at 200 joules.  The cardioversion was unsuccessful.   Complications: No apparent complications Patient did tolerate procedure well.   Ena Dawley, MD, [facility] 11/30/2018, 8:48 AM

## 2018-11-30 NOTE — Discharge Instructions (Signed)
Electrical Cardioversion, Care After °This sheet gives you information about how to care for yourself after your procedure. Your health care provider may also give you more specific instructions. If you have problems or questions, contact your health care provider. °What can I expect after the procedure? °After the procedure, it is common to have: °· Some redness on the skin where the shocks were given. °Follow these instructions at home: ° °· Do not drive for 24 hours if you were given a medicine to help you relax (sedative). °· Take over-the-counter and prescription medicines only as told by your health care provider. °· Ask your health care provider how to check your pulse. Check it often. °· Rest for 48 hours after the procedure or as told by your health care provider. °· Avoid or limit your caffeine use as told by your health care provider. °Contact a health care provider if: °· You feel like your heart is beating too quickly or your pulse is not regular. °· You have a serious muscle cramp that does not go away. °Get help right away if: ° °· You have discomfort in your chest. °· You are dizzy or you feel faint. °· You have trouble breathing or you are short of breath. °· Your speech is slurred. °· You have trouble moving an arm or leg on one side of your body. °· Your fingers or toes turn cold or blue. °This information is not intended to replace advice given to you by your health care provider. Make sure you discuss any questions you have with your health care provider. °Document Released: 04/20/2013 Document Revised: 02/01/2016 Document Reviewed: 01/04/2016 °Elsevier Interactive Patient Education © 2019 [employer] ° °

## 2018-11-30 NOTE — Transfer of Care (Signed)
Immediate Anesthesia Transfer of Care Note  Patient: [name]  Procedure(s) Performed: CARDIOVERSION (N/A )  Patient Location: Endoscopy Unit  Anesthesia Type:General  Level of Consciousness: awake and drowsy  Airway & Oxygen Therapy: Patient Spontanous Breathing  Post-op Assessment: Report given to RN, Post -op Vital signs reviewed and stable and Patient moving all extremities X 4  Post vital signs: Reviewed and stable  Last Vitals:  Vitals Value Taken Time  BP    Temp    Pulse    Resp    SpO2      Last Pain:  Vitals:   11/30/18 0734  TempSrc: Oral  PainSc: 0-No pain         Complications: No apparent anesthesia complications

## 2018-11-30 NOTE — Anesthesia Postprocedure Evaluation (Signed)
Anesthesia Post Note  Patient: [name]  Procedure(s) Performed: CARDIOVERSION (N/A )     Patient location during evaluation: PACU Anesthesia Type: General Level of consciousness: awake and alert Pain management: pain level controlled Vital Signs Assessment: post-procedure vital signs reviewed and stable Respiratory status: spontaneous breathing, nonlabored ventilation, respiratory function stable and patient connected to nasal cannula oxygen Cardiovascular status: blood pressure returned to baseline and stable Postop Assessment: no apparent nausea or vomiting Anesthetic complications: no    Last Vitals:  Vitals:   11/30/18 0845 11/30/18 0900  BP: (!) 145/77 (!) 145/67  Pulse:  (!) 40  Resp: 15 (!) 21  Temp: 36.8 C   SpO2: 96% 96%    Last Pain:  Vitals:   11/30/18 0900  TempSrc:   PainSc: 0-No pain                 Tiajuana Amass

## 2018-11-30 NOTE — Anesthesia Preprocedure Evaluation (Signed)
Anesthesia Evaluation  Patient identified by MRN, date of birth, ID band Patient awake    Reviewed: Allergy & Precautions, H&P , NPO status , Patient's Chart, lab work & pertinent test results, reviewed documented beta blocker date and time   History of Anesthesia Complications (+) PONV  Airway Mallampati: III  TM Distance: >3 FB Neck ROM: Full    Dental no notable dental hx. (+) Teeth Intact, Dental Advisory Given   Pulmonary sleep apnea and Continuous Positive Airway Pressure Ventilation ,    Pulmonary exam normal breath sounds clear to auscultation       Cardiovascular Exercise Tolerance: Good hypertension, Pt. on medications and Pt. on home beta blockers +CHF  negative cardio ROS  + dysrhythmias Atrial Fibrillation + Valvular Problems/Murmurs AI and MR  Rhythm:Irregular Rate:Normal     Neuro/Psych negative neurological ROS  negative psych ROS   GI/Hepatic negative GI ROS, Neg liver ROS,   Endo/Other  negative endocrine ROS  Renal/GU negative Renal ROS  negative genitourinary   Musculoskeletal  (+) Arthritis , Osteoarthritis,    Abdominal   Peds  Hematology negative hematology ROS (+)   Anesthesia Other Findings   Reproductive/Obstetrics negative OB ROS                             Anesthesia Physical  Anesthesia Plan  ASA: III  Anesthesia Plan: General   Post-op Pain Management:    Induction: Intravenous  PONV Risk Score and Plan: 3 and Treatment may vary due to age or medical condition  Airway Management Planned: Mask and Natural Airway  Additional Equipment:   Intra-op Plan:   Post-operative Plan:   Informed Consent: I have reviewed the patients History and Physical, chart, labs and discussed the procedure including the risks, benefits and alternatives for the proposed anesthesia with the patient or authorized representative who has indicated his/her understanding  and acceptance.       Plan Discussed with: CRNA  Anesthesia Plan Comments:         Anesthesia Quick Evaluation

## 2018-11-30 NOTE — Interval H&P Note (Signed)
History and Physical Interval Note:  11/30/2018 8:19 AM  Birder Robson  has presented today for surgery, with the diagnosis of AFIB.  The various methods of treatment have been discussed with the patient and family. After consideration of risks, benefits and other options for treatment, the patient has consented to  Procedure(s): CARDIOVERSION (N/A) as a surgical intervention.  The patient's history has been reviewed, patient examined, no change in status, stable for surgery.  I have reviewed the patient's chart and labs.  Questions were answered to the patient's satisfaction.     Ena Dawley

## 2018-12-01 ENCOUNTER — Encounter (HOSPITAL_COMMUNITY): Payer: Self-pay | Admitting: Cardiology

## 2018-12-09 DIAGNOSIS — I10 Essential (primary) hypertension: Secondary | ICD-10-CM | POA: Diagnosis not present

## 2018-12-09 DIAGNOSIS — Z952 Presence of prosthetic heart valve: Secondary | ICD-10-CM | POA: Diagnosis not present

## 2018-12-09 DIAGNOSIS — G4733 Obstructive sleep apnea (adult) (pediatric): Secondary | ICD-10-CM | POA: Diagnosis not present

## 2018-12-09 DIAGNOSIS — I480 Paroxysmal atrial fibrillation: Secondary | ICD-10-CM | POA: Diagnosis not present

## 2018-12-09 DIAGNOSIS — Z7901 Long term (current) use of anticoagulants: Secondary | ICD-10-CM | POA: Diagnosis not present

## 2018-12-09 DIAGNOSIS — I5023 Acute on chronic systolic (congestive) heart failure: Secondary | ICD-10-CM | POA: Diagnosis not present

## 2018-12-09 DIAGNOSIS — G4730 Sleep apnea, unspecified: Secondary | ICD-10-CM | POA: Diagnosis not present

## 2018-12-09 DIAGNOSIS — R0600 Dyspnea, unspecified: Secondary | ICD-10-CM | POA: Diagnosis not present

## 2018-12-13 ENCOUNTER — Encounter: Payer: Self-pay | Admitting: Thoracic Surgery (Cardiothoracic Vascular Surgery)

## 2018-12-13 ENCOUNTER — Ambulatory Visit: Payer: Medicare HMO | Admitting: Thoracic Surgery (Cardiothoracic Vascular Surgery)

## 2018-12-13 ENCOUNTER — Other Ambulatory Visit: Payer: Self-pay

## 2018-12-13 VITALS — BP 130/64 | HR 60 | Temp 97.9°F | Resp 16 | Ht 70.0 in | Wt 260.0 lb

## 2018-12-13 DIAGNOSIS — Z8679 Personal history of other diseases of the circulatory system: Secondary | ICD-10-CM

## 2018-12-13 DIAGNOSIS — Z9889 Other specified postprocedural states: Secondary | ICD-10-CM

## 2018-12-13 NOTE — Patient Instructions (Addendum)
Continue all previous medications without any changes at this time  Check your weight on a regular basis and keep a log for your records.  Look for signs of fluid overload such as worsening swelling of your lower legs, increased shortness of breath with activity, and/or a dry nonproductive cough.  Discussed these findings with your cardiologist including whether or not you should adjust your fluid pill dosage (diuretic).   

## 2018-12-13 NOTE — Progress Notes (Signed)
[address]       [name],[place] [address]             [phone]     CARDIOTHORACIC SURGERY OFFICE NOTE  Referring Provider is Thompson Grayer, MD Primary Cardiologist is Quay Burow, MD PCP is Seward Carol, MD   HPI:  Patient is a 68 year old obese male with history ofaortic insufficiency, mitral regurgitation, long-standing persistent atrial fibrillation,hypertension,chronic diastolic congestive heart failure, and obstructive sleep apnea whoreturns to the office today for follow-up of aortic valve repair, mitral valve repair, and Maze procedure on May 18, 2018.  He was last seen here in our office August 02, 2018 at which time he remained in rate controlled atrial fibrillation but clinically seem to be doing very well.  Coumadin was stopped and Xarelto resumed for long-term anticoagulation at that time.  Amiodarone had been stopped because of bradycardia.  He was seen by Dr. Rayann Heman in February and doing fairly well clinically, although he remained in atrial fibrillation at that time.  An attempt at DC cardioversion was recommended but subsequently postponed because of the COVID-19 pandemic.  At that time the patient decided to decrease his dose of oral Lasix without any specific advice from any of his providers.  Over the past 2 months he has developed worsening exertional shortness of breath and orthopnea.  He has had more difficulty sleeping at night.  He was seen in follow-up by Dr. Gwenlyn Found October 29, 2018.  A follow-up transthoracic echocardiogram was performed and cardioversion was rescheduled for Nov 30, 2018.  Cardioversion was unsuccessful.  The patient returns her office today for follow-up.  Patient states that he recently had a virtual office visit with his primary care physician who recommended that the patient resume taking Lasix at his previous dose.  Over the past 2 weeks he lost 11 pounds in weight.  Breathing has improved some.  He has not had any  chest pain or chest tightness.  He no longer has any palpitations.  He denies any fevers or productive cough.  He has not been traveling and he has not been exposed to any persons with known or suspected COVID-19 infection.    Current Outpatient Medications  Medication Sig Dispense Refill   aspirin EC 81 MG tablet Take 81 mg by mouth daily.     clobetasol ointment (TEMOVATE) 8.52 % Apply 1 application topically daily as needed (rash).     furosemide (LASIX) 40 MG tablet TAKE 2 TABLETS BY MOUTH 2 TIMES DAILY (Patient taking differently: Take 80 mg by mouth 2 (two) times daily. ) 120 tablet 6   Multiple Vitamin (MULTIVITAMIN WITH MINERALS) TABS tablet Take 1 tablet by mouth 2 (two) times a week.     Naproxen Sod-diphenhydrAMINE (ALEVE PM) 220-25 MG TABS Take 2 tablets by mouth at bedtime as needed (sleep/pain).     Omega-3 Fatty Acids (FISH OIL PO) Take 1 capsule by mouth 2 (two) times a week.     Potassium Chloride ER 20 MEQ TBCR Take 20-40 mEq by mouth See admin instructions. Tues and Thursday, taking 40 meq, and all other days taking 20 meq daily (Patient taking differently: Take 20-40 mEq by mouth See admin instructions. Take 20 meq once daily except Tues and Thurs take 20 meq twice daily) 130 tablet 3   XARELTO 20 MG TABS tablet TAKE 1 TABLET BY MOUTH ONCE DAILY (Patient taking differently: Take 20 mg by mouth daily. ) 30 tablet 5   No  current facility-administered medications for this visit.       Physical Exam:   BP 130/64 (BP Location: Right Arm, Patient Position: Sitting, Cuff Size: Large)    Pulse 60    Temp 97.9 F (36.6 C) (Skin)    Resp 16    Ht 5\' 10"  (1.778 m)    Wt 260 lb (117.9 kg)    SpO2 93% Comment: RA   BMI 37.31 kg/m   General:  Obese but well-appearing  Chest:   Clear to auscultation  CV:   Regular rate and rhythm without murmur  Incisions:  Completely healed  Abdomen:  Soft nontender  Extremities:  Warm and well perfused with trace lower extremity  edema  Diagnostic Tests:   ECHOCARDIOGRAM REPORT       Patient Name:   [name] Date of Exam: 11/01/2018 Medical Rec #:  [id number]       Height:       70.0 in Accession #:    [id number]      Weight:       259.8 lb Date of Birth:  1951-05-01       BSA:          2.33 m Patient Age:    35 years        BP:           158/58 mmHg Patient Gender: M               HR:           50 bpm. Exam Location:  [facility]    Procedure: 2D Echo, 3D Echo, Cardiac Doppler and Color Doppler  Indications:    I48.19 Atrial Fibrillation                 I11.0 Hypertensive Heart Failure   History:        Patient has prior history of Echocardiogram examinations, most                 recent 05/24/2018. CHF Atrial Fibrillation Aortic Valve Disease                 and Mitral Valve Disease Signs/Symptoms: Dyspnea Risk Factors:                 Sleep Apnea and Hypertension. Aortic Valve: A 33mm Biostable                 HART Ring Procedure Date: 05-18-18 Mitral Valve: A 30 mm Sorin                 Memo 4D Ring valve is present in the mitral position. Procedure                 Date: 05/18/18. Obesity, Dizziness, History of Aortic and Mitral                 Valve Repairs with MAZE procedure (05-18-18) now with new                 shortness of breath and fatigue.   Sonographer:    Deliah Boston RDCS Referring Phys: Quay Burow, J  IMPRESSIONS    1. The left ventricle has mild-moderately reduced systolic function, with an ejection fraction of 40-45%. The cavity size was normal. There is mild concentric left ventricular hypertrophy. Left ventricular diastolic function could not be evaluated due  to mitral valve replacement/repair. Left ventrical global hypokinesis without regional wall motion abnormalities.  2. The  right ventricle has mildly reduced systolic function. The cavity was moderately enlarged. There is no increase in right ventricular wall thickness. Right ventricular systolic pressure  is moderately elevated with an estimated pressure of 62 mmHg.  3. A 30 mm Sorin Memo 4D Ring valve is present in the mitral position. Procedure Date: 05/18/18.  4. Mild thickening of the mitral valve leaflet. No evidence of true mitral valve stenosis.  5. A 68mm Biostable HART Ring valve is present in the aortic position. Procedure Date: 05-18-18.  6. The aortic valve is tricuspid. Mild thickening of the aortic valve. Aortic valve regurgitation is mild by color flow Doppler. The jet is eccentric medially directed. Mild stenosis of the aortic valve.  7. The inferior vena cava was dilated in size with <50% respiratory variability.  8. Left atrial size was severely dilated.  9. Right atrial size was severely dilated. 10. Compared to 05/24/2018 (early postop) report, left ventricular systolic function has decreased, but is similar when compared to all other previous echocardiograms  FINDINGS  Left Ventricle: The left ventricle has mild-moderately reduced systolic function, with an ejection fraction of 40-45%. The cavity size was normal. There is mild concentric left ventricular hypertrophy. Left ventricular diastolic function could not be  evaluated due to mitral valve replacement/repair. Left ventrical global hypokinesis without regional wall motion abnormalities. Right Ventricle: The right ventricle has mildly reduced systolic function. The cavity was moderately enlarged. There is no increase in right ventricular wall thickness. Right ventricular systolic pressure is moderately elevated with an estimated pressure  of 62 mmHg. Left Atrium: Left atrial size was severely dilated. Right Atrium: Right atrial size was severely dilated. Right atrial pressure is estimated at 8 mmHg. Interatrial Septum: No atrial level shunt detected by color flow Doppler. Pericardium: There is no evidence of pericardial effusion. Mitral Valve: The mitral valve has been repaired/replaced. Mild thickening of the mitral valve  leaflet. Mitral valve regurgitation is mild by color flow Doppler. No evidence of mitral valve stenosis. A 30 mm Sorin Memo 4D Ring valve is present in the  mitral position. Procedure Date: 05/18/18. Tricuspid Valve: The tricuspid valve is normal in structure. Tricuspid valve regurgitation is mild by color flow Doppler. Aortic Valve: The aortic valve is tricuspid Mild thickening of the aortic valve. Aortic valve regurgitation is mild by color flow Doppler. The jet is eccentric medially directed. There is Mild stenosis of the aortic valve, with a calculated valve area of  1.55 cm. A 32mm Biostable HART Ring valve is present in the aortic position. Procedure Date: 05-18-18. Pulmonic Valve: The pulmonic valve was not well visualized. Pulmonic valve regurgitation is not visualized by color flow Doppler. Venous: The inferior vena cava is dilated in size with less than 50% respiratory variability.   LEFT VENTRICLE PLAX 2D LVIDd:         5.45 cm   Diastology LVIDs:         3.91 cm   LV e' lateral:   7.83 cm/s LV PW:         1.46 cm   LV E/e' lateral: 24.5 LV IVS:        1.35 cm   LV e' medial:    6.42 cm/s LVOT diam:     2.30 cm   LV E/e' medial:  29.9 LV SV:         78 ml LV SV Index:   31.73 LVOT Area:     4.15 cm  RIGHT VENTRICLE RV S prime:  8.92 cm/s TAPSE (M-mode): 1.6 cm RVSP:           61.8 mmHg  LEFT ATRIUM              Index       RIGHT ATRIUM            Index LA diam:        6.20 cm  2.66 cm/m  RA Pressure: 15.00 mmHg LA Vol (A2C):   112.0 ml 48.01 ml/m RA Area:     34.50 cm LA Vol (A4C):   147.0 ml 63.01 ml/m RA Volume:   131.00 ml  56.16 ml/m LA Biplane Vol: 129.0 ml 55.30 ml/m  AORTIC VALVE AV Area (Vmax):    1.51 cm AV Area (Vmean):   1.39 cm AV Area (VTI):     1.55 cm AV Vmax:           258.50 cm/s AV Vmean:          175.500 cm/s AV VTI:            0.654 m AV Peak Grad:      26.7 mmHg AV Mean Grad:      14.5 mmHg LVOT Vmax:         93.70 cm/s LVOT Vmean:         58.850 cm/s LVOT VTI:          0.244 m LVOT/AV VTI ratio: 0.37 AR PHT:            645 msec   AORTA Ao Root diam: 3.70 cm Ao Asc diam:  4.30 cm  MITRAL VALVE                TRICUSPID VALVE MV Area (PHT): 3.19 cm     TR Peak grad:   46.8 mmHg MV Peak grad:  29.2 mmHg    TR Vmax:        351.00 cm/s MV Mean grad:  5.0 mmHg     Estimated RAP:  15.00 mmHg MV Vmax:       2.70 m/s     RVSP:           61.8 mmHg MV Vmean:      83.7 cm/s MV VTI:        0.48 m       SHUNTS MV PHT:        69 msec      Systemic VTI:  0.24 m MV Decel Time: 274 msec     Systemic Diam: 2.30 cm MR Peak grad: 122.3 mmHg MR Mean grad: 78.0 mmHg MR Vmax:      553.00 cm/s MR Vmean:     412.0 cm/s MV E velocity: 192.00 cm/s    Mihai Croitoru MD Electronically signed by Sanda Klein MD Signature Date/Time: 11/01/2018/1:58:28 PM     2 channel telemetry rhythm strip performed in our office today demonstrates wide complex rhythm with what is likely atrial fibrillation, heart rate 60s.    Impression:  Patient is clinically stable nearly 91-month status post aortic valve repair, mitral valve repair, and Maze procedure.  Postoperatively he went back into rate controlled atrial fibrillation and he recently failed an attempt at cardioversion.  He remains anticoagulated using Xarelto.  He is not currently on any beta-blockers, ACE inhibitors, ARB, or other medications for heart failure other than daily Lasix.  He recently experienced increased exertional shortness of breath and orthopnea after he had independently cut back his dose of Lasix.  Symptoms  seem to be improving since he resumed Lasix 80 mg twice daily.  I have personally reviewed the patient's recent follow-up transthoracic echocardiogram.  There is mild to moderate left ventricular systolic dysfunction, and ejection fraction is reportedly 40 to 45% which may be down significantly since his last follow-up echocardiogram performed 6 months ago.  There is  mild residual aortic insufficiency and no significant residual mitral regurgitation.  Mean transvalvular gradients across both the aortic and mitral valves remained stable at 15 and 5 mmHg, respectively.  Plan:  We have not recommended any changes to the patient's current medications.  I have reminded the patient that he should continue to monitor his weight on a daily basis and he should not stop taking Lasix without instruction of his cardiologist.  We directly reviewed images from his recent follow-up echocardiogram.  He might benefit from addition of an ACE inhibitor, ARB, or Entresto to his medical regimen.  Spironolactone could be considered.  All of his questions have been addressed.  The patient will continue to follow-up with Dr. Gwenlyn Found and Dr. Rayann Heman.  He will return to our office for follow-up and rhythm check in 6 months.  He will call and return sooner should specific problems or questions arise.  I spent in excess of 15 minutes during the conduct of this office consultation and >50% of this time involved direct face-to-face encounter with the patient for counseling and/or coordination of their care.   Valentina Gu. Roxy Manns, MD 12/13/2018 1:47 PM

## 2018-12-15 ENCOUNTER — Telehealth: Payer: Self-pay | Admitting: Cardiovascular Disease

## 2018-12-15 DIAGNOSIS — I5032 Chronic diastolic (congestive) heart failure: Secondary | ICD-10-CM

## 2018-12-15 NOTE — Telephone Encounter (Signed)
Please refer to the advanced heart failure clinic.

## 2018-12-15 NOTE — Telephone Encounter (Signed)
Referral to CHF clinic in epic; Contacted pt and informed him that referral in epic and a rep from their office will reach out to him to set up an appt. Pt verbalized understanding

## 2018-12-15 NOTE — Telephone Encounter (Signed)
Returned call to patient he stated Dr.Owen told him Dr.Berry needs to refer him to the heart failure clinic.Advised I will send message to [facility] for a order.

## 2018-12-15 NOTE — Telephone Encounter (Signed)
  Patient would like Dr Gwenlyn Found to refer him to the Heart Failure clinic. He sent a MyChart message on 12/13/18 but has not received a response. Patient would like a response soon as possible.

## 2018-12-27 ENCOUNTER — Telehealth: Payer: Self-pay

## 2018-12-30 ENCOUNTER — Telehealth: Payer: Medicare HMO | Admitting: Internal Medicine

## 2019-01-03 ENCOUNTER — Ambulatory Visit: Payer: Medicare HMO | Admitting: Thoracic Surgery (Cardiothoracic Vascular Surgery)

## 2019-01-07 ENCOUNTER — Telehealth (HOSPITAL_COMMUNITY): Payer: Self-pay

## 2019-01-07 NOTE — Telephone Encounter (Signed)
I don't think he is either. Might want to call him and scope it out

## 2019-01-07 NOTE — Telephone Encounter (Signed)
No response from pt, closed referral. °

## 2019-01-07 NOTE — Telephone Encounter (Signed)
f °

## 2019-01-26 ENCOUNTER — Telehealth: Payer: Self-pay

## 2019-02-10 NOTE — Progress Notes (Addendum)
Cardiology Office Note:    Date:  02/11/2019   ID:  [name], DOB 12-16-1950, MRN [id number]  PCP:  Seward Carol, MD  Cardiologist:  Quay Burow, MD   Referring MD: Seward Carol, MD   Chief Complaint  Patient presents with  . Follow-up    rhythm change    History of Present Illness:    [name] is a 68 y.o. male with a hx of chronic combined systolic and diastolic heart failure, persistent atrial fibrillation, chronic anticoagulation, hypertensive cardiovascular disease, mitral regurgitation, OSA, obesity. Myoview stress test 11/07/16 negative for ischemia.  She underwent AVR, mitral valve repair, maze procedure, and LAA excision on 05/18/18. Amiodarone has been D/C'ed due to bradycardia. Although, per the patient, his HR didn't change after stopping amio.  Coumadin was transitioned to xarelto for long term AC. He remained in rate-controlled Afib after surgery and DCCV was recommended, but deferred due to COVID-19 pandemic. He decreased his lasix on his own when a pharmacist told him the dose was too high and developed worsening exertional SOB and orthopnea. After seeing Dr. Roxy Manns for follow up, he resumed his lasix and believe he went back into rhythm. Follow up echocardiogram 11/01/18 with stable mitral valve, EF of 40-45%, biatrial enlargement.  He was last seen in clinic on 10/29/2018 by Dr. Gwenlyn Found and was doing well at that time.  DCCV on 11/30/18 was unsuccessful. He states that he was CHF exacerbation at that time.   He presents back today for 82-month follow-up. He states he thinks he is in rhythm now and that he is euvolemic. His resting HR when sleeping 38-45 previously, now his resting HR is 87-105 when he is sleeping. He has been working doing Engineer, mining and yard work and has felt well. He shows me his heart rates as recorded by his smartphone/?apple watch. He fluctuates between 40-50 and 100s. He thinks that he is in Afib when his HR is in the 50s. Now that his heart rate is  in the 100s, he think she is in normal rhythm.  He suspects that as fluid came off his heart went back into rhythm on July 24-25. I obtained a 12-lead EKG and rhythm strip. He continues to have LBBB.   Overall, he feels well. No dizziness, palpitations, or near-syncope. No chest pain, shortness of breath, orthopnea, or lower extremity swelling.   Past Medical History:  Diagnosis Date  . Aortic insufficiency 04/21/2018  . Aortic insufficiency   . Arthritis    "joints" (09/26/2013)  . Atrial enlargement, left   . Dyspnea    with exertion  . Hearing aid worn    both ears  . Hypertension   . Mitral regurgitation 04/21/2018  . Obesity   . OSA on CPAP    "mask adjusts to what I need" (09/26/2013)  . Persistent atrial fibrillation    cardioversion 06/06/13  . PONV (postoperative nausea and vomiting)   . Rotator cuff tear, right dec 2012   physical therapy done, decreased strength  . S/P aortic valve repair 05/18/2018   Complex valvuloplasty including plication of right coronary leaflet and 21 mm Biostable HAART annuloplasty ring  . S/P Maze operation for atrial fibrillation 05/18/2018   Complete bilateral atrial lesion set using bipolar radiofrequency and cryothermy ablation with clipping of LA appendage  . S/P mitral valve repair 05/18/2018   Complex valvuloplasty including artificial Gore-tex neochord placement x4 and 30 mm Sorin Memo 4D ring annuloplasty  . Wears glasses  Past Surgical History:  Procedure Laterality Date  . AORTIC VALVE REPAIR N/A 05/18/2018   Procedure: AORTIC VALVE REPAIR using HAART 300 Aortic Annuloplasty Device size 37mm;  Surgeon: Rexene Alberts, MD;  Location: [place];  Service: Open Heart Surgery;  Laterality: N/A;  . CARDIOVERSION N/A 06/06/2013   Procedure: CARDIOVERSION;  Surgeon: Sanda Klein, MD;  Location: [place] [facility];  Service: Cardiovascular;  Laterality: N/A;  . CARDIOVERSION N/A 09/28/2013   Procedure: CARDIOVERSION BEDSIDE;  Surgeon: Peter M  Martinique, MD;  Location: [place];  Service: Cardiovascular;  Laterality: N/A;  . CARDIOVERSION N/A 11/30/2018   Procedure: CARDIOVERSION;  Surgeon: Dorothy Spark, MD;  Location: [facility] END[facility];  Service: Cardiovascular;  Laterality: N/A;  . CARPAL TUNNEL RELEASE Right 08/08/2013   Procedure: RIGHT CARPAL TUNNEL RELEASE;  Surgeon: Wynonia Sours, MD;  Location: [place];  Service: Orthopedics;  Laterality: Right;  . CARPAL TUNNEL RELEASE Left 2008  . CLIPPING OF ATRIAL APPENDAGE  05/18/2018   Procedure: CLIPPING OF ATRIAL APPENDAGE using AtriCure Clip size 45;  Surgeon: Rexene Alberts, MD;  Location: [place];  Service: Open Heart Surgery;;  . KNEE ARTHROSCOPY Right 1990's  . MAZE N/A 05/18/2018   Procedure: MAZE;  Surgeon: Rexene Alberts, MD;  Location: [place];  Service: Open Heart Surgery;  Laterality: N/A;  . MITRAL VALVE REPAIR N/A 05/18/2018   Procedure: MITRAL VALVE REPAIR (MVR) using 4D Memo Ring size 30;  Surgeon: Rexene Alberts, MD;  Location: [place];  Service: Open Heart Surgery;  Laterality: N/A;  . RIGHT/LEFT HEART CATH AND CORONARY ANGIOGRAPHY N/A 04/21/2018   Procedure: RIGHT/LEFT HEART CATH AND CORONARY ANGIOGRAPHY;  Surgeon: Nelva Bush, MD;  Location: [place] [facility];  Service: Cardiovascular;  Laterality: N/A;  . SHOULDER OPEN ROTATOR CUFF REPAIR Left 1990's  . SHOULDER OPEN ROTATOR CUFF REPAIR Right 11/2007  . TEE WITHOUT CARDIOVERSION N/A 04/21/2018   Procedure: TRANSESOPHAGEAL ECHOCARDIOGRAM (TEE);  Surgeon: Jerline Pain, MD;  Location: [facility] ENDOSCOPY;  Service: Cardiovascular;  Laterality: N/A;  . TEE WITHOUT CARDIOVERSION N/A 05/18/2018   Procedure: TRANSESOPHAGEAL ECHOCARDIOGRAM (TEE);  Surgeon: Rexene Alberts, MD;  Location: [place];  Service: Open Heart Surgery;  Laterality: N/A;  . TONSILLECTOMY  1950's  . TOTAL HIP ARTHROPLASTY Left 2010  . TOTAL HIP REVISION Left 10/08/2011  . TOTAL HIP REVISION  04/09/2012   Procedure: TOTAL HIP REVISION;  Surgeon:  Gearlean Alf, MD;  Location: [facility];  Service: Orthopedics;  Laterality: Left;  Left Hip Acetabular Revision vs Constrained Liner  . TOTAL KNEE ARTHROPLASTY Right 2006   . TRIGGER FINGER RELEASE Right 08/08/2013   Procedure: RELEASE STENOSING TENOSYNOVITIS RIGHT THUMB;  Surgeon: Wynonia Sours, MD;  Location: [place];  Service: Orthopedics;  Laterality: Right;    Current Medications: Current Meds  Medication Sig  . aspirin EC 81 MG tablet Take 81 mg by mouth daily.  . clobetasol ointment (TEMOVATE) 2.44 % Apply 1 application topically daily as needed (rash).  . furosemide (LASIX) 40 MG tablet TAKE 2 TABLETS BY MOUTH 2 TIMES DAILY (Patient taking differently: Take 80 mg by mouth 2 (two) times daily. )  . Multiple Vitamin (MULTIVITAMIN WITH MINERALS) TABS tablet Take 1 tablet by mouth 2 (two) times a week.  . Naproxen Sod-diphenhydrAMINE (ALEVE PM) 220-25 MG TABS Take 2 tablets by mouth at bedtime as needed (sleep/pain).  . Omega-3 Fatty Acids (FISH OIL PO) Take 1 capsule by mouth 2 (two) times a  week.  . Potassium Chloride ER 20 MEQ TBCR Take 20-40 mEq by mouth See admin instructions. Tues and Thursday, taking 40 meq, and all other days taking 20 meq daily (Patient taking differently: Take 20-40 mEq by mouth See admin instructions. Take 20 meq once daily except Tues and Thurs take 20 meq twice daily)  . XARELTO 20 MG TABS tablet TAKE 1 TABLET BY MOUTH ONCE DAILY (Patient taking differently: Take 20 mg by mouth daily. )     Allergies:   Oxycodone   Social History   Socioeconomic History  . Marital status: Married    Spouse name: Not on file  . Number of children: Not on file  . Years of education: Not on file  . Highest education level: Not on file  Occupational History  . Occupation: Agricultural consultant  Social Needs  . Financial resource strain: Not on file  . Food insecurity    Worry: Not on file    Inability: Not on file  . Transportation  needs    Medical: Not on file    Non-medical: Not on file  Tobacco Use  . Smoking status: Never Smoker  . Smokeless tobacco: Never Used  Substance and Sexual Activity  . Alcohol use: No  . Drug use: No  . Sexual activity: Yes  Lifestyle  . Physical activity    Days per week: Not on file    Minutes per session: Not on file  . Stress: Not on file  Relationships  . Social Herbalist on phone: Not on file    Gets together: Not on file    Attends religious service: Not on file    Active member of club or organization: Not on file    Attends meetings of clubs or organizations: Not on file    Relationship status: Not on file  Other Topics Concern  . Not on file  Social History Narrative   Pt lives in [place] with spouse.  Leadership training and behavioral safety training.     Family History: The patient's family history includes Hypertension in his father; Other in an other family member.  ROS:   Please see the history of present illness.     All other systems reviewed and are negative.  EKGs/Labs/Other Studies Reviewed:    The following studies were reviewed today:  Echo 11/01/18:  1. The left ventricle has mild-moderately reduced systolic function, with an ejection fraction of 40-45%. The cavity size was normal. There is mild concentric left ventricular hypertrophy. Left ventricular diastolic function could not be evaluated due  to mitral valve replacement/repair. Left ventrical global hypokinesis without regional wall motion abnormalities.  2. The right ventricle has mildly reduced systolic function. The cavity was moderately enlarged. There is no increase in right ventricular wall thickness. Right ventricular systolic pressure is moderately elevated with an estimated pressure of 62 mmHg.  3. A 30 mm Sorin Memo 4D Ring valve is present in the mitral position. Procedure Date: 05/18/18.  4. Mild thickening of the mitral valve leaflet. No evidence of true mitral  valve stenosis.  5. A 50mm Biostable HART Ring valve is present in the aortic position. Procedure Date: 05-18-18.  6. The aortic valve is tricuspid. Mild thickening of the aortic valve. Aortic valve regurgitation is mild by color flow Doppler. The jet is eccentric medially directed. Mild stenosis of the aortic valve.  7. The inferior vena cava was dilated in size with <50% respiratory variability.  8.  Left atrial size was severely dilated.  9. Right atrial size was severely dilated. 10. Compared to 05/24/2018 (early postop) report, left ventricular systolic function has decreased, but is similar when compared to all other previous echocardiograms   EKG:  EKG is ordered today.  The ekg ordered today demonstrates appears to be sinus tachycardia with LBBB, HR 112.  Rhythm strip reviewed with Dr. Harrell Gave (DOD) - appears regular QRS, p waves are difficult to track, appears like he has a 1st degree heart block.  Recent Labs: 05/20/2018: Magnesium 2.2 06/17/2018: BNP 233.1 08/17/2018: TSH 2.710 11/16/2018: ALT 24 11/30/2018: BUN 30; Creatinine, Ser 1.10; Hemoglobin 12.1; Platelets 157; Potassium 3.7; Sodium 139  Recent Lipid Panel    Component Value Date/Time   CHOL 126 11/16/2018 0000   TRIG 74 11/16/2018 0000   HDL 37 (L) 11/16/2018 0000   CHOLHDL 3.4 11/16/2018 0000   LDLCALC 74 11/16/2018 0000    Physical Exam:    VS:  BP 135/86   Pulse (!) 110   Temp 97.9 F (36.6 C)   Ht 5\' 10"  (1.778 m)   Wt 258 lb 6.4 oz (117.2 kg)   SpO2 91%   BMI 37.08 kg/m     Wt Readings from Last 3 Encounters:  02/11/19 258 lb 6.4 oz (117.2 kg)  12/13/18 260 lb (117.9 kg)  11/30/18 258 lb (117 kg)     GEN:  Well nourished, well developed in no acute distress HEENT: Normal NECK: No JVD; No carotid bruits LYMPHATICS: No lymphadenopathy CARDIAC: regular rhythm, tachycardic rate, no murmurs, rubs, gallops RESPIRATORY:  Clear to auscultation without rales, wheezing or rhonchi  ABDOMEN: Soft,  non-tender, non-distended MUSCULOSKELETAL:  No edema; No deformity  SKIN: Warm and dry NEUROLOGIC:  Alert and oriented x 3 PSYCHIATRIC:  Normal affect   ASSESSMENT:    1. Atrial fibrillation, persistent   2. Chronic diastolic congestive heart failure ([place])   3. Hypertensive heart disease with congestive heart failure, unspecified heart failure type ([place])   4. Left atrial enlargement    PLAN:    In order of problems listed above:  Atrial fibrillation, persistent S/p MAZE, LAA excision Left atrial enlargement Unsuccessful DCCV 11/2018, but he was hypervolemic at that time. His heart rate has recently increased from the 50s to 100s. He is asymptomatic and actually feels very well.  I obtained a rhythm strip which was reviewed with Dr. Harrell Gave (DOD) - appears regular QRS, p waves are difficult to track, appears like he has a 1st degree heart block. We discussed adding a low dose of lopressor 12.5 mg BID. However, he seems to have a history that may be suggestive of tachy-brady. I would like for him to see EP again to determine best course. I have asked him to return to the office for a nurse visit when/if his HR decreases to below 80 bpm to see if we can better assess his rhythm. Continue xarelto.    Chronic diastolic congestive heart failure ([place]) He is taking  80 mg lasix BID with 20 mEq K and extra 20 mEq K 2 days per week. His last BMP was stable, but unclear what his lasix regimen was at that time. I will collect a repeat BMP when he comes back for his EKG.   Hypertensive heart disease with congestive heart failure, unspecified heart failure type (HCC)  Pressure have been well-controlled.    Lipids 11/16/2018: Cholesterol, Total 126; HDL 37; LDL Calculated 74; Triglycerides 74 Continue fish oil.   Follow  up with EP.   Medication Adjustments/Labs and Tests Ordered: Current medicines are reviewed at length with the patient today.  Concerns regarding medicines are outlined  above.  Orders Placed This Encounter  Procedures  . Basic metabolic panel  . EKG 12-Lead   Meds ordered this encounter  Medications  . metoprolol tartrate (LOPRESSOR) 25 MG tablet    Sig: Take 0.5 tablets (12.5 mg total) by mouth 2 (two) times daily.    Dispense:  30 tablet    Refill:  6    Signed, Ledora Bottcher, [place]  02/11/2019 11:03 AM    [facility]

## 2019-02-11 ENCOUNTER — Other Ambulatory Visit: Payer: Self-pay

## 2019-02-11 ENCOUNTER — Encounter: Payer: Self-pay | Admitting: Physician Assistant

## 2019-02-11 ENCOUNTER — Ambulatory Visit: Payer: Medicare HMO | Admitting: Physician Assistant

## 2019-02-11 VITALS — BP 135/86 | HR 110 | Temp 97.9°F | Ht 70.0 in | Wt 258.4 lb

## 2019-02-11 DIAGNOSIS — I5032 Chronic diastolic (congestive) heart failure: Secondary | ICD-10-CM | POA: Diagnosis not present

## 2019-02-11 DIAGNOSIS — I4819 Other persistent atrial fibrillation: Secondary | ICD-10-CM | POA: Diagnosis not present

## 2019-02-11 DIAGNOSIS — I517 Cardiomegaly: Secondary | ICD-10-CM | POA: Diagnosis not present

## 2019-02-11 DIAGNOSIS — I110 Hypertensive heart disease with heart failure: Secondary | ICD-10-CM | POA: Diagnosis not present

## 2019-02-11 MED ORDER — METOPROLOL TARTRATE 25 MG PO TABS: 12.5000 mg | ORAL_TABLET | Freq: Two times a day (BID) | ORAL | 6 refills | Status: DC

## 2019-02-11 NOTE — Patient Instructions (Signed)
Medication Instructions:  START Metoprolol Tartrate 25 mg> take 1/2 tablet (12.5 mg) twice daily.  If you need a refill on your cardiac medications before your next appointment, please call your pharmacy.    Follow-Up: At [facility], you and your health needs are our priority.  As part of our continuing mission to provide you with exceptional heart care, we have created designated Provider Care Teams.  These Care Teams include your primary Cardiologist (physician) and Advanced Practice Providers (APPs -  Physician Assistants and Nurse Practitioners) who all work together to provide you with the care you need, when you need it. . You have been scheduled for a follow up office visit with Dr. Thompson Grayer on Monday, 02/21/19 at 9:00 AM.  You have been scheduled for a Nurse visit in the [facility] office on Thursday, 02/17/19 at 10:00 AM.   Any Other Special Instructions Will Be Listed Below (If Applicable). None

## 2019-02-14 NOTE — Telephone Encounter (Signed)
Pt had appt 7/31 with Fabian Sharp, PA-C

## 2019-02-16 ENCOUNTER — Other Ambulatory Visit: Payer: Self-pay | Admitting: Internal Medicine

## 2019-02-16 ENCOUNTER — Ambulatory Visit
Admission: RE | Admit: 2019-02-16 | Discharge: 2019-02-16 | Disposition: A | Discharge status: Home / Self Care | Payer: Medicare HMO | Source: Ambulatory Visit | Attending: Internal Medicine | Admitting: Internal Medicine

## 2019-02-16 DIAGNOSIS — Z7901 Long term (current) use of anticoagulants: Secondary | ICD-10-CM

## 2019-02-16 DIAGNOSIS — J90 Pleural effusion, not elsewhere classified: Secondary | ICD-10-CM | POA: Diagnosis not present

## 2019-02-16 DIAGNOSIS — I5022 Chronic systolic (congestive) heart failure: Secondary | ICD-10-CM | POA: Diagnosis not present

## 2019-02-16 DIAGNOSIS — I480 Paroxysmal atrial fibrillation: Secondary | ICD-10-CM | POA: Diagnosis not present

## 2019-02-16 DIAGNOSIS — I10 Essential (primary) hypertension: Secondary | ICD-10-CM | POA: Diagnosis not present

## 2019-02-17 ENCOUNTER — Telehealth: Payer: Self-pay

## 2019-02-17 ENCOUNTER — Ambulatory Visit (INDEPENDENT_AMBULATORY_CARE_PROVIDER_SITE_OTHER): Payer: Medicare HMO

## 2019-02-17 ENCOUNTER — Telehealth: Payer: Self-pay | Admitting: Radiology

## 2019-02-17 ENCOUNTER — Ambulatory Visit: Payer: Medicare HMO | Admitting: Cardiology

## 2019-02-17 ENCOUNTER — Telehealth: Payer: Self-pay | Admitting: Cardiology

## 2019-02-17 ENCOUNTER — Other Ambulatory Visit: Payer: Self-pay

## 2019-02-17 DIAGNOSIS — Z9889 Other specified postprocedural states: Secondary | ICD-10-CM | POA: Diagnosis not present

## 2019-02-17 DIAGNOSIS — Z8679 Personal history of other diseases of the circulatory system: Secondary | ICD-10-CM | POA: Diagnosis not present

## 2019-02-17 DIAGNOSIS — I4819 Other persistent atrial fibrillation: Secondary | ICD-10-CM | POA: Diagnosis not present

## 2019-02-17 DIAGNOSIS — E669 Obesity, unspecified: Secondary | ICD-10-CM

## 2019-02-17 DIAGNOSIS — G4733 Obstructive sleep apnea (adult) (pediatric): Secondary | ICD-10-CM | POA: Diagnosis not present

## 2019-02-17 DIAGNOSIS — I5032 Chronic diastolic (congestive) heart failure: Secondary | ICD-10-CM | POA: Diagnosis not present

## 2019-02-17 NOTE — Telephone Encounter (Signed)
Enrolled patient for a 3 day Zio monitor to be mailed. Brief instructions were gone over with patient and he knows to expect the monitor to arrive in 3-4 days. 48hr holter was changed to 3 day Zio for mailing purposes during covid patient was instructed he only needs to wear 48hrs per his doctor

## 2019-02-17 NOTE — Telephone Encounter (Signed)
Lurena Joiner made aware. Thank you!

## 2019-02-17 NOTE — Assessment & Plan Note (Signed)
BMI 37  

## 2019-02-17 NOTE — Progress Notes (Signed)
1. Reason for visit: EKG to better access rhythm    2. Name of provider requesting visit: Angie Duke, PA    3. Assessment and plan per MD: During visit pt state he feels he had a reaction to the Metoprolol and since has stopped. He report he gained 6 lbs in one day and was SOB. Pt contacted pcp who obtained an CXR and advised pt to increase lasix for 1 day. EKG and CXR reviewed by PA and recommended pt be seen in office. Appointment scheduled today with Kerin Ransom, PA

## 2019-02-17 NOTE — Assessment & Plan Note (Signed)
Compliant with C-pap 

## 2019-02-17 NOTE — Assessment & Plan Note (Signed)
Complex valvuloplasty Nov 2019

## 2019-02-17 NOTE — Progress Notes (Signed)
Cardiology Office Note:    Date:  02/17/2019   ID:  [name], DOB [birth year], MRN [id number]  PCP:  Lance Carol, MD  Cardiologist:  Lance Burow, MD  Electrophysiologist:  Lance Grayer, MD   Referring MD: Lance Carol, MD   Tachycardia  History of Present Illness:    [name] is a 68 y.o. male with a hx of chronic combined systolic and diastolic heart failure, persistent atrial fibrillation, chronic anticoagulation, hypertensive cardiovascular disease, mitral regurgitation, OSA on C-pap, and obesity.  he had normal coronaries in Oct 2019.  He underwent AVR, mitral valve repair, maze procedure, and LAA excision on 05/18/18. Post op he had AF.  Amiodarone was D/C'ed due to bradycardia.  I reviewing his EKGs they all seem to show AF with slow VR.  The patient failed OP DCCV 11/30/2018 though he says he was in CHF then.    Recently he was working on a patio with his son.  The next day e noted his HR was 90-100.  He felt well despite this.  He saw [name] 02/11/2019 his EKG showed regular rhythm at 100 with LBBB.  They were unable to see clear PW.  He was prescribed low dose Metoprolol but when he started taking it he says he felt terrible-"like I was in heart failure again".  He stopped it and now feels better- " I feel fine now".  He doesn't understand whats going on with his heart rate and wanted further explanation.   Past Medical History:  Diagnosis Date  . Aortic insufficiency 04/21/2018  . Aortic insufficiency   . Arthritis    "joints" (09/26/2013)  . Atrial enlargement, left   . Dyspnea    with exertion  . Hearing aid worn    both ears  . Hypertension   . Mitral regurgitation 04/21/2018  . Obesity   . OSA on CPAP    "mask adjusts to what I need" (09/26/2013)  . Persistent atrial fibrillation    cardioversion 06/06/13  . PONV (postoperative nausea and vomiting)   . Rotator cuff tear, right dec 2012   physical therapy done, decreased strength  . S/P aortic  valve repair 05/18/2018   Complex valvuloplasty including plication of right coronary leaflet and 21 mm Biostable HAART annuloplasty ring  . S/P Maze operation for atrial fibrillation 05/18/2018   Complete bilateral atrial lesion set using bipolar radiofrequency and cryothermy ablation with clipping of LA appendage  . S/P mitral valve repair 05/18/2018   Complex valvuloplasty including artificial Gore-tex neochord placement x4 and 30 mm Sorin Memo 4D ring annuloplasty  . Wears glasses     Past Surgical History:  Procedure Laterality Date  . AORTIC VALVE REPAIR N/A 05/18/2018   Procedure: AORTIC VALVE REPAIR using HAART 300 Aortic Annuloplasty Device size 60mm;  Surgeon: Lance Alberts, MD;  Location: [place];  Service: Open Heart Surgery;  Laterality: N/A;  . CARDIOVERSION N/A 06/06/2013   Procedure: CARDIOVERSION;  Surgeon: Lance Klein, MD;  Location: [place] END[facility];  Service: Cardiovascular;  Laterality: N/A;  . CARDIOVERSION N/A 09/28/2013   Procedure: CARDIOVERSION BEDSIDE;  Surgeon: Lance M Martinique, MD;  Location: [place];  Service: Cardiovascular;  Laterality: N/A;  . CARDIOVERSION N/A 11/30/2018   Procedure: CARDIOVERSION;  Surgeon: Lance Spark, MD;  Location: [facility] ENDOSCOPY;  Service: Cardiovascular;  Laterality: N/A;  . CARPAL TUNNEL RELEASE Right 08/08/2013   Procedure: RIGHT CARPAL TUNNEL RELEASE;  Surgeon: Lance Sours, MD;  Location: [place];  Service: Orthopedics;  Laterality: Right;  . CARPAL TUNNEL RELEASE Left 2008  . CLIPPING OF ATRIAL APPENDAGE  05/18/2018   Procedure: CLIPPING OF ATRIAL APPENDAGE using AtriCure Clip size 45;  Surgeon: Lance Alberts, MD;  Location: [place];  Service: Open Heart Surgery;;  . KNEE ARTHROSCOPY Right 1990's  . MAZE N/A 05/18/2018   Procedure: MAZE;  Surgeon: Lance Alberts, MD;  Location: [place];  Service: Open Heart Surgery;  Laterality: N/A;  . MITRAL VALVE REPAIR N/A 05/18/2018   Procedure: MITRAL VALVE REPAIR (MVR) using  4D Memo Ring size 30;  Surgeon: Lance Alberts, MD;  Location: [place];  Service: Open Heart Surgery;  Laterality: N/A;  . RIGHT/LEFT HEART CATH AND CORONARY ANGIOGRAPHY N/A 04/21/2018   Procedure: RIGHT/LEFT HEART CATH AND CORONARY ANGIOGRAPHY;  Surgeon: Lance Bush, MD;  Location: [place] [facility];  Service: Cardiovascular;  Laterality: N/A;  . SHOULDER OPEN ROTATOR CUFF REPAIR Left 1990's  . SHOULDER OPEN ROTATOR CUFF REPAIR Right 11/2007  . TEE WITHOUT CARDIOVERSION N/A 04/21/2018   Procedure: TRANSESOPHAGEAL ECHOCARDIOGRAM (TEE);  Surgeon: Lance Pain, MD;  Location: [facility] ENDOSCOPY;  Service: Cardiovascular;  Laterality: N/A;  . TEE WITHOUT CARDIOVERSION N/A 05/18/2018   Procedure: TRANSESOPHAGEAL ECHOCARDIOGRAM (TEE);  Surgeon: Lance Alberts, MD;  Location: [place];  Service: Open Heart Surgery;  Laterality: N/A;  . TONSILLECTOMY  1950's  . TOTAL HIP ARTHROPLASTY Left 2010  . TOTAL HIP REVISION Left 10/08/2011  . TOTAL HIP REVISION  04/09/2012   Procedure: TOTAL HIP REVISION;  Surgeon: Lance Alf, MD;  Location: [facility];  Service: Orthopedics;  Laterality: Left;  Left Hip Acetabular Revision vs Constrained Liner  . TOTAL KNEE ARTHROPLASTY Right 2006   . TRIGGER FINGER RELEASE Right 08/08/2013   Procedure: RELEASE STENOSING TENOSYNOVITIS RIGHT THUMB;  Surgeon: Lance Sours, MD;  Location: [place];  Service: Orthopedics;  Laterality: Right;    Current Medications: Current Meds  Medication Sig  . aspirin EC 81 MG tablet Take 81 mg by mouth daily.  . clobetasol ointment (TEMOVATE) 4.09 % Apply 1 application topically daily as needed (rash).  . furosemide (LASIX) 40 MG tablet TAKE 2 TABLETS BY MOUTH 2 TIMES DAILY (Patient taking differently: Take 80 mg by mouth 2 (two) times daily. )  . Multiple Vitamin (MULTIVITAMIN WITH MINERALS) TABS tablet Take 1 tablet by mouth 2 (two) times a week.  . Naproxen Sod-diphenhydrAMINE (ALEVE PM) 220-25 MG TABS Take 2 tablets by  mouth at bedtime as needed (sleep/Andrews).  . Omega-3 Fatty Acids (FISH OIL PO) Take 1 capsule by mouth 2 (two) times a week.  . Potassium Chloride ER 20 MEQ TBCR Take 20-40 mEq by mouth See admin instructions. Tues and Thursday, taking 40 meq, and all other days taking 20 meq daily (Patient taking differently: Take 20-40 mEq by mouth See admin instructions. Take 20 meq once daily except Tues and Thurs take 20 meq twice daily)  . XARELTO 20 MG TABS tablet TAKE 1 TABLET BY MOUTH ONCE DAILY (Patient taking differently: Take 20 mg by mouth daily. )     Allergies:   Metoprolol tartrate and Oxycodone   Social History   Socioeconomic History  . Marital status: Married    Spouse name: Not on file  . Number of children: Not on file  . Years of education: Not on file  . Highest education level: Not on file  Occupational History  . Occupation: Agricultural consultant  Social Needs  . Financial resource strain: Not on file  . Food insecurity    Worry: Not on file    Inability: Not on file  . Transportation needs    Medical: Not on file    Non-medical: Not on file  Tobacco Use  . Smoking status: Never Smoker  . Smokeless tobacco: Never Used  Substance and Sexual Activity  . Alcohol use: No  . Drug use: No  . Sexual activity: Yes  Lifestyle  . Physical activity    Days per week: Not on file    Minutes per session: Not on file  . Stress: Not on file  Relationships  . Social Herbalist on phone: Not on file    Gets together: Not on file    Attends religious service: Not on file    Active member of club or organization: Not on file    Attends meetings of clubs or organizations: Not on file    Relationship status: Not on file  Other Topics Concern  . Not on file  Social History Narrative   Pt lives in [place] with spouse.  Leadership training and behavioral safety training.     Family History: The patient's family history includes Hypertension in his  father; Other in an other family member.  ROS:   Please see the history of present illness.     All other systems reviewed and are negative.  EKGs/Labs/Other Studies Reviewed:    The following studies were reviewed today: Echo April 2020  EKG:  EKG is ordered today.  The ekg ordered today demonstrates regular R-R with rate of 108, LBBB, occasionla PVC, no clear P-waves.   Recent Labs: 05/20/2018: Magnesium 2.2 06/17/2018: BNP 233.1 08/17/2018: TSH 2.710 11/16/2018: ALT 24 11/30/2018: BUN 30; Creatinine, Ser 1.10; Hemoglobin 12.1; Platelets 157; Potassium 3.7; Sodium 139  Recent Lipid Panel    Component Value Date/Time   CHOL 126 11/16/2018 0000   TRIG 74 11/16/2018 0000   HDL 37 (L) 11/16/2018 0000   CHOLHDL 3.4 11/16/2018 0000   LDLCALC 74 11/16/2018 0000    Physical Exam:    VS:  BP 132/85   Pulse (!) 108   Ht 5\' 10"  (1.778 m)   Wt 257 lb (116.6 kg)   SpO2 97%   BMI 36.88 kg/m     Wt Readings from Last 3 Encounters:  02/17/19 257 lb (116.6 kg)  02/11/19 258 lb 6.4 oz (117.2 kg)  12/13/18 260 lb (117.9 kg)     GEN: Obese male, in no acute distress HEENT: Normal NECK: No JVD; No carotid bruits LYMPHATICS: No lymphadenopathy CARDIAC: RRR, no murmurs, rubs, gallops RESPIRATORY:  Clear to auscultation without rales, wheezing or rhonchi  ABDOMEN: Soft, non-tender, non-distended MUSCULOSKELETAL:  No edema; No deformity  SKIN: Warm and dry NEUROLOGIC:  Alert and oriented x 3 PSYCHIATRIC:  Normal affect   ASSESSMENT:    Atrial fibrillation, persistent Pt did not tolerate metoprolol  Obstructive sleep apnea Compliant with C-pap  Chronic diastolic congestive heart failure (HCC) EF 40-45% with good prosthetic valve function April 2020  Obesity (BMI 30-39.9) BMI 37  S/P mitral valve repair Complex valvuloplasty Nov 2019  S/P aortic valve repair Complex valvuloplasty including plication of right coronary leaflet and 21 mm Biostable HAART annuloplasty  ring  S/P Maze operation for atrial fibrillation Complete bilateral atrial lesion set using bipolar radiofrequency and cryothermy ablation with clipping of LA appendage  PLAN:    Unusual situation- it seems when  he is in AF his rate is slow and he is somewhat symptomatic.  Today he says he feels fine and feels like he is back in NSR.  It's difficult to tell by his EKG.  I suppose its possible that when he is in AF his rate is slow and when he is in NSR his rate is fast.  Either that or he is in A-flutter with 2:1 conduction and a rate of 110.  He has a f/u with Dr Rayann Heman next week but I think it would be helpful to have more data before that visit and have ordered a 48 Holter.   He was also referred to the CHF clinic by Dr Roxy Manns and dr Gwenlyn Found and I'll look into that as well.    No change in his current rx, obtain monitor, f/u with Dr Rayann Heman.    Medication Adjustments/Labs and Tests Ordered: Current medicines are reviewed at length with the patient today.  Concerns regarding medicines are outlined above.  Orders Placed This Encounter  Procedures  . Holter monitor - 48 hour   No orders of the defined types were placed in this encounter.   Patient Instructions  Medication Instructions:  Your physician recommends that you continue on your current medications as directed. Please refer to the Current Medication list given to you today. If you need a refill on your cardiac medications before your next appointment, please call your pharmacy.   Lab work: None  If you have labs (blood work) drawn today and your tests are completely normal, you will receive your results only by: Marland Kitchen MyChart Message (if you have MyChart) OR . A paper copy in the mail If you have any lab test that is abnormal or we need to change your treatment, we will call you to review the results.  Testing/Procedures: Your physician has recommended that you wear a 48 hour holter monitor. Holter monitors are medical devices  that record the heart's electrical activity. Doctors most often use these monitors to diagnose arrhythmias. Arrhythmias are problems with the speed or rhythm of the heartbeat. The monitor is a small, portable device. You can wear one while you do your normal daily activities. This is usually used to diagnose what is causing palpitations/syncope (passing out). A MONITOR TECH FROM OUR [facility] OFFICE WILL BE IN CONTACT WITH YOU TO GET THIS SET UP.   Follow-Up: At [facility], you and your health needs are our priority.  As part of our continuing mission to provide you with exceptional heart care, we have created designated Provider Care Teams.  These Care Teams include your primary Cardiologist (physician) and Advanced Practice Providers (APPs -  Physician Assistants and Nurse Practitioners) who all work together to provide you with the care you need, when you need it. Marland Kitchen PLEASE MOVE FOLLOW UP APPT WITH DR ALLRED BACK ONE WEEK SO DR ALLRED CAN REVIEW HOLTER MONITOR RESULTS.  Any Other Special Instructions Will Be Listed Below (If Applicable). PLEASE CHECK THE STATUS OF APPT IN THE HEART FAILURE CLINIC, PATIENTS FOLLOW UP APPT WITH DR Rayann Heman NEEDS TO BE BEFORE HEART FAILURE APPT      Signed, Kerin Ransom, PA-C  02/17/2019 12:06 PM    [facility]

## 2019-02-17 NOTE — Assessment & Plan Note (Signed)
Pt did not tolerate metoprolol

## 2019-02-17 NOTE — Assessment & Plan Note (Signed)
Complete bilateral atrial lesion set using bipolar radiofrequency and cryothermy ablation with clipping of LA appendage

## 2019-02-17 NOTE — Assessment & Plan Note (Signed)
Complex valvuloplasty including plication of right coronary leaflet and 21 mm Biostable HAART annuloplasty ring

## 2019-02-17 NOTE — Telephone Encounter (Signed)
I called CHF clinic to follow up on referral from Dr. Rayann Heman.  The scheduler confirmed that Kevan Rosebush, RN knows about the referral and as soon as Dr. Haroldine Laws approves it, she will call the patient to schedule. I explained this to [name].

## 2019-02-17 NOTE — Telephone Encounter (Signed)
Spoke with pt regarding appt on 02/21/19. Pt stated he is waiting for a 48 Holter ordered by Kerin Ransom. Pt also stated Kerin Ransom suggested he cancel his appt until Dr Rayann Heman is able to access the results for the 48 Holter. Pt was informed that one of our schedulers will reach out to reschedule his appt. Pt questions and concerns were address.

## 2019-02-17 NOTE — Patient Instructions (Addendum)
Medication Instructions:  Your physician recommends that you continue on your current medications as directed. Please refer to the Current Medication list given to you today. If you need a refill on your cardiac medications before your next appointment, please call your pharmacy.   Lab work: None  If you have labs (blood work) drawn today and your tests are completely normal, you will receive your results only by: Marland Kitchen MyChart Message (if you have MyChart) OR . A paper copy in the mail If you have any lab test that is abnormal or we need to change your treatment, we will call you to review the results.  Testing/Procedures: Your physician has recommended that you wear a 48 hour holter monitor. Holter monitors are medical devices that record the heart's electrical activity. Doctors most often use these monitors to diagnose arrhythmias. Arrhythmias are problems with the speed or rhythm of the heartbeat. The monitor is a small, portable device. You can wear one while you do your normal daily activities. This is usually used to diagnose what is causing palpitations/syncope (passing out). A MONITOR TECH FROM OUR [facility] [address] OFFICE WILL BE IN CONTACT WITH YOU TO GET THIS SET UP.   Follow-Up: At [facility], you and your health needs are our priority.  As part of our continuing mission to provide you with exceptional heart care, we have created designated Provider Care Teams.  These Care Teams include your primary Cardiologist (physician) and Advanced Practice Providers (APPs -  Physician Assistants and Nurse Practitioners) who all work together to provide you with the care you need, when you need it. Marland Kitchen PLEASE MOVE FOLLOW UP APPT WITH DR ALLRED BACK ONE WEEK SO DR ALLRED CAN REVIEW HOLTER MONITOR RESULTS.  Any Other Special Instructions Will Be Listed Below (If Applicable). PLEASE CHECK THE STATUS OF APPT IN THE HEART FAILURE CLINIC, PATIENTS FOLLOW UP APPT WITH DR ALLRED NEEDS TO BE BEFORE HEART FAILURE  APPT

## 2019-02-17 NOTE — Assessment & Plan Note (Signed)
EF 40-45% with good prosthetic valve function April 2020

## 2019-02-21 ENCOUNTER — Telehealth: Payer: Medicare HMO | Admitting: Internal Medicine

## 2019-02-21 NOTE — Telephone Encounter (Signed)
This encounter was created in error - please disregard.

## 2019-02-22 ENCOUNTER — Other Ambulatory Visit (INDEPENDENT_AMBULATORY_CARE_PROVIDER_SITE_OTHER): Payer: Medicare HMO

## 2019-02-22 DIAGNOSIS — I5032 Chronic diastolic (congestive) heart failure: Secondary | ICD-10-CM

## 2019-02-22 DIAGNOSIS — G4733 Obstructive sleep apnea (adult) (pediatric): Secondary | ICD-10-CM

## 2019-02-22 DIAGNOSIS — I4819 Other persistent atrial fibrillation: Secondary | ICD-10-CM | POA: Diagnosis not present

## 2019-02-28 ENCOUNTER — Telehealth: Payer: Self-pay

## 2019-02-28 NOTE — Telephone Encounter (Signed)
Left a message regarding appt on 03/02/19.

## 2019-03-02 ENCOUNTER — Telehealth (INDEPENDENT_AMBULATORY_CARE_PROVIDER_SITE_OTHER): Payer: Medicare HMO | Admitting: Internal Medicine

## 2019-03-02 ENCOUNTER — Encounter: Payer: Self-pay | Admitting: Internal Medicine

## 2019-03-02 VITALS — BP 129/67 | HR 108 | Ht 70.0 in | Wt 251.0 lb

## 2019-03-02 DIAGNOSIS — Z8679 Personal history of other diseases of the circulatory system: Secondary | ICD-10-CM | POA: Diagnosis not present

## 2019-03-02 DIAGNOSIS — G4733 Obstructive sleep apnea (adult) (pediatric): Secondary | ICD-10-CM

## 2019-03-02 DIAGNOSIS — I517 Cardiomegaly: Secondary | ICD-10-CM | POA: Diagnosis not present

## 2019-03-02 DIAGNOSIS — Z9889 Other specified postprocedural states: Secondary | ICD-10-CM

## 2019-03-02 DIAGNOSIS — I4819 Other persistent atrial fibrillation: Secondary | ICD-10-CM

## 2019-03-02 DIAGNOSIS — I5032 Chronic diastolic (congestive) heart failure: Secondary | ICD-10-CM

## 2019-03-02 DIAGNOSIS — E669 Obesity, unspecified: Secondary | ICD-10-CM | POA: Diagnosis not present

## 2019-03-02 NOTE — Progress Notes (Signed)
Electrophysiology TeleHealth Note   Due to national recommendations of social distancing due to COVID 19, an audio/video telehealth visit is felt to be most appropriate for this patient at this time.  See MyChart message from today for the patient's consent to telehealth for [name].   Date:  03/02/2019   ID:  [name], DOB [birth year], MRN [id number]  Location: patient's home  Provider location:  [facility]  Evaluation Performed: Follow-up visit  PCP:  Seward Carol, MD   Electrophysiologist:  Dr Rayann Heman  Chief Complaint:  palpitations  History of Present Illness:    [name] is a 68 y.o. male who presents via telehealth conferencing today.  Since last being seen in our clinic, the patient reports doing reasonably well.  He has had ongoing issues with atrial arrhythmias.  He has recently been in atrial tachycardia vs atypical atrial flutter with elevated V rates.  He feels reasonably well currently.  Today, he denies symptoms of palpitations, chest pain, shortness of breath,  lower extremity edema, dizziness, presyncope, or syncope.  The patient is otherwise without complaint today.  The patient denies symptoms of fevers, chills, cough, or new SOB worrisome for COVID 19.  Past Medical History:  Diagnosis Date  . Aortic insufficiency 04/21/2018  . Aortic insufficiency   . Arthritis    "joints" (09/26/2013)  . Atrial enlargement, left   . Dyspnea    with exertion  . Hearing aid worn    both ears  . Hypertension   . Mitral regurgitation 04/21/2018  . Obesity   . OSA on CPAP    "mask adjusts to what I need" (09/26/2013)  . Persistent atrial fibrillation    cardioversion 06/06/13  . PONV (postoperative nausea and vomiting)   . Rotator cuff tear, right dec 2012   physical therapy done, decreased strength  . S/P aortic valve repair 05/18/2018   Complex valvuloplasty including plication of right coronary leaflet and 21 mm Biostable HAART annuloplasty ring   . S/P Maze operation for atrial fibrillation 05/18/2018   Complete bilateral atrial lesion set using bipolar radiofrequency and cryothermy ablation with clipping of LA appendage  . S/P mitral valve repair 05/18/2018   Complex valvuloplasty including artificial Gore-tex neochord placement x4 and 30 mm Sorin Memo 4D ring annuloplasty  . Wears glasses     Past Surgical History:  Procedure Laterality Date  . AORTIC VALVE REPAIR N/A 05/18/2018   Procedure: AORTIC VALVE REPAIR using HAART 300 Aortic Annuloplasty Device size 30mm;  Surgeon: Rexene Alberts, MD;  Location: [place];  Service: Open Heart Surgery;  Laterality: N/A;  . CARDIOVERSION N/A 06/06/2013   Procedure: CARDIOVERSION;  Surgeon: Sanda Klein, MD;  Location: [place] [facility];  Service: Cardiovascular;  Laterality: N/A;  . CARDIOVERSION N/A 09/28/2013   Procedure: CARDIOVERSION BEDSIDE;  Surgeon: Peter M Martinique, MD;  Location: [place];  Service: Cardiovascular;  Laterality: N/A;  . CARDIOVERSION N/A 11/30/2018   Procedure: CARDIOVERSION;  Surgeon: Dorothy Spark, MD;  Location: [facility];  Service: Cardiovascular;  Laterality: N/A;  . CARPAL TUNNEL RELEASE Right 08/08/2013   Procedure: RIGHT CARPAL TUNNEL RELEASE;  Surgeon: Wynonia Sours, MD;  Location: [place];  Service: Orthopedics;  Laterality: Right;  . CARPAL TUNNEL RELEASE Left 2008  . CLIPPING OF ATRIAL APPENDAGE  05/18/2018   Procedure: CLIPPING OF ATRIAL APPENDAGE using AtriCure Clip size 45;  Surgeon: Rexene Alberts, MD;  Location: [place];  Service: Open Heart Surgery;;  .  KNEE ARTHROSCOPY Right 1990's  . MAZE N/A 05/18/2018   Procedure: MAZE;  Surgeon: Rexene Alberts, MD;  Location: [place];  Service: Open Heart Surgery;  Laterality: N/A;  . MITRAL VALVE REPAIR N/A 05/18/2018   Procedure: MITRAL VALVE REPAIR (MVR) using 4D Memo Ring size 30;  Surgeon: Rexene Alberts, MD;  Location: [place];  Service: Open Heart Surgery;  Laterality: N/A;  . RIGHT/LEFT HEART  CATH AND CORONARY ANGIOGRAPHY N/A 04/21/2018   Procedure: RIGHT/LEFT HEART CATH AND CORONARY ANGIOGRAPHY;  Surgeon: Nelva Bush, MD;  Location: [place] CV [facility];  Service: Cardiovascular;  Laterality: N/A;  . SHOULDER OPEN ROTATOR CUFF REPAIR Left 1990's  . SHOULDER OPEN ROTATOR CUFF REPAIR Right 11/2007  . TEE WITHOUT CARDIOVERSION N/A 04/21/2018   Procedure: TRANSESOPHAGEAL ECHOCARDIOGRAM (TEE);  Surgeon: Jerline Pain, MD;  [facility];  Service: Cardiovascular;  Laterality: N/A;  . TEE WITHOUT CARDIOVERSION N/A 05/18/2018   Procedure: TRANSESOPHAGEAL ECHOCARDIOGRAM (TEE);  Surgeon: Rexene Alberts, MD;  Location: [place];  Service: Open Heart Surgery;  Laterality: N/A;  . TONSILLECTOMY  1950's  . TOTAL HIP ARTHROPLASTY Left 2010  . TOTAL HIP REVISION Left 10/08/2011  . TOTAL HIP REVISION  04/09/2012   Procedure: TOTAL HIP REVISION;  Surgeon: Gearlean Alf, MD;  Location: [facility];  Service: Orthopedics;  Laterality: Left;  Left Hip Acetabular Revision vs Constrained Liner  . TOTAL KNEE ARTHROPLASTY Right 2006   . TRIGGER FINGER RELEASE Right 08/08/2013   Procedure: RELEASE STENOSING TENOSYNOVITIS RIGHT THUMB;  Surgeon: Wynonia Sours, MD;  Location: [place];  Service: Orthopedics;  Laterality: Right;    Current Outpatient Medications  Medication Sig Dispense Refill  . aspirin EC 81 MG tablet Take 81 mg by mouth daily.    . clobetasol ointment (TEMOVATE) 5.46 % Apply 1 application topically daily as needed (rash).    . furosemide (LASIX) 40 MG tablet TAKE 2 TABLETS BY MOUTH 2 TIMES DAILY (Patient taking differently: Take 80 mg by mouth 2 (two) times daily. ) 120 tablet 6  . Multiple Vitamin (MULTIVITAMIN WITH MINERALS) TABS tablet Take 1 tablet by mouth 2 (two) times a week.    . Naproxen Sod-diphenhydrAMINE (ALEVE PM) 220-25 MG TABS Take 2 tablets by mouth at bedtime as needed (sleep/pain).    . Omega-3 Fatty Acids (FISH OIL PO) Take 1 capsule by mouth 2 (two)  times a week.    . Potassium Chloride ER 20 MEQ TBCR Take 20-40 mEq by mouth See admin instructions. Tues and Thursday, taking 40 meq, and all other days taking 20 meq daily (Patient taking differently: Take 20-40 mEq by mouth See admin instructions. Take 20 meq once daily except Tues and Thurs take 20 meq twice daily) 130 tablet 3  . XARELTO 20 MG TABS tablet TAKE 1 TABLET BY MOUTH ONCE DAILY (Patient taking differently: Take 20 mg by mouth daily. ) 30 tablet 5   No current facility-administered medications for this visit.     Allergies:   Metoprolol tartrate and Oxycodone   Social History:  The patient  reports that he has never smoked. He has never used smokeless tobacco. He reports that he does not drink alcohol or use drugs.   Family History:  The patient's family history includes Hypertension in his father; Other in an other family member.   ROS:  Please see the history of present illness.   All other systems are personally reviewed and negative.    Exam:  Vital Signs:  BP 129/67   Pulse (!) 108   Ht 5\' 10"  (1.778 m)   Wt 251 lb (113.9 kg)   BMI 36.01 kg/m   Well sounding and appearing, alert and conversant, regular work of breathing,  good skin color Eyes- anicteric, neuro- grossly intact, skin- no apparent rash or lesions or cyanosis, mouth- oral mucosa is pink  Labs/Other Tests and Data Reviewed:    Recent Labs: 05/20/2018: Magnesium 2.2 06/17/2018: BNP 233.1 08/17/2018: TSH 2.710 11/16/2018: ALT 24 11/30/2018: BUN 30; Creatinine, Ser 1.10; Hemoglobin 12.1; Platelets 157; Potassium 3.7; Sodium 139   Wt Readings from Last 3 Encounters:  03/02/19 251 lb (113.9 kg)  02/17/19 257 lb (116.6 kg)  02/11/19 258 lb 6.4 oz (117.2 kg)     ASSESSMENT & PLAN:    1.  Atrial tachycardia vs post MAZE atrial flutter V rates are elevated I worry that if left untreated he may develop CHF symptoms He is appropriate anticoagulated.  I would therefore recommend cardioversion at the  next available time.  If he continues to have atrial arrhythmias, we will need to consider AAD therapy vs ablation.  Given severe biatrial enlargement, my enthusiasm for ablation is very low.  He previously had longstanding persistent afib.  Ultimately, we may opt for a rate control strategy.  He was previously rate controlled with amiodarone however this was stopped due to bradycardia.  He is on xarelto.  He is also s/p prior LAA removal.  2. Obesity Lifestyle modification is encouraged  3. OSA Compliance with CPAP is encouraged  4. HTN Stable No change required today  5. Chronic diastolic dysfunction Currently doing well He did have substantial heart failure back in April and may, likely due to poorly controlled AF with RVR  6. S/p MV repair and aortic repair Stable No change required today   We will arrange cardioversion at the next available time.  Follow-up:  AF clinic post cardioversion  Very complicated patient.  A high level of decision making was required for this encounter.  Patient Risk:  after full review of this patients clinical status, I feel that they are at high risk at this time.  Today, I have spent 15 minutes with the patient with telehealth technology discussing arrhythmia management .    Army Fossa, MD  03/02/2019 8:34 AM     [facility] [address] [place] [address] [phone] (office) [phone] (fax)

## 2019-03-02 NOTE — H&P (View-Only) (Signed)
Electrophysiology TeleHealth Note   Due to national recommendations of social distancing due to COVID 19, an audio/video telehealth visit is felt to be most appropriate for this patient at this time.  See MyChart message from today for the patient's consent to telehealth for [facility].   Date:  03/02/2019   ID:  [name], DOB February 05, 1951, MRN [id number]  Location: patient's home  Provider location:  [facility]  Evaluation Performed: Follow-up visit  PCP:  Seward Carol, MD   Electrophysiologist:  Dr Rayann Heman  Chief Complaint:  palpitations  History of Present Illness:    [name] is a 68 y.o. male who presents via telehealth conferencing today.  Since last being seen in our clinic, the patient reports doing reasonably well.  He has had ongoing issues with atrial arrhythmias.  He has recently been in atrial tachycardia vs atypical atrial flutter with elevated V rates.  He feels reasonably well currently.  Today, he denies symptoms of palpitations, chest pain, shortness of breath,  lower extremity edema, dizziness, presyncope, or syncope.  The patient is otherwise without complaint today.  The patient denies symptoms of fevers, chills, cough, or new SOB worrisome for COVID 19.  Past Medical History:  Diagnosis Date  . Aortic insufficiency 04/21/2018  . Aortic insufficiency   . Arthritis    "joints" (09/26/2013)  . Atrial enlargement, left   . Dyspnea    with exertion  . Hearing aid worn    both ears  . Hypertension   . Mitral regurgitation 04/21/2018  . Obesity   . OSA on CPAP    "mask adjusts to what I need" (09/26/2013)  . Persistent atrial fibrillation    cardioversion 06/06/13  . PONV (postoperative nausea and vomiting)   . Rotator cuff tear, right dec 2012   physical therapy done, decreased strength  . S/P aortic valve repair 05/18/2018   Complex valvuloplasty including plication of right coronary leaflet and 21 mm Biostable HAART annuloplasty ring   . S/P Maze operation for atrial fibrillation 05/18/2018   Complete bilateral atrial lesion set using bipolar radiofrequency and cryothermy ablation with clipping of LA appendage  . S/P mitral valve repair 05/18/2018   Complex valvuloplasty including artificial Gore-tex neochord placement x4 and 30 mm Sorin Memo 4D ring annuloplasty  . Wears glasses     Past Surgical History:  Procedure Laterality Date  . AORTIC VALVE REPAIR N/A 05/18/2018   Procedure: AORTIC VALVE REPAIR using HAART 300 Aortic Annuloplasty Device size 58mm;  Surgeon: Rexene Alberts, MD;  Location: [place];  Service: Open Heart Surgery;  Laterality: N/A;  . CARDIOVERSION N/A 06/06/2013   Procedure: CARDIOVERSION;  Surgeon: Sanda Klein, MD;  Location: [place] ENDOSCOPY;  Service: Cardiovascular;  Laterality: N/A;  . CARDIOVERSION N/A 09/28/2013   Procedure: CARDIOVERSION BEDSIDE;  Surgeon: Peter M Martinique, MD;  Location: [place];  Service: Cardiovascular;  Laterality: N/A;  . CARDIOVERSION N/A 11/30/2018   Procedure: CARDIOVERSION;  Surgeon: Dorothy Spark, MD;  Location: [facility] ENDOSCOPY;  Service: Cardiovascular;  Laterality: N/A;  . CARPAL TUNNEL RELEASE Right 08/08/2013   Procedure: RIGHT CARPAL TUNNEL RELEASE;  Surgeon: Wynonia Sours, MD;  Location: [place];  Service: Orthopedics;  Laterality: Right;  . CARPAL TUNNEL RELEASE Left 2008  . CLIPPING OF ATRIAL APPENDAGE  05/18/2018   Procedure: CLIPPING OF ATRIAL APPENDAGE using AtriCure Clip size 45;  Surgeon: Rexene Alberts, MD;  Location: [place];  Service: Open Heart Surgery;;  .  KNEE ARTHROSCOPY Right 1990's  . MAZE N/A 05/18/2018   Procedure: MAZE;  Surgeon: Rexene Alberts, MD;  Location: [place];  Service: Open Heart Surgery;  Laterality: N/A;  . MITRAL VALVE REPAIR N/A 05/18/2018   Procedure: MITRAL VALVE REPAIR (MVR) using 4D Memo Ring size 30;  Surgeon: Rexene Alberts, MD;  Location: [place];  Service: Open Heart Surgery;  Laterality: N/A;  . RIGHT/LEFT HEART  CATH AND CORONARY ANGIOGRAPHY N/A 04/21/2018   Procedure: RIGHT/LEFT HEART CATH AND CORONARY ANGIOGRAPHY;  Surgeon: Nelva Bush, MD;  Location: [place] CV LAB;  Service: Cardiovascular;  Laterality: N/A;  . SHOULDER OPEN ROTATOR CUFF REPAIR Left 1990's  . SHOULDER OPEN ROTATOR CUFF REPAIR Right 11/2007  . TEE WITHOUT CARDIOVERSION N/A 04/21/2018   Procedure: TRANSESOPHAGEAL ECHOCARDIOGRAM (TEE);  Surgeon: Jerline Pain, MD;  Location: [facility] ENDOSCOPY;  Service: Cardiovascular;  Laterality: N/A;  . TEE WITHOUT CARDIOVERSION N/A 05/18/2018   Procedure: TRANSESOPHAGEAL ECHOCARDIOGRAM (TEE);  Surgeon: Rexene Alberts, MD;  Location: [place];  Service: Open Heart Surgery;  Laterality: N/A;  . TONSILLECTOMY  1950's  . TOTAL HIP ARTHROPLASTY Left 2010  . TOTAL HIP REVISION Left 10/08/2011  . TOTAL HIP REVISION  04/09/2012   Procedure: TOTAL HIP REVISION;  Surgeon: Gearlean Alf, MD;  Location: [facility];  Service: Orthopedics;  Laterality: Left;  Left Hip Acetabular Revision vs Constrained Liner  . TOTAL KNEE ARTHROPLASTY Right 2006   . TRIGGER FINGER RELEASE Right 08/08/2013   Procedure: RELEASE STENOSING TENOSYNOVITIS RIGHT THUMB;  Surgeon: Wynonia Sours, MD;  Location: [place];  Service: Orthopedics;  Laterality: Right;    Current Outpatient Medications  Medication Sig Dispense Refill  . aspirin EC 81 MG tablet Take 81 mg by mouth daily.    . clobetasol ointment (TEMOVATE) 7.41 % Apply 1 application topically daily as needed (rash).    . furosemide (LASIX) 40 MG tablet TAKE 2 TABLETS BY MOUTH 2 TIMES DAILY (Patient taking differently: Take 80 mg by mouth 2 (two) times daily. ) 120 tablet 6  . Multiple Vitamin (MULTIVITAMIN WITH MINERALS) TABS tablet Take 1 tablet by mouth 2 (two) times a week.    . Naproxen Sod-diphenhydrAMINE (ALEVE PM) 220-25 MG TABS Take 2 tablets by mouth at bedtime as needed (sleep/pain).    . Omega-3 Fatty Acids (FISH OIL PO) Take 1 capsule by mouth 2 (two)  times a week.    . Potassium Chloride ER 20 MEQ TBCR Take 20-40 mEq by mouth See admin instructions. Tues and Thursday, taking 40 meq, and all other days taking 20 meq daily (Patient taking differently: Take 20-40 mEq by mouth See admin instructions. Take 20 meq once daily except Tues and Thurs take 20 meq twice daily) 130 tablet 3  . XARELTO 20 MG TABS tablet TAKE 1 TABLET BY MOUTH ONCE DAILY (Patient taking differently: Take 20 mg by mouth daily. ) 30 tablet 5   No current facility-administered medications for this visit.     Allergies:   Metoprolol tartrate and Oxycodone   Social History:  The patient  reports that he has never smoked. He has never used smokeless tobacco. He reports that he does not drink alcohol or use drugs.   Family History:  The patient's family history includes Hypertension in his father; Other in an other family member.   ROS:  Please see the history of present illness.   All other systems are personally reviewed and negative.    Exam:  Vital Signs:  BP 129/67   Pulse (!) 108   Ht 5\' 10"  (1.778 m)   Wt 251 lb (113.9 kg)   BMI 36.01 kg/m   Well sounding and appearing, alert and conversant, regular work of breathing,  good skin color Eyes- anicteric, neuro- grossly intact, skin- no apparent rash or lesions or cyanosis, mouth- oral mucosa is pink  Labs/Other Tests and Data Reviewed:    Recent Labs: 05/20/2018: Magnesium 2.2 06/17/2018: BNP 233.1 08/17/2018: TSH 2.710 11/16/2018: ALT 24 11/30/2018: BUN 30; Creatinine, Ser 1.10; Hemoglobin 12.1; Platelets 157; Potassium 3.7; Sodium 139   Wt Readings from Last 3 Encounters:  03/02/19 251 lb (113.9 kg)  02/17/19 257 lb (116.6 kg)  02/11/19 258 lb 6.4 oz (117.2 kg)     ASSESSMENT & PLAN:    1.  Atrial tachycardia vs post MAZE atrial flutter V rates are elevated I worry that if left untreated he may develop CHF symptoms He is appropriate anticoagulated.  I would therefore recommend cardioversion at the  next available time.  If he continues to have atrial arrhythmias, we will need to consider AAD therapy vs ablation.  Given severe biatrial enlargement, my enthusiasm for ablation is very low.  He previously had longstanding persistent afib.  Ultimately, we may opt for a rate control strategy.  He was previously rate controlled with amiodarone however this was stopped due to bradycardia.  He is on xarelto.  He is also s/p prior LAA removal.  2. Obesity Lifestyle modification is encouraged  3. OSA Compliance with CPAP is encouraged  4. HTN Stable No change required today  5. Chronic diastolic dysfunction Currently doing well He did have substantial heart failure back in April and may, likely due to poorly controlled AF with RVR  6. S/p MV repair and aortic repair Stable No change required today   We will arrange cardioversion at the next available time.  Follow-up:  AF clinic post cardioversion  Very complicated patient.  A high level of decision making was required for this encounter.  Patient Risk:  after full review of this patients clinical status, I feel that they are at high risk at this time.  Today, I have spent 15 minutes with the patient with telehealth technology discussing arrhythmia management .    Army Fossa, MD  03/02/2019 8:34 AM     [facility] [address] [place] [address] [phone] (office) [phone] (fax)

## 2019-03-03 ENCOUNTER — Encounter (HOSPITAL_COMMUNITY): Payer: Self-pay | Admitting: *Deleted

## 2019-03-03 ENCOUNTER — Telehealth (HOSPITAL_COMMUNITY): Payer: Self-pay | Admitting: *Deleted

## 2019-03-03 ENCOUNTER — Other Ambulatory Visit (HOSPITAL_COMMUNITY): Payer: Self-pay | Admitting: *Deleted

## 2019-03-03 DIAGNOSIS — I4819 Other persistent atrial fibrillation: Secondary | ICD-10-CM | POA: Diagnosis not present

## 2019-03-03 NOTE — Telephone Encounter (Signed)
Recently drawn labs for upcoming cardioversion from PCP office.  BMET Creatinine 1.14 Sodium 143 Potassium 3.9 BUN 24 Glucose 105  CBC  WBC 8.9 RBC 4.9 HGB 13.3 HCT 39.4 PLT 148  TSH 3.11

## 2019-03-07 ENCOUNTER — Other Ambulatory Visit (HOSPITAL_COMMUNITY)
Admission: RE | Admit: 2019-03-07 | Discharge: 2019-03-07 | Disposition: A | Discharge status: Home / Self Care | Payer: Medicare HMO | Source: Ambulatory Visit | Attending: Cardiology | Admitting: Cardiology

## 2019-03-07 DIAGNOSIS — Z01812 Encounter for preprocedural laboratory examination: Secondary | ICD-10-CM | POA: Diagnosis not present

## 2019-03-07 DIAGNOSIS — Z20828 Contact with and (suspected) exposure to other viral communicable diseases: Secondary | ICD-10-CM | POA: Diagnosis not present

## 2019-03-08 LAB — SARS CORONAVIRUS 2 (TAT 6-24 HRS): SARS Coronavirus 2: NEGATIVE

## 2019-03-09 NOTE — Progress Notes (Signed)
Pre-call placed to patient confirming he has been quarantined since COVID testing and that he has not been sick nor been around anyone sick. All questions answered.

## 2019-03-10 ENCOUNTER — Other Ambulatory Visit: Payer: Self-pay

## 2019-03-10 ENCOUNTER — Ambulatory Visit (HOSPITAL_COMMUNITY): Payer: Medicare HMO | Admitting: Certified Registered"

## 2019-03-10 ENCOUNTER — Encounter (HOSPITAL_COMMUNITY)
Admission: RE | Disposition: A | Discharge status: Home / Self Care | Payer: Self-pay | Source: Home / Self Care | Attending: Cardiology

## 2019-03-10 ENCOUNTER — Encounter (HOSPITAL_COMMUNITY): Payer: Self-pay | Admitting: Cardiology

## 2019-03-10 ENCOUNTER — Ambulatory Visit (HOSPITAL_COMMUNITY)
Admission: RE | Admit: 2019-03-10 | Discharge: 2019-03-10 | Disposition: A | Discharge status: Home / Self Care | Payer: Medicare HMO | Attending: Cardiology | Admitting: Cardiology

## 2019-03-10 DIAGNOSIS — M1990 Unspecified osteoarthritis, unspecified site: Secondary | ICD-10-CM | POA: Insufficient documentation

## 2019-03-10 DIAGNOSIS — Z96642 Presence of left artificial hip joint: Secondary | ICD-10-CM | POA: Diagnosis not present

## 2019-03-10 DIAGNOSIS — Z96651 Presence of right artificial knee joint: Secondary | ICD-10-CM | POA: Insufficient documentation

## 2019-03-10 DIAGNOSIS — I4891 Unspecified atrial fibrillation: Secondary | ICD-10-CM | POA: Diagnosis not present

## 2019-03-10 DIAGNOSIS — Z6836 Body mass index (BMI) 36.0-36.9, adult: Secondary | ICD-10-CM | POA: Diagnosis not present

## 2019-03-10 DIAGNOSIS — G4733 Obstructive sleep apnea (adult) (pediatric): Secondary | ICD-10-CM | POA: Insufficient documentation

## 2019-03-10 DIAGNOSIS — I5032 Chronic diastolic (congestive) heart failure: Secondary | ICD-10-CM | POA: Insufficient documentation

## 2019-03-10 DIAGNOSIS — E669 Obesity, unspecified: Secondary | ICD-10-CM | POA: Diagnosis not present

## 2019-03-10 DIAGNOSIS — Z79899 Other long term (current) drug therapy: Secondary | ICD-10-CM | POA: Insufficient documentation

## 2019-03-10 DIAGNOSIS — I447 Left bundle-branch block, unspecified: Secondary | ICD-10-CM | POA: Diagnosis not present

## 2019-03-10 DIAGNOSIS — I484 Atypical atrial flutter: Secondary | ICD-10-CM | POA: Diagnosis not present

## 2019-03-10 DIAGNOSIS — Z885 Allergy status to narcotic agent status: Secondary | ICD-10-CM | POA: Insufficient documentation

## 2019-03-10 DIAGNOSIS — I4819 Other persistent atrial fibrillation: Secondary | ICD-10-CM | POA: Insufficient documentation

## 2019-03-10 DIAGNOSIS — I4892 Unspecified atrial flutter: Secondary | ICD-10-CM | POA: Diagnosis not present

## 2019-03-10 DIAGNOSIS — Z7982 Long term (current) use of aspirin: Secondary | ICD-10-CM | POA: Insufficient documentation

## 2019-03-10 DIAGNOSIS — I110 Hypertensive heart disease with heart failure: Secondary | ICD-10-CM | POA: Diagnosis not present

## 2019-03-10 DIAGNOSIS — Z7901 Long term (current) use of anticoagulants: Secondary | ICD-10-CM | POA: Insufficient documentation

## 2019-03-10 DIAGNOSIS — I5043 Acute on chronic combined systolic (congestive) and diastolic (congestive) heart failure: Secondary | ICD-10-CM | POA: Diagnosis not present

## 2019-03-10 DIAGNOSIS — Z8249 Family history of ischemic heart disease and other diseases of the circulatory system: Secondary | ICD-10-CM | POA: Diagnosis not present

## 2019-03-10 HISTORY — PX: CARDIOVERSION: SHX1299

## 2019-03-10 SURGERY — CARDIOVERSION
Anesthesia: General

## 2019-03-10 MED ORDER — PROPOFOL 10 MG/ML IV BOLUS
INTRAVENOUS | Status: DC | PRN
  Administered 2019-03-10: 30 mg via INTRAVENOUS
  Administered 2019-03-10: 50 mg via INTRAVENOUS

## 2019-03-10 MED ORDER — LIDOCAINE HCL (CARDIAC) PF 100 MG/5ML IV SOSY
PREFILLED_SYRINGE | INTRAVENOUS | Status: DC | PRN
  Administered 2019-03-10: 60 mg via INTRATRACHEAL

## 2019-03-10 MED ORDER — SODIUM CHLORIDE 0.9 % IV SOLN
INTRAVENOUS | Status: AC | PRN
  Administered 2019-03-10: 1000 mL via INTRAMUSCULAR
  Administered 2019-03-10: 13:00:00 via INTRAVENOUS

## 2019-03-10 NOTE — CV Procedure (Signed)
Procedure:   DCCV  Indication:  Symptomatic atrial flutter  Procedure Note:  The patient signed informed consent.  They have had had therapeutic anticoagulation with Xarelto greater than 3 weeks.  Anesthesia was administered by Dr. Gifford Shave.  Adequate airway was maintained throughout and vital followed per protocol.  They were cardioverted x 1 with 200J of biphasic synchronized energy.  Immediately after cardioversion, he was bradycardic with escape rhythm in the 20s.  His heart rate came up spontaneously, up to 50-60s in what appeared to be competing sinus and junctional rhythms.  He remained hemodynamically stable.  There were no apparent complications.  The patient had normal neuro status and respiratory status post procedure with vitals stable as recorded elsewhere.    Follow up:  They will continue on current medical therapy and follow up with cardiology as scheduled.  Oswaldo Milian, MD 03/10/2019 2:19 PM

## 2019-03-10 NOTE — Transfer of Care (Signed)
Immediate Anesthesia Transfer of Care Note  Patient: [name]  Procedure(s) Performed: CARDIOVERSION (N/A )  Patient Location: Endoscopy Unit  Anesthesia Type:General  Level of Consciousness: awake, alert  and oriented  Airway & Oxygen Therapy: Patient Spontanous Breathing and Patient connected to nasal cannula oxygen  Post-op Assessment: Report given to RN and Post -op Vital signs reviewed and stable  Post vital signs: Reviewed and stable  Last Vitals:  Vitals Value Taken Time  BP 122/83   Temp    Pulse 70   Resp 12   SpO2 97     Last Pain:  Vitals:   03/10/19 1225  TempSrc: Temporal  PainSc: 0-No pain         Complications: No apparent anesthesia complications

## 2019-03-10 NOTE — Anesthesia Procedure Notes (Signed)
Date/Time: 03/10/2019 1:26 PM Performed by: Jearld Pies, CRNA Pre-anesthesia Checklist: Patient being monitored, Suction available, Emergency Drugs available and Patient identified Patient Re-evaluated:Patient Re-evaluated prior to induction Oxygen Delivery Method: Ambu bag Preoxygenation: Pre-oxygenation with 100% oxygen

## 2019-03-10 NOTE — Anesthesia Preprocedure Evaluation (Signed)
Anesthesia Evaluation  Patient identified by MRN, date of birth, ID band Patient awake    Reviewed: Allergy & Precautions, NPO status , Patient's Chart, lab work & pertinent test results  History of Anesthesia Complications (+) PONV and history of anesthetic complications  Airway Mallampati: II  TM Distance: >3 FB Neck ROM: Full    Dental  (+) Teeth Intact, Dental Advisory Given   Pulmonary sleep apnea and Continuous Positive Airway Pressure Ventilation ,    Pulmonary exam normal breath sounds clear to auscultation       Cardiovascular hypertension, +CHF  + dysrhythmias Atrial Fibrillation + Valvular Problems/Murmurs (s/p MVR, AVR) AI  Rhythm:Irregular Rate:Abnormal     Neuro/Psych negative neurological ROS     GI/Hepatic negative GI ROS, Neg liver ROS,   Endo/Other  Obesity   Renal/GU negative Renal ROS     Musculoskeletal  (+) Arthritis ,   Abdominal   Peds  Hematology  (+) Blood dyscrasia (Xarelto), ,   Anesthesia Other Findings Day of surgery medications reviewed with the patient.  Reproductive/Obstetrics                             Anesthesia Physical Anesthesia Plan  ASA: III  Anesthesia Plan: General   Post-op Pain Management:    Induction: Intravenous  PONV Risk Score and Plan: 3 and Treatment may vary due to age or medical condition  Airway Management Planned: Mask  Additional Equipment:   Intra-op Plan:   Post-operative Plan:   Informed Consent: I have reviewed the patients History and Physical, chart, labs and discussed the procedure including the risks, benefits and alternatives for the proposed anesthesia with the patient or authorized representative who has indicated his/her understanding and acceptance.     Dental advisory given  Plan Discussed with: CRNA  Anesthesia Plan Comments:         Anesthesia Quick Evaluation

## 2019-03-10 NOTE — Interval H&P Note (Signed)
History and Physical Interval Note:  03/10/2019 1:02 PM  [name]  has presented today for surgery, with the diagnosis of ATRIAL FIBRILLATION.  The various methods of treatment have been discussed with the patient and family. After consideration of risks, benefits and other options for treatment, the patient has consented to  Procedure(s): CARDIOVERSION (N/A) as a surgical intervention.  The patient's history has been reviewed, patient examined, no change in status, stable for surgery.  I have reviewed the patient's chart and labs.  Questions were answered to the patient's satisfaction.     [name]

## 2019-03-10 NOTE — Discharge Instructions (Signed)

## 2019-03-11 ENCOUNTER — Encounter (HOSPITAL_COMMUNITY): Payer: Self-pay | Admitting: Cardiology

## 2019-03-11 NOTE — Anesthesia Postprocedure Evaluation (Signed)
Anesthesia Post Note  Patient: [name]  Procedure(s) Performed: CARDIOVERSION (N/A )     Patient location during evaluation: Endoscopy Anesthesia Type: General Level of consciousness: awake and alert Pain management: pain level controlled Vital Signs Assessment: post-procedure vital signs reviewed and stable Respiratory status: spontaneous breathing, nonlabored ventilation and respiratory function stable Cardiovascular status: blood pressure returned to baseline and stable Postop Assessment: no apparent nausea or vomiting Anesthetic complications: no    Last Vitals:  Vitals:   03/10/19 1358 03/10/19 1408  BP: 102/66 119/73  Pulse: (!) 57 (!) 59  Resp: 19 13  Temp:    SpO2: 94% 98%    Last Pain:  Vitals:   03/11/19 1325  TempSrc:   PainSc: 0-No pain                 Catalina Gravel

## 2019-03-25 ENCOUNTER — Other Ambulatory Visit: Payer: Self-pay

## 2019-03-25 ENCOUNTER — Encounter (HOSPITAL_COMMUNITY): Payer: Self-pay | Admitting: Cardiology

## 2019-03-25 ENCOUNTER — Ambulatory Visit (HOSPITAL_BASED_OUTPATIENT_CLINIC_OR_DEPARTMENT_OTHER)
Admission: RE | Admit: 2019-03-25 | Discharge: 2019-03-25 | Disposition: A | Discharge status: Home / Self Care | Payer: Medicare HMO | Source: Ambulatory Visit | Attending: Cardiology | Admitting: Cardiology

## 2019-03-25 ENCOUNTER — Telehealth (HOSPITAL_COMMUNITY): Payer: Self-pay

## 2019-03-25 ENCOUNTER — Ambulatory Visit (HOSPITAL_COMMUNITY)
Admission: RE | Admit: 2019-03-25 | Discharge: 2019-03-25 | Disposition: A | Discharge status: Home / Self Care | Payer: Medicare HMO | Source: Ambulatory Visit | Attending: Physician Assistant | Admitting: Physician Assistant

## 2019-03-25 ENCOUNTER — Encounter (HOSPITAL_COMMUNITY): Payer: Self-pay | Admitting: Physician Assistant

## 2019-03-25 VITALS — BP 132/89 | HR 111

## 2019-03-25 VITALS — BP 156/92 | HR 110 | Ht 70.0 in | Wt 258.8 lb

## 2019-03-25 DIAGNOSIS — G4733 Obstructive sleep apnea (adult) (pediatric): Secondary | ICD-10-CM | POA: Diagnosis not present

## 2019-03-25 DIAGNOSIS — I4819 Other persistent atrial fibrillation: Secondary | ICD-10-CM | POA: Insufficient documentation

## 2019-03-25 DIAGNOSIS — Z6837 Body mass index (BMI) 37.0-37.9, adult: Secondary | ICD-10-CM | POA: Diagnosis not present

## 2019-03-25 DIAGNOSIS — I5032 Chronic diastolic (congestive) heart failure: Secondary | ICD-10-CM

## 2019-03-25 DIAGNOSIS — I110 Hypertensive heart disease with heart failure: Secondary | ICD-10-CM | POA: Insufficient documentation

## 2019-03-25 DIAGNOSIS — I484 Atypical atrial flutter: Secondary | ICD-10-CM

## 2019-03-25 DIAGNOSIS — E669 Obesity, unspecified: Secondary | ICD-10-CM | POA: Insufficient documentation

## 2019-03-25 DIAGNOSIS — I454 Nonspecific intraventricular block: Secondary | ICD-10-CM | POA: Diagnosis not present

## 2019-03-25 DIAGNOSIS — Z9889 Other specified postprocedural states: Secondary | ICD-10-CM

## 2019-03-25 LAB — BASIC METABOLIC PANEL
Anion gap: 11 (ref 5–15)
BUN: 29 mg/dL — ABNORMAL HIGH (ref 8–23)
CO2: 26 mmol/L (ref 22–32)
Calcium: 8.7 mg/dL — ABNORMAL LOW (ref 8.9–10.3)
Chloride: 104 mmol/L (ref 98–111)
Creatinine, Ser: 1.13 mg/dL (ref 0.61–1.24)
GFR calc Af Amer: >60 mL/min (ref >60)
GFR calc non Af Amer: >60 mL/min (ref >60)
Glucose, Bld: 86 mg/dL (ref 70–99)
Potassium: 3.1 mmol/L — ABNORMAL LOW (ref 3.5–5.1)
Sodium: 141 mmol/L (ref 135–145)

## 2019-03-25 LAB — BRAIN NATRIURETIC PEPTIDE: B Natriuretic Peptide: 870.7 pg/mL — ABNORMAL HIGH (ref 0.0–100.0)

## 2019-03-25 MED ORDER — TORSEMIDE 20 MG PO TABS: 80.0000 mg | ORAL_TABLET | Freq: Every day | ORAL | 6 refills | Status: DC

## 2019-03-25 MED ORDER — LOSARTAN POTASSIUM 25 MG PO TABS: 25.0000 mg | ORAL_TABLET | Freq: Every day | ORAL | 6 refills | Status: DC

## 2019-03-25 MED ORDER — POTASSIUM CHLORIDE ER 20 MEQ PO TBCR: 20.0000 meq | EXTENDED_RELEASE_TABLET | ORAL | 3 refills | Status: DC

## 2019-03-25 MED ORDER — SPIRONOLACTONE 25 MG PO TABS: 12.5000 mg | ORAL_TABLET | Freq: Every day | ORAL | 3 refills | Status: DC

## 2019-03-25 NOTE — Telephone Encounter (Signed)
Pt is aware of results.  Pt will increase daily potassium by one tab 20 meq. Verbalized understanding.

## 2019-03-25 NOTE — Patient Instructions (Signed)
DISCONTINUE Lasix  START Torsemide 80mg  (4 tabs) daily  START Spironolactone 12.5mg  (1/2 tab) daily  START Losartan 25mg  (1 tab) daily  Labs today and repeat in 1 week We will only contact you if something comes back abnormal or we need to make some changes. Otherwise no news is good news!  CUT BACK FLUID INTAKE TO LESS THAN 2 LITERS A DAY!  Your physician has requested that you have an echocardiogram next week. Echocardiography is a painless test that uses sound waves to create images of your heart. It provides your doctor with information about the size and shape of your heart and how well your heart's chambers and valves are working. This procedure takes approximately one hour. There are no restrictions for this procedure.  Your physician recommends that you schedule a follow-up appointment in: 2 weeks with [name]  At the [facility], you and your health needs are our priority. As part of our continuing mission to provide you with exceptional heart care, we have created designated Provider Care Teams. These Care Teams include your primary Cardiologist (physician) and Advanced Practice Providers (APPs- Physician Assistants and Nurse Practitioners) who all work together to provide you with the care you need, when you need it.   You may see any of the following providers on your designated Care Team at your next follow up: Marland Kitchen Dr Glori Bickers . Dr [name] . Darrick Grinder, NP   Please be sure to bring in all your medications bottles to every appointment.

## 2019-03-25 NOTE — Progress Notes (Signed)
Primary Care Physician: Seward Carol, MD Primary Cardiologist: Dr Gwenlyn Found Primary Electrophysiologist: Dr Rayann Heman Referring Physician: Dr Lucillie Garfinkel is a 68 y.o. male with a history of chronic combined systolic and diastolic heart failure, persistent atrial fibrillation, chronic anticoagulation, hypertensive cardiovascular disease, mitral regurgitation, OSA on C-pap, and obesity. He underwent AVR, mitral valve repair, maze procedure, and LAAexcisionon 05/18/18 with postoperative afib. He was initially on amiodarone but this was stopped 2/2 bradycardia. In 02/2019, he was found to have atrial tachycardia vs atypical atrial flutter with elevated V rates. He underwent DCCV on 03/10/19. Unfortunately, about two days later patient felt his heart rate increase and he has more dyspnea with exertion. He is back in atypical flutter today. He has also noted some increased lower extremity edema.  Today, he denies symptoms of chest pain, shortness of breath, orthopnea, PND, dizziness, presyncope, syncope, snoring, daytime somnolence, bleeding, or neurologic sequela. The patient is tolerating medications without difficulties and is otherwise without complaint today.    Atrial Fibrillation Risk Factors:  he does have symptoms or diagnosis of sleep apnea. he is compliant with CPAP therapy.  he has a BMI of Body mass index is 37.13 kg/m.Marland Kitchen Filed Weights   03/25/19 1040  Weight: 117.4 kg    Family History  Problem Relation Age of Onset  . Other Other        mother had a pacemaker  . Hypertension Father      Atrial Fibrillation Management history:  Previous antiarrhythmic drugs: amiodarone Previous cardioversions: 11/30/18, 03/10/19 Previous ablations: MAZE 2019 CHADS2VASC score: 2 Anticoagulation history: Xarelto    Past Medical History:  Diagnosis Date  . Aortic insufficiency 04/21/2018  . Aortic insufficiency   . Arthritis    "joints" (09/26/2013)  . Atrial enlargement,  left   . Dyspnea    with exertion  . Hearing aid worn    both ears  . Hypertension   . Mitral regurgitation 04/21/2018  . Obesity   . OSA on CPAP    "mask adjusts to what I need" (09/26/2013)  . Persistent atrial fibrillation    cardioversion 06/06/13  . PONV (postoperative nausea and vomiting)   . Rotator cuff tear, right dec 2012   physical therapy done, decreased strength  . S/P aortic valve repair 05/18/2018   Complex valvuloplasty including plication of right coronary leaflet and 21 mm Biostable HAART annuloplasty ring  . S/P Maze operation for atrial fibrillation 05/18/2018   Complete bilateral atrial lesion set using bipolar radiofrequency and cryothermy ablation with clipping of LA appendage  . S/P mitral valve repair 05/18/2018   Complex valvuloplasty including artificial Gore-tex neochord placement x4 and 30 mm Sorin Memo 4D ring annuloplasty  . Wears glasses    Past Surgical History:  Procedure Laterality Date  . AORTIC VALVE REPAIR N/A 05/18/2018   Procedure: AORTIC VALVE REPAIR using HAART 300 Aortic Annuloplasty Device size 19mm;  Surgeon: Rexene Alberts, MD;  Location: [place];  Service: Open Heart Surgery;  Laterality: N/A;  . CARDIOVERSION N/A 06/06/2013   Procedure: CARDIOVERSION;  Surgeon: Sanda Klein, MD;  Location: [facility];  Service: Cardiovascular;  Laterality: N/A;  . CARDIOVERSION N/A 09/28/2013   Procedure: CARDIOVERSION BEDSIDE;  Surgeon: Peter M Martinique, MD;  Location: [place];  Service: Cardiovascular;  Laterality: N/A;  . CARDIOVERSION N/A 11/30/2018   Procedure: CARDIOVERSION;  Surgeon: Dorothy Spark, MD;  Location: [facility] ENDOSCOPY;  Service: Cardiovascular;  Laterality: N/A;  . CARDIOVERSION N/A 03/10/2019  Procedure: CARDIOVERSION;  Surgeon: [name], MD;  Location: [facility] ENDOSCOPY;  Service: Endoscopy;  Laterality: N/A;  . CARPAL TUNNEL RELEASE Right 08/08/2013   Procedure: RIGHT CARPAL TUNNEL RELEASE;  Surgeon: Wynonia Sours, MD;   Location: [place];  Service: Orthopedics;  Laterality: Right;  . CARPAL TUNNEL RELEASE Left 2008  . CLIPPING OF ATRIAL APPENDAGE  05/18/2018   Procedure: CLIPPING OF ATRIAL APPENDAGE using AtriCure Clip size 45;  Surgeon: Rexene Alberts, MD;  Location: [place];  Service: Open Heart Surgery;;  . KNEE ARTHROSCOPY Right 1990's  . MAZE N/A 05/18/2018   Procedure: MAZE;  Surgeon: Rexene Alberts, MD;  Location: [place];  Service: Open Heart Surgery;  Laterality: N/A;  . MITRAL VALVE REPAIR N/A 05/18/2018   Procedure: MITRAL VALVE REPAIR (MVR) using 4D Memo Ring size 30;  Surgeon: Rexene Alberts, MD;  Location: [place];  Service: Open Heart Surgery;  Laterality: N/A;  . RIGHT/LEFT HEART CATH AND CORONARY ANGIOGRAPHY N/A 04/21/2018   Procedure: RIGHT/LEFT HEART CATH AND CORONARY ANGIOGRAPHY;  Surgeon: Nelva Bush, MD;  Location: [place] CV [facility];  Service: Cardiovascular;  Laterality: N/A;  . SHOULDER OPEN ROTATOR CUFF REPAIR Left 1990's  . SHOULDER OPEN ROTATOR CUFF REPAIR Right 11/2007  . TEE WITHOUT CARDIOVERSION N/A 04/21/2018   Procedure: TRANSESOPHAGEAL ECHOCARDIOGRAM (TEE);  Surgeon: Jerline Pain, MD;  Location: [facility] ENDOSCOPY;  Service: Cardiovascular;  Laterality: N/A;  . TEE WITHOUT CARDIOVERSION N/A 05/18/2018   Procedure: TRANSESOPHAGEAL ECHOCARDIOGRAM (TEE);  Surgeon: Rexene Alberts, MD;  Location: [place];  Service: Open Heart Surgery;  Laterality: N/A;  . TONSILLECTOMY  1950's  . TOTAL HIP ARTHROPLASTY Left 2010  . TOTAL HIP REVISION Left 10/08/2011  . TOTAL HIP REVISION  04/09/2012   Procedure: TOTAL HIP REVISION;  Surgeon: Gearlean Alf, MD;  Location: [facility];  Service: Orthopedics;  Laterality: Left;  Left Hip Acetabular Revision vs Constrained Liner  . TOTAL KNEE ARTHROPLASTY Right 2006   . TRIGGER FINGER RELEASE Right 08/08/2013   Procedure: RELEASE STENOSING TENOSYNOVITIS RIGHT THUMB;  Surgeon: Wynonia Sours, MD;  Location: [place];  Service:  Orthopedics;  Laterality: Right;    Current Outpatient Medications  Medication Sig Dispense Refill  . aspirin EC 81 MG tablet Take 81 mg by mouth daily.    . furosemide (LASIX) 40 MG tablet TAKE 2 TABLETS BY MOUTH 2 TIMES DAILY (Patient taking differently: Take 80 mg by mouth 2 (two) times daily. ) 120 tablet 6  . Multiple Vitamin (MULTIVITAMIN WITH MINERALS) TABS tablet Take 1 tablet by mouth 2 (two) times a week.    . Naproxen Sod-diphenhydrAMINE (ALEVE PM) 220-25 MG TABS Take 2 tablets by mouth at bedtime as needed (sleep/pain).    . Omega-3 Fatty Acids (FISH OIL PO) Take 1 capsule by mouth 2 (two) times a week.    . Potassium Chloride ER 20 MEQ TBCR Take 20-40 mEq by mouth See admin instructions. Tues and Thursday, taking 40 meq, and all other days taking 20 meq daily (Patient taking differently: Take 20-40 mEq by mouth See admin instructions. Take 20 meq once daily except Tues and Thurs take 20 meq twice daily) 130 tablet 3  . XARELTO 20 MG TABS tablet TAKE 1 TABLET BY MOUTH ONCE DAILY (Patient taking differently: Take 20 mg by mouth daily. ) 30 tablet 5  . clobetasol ointment (TEMOVATE) [id number] % Apply 1 application topically daily as needed (rash).     No  current facility-administered medications for this encounter.     Allergies  Allergen Reactions  . Metoprolol Tartrate Shortness Of Breath    Shortness of breath, pressure in chest and back  . Oxycodone Rash and Other (See Comments)    Can tolerate Hydrocodone    Social History   Socioeconomic History  . Marital status: Married    Spouse name: Not on file  . Number of children: Not on file  . Years of education: Not on file  . Highest education level: Not on file  Occupational History  . Occupation: Agricultural consultant  Social Needs  . Financial resource strain: Not on file  . Food insecurity    Worry: Not on file    Inability: Not on file  . Transportation needs    Medical: Not on file     Non-medical: Not on file  Tobacco Use  . Smoking status: Never Smoker  . Smokeless tobacco: Never Used  Substance and Sexual Activity  . Alcohol use: No  . Drug use: No  . Sexual activity: Yes  Lifestyle  . Physical activity    Days per week: Not on file    Minutes per session: Not on file  . Stress: Not on file  Relationships  . Social Herbalist on phone: Not on file    Gets together: Not on file    Attends religious service: Not on file    Active member of club or organization: Not on file    Attends meetings of clubs or organizations: Not on file    Relationship status: Not on file  . Intimate partner violence    Fear of current or ex partner: Not on file    Emotionally abused: Not on file    Physically abused: Not on file    Forced sexual activity: Not on file  Other Topics Concern  . Not on file  Social History Narrative   Pt lives in [place] with spouse.  Leadership training and behavioral safety training.     ROS- All systems are reviewed and negative except as per the HPI above.  Physical Exam: Vitals:   03/25/19 1040  BP: (!) 156/92  Pulse: (!) 110  Weight: 117.4 kg  Height: 5\' 10"  (1.778 m)    GEN- The patient is well appearing obese male, alert and oriented x 3 today.   Head- normocephalic, atraumatic Eyes-  Sclera clear, conjunctiva pink Ears- hearing intact Oropharynx- clear Neck- supple  Lungs- Clear to ausculation bilaterally, normal work of breathing Heart- Regular rhythm, tachycardia, no murmurs, rubs or gallops  GI- soft, NT, ND, + BS Extremities- no clubbing, cyanosis. 1+ edema bilaterally. MS- no significant deformity or atrophy Skin- no rash or lesion Psych- euthymic mood, full affect Neuro- strength and sensation are intact  Wt Readings from Last 3 Encounters:  03/25/19 117.4 kg  03/10/19 113 kg  03/02/19 113.9 kg    EKG today demonstrates atypical atrial flutter vs atach HR 110, LBBB, QRS 170, QTc 533  Echo  11/01/18 demonstrated   1. The left ventricle has mild-moderately reduced systolic function, with an ejection fraction of 40-45%. The cavity size was normal. There is mild concentric left ventricular hypertrophy. Left ventricular diastolic function could not be evaluated due  to mitral valve replacement/repair. Left ventrical global hypokinesis without regional wall motion abnormalities.  2. The right ventricle has mildly reduced systolic function. The cavity was moderately enlarged. There is no increase in right ventricular wall  thickness. Right ventricular systolic pressure is moderately elevated with an estimated pressure of 62 mmHg.  3. A 30 mm Sorin Memo 4D Ring valve is present in the mitral position. Procedure Date: 05/18/18.  4. Mild thickening of the mitral valve leaflet. No evidence of true mitral valve stenosis.  5. A 23mm Biostable HART Ring valve is present in the aortic position. Procedure Date: 05-18-18.  6. The aortic valve is tricuspid. Mild thickening of the aortic valve. Aortic valve regurgitation is mild by color flow Doppler. The jet is eccentric medially directed. Mild stenosis of the aortic valve.  7. The inferior vena cava was dilated in size with <50% respiratory variability.  8. Left atrial size was severely dilated.  9. Right atrial size was severely dilated. 10. Compared to 05/24/2018 (early postop) report, left ventricular systolic function has decreased, but is similar when compared to all other previous echocardiograms  Epic records are reviewed at length today  Assessment and Plan:  1. Persistent atrial fibrillation/atrial tachycardia/atrial flutter S/p MAZE 2019. S/p DCCV 03/10/19. Unfortunately had quick return to atrial flutter.  Discussed therapeutic options with the patient. AAD limited by h/o bradycardia and junctional rhythm.  We discussed pursuing an afib ablation. Patient was willing to try if it is possible it will help his symptoms even minimally. I did  discuss that the likelihood of success with ablation was lower given comorbidities and length of time he was in afib. Patient voices understanding. We also discussed rate control. He has been intolerant of rate control medications in the past. AV node ablation was discussed and patient does NOT wish to consider at this time. Patient did not want to make a decision until he spoke to [name] also.  This patients CHA2DS2-VASc Score and unadjusted Ischemic Stroke Rate (% per year) is equal to 2.2 % stroke rate/year from a score of 2  Above score calculated as 1 point each if present [CHF, HTN, DM, Vascular=MI/PAD/Aortic Plaque, Age if 65-74, or Male] Above score calculated as 2 points each if present [Age > 75, or Stroke/TIA/TE]   2. Obesity Body mass index is 37.13 kg/m. Lifestyle modification was discussed at length including regular exercise and weight reduction.   3. Obstructive sleep apnea The importance of adequate treatment of sleep apnea was discussed today in order to improve our ability to maintain sinus rhythm long term. Patient on CPAP therapy.  4. HTN Elevated today. Patient brings in BP log with controlled readings at home. No changes today.  5. Chronic diastolic CHF Patient weight is up. Likely 2/2 rapid rate. He admits to drinking >gallon of water daily. Encouraged him to reduce his intake and avoid sodium. Patient to see [name] later today.  6. Valvular disease S/p MVR and AVR.    Follow up with [name] today. Will likely refer back to Dr Rayann Heman for ablation consideration.    [facility] [address] [place], [place] [address] [phone] 03/25/2019 11:37 AM

## 2019-03-25 NOTE — Telephone Encounter (Signed)
-----   Message from Larey Dresser, MD sent at 03/25/2019  3:54 PM EDT ----- Increase total daily KCl by 20 mEq.

## 2019-03-27 NOTE — Progress Notes (Signed)
PCP: Seward Carol, MD EP: Dr. Rayann Heman Cardiology: Dr. Gwenlyn Found HF Cardiology: Dr. Aundra Dubin  68 y.o. with history of chronic atrial fibrillation and flutter with failed Maze procedure, valvular heart disease s/p mitral and aortic valve repairs, and chronic systolic CHF was referred by Dr. Rayann Heman for CHF evaluation.  Patient has a long history of atrial fibrillation.  He was initially controlled by Tikosyn but it eventually became chronic.  He had significant mitral and aortic regurgitation, and in 11/19, he had mitral and aortic valve repairs with Maze and LA appendage ligation.  However, it appears from ECGs that he never went back into NSR.  He has had slow atrial fibrillation with rate in upper 30s-40s at times, so was never started on amiodarone.  Last echo in 4/20 showed EF 40-45% with stable aortic and mitral valve repairs.  He has developed worsening volume overload, and Lasix has been titrated up to 80 mg bid.  Most recently, he went into atypical atrial flutter with HR consistently in the 110s range.  He therefore was taken for DCCV 03/10/19.  Initially rhythm looked junctional in the 40s-50s, but he subsequently went back into rapid atypical atrial flutter.  Today, he is in atypical flutter with rate 110.    Patient reports worse exertional dyspnea over the last two weeks.  His volume status seems to fluctuate, he was even worse about a month ago with significant orthopnea but improved with increased Lasix, now worsening again.  He is short of breath walking up a hill.  He is short of breath with yardwork.  Generally ok walking on flat ground unless he rushes, dyspnea with walking fast.  No chest pain.  Cath prior to valve surgery in 10/19 showed no significant coronary disease.  Currently, no orthopnea.  He wears CPAP every night.  ECG (personally reviewed): atypical flutter (versus atrial tachycardia) rate 110, LBBB with QRS 170 msec  Labs (8/20): K 3.9, creatinine 1.14, BNP 473, hgb  13.3  PMH: 1. HTN 2. Chronic atrial fibrillation/flutter: Failed Tikosyn.  Had Maze procedure 11/19 but does not appear to have ever gone back into NSR.  Had been in atrial fibrillation with bradycardia with rate in 30s-40s at times, now in atypical atrial flutter primarily with rate 100s-110s.  He failed DCCV 8/20.  No amiodarone due to h/o bradycardia.  3. OSA: Uses CPAP.  4. Chronic systolic CHF: Nonischemic cardiomyopathy.  - 10/19 LHC: No coronary disease.  - Echo (4/20): EF 40-45%, mild LVH, moderate dilated RV with mildly decreased systolic function, s/p MV repair with no significant stenosis or regurgitation, s/p aortic valve repair with mild AI, mild aortic stenosis.  Dilated IVC.  5. Valvular heart disease: Patient has history of MR and AI.  In 11/19, he had mitral valve repair, aortic valve repair, surgical Maze, and LA appendage excision.  6. LBBB  Social History   Socioeconomic History  . Marital status: Married    Spouse name: Not on file  . Number of children: Not on file  . Years of education: Not on file  . Highest education level: Not on file  Occupational History  . Occupation: Agricultural consultant  Social Needs  . Financial resource strain: Not on file  . Food insecurity    Worry: Not on file    Inability: Not on file  . Transportation needs    Medical: Not on file    Non-medical: Not on file  Tobacco Use  . Smoking status: Never Smoker  .  Smokeless tobacco: Never Used  Substance and Sexual Activity  . Alcohol use: No  . Drug use: No  . Sexual activity: Yes  Lifestyle  . Physical activity    Days per week: Not on file    Minutes per session: Not on file  . Stress: Not on file  Relationships  . Social Herbalist on phone: Not on file    Gets together: Not on file    Attends religious service: Not on file    Active member of club or organization: Not on file    Attends meetings of clubs or organizations: Not on file     Relationship status: Not on file  . Intimate partner violence    Fear of current or ex partner: Not on file    Emotionally abused: Not on file    Physically abused: Not on file    Forced sexual activity: Not on file  Other Topics Concern  . Not on file  Social History Narrative   Pt lives in [place] with spouse.  Leadership training and behavioral safety training.   Family History  Problem Relation Age of Onset  . Other Other        mother had a pacemaker  . Hypertension Father    ROS: All systems reviewed and negative except as per HPI.   Current Outpatient Medications  Medication Sig Dispense Refill  . aspirin EC 81 MG tablet Take 81 mg by mouth daily.    . clobetasol ointment (TEMOVATE) AB-123456789 % Apply 1 application topically daily as needed (rash).    . Multiple Vitamin (MULTIVITAMIN WITH MINERALS) TABS tablet Take 1 tablet by mouth 2 (two) times a week.    . Naproxen Sod-diphenhydrAMINE (ALEVE PM) 220-25 MG TABS Take 2 tablets by mouth at bedtime as needed (sleep/pain).    . Omega-3 Fatty Acids (FISH OIL PO) Take 1 capsule by mouth 2 (two) times a week.    [name] 20 MG TABS tablet TAKE 1 TABLET BY MOUTH ONCE DAILY (Patient taking differently: Take 20 mg by mouth daily. ) 30 tablet 5  . losartan (COZAAR) 25 MG tablet Take 1 tablet (25 mg total) by mouth daily. 30 tablet 6  . Potassium Chloride ER 20 MEQ TBCR Take 20-40 mEq by mouth See admin instructions. Tues and Thursday, taking 60 meq, and all other days taking 40 meq daily 180 tablet 3  . spironolactone (ALDACTONE) 25 MG tablet Take 0.5 tablets (12.5 mg total) by mouth daily. 15 tablet 3  . torsemide (DEMADEX) 20 MG tablet Take 4 tablets (80 mg total) by mouth daily. 120 tablet 6   No current facility-administered medications for this encounter.    BP 132/89   Pulse (!) 111   SpO2 96%  General: NAD Neck: JVP 10-12 cm, no thyromegaly or thyroid nodule.  Lungs: Clear to auscultation bilaterally with normal respiratory  effort. CV: Nondisplaced PMI.  Heart tachy, regular S1/S2, no S3/S4, 2/6 early SEM RUSB.  1+ edeme 1/2 up lower legs bilaterally.  No carotid bruit.  Normal pedal pulses.  Abdomen: Soft, nontender, no hepatosplenomegaly, no distention.  Skin: Intact without lesions or rashes.  Neurologic: Alert and oriented x 3.  Psych: Normal affect. Extremities: No clubbing or cyanosis.  HEENT: Normal.   Assessment/Plan: 1. Chronic systolic CHF: Most recent echo in 4/20 with EF 40-45%, moderate RV dilation/mildly decreased RV function.  Nonischemic cardiomyopathy, cath in 10/19 without significant coronary disease.  Symptomatically worse recently, NYHA class III  with significant volume overload on exam.  He also has been in persistent atypical atrial flutter with rate 110, he failed recent DCCV.   - Stop Lasix, start torsemide 80 mg daily.  BMET/BNP today and BMET again in 1 week.  - Start spironolactone 12.5 mg daily.  - Start losartan 25 mg daily (may be able to transition to [facility] eventually).  - Avoid beta blocker for now with significant volume overload and history of slow atrial fibrillation versus junctional bradycardia.   - I will arrange for repeat echo this coming week.  I am concerned that LV function may have worsened with persistent tachycardia recently.  - Drinks excessive fluid, cut back to < 2L/day. Also needs to control sodium intake.  - Need to get him out of rapid atrial flutter (see below) as I think this is going to continue to worsen his CHF over time.  2. Atrial arrhythmias: Long history of chronic atrial fibrillation, has had slow ventricular response in the past (HR 40s at times). He failed Tikosyn and have avoided amiodarone with bradycardia.  He had Maze procedure in 11/19 but it was not successful.  Most recently, he has been in atypical flutter with HR 110s.  He failed DCCV in 8/20.  At this point, I am concerned that rapid flutter is worsening his LV function.  I think we need to  find a way to get him out of it. It is concerning, however, that his baseline rhythm appears to be a slow atrial fibrillation.  - I will discuss attempted flutter/fibrillation ablation with Dr. Rayann Heman, but I suspect chances of success will be low.  I do not think he will be an anti-arrhythmic candidate for reasons outlined above.  - Our only option may be AV nodal ablation with BiV pacing (has baseline wide LBBB).  This is not ideal, but leaving him in rapid atrial flutter is a poor option.  3. OSA: Continue CPAP.  4. Valvular heart disease: S/p MV and AoV repairs in 11/19.  Valves looked stable on last echo in 4/20.  - He will need antibiotic prophylaxis with dental work.   [name] 03/27/2019

## 2019-04-01 ENCOUNTER — Other Ambulatory Visit: Payer: Self-pay

## 2019-04-01 ENCOUNTER — Ambulatory Visit (HOSPITAL_COMMUNITY)
Admission: RE | Admit: 2019-04-01 | Discharge: 2019-04-01 | Disposition: A | Discharge status: Home / Self Care | Payer: Medicare HMO | Source: Ambulatory Visit | Attending: Cardiology | Admitting: Cardiology

## 2019-04-01 DIAGNOSIS — I5032 Chronic diastolic (congestive) heart failure: Secondary | ICD-10-CM | POA: Diagnosis not present

## 2019-04-01 DIAGNOSIS — G4733 Obstructive sleep apnea (adult) (pediatric): Secondary | ICD-10-CM | POA: Diagnosis not present

## 2019-04-01 DIAGNOSIS — I082 Rheumatic disorders of both aortic and tricuspid valves: Secondary | ICD-10-CM | POA: Diagnosis not present

## 2019-04-01 DIAGNOSIS — I110 Hypertensive heart disease with heart failure: Secondary | ICD-10-CM | POA: Insufficient documentation

## 2019-04-01 DIAGNOSIS — Z9889 Other specified postprocedural states: Secondary | ICD-10-CM | POA: Insufficient documentation

## 2019-04-01 LAB — BASIC METABOLIC PANEL
Anion gap: 10 (ref 5–15)
BUN: 35 mg/dL — ABNORMAL HIGH (ref 8–23)
CO2: 27 mmol/L (ref 22–32)
Calcium: 9.2 mg/dL (ref 8.9–10.3)
Chloride: 103 mmol/L (ref 98–111)
Creatinine, Ser: 1.35 mg/dL — ABNORMAL HIGH (ref 0.61–1.24)
GFR calc Af Amer: >60 mL/min (ref >60)
GFR calc non Af Amer: 54 mL/min — ABNORMAL LOW (ref >60)
Glucose, Bld: 182 mg/dL — ABNORMAL HIGH (ref 70–99)
Potassium: 3.3 mmol/L — ABNORMAL LOW (ref 3.5–5.1)
Sodium: 140 mmol/L (ref 135–145)

## 2019-04-01 NOTE — Progress Notes (Signed)
  Echocardiogram 2D Echocardiogram has been performed.  Jennette Dubin 04/01/2019, 9:47 AM

## 2019-04-05 ENCOUNTER — Telehealth (HOSPITAL_COMMUNITY): Payer: Self-pay

## 2019-04-05 MED ORDER — POTASSIUM CHLORIDE ER 20 MEQ PO TBCR: 60.0000 meq | EXTENDED_RELEASE_TABLET | Freq: Every day | ORAL | 3 refills | Status: DC

## 2019-04-05 NOTE — Telephone Encounter (Signed)
-----   Message from Larey Dresser, MD sent at 04/03/2019 10:42 PM EDT ----- Increase total daily KCl by 20 mEq and repeat BMET 1 week.

## 2019-04-05 NOTE — Telephone Encounter (Signed)
-----   Message from Larey Dresser, MD sent at 04/03/2019 10:40 PM EDT ----- EF 20-25%, EF continues to fall.  Need attempt at atrial flutter/fibrillation ablation versus AV nodal ablation/BiV pacing.

## 2019-04-05 NOTE — Telephone Encounter (Signed)
Pt aware of lab results. Pt currently taking 52meq daily. Advised to increase to 38meq daily will most likely repeat at his visit with MD on thursday.  Pt also given results of echo.  He will discuss further at MD visit.  Verbalized understanding.

## 2019-04-07 ENCOUNTER — Ambulatory Visit (HOSPITAL_COMMUNITY)
Admission: RE | Admit: 2019-04-07 | Discharge: 2019-04-07 | Disposition: A | Discharge status: Home / Self Care | Payer: Medicare HMO | Source: Ambulatory Visit | Attending: Cardiology | Admitting: Cardiology

## 2019-04-07 ENCOUNTER — Other Ambulatory Visit: Payer: Self-pay

## 2019-04-07 ENCOUNTER — Encounter (HOSPITAL_COMMUNITY): Payer: Self-pay | Admitting: Cardiology

## 2019-04-07 VITALS — BP 118/68 | HR 85 | Wt 252.5 lb

## 2019-04-07 DIAGNOSIS — I110 Hypertensive heart disease with heart failure: Secondary | ICD-10-CM | POA: Diagnosis not present

## 2019-04-07 DIAGNOSIS — I428 Other cardiomyopathies: Secondary | ICD-10-CM | POA: Insufficient documentation

## 2019-04-07 DIAGNOSIS — G4733 Obstructive sleep apnea (adult) (pediatric): Secondary | ICD-10-CM | POA: Diagnosis not present

## 2019-04-07 DIAGNOSIS — I4892 Unspecified atrial flutter: Secondary | ICD-10-CM | POA: Diagnosis not present

## 2019-04-07 DIAGNOSIS — I482 Chronic atrial fibrillation, unspecified: Secondary | ICD-10-CM | POA: Diagnosis not present

## 2019-04-07 DIAGNOSIS — I4819 Other persistent atrial fibrillation: Secondary | ICD-10-CM | POA: Diagnosis not present

## 2019-04-07 DIAGNOSIS — I447 Left bundle-branch block, unspecified: Secondary | ICD-10-CM | POA: Diagnosis not present

## 2019-04-07 DIAGNOSIS — Z8249 Family history of ischemic heart disease and other diseases of the circulatory system: Secondary | ICD-10-CM | POA: Insufficient documentation

## 2019-04-07 DIAGNOSIS — Z79899 Other long term (current) drug therapy: Secondary | ICD-10-CM | POA: Insufficient documentation

## 2019-04-07 DIAGNOSIS — I080 Rheumatic disorders of both mitral and aortic valves: Secondary | ICD-10-CM | POA: Insufficient documentation

## 2019-04-07 DIAGNOSIS — I5022 Chronic systolic (congestive) heart failure: Secondary | ICD-10-CM | POA: Diagnosis not present

## 2019-04-07 DIAGNOSIS — Z7901 Long term (current) use of anticoagulants: Secondary | ICD-10-CM | POA: Insufficient documentation

## 2019-04-07 DIAGNOSIS — I5032 Chronic diastolic (congestive) heart failure: Secondary | ICD-10-CM | POA: Diagnosis not present

## 2019-04-07 LAB — BASIC METABOLIC PANEL
Anion gap: 9 (ref 5–15)
BUN: 31 mg/dL — ABNORMAL HIGH (ref 8–23)
CO2: 29 mmol/L (ref 22–32)
Calcium: 9.3 mg/dL (ref 8.9–10.3)
Chloride: 104 mmol/L (ref 98–111)
Creatinine, Ser: 1.3 mg/dL — ABNORMAL HIGH (ref 0.61–1.24)
GFR calc Af Amer: >60 mL/min (ref >60)
GFR calc non Af Amer: 57 mL/min — ABNORMAL LOW (ref >60)
Glucose, Bld: 95 mg/dL (ref 70–99)
Potassium: 3.8 mmol/L (ref 3.5–5.1)
Sodium: 142 mmol/L (ref 135–145)

## 2019-04-07 MED ORDER — SPIRONOLACTONE 25 MG PO TABS: 25.0000 mg | ORAL_TABLET | Freq: Every day | ORAL | 3 refills | Status: DC

## 2019-04-07 NOTE — Progress Notes (Signed)
PCP: Seward Carol, MD EP: Dr. Rayann Heman Cardiology: Dr. Gwenlyn Found HF Cardiology: Dr. Aundra Dubin  68 y.o. with history of chronic atrial fibrillation and flutter with failed Maze procedure, valvular heart disease s/p mitral and aortic valve repairs, and chronic systolic CHF was referred by Dr. Rayann Heman for CHF evaluation.  Patient has a long history of atrial fibrillation.  He was initially controlled by Tikosyn but it eventually became chronic.  He had significant mitral and aortic regurgitation, and in 11/19, he had mitral and aortic valve repairs with Maze and LA appendage ligation. Cath prior to valve surgery in 10/19 showed no significant coronary disease.  It appears from ECGs that he never went back into NSR.  He has had slow atrial fibrillation with rate in upper 30s-40s at times, so was never started on amiodarone.  Last echo in 4/20 showed EF 40-45% with stable aortic and mitral valve repairs.  He has developed worsening volume overload, and Lasix has been titrated up to 80 mg bid.  Most recently, he went into atypical atrial flutter with HR consistently in the 110s range.  He therefore was taken for DCCV 03/10/19.  Initially rhythm looked junctional in the 40s-50s, but he subsequently went back into rapid atypical atrial flutter and today is actually atrial fibrillation with rate > 100.  Echo was done in 9/20, showing EF worse at 20-25%.     Patient returns for followup of CHF.  At last appointment, I stopped Lasix and started him on torsemide.  Weight is down 8-9 lbs at home and his breathing is better.  He is short of breath walking up a hill or incline, ok on flat ground.  No orthopnea/PND.  No lightheadedness.  No chest pain. He has cut back on po fluid intake.   ECG (personally reviewed): Atrial fibrillation rate 104, LBBB with QRS 176 msec  Labs (8/20): K 3.9, creatinine 1.14, BNP 473, hgb 13.3 Labs (9/20): K 3.3, creatinine 1.35  PMH: 1. HTN 2. Chronic atrial fibrillation/flutter: Failed  Tikosyn.  Had Maze procedure 11/19 but does not appear to have ever gone back into NSR.  Had been in atrial fibrillation with bradycardia with rate in 30s-40s at times, now in atypical atrial flutter primarily with rate 100s-110s.  He failed DCCV 8/20.  No amiodarone due to h/o bradycardia.  3. OSA: Uses CPAP.  4. Chronic systolic CHF: Nonischemic cardiomyopathy.  - 10/19 LHC: No coronary disease.  - Echo (4/20): EF 40-45%, mild LVH, moderate dilated RV with mildly decreased systolic function, s/p MV repair with no significant stenosis or regurgitation, s/p aortic valve repair with mild AI, mild aortic stenosis.  Dilated IVC.  - Echo (9/20): EF 20-25%, mild LVH with moderate LV dilation, s/p MV repair with mean gradient 6, s/p AoV repair with mean gradient 8. No significant MR or AI.  5. Valvular heart disease: Patient has history of MR and AI.  In 11/19, he had mitral valve repair, aortic valve repair, surgical Maze, and LA appendage excision.  6. LBBB  Social History   Socioeconomic History  . Marital status: Married    Spouse name: Not on file  . Number of children: Not on file  . Years of education: Not on file  . Highest education level: Not on file  Occupational History  . Occupation: Agricultural consultant  Social Needs  . Financial resource strain: Not on file  . Food insecurity    Worry: Not on file    Inability: Not on file  .  Transportation needs    Medical: Not on file    Non-medical: Not on file  Tobacco Use  . Smoking status: Never Smoker  . Smokeless tobacco: Never Used  Substance and Sexual Activity  . Alcohol use: No  . Drug use: No  . Sexual activity: Yes  Lifestyle  . Physical activity    Days per week: Not on file    Minutes per session: Not on file  . Stress: Not on file  Relationships  . Social Herbalist on phone: Not on file    Gets together: Not on file    Attends religious service: Not on file    Active member of club or  organization: Not on file    Attends meetings of clubs or organizations: Not on file    Relationship status: Not on file  . Intimate partner violence    Fear of current or ex partner: Not on file    Emotionally abused: Not on file    Physically abused: Not on file    Forced sexual activity: Not on file  Other Topics Concern  . Not on file  Social History Narrative   Pt lives in [place] with spouse.  Leadership training and behavioral safety training.   Family History  Problem Relation Age of Onset  . Other Other        mother had a pacemaker  . Hypertension Father    ROS: All systems reviewed and negative except as per HPI.   Current Outpatient Medications  Medication Sig Dispense Refill  . clobetasol ointment (TEMOVATE) [id number] % Apply 1 application topically daily as needed (rash).    Marland Kitchen losartan (COZAAR) 25 MG tablet Take 1 tablet (25 mg total) by mouth daily. 30 tablet 6  . Multiple Vitamin (MULTIVITAMIN WITH MINERALS) TABS tablet Take 1 tablet by mouth 2 (two) times a week.    . Naproxen Sod-diphenhydrAMINE (ALEVE PM) 220-25 MG TABS Take 2 tablets by mouth at bedtime as needed (sleep/pain).    . Omega-3 Fatty Acids (FISH OIL PO) Take 1 capsule by mouth 2 (two) times a week.    . Potassium Chloride ER 20 MEQ TBCR Take 60 mEq by mouth daily. 270 tablet 3  . spironolactone (ALDACTONE) 25 MG tablet Take 1 tablet (25 mg total) by mouth daily. 30 tablet 3  . torsemide (DEMADEX) 20 MG tablet Take 4 tablets (80 mg total) by mouth daily. 120 tablet 6  . XARELTO 20 MG TABS tablet TAKE 1 TABLET BY MOUTH ONCE DAILY 30 tablet 5   No current facility-administered medications for this encounter.    BP 118/68   Pulse 85   Wt 114.5 kg (252 lb 8 oz)   SpO2 98%   BMI 36.23 kg/m  General: NAD Neck: JVP 8-9 cm, no thyromegaly or thyroid nodule.  Lungs: Clear to auscultation bilaterally with normal respiratory effort. CV: Nondisplaced PMI.  Heart mildly tachy, irregular S1/S2, no S3/S4, no  murmur.  Trace ankle edema.  No carotid bruit.  Normal pedal pulses.  Abdomen: Soft, nontender, no hepatosplenomegaly, no distention.  Skin: Intact without lesions or rashes.  Neurologic: Alert and oriented x 3.  Psych: Normal affect. Extremities: No clubbing or cyanosis.  HEENT: Normal.   Assessment/Plan: 1. Chronic systolic CHF: Echo in [id number] with EF 40-45%, moderate RV dilation/mildly decreased RV function.  Nonischemic cardiomyopathy, cath in 10/19 without significant coronary disease.  Repeat echo in 9/20 showed fall in EF to 20-25% in the setting of  atrial fibrillation and atrial flutter with persistent tachycardia.  He is doing better on torsemide, NYHA class II-III.  Weight down and volume status looks better, still mildly overloaded.    - Continue torsemide 80 mg daily, check BMET.   - Increase spironolactone to 25 mg daily, BMET 10 days.  - Continue losartan 25 mg daily (may be able to transition to [facility] eventually).  - Avoid beta blocker for now with history of slow atrial fibrillation versus junctional bradycardia.   - Need to get him out of rapid atrial flutter/fibrillation (see below) as I think this is going to continue to worsen his CHF over time.  2. Atrial arrhythmias: Long history of chronic atrial fibrillation, has had slow ventricular response in the past (HR 40s at times). He failed Tikosyn and have avoided amiodarone with bradycardia.  He had Maze procedure in 11/19 but it was not successful.  At initial appointment with me, he was in atypical flutter with HR 110s.  He failed DCCV in 8/20.  Now, he appears to be in rapid atrial fibrillation.  At this point, I am concerned that rapid flutter/fibrillation is worsening his LV function.  I think we need to find a way to get him out of it. It is concerning, however, that he has also been in slow atrial fibrillation in the past as well as possible junctional rhythm. - I have discussed attempted flutter/fibrillation ablation with  Dr. Rayann Heman, but he suspects chance of success is low.  I do not think the patient will be an anti-arrhythmic candidate for reasons outlined above.  - Our only option may be AV nodal ablation with BiV pacing (has baseline wide LBBB).  This is not ideal, but leaving him in rapid atrial fibrillation or flutter is a poor option. Hopefully nodal ablation and pacing will lead to improvement in EF.  I will make him an appointment with Dr. Rayann Heman to discuss the next steps.  - He will continue Xarelto.  He is also on ASA which can be stopped given Xarelto use.  3. OSA: Continue CPAP.  4. Valvular heart disease: S/p MV and AoV repairs in 11/19.  Valves looked stable on last echo in 9/20.  - He will need antibiotic prophylaxis with dental work.   Followup with me in 3 wks.   [name] 04/07/2019

## 2019-04-07 NOTE — Patient Instructions (Addendum)
INCREASE Spironolactone to 25mg  daily.  STOP Aspirin  Routine lab work today. Will notify you of abnormal results  Repeat labs in 10days  Follow up in 3 weeks with Dr.McLean   Dr.McLean requests you have an appointment with Dr.Allred in 1-2 weeks.  (please contact their office at [phone])

## 2019-04-11 ENCOUNTER — Other Ambulatory Visit: Payer: Self-pay

## 2019-04-11 ENCOUNTER — Other Ambulatory Visit: Payer: Self-pay | Admitting: Internal Medicine

## 2019-04-11 ENCOUNTER — Ambulatory Visit: Payer: Medicare HMO | Admitting: Internal Medicine

## 2019-04-11 VITALS — BP 124/68 | HR 89 | Ht 70.0 in | Wt 253.0 lb

## 2019-04-11 DIAGNOSIS — I4891 Unspecified atrial fibrillation: Secondary | ICD-10-CM

## 2019-04-11 DIAGNOSIS — I428 Other cardiomyopathies: Secondary | ICD-10-CM | POA: Diagnosis not present

## 2019-04-11 DIAGNOSIS — I4819 Other persistent atrial fibrillation: Secondary | ICD-10-CM | POA: Diagnosis not present

## 2019-04-11 DIAGNOSIS — Z8679 Personal history of other diseases of the circulatory system: Secondary | ICD-10-CM | POA: Diagnosis not present

## 2019-04-11 DIAGNOSIS — Z9889 Other specified postprocedural states: Secondary | ICD-10-CM | POA: Diagnosis not present

## 2019-04-11 DIAGNOSIS — G4733 Obstructive sleep apnea (adult) (pediatric): Secondary | ICD-10-CM | POA: Diagnosis not present

## 2019-04-11 DIAGNOSIS — I5032 Chronic diastolic (congestive) heart failure: Secondary | ICD-10-CM

## 2019-04-11 DIAGNOSIS — I5022 Chronic systolic (congestive) heart failure: Secondary | ICD-10-CM | POA: Diagnosis not present

## 2019-04-11 LAB — CBC
Hematocrit: 40.3 % (ref 37.5–51.0)
Hemoglobin: 13.5 g/dL (ref 13.0–17.7)
MCH: 27.6 pg (ref 26.6–33.0)
MCHC: 33.5 g/dL (ref 31.5–35.7)
MCV: 82 fL (ref 79–97)
Platelets: 151 10*3/uL (ref 150–450)
RBC: 4.89 x10E6/uL (ref 4.14–5.80)
RDW: 13.4 % (ref 11.6–15.4)
WBC: 9.1 10*3/uL (ref 3.4–10.8)

## 2019-04-11 LAB — BASIC METABOLIC PANEL
BUN/Creatinine Ratio: 24 (ref 10–24)
BUN: 32 mg/dL — ABNORMAL HIGH (ref 8–27)
CO2: 25 mmol/L (ref 20–29)
Calcium: 9.5 mg/dL (ref 8.6–10.2)
Chloride: 100 mmol/L (ref 96–106)
Creatinine, Ser: 1.31 mg/dL — ABNORMAL HIGH (ref 0.76–1.27)
GFR calc Af Amer: 65 mL/min/{1.73_m2} (ref >59)
GFR calc non Af Amer: 56 mL/min/{1.73_m2} — ABNORMAL LOW (ref >59)
Glucose: 110 mg/dL — ABNORMAL HIGH (ref 65–99)
Potassium: 3.7 mmol/L (ref 3.5–5.2)
Sodium: 140 mmol/L (ref 134–144)

## 2019-04-11 MED ORDER — METOPROLOL TARTRATE 50 MG PO TABS: ORAL_TABLET | ORAL | 0 refills | Status: DC

## 2019-04-11 NOTE — Progress Notes (Signed)
PCP: Seward Carol, MD Primary Cardiologist: [name] Primary EP: Dr Rayann Heman  [name] is a 68 y.o. male who presents today for routine electrophysiology followup.  Since last being seen in our clinic, the patient reports doing reasonably well. He continues to struggle with afib/ atypical atrial flutter.   His EF has dropped.  Today, he denies symptoms of palpitations, chest pain, shortness of breath,  lower extremity edema, dizziness, presyncope, or syncope.  The patient is otherwise without complaint today.   Past Medical History:  Diagnosis Date  . Aortic insufficiency 04/21/2018  . Aortic insufficiency   . Arthritis    "joints" (09/26/2013)  . Atrial enlargement, left   . Dyspnea    with exertion  . Hearing aid worn    both ears  . Hypertension   . Mitral regurgitation 04/21/2018  . Obesity   . OSA on CPAP    "mask adjusts to what I need" (09/26/2013)  . Persistent atrial fibrillation    cardioversion 06/06/13  . PONV (postoperative nausea and vomiting)   . Rotator cuff tear, right dec 2012   physical therapy done, decreased strength  . S/P aortic valve repair 05/18/2018   Complex valvuloplasty including plication of right coronary leaflet and 21 mm Biostable HAART annuloplasty ring  . S/P Maze operation for atrial fibrillation 05/18/2018   Complete bilateral atrial lesion set using bipolar radiofrequency and cryothermy ablation with clipping of LA appendage  . S/P mitral valve repair 05/18/2018   Complex valvuloplasty including artificial Gore-tex neochord placement x4 and 30 mm Sorin Memo 4D ring annuloplasty  . Wears glasses    Past Surgical History:  Procedure Laterality Date  . AORTIC VALVE REPAIR N/A 05/18/2018   Procedure: AORTIC VALVE REPAIR using HAART 300 Aortic Annuloplasty Device size 78mm;  Surgeon: Rexene Alberts, MD;  Location: [place];  Service: Open Heart Surgery;  Laterality: N/A;  . CARDIOVERSION N/A 06/06/2013   Procedure: CARDIOVERSION;  Surgeon:  Sanda Klein, MD;  Location: [place] ENDOSCOPY;  Service: Cardiovascular;  Laterality: N/A;  . CARDIOVERSION N/A 09/28/2013   Procedure: CARDIOVERSION BEDSIDE;  Surgeon: Peter M Martinique, MD;  Location: [place];  Service: Cardiovascular;  Laterality: N/A;  . CARDIOVERSION N/A 11/30/2018   Procedure: CARDIOVERSION;  Surgeon: Dorothy Spark, MD;  Location: [facility] ENDOSCOPY;  Service: Cardiovascular;  Laterality: N/A;  . CARDIOVERSION N/A 03/10/2019   Procedure: CARDIOVERSION;  Surgeon: [name], MD;  Location: [place];  Service: Endoscopy;  Laterality: N/A;  . CARPAL TUNNEL RELEASE Right 08/08/2013   Procedure: RIGHT CARPAL TUNNEL RELEASE;  Surgeon: Wynonia Sours, MD;  Location: [place];  Service: Orthopedics;  Laterality: Right;  . CARPAL TUNNEL RELEASE Left 2008  . CLIPPING OF ATRIAL APPENDAGE  05/18/2018   Procedure: CLIPPING OF ATRIAL APPENDAGE using AtriCure Clip size 45;  Surgeon: Rexene Alberts, MD;  Location: [place];  Service: Open Heart Surgery;;  . KNEE ARTHROSCOPY Right 1990's  . MAZE N/A 05/18/2018   Procedure: MAZE;  Surgeon: Rexene Alberts, MD;  Location: [place];  Service: Open Heart Surgery;  Laterality: N/A;  . MITRAL VALVE REPAIR N/A 05/18/2018   Procedure: MITRAL VALVE REPAIR (MVR) using 4D Memo Ring size 30;  Surgeon: Rexene Alberts, MD;  Location: [place];  Service: Open Heart Surgery;  Laterality: N/A;  . RIGHT/LEFT HEART CATH AND CORONARY ANGIOGRAPHY N/A 04/21/2018   Procedure: RIGHT/LEFT HEART CATH AND CORONARY ANGIOGRAPHY;  Surgeon: Nelva Bush, MD;  Location: [place]  CV LAB;  Service: Cardiovascular;  Laterality: N/A;  . SHOULDER OPEN ROTATOR CUFF REPAIR Left 1990's  . SHOULDER OPEN ROTATOR CUFF REPAIR Right 11/2007  . TEE WITHOUT CARDIOVERSION N/A 04/21/2018   Procedure: TRANSESOPHAGEAL ECHOCARDIOGRAM (TEE);  Surgeon: Jerline Pain, MD;  Location: [facility] ENDOSCOPY;  Service: Cardiovascular;  Laterality: N/A;  . TEE WITHOUT CARDIOVERSION N/A  05/18/2018   Procedure: TRANSESOPHAGEAL ECHOCARDIOGRAM (TEE);  Surgeon: Rexene Alberts, MD;  Location: [place];  Service: Open Heart Surgery;  Laterality: N/A;  . TONSILLECTOMY  1950's  . TOTAL HIP ARTHROPLASTY Left 2010  . TOTAL HIP REVISION Left 10/08/2011  . TOTAL HIP REVISION  04/09/2012   Procedure: TOTAL HIP REVISION;  Surgeon: Gearlean Alf, MD;  [facility]: [facility];  Service: Orthopedics;  Laterality: Left;  Left Hip Acetabular Revision vs Constrained Liner  . TOTAL KNEE ARTHROPLASTY Right 2006   . TRIGGER FINGER RELEASE Right 08/08/2013   Procedure: RELEASE STENOSING TENOSYNOVITIS RIGHT THUMB;  Surgeon: Wynonia Sours, MD;  Location: [place];  Service: Orthopedics;  Laterality: Right;    ROS- all systems are reviewed and negatives except as per HPI above  Current Outpatient Medications  Medication Sig Dispense Refill  . clobetasol ointment (TEMOVATE) [id number] % Apply 1 application topically daily as needed (rash).    Marland Kitchen KLOR-CON M20 20 MEQ tablet Take 1 tablet by mouth 2 (two) times daily.    Marland Kitchen losartan (COZAAR) 25 MG tablet Take 1 tablet (25 mg total) by mouth daily. 30 tablet 6  . Multiple Vitamin (MULTIVITAMIN WITH MINERALS) TABS tablet Take 1 tablet by mouth 2 (two) times a week.    . Naproxen Sod-diphenhydrAMINE (ALEVE PM) 220-25 MG TABS Take 2 tablets by mouth at bedtime as needed (sleep/pain).    . Omega-3 Fatty Acids (FISH OIL PO) Take 1 capsule by mouth 2 (two) times a week.    . spironolactone (ALDACTONE) 25 MG tablet Take 12.5 mg by mouth daily.    Marland Kitchen torsemide (DEMADEX) 20 MG tablet Take 4 tablets (80 mg total) by mouth daily. 120 tablet 6  . XARELTO 20 MG TABS tablet TAKE 1 TABLET BY MOUTH ONCE DAILY 30 tablet 5   No current facility-administered medications for this visit.     Physical Exam: Vitals:   04/11/19 0842  BP: 124/68  Pulse: 89  SpO2: 98%  Weight: 253 lb (114.8 kg)  Height: 5\' 10"  (1.778 m)    GEN- The patient is well appearing, alert and  oriented x 3 today.   Head- normocephalic, atraumatic Eyes-  Sclera clear, conjunctiva pink Ears- hearing intact Oropharynx- clear Lungs-   normal work of breathing Heart- Regular rate and rhythm  GI- soft, NT, ND, + BS Extremities- no clubbing, cyanosis, or edema  Wt Readings from Last 3 Encounters:  04/11/19 253 lb (114.8 kg)  04/07/19 252 lb 8 oz (114.5 kg)  03/25/19 258 lb 12.8 oz (117.4 kg)    EKG tracing from CHF clinic reveals afib with wide QRS, V rates 110s  Assessment and Plan:  1. Persistent afib/ atypical atrial flutter post MAZE The patient continues to have atypical atrial flutter and persistent afib post ablation.  He has developed a tachycardia mediated CM. He has severe biatrial enlargement.   I have spoken with Drs Roxy Manns and Aundra Dubin this am about the patient.  We agree that he either requires additional ablation or CRT-D with AV nodal ablation.  The patient has symptomatic, recurrent atrial arrhythmias. He is on xarelto. He  is s/p mitral valve repair with atrial clipping and MAZE.  Therapeutic strategies for afib including afib/ atypical atrial flutter ablation vs AV nodal ablation with CRT-D were discussed in detail with the patient today. Risk, benefits, and alternatives to each approach were also discussed in detail today. He is very clear that he would like to avoid AV nodal ablation and device implantation. He would prefer afib ablation.  He is aware that the risks include but are not limited to stroke, bleeding, vascular damage, tamponade, perforation, damage to the esophagus, lungs, and other structures, pulmonary vein stenosis, worsening renal function, and death. The patient understands these risk and wishes to proceed.  We will therefore proceed with catheter ablation at the next available time.  Carto, ICE, anesthesia are requested for the procedure.  Will also obtain cardiac CT prior to the procedure to further evaluate the previously clipped LAA for thrombus  and further evaluate atrial anatomy.  2. Nonischemic CM with LBBB As above May ultimately benefit from CRT-D with or without AV nodal ablation  3. Obesity Lifestyle modification is encouraged  4. OSA Compliant with CPAP  5. HTN Stable No change required today   Thompson Grayer MD, [facility] 04/11/2019 8:46 AM

## 2019-04-11 NOTE — Patient Instructions (Addendum)
Medication Instructions:  Your physician recommends that you continue on your current medications as directed. Please refer to the Current Medication list given to you today.  Labwork: You will have labs drawn today: CBC and BMP   Testing/Procedures:  Your physician has requested that you have cardiac CT. Cardiac computed tomography (CT) is a painless test that uses an x-ray machine to take clear, detailed pictures of your heart. For further information please visit [email or web address]. Please follow instruction sheet as given.   Your physician has recommended that you have an ablation. Catheter ablation is a medical procedure used to treat some cardiac arrhythmias (irregular heartbeats). During catheter ablation, a long, thin, flexible tube is put into a blood vessel in your groin (upper thigh), or neck. This tube is called an ablation catheter. It is then guided to your heart through the blood vessel. Radio frequency waves destroy small areas of heart tissue where abnormal heartbeats may cause an arrhythmia to start. Please see the instruction sheet given to you today.   Follow-Up: Your physician recommends that you schedule a follow-up appointment on  October 11 @ 1100AM at our [facility] for your COVID screening  November 10 @ 9:30AM at our Afib clinic with Adline Peals, PA for a 4 week post ablation follow up.   January 20 @ 9:45AM as a virtual visit with Dr. Rayann Heman for your 3 mo post ablation follow up.    Any Other Special Instructions Will Be Listed Below (If Applicable).     Your cardiac CT will be scheduled at one of the below locations:   [facility] [address] [place], [place] [address] (336) [place] [address] [place], [place] [address] [phone]  If scheduled at [facility], please arrive at the [facility] main entrance of [facility] 30-45 minutes  prior to test start time. Proceed to the [facility] Radiology Department (first floor) to check-in and test prep.  If scheduled at [facility], please arrive 15 mins early for check-in and test prep.  Please follow these instructions carefully (unless otherwise directed):  Hold all erectile dysfunction medications at least 3 days (72 hrs) prior to test.  On the Night Before the Test: . Be sure to Drink plenty of water. . Do not consume any caffeinated/decaffeinated beverages or chocolate 12 hours prior to your test. . Do not take any antihistamines 12 hours prior to your test.   On the Day of the Test: . Drink plenty of water. Do not drink any water within one hour of the test. . Do not eat any food 4 hours prior to the test. . You may take your regular medications prior to the test.  . Take metoprolol (Lopressor) two hours prior to test. . HOLD Torsemide and spironolactone morning of the test.        After the Test: . Drink plenty of water. . After receiving IV contrast, you may experience a mild flushed feeling. This is normal. . On occasion, you may experience a mild rash up to 24 hours after the test. This is not dangerous. If this occurs, you can take Benadryl 25 mg and increase your fluid intake. . If you experience trouble breathing, this can be serious. If it is severe call 911 IMMEDIATELY. If it is mild, please call our office.   Please contact the cardiac imaging nurse navigator should you have any questions/concerns Marchia Bond,  RN Navigator Cardiac Imaging [facility] [phone] Office  [phone] Cell     If you need a refill on your cardiac medications before your next appointment, please call your pharmacy.

## 2019-04-11 NOTE — H&P (View-Only) (Signed)
PCP: Seward Carol, MD Primary Cardiologist: [name] Primary EP: Dr Rayann Heman  [name] is a 68 y.o. male who presents today for routine electrophysiology followup.  Since last being seen in our clinic, the patient reports doing reasonably well. He continues to struggle with afib/ atypical atrial flutter.   His EF has dropped.  Today, he denies symptoms of palpitations, chest pain, shortness of breath,  lower extremity edema, dizziness, presyncope, or syncope.  The patient is otherwise without complaint today.   Past Medical History:  Diagnosis Date  . Aortic insufficiency 04/21/2018  . Aortic insufficiency   . Arthritis    "joints" (09/26/2013)  . Atrial enlargement, left   . Dyspnea    with exertion  . Hearing aid worn    both ears  . Hypertension   . Mitral regurgitation 04/21/2018  . Obesity   . OSA on CPAP    "mask adjusts to what I need" (09/26/2013)  . Persistent atrial fibrillation    cardioversion 06/06/13  . PONV (postoperative nausea and vomiting)   . Rotator cuff tear, right dec 2012   physical therapy done, decreased strength  . S/P aortic valve repair 05/18/2018   Complex valvuloplasty including plication of right coronary leaflet and 21 mm Biostable HAART annuloplasty ring  . S/P Maze operation for atrial fibrillation 05/18/2018   Complete bilateral atrial lesion set using bipolar radiofrequency and cryothermy ablation with clipping of LA appendage  . S/P mitral valve repair 05/18/2018   Complex valvuloplasty including artificial Gore-tex neochord placement x4 and 30 mm Sorin Memo 4D ring annuloplasty  . Wears glasses    Past Surgical History:  Procedure Laterality Date  . AORTIC VALVE REPAIR N/A 05/18/2018   Procedure: AORTIC VALVE REPAIR using HAART 300 Aortic Annuloplasty Device size 7mm;  Surgeon: Rexene Alberts, MD;  Location: [place];  Service: Open Heart Surgery;  Laterality: N/A;  . CARDIOVERSION N/A 06/06/2013   Procedure: CARDIOVERSION;  Surgeon:  Sanda Klein, MD;  Location: [place] ENDOSCOPY;  Service: Cardiovascular;  Laterality: N/A;  . CARDIOVERSION N/A 09/28/2013   Procedure: CARDIOVERSION BEDSIDE;  Surgeon: Peter M Martinique, MD;  Location: [place];  Service: Cardiovascular;  Laterality: N/A;  . CARDIOVERSION N/A 11/30/2018   Procedure: CARDIOVERSION;  Surgeon: Dorothy Spark, MD;  Location: [facility] ENDOSCOPY;  Service: Cardiovascular;  Laterality: N/A;  . CARDIOVERSION N/A 03/10/2019   Procedure: CARDIOVERSION;  Surgeon: [name], MD;  Location: [place];  Service: Endoscopy;  Laterality: N/A;  . CARPAL TUNNEL RELEASE Right 08/08/2013   Procedure: RIGHT CARPAL TUNNEL RELEASE;  Surgeon: Wynonia Sours, MD;  Location: [place];  Service: Orthopedics;  Laterality: Right;  . CARPAL TUNNEL RELEASE Left 2008  . CLIPPING OF ATRIAL APPENDAGE  05/18/2018   Procedure: CLIPPING OF ATRIAL APPENDAGE using AtriCure Clip size 45;  Surgeon: Rexene Alberts, MD;  Location: [place];  Service: Open Heart Surgery;;  . KNEE ARTHROSCOPY Right 1990's  . MAZE N/A 05/18/2018   Procedure: MAZE;  Surgeon: Rexene Alberts, MD;  Location: [place];  Service: Open Heart Surgery;  Laterality: N/A;  . MITRAL VALVE REPAIR N/A 05/18/2018   Procedure: MITRAL VALVE REPAIR (MVR) using 4D Memo Ring size 30;  Surgeon: Rexene Alberts, MD;  Location: [place];  Service: Open Heart Surgery;  Laterality: N/A;  . RIGHT/LEFT HEART CATH AND CORONARY ANGIOGRAPHY N/A 04/21/2018   Procedure: RIGHT/LEFT HEART CATH AND CORONARY ANGIOGRAPHY;  Surgeon: Nelva Bush, MD;  Location: [place]  CV LAB;  Service: Cardiovascular;  Laterality: N/A;  . SHOULDER OPEN ROTATOR CUFF REPAIR Left 1990's  . SHOULDER OPEN ROTATOR CUFF REPAIR Right 11/2007  . TEE WITHOUT CARDIOVERSION N/A 04/21/2018   Procedure: TRANSESOPHAGEAL ECHOCARDIOGRAM (TEE);  Surgeon: Jerline Pain, MD;  Location: [facility] ENDOSCOPY;  Service: Cardiovascular;  Laterality: N/A;  . TEE WITHOUT CARDIOVERSION N/A  05/18/2018   Procedure: TRANSESOPHAGEAL ECHOCARDIOGRAM (TEE);  Surgeon: Rexene Alberts, MD;  Location: [place];  Service: Open Heart Surgery;  Laterality: N/A;  . TONSILLECTOMY  1950's  . TOTAL HIP ARTHROPLASTY Left 2010  . TOTAL HIP REVISION Left 10/08/2011  . TOTAL HIP REVISION  04/09/2012   Procedure: TOTAL HIP REVISION;  Surgeon: Gearlean Alf, MD;  Location: [facility];  Service: Orthopedics;  Laterality: Left;  Left Hip Acetabular Revision vs Constrained Liner  . TOTAL KNEE ARTHROPLASTY Right 2006   . TRIGGER FINGER RELEASE Right 08/08/2013   Procedure: RELEASE STENOSING TENOSYNOVITIS RIGHT THUMB;  Surgeon: Wynonia Sours, MD;  Location: [place];  Service: Orthopedics;  Laterality: Right;    ROS- all systems are reviewed and negatives except as per HPI above  Current Outpatient Medications  Medication Sig Dispense Refill  . clobetasol ointment (TEMOVATE) [id number] % Apply 1 application topically daily as needed (rash).    Marland Kitchen KLOR-CON M20 20 MEQ tablet Take 1 tablet by mouth 2 (two) times daily.    Marland Kitchen losartan (COZAAR) 25 MG tablet Take 1 tablet (25 mg total) by mouth daily. 30 tablet 6  . Multiple Vitamin (MULTIVITAMIN WITH MINERALS) TABS tablet Take 1 tablet by mouth 2 (two) times a week.    . Naproxen Sod-diphenhydrAMINE (ALEVE PM) 220-25 MG TABS Take 2 tablets by mouth at bedtime as needed (sleep/pain).    . Omega-3 Fatty Acids (FISH OIL PO) Take 1 capsule by mouth 2 (two) times a week.    . spironolactone (ALDACTONE) 25 MG tablet Take 12.5 mg by mouth daily.    Marland Kitchen torsemide (DEMADEX) 20 MG tablet Take 4 tablets (80 mg total) by mouth daily. 120 tablet 6  . XARELTO 20 MG TABS tablet TAKE 1 TABLET BY MOUTH ONCE DAILY 30 tablet 5   No current facility-administered medications for this visit.     Physical Exam: Vitals:   04/11/19 0842  BP: 124/68  Pulse: 89  SpO2: 98%  Weight: 253 lb (114.8 kg)  Height: 5\' 10"  (1.778 m)    GEN- The patient is well appearing, alert and  oriented x 3 today.   Head- normocephalic, atraumatic Eyes-  Sclera clear, conjunctiva pink Ears- hearing intact Oropharynx- clear Lungs-   normal work of breathing Heart- Regular rate and rhythm  GI- soft, NT, ND, + BS Extremities- no clubbing, cyanosis, or edema  Wt Readings from Last 3 Encounters:  04/11/19 253 lb (114.8 kg)  04/07/19 252 lb 8 oz (114.5 kg)  03/25/19 258 lb 12.8 oz (117.4 kg)    EKG tracing from CHF clinic reveals afib with wide QRS, V rates 110s  Assessment and Plan:  1. Persistent afib/ atypical atrial flutter post MAZE The patient continues to have atypical atrial flutter and persistent afib post ablation.  He has developed a tachycardia mediated CM. He has severe biatrial enlargement.   I have spoken with Drs Roxy Manns and Aundra Dubin this am about the patient.  We agree that he either requires additional ablation or CRT-D with AV nodal ablation.  The patient has symptomatic, recurrent atrial arrhythmias. He is on xarelto. He  is s/p mitral valve repair with atrial clipping and MAZE.  Therapeutic strategies for afib including afib/ atypical atrial flutter ablation vs AV nodal ablation with CRT-D were discussed in detail with the patient today. Risk, benefits, and alternatives to each approach were also discussed in detail today. He is very clear that he would like to avoid AV nodal ablation and device implantation. He would prefer afib ablation.  He is aware that the risks include but are not limited to stroke, bleeding, vascular damage, tamponade, perforation, damage to the esophagus, lungs, and other structures, pulmonary vein stenosis, worsening renal function, and death. The patient understands these risk and wishes to proceed.  We will therefore proceed with catheter ablation at the next available time.  Carto, ICE, anesthesia are requested for the procedure.  Will also obtain cardiac CT prior to the procedure to further evaluate the previously clipped LAA for thrombus  and further evaluate atrial anatomy.  2. Nonischemic CM with LBBB As above May ultimately benefit from CRT-D with or without AV nodal ablation  3. Obesity Lifestyle modification is encouraged  4. OSA Compliant with CPAP  5. HTN Stable No change required today   Thompson Grayer MD, [facility] 04/11/2019 8:46 AM

## 2019-04-18 ENCOUNTER — Other Ambulatory Visit (HOSPITAL_COMMUNITY): Payer: Medicare HMO

## 2019-04-18 ENCOUNTER — Telehealth (HOSPITAL_COMMUNITY): Payer: Self-pay | Admitting: *Deleted

## 2019-04-18 MED ORDER — TORSEMIDE 20 MG PO TABS: 80.0000 mg | ORAL_TABLET | Freq: Every day | ORAL | 6 refills | Status: DC

## 2019-04-18 MED ORDER — RIVAROXABAN 20 MG PO TABS: 20.0000 mg | ORAL_TABLET | Freq: Every day | ORAL | 5 refills | Status: DC

## 2019-04-18 MED ORDER — SPIRONOLACTONE 25 MG PO TABS: 12.5000 mg | ORAL_TABLET | Freq: Every day | ORAL | 11 refills | Status: DC

## 2019-04-18 MED ORDER — KLOR-CON M20 20 MEQ PO TBCR: 20.0000 meq | EXTENDED_RELEASE_TABLET | Freq: Two times a day (BID) | ORAL | 11 refills | Status: DC

## 2019-04-18 MED ORDER — LOSARTAN POTASSIUM 25 MG PO TABS: 25.0000 mg | ORAL_TABLET | Freq: Every day | ORAL | 6 refills | Status: DC

## 2019-04-18 NOTE — Telephone Encounter (Signed)
Spoke with Mr. Tainter about his issues affording many of his medications, including torsemide. [facility] is able to fill torsemide at no cost to the patient through the Heart Failure fund. I will send scripts for torsemide, losartan, potassium chloride, spironolactone and Xarelto to [facility] and he will be able to pick it up tomorrow. Can consider applying for Manufacturer's Assistance for Xarelto.  Audry Riles, PharmD, BCPS, CPP Heart Failure Clinic Pharmacist [phone]

## 2019-04-18 NOTE — Telephone Encounter (Signed)
I would recommend that he say on torsemide because outcomes are better on torsemide and he lost a lot of weight on torsemide.  He was on a high dose of Lasix before and was still volume overloaded.  If we go back to Lasix, volume will probably build up again.  I would tell him that it is probably better to spend the $40 and maybe we can see if our pharmacist can find a better place for him to get the torsemide that is less expensive (will cc [facility]).  Would check BMET and Mg to assess electrolytes as this is likely why he is getting leg cramps.  If he is on enough Lasix to keep the fluid off him, risk of leg cramps is going to be there as well.   Overall, CHF is better on torsemide and if he goes back to Lasix he will likely pick up fluid again.  If he insists, I will let him.

## 2019-04-18 NOTE — Telephone Encounter (Signed)
Left detailed msg also requested patient return my call.

## 2019-04-18 NOTE — Telephone Encounter (Signed)
Pt called wanting to stop torsemide and go back on lasix . Pt said lasix was free and torsemide is over $40 a month. Pt also said torsemide gives him leg cramps. Pt states his weight is stable and no edema at this time. Pt denies any chf symptoms.    Routed to [place] for advice

## 2019-04-19 ENCOUNTER — Telehealth (HOSPITAL_COMMUNITY): Payer: Self-pay | Admitting: Emergency Medicine

## 2019-04-19 ENCOUNTER — Telehealth (HOSPITAL_COMMUNITY): Payer: Self-pay | Admitting: Pharmacist

## 2019-04-19 NOTE — Telephone Encounter (Signed)
Unable to fill medications through HF fund as he has part D insurance. Made patient aware of goodrx coupons for torsemide ($20.00/month). Will submit for manufacturer's assistance for Xarelto. He will talk with [facility] counselor regarding how to choose a part D plan for next year that has his chronic medications for a lower cost.   Audry Riles, PharmD, BCPS, [facility] Pharmacist [phone]

## 2019-04-19 NOTE — Telephone Encounter (Signed)
Left message on voicemail with name and callback number Colbie Danner RN Navigator Cardiac Imaging [facility] [phone] Office [phone] Cell  

## 2019-04-21 ENCOUNTER — Ambulatory Visit (HOSPITAL_COMMUNITY)
Admission: RE | Admit: 2019-04-21 | Discharge: 2019-04-21 | Disposition: A | Discharge status: Home / Self Care | Payer: Medicare HMO | Source: Ambulatory Visit | Attending: Internal Medicine | Admitting: Internal Medicine

## 2019-04-21 ENCOUNTER — Other Ambulatory Visit: Payer: Self-pay

## 2019-04-21 ENCOUNTER — Other Ambulatory Visit: Payer: Self-pay | Admitting: Internal Medicine

## 2019-04-21 DIAGNOSIS — I4891 Unspecified atrial fibrillation: Secondary | ICD-10-CM | POA: Insufficient documentation

## 2019-04-21 MED ORDER — IOHEXOL 350 MG/ML SOLN
80.0000 mL | Freq: Once | INTRAVENOUS | Status: AC | PRN
  Administered 2019-04-21: 80 mL via INTRAVENOUS

## 2019-04-23 ENCOUNTER — Other Ambulatory Visit (HOSPITAL_COMMUNITY)
Admission: RE | Admit: 2019-04-23 | Discharge: 2019-04-23 | Disposition: A | Discharge status: Home / Self Care | Payer: Medicare HMO | Source: Ambulatory Visit | Attending: Internal Medicine | Admitting: Internal Medicine

## 2019-04-23 DIAGNOSIS — Z20828 Contact with and (suspected) exposure to other viral communicable diseases: Secondary | ICD-10-CM | POA: Diagnosis not present

## 2019-04-23 DIAGNOSIS — Z01812 Encounter for preprocedural laboratory examination: Secondary | ICD-10-CM | POA: Insufficient documentation

## 2019-04-24 LAB — NOVEL CORONAVIRUS, NAA (HOSP ORDER, SEND-OUT TO REF LAB; TAT 18-24 HRS): SARS-CoV-2, NAA: NOT DETECTED

## 2019-04-25 ENCOUNTER — Encounter (HOSPITAL_COMMUNITY): Payer: Self-pay | Admitting: Anesthesiology

## 2019-04-25 NOTE — Anesthesia Preprocedure Evaluation (Addendum)
Anesthesia Evaluation  Patient identified by MRN, date of birth, ID band Patient awake    Reviewed: Allergy & Precautions, NPO status , Patient's Chart, lab work & pertinent test results  History of Anesthesia Complications (+) PONV  Airway Mallampati: II  TM Distance: >3 FB Neck ROM: Full    Dental  (+) Teeth Intact, Dental Advisory Given   Pulmonary sleep apnea and Continuous Positive Airway Pressure Ventilation ,    breath sounds clear to auscultation       Cardiovascular hypertension, Pt. on medications and Pt. on home beta blockers +CHF  + dysrhythmias Atrial Fibrillation  Rhythm:Irregular Rate:Normal  - s/p MVR, AVR and MAZE   Neuro/Psych negative neurological ROS     GI/Hepatic negative GI ROS, Neg liver ROS,   Endo/Other  negative endocrine ROS  Renal/GU negative Renal ROS     Musculoskeletal  (+) Arthritis ,   Abdominal (+) + obese,   Peds  Hematology negative hematology ROS (+)   Anesthesia Other Findings   Reproductive/Obstetrics                           1. Left ventricular ejection fraction, by visual estimation, is 20 to 25%. The left ventricle has severely decreased function. Moderately increased left ventricular size. There is mildly increased left ventricular hypertrophy.  2. Global right ventricle has normal systolic function.The right ventricular size is mildly enlarged. No increase in right ventricular wall thickness.  3. Left atrial size was severely dilated.  4. Right atrial size was normal.  5. The mitral valve is abnormal. No evidence of mitral valve regurgitation. No evidence of mitral stenosis.  6. Post mitral valve repair with annuloplasty ring, 8mm.     Mean gradient 38mmHg.  7. The tricuspid valve is normal in structure. Tricuspid valve regurgitation was not visualized by color flow Doppler.  8. The aortic valve is abnormal Aortic valve regurgitation was not  visualized by color flow Doppler. Structurally normal aortic valve, with no evidence of sclerosis or stenosis. 64mm HAART 300 Aortic Annuloplasty Device valve is present in the aortic  position. Procedure Date: 05/18/2018  9. The pulmonic valve was normal in structure. Pulmonic valve regurgitation is not visualized by color flow Doppler. 10. There is mild dilatation of the ascending aorta measuring 41 mm. 11. Normal pulmonary artery systolic pressure. 12. The inferior vena cava is normal in size with greater than 50% respiratory variability, suggesting right atrial pressure of 3 mmHg. 13. Aortic valve peak gradient measures 15.8 mmHg. 14. Aortic valve mean gradient measures 8.0 mmHg. 15. Left ventricular diastolic Doppler parameters are consistent with pseudonormalization pattern of LV diastolic filling.   Anesthesia Physical Anesthesia Plan  ASA: IV  Anesthesia Plan: General   Post-op Pain Management:    Induction: Intravenous  PONV Risk Score and Plan: 4 or greater and Ondansetron, Dexamethasone and Treatment may vary due to age or medical condition  Airway Management Planned: Oral ETT  Additional Equipment: Arterial line  Intra-op Plan:   Post-operative Plan:   Informed Consent:   Plan Discussed with: CRNA  Anesthesia Plan Comments:         Anesthesia Quick Evaluation

## 2019-04-26 ENCOUNTER — Encounter (HOSPITAL_COMMUNITY)
Admission: RE | Disposition: A | Discharge status: Home / Self Care | Payer: Self-pay | Source: Home / Self Care | Attending: Internal Medicine

## 2019-04-26 ENCOUNTER — Other Ambulatory Visit: Payer: Self-pay

## 2019-04-26 ENCOUNTER — Ambulatory Visit (HOSPITAL_COMMUNITY)
Admission: RE | Admit: 2019-04-26 | Discharge: 2019-04-26 | Disposition: A | Discharge status: Home / Self Care | Payer: Medicare HMO | Attending: Internal Medicine | Admitting: Internal Medicine

## 2019-04-26 ENCOUNTER — Ambulatory Visit (HOSPITAL_COMMUNITY): Payer: Medicare HMO | Admitting: Anesthesiology

## 2019-04-26 DIAGNOSIS — Z96641 Presence of right artificial hip joint: Secondary | ICD-10-CM | POA: Insufficient documentation

## 2019-04-26 DIAGNOSIS — I110 Hypertensive heart disease with heart failure: Secondary | ICD-10-CM | POA: Diagnosis not present

## 2019-04-26 DIAGNOSIS — E669 Obesity, unspecified: Secondary | ICD-10-CM | POA: Insufficient documentation

## 2019-04-26 DIAGNOSIS — I447 Left bundle-branch block, unspecified: Secondary | ICD-10-CM | POA: Diagnosis not present

## 2019-04-26 DIAGNOSIS — Z96651 Presence of right artificial knee joint: Secondary | ICD-10-CM | POA: Insufficient documentation

## 2019-04-26 DIAGNOSIS — I5043 Acute on chronic combined systolic (congestive) and diastolic (congestive) heart failure: Secondary | ICD-10-CM | POA: Diagnosis not present

## 2019-04-26 DIAGNOSIS — I4819 Other persistent atrial fibrillation: Secondary | ICD-10-CM | POA: Insufficient documentation

## 2019-04-26 DIAGNOSIS — I10 Essential (primary) hypertension: Secondary | ICD-10-CM | POA: Diagnosis not present

## 2019-04-26 DIAGNOSIS — I484 Atypical atrial flutter: Secondary | ICD-10-CM | POA: Insufficient documentation

## 2019-04-26 DIAGNOSIS — G4733 Obstructive sleep apnea (adult) (pediatric): Secondary | ICD-10-CM | POA: Diagnosis not present

## 2019-04-26 DIAGNOSIS — M1990 Unspecified osteoarthritis, unspecified site: Secondary | ICD-10-CM | POA: Diagnosis not present

## 2019-04-26 DIAGNOSIS — Z79899 Other long term (current) drug therapy: Secondary | ICD-10-CM | POA: Insufficient documentation

## 2019-04-26 DIAGNOSIS — I428 Other cardiomyopathies: Secondary | ICD-10-CM | POA: Insufficient documentation

## 2019-04-26 DIAGNOSIS — Z6836 Body mass index (BMI) 36.0-36.9, adult: Secondary | ICD-10-CM | POA: Diagnosis not present

## 2019-04-26 DIAGNOSIS — Z7901 Long term (current) use of anticoagulants: Secondary | ICD-10-CM | POA: Insufficient documentation

## 2019-04-26 DIAGNOSIS — I4892 Unspecified atrial flutter: Secondary | ICD-10-CM

## 2019-04-26 HISTORY — PX: ATRIAL FIBRILLATION ABLATION: EP1191

## 2019-04-26 LAB — POCT ACTIVATED CLOTTING TIME
Activated Clotting Time: 230 seconds
Activated Clotting Time: 257 seconds

## 2019-04-26 SURGERY — ATRIAL FIBRILLATION ABLATION
Anesthesia: General

## 2019-04-26 MED ORDER — HEPARIN (PORCINE) IN NACL 1000-0.9 UT/500ML-% IV SOLN
INTRAVENOUS | Status: DC | PRN
  Administered 2019-04-26: 500 mL

## 2019-04-26 MED ORDER — ONDANSETRON HCL 4 MG/2ML IJ SOLN
INTRAMUSCULAR | Status: DC | PRN
  Administered 2019-04-26: 4 mg via INTRAVENOUS

## 2019-04-26 MED ORDER — SUGAMMADEX SODIUM 200 MG/2ML IV SOLN
INTRAVENOUS | Status: DC | PRN
  Administered 2019-04-26: 230 mg via INTRAVENOUS

## 2019-04-26 MED ORDER — SODIUM CHLORIDE 0.9 % IV SOLN
INTRAVENOUS | Status: DC
  Administered 2019-04-26: 06:00:00 via INTRAVENOUS

## 2019-04-26 MED ORDER — ROCURONIUM BROMIDE 10 MG/ML (PF) SYRINGE
PREFILLED_SYRINGE | INTRAVENOUS | Status: DC | PRN
  Administered 2019-04-26: 70 mg via INTRAVENOUS
  Administered 2019-04-26 (×2): 20 mg via INTRAVENOUS

## 2019-04-26 MED ORDER — SODIUM CHLORIDE 0.9 % IV SOLN
INTRAVENOUS | Status: DC | PRN
  Administered 2019-04-26: 10 ug/min via INTRAVENOUS

## 2019-04-26 MED ORDER — HEPARIN SODIUM (PORCINE) 1000 UNIT/ML IJ SOLN
INTRAMUSCULAR | Status: DC | PRN
  Administered 2019-04-26: 14000 [IU] via INTRAVENOUS
  Administered 2019-04-26: 1000 [IU] via INTRAVENOUS

## 2019-04-26 MED ORDER — SODIUM CHLORIDE 0.9% FLUSH: 3.0000 mL | INTRAVENOUS | Status: DC | PRN

## 2019-04-26 MED ORDER — PANTOPRAZOLE SODIUM 40 MG PO TBEC: 40.0000 mg | DELAYED_RELEASE_TABLET | Freq: Every day | ORAL | 0 refills | Status: DC

## 2019-04-26 MED ORDER — PROPOFOL 10 MG/ML IV BOLUS
INTRAVENOUS | Status: DC | PRN
  Administered 2019-04-26: 40 mg via INTRAVENOUS
  Administered 2019-04-26: 20 mg via INTRAVENOUS
  Administered 2019-04-26: 70 mg via INTRAVENOUS

## 2019-04-26 MED ORDER — DEXAMETHASONE SODIUM PHOSPHATE 10 MG/ML IJ SOLN
INTRAMUSCULAR | Status: DC | PRN
  Administered 2019-04-26: 8 mg via INTRAVENOUS

## 2019-04-26 MED ORDER — EPHEDRINE SULFATE-NACL 50-0.9 MG/10ML-% IV SOSY
PREFILLED_SYRINGE | INTRAVENOUS | Status: DC | PRN
  Administered 2019-04-26: 10 mg via INTRAVENOUS

## 2019-04-26 MED ORDER — FENTANYL CITRATE (PF) 100 MCG/2ML IJ SOLN
INTRAMUSCULAR | Status: DC | PRN
  Administered 2019-04-26 (×2): 25 ug via INTRAVENOUS
  Administered 2019-04-26: 50 ug via INTRAVENOUS

## 2019-04-26 MED ORDER — HEPARIN SODIUM (PORCINE) 1000 UNIT/ML IJ SOLN
INTRAMUSCULAR | Status: AC
  Filled 2019-04-26: qty 1

## 2019-04-26 MED ORDER — BUPIVACAINE HCL (PF) 0.25 % IJ SOLN
INTRAMUSCULAR | Status: DC | PRN
  Administered 2019-04-26: 30 mL

## 2019-04-26 MED ORDER — ONDANSETRON HCL 4 MG/2ML IJ SOLN: 4.0000 mg | Freq: Four times a day (QID) | INTRAMUSCULAR | Status: DC | PRN

## 2019-04-26 MED ORDER — SODIUM CHLORIDE 0.9 % IV SOLN
INTRAVENOUS | Status: DC | PRN
  Administered 2019-04-26: 08:00:00 via INTRAVENOUS

## 2019-04-26 MED ORDER — PROTAMINE SULFATE 10 MG/ML IV SOLN
INTRAVENOUS | Status: DC | PRN
  Administered 2019-04-26: 30 mg via INTRAVENOUS
  Administered 2019-04-26: 10 mg via INTRAVENOUS

## 2019-04-26 MED ORDER — LIDOCAINE 2% (20 MG/ML) 5 ML SYRINGE
INTRAMUSCULAR | Status: DC | PRN
  Administered 2019-04-26: 40 mg via INTRAVENOUS

## 2019-04-26 MED ORDER — SODIUM CHLORIDE 0.9% FLUSH: 3.0000 mL | Freq: Two times a day (BID) | INTRAVENOUS | Status: DC

## 2019-04-26 MED ORDER — MIDAZOLAM HCL 2 MG/2ML IJ SOLN
INTRAMUSCULAR | Status: DC | PRN
  Administered 2019-04-26: 2 mg via INTRAVENOUS

## 2019-04-26 MED ORDER — SODIUM CHLORIDE 0.9 % IV SOLN: 250.0000 mL | INTRAVENOUS | Status: DC | PRN

## 2019-04-26 MED ORDER — RIVAROXABAN 20 MG PO TABS
20.0000 mg | ORAL_TABLET | Freq: Once | ORAL | Status: AC
  Administered 2019-04-26: 20 mg via ORAL
  Filled 2019-04-26: qty 1

## 2019-04-26 MED ORDER — HEPARIN (PORCINE) IN NACL 1000-0.9 UT/500ML-% IV SOLN
INTRAVENOUS | Status: AC
  Filled 2019-04-26: qty 500

## 2019-04-26 MED ORDER — BUPIVACAINE HCL (PF) 0.25 % IJ SOLN
INTRAMUSCULAR | Status: AC
  Filled 2019-04-26: qty 30

## 2019-04-26 MED ORDER — HEPARIN SODIUM (PORCINE) 1000 UNIT/ML IJ SOLN
INTRAMUSCULAR | Status: DC | PRN
  Administered 2019-04-26: 5000 [IU] via INTRAVENOUS

## 2019-04-26 MED ORDER — PHENYLEPHRINE 40 MCG/ML (10ML) SYRINGE FOR IV PUSH (FOR BLOOD PRESSURE SUPPORT)
PREFILLED_SYRINGE | INTRAVENOUS | Status: DC | PRN
  Administered 2019-04-26: 40 ug via INTRAVENOUS

## 2019-04-26 MED ORDER — ACETAMINOPHEN 325 MG PO TABS: 650.0000 mg | ORAL_TABLET | ORAL | Status: DC | PRN

## 2019-04-26 SURGICAL SUPPLY — 19 items
BLANKET WARM UNDERBOD FULL ACC (MISCELLANEOUS) ×2 IMPLANT
CATH MAPPNG PENTARAY F 2-6-2MM (CATHETERS) ×1 IMPLANT
CATH NAVISTAR SMARTTOUCH DF (ABLATOR) ×2 IMPLANT
CATH SOUNDSTAR ECO 8FR (CATHETERS) ×2 IMPLANT
CATH WEBSTER BI DIR CS D-F CRV (CATHETERS) ×2 IMPLANT
COVER SWIFTLINK CONNECTOR (BAG) ×2 IMPLANT
DEVICE CLOSURE PERCLS PRGLD 6F (VASCULAR PRODUCTS) ×3 IMPLANT
NEEDLE BAYLIS TRANSSEPTAL 71CM (NEEDLE) ×2 IMPLANT
PACK EP LATEX FREE (CUSTOM PROCEDURE TRAY) ×1
PACK EP LF (CUSTOM PROCEDURE TRAY) ×1 IMPLANT
PAD PRO RADIOLUCENT 2001M-C (PAD) ×2 IMPLANT
PATCH CARTO3 (PAD) ×2 IMPLANT
PENTARAY F 2-6-2MM (CATHETERS) ×2
PERCLOSE PROGLIDE 6F (VASCULAR PRODUCTS) ×6
SHEATH PINNACLE 7F 10CM (SHEATH) ×4 IMPLANT
SHEATH PINNACLE 8F 10CM (SHEATH) ×2 IMPLANT
SHEATH PROBE COVER 6X72 (BAG) ×2 IMPLANT
SHEATH SWARTZ TS SL2 63CM 8.5F (SHEATH) ×2 IMPLANT
TUBING SMART ABLATE COOLFLOW (TUBING) ×2 IMPLANT

## 2019-04-26 NOTE — Progress Notes (Signed)
Slight red rash like area noted to bilat groin and peri area. Pt states that its always like that. Area noted before prep and clip done.

## 2019-04-26 NOTE — Progress Notes (Signed)
Upon arrival patients HR low 40s high 30's with afib and pvcs. Rhythm changing multple times. CRNA at bedside. Dr Rayann Heman paged to bedside.  10mg  of ephedrine given and hr came up to 50's.  Dr Rayann Heman at bedside- no new orders received.

## 2019-04-26 NOTE — Discharge Instructions (Signed)
Post procedure care instructions No driving for 4 days. No lifting over 5 lbs for 1 week. No vigorous or sexual activity for 1 week. You may return to work on 05/03/2019. Keep procedure site clean & dry. If you notice increased pain, swelling, bleeding or pus, call/return!  You may shower, but no soaking baths/hot tubs/pools for 1 week.     Cardiac Ablation, Care After This sheet gives you information about how to care for yourself after your procedure. Your health care provider may also give you more specific instructions. If you have problems or questions, contact your health care provider. What can I expect after the procedure? After the procedure, it is common to have:  Bruising around your puncture site.  Tenderness around your puncture site.  Skipped heartbeats.  Tiredness (fatigue).  Follow these instructions at home: Puncture site care   Follow instructions from your health care provider about how to take care of your puncture site. Make sure you: ? If present, leave stitches (sutures), skin glue, or adhesive strips in place. These skin closures may need to stay in place for up to 2 weeks. If adhesive strip edges start to loosen and curl up, you may trim the loose edges. Do not remove adhesive strips completely unless your health care provider tells you to do that.  Check your puncture site every day for signs of infection. Check for: ? Redness, swelling, or pain. ? Fluid or blood. If your puncture site starts to bleed, lie down on your back, apply firm pressure to the area, and contact your health care provider. ? Warmth. ? Pus or a bad smell. Driving  Do not drive for at least 4 days after your procedure or however long your health care provider recommends.  Do not drive or use heavy machinery while taking prescription pain medicine.  Do not drive for 24 hours if you were given a medicine to help you relax (sedative) during your procedure. Activity  Avoid activities  that take a lot of effort for at least 7 days after your procedure.  Do not lift anything that is heavier than 5 lb (4.5 kg) for one week.   No sexual activity for 1 week.   Return to your normal activities as told by your health care provider. Ask your health care provider what activities are safe for you. General instructions  Take over-the-counter and prescription medicines only as told by your health care provider.  Do not use any products that contain nicotine or tobacco, such as cigarettes and e-cigarettes. If you need help quitting, ask your health care provider.  You may shower after 24 hours, but Do not take baths, swim, or use a hot tub for 1 week.   Do not drink alcohol for 24 hours after your procedure.  Keep all follow-up visits as told by your health care provider. This is important. Contact a health care provider if:  You have redness, mild swelling, or pain around your puncture site.  You have fluid or blood coming from your puncture site that stops after applying firm pressure to the area.  Your puncture site feels warm to the touch.  You have pus or a bad smell coming from your puncture site.  You have a fever.  You have chest pain or discomfort that spreads to your neck, jaw, or arm.  You are sweating a lot.  You feel nauseous.  You have a fast or irregular heartbeat.  You have shortness of breath.  You are  dizzy or light-headed and feel the need to lie down.  You have pain or numbness in the arm or leg closest to your puncture site. Get help right away if:  Your puncture site suddenly swells.  Your puncture site is bleeding and the bleeding does not stop after applying firm pressure to the area. These symptoms may represent a serious problem that is an emergency. Do not wait to see if the symptoms will go away. Get medical help right away. Call your local emergency services (911 in the [place] Do not drive yourself to the hospital. Summary  After  the procedure, it is normal to have bruising and tenderness at the puncture site in your groin, neck, or forearm.  Check your puncture site every day for signs of infection.  Get help right away if your puncture site is bleeding and the bleeding does not stop after applying firm pressure to the area. This is a medical emergency. This information is not intended to replace advice given to you by your health care provider. Make sure you discuss any questions you have with your health care provider.    You have an appointment set up with the [facility].  Multiple studies have shown that being followed by a dedicated atrial fibrillation clinic in addition to the standard care you receive from your other physicians improves health. We believe that enrollment in the atrial fibrillation clinic will allow [place] to better care for you.   The phone number to the [facility] is [phone]. The clinic is staffed Monday through Friday from 8:30am to 5pm.  Parking Directions: The clinic is located in the Heart and Vascular Building connected to [facility]. 1)From [address] turn on to [address] and go to the 3rd entrance  (Heart and Vascular entrance) on the right. 2)Look to the right for Heart &Vascular Parking Garage. 3)A code for the entrance is required please call the clinic to receive this.   4)Take the elevators to the 1st floor. Registration is in the room with the glass walls at the end of the hallway.  If you have any trouble parking or locating the clinic, please dont hesitate to call [phone].

## 2019-04-26 NOTE — Anesthesia Procedure Notes (Addendum)
Procedure Name: Intubation Date/Time: 04/26/2019 7:53 AM Performed by: Leonor Liv, CRNA Pre-anesthesia Checklist: Patient identified, Emergency Drugs available, Suction available and Patient being monitored Patient Re-evaluated:Patient Re-evaluated prior to induction Oxygen Delivery Method: Circle System Utilized Preoxygenation: Pre-oxygenation with 100% oxygen Induction Type: IV induction Ventilation: Oral airway inserted - appropriate to patient size and Mask ventilation without difficulty Laryngoscope Size: Mac and 4 Grade View: Grade III Tube type: Oral Tube size: 7.5 mm Number of attempts: 1 Airway Equipment and Method: Stylet and Oral airway Placement Confirmation: ETT inserted through vocal cords under direct vision,  positive ETCO2 and breath sounds checked- equal and bilateral Secured at: 23 cm Tube secured with: Tape (left) Dental Injury: Teeth and Oropharynx as per pre-operative assessment

## 2019-04-26 NOTE — Progress Notes (Signed)
Dr Allred in and ok to d/c home; client up and walked and tolerated well; right groin stable, no bleeding or hematoma 

## 2019-04-26 NOTE — Transfer of Care (Signed)
Immediate Anesthesia Transfer of Care Note  Patient: [name]  Procedure(s) Performed: ATRIAL FIBRILLATION ABLATION (N/A )  Patient Location: Cath Lab  Anesthesia Type:General  Level of Consciousness: awake, alert  and oriented  Airway & Oxygen Therapy: Patient Spontanous Breathing and Patient connected to face mask oxygen  Post-op Assessment: Report given to RN, Post -op Vital signs reviewed and stable and Patient moving all extremities  Post vital signs: Reviewed and stable  Last Vitals:  Vitals Value Taken Time  BP    Temp    Pulse 99 04/26/19 1036  Resp 23 04/26/19 1036  SpO2 94 % 04/26/19 1036  Vitals shown include unvalidated device data.  Last Pain:  Vitals:   04/26/19 0558  TempSrc:   PainSc: 0-No pain      Patients Stated Pain Goal: 8 ([id number])  Complications: No apparent anesthesia complications

## 2019-04-26 NOTE — Anesthesia Postprocedure Evaluation (Signed)
Anesthesia Post Note  Patient: [name]  Procedure(s) Performed: ATRIAL FIBRILLATION ABLATION (N/A )     Patient location during evaluation: PACU Anesthesia Type: General Level of consciousness: awake and alert Pain management: pain level controlled Vital Signs Assessment: post-procedure vital signs reviewed and stable Respiratory status: spontaneous breathing, nonlabored ventilation, respiratory function stable and patient connected to nasal cannula oxygen Cardiovascular status: blood pressure returned to baseline and stable Postop Assessment: no apparent nausea or vomiting Anesthetic complications: no    Last Vitals:  Vitals:   04/26/19 1315 04/26/19 1401  BP: 133/84 (!) 142/58  Pulse:  (!) 108  Resp:  16  Temp:    SpO2: 96% 94%    Last Pain:  Vitals:   04/26/19 1315  TempSrc:   PainSc: 0-No pain                 [name]

## 2019-04-26 NOTE — Interval H&P Note (Signed)
History and Physical Interval Note:  04/26/2019 7:14 AM  [name]  has presented today for surgery, with the diagnosis of afib.  The various methods of treatment have been discussed with the patient and family. After consideration of risks, benefits and other options for treatment, the patient has consented to  Procedure(s): ATRIAL FIBRILLATION ABLATION (N/A) as a surgical intervention.  The patient's history has been reviewed, patient examined, no change in status, stable for surgery.  I have reviewed the patient's chart and labs.  Questions were answered to the patient's satisfaction.    Cardiac CT reviewed with patient.  He reports compliance with xarelto without interruption.  Thompson Grayer

## 2019-04-26 NOTE — Anesthesia Procedure Notes (Signed)
Arterial Line Insertion Start/End10/13/2020 7:00 AM, 04/26/2019 7:25 AM Performed by: Leonor Liv, CRNA  Patient location: Pre-op. Preanesthetic checklist: patient identified, IV checked, site marked, risks and benefits discussed, surgical consent, monitors and equipment checked, pre-op evaluation, timeout performed and anesthesia consent Lidocaine 1% used for infiltration Left, radial was placed Catheter size: 20 G Hand hygiene performed  and maximum sterile barriers used  Allen's test indicative of satisfactory collateral circulation Attempts: 1 Procedure performed without using ultrasound guided technique. Following insertion, Biopatch and dressing applied. Post procedure assessment: normal  Patient tolerated the procedure well with no immediate complications.

## 2019-04-26 NOTE — Progress Notes (Signed)
Site area: RT ARM Site Prior to Removal:  Level 0 Pressure Applied For: 18 MINUTES Manual:   YES Patient Status During Pull:  AWAKE Post Pull Site:  Level O Post Pull Instructions Given:YES   Post Pull Pulses Present: RT RADIAL Dressing Applied:  YES Bedrest begins @ 12:50 Comments:

## 2019-04-27 ENCOUNTER — Encounter (HOSPITAL_COMMUNITY): Payer: Self-pay | Admitting: Internal Medicine

## 2019-04-27 NOTE — Telephone Encounter (Signed)
Pt evaluated by Dr. Aundra Dubin on 9/11

## 2019-04-28 ENCOUNTER — Encounter (HOSPITAL_COMMUNITY): Payer: Medicare HMO | Admitting: Cardiology

## 2019-05-01 DIAGNOSIS — G4733 Obstructive sleep apnea (adult) (pediatric): Secondary | ICD-10-CM | POA: Diagnosis not present

## 2019-05-17 ENCOUNTER — Other Ambulatory Visit (HOSPITAL_COMMUNITY): Payer: Self-pay | Admitting: Cardiology

## 2019-05-24 ENCOUNTER — Ambulatory Visit (HOSPITAL_COMMUNITY): Payer: Medicare HMO | Admitting: Nurse Practitioner

## 2019-05-25 ENCOUNTER — Other Ambulatory Visit: Payer: Self-pay

## 2019-05-25 ENCOUNTER — Encounter (HOSPITAL_COMMUNITY): Payer: Self-pay | Admitting: Physician Assistant

## 2019-05-25 ENCOUNTER — Ambulatory Visit (HOSPITAL_COMMUNITY)
Admission: RE | Admit: 2019-05-25 | Discharge: 2019-05-25 | Disposition: A | Discharge status: Home / Self Care | Payer: Medicare HMO | Source: Ambulatory Visit | Attending: Physician Assistant | Admitting: Physician Assistant

## 2019-05-25 VITALS — BP 140/60 | HR 47 | Ht 70.0 in | Wt 265.4 lb

## 2019-05-25 DIAGNOSIS — Z952 Presence of prosthetic heart valve: Secondary | ICD-10-CM | POA: Diagnosis not present

## 2019-05-25 DIAGNOSIS — I5042 Chronic combined systolic (congestive) and diastolic (congestive) heart failure: Secondary | ICD-10-CM | POA: Diagnosis not present

## 2019-05-25 DIAGNOSIS — Z888 Allergy status to other drugs, medicaments and biological substances status: Secondary | ICD-10-CM | POA: Insufficient documentation

## 2019-05-25 DIAGNOSIS — G4733 Obstructive sleep apnea (adult) (pediatric): Secondary | ICD-10-CM | POA: Insufficient documentation

## 2019-05-25 DIAGNOSIS — I4819 Other persistent atrial fibrillation: Secondary | ICD-10-CM

## 2019-05-25 DIAGNOSIS — I110 Hypertensive heart disease with heart failure: Secondary | ICD-10-CM | POA: Diagnosis not present

## 2019-05-25 DIAGNOSIS — Z7901 Long term (current) use of anticoagulants: Secondary | ICD-10-CM | POA: Insufficient documentation

## 2019-05-25 DIAGNOSIS — I4892 Unspecified atrial flutter: Secondary | ICD-10-CM | POA: Insufficient documentation

## 2019-05-25 DIAGNOSIS — Z6838 Body mass index (BMI) 38.0-38.9, adult: Secondary | ICD-10-CM | POA: Diagnosis not present

## 2019-05-25 DIAGNOSIS — Z8249 Family history of ischemic heart disease and other diseases of the circulatory system: Secondary | ICD-10-CM | POA: Diagnosis not present

## 2019-05-25 DIAGNOSIS — D6869 Other thrombophilia: Secondary | ICD-10-CM

## 2019-05-25 DIAGNOSIS — I38 Endocarditis, valve unspecified: Secondary | ICD-10-CM | POA: Insufficient documentation

## 2019-05-25 DIAGNOSIS — Z79899 Other long term (current) drug therapy: Secondary | ICD-10-CM | POA: Diagnosis not present

## 2019-05-25 DIAGNOSIS — E669 Obesity, unspecified: Secondary | ICD-10-CM | POA: Diagnosis not present

## 2019-05-25 DIAGNOSIS — Z885 Allergy status to narcotic agent status: Secondary | ICD-10-CM | POA: Insufficient documentation

## 2019-05-25 DIAGNOSIS — I471 Supraventricular tachycardia: Secondary | ICD-10-CM | POA: Diagnosis not present

## 2019-05-25 NOTE — Progress Notes (Signed)
Primary Care Physician: Seward Carol, MD Primary Cardiologist: Dr Gwenlyn Found Primary Electrophysiologist: Dr Rayann Heman Referring Physician: Dr Rayann Heman [facility]: Dr Cassandria Santee is a 68 y.o. male with a history of chronic combined systolic and diastolic heart failure, persistent atrial fibrillation, chronic anticoagulation, hypertensive cardiovascular disease, mitral regurgitation, OSA on C-pap, and obesity. He underwent AVR, mitral valve repair, maze procedure, and LAAexcisionon 05/18/18 with postoperative afib. He was initially on amiodarone but this was stopped 2/2 bradycardia. In 02/2019, he was found to have atrial tachycardia vs atypical atrial flutter with elevated V rates. He underwent DCCV on 03/10/19. Unfortunately, about two days later patient felt his heart rate increase and he has more dyspnea with exertion. He is on Xarelto for a CHADS2VASC score of 2.  On follow up today, patient is s/p afib ablation with Dr Rayann Heman on 04/26/19. Patient reports that since his ablation he has done reasonably well. He states that his fatigue and SOB have improved and he no longer has symptoms of palpitations. He does admit that he ran out of spironolactone a few days ago. He denies CP, swallowing, or groin issues.   Today, he denies symptoms of palpitations, chest pain, shortness of breath, orthopnea, PND, dizziness, presyncope, syncope, snoring, daytime somnolence, bleeding, or neurologic sequela. The patient is tolerating medications without difficulties and is otherwise without complaint today.    Atrial Fibrillation Risk Factors:  he does have symptoms or diagnosis of sleep apnea. he is compliant with CPAP therapy.  he has a BMI of Body mass index is 38.08 kg/m.Marland Kitchen Filed Weights   05/25/19 0924  Weight: 120.4 kg    Family History  Problem Relation Age of Onset  . Other Other        mother had a pacemaker  . Hypertension Father      Atrial Fibrillation Management history:   Previous antiarrhythmic drugs: amiodarone Previous cardioversions: 11/30/18, 03/10/19 Previous ablations: MAZE 2019, 04/26/19 CHADS2VASC score: 2 Anticoagulation history: Xarelto    Past Medical History:  Diagnosis Date  . Aortic insufficiency 04/21/2018  . Aortic insufficiency   . Arthritis    "joints" (09/26/2013)  . Atrial enlargement, left   . Dyspnea    with exertion  . Hearing aid worn    both ears  . Hypertension   . Mitral regurgitation 04/21/2018  . Obesity   . OSA on CPAP    "mask adjusts to what I need" (09/26/2013)  . Persistent atrial fibrillation ([place])    cardioversion 06/06/13  . PONV (postoperative nausea and vomiting)   . Rotator cuff tear, right dec 2012   physical therapy done, decreased strength  . S/P aortic valve repair 05/18/2018   Complex valvuloplasty including plication of right coronary leaflet and 21 mm Biostable HAART annuloplasty ring  . S/P Maze operation for atrial fibrillation 05/18/2018   Complete bilateral atrial lesion set using bipolar radiofrequency and cryothermy ablation with clipping of LA appendage  . S/P mitral valve repair 05/18/2018   Complex valvuloplasty including artificial Gore-tex neochord placement x4 and 30 mm Sorin Memo 4D ring annuloplasty  . Wears glasses    Past Surgical History:  Procedure Laterality Date  . AORTIC VALVE REPAIR N/A 05/18/2018   Procedure: AORTIC VALVE REPAIR using HAART 300 Aortic Annuloplasty Device size 24mm;  Surgeon: Rexene Alberts, MD;  Location: [place];  Service: Open Heart Surgery;  Laterality: N/A;  . ATRIAL FIBRILLATION ABLATION N/A 04/26/2019   Procedure: ATRIAL FIBRILLATION ABLATION;  Surgeon: Thompson Grayer, MD;  Location: [place] [facility];  Service: Cardiovascular;  Laterality: N/A;  . CARDIOVERSION N/A 06/06/2013   Procedure: CARDIOVERSION;  Surgeon: Sanda Klein, MD;  Location: [place] [facility];  Service: Cardiovascular;  Laterality: N/A;  . CARDIOVERSION N/A 09/28/2013   Procedure:  CARDIOVERSION BEDSIDE;  Surgeon: Peter M Martinique, MD;  Location: [place];  Service: Cardiovascular;  Laterality: N/A;  . CARDIOVERSION N/A 11/30/2018   Procedure: CARDIOVERSION;  Surgeon: Dorothy Spark, MD;  Location: [facility];  Service: Cardiovascular;  Laterality: N/A;  . CARDIOVERSION N/A 03/10/2019   Procedure: CARDIOVERSION;  Surgeon: [name], MD;  Location: [place];  Service: Endoscopy;  Laterality: N/A;  . CARPAL TUNNEL RELEASE Right 08/08/2013   Procedure: RIGHT CARPAL TUNNEL RELEASE;  Surgeon: Wynonia Sours, MD;  Location: [place];  Service: Orthopedics;  Laterality: Right;  . CARPAL TUNNEL RELEASE Left 2008  . CLIPPING OF ATRIAL APPENDAGE  05/18/2018   Procedure: CLIPPING OF ATRIAL APPENDAGE using AtriCure Clip size 45;  Surgeon: Rexene Alberts, MD;  Location: [place];  Service: Open Heart Surgery;;  . KNEE ARTHROSCOPY Right 1990's  . MAZE N/A 05/18/2018   Procedure: MAZE;  Surgeon: Rexene Alberts, MD;  Location: [place];  Service: Open Heart Surgery;  Laterality: N/A;  . MITRAL VALVE REPAIR N/A 05/18/2018   Procedure: MITRAL VALVE REPAIR (MVR) using 4D Memo Ring size 30;  Surgeon: Rexene Alberts, MD;  Location: [place];  Service: Open Heart Surgery;  Laterality: N/A;  . RIGHT/LEFT HEART CATH AND CORONARY ANGIOGRAPHY N/A 04/21/2018   Procedure: RIGHT/LEFT HEART CATH AND CORONARY ANGIOGRAPHY;  Surgeon: Nelva Bush, MD;  Location: [place] [facility];  Service: Cardiovascular;  Laterality: N/A;  . SHOULDER OPEN ROTATOR CUFF REPAIR Left 1990's  . SHOULDER OPEN ROTATOR CUFF REPAIR Right 11/2007  . TEE WITHOUT CARDIOVERSION N/A 04/21/2018   Procedure: TRANSESOPHAGEAL ECHOCARDIOGRAM (TEE);  Surgeon: Jerline Pain, MD;  Location[facility];  Service: Cardiovascular;  Laterality: N/A;  . TEE WITHOUT CARDIOVERSION N/A 05/18/2018   Procedure: TRANSESOPHAGEAL ECHOCARDIOGRAM (TEE);  Surgeon: Rexene Alberts, MD;  Location: [place];  Service: Open Heart  Surgery;  Laterality: N/A;  . TONSILLECTOMY  1950's  . TOTAL HIP ARTHROPLASTY Left 2010  . TOTAL HIP REVISION Left 10/08/2011  . TOTAL HIP REVISION  04/09/2012   Procedure: TOTAL HIP REVISION;  Surgeon: Gearlean Alf, MD;  Location: [facility];  Service: Orthopedics;  Laterality: Left;  Left Hip Acetabular Revision vs Constrained Liner  . TOTAL KNEE ARTHROPLASTY Right 2006   . TRIGGER FINGER RELEASE Right 08/08/2013   Procedure: RELEASE STENOSING TENOSYNOVITIS RIGHT THUMB;  Surgeon: Wynonia Sours, MD;  Location: [place];  Service: Orthopedics;  Laterality: Right;    Current Outpatient Medications  Medication Sig Dispense Refill  . clobetasol ointment (TEMOVATE) [id number] % Apply 1 application topically daily as needed (rash).    Marland Kitchen KLOR-CON M20 20 MEQ tablet Take 1 tablet (20 mEq total) by mouth 2 (two) times daily. 60 tablet 11  . losartan (COZAAR) 25 MG tablet Take 1 tablet (25 mg total) by mouth daily. 30 tablet 6  . Multiple Vitamin (MULTIVITAMIN WITH MINERALS) TABS tablet Take 1 tablet by mouth 2 (two) times a week.    . Naproxen Sod-diphenhydrAMINE (ALEVE PM) 220-25 MG TABS Take 2 tablets by mouth at bedtime as needed (sleep/pain).    . Omega-3 Fatty Acids (FISH OIL PO) Take 1 capsule by mouth 4 (four) times a week.     Marland Kitchen  OVER THE COUNTER MEDICATION Apply 1 spray topically daily as needed (cramps). Topical Magnesium Spray    . rivaroxaban (XARELTO) 20 MG TABS tablet Take 1 tablet (20 mg total) by mouth daily. 30 tablet 5  . spironolactone (ALDACTONE) 25 MG tablet TAKE 1/2 TABLET BY MOUTH EVERY DAY 45 tablet 2  . torsemide (DEMADEX) 20 MG tablet Take 4 tablets (80 mg total) by mouth daily. 120 tablet 6   No current facility-administered medications for this encounter.     Allergies  Allergen Reactions  . Metoprolol Tartrate Shortness Of Breath    Shortness of breath, pressure in chest and back  . Oxycodone Rash and Other (See Comments)    Can tolerate Hydrocodone     Social History   Socioeconomic History  . Marital status: Married    Spouse name: Not on file  . Number of children: Not on file  . Years of education: Not on file  . Highest education level: Not on file  Occupational History  . Occupation: Agricultural consultant  Social Needs  . Financial resource strain: Not on file  . Food insecurity    Worry: Not on file    Inability: Not on file  . Transportation needs    Medical: Not on file    Non-medical: Not on file  Tobacco Use  . Smoking status: Never Smoker  . Smokeless tobacco: Never Used  Substance and Sexual Activity  . Alcohol use: No  . Drug use: No  . Sexual activity: Yes  Lifestyle  . Physical activity    Days per week: Not on file    Minutes per session: Not on file  . Stress: Not on file  Relationships  . Social Herbalist on phone: Not on file    Gets together: Not on file    Attends religious service: Not on file    Active member of club or organization: Not on file    Attends meetings of clubs or organizations: Not on file    Relationship status: Not on file  . Intimate partner violence    Fear of current or ex partner: Not on file    Emotionally abused: Not on file    Physically abused: Not on file    Forced sexual activity: Not on file  Other Topics Concern  . Not on file  Social History Narrative   Pt lives in [place] with spouse.  Leadership training and behavioral safety training.     ROS- All systems are reviewed and negative except as per the HPI above.  Physical Exam: Vitals:   05/25/19 0924  BP: 140/60  Pulse: (!) 47  Weight: 120.4 kg  Height: 5\' 10"  (1.778 m)    GEN- The patient is well appearing obese male, alert and oriented x 3 today.   HEENT-head normocephalic, atraumatic, sclera clear, conjunctiva pink, hearing intact, trachea midline. Lungs- Clear to ausculation bilaterally, normal work of breathing Heart- irregular rate and rhythm, no murmurs, rubs  or gallops  GI- soft, NT, ND, + BS Extremities- no clubbing, cyanosis. Trace bilateral edema MS- no significant deformity or atrophy Skin- no rash or lesion Psych- euthymic mood, full affect Neuro- strength and sensation are intact   Wt Readings from Last 3 Encounters:  05/25/19 120.4 kg  04/26/19 114.3 kg  04/11/19 114.8 kg    EKG today demonstrates afib HR 47 with escape beats, LBBB, QRS 162, QTc 458  Echo 11/01/18 demonstrated   1. The left  ventricle has mild-moderately reduced systolic function, with an ejection fraction of 40-45%. The cavity size was normal. There is mild concentric left ventricular hypertrophy. Left ventricular diastolic function could not be evaluated due  to mitral valve replacement/repair. Left ventrical global hypokinesis without regional wall motion abnormalities.  2. The right ventricle has mildly reduced systolic function. The cavity was moderately enlarged. There is no increase in right ventricular wall thickness. Right ventricular systolic pressure is moderately elevated with an estimated pressure of 62 mmHg.  3. A 30 mm Sorin Memo 4D Ring valve is present in the mitral position. Procedure Date: 05/18/18.  4. Mild thickening of the mitral valve leaflet. No evidence of true mitral valve stenosis.  5. A 32mm Biostable HART Ring valve is present in the aortic position. Procedure Date: 05-18-18.  6. The aortic valve is tricuspid. Mild thickening of the aortic valve. Aortic valve regurgitation is mild by color flow Doppler. The jet is eccentric medially directed. Mild stenosis of the aortic valve.  7. The inferior vena cava was dilated in size with <50% respiratory variability.  8. Left atrial size was severely dilated.  9. Right atrial size was severely dilated. 10. Compared to 05/24/2018 (early postop) report, left ventricular systolic function has decreased, but is similar when compared to all other previous echocardiograms  Epic records are reviewed at length  today  Assessment and Plan:  1. Persistent atrial fibrillation/atrial tachycardia/atrial flutter S/p MAZE 2019. S/p DCCV 03/10/19. Unfortunately had quick return to atrial flutter.  AAD limited by h/o bradycardia and junctional rhythm.  S/p afib ablation 04/26/19 with Dr Rayann Heman. He appears to be in afib today with slow ventricular rates. He states that his BP machine at home has shown both regular and irregular heart rhythms.  Symptomatically he has improved since the ablation. Continue Xarelto 20 mg daily with no missed doses for at least 3 months post ablation.   This patients CHA2DS2-VASc Score and unadjusted Ischemic Stroke Rate (% per year) is equal to 2.2 % stroke rate/year from a score of 2  Above score calculated as 1 point each if present [CHF, HTN, DM, Vascular=MI/PAD/Aortic Plaque, Age if 65-74, or Male] Above score calculated as 2 points each if present [Age > 75, or Stroke/TIA/TE]   2. Obesity Body mass index is 38.08 kg/m. Lifestyle modification was discussed and encouraged including regular physical activity and weight reduction. Patient going to start walking more regularly.   3. Obstructive sleep apnea The importance of adequate treatment of sleep apnea was discussed today in order to improve our ability to maintain sinus rhythm long term. Patient on CPAP therapy.  4. HTN Stable, no changes today.  5. Chronic systolic CHF Recent echo showed EF 20-25% in the setting of rapid afib.  His weight is up today, he admits he has not taken his spironolactone for several days. Encouraged him to resume this.  He also admits to drinking large amounts of water. Again encouraged him to limit is water intake.  Advised patient that if his weight does not improve to reach out to Dr Claris Gladden office. Patient voices understanding.  6. Valvular disease S/p MVR and AVR.   Follow up with [name] and Dr Rayann Heman as scheduled.     [facility] [address] [place], [place] [address] [phone] 05/25/2019 10:03 AM

## 2019-06-01 DIAGNOSIS — G4733 Obstructive sleep apnea (adult) (pediatric): Secondary | ICD-10-CM | POA: Diagnosis not present

## 2019-06-03 ENCOUNTER — Other Ambulatory Visit: Payer: Self-pay | Admitting: Internal Medicine

## 2019-06-13 ENCOUNTER — Encounter (HOSPITAL_COMMUNITY): Payer: Self-pay | Admitting: Cardiology

## 2019-06-13 ENCOUNTER — Other Ambulatory Visit: Payer: Self-pay

## 2019-06-13 ENCOUNTER — Ambulatory Visit (HOSPITAL_COMMUNITY)
Admission: RE | Admit: 2019-06-13 | Discharge: 2019-06-13 | Disposition: A | Discharge status: Home / Self Care | Payer: Medicare HMO | Source: Ambulatory Visit | Attending: Cardiology | Admitting: Cardiology

## 2019-06-13 VITALS — BP 125/56 | HR 53 | Wt 262.2 lb

## 2019-06-13 DIAGNOSIS — G4733 Obstructive sleep apnea (adult) (pediatric): Secondary | ICD-10-CM | POA: Diagnosis not present

## 2019-06-13 DIAGNOSIS — I4819 Other persistent atrial fibrillation: Secondary | ICD-10-CM | POA: Diagnosis not present

## 2019-06-13 DIAGNOSIS — I4892 Unspecified atrial flutter: Secondary | ICD-10-CM | POA: Insufficient documentation

## 2019-06-13 DIAGNOSIS — Z7901 Long term (current) use of anticoagulants: Secondary | ICD-10-CM | POA: Diagnosis not present

## 2019-06-13 DIAGNOSIS — I5022 Chronic systolic (congestive) heart failure: Secondary | ICD-10-CM | POA: Insufficient documentation

## 2019-06-13 DIAGNOSIS — I428 Other cardiomyopathies: Secondary | ICD-10-CM | POA: Insufficient documentation

## 2019-06-13 DIAGNOSIS — R001 Bradycardia, unspecified: Secondary | ICD-10-CM | POA: Diagnosis not present

## 2019-06-13 DIAGNOSIS — I110 Hypertensive heart disease with heart failure: Secondary | ICD-10-CM | POA: Diagnosis not present

## 2019-06-13 DIAGNOSIS — Z79899 Other long term (current) drug therapy: Secondary | ICD-10-CM | POA: Insufficient documentation

## 2019-06-13 DIAGNOSIS — I482 Chronic atrial fibrillation, unspecified: Secondary | ICD-10-CM | POA: Insufficient documentation

## 2019-06-13 DIAGNOSIS — Z8249 Family history of ischemic heart disease and other diseases of the circulatory system: Secondary | ICD-10-CM | POA: Insufficient documentation

## 2019-06-13 LAB — BASIC METABOLIC PANEL WITH GFR
Anion gap: 12 (ref 5–15)
BUN: 40 mg/dL — ABNORMAL HIGH (ref 8–23)
CO2: 25 mmol/L (ref 22–32)
Calcium: 9.4 mg/dL (ref 8.9–10.3)
Chloride: 104 mmol/L (ref 98–111)
Creatinine, Ser: 1.34 mg/dL — ABNORMAL HIGH (ref 0.61–1.24)
GFR calc Af Amer: >60 mL/min
GFR calc non Af Amer: 55 mL/min — ABNORMAL LOW
Glucose, Bld: 83 mg/dL (ref 70–99)
Potassium: 3.5 mmol/L (ref 3.5–5.1)
Sodium: 141 mmol/L (ref 135–145)

## 2019-06-13 MED ORDER — ENTRESTO 24-26 MG PO TABS: 1.0000 | ORAL_TABLET | Freq: Two times a day (BID) | ORAL | 3 refills | Status: DC

## 2019-06-13 NOTE — Progress Notes (Signed)
Medication Samples have been provided to the patient.  Drug name: Xarelto       Strength: 20mg         Qty: 4  LOT: [id number]  Exp.Date: 5/22  Dosing instructions: Take 1 tab daily  The patient has been instructed regarding the correct time, dose, and frequency of taking this medication, including desired effects and most common side effects.   [name] 3:20 PM 06/13/2019

## 2019-06-13 NOTE — Patient Instructions (Signed)
Increase Torsemide to 80 mg in AM and 40 mg (2 tabs) in PM FOR 2 DAYS ONLY, then back to 80 mg daily  Stop Losartan  Start Entresto 24/26 mg Twice daily   Labs done today, we will notify you of abnormal results  Labs in 2 weeks  Please follow up with our heart failure pharmacist in 3-4 weeks  Your physician recommends that you schedule a follow-up appointment in: 6 weeks  If you have any questions or concerns before your next appointment please send [place] a message through [place] or call our office at [phone].  At the [facility], you and your health needs are our priority. As part of our continuing mission to provide you with exceptional heart care, we have created designated Provider Care Teams. These Care Teams include your primary Cardiologist (physician) and Advanced Practice Providers (APPs- Physician Assistants and Nurse Practitioners) who all work together to provide you with the care you need, when you need it.   You may see any of the following providers on your designated Care Team at your next follow up: Marland Kitchen Dr Glori Bickers . Dr [name] . Darrick Grinder, NP . Lyda Jester, PA   Please be sure to bring in all your medications bottles to every appointment.

## 2019-06-14 NOTE — Progress Notes (Signed)
PCP: Seward Carol, MD EP: Dr. Rayann Heman Cardiology: Dr. Gwenlyn Found HF Cardiology: Dr. Aundra Dubin  68 y.o. with history of chronic atrial fibrillation and flutter with failed Maze procedure, valvular heart disease s/p mitral and aortic valve repairs, and chronic systolic CHF was referred by Dr. Rayann Heman for CHF evaluation.  Patient has a long history of atrial fibrillation.  He was initially controlled by Tikosyn but it eventually became chronic.  He had significant mitral and aortic regurgitation, and in 11/19, he had mitral and aortic valve repairs with Maze and LA appendage ligation. Cath prior to valve surgery in 10/19 showed no significant coronary disease.  It appears from ECGs that he never went back into NSR.  He has had slow atrial fibrillation with rate in upper 30s-40s at times, so was never started on amiodarone.  Last echo in 4/20 showed EF 40-45% with stable aortic and mitral valve repairs.  He has developed worsening volume overload, and Lasix has been titrated up to 80 mg bid.  Most recently, he went into atypical atrial flutter with HR consistently in the 110s range.  He therefore was taken for DCCV 03/10/19.  Initially rhythm looked junctional in the 40s-50s, but he subsequently went back into rapid atypical atrial flutter with elevated rate (>100 bpm).   Echo was done in 9/20, showing EF worse at 20-25%.     He saw Dr. Rayann Heman and had atrial flutter + atrial fibrillation ablation in 10/20.  Post-procedure, he was noted to be in slow atrial fibrillation (rate 40s) at time of his atrial fibrillation clinic followup.   Patient returns for followup of CHF.   Weight is up by about 10 lbs since my last appointment with him.  He says that he is feeling better since atrial fibrillation ablation, though he is in atrial fibrillation in the 50s today.  At home, HR at rest is in the upper 30s-40s often. He denies lightheadedness or syncope. He is no longer in rapid atrial flutter.  He is short of breath walking  up a hill but denies dyspnea on flat ground.  He has been able to do projects around the house and yard as long as he paces himself. No chest pain.  No orthopnea/PND.  He is using CPAP.   ECG (personally reviewed): Atrial fibrillation with LBBB QRS 180 msec, rate 64  Labs (8/20): K 3.9, creatinine 1.14, BNP 473, hgb 13.3 Labs (9/20): K 3.3 => 3.7, creatinine 1.35 => 1.3  PMH: 1. HTN 2. Chronic atrial fibrillation/flutter: Failed Tikosyn.  Had Maze procedure 11/19 but does not appear to have ever gone back into NSR.  Had been in atrial fibrillation with bradycardia with rate in 30s-40s at times, now in atypical atrial flutter primarily with rate 100s-110s.  He failed DCCV 8/20.  No amiodarone due to h/o bradycardia.  - Atrial fibrillation + atrial flutter ablation in 10/20.  3. OSA: Uses CPAP.  4. Chronic systolic CHF: Nonischemic cardiomyopathy.  - 10/19 LHC: No coronary disease.  - Echo (4/20): EF 40-45%, mild LVH, moderate dilated RV with mildly decreased systolic function, s/p MV repair with no significant stenosis or regurgitation, s/p aortic valve repair with mild AI, mild aortic stenosis.  Dilated IVC.  - Echo (9/20): EF 20-25%, mild LVH with moderate LV dilation, s/p MV repair with mean gradient 6, s/p AoV repair with mean gradient 8. No significant MR or AI.  5. Valvular heart disease: Patient has history of MR and AI.  In 11/19, he had mitral valve repair,  aortic valve repair, surgical Maze, and LA appendage excision.  6. LBBB  Social History   Socioeconomic History  . Marital status: Married    Spouse name: Not on file  . Number of children: Not on file  . Years of education: Not on file  . Highest education level: Not on file  Occupational History  . Occupation: Agricultural consultant  Social Needs  . Financial resource strain: Not on file  . Food insecurity    Worry: Not on file    Inability: Not on file  . Transportation needs    Medical: Not on file     Non-medical: Not on file  Tobacco Use  . Smoking status: Never Smoker  . Smokeless tobacco: Never Used  Substance and Sexual Activity  . Alcohol use: No  . Drug use: No  . Sexual activity: Yes  Lifestyle  . Physical activity    Days per week: Not on file    Minutes per session: Not on file  . Stress: Not on file  Relationships  . Social Herbalist on phone: Not on file    Gets together: Not on file    Attends religious service: Not on file    Active member of club or organization: Not on file    Attends meetings of clubs or organizations: Not on file    Relationship status: Not on file  . Intimate partner violence    Fear of current or ex partner: Not on file    Emotionally abused: Not on file    Physically abused: Not on file    Forced sexual activity: Not on file  Other Topics Concern  . Not on file  Social History Narrative   Pt lives in [place] with spouse.  Leadership training and behavioral safety training.   Family History  Problem Relation Age of Onset  . Other Other        mother had a pacemaker  . Hypertension Father    ROS: All systems reviewed and negative except as per HPI.   Current Outpatient Medications  Medication Sig Dispense Refill  . clobetasol ointment (TEMOVATE) [id number] % Apply 1 application topically daily as needed (rash).    Marland Kitchen KLOR-CON M20 20 MEQ tablet Take 1 tablet (20 mEq total) by mouth 2 (two) times daily. 60 tablet 11  . Multiple Vitamin (MULTIVITAMIN WITH MINERALS) TABS tablet Take 1 tablet by mouth 2 (two) times a week.    . Naproxen Sod-diphenhydrAMINE (ALEVE PM) 220-25 MG TABS Take 2 tablets by mouth at bedtime as needed (sleep/pain).    . Omega-3 Fatty Acids (FISH OIL PO) Take 1 capsule by mouth 4 (four) times a week.     Marland Kitchen OVER THE COUNTER MEDICATION Apply 1 spray topically daily as needed (cramps). Topical Magnesium Spray    . rivaroxaban (XARELTO) 20 MG TABS tablet Take 1 tablet (20 mg total) by mouth daily. 30 tablet  5  . spironolactone (ALDACTONE) 25 MG tablet Take 25 mg by mouth daily.    Marland Kitchen torsemide (DEMADEX) 20 MG tablet Take 4 tablets (80 mg total) by mouth daily. 120 tablet 6  . sacubitril-valsartan (ENTRESTO) 24-26 MG Take 1 tablet by mouth 2 (two) times daily. 60 tablet 3   No current facility-administered medications for this encounter.    BP (!) 125/56   Pulse (!) 53   Wt 118.9 kg (262 lb 3.2 oz)   SpO2 96%   BMI 37.62 kg/m  General:  NAD Neck: JVP 8-9 cm, no thyromegaly or thyroid nodule.  Lungs: Clear to auscultation bilaterally with normal respiratory effort. CV: Nondisplaced PMI.  Heart regular S1/S2, no S3/S4, 2/6 SEM RUSB.  1+ ankle edema.  No carotid bruit.  Normal pedal pulses.  Abdomen: Soft, nontender, no hepatosplenomegaly, no distention.  Skin: Intact without lesions or rashes.  Neurologic: Alert and oriented x 3.  Psych: Normal affect. Extremities: No clubbing or cyanosis.  HEENT: Normal.   Assessment/Plan: 1. Chronic systolic CHF: Echo in [id number] with EF 40-45%, moderate RV dilation/mildly decreased RV function.  Nonischemic cardiomyopathy, cath in 10/19 without significant coronary disease.  Repeat echo in 9/20 showed fall in EF to 20-25% in the setting of atrial fibrillation and atrial flutter with persistent tachycardia.  He is still in atrial fibrillation today but heart rate is slower (ranges upper 30s - 50s generally). NYHA class II symptoms but weight is up and he has mild volume overload on exam.  - Increase torsemide to 80 qam/40 qpm x 2 days then back to 80 mg daily.   BMET today.  - Continue spironolactone 25 mg daily.   - Stop losartan, start Entresto 24/26 bid.  This will likely give some additional diuresis.  BMET in 10 days.   - Avoid beta blocker with history of slow atrial fibrillation versus junctional bradycardia.   - Repeat echo in around 3 months hopefully while in NSR.  If EF remains low, he will be a good candidate for CRT-D.  He may be able to avoid AV  nodal ablation now that he is not in a rapid atrial arrhythmia.  2. Atrial arrhythmias: Long history of chronic atrial fibrillation, has had slow ventricular response in the past (HR 40s at times). He failed Tikosyn and have avoided amiodarone with bradycardia.  He had Maze procedure in 11/19 but it was not successful.  At initial appointment with me, he was in atypical flutter with HR 110s.  He failed DCCV in 8/20.  He had flutter/fibrillation ablation in 10/20.  Since then, he appears to have been in a slow atrial fibrillation (rate upper 30s-50s generally) but out of rapid atrial flutter. I suspect that he is not going to come out of atrial fibrillation and we do not have a good anti-arrhythmic option for him.  - As above, may need CRT-D device down the road. With resolution of rapid atrial arrhythmias, may be able to avoid AV nodal ablation.   - He will continue Xarelto.   3. OSA: Continue CPAP.  4. Valvular heart disease: S/p MV and AoV repairs in 11/19.  Valves looked stable on last echo in 9/20.  - He will need antibiotic prophylaxis with dental work.  5. Bradycardia: HR gets down to upper 30s-40s at home when at rest.  He is not symptomatic.  - Avoid nodal blockers.   Followup with HF pharmacist in 3 wks and me in 6 wks.    [name] 06/14/2019

## 2019-06-17 ENCOUNTER — Telehealth (HOSPITAL_COMMUNITY): Payer: Self-pay

## 2019-06-17 NOTE — Telephone Encounter (Signed)
-----   Message from Larey Dresser, MD sent at 06/17/2019 12:16 PM EST ----- I talked with Mr Griego.  HR in 90s-100s, may be flutter or may just be fibrillation going faster.  He feels fine.  He will come in Monday for ECG and afib clinic appt.

## 2019-06-17 NOTE — Telephone Encounter (Signed)
Pt aware of appt time with Afib clinic. Verbalized appreciation.

## 2019-06-17 NOTE — Telephone Encounter (Signed)
Pt called stating that his HR is 115-119 since starting Entresto on Tuesday.  He denies any symptoms associated with elevated HR. No cp, sob or palplitations. Pt reports only thing he experienced on Wednesday night was woke up sweating. His baseline HR is 30's-40s'.  Advised he could be back in aflutter as this happened before.  Pt thinks its related to starting ENtresto.  Spoke with [name], advised is not result of Entresto and he appears to be back in aflutter. Pt made aware of conversation with MD, he did not want to speak with me anymore and demanded to speak with MD.  Per MD, patient needs EKG and an appt with afib clinic. Before I could offer nursing visit for EKG, pt demanded to speak with MD. Message sent to MD. Will get update from MD to bring patient in for EKG

## 2019-06-20 ENCOUNTER — Ambulatory Visit (HOSPITAL_COMMUNITY)
Admission: RE | Admit: 2019-06-20 | Discharge: 2019-06-20 | Disposition: A | Discharge status: Home / Self Care | Payer: Medicare HMO | Source: Ambulatory Visit | Attending: Physician Assistant | Admitting: Physician Assistant

## 2019-06-20 ENCOUNTER — Encounter (HOSPITAL_COMMUNITY): Payer: Self-pay | Admitting: Physician Assistant

## 2019-06-20 ENCOUNTER — Encounter: Payer: Medicare HMO | Admitting: Thoracic Surgery (Cardiothoracic Vascular Surgery)

## 2019-06-20 ENCOUNTER — Other Ambulatory Visit: Payer: Self-pay

## 2019-06-20 VITALS — BP 100/72 | HR 112 | Ht 70.0 in | Wt 258.6 lb

## 2019-06-20 DIAGNOSIS — Z7901 Long term (current) use of anticoagulants: Secondary | ICD-10-CM | POA: Diagnosis not present

## 2019-06-20 DIAGNOSIS — I484 Atypical atrial flutter: Secondary | ICD-10-CM | POA: Diagnosis not present

## 2019-06-20 DIAGNOSIS — D6869 Other thrombophilia: Secondary | ICD-10-CM | POA: Diagnosis not present

## 2019-06-20 DIAGNOSIS — Z96651 Presence of right artificial knee joint: Secondary | ICD-10-CM | POA: Insufficient documentation

## 2019-06-20 DIAGNOSIS — I5022 Chronic systolic (congestive) heart failure: Secondary | ICD-10-CM | POA: Diagnosis not present

## 2019-06-20 DIAGNOSIS — I4892 Unspecified atrial flutter: Secondary | ICD-10-CM | POA: Insufficient documentation

## 2019-06-20 DIAGNOSIS — Z79899 Other long term (current) drug therapy: Secondary | ICD-10-CM | POA: Diagnosis not present

## 2019-06-20 DIAGNOSIS — I110 Hypertensive heart disease with heart failure: Secondary | ICD-10-CM | POA: Diagnosis not present

## 2019-06-20 DIAGNOSIS — Z8249 Family history of ischemic heart disease and other diseases of the circulatory system: Secondary | ICD-10-CM | POA: Insufficient documentation

## 2019-06-20 DIAGNOSIS — Z6837 Body mass index (BMI) 37.0-37.9, adult: Secondary | ICD-10-CM | POA: Insufficient documentation

## 2019-06-20 DIAGNOSIS — E669 Obesity, unspecified: Secondary | ICD-10-CM | POA: Diagnosis not present

## 2019-06-20 DIAGNOSIS — I4819 Other persistent atrial fibrillation: Secondary | ICD-10-CM | POA: Insufficient documentation

## 2019-06-20 DIAGNOSIS — G4733 Obstructive sleep apnea (adult) (pediatric): Secondary | ICD-10-CM | POA: Diagnosis not present

## 2019-06-20 DIAGNOSIS — Z96642 Presence of left artificial hip joint: Secondary | ICD-10-CM | POA: Diagnosis not present

## 2019-06-20 DIAGNOSIS — Z888 Allergy status to other drugs, medicaments and biological substances status: Secondary | ICD-10-CM | POA: Insufficient documentation

## 2019-06-20 DIAGNOSIS — I4891 Unspecified atrial fibrillation: Secondary | ICD-10-CM | POA: Diagnosis present

## 2019-06-20 DIAGNOSIS — Z885 Allergy status to narcotic agent status: Secondary | ICD-10-CM | POA: Diagnosis not present

## 2019-06-20 NOTE — Progress Notes (Signed)
Primary Care Physician: Seward Carol, MD Primary Cardiologist: Dr Gwenlyn Found Primary Electrophysiologist: Dr Rayann Heman Referring Physician: Dr Rayann Heman [facility]: Dr Cassandria Santee is a 68 y.o. male with a history of chronic combined systolic and diastolic heart failure, persistent atrial fibrillation, chronic anticoagulation, hypertensive cardiovascular disease, mitral regurgitation, OSA on C-pap, and obesity. He underwent AVR, mitral valve repair, maze procedure, and LAAexcisionon 05/18/18 with postoperative afib. He was initially on amiodarone but this was stopped 2/2 bradycardia. In 02/2019, he was found to have atrial tachycardia vs atypical atrial flutter with elevated V rates. He underwent DCCV on 03/10/19. Unfortunately, about two days later patient felt his heart rate increase and he has more dyspnea with exertion. He is on Xarelto for a CHADS2VASC score of 2. Patient is s/p afib ablation with Dr Rayann Heman on 04/26/19.   On follow up today, patient reports that he felt well post ablation although he was in rate controlled afib. Patient reports that a few days after starting Entresto, his heart rates became elevated again 90s-100s. He is asymptomatic. No symptoms of fluid overload.   Today, he denies symptoms of palpitations, chest pain, shortness of breath, orthopnea, PND, dizziness, presyncope, syncope, snoring, daytime somnolence, bleeding, or neurologic sequela. The patient is tolerating medications without difficulties and is otherwise without complaint today.    Atrial Fibrillation Risk Factors:  he does have symptoms or diagnosis of sleep apnea. he is compliant with CPAP therapy.  he has a BMI of Body mass index is 37.11 kg/m.Marland Kitchen Filed Weights   06/20/19 0910  Weight: 117.3 kg    Family History  Problem Relation Age of Onset  . Other Other        mother had a pacemaker  . Hypertension Father      Atrial Fibrillation Management history:  Previous antiarrhythmic drugs:  amiodarone Previous cardioversions: 11/30/18, 03/10/19 Previous ablations: MAZE 2019, 04/26/19 CHADS2VASC score: 2 Anticoagulation history: Xarelto    Past Medical History:  Diagnosis Date  . Aortic insufficiency 04/21/2018  . Aortic insufficiency   . Arthritis    "joints" (09/26/2013)  . Atrial enlargement, left   . Dyspnea    with exertion  . Hearing aid worn    both ears  . Hypertension   . Mitral regurgitation 04/21/2018  . Obesity   . OSA on CPAP    "mask adjusts to what I need" (09/26/2013)  . Persistent atrial fibrillation ([place])    cardioversion 06/06/13  . PONV (postoperative nausea and vomiting)   . Rotator cuff tear, right dec 2012   physical therapy done, decreased strength  . S/P aortic valve repair 05/18/2018   Complex valvuloplasty including plication of right coronary leaflet and 21 mm Biostable HAART annuloplasty ring  . S/P Maze operation for atrial fibrillation 05/18/2018   Complete bilateral atrial lesion set using bipolar radiofrequency and cryothermy ablation with clipping of LA appendage  . S/P mitral valve repair 05/18/2018   Complex valvuloplasty including artificial Gore-tex neochord placement x4 and 30 mm Sorin Memo 4D ring annuloplasty  . Wears glasses    Past Surgical History:  Procedure Laterality Date  . AORTIC VALVE REPAIR N/A 05/18/2018   Procedure: AORTIC VALVE REPAIR using HAART 300 Aortic Annuloplasty Device size 30mm;  Surgeon: Rexene Alberts, MD;  Location: [place];  Service: Open Heart Surgery;  Laterality: N/A;  . ATRIAL FIBRILLATION ABLATION N/A 04/26/2019   Procedure: ATRIAL FIBRILLATION ABLATION;  Surgeon: Thompson Grayer, MD;  Location: [place] [facility] LAB;  Service:  Cardiovascular;  Laterality: N/A;  . CARDIOVERSION N/A 06/06/2013   Procedure: CARDIOVERSION;  Surgeon: Sanda Klein, MD;  Location: [facility] ENDOSCOPY;  Service: Cardiovascular;  Laterality: N/A;  . CARDIOVERSION N/A 09/28/2013   Procedure: CARDIOVERSION BEDSIDE;  Surgeon: Peter M  Martinique, MD;  Location: [place];  Service: Cardiovascular;  Laterality: N/A;  . CARDIOVERSION N/A 11/30/2018   Procedure: CARDIOVERSION;  Surgeon: Dorothy Spark, MD;  Location: [facility] ENDOSCOPY;  Service: Cardiovascular;  Laterality: N/A;  . CARDIOVERSION N/A 03/10/2019   Procedure: CARDIOVERSION;  Surgeon: [name], MD;  Location: [facility];  Service: Endoscopy;  Laterality: N/A;  . CARPAL TUNNEL RELEASE Right 08/08/2013   Procedure: RIGHT CARPAL TUNNEL RELEASE;  Surgeon: Wynonia Sours, MD;  Location: [place];  Service: Orthopedics;  Laterality: Right;  . CARPAL TUNNEL RELEASE Left 2008  . CLIPPING OF ATRIAL APPENDAGE  05/18/2018   Procedure: CLIPPING OF ATRIAL APPENDAGE using AtriCure Clip size 45;  Surgeon: Rexene Alberts, MD;  Location: [place];  Service: Open Heart Surgery;;  . KNEE ARTHROSCOPY Right 1990's  . MAZE N/A 05/18/2018   Procedure: MAZE;  Surgeon: Rexene Alberts, MD;  Location: [place];  Service: Open Heart Surgery;  Laterality: N/A;  . MITRAL VALVE REPAIR N/A 05/18/2018   Procedure: MITRAL VALVE REPAIR (MVR) using 4D Memo Ring size 30;  Surgeon: Rexene Alberts, MD;  Location: [place];  Service: Open Heart Surgery;  Laterality: N/A;  . RIGHT/LEFT HEART CATH AND CORONARY ANGIOGRAPHY N/A 04/21/2018   Procedure: RIGHT/LEFT HEART CATH AND CORONARY ANGIOGRAPHY;  Surgeon: Nelva Bush, MD;  Location: [place] CV LAB;  Service: Cardiovascular;  Laterality: N/A;  . SHOULDER OPEN ROTATOR CUFF REPAIR Left 1990's  . SHOULDER OPEN ROTATOR CUFF REPAIR Right 11/2007  . TEE WITHOUT CARDIOVERSION N/A 04/21/2018   Procedure: TRANSESOPHAGEAL ECHOCARDIOGRAM (TEE);  Surgeon: Jerline Pain, MD;  Location: [facility] ENDOSCOPY;  Service: Cardiovascular;  Laterality: N/A;  . TEE WITHOUT CARDIOVERSION N/A 05/18/2018   Procedure: TRANSESOPHAGEAL ECHOCARDIOGRAM (TEE);  Surgeon: Rexene Alberts, MD;  Location: [place];  Service: Open Heart Surgery;  Laterality: N/A;  . TONSILLECTOMY   1950's  . TOTAL HIP ARTHROPLASTY Left 2010  . TOTAL HIP REVISION Left 10/08/2011  . TOTAL HIP REVISION  04/09/2012   Procedure: TOTAL HIP REVISION;  Surgeon: Gearlean Alf, MD;  Location: [facility];  Service: Orthopedics;  Laterality: Left;  Left Hip Acetabular Revision vs Constrained Liner  . TOTAL KNEE ARTHROPLASTY Right 2006   . TRIGGER FINGER RELEASE Right 08/08/2013   Procedure: RELEASE STENOSING TENOSYNOVITIS RIGHT THUMB;  Surgeon: Wynonia Sours, MD;  Location: [place];  Service: Orthopedics;  Laterality: Right;    Current Outpatient Medications  Medication Sig Dispense Refill  . clobetasol ointment (TEMOVATE) [id number] % Apply 1 application topically daily as needed (rash).    Marland Kitchen KLOR-CON M20 20 MEQ tablet Take 1 tablet (20 mEq total) by mouth 2 (two) times daily. 60 tablet 11  . Multiple Vitamin (MULTIVITAMIN WITH MINERALS) TABS tablet Take 1 tablet by mouth 2 (two) times a week.    . Naproxen Sod-diphenhydrAMINE (ALEVE PM) 220-25 MG TABS Take 2 tablets by mouth at bedtime as needed (sleep/pain).    . Omega-3 Fatty Acids (FISH OIL PO) Take 1 capsule by mouth 4 (four) times a week.     Marland Kitchen OVER THE COUNTER MEDICATION Apply 1 spray topically daily as needed (cramps). Topical Magnesium Spray    . rivaroxaban (XARELTO) 20 MG  TABS tablet Take 1 tablet (20 mg total) by mouth daily. 30 tablet 5  . sacubitril-valsartan (ENTRESTO) 24-26 MG Take 1 tablet by mouth 2 (two) times daily. 60 tablet 3  . spironolactone (ALDACTONE) 25 MG tablet Take 25 mg by mouth daily.    Marland Kitchen torsemide (DEMADEX) 20 MG tablet Take 4 tablets (80 mg total) by mouth daily. 120 tablet 6   No current facility-administered medications for this encounter.     Allergies  Allergen Reactions  . Metoprolol Tartrate Shortness Of Breath    Shortness of breath, pressure in chest and back  . Oxycodone Rash and Other (See Comments)    Can tolerate Hydrocodone    Social History   Socioeconomic History  . Marital  status: Married    Spouse name: Not on file  . Number of children: Not on file  . Years of education: Not on file  . Highest education level: Not on file  Occupational History  . Occupation: Agricultural consultant  Social Needs  . Financial resource strain: Not on file  . Food insecurity    Worry: Not on file    Inability: Not on file  . Transportation needs    Medical: Not on file    Non-medical: Not on file  Tobacco Use  . Smoking status: Never Smoker  . Smokeless tobacco: Never Used  Substance and Sexual Activity  . Alcohol use: No  . Drug use: No  . Sexual activity: Yes  Lifestyle  . Physical activity    Days per week: Not on file    Minutes per session: Not on file  . Stress: Not on file  Relationships  . Social Herbalist on phone: Not on file    Gets together: Not on file    Attends religious service: Not on file    Active member of club or organization: Not on file    Attends meetings of clubs or organizations: Not on file    Relationship status: Not on file  . Intimate partner violence    Fear of current or ex partner: Not on file    Emotionally abused: Not on file    Physically abused: Not on file    Forced sexual activity: Not on file  Other Topics Concern  . Not on file  Social History Narrative   Pt lives in [place] with spouse.  Leadership training and behavioral safety training.     ROS- All systems are reviewed and negative except as per the HPI above.  Physical Exam: Vitals:   06/20/19 0910  BP: 100/72  Pulse: (!) 112  Weight: 117.3 kg  Height: 5\' 10"  (1.778 m)    GEN- The patient is well appearing obese male, alert and oriented x 3 today.   HEENT-head normocephalic, atraumatic, sclera clear, conjunctiva pink, hearing intact, trachea midline. Lungs- Clear to ausculation bilaterally, normal work of breathing Heart- irregular rate and rhythm, no murmurs, rubs or gallops  GI- soft, NT, ND, + BS Extremities- no  clubbing, cyanosis, or edema MS- no significant deformity or atrophy Skin- no rash or lesion Psych- euthymic mood, full affect Neuro- strength and sensation are intact   Wt Readings from Last 3 Encounters:  06/20/19 117.3 kg  06/13/19 118.9 kg  05/25/19 120.4 kg    EKG today demonstrates atypical atrial flutter HR 112, LBBB, QRS 150, QTc 526  Echo 11/01/18 demonstrated   1. The left ventricle has mild-moderately reduced systolic function, with an ejection  fraction of 40-45%. The cavity size was normal. There is mild concentric left ventricular hypertrophy. Left ventricular diastolic function could not be evaluated due  to mitral valve replacement/repair. Left ventrical global hypokinesis without regional wall motion abnormalities.  2. The right ventricle has mildly reduced systolic function. The cavity was moderately enlarged. There is no increase in right ventricular wall thickness. Right ventricular systolic pressure is moderately elevated with an estimated pressure of 62 mmHg.  3. A 30 mm Sorin Memo 4D Ring valve is present in the mitral position. Procedure Date: 05/18/18.  4. Mild thickening of the mitral valve leaflet. No evidence of true mitral valve stenosis.  5. A 44mm Biostable HART Ring valve is present in the aortic position. Procedure Date: 05-18-18.  6. The aortic valve is tricuspid. Mild thickening of the aortic valve. Aortic valve regurgitation is mild by color flow Doppler. The jet is eccentric medially directed. Mild stenosis of the aortic valve.  7. The inferior vena cava was dilated in size with <50% respiratory variability.  8. Left atrial size was severely dilated.  9. Right atrial size was severely dilated. 10. Compared to 05/24/2018 (early postop) report, left ventricular systolic function has decreased, but is similar when compared to all other previous echocardiograms  Epic records are reviewed at length today  Assessment and Plan:  1. Persistent atrial  fibrillation/atrial tachycardia/atrial flutter S/p MAZE 2019. S/p DCCV 03/10/19. Unfortunately had quick return to atrial flutter.  AAD limited by h/o bradycardia and junctional rhythm.  S/p afib ablation 04/26/19 with Dr Rayann Heman. He is now back in atrial flutter. We discussed therapeutic options today. He declined DCCV. We discussed AV node ablation and CRT-D. He will discuss again with Dr Rayann Heman at upcoming visit.  Continue Xarelto 20 mg daily with no missed doses for at least 3 months post ablation.   This patients CHA2DS2-VASc Score and unadjusted Ischemic Stroke Rate (% per year) is equal to 2.2 % stroke rate/year from a score of 2  Above score calculated as 1 point each if present [CHF, HTN, DM, Vascular=MI/PAD/Aortic Plaque, Age if 65-74, or Male] Above score calculated as 2 points each if present [Age > 75, or Stroke/TIA/TE]   2. Obesity Body mass index is 37.11 kg/m. Lifestyle modification was discussed and encouraged including regular physical activity and weight reduction.  3. Obstructive sleep apnea The importance of adequate treatment of sleep apnea was discussed today in order to improve our ability to maintain sinus rhythm long term. Patient reports compliance with CPAP therapy.  4. HTN Stable, no changes today.  5. Chronic systolic CHF Recent echo showed EF 20-25% in the setting of rapid afib.  Weight stable, no signs or symptoms of fluid overload.  6. Valvular disease S/p MVR and AVR.   Follow up with Dr Rayann Heman as scheduled.   [facility] [address] [place], [place] [address] [phone] 06/20/2019 9:34 AM

## 2019-06-28 ENCOUNTER — Other Ambulatory Visit: Payer: Self-pay

## 2019-06-28 ENCOUNTER — Ambulatory Visit (HOSPITAL_COMMUNITY)
Admission: RE | Admit: 2019-06-28 | Discharge: 2019-06-28 | Disposition: A | Discharge status: Home / Self Care | Payer: Medicare HMO | Source: Ambulatory Visit | Attending: Cardiology | Admitting: Cardiology

## 2019-06-28 DIAGNOSIS — I5022 Chronic systolic (congestive) heart failure: Secondary | ICD-10-CM | POA: Diagnosis not present

## 2019-06-28 LAB — BASIC METABOLIC PANEL
Anion gap: 9 (ref 5–15)
BUN: 31 mg/dL — ABNORMAL HIGH (ref 8–23)
CO2: 27 mmol/L (ref 22–32)
Calcium: 9.3 mg/dL (ref 8.9–10.3)
Chloride: 104 mmol/L (ref 98–111)
Creatinine, Ser: 1.2 mg/dL (ref 0.61–1.24)
GFR calc Af Amer: >60 mL/min (ref >60)
GFR calc non Af Amer: >60 mL/min (ref >60)
Glucose, Bld: 110 mg/dL — ABNORMAL HIGH (ref 70–99)
Potassium: 4.1 mmol/L (ref 3.5–5.1)
Sodium: 140 mmol/L (ref 135–145)

## 2019-07-01 NOTE — Progress Notes (Signed)
Primary Care Physician: Seward Carol, MD Primary Cardiologist: Dr Gwenlyn Found Primary Electrophysiologist: Dr Rayann Heman Referring Physician: Dr Rayann Heman [place]: [name]  HPI:  68 y.o. with history of chronic atrial fibrillation and flutter with failed Maze procedure, valvular heart disease s/p mitral and aortic valve repairs, and chronic systolic CHF was referred by Dr. Rayann Heman for CHF evaluation.  Patient has a long history of atrial fibrillation.  He was initially controlled by Tikosyn but it eventually became chronic.  He had significant mitral and aortic regurgitation, and in 11/19, he had mitral and aortic valve repairs with Maze and LA appendage ligation. Cath prior to valve surgery in 10/19 showed no significant coronary disease.  It appears from ECGs that he never went back into NSR.  He has had slow atrial fibrillation with rate in upper 30s-40s at times, so was never started on amiodarone.  Last echo in 4/20 showed EF 40-45% with stable aortic and mitral valve repairs.  He has developed worsening volume overload, and Lasix has been titrated up to 80 mg bid.  Most recently, he went into atypical atrial flutter with HR consistently in the 110s range.  He therefore was taken for DCCV 03/10/19.  Initially rhythm looked junctional in the 40s-50s, but he subsequently went back into rapid atypical atrial flutter with elevated rate (>100 bpm).   Echo was done in 9/20, showing EF worse at 20-25%.     He saw Dr. Rayann Heman and had atrial flutter + atrial fibrillation ablation in 10/20.  Post-procedure, he was noted to be in slow atrial fibrillation (rate 40s) at time of his atrial fibrillation clinic followup.   Patient recently returned for HF Clinic on 06/13/19 with Dr. Aundra Dubin. At that visit, his weight was up by about 10 lbs since his last appointment.  He stated that he was feeling better since atrial fibrillation ablation, though he was in atrial fibrillation in the 50s on EKG in clinic.  At home, HR at rest  is in the upper 30s-40s often. He denied lightheadedness or syncope. He is no longer in rapid atrial flutter.  He reported getting short of breath walking up a hill but denied dyspnea on flat ground.  He has been able to do projects around the house and yard as long as he paces himself. No chest pain.  No orthopnea/PND.  He was using CPAP.  Today he returns to HF clinic for pharmacist medication titration. At last visit with MD, Losartan was discontinued and Entresto 24/26 mg BID was initiated. His torsemide was increased to 80 mg AM/40 mg PM x2 days, then 80 mg daily was restarted. Overall he is feeling good today because he is sleeping much better. Uses CPAP every night. No dizziness, lightheadedness, chest pain or palpitations. No SOB/DOE. Has been quite active recently, working outside in his yard. He weighs himself daily, morning and night. His weight normally ranges from 258-264 lbs. He was 260 lbs this morning (265 lbs on clinic scale). He takes torsemide 80 mg daily, and takes an extra tablet approximately 2 times per week when he gains >3 lbs overnight. No LEE, PND or orthopnea. His appetite has been good. He follows a low salt-diet and appropriately fluid restricts. Compliant with all medications.   . Shortness of breath/dyspnea on exertion? no  . Orthopnea/PND? no . Edema? no . Lightheadedness/dizziness? no . Daily weights at home? yes . Blood pressure/heart rate monitoring at home? yes . Following low-sodium/fluid-restricted diet? yes  HF Medications: Entresto 24/26 mg BID Spironolactone 25  mg daily Torsemide 80 mg daily Potassium chloride 20 mEq BID  Has the patient been experiencing any side effects to the medications prescribed?  no  Does the patient have any problems obtaining medications due to transportation or finances?   Yes - has Medicare part D, but is currently in the coverage gap. Both his Xarelto and new ENtresto have high copays. Will apply for [facility] patient  assistance today as he reports he will have a deductible in January. Have also added him to the [employer] heart failure fund waitlist in case he is denied for manufacturer patient assistance.   Understanding of regimen: good Understanding of indications: good Potential of compliance: good Patient understands to avoid NSAIDs. Patient understands to avoid decongestants.    Pertinent Lab Values (06/28/19): Marland Kitchen Serum creatinine 1.20, BUN 31, Potassium 4.1, Sodium 140  Vital Signs: . Weight: 265 lbs (260 lbs on home scale) . Blood pressure: 116/78  . Heart rate: 111   Assessment: 1. Chronic systolic CHF: Echo in [id number] with EF 40-45%, moderate RV dilation/mildly decreased RV function.  Nonischemic cardiomyopathy, cath in 10/19 without significant coronary disease.  Repeat echo in 9/20 showed fall in EF to 20-25% in the setting of atrial fibrillation and atrial flutter with persistent tachycardia.  He is still in atrial fibrillation today but heart rate is slower (ranges upper 30s - 50s generally).  -NYHA class II symptoms, euvolemic on exam.   - Continue torsemide 80 mg daily.  - Avoid beta blocker with history of slow atrial fibrillation versus junctional bradycardia.   - Increase Entresto to 49/51 mg BID. Repeat BMET at next clinic visit. Gave patient samples today as his copay is ~100/month while in coverage gap. Will work on applying for [employer] assistance.    - Continue spironolactone 25 mg daily.   - Would be good candidate for SGLT2i in the future. Pharmacy benefits investigation: Farxiga $83.29; Jardiance $84.58 (while in coverage gap). Would wait to initiate therapy until out of coverage gap.  - Repeat echo in around 3 months hopefully while in NSR.  If EF remains low, he will be a good candidate for CRT-D.   2. Atrial arrhythmias: Long history of chronic atrial fibrillation, has had slow ventricular response in the past (HR 40s at times). He failed Tikosyn and have avoided amiodarone with  bradycardia.  He had Maze procedure in 11/19 but it was not successful.  At initial appointment with Dr. Aundra Dubin, he was in atypical flutter with HR 110s.  He failed DCCV in 8/20.  He had flutter/fibrillation ablation in 10/20.  Since then, he appears to have been in a slow atrial fibrillation (rate upper 30s-50s generally) but out of rapid atrial flutter. I suspect that he is not going to come out of atrial fibrillation and we do not have a good anti-arrhythmic option for him.  - As above, may need CRT-D device down the road. With resolution of rapid atrial arrhythmias, may be able to avoid AV nodal ablation.   - He will continue Xarelto.   3. OSA: Continue CPAP.  4. Valvular heart disease: S/p MV and AoV repairs in 11/19.  Valves looked stable on last echo in 9/20.  - He will need antibiotic prophylaxis with dental work.  5. Bradycardia: HR gets down to upper 30s-40s at home when at rest.  He is not symptomatic.  - Avoid nodal blockers.    Plan: 1) Medication changes: Based on clinical presentation, vital signs and recent labs will Increase Entresto to  49/51 mg BID.  2) Labs: Scr 1.20, K 4.1 3) Follow-up: HF Clinic with Dr. Aundra Dubin in 3 weeks.    Audry Riles, PharmD, BCPS, BCCP, CPP Heart Failure Clinic Pharmacist [phone]

## 2019-07-04 ENCOUNTER — Other Ambulatory Visit: Payer: Self-pay | Admitting: Cardiovascular Disease

## 2019-07-05 ENCOUNTER — Ambulatory Visit (HOSPITAL_COMMUNITY)
Admission: RE | Admit: 2019-07-05 | Discharge: 2019-07-05 | Disposition: A | Discharge status: Home / Self Care | Payer: Medicare HMO | Source: Ambulatory Visit | Attending: Internal Medicine | Admitting: Internal Medicine

## 2019-07-05 ENCOUNTER — Other Ambulatory Visit: Payer: Self-pay

## 2019-07-05 ENCOUNTER — Telehealth (HOSPITAL_COMMUNITY): Payer: Self-pay | Admitting: Pharmacist

## 2019-07-05 VITALS — BP 116/78 | HR 111 | Wt 265.0 lb

## 2019-07-05 DIAGNOSIS — Z79899 Other long term (current) drug therapy: Secondary | ICD-10-CM | POA: Insufficient documentation

## 2019-07-05 DIAGNOSIS — I482 Chronic atrial fibrillation, unspecified: Secondary | ICD-10-CM | POA: Insufficient documentation

## 2019-07-05 DIAGNOSIS — I428 Other cardiomyopathies: Secondary | ICD-10-CM | POA: Diagnosis not present

## 2019-07-05 DIAGNOSIS — G4733 Obstructive sleep apnea (adult) (pediatric): Secondary | ICD-10-CM | POA: Insufficient documentation

## 2019-07-05 DIAGNOSIS — Z7901 Long term (current) use of anticoagulants: Secondary | ICD-10-CM | POA: Diagnosis not present

## 2019-07-05 DIAGNOSIS — I5022 Chronic systolic (congestive) heart failure: Secondary | ICD-10-CM | POA: Insufficient documentation

## 2019-07-05 DIAGNOSIS — I499 Cardiac arrhythmia, unspecified: Secondary | ICD-10-CM | POA: Insufficient documentation

## 2019-07-05 DIAGNOSIS — I484 Atypical atrial flutter: Secondary | ICD-10-CM | POA: Diagnosis not present

## 2019-07-05 DIAGNOSIS — I080 Rheumatic disorders of both mitral and aortic valves: Secondary | ICD-10-CM | POA: Insufficient documentation

## 2019-07-05 DIAGNOSIS — I5032 Chronic diastolic (congestive) heart failure: Secondary | ICD-10-CM

## 2019-07-05 MED ORDER — SACUBITRIL-VALSARTAN 49-51 MG PO TABS: 1.0000 | ORAL_TABLET | Freq: Two times a day (BID) | ORAL | 11 refills | Status: DC

## 2019-07-05 NOTE — Telephone Encounter (Signed)
Sent in [employer] application to [employer] for [employer].   Phone:[phone] Fax: [phone]  Application pending, will continue to follow.  Audry Riles, PharmD, BCPS, BCCP, CPP Heart Failure Clinic Pharmacist [phone]

## 2019-07-05 NOTE — Patient Instructions (Signed)
It was a pleasure seeing you today!  MEDICATIONS: -We are changing your medications today -Increase Entresto to 49/51 mg (1 tab) twice daily. You may take 2 tablets twice daily of the 24/26 mg strength -Call if you have questions about your medications.  LABS: -We will call you if your labs need attention.  NEXT APPOINTMENT: Return to clinic in 3 weeks with Dr. Aundra Dubin.  In general, to take care of your heart failure: -Limit your fluid intake to 2 Liters (half-gallon) per day.   -Limit your salt intake to ideally 2-3 grams (2000-3000 mg) per day. -Weigh yourself daily and record, and bring that "weight diary" to your next appointment.  (Weight gain of 2-3 pounds in 1 day typically means fluid weight.) -The medications for your heart are to help your heart and help you live longer.   -Please contact us before stopping any of your heart medications.  Call the clinic at [phone] with questions or to reschedule future appointments.

## 2019-07-11 ENCOUNTER — Encounter: Payer: Self-pay | Admitting: Internal Medicine

## 2019-07-11 ENCOUNTER — Other Ambulatory Visit: Payer: Self-pay

## 2019-07-11 ENCOUNTER — Ambulatory Visit: Payer: Medicare HMO | Admitting: Internal Medicine

## 2019-07-11 VITALS — BP 102/64 | HR 109 | Ht 70.0 in | Wt 265.0 lb

## 2019-07-11 DIAGNOSIS — G4733 Obstructive sleep apnea (adult) (pediatric): Secondary | ICD-10-CM

## 2019-07-11 DIAGNOSIS — I484 Atypical atrial flutter: Secondary | ICD-10-CM | POA: Diagnosis not present

## 2019-07-11 DIAGNOSIS — Z8679 Personal history of other diseases of the circulatory system: Secondary | ICD-10-CM

## 2019-07-11 DIAGNOSIS — I5022 Chronic systolic (congestive) heart failure: Secondary | ICD-10-CM

## 2019-07-11 DIAGNOSIS — I4819 Other persistent atrial fibrillation: Secondary | ICD-10-CM | POA: Diagnosis not present

## 2019-07-11 DIAGNOSIS — I428 Other cardiomyopathies: Secondary | ICD-10-CM | POA: Diagnosis not present

## 2019-07-11 DIAGNOSIS — I447 Left bundle-branch block, unspecified: Secondary | ICD-10-CM

## 2019-07-11 DIAGNOSIS — Z9889 Other specified postprocedural states: Secondary | ICD-10-CM

## 2019-07-11 NOTE — Progress Notes (Signed)
PCP: Seward Carol, MD Primary Cardiologist: [name] Primary EP: Dr Rayann Heman  [name] is a 68 y.o. male who presents today for routine electrophysiology followup.  Since last being seen in our clinic, the patient reports doing reasonably well.  He is very active, working in his [employer] and around his house.  He states "I feel better when my heart rate is fast".  He has SOB and fatigue when in sinus bradycardia/ junctional rhythm.  Today, he denies symptoms of palpitations, chest pain, shortness of breath,  lower extremity edema, dizziness, presyncope, or syncope.  The patient is otherwise without complaint today.   Past Medical History:  Diagnosis Date  . Aortic insufficiency 04/21/2018  . Aortic insufficiency   . Arthritis    "joints" (09/26/2013)  . Atrial enlargement, left   . Dyspnea    with exertion  . Hearing aid worn    both ears  . Hypertension   . Mitral regurgitation 04/21/2018  . Obesity   . OSA on CPAP    "mask adjusts to what I need" (09/26/2013)  . Persistent atrial fibrillation ([place])    cardioversion 06/06/13  . PONV (postoperative nausea and vomiting)   . Rotator cuff tear, right dec 2012   physical therapy done, decreased strength  . S/P aortic valve repair 05/18/2018   Complex valvuloplasty including plication of right coronary leaflet and 21 mm Biostable HAART annuloplasty ring  . S/P Maze operation for atrial fibrillation 05/18/2018   Complete bilateral atrial lesion set using bipolar radiofrequency and cryothermy ablation with clipping of LA appendage  . S/P mitral valve repair 05/18/2018   Complex valvuloplasty including artificial Gore-tex neochord placement x4 and 30 mm Sorin Memo 4D ring annuloplasty  . Wears glasses    Past Surgical History:  Procedure Laterality Date  . AORTIC VALVE REPAIR N/A 05/18/2018   Procedure: AORTIC VALVE REPAIR using HAART 300 Aortic Annuloplasty Device size 45mm;  Surgeon: Rexene Alberts, MD;  Location: [facility];   Service: Open Heart Surgery;  Laterality: N/A;  . ATRIAL FIBRILLATION ABLATION N/A 04/26/2019   Procedure: ATRIAL FIBRILLATION ABLATION;  Surgeon: Thompson Grayer, MD;  Location: [place] [facility];  Service: Cardiovascular;  Laterality: N/A;  . CARDIOVERSION N/A 06/06/2013   Procedure: CARDIOVERSION;  Surgeon: Sanda Klein, MD;  Location: [facility];  Service: Cardiovascular;  Laterality: N/A;  . CARDIOVERSION N/A 09/28/2013   Procedure: CARDIOVERSION BEDSIDE;  Surgeon: Peter M Martinique, MD;  Location: [facility];  Service: Cardiovascular;  Laterality: N/A;  . CARDIOVERSION N/A 11/30/2018   Procedure: CARDIOVERSION;  Surgeon: Dorothy Spark, MD;  Location: [facility]COPY;  Service: Cardiovascular;  Laterality: N/A;  . CARDIOVERSION N/A 03/10/2019   Procedure: CARDIOVERSION;  Surgeon: [name], MD;  Location: [facility];  Service: Endoscopy;  Laterality: N/A;  . CARPAL TUNNEL RELEASE Right 08/08/2013   Procedure: RIGHT CARPAL TUNNEL RELEASE;  Surgeon: Wynonia Sours, MD;  Location: [facility];  Service: Orthopedics;  Laterality: Right;  . CARPAL TUNNEL RELEASE Left 2008  . CLIPPING OF ATRIAL APPENDAGE  05/18/2018   Procedure: CLIPPING OF ATRIAL APPENDAGE using AtriCure Clip size 45;  Surgeon: Rexene Alberts, MD;  Location: [facility];  Service: Open Heart Surgery;;  . KNEE ARTHROSCOPY Right 1990's  . MAZE N/A 05/18/2018   Procedure: MAZE;  Surgeon: Rexene Alberts, MD;  Location: [place];  Service: Open Heart Surgery;  Laterality: N/A;  . MITRAL VALVE REPAIR N/A 05/18/2018   Procedure: MITRAL VALVE  REPAIR (MVR) using 4D Memo Ring size 30;  Surgeon: Rexene Alberts, MD;  Location: [place];  Service: Open Heart Surgery;  Laterality: N/A;  . RIGHT/LEFT HEART CATH AND CORONARY ANGIOGRAPHY N/A 04/21/2018   Procedure: RIGHT/LEFT HEART CATH AND CORONARY ANGIOGRAPHY;  Surgeon: Nelva Bush, MD;  Location: [place] [facility];  Service: Cardiovascular;  Laterality: N/A;  . SHOULDER  OPEN ROTATOR CUFF REPAIR Left 1990's  . SHOULDER OPEN ROTATOR CUFF REPAIR Right 11/2007  . TEE WITHOUT CARDIOVERSION N/A 04/21/2018   Procedure: TRANSESOPHAGEAL ECHOCARDIOGRAM (TEE);  Surgeon: Jerline Pain, MD;  Location: [facility] ENDOSCOPY;  Service: Cardiovascular;  Laterality: N/A;  . TEE WITHOUT CARDIOVERSION N/A 05/18/2018   Procedure: TRANSESOPHAGEAL ECHOCARDIOGRAM (TEE);  Surgeon: Rexene Alberts, MD;  Location: [place];  Service: Open Heart Surgery;  Laterality: N/A;  . TONSILLECTOMY  1950's  . TOTAL HIP ARTHROPLASTY Left 2010  . TOTAL HIP REVISION Left 10/08/2011  . TOTAL HIP REVISION  04/09/2012   Procedure: TOTAL HIP REVISION;  Surgeon: Gearlean Alf, MD;  Location: [facility];  Service: Orthopedics;  Laterality: Left;  Left Hip Acetabular Revision vs Constrained Liner  . TOTAL KNEE ARTHROPLASTY Right 2006   . TRIGGER FINGER RELEASE Right 08/08/2013   Procedure: RELEASE STENOSING TENOSYNOVITIS RIGHT THUMB;  Surgeon: Wynonia Sours, MD;  Location: [facility];  Service: Orthopedics;  Laterality: Right;    ROS- all systems are reviewed and negatives except as per HPI above  Current Outpatient Medications  Medication Sig Dispense Refill  . clobetasol ointment (TEMOVATE) [id number] % Apply 1 application topically daily as needed (rash).    Marland Kitchen KLOR-CON M20 20 MEQ tablet Take 1 tablet (20 mEq total) by mouth 2 (two) times daily. 60 tablet 11  . Multiple Vitamin (MULTIVITAMIN WITH MINERALS) TABS tablet Take 1 tablet by mouth 2 (two) times a week.    . Naproxen Sod-diphenhydrAMINE (ALEVE PM) 220-25 MG TABS Take 2 tablets by mouth at bedtime as needed (sleep/pain).    . Omega-3 Fatty Acids (FISH OIL PO) Take 1 capsule by mouth 4 (four) times a week.     Marland Kitchen OVER THE COUNTER MEDICATION Apply 1 spray topically daily as needed (cramps). Topical Magnesium Spray    . rivaroxaban (XARELTO) 20 MG TABS tablet Take 1 tablet (20 mg total) by mouth daily. 30 tablet 5  . sacubitril-valsartan (ENTRESTO) 49-51  MG Take 1 tablet by mouth 2 (two) times daily. 60 tablet 11  . spironolactone (ALDACTONE) 25 MG tablet Take 25 mg by mouth daily.    Marland Kitchen torsemide (DEMADEX) 20 MG tablet Take 4 tablets (80 mg total) by mouth daily. 120 tablet 6   No current facility-administered medications for this visit.    Physical Exam: Vitals:   07/11/19 1514  BP: 102/64  Pulse: (!) 109  SpO2: 97%  Weight: 265 lb (120.2 kg)  Height: 5\' 10"  (1.778 m)    GEN- The patient is overweight appearing, alert and oriented x 3 today.   Head- normocephalic, atraumatic Eyes-  Sclera clear, conjunctiva pink Ears- hearing intact Oropharynx- clear Lungs-  normal work of breathing Heart- tachycardic irregular rhythm GI- soft  Extremities- no clubbing, cyanosis, or edema  Wt Readings from Last 3 Encounters:  07/11/19 265 lb (120.2 kg)  07/05/19 265 lb (120.2 kg)  06/20/19 258 lb 9.6 oz (117.3 kg)    EKG tracing ordered today is personally reviewed and shows atypical atrial flutter with LBBB, QRS 168 msec Echo 04/01/2019- EF 20%, sevre LA  enlargement, s/p MV repair  Assessment and Plan:  1. Persistent afib/ atypical flutter with RVR He has severe LA enlargement.  Ongoing arrhythmias s/p MAZE and AF ablation 04/26/2019.  Though he may continue to have improvement and is still within the first 3 months post ablation, I am not overly optimistic that he will maintain sinus long term.  Medical therapy has been limited by bradycardia Consider repeat cardioversion 3-4 weeks after BiV ICD is in place  2. Symptomatic bradycardia He has had issues with sinus bradycardia/ junctional rhythm as well as advanced conduction system disease with alternating R and L BBB.  I would therefore advise pacing.  Given nonischemic CM, EF 20%, and anticipated RV pacing> 40%, I would advise CRT-D.  3. Nonischemic CM (EF 20%) with LBBB and NYHA Class III CHF. The patient has a nonischemic CM (EF 20%), NYHA Class III CHF, and LBBB.  He is referred by  [name] for risk stratification of sudden death and consideration of ICD implantation.  At this time, he meets MADIT II/ SCD-HeFT criteria for ICD implantation for primary prevention of sudden death.  Given LBBB with QRS > 157msec, he also meets criteria for CRT.  I have had a thorough discussion with the patient reviewing options.  The patient has had opportunities to ask questions and have them answered. The patient and I have decided together through a shared decision making process to proceed with BiV ICD at this time.   Risks, benefits, alternatives to BiV ICD implantation were discussed in detail with the patient today. The patient understands that the risks include but are not limited to bleeding, infection, pneumothorax, perforation, tamponade, vascular damage, renal failure, MI, stroke, death, inappropriate shocks, and lead dislodgement and wishes to proceed.  We will therefore schedule device implantation at the next available time.  Hold xarelto 24 hours prior to the procedure.  4. Obesity Body mass index is 38.02 kg/m. Lifestyle modification is encouraged  5. OSA Compliance with CPAP encouarged  6. HTN Stable No change required today  7. Valvular heart disease S/p MV repair, AVR  Thompson Grayer MD, [facility] 07/11/2019 3:36 PM

## 2019-07-11 NOTE — Patient Instructions (Addendum)
Medication Instructions:  Your physician recommends that you continue on your current medications as directed. Please refer to the Current Medication list given to you today.  Labwork: None ordered.  Testing/Procedures: Your physician has recommended that you have a defibrillator inserted. An implantable cardioverter defibrillator (ICD) is a small device that is placed in your chest or, in rare cases, your abdomen. This device uses electrical pulses or shocks to help control life-threatening, irregular heartbeats that could lead the heart to suddenly stop beating (sudden cardiac arrest). Leads are attached to the ICD that goes into your heart. This is done in the hospital and usually requires an overnight stay. Please see the instruction sheet given to you today for more information.     Follow-Up:  See Instruction Letter   Any Other Special Instructions Will Be Listed Below (If Applicable).  If you need a refill on your cardiac medications before your next appointment, please call your pharmacy.    Cardioverter Defibrillator Implantation  An implantable cardioverter defibrillator (ICD) is a small device that is placed under the skin in the chest or abdomen. An ICD consists of a battery, a small computer (pulse generator), and wires (leads) that go into the heart. An ICD is used to detect and correct two types of dangerous irregular heartbeats (arrhythmias):  A rapid heart rhythm (tachycardia).  An arrhythmia in which the lower chambers of the heart (ventricles) contract in an uncoordinated way (fibrillation). When an ICD detects tachycardia, it sends a low-energy shock to the heart to restore the heartbeat to normal (cardioversion). This signal is usually painless. If cardioversion does not work or if the ICD detects fibrillation, it delivers a high-energy shock to the heart (defibrillation) to restart the heart. This shock may feel like a strong jolt in the chest. Your health care  provider may prescribe an ICD if:  You have had an arrhythmia that originated in the ventricles.  Your heart has been damaged by a disease or heart condition. Sometimes, ICDs are programmed to act as a device called a pacemaker. Pacemakers can be used to treat a slow heartbeat (bradycardia) or tachycardia by taking over the heart rate with electrical impulses. Tell a health care provider about:  Any allergies you have.  All medicines you are taking, including vitamins, herbs, eye drops, creams, and over-the-counter medicines.  Any problems you or family members have had with anesthetic medicines.  Any blood disorders you have.  Any surgeries you have had.  Any medical conditions you have.  Whether you are pregnant or may be pregnant. What are the risks? Generally, this is a safe procedure. However, problems may occur, including:  Swelling, bleeding, or bruising.  Infection.  Blood clots.  Damage to other structures or organs, such as nerves, blood vessels, or the heart.  Allergic reactions to medicines used during the procedure. What happens before the procedure? Staying hydrated Follow instructions from your health care provider about hydration, which may include:  Up to 2 hours before the procedure - you may continue to drink clear liquids, such as water, clear fruit juice, black coffee, and plain tea. Eating and drinking restrictions Follow instructions from your health care provider about eating and drinking, which may include:  8 hours before the procedure - stop eating heavy meals or foods such as meat, fried foods, or fatty foods.  6 hours before the procedure - stop eating light meals or foods, such as toast or cereal.  6 hours before the procedure - stop drinking milk  or drinks that contain milk.  2 hours before the procedure - stop drinking clear liquids. Medicine Ask your health care provider about:  Changing or stopping your normal medicines. This is  important if you take diabetes medicines or blood thinners.  Taking medicines such as aspirin and ibuprofen. These medicines can thin your blood. Do not take these medicines before your procedure if your doctor tells you not to. Tests  You may have blood tests.  You may have a test to check the electrical signals in your heart (electrocardiogram, ECG).  You may have imaging tests, such as a chest X-ray. General instructions  For 24 hours before the procedure, stop using products that contain nicotine or tobacco, such as cigarettes and e-cigarettes. If you need help quitting, ask your health care provider.  Plan to have someone take you home from the hospital or clinic.  You may be asked to shower with a germ-killing soap. What happens during the procedure?  To reduce your risk of infection: ? Your health care team will wash or sanitize their hands. ? Your skin will be washed with soap. ? Hair may be removed from the surgical area.  Small monitors will be put on your body. They will be used to check your heart, blood pressure, and oxygen level.  An IV tube will be inserted into one of your veins.  You will be given one or more of the following: ? A medicine to help you relax (sedative). ? A medicine to numb the area (local anesthetic). ? A medicine to make you fall asleep (general anesthetic).  Leads will be guided through a blood vessel into your heart and attached to your heart muscles. Depending on the ICD, the leads may go into one ventricle or they may go into both ventricles and into an upper chamber of the heart. An X-ray machine (fluoroscope) will be usedto help guide the leads.  A small incision will be made to create a deep pocket under your skin.  The pulse generator will be placed into the pocket.  The ICD will be tested.  The incision will be closed with stitches (sutures), skin glue, or staples.  A bandage (dressing) will be placed over the incision. This  procedure may vary among health care providers and hospitals. What happens after the procedure?  Your blood pressure, heart rate, breathing rate, and blood oxygen level will be monitored often until the medicines you were given have worn off.  A chest X-ray will be taken to check that the ICD is in the right place.  You will need to stay in the hospital for 1-2 days so your health care provider can make sure your ICD is working.  Do not drive for 24 hours if you received a sedative. Ask your health care provider when it is safe for you to drive.  You may be given an identification card explaining that you have an ICD. Summary  An implantable cardioverter defibrillator (ICD) is a small device that is placed under the skin in the chest or abdomen. It is used to detect and correct dangerous irregular heartbeats (arrhythmias).  An ICD consists of a battery, a small computer (pulse generator), and wires (leads) that go into the heart.  When an ICD detects rapid heart rhythm (tachycardia), it sends a low-energy shock to the heart to restore the heartbeat to normal (cardioversion). If cardioversion does not work or if the ICD detects uncoordinated heart contractions (fibrillation), it delivers a high-energy shock to  the heart (defibrillation) to restart the heart.  You will need to stay in the hospital for 1-2 days to make sure your ICD is working. This information is not intended to replace advice given to you by your health care provider. Make sure you discuss any questions you have with your health care provider. Document Released: 03/22/2002 Document Revised: 06/12/2017 Document Reviewed: 07/09/2016 Elsevier Patient Education  2020 [employer].

## 2019-07-12 DIAGNOSIS — G4733 Obstructive sleep apnea (adult) (pediatric): Secondary | ICD-10-CM | POA: Diagnosis not present

## 2019-07-14 ENCOUNTER — Telehealth (HOSPITAL_COMMUNITY): Payer: Self-pay

## 2019-07-14 ENCOUNTER — Encounter (HOSPITAL_COMMUNITY): Payer: Medicare HMO | Admitting: Cardiology

## 2019-07-18 ENCOUNTER — Telehealth (HOSPITAL_COMMUNITY): Payer: Self-pay | Admitting: *Deleted

## 2019-07-18 ENCOUNTER — Ambulatory Visit: Payer: Medicare HMO | Admitting: Thoracic Surgery (Cardiothoracic Vascular Surgery)

## 2019-07-18 ENCOUNTER — Encounter: Payer: Self-pay | Admitting: Thoracic Surgery (Cardiothoracic Vascular Surgery)

## 2019-07-18 ENCOUNTER — Other Ambulatory Visit: Payer: Self-pay | Admitting: Internal Medicine

## 2019-07-18 ENCOUNTER — Other Ambulatory Visit: Payer: Self-pay

## 2019-07-18 VITALS — BP 106/65 | HR 107 | Temp 97.7°F | Resp 20 | Ht 70.0 in | Wt 260.0 lb

## 2019-07-18 DIAGNOSIS — Z8679 Personal history of other diseases of the circulatory system: Secondary | ICD-10-CM | POA: Diagnosis not present

## 2019-07-18 DIAGNOSIS — Z9889 Other specified postprocedural states: Secondary | ICD-10-CM | POA: Diagnosis not present

## 2019-07-18 NOTE — Patient Instructions (Signed)
Continue all previous medications without any changes at this time  

## 2019-07-18 NOTE — Telephone Encounter (Signed)
Pt left VM stating he has left several messages on Audry Riles (pharmacist) VM regarding medication. He requested a call back. I called pt back at [phone] to inform him Ander Purpura has been out of the office and to see which medications he needed assistance with. Pt did not answer I left a VM requesting he return my call.

## 2019-07-18 NOTE — Progress Notes (Signed)
[address]       [place] [address]             [phone]     CARDIOTHORACIC SURGERY OFFICE NOTE  Referring Provider is Thompson Grayer, MD Primary Cardiologist is Quay Burow, MD PCP is Seward Carol, MD   HPI:  Patient is a 69 year old obese male with history ofaortic insufficiency, mitral regurgitation, long-standing persistent atrial fibrillation,hypertension,chronic diastolic congestive heart failure, and obstructive sleep apnea whoreturns to the office today for follow-up of aortic valve repair, mitral valve repair, and Maze procedure on May 18, 2018.He was last seen here in our office  on December 13, 2018.  He has continued to have problems with recurrent atrial fibrillation and atypical atrial flutter associated with elevated heart rates.  Follow-up echocardiogram performed April 01, 2019 revealed intact aortic valve repair and mitral valve repair but a significant drop in left ventricular systolic function with ejection fraction estimated only 20 to 25%.  The patient subsequently underwent formal EP study with ablation for atrial fibrillation and atrial flutter.  Unfortunately, the patient again experienced early return to atypical atrial flutter.  Use of AV nodal blocking agents were associated with bradycardia.  Patient was recently seen in follow-up by Dr. Rayann Heman and plans have been made for placement of permanent pacemaker defibrillator to facilitate medical rate control and effort to alleviate his tachycardia mediated heart failure.  Patient returns her office today and reports that overall he feels fairly well.  He remains frustrated with his multiple problems including need for long-term medications for control of heart rhythm and chronic diastolic congestive heart failure.  He states that his exercise tolerance is better than it was prior to his surgery, but he does get short of breath and tired with exertion.  He denies resting shortness of  breath, PND, orthopnea, or lower extremity edema.  He has no trouble sleeping at night.  He does not experience palpitations and he cannot tell whether or not he is in rhythm.  He remains chronically anticoagulated using Xarelto.   Current Outpatient Medications  Medication Sig Dispense Refill  . clobetasol ointment (TEMOVATE) [id number] % Apply 1 application topically daily as needed (rash).    Marland Kitchen KLOR-CON M20 20 MEQ tablet Take 1 tablet (20 mEq total) by mouth 2 (two) times daily. 60 tablet 11  . Multiple Vitamin (MULTIVITAMIN WITH MINERALS) TABS tablet Take 1 tablet by mouth 2 (two) times a week.    . Naproxen Sod-diphenhydrAMINE (ALEVE PM) 220-25 MG TABS Take 2 tablets by mouth at bedtime as needed (sleep/pain).    . Omega-3 Fatty Acids (FISH OIL PO) Take 1 capsule by mouth 4 (four) times a week.     Marland Kitchen OVER THE COUNTER MEDICATION Apply 1 spray topically daily as needed (cramps). Topical Magnesium Spray    . rivaroxaban (XARELTO) 20 MG TABS tablet Take 1 tablet (20 mg total) by mouth daily. 30 tablet 5  . sacubitril-valsartan (ENTRESTO) 49-51 MG Take 1 tablet by mouth 2 (two) times daily. 60 tablet 11  . spironolactone (ALDACTONE) 25 MG tablet Take 25 mg by mouth daily.    Marland Kitchen torsemide (DEMADEX) 20 MG tablet Take 4 tablets (80 mg total) by mouth daily. 120 tablet 6   No current facility-administered medications for this visit.      Physical Exam:   BP 106/65 (BP Location: Right Arm)   Pulse (!) 107   Temp 97.7 F (36.5 C) (Skin)   Resp 20  Ht 5\' 10"  (1.778 m)   Wt 260 lb (117.9 kg)   SpO2 95% Comment: RA  BMI 37.31 kg/m   General:  Obese but well-appearing  Chest:   Clear to auscultation  CV:   Elevated heart rate with regular rhythm  Incisions:  Completely healed  Abdomen:  Soft nontender  Extremities:  Warm and well-perfused  Diagnostic Tests:    ECHOCARDIOGRAM REPORT       Patient Name:   [name] Date of Exam: 04/01/2019 Medical Rec #:  [id number]       Height:        70.0 in Accession #:    [id number]      Weight:       258.8 lb Date of Birth:  [birth year]       BSA:          2.33 m Patient Age:    78 years        BP:           132/89 mmHg Patient Gender: M               HR:           111 bpm. Exam Location:  Outpatient  Procedure: 2D Echo  Indications:    Congestive Heart Failure   History:        Patient has prior history of Echocardiogram examinations, most                 recent 11/01/2018. Arrythmias:Atrial Fibrillation Risk                 Factors:Hypertension. Aortic Valve: A 50mm HAART 300 Aortic                 Annuloplasty Device Procedure Date: 05/18/2018 Mitral Valve: A 30                 mm 4D Memo Ring valve is present in the mitral position.                 Procedure Date: 05/18/2018.   Sonographer:    Mikki Santee RDCS (AE) Referring Phys: [place]    1. Left ventricular ejection fraction, by visual estimation, is 20 to 25%. The left ventricle has severely decreased function. Moderately increased left ventricular size. There is mildly increased left ventricular hypertrophy.  2. Global right ventricle has normal systolic function.The right ventricular size is mildly enlarged. No increase in right ventricular wall thickness.  3. Left atrial size was severely dilated.  4. Right atrial size was normal.  5. The mitral valve is abnormal. No evidence of mitral valve regurgitation. No evidence of mitral stenosis.  6. Post mitral valve repair with annuloplasty ring, 61mm.     Mean gradient 61mmHg.  7. The tricuspid valve is normal in structure. Tricuspid valve regurgitation was not visualized by color flow Doppler.  8. The aortic valve is abnormal Aortic valve regurgitation was not visualized by color flow Doppler. Structurally normal aortic valve, with no evidence of sclerosis or stenosis. 89mm HAART 300 Aortic Annuloplasty Device valve is present in the aortic  position. Procedure Date: 05/18/2018  9. The  pulmonic valve was normal in structure. Pulmonic valve regurgitation is not visualized by color flow Doppler. 10. There is mild dilatation of the ascending aorta measuring 41 mm. 11. Normal pulmonary artery systolic pressure. 12. The inferior vena cava is normal in size with greater than 50% respiratory variability, suggesting  right atrial pressure of 3 mmHg. 13. Aortic valve peak gradient measures 15.8 mmHg. 14. Aortic valve mean gradient measures 8.0 mmHg. 15. Left ventricular diastolic Doppler parameters are consistent with pseudonormalization pattern of LV diastolic filling.  FINDINGS  Left Ventricle: Left ventricular ejection fraction, by visual estimation, is 20 to 25%. The left ventricle has severely decreased function. There is mildly increased left ventricular hypertrophy. Moderately increased left ventricular size. Spectral  Doppler shows Left ventricular diastolic Doppler parameters are consistent with pseudonormalization pattern of LV diastolic filling.  Right Ventricle: The right ventricular size is mildly enlarged. No increase in right ventricular wall thickness. Global RV systolic function is has normal systolic function. The tricuspid regurgitant velocity is 2.39 m/s, and with an assumed right atrial  pressure of 3 mmHg, the estimated right ventricular systolic pressure is normal at 25.8 mmHg.  Left Atrium: Left atrial size was severely dilated.  Right Atrium: Right atrial size was normal in size  Pericardium: There is no evidence of pericardial effusion.  Mitral Valve: The mitral valve is abnormal. No evidence of mitral valve stenosis by observation. MV peak gradient, 13.6 mmHg. No evidence of mitral valve regurgitation. Post mitral valve repair with annuloplasty ring, 42mm. Mean gradient 30mmHg.  Tricuspid Valve: The tricuspid valve is normal in structure. Tricuspid valve regurgitation was not visualized by color flow Doppler.  Aortic Valve: The aortic valve is  abnormal. Aortic valve regurgitation was not visualized by color flow Doppler. The aortic valve is structurally normal, with no evidence of sclerosis or stenosis. Aortic valve mean gradient measures 8.0 mmHg. Aortic  valve peak gradient measures 15.8 mmHg. Aortic valve area, by VTI measures 3.79 cm. 90mm HAART 300 Aortic Annuloplasty Device valve is present in the aortic position. Procedure Date: 05/18/2018.  Pulmonic Valve: The pulmonic valve was normal in structure. Pulmonic valve regurgitation is not visualized by color flow Doppler.  Aorta: The aortic root, ascending aorta and aortic arch are all structurally normal, with no evidence of dilitation or obstruction. There is mild dilatation of the ascending aorta measuring 41 mm.  Venous: The inferior vena cava is normal in size with greater than 50% respiratory variability, suggesting right atrial pressure of 3 mmHg.  IAS/Shunts: No atrial level shunt detected by color flow Doppler. No ventricular septal defect is seen or detected. There is no evidence of an atrial septal defect.     LEFT VENTRICLE          Normals PLAX 2D LVIDd:         6.60 cm  3.6 cm LVIDs:         5.60 cm  1.7 cm LV PW:         1.20 cm  1.4 cm LV IVS:        1.20 cm  1.3 cm LVOT diam:     2.60 cm  2.0 cm LV SV:         70 ml    79 ml LV SV Index:   28.50    45 ml/m2 LVOT Area:     5.31 cm 3.14 cm2   LV Volumes (MOD)             Normals LV area d, A2C:    37.00 cm LV major d, A2C:   9.10 cm LV vol d, MOD A2C: 124.0 ml  68 ml  RIGHT VENTRICLE RV S prime:     7.72 cm/s TAPSE (M-mode): 1.5 cm  LEFT ATRIUM  Index       RIGHT ATRIUM           Index LA diam:        5.70 cm  2.45 cm/m  RA Area:     29.50 cm LA Vol (A2C):   191.0 ml 82.01 ml/m RA Volume:   104.00 ml 44.66 ml/m LA Vol (A4C):   140.0 ml 60.11 ml/m LA Biplane Vol: 167.0 ml 71.71 ml/m  AORTIC VALVE                    Normals AV Area (Vmax):    3.68 cm AV Area (Vmean):    3.49 cm     3.06 cm2 AV Area (VTI):     3.79 cm AV Vmax:           198.67 cm/s AV Vmean:          131.333 cm/s 77 cm/s AV VTI:            0.326 m      3.15 cm2 AV Peak Grad:      15.8 mmHg AV Mean Grad:      8.0 mmHg     3 mmHg LVOT Vmax:         137.67 cm/s LVOT Vmean:        86.300 cm/s  75 cm/s LVOT VTI:          0.233 m      25.3 cm LVOT/AV VTI ratio: 0.71         1   AORTA                 Normals Ao Root diam: 3.40 cm 31 mm  MITRAL VALVE              Normals TRICUSPID VALVE             Normals MV Area (PHT): 3.73 cm           TR Peak grad:   22.8 mmHg MV Peak grad:  13.6 mmHg  4 mmHg  TR Vmax:        295.00 cm/s 288 cm/s MV Mean grad:  6.0 mmHg MV Vmax:       1.84 m/s           SHUNTS MV Vmean:      115.3 cm/s         Systemic VTI:  0.23 m MV VTI:        0.27 m             Systemic Diam: 2.60 cm MV PHT:        59.00 msec 55 ms    Candee Furbish MD Electronically signed by Candee Furbish MD Signature Date/Time: 04/01/2019/3:05:02 PM    2 channel telemetry rhythm strip demonstrates what appears to be atypical atrial flutter with elevated heart rate   Impression:  Patient continues to experience persistent atypical atrial flutter with elevated heart rate at present.  Most recent follow-up echocardiogram performed last September revealed stable appearance of both the aortic valve and the mitral valve with no residual aortic insufficiency or mitral regurgitation.  Mean transvalvular gradients were low.  Unfortunately, the patient has severe left ventricular systolic dysfunction that is likely related to tachycardia mediated nonischemic cardiomyopathy and the patient failed an attempt at ablation last October.    Plan:  I discussed matters at length with the patient in the office today and expressed concern that the patient needs  to do something to bring his heart rate under satisfactory control.  Under the circumstances options include another attempt at catheter-based  ablation versus AV node ablation with permanent pacemaker placement versus permanent pacemaker placement with pharmacologic heart rate control.  The patient is currently contemplating proceeding with placement of permanent pacemaker with defibrillator and subsequent pharmacologic heart rate control.  I think this is a very reasonable option and have encouraged the patient to follow through with plans.   I have strongly encouraged the patient to follow-up closely with Dr. Rayann Heman and Dr. Aundra Dubin.  All of his questions have been addressed.  I spent in excess of 15 minutes during the conduct of this office consultation and >50% of this time involved direct face-to-face encounter with the patient for counseling and/or coordination of their care.    Valentina Gu. Roxy Manns, MD 07/18/2019 12:11 PM

## 2019-07-19 NOTE — Telephone Encounter (Signed)
Called patient 07/14/2019 to inquire about the SE he states he has been experiencing since increasing his Entresto, no answer,left voicemail to return call

## 2019-07-28 ENCOUNTER — Telehealth (HOSPITAL_COMMUNITY): Payer: Self-pay

## 2019-07-28 NOTE — Telephone Encounter (Signed)
No answer / Mailbox full for pre covid screening - LW

## 2019-07-29 ENCOUNTER — Other Ambulatory Visit: Payer: Self-pay

## 2019-07-29 ENCOUNTER — Encounter (HOSPITAL_COMMUNITY): Payer: Self-pay | Admitting: Cardiology

## 2019-07-29 ENCOUNTER — Ambulatory Visit (HOSPITAL_COMMUNITY)
Admission: RE | Admit: 2019-07-29 | Discharge: 2019-07-29 | Disposition: A | Discharge status: Home / Self Care | Payer: Medicare HMO | Source: Ambulatory Visit | Attending: Cardiology | Admitting: Cardiology

## 2019-07-29 VITALS — BP 112/68 | HR 107 | Wt 261.6 lb

## 2019-07-29 DIAGNOSIS — I110 Hypertensive heart disease with heart failure: Secondary | ICD-10-CM | POA: Diagnosis not present

## 2019-07-29 DIAGNOSIS — Z79899 Other long term (current) drug therapy: Secondary | ICD-10-CM | POA: Diagnosis not present

## 2019-07-29 DIAGNOSIS — I340 Nonrheumatic mitral (valve) insufficiency: Secondary | ICD-10-CM | POA: Diagnosis not present

## 2019-07-29 DIAGNOSIS — G4733 Obstructive sleep apnea (adult) (pediatric): Secondary | ICD-10-CM | POA: Diagnosis not present

## 2019-07-29 DIAGNOSIS — I482 Chronic atrial fibrillation, unspecified: Secondary | ICD-10-CM | POA: Diagnosis not present

## 2019-07-29 DIAGNOSIS — I5032 Chronic diastolic (congestive) heart failure: Secondary | ICD-10-CM

## 2019-07-29 DIAGNOSIS — R9431 Abnormal electrocardiogram [ECG] [EKG]: Secondary | ICD-10-CM | POA: Diagnosis not present

## 2019-07-29 DIAGNOSIS — I428 Other cardiomyopathies: Secondary | ICD-10-CM | POA: Insufficient documentation

## 2019-07-29 DIAGNOSIS — I5022 Chronic systolic (congestive) heart failure: Secondary | ICD-10-CM | POA: Diagnosis not present

## 2019-07-29 DIAGNOSIS — R001 Bradycardia, unspecified: Secondary | ICD-10-CM | POA: Insufficient documentation

## 2019-07-29 DIAGNOSIS — Z8249 Family history of ischemic heart disease and other diseases of the circulatory system: Secondary | ICD-10-CM | POA: Insufficient documentation

## 2019-07-29 DIAGNOSIS — R000 Tachycardia, unspecified: Secondary | ICD-10-CM | POA: Diagnosis not present

## 2019-07-29 DIAGNOSIS — I447 Left bundle-branch block, unspecified: Secondary | ICD-10-CM | POA: Insufficient documentation

## 2019-07-29 DIAGNOSIS — I484 Atypical atrial flutter: Secondary | ICD-10-CM | POA: Insufficient documentation

## 2019-07-29 DIAGNOSIS — Z7901 Long term (current) use of anticoagulants: Secondary | ICD-10-CM | POA: Insufficient documentation

## 2019-07-29 LAB — BASIC METABOLIC PANEL
Anion gap: 8 (ref 5–15)
BUN: 34 mg/dL — ABNORMAL HIGH (ref 8–23)
CO2: 27 mmol/L (ref 22–32)
Calcium: 9.3 mg/dL (ref 8.9–10.3)
Chloride: 105 mmol/L (ref 98–111)
Creatinine, Ser: 1.43 mg/dL — ABNORMAL HIGH (ref 0.61–1.24)
GFR calc Af Amer: 59 mL/min — ABNORMAL LOW (ref >60)
GFR calc non Af Amer: 51 mL/min — ABNORMAL LOW (ref >60)
Glucose, Bld: 109 mg/dL — ABNORMAL HIGH (ref 70–99)
Potassium: 4 mmol/L (ref 3.5–5.1)
Sodium: 140 mmol/L (ref 135–145)

## 2019-07-29 MED ORDER — SACUBITRIL-VALSARTAN 97-103 MG PO TABS: 1.0000 | ORAL_TABLET | Freq: Two times a day (BID) | ORAL | 6 refills | Status: DC

## 2019-07-29 NOTE — Patient Instructions (Signed)
INCREASE Entresto 97/103mg  (1 tab) twice a day   Labs today and repeat in 10 days We will only contact you if something comes back abnormal or we need to make some changes. Otherwise no news is good news!   Your physician recommends that you schedule a follow-up appointment 1 month after placement of your ICD.   Please call office at [phone] option 2 if you have any questions or concerns.    At the [facility], you and your health needs are our priority. As part of our continuing mission to provide you with exceptional heart care, we have created designated Provider Care Teams. These Care Teams include your primary Cardiologist (physician) and Advanced Practice Providers (APPs- Physician Assistants and Nurse Practitioners) who all work together to provide you with the care you need, when you need it.   You may see any of the following providers on your designated Care Team at your next follow up: Marland Kitchen Dr Glori Bickers . Dr [name] . Darrick Grinder, NP . Lyda Jester, PA . Audry Riles, PharmD   Please be sure to bring in all your medications bottles to every appointment.

## 2019-07-30 NOTE — Progress Notes (Signed)
PCP: Seward Carol, MD EP: Dr. Rayann Heman Cardiology: Dr. Gwenlyn Found HF Cardiology: Dr. Aundra Dubin  69 y.o. with history of chronic atrial fibrillation and flutter with failed Maze procedure, valvular heart disease s/p mitral and aortic valve repairs, and chronic systolic CHF was referred by Dr. Rayann Heman for CHF evaluation.  Patient has a long history of atrial fibrillation.  He was initially controlled by Tikosyn but it eventually became chronic.  He had significant mitral and aortic regurgitation, and in 11/19, he had mitral and aortic valve repairs with Maze and LA appendage ligation. Cath prior to valve surgery in 10/19 showed no significant coronary disease.  It appears from ECGs that he never went back into NSR.  He has had slow atrial fibrillation with rate in upper 30s-40s at times, so was never started on amiodarone.  Last echo in 4/20 showed EF 40-45% with stable aortic and mitral valve repairs.  He has developed worsening volume overload, and Lasix has been titrated up to 80 mg bid.  Most recently, he went into atypical atrial flutter with HR consistently in the 110s range.  He therefore was taken for DCCV 03/10/19.  Initially rhythm looked junctional in the 40s-50s, but he subsequently went back into rapid atypical atrial flutter with elevated rate (>100 bpm).   Echo was done in 9/20, showing EF worse at 20-25%.     He saw Dr. Rayann Heman and had atrial flutter + atrial fibrillation ablation in 10/20.  Post-procedure, he was noted to be in slow atrial fibrillation (rate 40s) at time of his atrial fibrillation clinic followup.   Patient returns for followup of CHF.  He is now in atypical flutter with 2:1 block, rate 100s.  Weight is stable.  He is short of breath walking up a hill but ok on flat ground.  No orthopnea/PND.  He is using CPAP.   ECG (personally reviewed): Atypical atrial flutter 2:1 block, LBBB 164 msec  Labs (8/20): K 3.9, creatinine 1.14, BNP 473, hgb 13.3 Labs (9/20): K 3.3 => 3.7, creatinine  1.35 => 1.3 Labs (12/20): K 4.1, creatinine 1.2  PMH: 1. HTN 2. Chronic atrial fibrillation/flutter: Failed Tikosyn.  Had Maze procedure 11/19 but does not appear to have ever gone back into NSR.  Had been in atrial fibrillation with bradycardia with rate in 30s-40s at times, now in atypical atrial flutter primarily with rate 100s-110s.  He failed DCCV 8/20.  No amiodarone due to h/o bradycardia.  - Atrial fibrillation + atrial flutter ablation in 10/20.  3. OSA: Uses CPAP.  4. Chronic systolic CHF: Nonischemic cardiomyopathy.  - 10/19 LHC: No coronary disease.  - Echo (4/20): EF 40-45%, mild LVH, moderate dilated RV with mildly decreased systolic function, s/p MV repair with no significant stenosis or regurgitation, s/p aortic valve repair with mild AI, mild aortic stenosis.  Dilated IVC.  - Echo (9/20): EF 20-25%, mild LVH with moderate LV dilation, s/p MV repair with mean gradient 6, s/p AoV repair with mean gradient 8. No significant MR or AI.  5. Valvular heart disease: Patient has history of MR and AI.  In 11/19, he had mitral valve repair, aortic valve repair, surgical Maze, and LA appendage excision.  6. LBBB  Social History   Socioeconomic History  . Marital status: Married    Spouse name: Not on file  . Number of children: Not on file  . Years of education: Not on file  . Highest education level: Not on file  Occupational History  . Occupation: Optician, dispensing and  Hospital doctor  Tobacco Use  . Smoking status: Never Smoker  . Smokeless tobacco: Never Used  Substance and Sexual Activity  . Alcohol use: No  . Drug use: No  . Sexual activity: Yes  Other Topics Concern  . Not on file  Social History Narrative   Pt lives in [place] with spouse.  Leadership training and behavioral safety training.   Social Determinants of Health   Financial Resource Strain:   . Difficulty of Paying Living Expenses: Not on file  Food Insecurity:   . Worried About Sales executive in the Last Year: Not on file  . Ran Out of Food in the Last Year: Not on file  Transportation Needs:   . Lack of Transportation (Medical): Not on file  . Lack of Transportation (Non-Medical): Not on file  Physical Activity:   . Days of Exercise per Week: Not on file  . Minutes of Exercise per Session: Not on file  Stress:   . Feeling of Stress : Not on file  Social Connections:   . Frequency of Communication with Friends and Family: Not on file  . Frequency of Social Gatherings with Friends and Family: Not on file  . Attends Religious Services: Not on file  . Active Member of Clubs or Organizations: Not on file  . Attends Archivist Meetings: Not on file  . Marital Status: Not on file  Intimate Partner Violence:   . Fear of Current or Ex-Partner: Not on file  . Emotionally Abused: Not on file  . Physically Abused: Not on file  . Sexually Abused: Not on file   Family History  Problem Relation Age of Onset  . Other Other        mother had a pacemaker  . Hypertension Father    ROS: All systems reviewed and negative except as per HPI.   Current Outpatient Medications  Medication Sig Dispense Refill  . clobetasol ointment (TEMOVATE) [id number] % Apply 1 application topically daily as needed (rash).    Marland Kitchen KLOR-CON M20 20 MEQ tablet Take 1 tablet (20 mEq total) by mouth 2 (two) times daily. 60 tablet 11  . Multiple Vitamin (MULTIVITAMIN WITH MINERALS) TABS tablet Take 1 tablet by mouth 2 (two) times a week.    . Naproxen Sod-diphenhydrAMINE (ALEVE PM) 220-25 MG TABS Take 2 tablets by mouth at bedtime as needed (sleep/pain).    . Omega-3 Fatty Acids (FISH OIL PO) Take 1 capsule by mouth 4 (four) times a week.     Marland Kitchen OVER THE COUNTER MEDICATION Apply 1 spray topically daily as needed (cramps). Topical Magnesium Spray    . rivaroxaban (XARELTO) 20 MG TABS tablet Take 1 tablet (20 mg total) by mouth daily. 30 tablet 5  . spironolactone (ALDACTONE) 25 MG tablet Take 25 mg by mouth  daily.    Marland Kitchen torsemide (DEMADEX) 20 MG tablet Take 4 tablets (80 mg total) by mouth daily. 120 tablet 6  . sacubitril-valsartan (ENTRESTO) 97-103 MG Take 1 tablet by mouth 2 (two) times daily. 60 tablet 6   No current facility-administered medications for this encounter.   BP 112/68   Pulse (!) 107   Wt 118.7 kg (261 lb 9.6 oz)   SpO2 97%   BMI 37.54 kg/m  General: NAD Neck: JVP 8 cm, no thyromegaly or thyroid nodule.  Lungs: Clear to auscultation bilaterally with normal respiratory effort. CV: Nondisplaced PMI.  Heart mildly tachy, regular S1/S2, no S3/S4, 2/6 SEM RUSB.  Trace  ankle edema.   Abdomen: Soft, nontender, no hepatosplenomegaly, no distention.  Skin: Intact without lesions or rashes.  Neurologic: Alert and oriented x 3.  Psych: Normal affect. Extremities: No clubbing or cyanosis.  HEENT: Normal.   Assessment/Plan: 1. Chronic systolic CHF: Echo in [id number] with EF 40-45%, moderate RV dilation/mildly decreased RV function.  Nonischemic cardiomyopathy, cath in 10/19 without significant coronary disease.  Repeat echo in 9/20 showed fall in EF to 20-25% in the setting of atrial fibrillation and atrial flutter with persistent tachycardia.  He is in atypical flutter HR around 100 today.  NYHA class II symptoms, stable weight and volume looks ok on current torsemide.   - Continue torsemide 80 mg daily.   - Continue spironolactone 25 mg daily.   - Increase Entresto to 97/103 bid.  BMET today and 10 days.   - Avoid beta blocker until CRT placed given history of slow atrial fibrillation versus junctional bradycardia that has alternated with rate atrial flutter.   - Wide LBBB, plan for CRT-D placement on 2/9.   2. Atrial arrhythmias: Long history of chronic atrial fibrillation, has had slow ventricular response in the past (HR 40s at times). He failed Tikosyn and have avoided amiodarone with bradycardia.  He had Maze procedure in 11/19 but it was not successful.  At initial appointment with  me, he was in atypical flutter with HR 110s.  He failed DCCV in 8/20.  He had flutter/fibrillation ablation in 10/20.  Initially after ablation, he was in slow atrial fibrillation (rate upper 30s-50s generally).  Now he is back in atypical atrial flutter with rate 100s.  - Plan for CRT-D device placement on 2/9.  Once we have that insurance against excessive bradycardia, will start amiodarone 200 mg bid and plan for DCCV after 3-4 wks.  If he cannot hold sinus rhythm after that, would benefit from AV nodal ablation.   - He will continue Xarelto.   3. OSA: Continue CPAP.  4. Valvular heart disease: S/p MV and AoV repairs in 11/19.  Valves looked stable on last echo in 9/20.  - He will need antibiotic prophylaxis with dental work.  5. Bradycardia: HR gets down to upper 30s-40s at times.  He is not symptomatic.  - As above, avoid nodal blockade until he has CRT-D device.    Followup with me in 1 month after CRT-D device placed.    [name] 07/30/2019

## 2019-08-03 ENCOUNTER — Telehealth (INDEPENDENT_AMBULATORY_CARE_PROVIDER_SITE_OTHER): Payer: Medicare HMO | Admitting: Internal Medicine

## 2019-08-03 ENCOUNTER — Telehealth: Payer: Self-pay

## 2019-08-03 ENCOUNTER — Encounter: Payer: Self-pay | Admitting: Internal Medicine

## 2019-08-03 VITALS — BP 117/76 | HR 109 | Ht 70.0 in | Wt 261.0 lb

## 2019-08-03 DIAGNOSIS — I5022 Chronic systolic (congestive) heart failure: Secondary | ICD-10-CM

## 2019-08-03 DIAGNOSIS — I447 Left bundle-branch block, unspecified: Secondary | ICD-10-CM

## 2019-08-03 DIAGNOSIS — I428 Other cardiomyopathies: Secondary | ICD-10-CM

## 2019-08-03 DIAGNOSIS — I4819 Other persistent atrial fibrillation: Secondary | ICD-10-CM

## 2019-08-03 DIAGNOSIS — G4733 Obstructive sleep apnea (adult) (pediatric): Secondary | ICD-10-CM

## 2019-08-03 DIAGNOSIS — I484 Atypical atrial flutter: Secondary | ICD-10-CM | POA: Diagnosis not present

## 2019-08-03 DIAGNOSIS — D6869 Other thrombophilia: Secondary | ICD-10-CM

## 2019-08-03 DIAGNOSIS — I10 Essential (primary) hypertension: Secondary | ICD-10-CM | POA: Diagnosis not present

## 2019-08-03 NOTE — Progress Notes (Signed)
Electrophysiology TeleHealth Note   Due to national recommendations of social distancing due to COVID 19, an audio/video telehealth visit is felt to be most appropriate for this patient at this time.  See MyChart message from today for the patient's consent to telehealth for [facility].  Date:  08/03/2019   ID:  [name], DOB 02-Sep-1950, MRN [id number]  Location: patient's home  Provider location:  [facility]  Evaluation Performed: Follow-up visit  PCP:  Seward Carol, MD   Electrophysiologist:  Dr Rayann Heman  Chief Complaint:  palpitations  History of Present Illness:    [name] is a 69 y.o. male who presents via telehealth conferencing today.  Since last being seen in our clinic, the patient reports doing reasonably well.  He continues to have atrial arrhythmias well as symptoms of bradycardia. Today, he denies symptoms of palpitations, chest pain, shortness of breath,  lower extremity edema, dizziness, presyncope, or syncope.  The patient is otherwise without complaint today.  The patient denies symptoms of fevers, chills, cough, or new SOB worrisome for COVID 19.  Past Medical History:  Diagnosis Date  . Aortic insufficiency 04/21/2018  . Aortic insufficiency   . Arthritis    "joints" (09/26/2013)  . Atrial enlargement, left   . Dyspnea    with exertion  . Hearing aid worn    both ears  . Hypertension   . Mitral regurgitation 04/21/2018  . Obesity   . OSA on CPAP    "mask adjusts to what I need" (09/26/2013)  . Persistent atrial fibrillation ([place])    cardioversion 06/06/13  . PONV (postoperative nausea and vomiting)   . Rotator cuff tear, right dec 2012   physical therapy done, decreased strength  . S/P aortic valve repair 05/18/2018   Complex valvuloplasty including plication of right coronary leaflet and 21 mm Biostable HAART annuloplasty ring  . S/P Maze operation for atrial fibrillation 05/18/2018   Complete bilateral atrial lesion set using  bipolar radiofrequency and cryothermy ablation with clipping of LA appendage  . S/P mitral valve repair 05/18/2018   Complex valvuloplasty including artificial Gore-tex neochord placement x4 and 30 mm Sorin Memo 4D ring annuloplasty  . Wears glasses     Past Surgical History:  Procedure Laterality Date  . AORTIC VALVE REPAIR N/A 05/18/2018   Procedure: AORTIC VALVE REPAIR using HAART 300 Aortic Annuloplasty Device size 3mm;  Surgeon: Rexene Alberts, MD;  Location: [place];  Service: Open Heart Surgery;  Laterality: N/A;  . ATRIAL FIBRILLATION ABLATION N/A 04/26/2019   Procedure: ATRIAL FIBRILLATION ABLATION;  Surgeon: Thompson Grayer, MD;  Location: [place] [facility];  Service: Cardiovascular;  Laterality: N/A;  . CARDIOVERSION N/A 06/06/2013   Procedure: CARDIOVERSION;  Surgeon: Sanda Klein, MD;  Location: [facility];  Service: Cardiovascular;  Laterality: N/A;  . CARDIOVERSION N/A 09/28/2013   Procedure: CARDIOVERSION BEDSIDE;  Surgeon: Peter M Martinique, MD;  Location: [place];  Service: Cardiovascular;  Laterality: N/A;  . CARDIOVERSION N/A 11/30/2018   Procedure: CARDIOVERSION;  Surgeon: Dorothy Spark, MD;  Location: [facility];  Service: Cardiovascular;  Laterality: N/A;  . CARDIOVERSION N/A 03/10/2019   Procedure: CARDIOVERSION;  Surgeon: [name], MD;  Location: [place];  Service: Endoscopy;  Laterality: N/A;  . CARPAL TUNNEL RELEASE Right 08/08/2013   Procedure: RIGHT CARPAL TUNNEL RELEASE;  Surgeon: Wynonia Sours, MD;  Location: [place];  Service: Orthopedics;  Laterality: Right;  . CARPAL TUNNEL RELEASE Left 2008  .  CLIPPING OF ATRIAL APPENDAGE  05/18/2018   Procedure: CLIPPING OF ATRIAL APPENDAGE using AtriCure Clip size 45;  Surgeon: Rexene Alberts, MD;  Location: [place];  Service: Open Heart Surgery;;  . KNEE ARTHROSCOPY Right 1990's  . MAZE N/A 05/18/2018   Procedure: MAZE;  Surgeon: Rexene Alberts, MD;  Location: [place];  Service: Open  Heart Surgery;  Laterality: N/A;  . MITRAL VALVE REPAIR N/A 05/18/2018   Procedure: MITRAL VALVE REPAIR (MVR) using 4D Memo Ring size 30;  Surgeon: Rexene Alberts, MD;  Location: [place];  Service: Open Heart Surgery;  Laterality: N/A;  . RIGHT/LEFT HEART CATH AND CORONARY ANGIOGRAPHY N/A 04/21/2018   Procedure: RIGHT/LEFT HEART CATH AND CORONARY ANGIOGRAPHY;  Surgeon: Nelva Bush, MD;  Location: [place] [facility];  Service: Cardiovascular;  Laterality: N/A;  . SHOULDER OPEN ROTATOR CUFF REPAIR Left 1990's  . SHOULDER OPEN ROTATOR CUFF REPAIR Right 11/2007  . TEE WITHOUT CARDIOVERSION N/A 04/21/2018   Procedure: TRANSESOPHAGEAL ECHOCARDIOGRAM (TEE);  Surgeon: Jerline Pain, MD;  Location[facility];  Service: Cardiovascular;  Laterality: N/A;  . TEE WITHOUT CARDIOVERSION N/A 05/18/2018   Procedure: TRANSESOPHAGEAL ECHOCARDIOGRAM (TEE);  Surgeon: Rexene Alberts, MD;  Location: [place];  Service: Open Heart Surgery;  Laterality: N/A;  . TONSILLECTOMY  1950's  . TOTAL HIP ARTHROPLASTY Left 2010  . TOTAL HIP REVISION Left 10/08/2011  . TOTAL HIP REVISION  04/09/2012   Procedure: TOTAL HIP REVISION;  Surgeon: Gearlean Alf, MD;  Location: [facility];  Service: Orthopedics;  Laterality: Left;  Left Hip Acetabular Revision vs Constrained Liner  . TOTAL KNEE ARTHROPLASTY Right 2006   . TRIGGER FINGER RELEASE Right 08/08/2013   Procedure: RELEASE STENOSING TENOSYNOVITIS RIGHT THUMB;  Surgeon: Wynonia Sours, MD;  Location: [place];  Service: Orthopedics;  Laterality: Right;    Current Outpatient Medications  Medication Sig Dispense Refill  . clobetasol ointment (TEMOVATE) [id number] % Apply 1 application topically daily as needed (rash).    Marland Kitchen KLOR-CON M20 20 MEQ tablet Take 1 tablet (20 mEq total) by mouth 2 (two) times daily. 60 tablet 11  . Multiple Vitamin (MULTIVITAMIN WITH MINERALS) TABS tablet Take 1 tablet by mouth 2 (two) times a week.    . Naproxen Sod-diphenhydrAMINE (ALEVE PM)  220-25 MG TABS Take 2 tablets by mouth at bedtime as needed (sleep/pain).    . Omega-3 Fatty Acids (FISH OIL PO) Take 1 capsule by mouth 4 (four) times a week.     Marland Kitchen OVER THE COUNTER MEDICATION Apply 1 spray topically daily as needed (cramps). Topical Magnesium Spray    . rivaroxaban (XARELTO) 20 MG TABS tablet Take 1 tablet (20 mg total) by mouth daily. 30 tablet 5  . sacubitril-valsartan (ENTRESTO) 97-103 MG Take 1 tablet by mouth 2 (two) times daily. 60 tablet 6  . spironolactone (ALDACTONE) 25 MG tablet Take 25 mg by mouth daily.    Marland Kitchen torsemide (DEMADEX) 20 MG tablet Take 4 tablets (80 mg total) by mouth daily. 120 tablet 6   No current facility-administered medications for this visit.    Allergies:   Metoprolol tartrate and Oxycodone   Social History:  The patient  reports that he has never smoked. He has never used smokeless tobacco. He reports that he does not drink alcohol or use drugs.   Family History:  The patient's family history includes Hypertension in his father; Other in an other family member.   ROS:  Please see the history of  present illness.   All other systems are personally reviewed and negative.    Exam:    Vital Signs:  BP 117/76   Pulse (!) 109   Ht 5\' 10"  (1.778 m)   Wt 261 lb (118.4 kg)   BMI 37.45 kg/m   Well sounding and appearing, alert and conversant, regular work of breathing,  good skin color Eyes- anicteric, neuro- grossly intact, skin- no apparent rash or lesions or cyanosis, mouth- oral mucosa is pink  Labs/Other Tests and Data Reviewed:    Recent Labs: 08/17/2018: TSH 2.710 11/16/2018: ALT 24 03/25/2019: B Natriuretic Peptide 870.7 04/11/2019: Hemoglobin 13.5; Platelets 151 07/29/2019: BUN 34; Creatinine, Ser 1.43; Potassium 4.0; Sodium 140   Wt Readings from Last 3 Encounters:  08/03/19 261 lb (118.4 kg)  07/29/19 261 lb 9.6 oz (118.7 kg)  07/18/19 260 lb (117.9 kg)       ASSESSMENT & PLAN:    1.  Nonischemic CM (EF 20%), LBBB with  intermittent AV block and symptomatic bradycardia At this time, he meets MADIT II/ SCD-HeFT criteria for ICD implantation for primary prevention of sudden death. He has LBBB with QRS > 150 msec and is expected to V paced > 40%.  He therefore meets criteria for CRT also.  I have had a thorough discussion with the patient reviewing options.  The patient and his wife have had opportunities to ask questions and have them answered. The patient and I have decided together through a shared decision making process to BiV ICD ICD at this time.   Risks, benefits, alternatives to BiV ICD implantation were discussed in detail with the patient today. The patient understands that the risks include but are not limited to bleeding, infection, pneumothorax, perforation, tamponade, vascular damage, renal failure, MI, stroke, death, inappropriate shocks, and lead dislodgement and wishes to proceed.  We will therefore schedule device implantation at the next available time.  2. Persistent atrial fibrillation/ atypical atrial flutter Continue current strategy May ultimately require AV nodal ablation We will uptitrate medicine after CRT has been placed Medical therapy has been limited by bradycardia.  3. HTN Stable No change required today  4. OSA Compliance with CPAP  5. Valvular hert disease S/p MV repair and AVR Stable No change required today     Patient Risk:  after full review of this patients clinical status, I feel that they are at high risk at this time.  Today, I have spent 25 minutes with the patient with telehealth technology discussing arrhythmia management .    Army Fossa, MD  08/03/2019 10:01 AM     [facility] [address] [place] [address] [phone] (office) [phone] (fax)

## 2019-08-03 NOTE — H&P (View-Only) (Signed)
Electrophysiology TeleHealth Note   Due to national recommendations of social distancing due to COVID 19, an audio/video telehealth visit is felt to be most appropriate for this patient at this time.  See MyChart message from today for the patient's consent to telehealth for [facility]  Date:  08/03/2019   ID:  [name], DOB September 06, 1950, MRN [id number]  Location: patient's home  Provider location:  [facility]  Evaluation Performed: Follow-up visit  PCP:  Seward Carol, MD   Electrophysiologist:  Dr Rayann Heman  Chief Complaint:  palpitations  History of Present Illness:    [name] is a 69 y.o. male who presents via telehealth conferencing today.  Since last being seen in our clinic, the patient reports doing reasonably well.  He continues to have atrial arrhythmias well as symptoms of bradycardia. Today, he denies symptoms of palpitations, chest pain, shortness of breath,  lower extremity edema, dizziness, presyncope, or syncope.  The patient is otherwise without complaint today.  The patient denies symptoms of fevers, chills, cough, or new SOB worrisome for COVID 19.  Past Medical History:  Diagnosis Date  . Aortic insufficiency 04/21/2018  . Aortic insufficiency   . Arthritis    "joints" (09/26/2013)  . Atrial enlargement, left   . Dyspnea    with exertion  . Hearing aid worn    both ears  . Hypertension   . Mitral regurgitation 04/21/2018  . Obesity   . OSA on CPAP    "mask adjusts to what I need" (09/26/2013)  . Persistent atrial fibrillation ([place])    cardioversion 06/06/13  . PONV (postoperative nausea and vomiting)   . Rotator cuff tear, right dec 2012   physical therapy done, decreased strength  . S/P aortic valve repair 05/18/2018   Complex valvuloplasty including plication of right coronary leaflet and 21 mm Biostable HAART annuloplasty ring  . S/P Maze operation for atrial fibrillation 05/18/2018   Complete bilateral atrial lesion set using  bipolar radiofrequency and cryothermy ablation with clipping of LA appendage  . S/P mitral valve repair 05/18/2018   Complex valvuloplasty including artificial Gore-tex neochord placement x4 and 30 mm Sorin Memo 4D ring annuloplasty  . Wears glasses     Past Surgical History:  Procedure Laterality Date  . AORTIC VALVE REPAIR N/A 05/18/2018   Procedure: AORTIC VALVE REPAIR using HAART 300 Aortic Annuloplasty Device size 90mm;  Surgeon: Rexene Alberts, MD;  Location: [place];  Service: Open Heart Surgery;  Laterality: N/A;  . ATRIAL FIBRILLATION ABLATION N/A 04/26/2019   Procedure: ATRIAL FIBRILLATION ABLATION;  Surgeon: Thompson Grayer, MD;  Location: [place] [facility];  Service: Cardiovascular;  Laterality: N/A;  . CARDIOVERSION N/A 06/06/2013   Procedure: CARDIOVERSION;  Surgeon: Sanda Klein, MD;  Location: [place] [facility];  Service: Cardiovascular;  Laterality: N/A;  . CARDIOVERSION N/A 09/28/2013   Procedure: CARDIOVERSION BEDSIDE;  Surgeon: Peter M Martinique, MD;  Location: [place];  Service: Cardiovascular;  Laterality: N/A;  . CARDIOVERSION N/A 11/30/2018   Procedure: CARDIOVERSION;  Surgeon: Dorothy Spark, MD;  Location: [facility];  Service: Cardiovascular;  Laterality: N/A;  . CARDIOVERSION N/A 03/10/2019   Procedure: CARDIOVERSION;  Surgeon: [name], MD;  Location: [place];  Service: Endoscopy;  Laterality: N/A;  . CARPAL TUNNEL RELEASE Right 08/08/2013   Procedure: RIGHT CARPAL TUNNEL RELEASE;  Surgeon: Wynonia Sours, MD;  Location: [place];  Service: Orthopedics;  Laterality: Right;  . CARPAL TUNNEL RELEASE Left 2008  .  CLIPPING OF ATRIAL APPENDAGE  05/18/2018   Procedure: CLIPPING OF ATRIAL APPENDAGE using AtriCure Clip size 45;  Surgeon: Rexene Alberts, MD;  Location: [place];  Service: Open Heart Surgery;;  . KNEE ARTHROSCOPY Right 1990's  . MAZE N/A 05/18/2018   Procedure: MAZE;  Surgeon: Rexene Alberts, MD;  Location: [place];  Service: Open  Heart Surgery;  Laterality: N/A;  . MITRAL VALVE REPAIR N/A 05/18/2018   Procedure: MITRAL VALVE REPAIR (MVR) using 4D Memo Ring size 30;  Surgeon: Rexene Alberts, MD;  Location: [place];  Service: Open Heart Surgery;  Laterality: N/A;  . RIGHT/LEFT HEART CATH AND CORONARY ANGIOGRAPHY N/A 04/21/2018   Procedure: RIGHT/LEFT HEART CATH AND CORONARY ANGIOGRAPHY;  Surgeon: Nelva Bush, MD;  Location: [place] [facility];  Service: Cardiovascular;  Laterality: N/A;  . SHOULDER OPEN ROTATOR CUFF REPAIR Left 1990's  . SHOULDER OPEN ROTATOR CUFF REPAIR Right 11/2007  . TEE WITHOUT CARDIOVERSION N/A 04/21/2018   Procedure: TRANSESOPHAGEAL ECHOCARDIOGRAM (TEE);  Surgeon: Jerline Pain, MD;  Location: [facility] ENDOSCOPY;  Service: Cardiovascular;  Laterality: N/A;  . TEE WITHOUT CARDIOVERSION N/A 05/18/2018   Procedure: TRANSESOPHAGEAL ECHOCARDIOGRAM (TEE);  Surgeon: Rexene Alberts, MD;  Location: [place];  Service: Open Heart Surgery;  Laterality: N/A;  . TONSILLECTOMY  1950's  . TOTAL HIP ARTHROPLASTY Left 2010  . TOTAL HIP REVISION Left 10/08/2011  . TOTAL HIP REVISION  04/09/2012   Procedure: TOTAL HIP REVISION;  Surgeon: Gearlean Alf, MD;  Location: [facility];  Service: Orthopedics;  Laterality: Left;  Left Hip Acetabular Revision vs Constrained Liner  . TOTAL KNEE ARTHROPLASTY Right 2006   . TRIGGER FINGER RELEASE Right 08/08/2013   Procedure: RELEASE STENOSING TENOSYNOVITIS RIGHT THUMB;  Surgeon: Wynonia Sours, MD;  Location: [place];  Service: Orthopedics;  Laterality: Right;    Current Outpatient Medications  Medication Sig Dispense Refill  . clobetasol ointment (TEMOVATE) [id number] % Apply 1 application topically daily as needed (rash).    Marland Kitchen KLOR-CON M20 20 MEQ tablet Take 1 tablet (20 mEq total) by mouth 2 (two) times daily. 60 tablet 11  . Multiple Vitamin (MULTIVITAMIN WITH MINERALS) TABS tablet Take 1 tablet by mouth 2 (two) times a week.    . Naproxen Sod-diphenhydrAMINE (ALEVE PM)  220-25 MG TABS Take 2 tablets by mouth at bedtime as needed (sleep/pain).    . Omega-3 Fatty Acids (FISH OIL PO) Take 1 capsule by mouth 4 (four) times a week.     Marland Kitchen OVER THE COUNTER MEDICATION Apply 1 spray topically daily as needed (cramps). Topical Magnesium Spray    . rivaroxaban (XARELTO) 20 MG TABS tablet Take 1 tablet (20 mg total) by mouth daily. 30 tablet 5  . sacubitril-valsartan (ENTRESTO) 97-103 MG Take 1 tablet by mouth 2 (two) times daily. 60 tablet 6  . spironolactone (ALDACTONE) 25 MG tablet Take 25 mg by mouth daily.    Marland Kitchen torsemide (DEMADEX) 20 MG tablet Take 4 tablets (80 mg total) by mouth daily. 120 tablet 6   No current facility-administered medications for this visit.    Allergies:   Metoprolol tartrate and Oxycodone   Social History:  The patient  reports that he has never smoked. He has never used smokeless tobacco. He reports that he does not drink alcohol or use drugs.   Family History:  The patient's family history includes Hypertension in his father; Other in an other family member.   ROS:  Please see the history of  present illness.   All other systems are personally reviewed and negative.    Exam:    Vital Signs:  BP 117/76   Pulse (!) 109   Ht 5\' 10"  (1.778 m)   Wt 261 lb (118.4 kg)   BMI 37.45 kg/m   Well sounding and appearing, alert and conversant, regular work of breathing,  good skin color Eyes- anicteric, neuro- grossly intact, skin- no apparent rash or lesions or cyanosis, mouth- oral mucosa is pink  Labs/Other Tests and Data Reviewed:    Recent Labs: 08/17/2018: TSH 2.710 11/16/2018: ALT 24 03/25/2019: B Natriuretic Peptide 870.7 04/11/2019: Hemoglobin 13.5; Platelets 151 07/29/2019: BUN 34; Creatinine, Ser 1.43; Potassium 4.0; Sodium 140   Wt Readings from Last 3 Encounters:  08/03/19 261 lb (118.4 kg)  07/29/19 261 lb 9.6 oz (118.7 kg)  07/18/19 260 lb (117.9 kg)       ASSESSMENT & PLAN:    1.  Nonischemic CM (EF 20%), LBBB with  intermittent AV block and symptomatic bradycardia At this time, he meets MADIT II/ SCD-HeFT criteria for ICD implantation for primary prevention of sudden death. He has LBBB with QRS > 150 msec and is expected to V paced > 40%.  He therefore meets criteria for CRT also.  I have had a thorough discussion with the patient reviewing options.  The patient and his wife have had opportunities to ask questions and have them answered. The patient and I have decided together through a shared decision making process to BiV ICD ICD at this time.   Risks, benefits, alternatives to BiV ICD implantation were discussed in detail with the patient today. The patient understands that the risks include but are not limited to bleeding, infection, pneumothorax, perforation, tamponade, vascular damage, renal failure, MI, stroke, death, inappropriate shocks, and lead dislodgement and wishes to proceed.  We will therefore schedule device implantation at the next available time.  2. Persistent atrial fibrillation/ atypical atrial flutter Continue current strategy May ultimately require AV nodal ablation We will uptitrate medicine after CRT has been placed Medical therapy has been limited by bradycardia.  3. HTN Stable No change required today  4. OSA Compliance with CPAP  5. Valvular hert disease S/p MV repair and AVR Stable No change required today     Patient Risk:  after full review of this patients clinical status, I feel that they are at high risk at this time.  Today, I have spent 25 minutes with the patient with telehealth technology discussing arrhythmia management .    Lance Fossa, MD  08/03/2019 10:01 AM     [facility] [address] [place] [address] [phone] (office) [phone] (fax)

## 2019-08-03 NOTE — Telephone Encounter (Signed)
-----   Message from Thompson Grayer, MD sent at 08/03/2019 10:13 AM EST ----- Scheduled for BiV ICD 2/9.  Hold xarelto 24 hours prior. Make sure he has his education

## 2019-08-08 NOTE — Telephone Encounter (Signed)
Attempted outreach to Pt.  Call went to VM.  Unable to leave a message.

## 2019-08-09 ENCOUNTER — Other Ambulatory Visit: Payer: Self-pay

## 2019-08-09 ENCOUNTER — Ambulatory Visit (HOSPITAL_COMMUNITY)
Admission: RE | Admit: 2019-08-09 | Discharge: 2019-08-09 | Disposition: A | Discharge status: Home / Self Care | Payer: Medicare HMO | Source: Ambulatory Visit | Attending: Cardiology | Admitting: Cardiology

## 2019-08-09 DIAGNOSIS — I4819 Other persistent atrial fibrillation: Secondary | ICD-10-CM | POA: Diagnosis not present

## 2019-08-09 DIAGNOSIS — G4733 Obstructive sleep apnea (adult) (pediatric): Secondary | ICD-10-CM | POA: Diagnosis not present

## 2019-08-09 DIAGNOSIS — Z9889 Other specified postprocedural states: Secondary | ICD-10-CM | POA: Diagnosis not present

## 2019-08-09 DIAGNOSIS — I484 Atypical atrial flutter: Secondary | ICD-10-CM | POA: Insufficient documentation

## 2019-08-09 DIAGNOSIS — I5043 Acute on chronic combined systolic (congestive) and diastolic (congestive) heart failure: Secondary | ICD-10-CM

## 2019-08-09 DIAGNOSIS — I5032 Chronic diastolic (congestive) heart failure: Secondary | ICD-10-CM

## 2019-08-09 DIAGNOSIS — I5042 Chronic combined systolic (congestive) and diastolic (congestive) heart failure: Secondary | ICD-10-CM | POA: Insufficient documentation

## 2019-08-09 DIAGNOSIS — I447 Left bundle-branch block, unspecified: Secondary | ICD-10-CM | POA: Diagnosis not present

## 2019-08-09 DIAGNOSIS — I428 Other cardiomyopathies: Secondary | ICD-10-CM

## 2019-08-09 DIAGNOSIS — Z8679 Personal history of other diseases of the circulatory system: Secondary | ICD-10-CM

## 2019-08-09 DIAGNOSIS — I5022 Chronic systolic (congestive) heart failure: Secondary | ICD-10-CM

## 2019-08-09 LAB — CBC WITH DIFFERENTIAL/PLATELET
Abs Immature Granulocytes: 0.03 10*3/uL (ref 0.00–0.07)
Basophils Absolute: 0.1 10*3/uL (ref 0.0–0.1)
Basophils Relative: 1 %
Eosinophils Absolute: 0.2 10*3/uL (ref 0.0–0.5)
Eosinophils Relative: 3 %
HCT: 44.2 % (ref 39.0–52.0)
Hemoglobin: 14.3 g/dL (ref 13.0–17.0)
Immature Granulocytes: 0 %
Lymphocytes Relative: 34 %
Lymphs Abs: 2.4 10*3/uL (ref 0.7–4.0)
MCH: 28.1 pg (ref 26.0–34.0)
MCHC: 32.4 g/dL (ref 30.0–36.0)
MCV: 87 fL (ref 80.0–100.0)
Monocytes Absolute: 0.8 10*3/uL (ref 0.1–1.0)
Monocytes Relative: 11 %
Neutro Abs: 3.5 10*3/uL (ref 1.7–7.7)
Neutrophils Relative %: 51 %
Platelets: 141 10*3/uL — ABNORMAL LOW (ref 150–400)
RBC: 5.08 MIL/uL (ref 4.22–5.81)
RDW: 14.5 % (ref 11.5–15.5)
WBC: 7 10*3/uL (ref 4.0–10.5)
nRBC: 0 % (ref 0.0–0.2)

## 2019-08-09 LAB — BASIC METABOLIC PANEL
Anion gap: 9 (ref 5–15)
BUN: 38 mg/dL — ABNORMAL HIGH (ref 8–23)
CO2: 27 mmol/L (ref 22–32)
Calcium: 9.3 mg/dL (ref 8.9–10.3)
Chloride: 103 mmol/L (ref 98–111)
Creatinine, Ser: 1.39 mg/dL — ABNORMAL HIGH (ref 0.61–1.24)
GFR calc Af Amer: >60 mL/min (ref >60)
GFR calc non Af Amer: 52 mL/min — ABNORMAL LOW (ref >60)
Glucose, Bld: 103 mg/dL — ABNORMAL HIGH (ref 70–99)
Potassium: 4.1 mmol/L (ref 3.5–5.1)
Sodium: 139 mmol/L (ref 135–145)

## 2019-08-12 DIAGNOSIS — G4733 Obstructive sleep apnea (adult) (pediatric): Secondary | ICD-10-CM | POA: Diagnosis not present

## 2019-08-17 ENCOUNTER — Other Ambulatory Visit (HOSPITAL_COMMUNITY): Payer: Self-pay | Admitting: Cardiology

## 2019-08-19 ENCOUNTER — Other Ambulatory Visit (HOSPITAL_COMMUNITY)
Admission: RE | Admit: 2019-08-19 | Discharge: 2019-08-19 | Disposition: A | Discharge status: Home / Self Care | Payer: Medicare HMO | Source: Ambulatory Visit | Attending: Internal Medicine | Admitting: Internal Medicine

## 2019-08-19 ENCOUNTER — Other Ambulatory Visit: Payer: Medicare HMO

## 2019-08-19 DIAGNOSIS — Z20822 Contact with and (suspected) exposure to covid-19: Secondary | ICD-10-CM | POA: Diagnosis not present

## 2019-08-19 DIAGNOSIS — Z01812 Encounter for preprocedural laboratory examination: Secondary | ICD-10-CM | POA: Diagnosis not present

## 2019-08-19 LAB — SARS CORONAVIRUS 2 (TAT 6-24 HRS): SARS Coronavirus 2: NEGATIVE

## 2019-08-22 NOTE — Progress Notes (Signed)
Instructed patient on the following items: Arrival time 11:30 Nothing to eat or drink after midnight No meds AM of procedure Responsible person to drive you home and stay with you for 24 hrs Wash with special soap night before and morning of procedure anti-coagulant drug instructions hold starting today.

## 2019-08-23 ENCOUNTER — Ambulatory Visit (HOSPITAL_COMMUNITY)
Admission: RE | Admit: 2019-08-23 | Discharge: 2019-08-23 | Disposition: A | Discharge status: Home / Self Care | Payer: Medicare HMO | Attending: Internal Medicine | Admitting: Internal Medicine

## 2019-08-23 ENCOUNTER — Ambulatory Visit (HOSPITAL_COMMUNITY): Payer: Medicare HMO

## 2019-08-23 ENCOUNTER — Ambulatory Visit (HOSPITAL_COMMUNITY)
Admission: RE | Disposition: A | Discharge status: Home / Self Care | Payer: Medicare HMO | Source: Home / Self Care | Attending: Internal Medicine

## 2019-08-23 ENCOUNTER — Other Ambulatory Visit: Payer: Self-pay

## 2019-08-23 DIAGNOSIS — I441 Atrioventricular block, second degree: Secondary | ICD-10-CM | POA: Insufficient documentation

## 2019-08-23 DIAGNOSIS — I509 Heart failure, unspecified: Secondary | ICD-10-CM | POA: Insufficient documentation

## 2019-08-23 DIAGNOSIS — Z885 Allergy status to narcotic agent status: Secondary | ICD-10-CM | POA: Diagnosis not present

## 2019-08-23 DIAGNOSIS — I428 Other cardiomyopathies: Secondary | ICD-10-CM | POA: Insufficient documentation

## 2019-08-23 DIAGNOSIS — Z6837 Body mass index (BMI) 37.0-37.9, adult: Secondary | ICD-10-CM | POA: Diagnosis not present

## 2019-08-23 DIAGNOSIS — Z7901 Long term (current) use of anticoagulants: Secondary | ICD-10-CM | POA: Insufficient documentation

## 2019-08-23 DIAGNOSIS — M1990 Unspecified osteoarthritis, unspecified site: Secondary | ICD-10-CM | POA: Insufficient documentation

## 2019-08-23 DIAGNOSIS — I484 Atypical atrial flutter: Secondary | ICD-10-CM | POA: Insufficient documentation

## 2019-08-23 DIAGNOSIS — Z96642 Presence of left artificial hip joint: Secondary | ICD-10-CM | POA: Insufficient documentation

## 2019-08-23 DIAGNOSIS — Z959 Presence of cardiac and vascular implant and graft, unspecified: Secondary | ICD-10-CM

## 2019-08-23 DIAGNOSIS — I4819 Other persistent atrial fibrillation: Secondary | ICD-10-CM | POA: Insufficient documentation

## 2019-08-23 DIAGNOSIS — Z006 Encounter for examination for normal comparison and control in clinical research program: Secondary | ICD-10-CM | POA: Insufficient documentation

## 2019-08-23 DIAGNOSIS — I495 Sick sinus syndrome: Secondary | ICD-10-CM | POA: Insufficient documentation

## 2019-08-23 DIAGNOSIS — E669 Obesity, unspecified: Secondary | ICD-10-CM | POA: Diagnosis not present

## 2019-08-23 DIAGNOSIS — I447 Left bundle-branch block, unspecified: Secondary | ICD-10-CM | POA: Diagnosis not present

## 2019-08-23 DIAGNOSIS — G4733 Obstructive sleep apnea (adult) (pediatric): Secondary | ICD-10-CM | POA: Diagnosis not present

## 2019-08-23 DIAGNOSIS — Z8249 Family history of ischemic heart disease and other diseases of the circulatory system: Secondary | ICD-10-CM | POA: Insufficient documentation

## 2019-08-23 DIAGNOSIS — Z79899 Other long term (current) drug therapy: Secondary | ICD-10-CM | POA: Insufficient documentation

## 2019-08-23 DIAGNOSIS — Z95810 Presence of automatic (implantable) cardiac defibrillator: Secondary | ICD-10-CM | POA: Diagnosis not present

## 2019-08-23 DIAGNOSIS — I5022 Chronic systolic (congestive) heart failure: Secondary | ICD-10-CM | POA: Diagnosis not present

## 2019-08-23 HISTORY — PX: BIV ICD INSERTION CRT-D: EP1195

## 2019-08-23 SURGERY — BIV ICD INSERTION CRT-D

## 2019-08-23 MED ORDER — SODIUM CHLORIDE 0.9 % IV SOLN
INTRAVENOUS | Status: AC
  Filled 2019-08-23: qty 2

## 2019-08-23 MED ORDER — LIDOCAINE HCL 1 % IJ SOLN
INTRAMUSCULAR | Status: AC
  Filled 2019-08-23: qty 20

## 2019-08-23 MED ORDER — IOHEXOL 350 MG/ML SOLN
INTRAVENOUS | Status: DC | PRN
  Administered 2019-08-23: 15 mL via INTRA_ARTERIAL
  Administered 2019-08-23: 7 mL via INTRAVENOUS

## 2019-08-23 MED ORDER — HYDROCODONE-ACETAMINOPHEN 5-325 MG PO TABS: 1.0000 | ORAL_TABLET | ORAL | Status: DC | PRN

## 2019-08-23 MED ORDER — FENTANYL CITRATE (PF) 100 MCG/2ML IJ SOLN
INTRAMUSCULAR | Status: DC | PRN
  Administered 2019-08-23: 25 ug via INTRAVENOUS
  Administered 2019-08-23: 12.5 ug via INTRAVENOUS

## 2019-08-23 MED ORDER — MIDAZOLAM HCL 5 MG/5ML IJ SOLN
INTRAMUSCULAR | Status: DC | PRN
  Administered 2019-08-23 (×4): 1 mg via INTRAVENOUS

## 2019-08-23 MED ORDER — SODIUM CHLORIDE 0.9% FLUSH: 3.0000 mL | Freq: Two times a day (BID) | INTRAVENOUS | Status: DC

## 2019-08-23 MED ORDER — SODIUM CHLORIDE 0.9 % IV SOLN: INTRAVENOUS | Status: DC

## 2019-08-23 MED ORDER — ACETAMINOPHEN 325 MG PO TABS: 325.0000 mg | ORAL_TABLET | ORAL | Status: DC | PRN

## 2019-08-23 MED ORDER — MIDAZOLAM HCL 5 MG/5ML IJ SOLN
INTRAMUSCULAR | Status: AC
  Filled 2019-08-23: qty 5

## 2019-08-23 MED ORDER — IOPAMIDOL (ISOVUE-370) INJECTION 76%: INTRAVENOUS | Status: DC | PRN

## 2019-08-23 MED ORDER — DEXTROSE 5 % IV SOLN
3.0000 g | INTRAVENOUS | Status: AC
  Administered 2019-08-23: 3 g via INTRAVENOUS
  Filled 2019-08-23 (×2): qty 3000

## 2019-08-23 MED ORDER — SODIUM CHLORIDE 0.9% FLUSH: 3.0000 mL | INTRAVENOUS | Status: DC | PRN

## 2019-08-23 MED ORDER — CHLORHEXIDINE GLUCONATE 4 % EX LIQD
4.0000 "application " | Freq: Once | CUTANEOUS | Status: DC
  Filled 2019-08-23: qty 60

## 2019-08-23 MED ORDER — SODIUM CHLORIDE 0.9 % IV SOLN: 250.0000 mL | INTRAVENOUS | Status: DC | PRN

## 2019-08-23 MED ORDER — FENTANYL CITRATE (PF) 100 MCG/2ML IJ SOLN
INTRAMUSCULAR | Status: AC
  Filled 2019-08-23: qty 2

## 2019-08-23 MED ORDER — HEPARIN (PORCINE) IN NACL 1000-0.9 UT/500ML-% IV SOLN
INTRAVENOUS | Status: AC
  Filled 2019-08-23: qty 500

## 2019-08-23 MED ORDER — HYDROCODONE-ACETAMINOPHEN 5-325 MG PO TABS: 1.0000 | ORAL_TABLET | Freq: Two times a day (BID) | ORAL | 0 refills | Status: DC | PRN

## 2019-08-23 MED ORDER — LIDOCAINE HCL 1 % IJ SOLN
INTRAMUSCULAR | Status: AC
  Filled 2019-08-23: qty 60

## 2019-08-23 MED ORDER — SODIUM CHLORIDE 0.9 % IV SOLN
80.0000 mg | INTRAVENOUS | Status: DC
  Filled 2019-08-23: qty 2

## 2019-08-23 MED ORDER — ONDANSETRON HCL 4 MG/2ML IJ SOLN: 4.0000 mg | Freq: Four times a day (QID) | INTRAMUSCULAR | Status: DC | PRN

## 2019-08-23 MED ORDER — LIDOCAINE HCL (PF) 1 % IJ SOLN
INTRAMUSCULAR | Status: DC | PRN
  Administered 2019-08-23: 60 mL

## 2019-08-23 MED ORDER — HEPARIN (PORCINE) IN NACL 1000-0.9 UT/500ML-% IV SOLN
INTRAVENOUS | Status: DC | PRN
  Administered 2019-08-23: 500 mL

## 2019-08-23 MED ORDER — RIVAROXABAN 20 MG PO TABS: 20.0000 mg | ORAL_TABLET | Freq: Every day | ORAL | 5 refills | Status: DC

## 2019-08-23 SURGICAL SUPPLY — 18 items
ADAPTER SEALING SSA-EW-09 (MISCELLANEOUS) ×2 IMPLANT
CABLE SURGICAL S-101-97-12 (CABLE) ×2 IMPLANT
CATH ATTAIN COMMAND 6250-MB2 (CATHETERS) ×2 IMPLANT
CATH HEX JOS 2-5-2 65CM 6F REP (CATHETERS) ×2 IMPLANT
ICD GALLANT HFCRTD CDHFA500Q (ICD Generator) ×2 IMPLANT
KIT ESSENTIALS PG (KITS) ×2 IMPLANT
LEAD DURATA 7122Q-65CM (Lead) ×2 IMPLANT
LEAD QUARTET 1458QL-86 (Lead) ×1 IMPLANT
LEAD TENDRIL MRI 52CM LPA1200M (Lead) ×2 IMPLANT
PAD PRO RADIOLUCENT 2001M-C (PAD) ×2 IMPLANT
QUARTET 1458QL-86 (Lead) ×2 IMPLANT
SHEATH 7FR PRELUDE SNAP 13 (SHEATH) ×2 IMPLANT
SHEATH 8FR PRELUDE SNAP 13 (SHEATH) ×2 IMPLANT
SHEATH 9.5FR PRELUDE SNAP 13 (SHEATH) ×2 IMPLANT
SHEATH WORLEY 9FR 62CM (SHEATH) ×2 IMPLANT
SLITTER 6232ADJ (MISCELLANEOUS) ×2 IMPLANT
TRAY PACEMAKER INSERTION (PACKS) ×2 IMPLANT
WIRE ACUITY WHISPER EDS 4648 (WIRE) ×2 IMPLANT

## 2019-08-23 NOTE — Discharge Instructions (Signed)
Resume xarelto on 08/26/2019  After Your ICD (Implantable Cardiac Defibrillator)   . You have a St. Jude ICD  . Do not lift your arm above shoulder height for 1 week after your procedure. After 7 days, you may progress as below.     Tuesday August 30, 2019  Wednesday August 31, 2019 Thursday September 01, 2019 Friday September 02, 2019   . Do not lift, push, pull, or carry anything over 10 pounds with the affected arm until 6 weeks (Tuesday October 04, 2019 ) after your procedure.   . Do not drive until you have been seen for your wound check, or as long as instructed by your healthcare provider.   . Monitor your defibrillator site for redness, swelling, and drainage. Call the device clinic at [phone] if you experience these symptoms or fever/chills.  . If your incision is sealed with Steri-strips or staples, you may shower 10 days after your procedure or when told by your provider. Do not remove the steri-strips or let the shower hit directly on your site. You may wash around your site with soap and water. Avoid lotions, ointments, or perfumes over your incision until it is well-healed.  . You may use a hot tub or a pool AFTER your wound check appointment if the incision is completely closed.  . Your ICD  may be MRI compatible. This will be discussed at your next office visit/wound check.   . Your ICD is designed to protect you from life threatening heart rhythms. Because of this, you may receive a shock.   o 1 shock with no symptoms:  Call the office during business hours. o 1 shock with symptoms (chest pain, chest pressure, dizziness, lightheadedness, shortness of breath, overall feeling unwell):  Call 911. o If you experience 2 or more shocks in 24 hours:  Call 911. o If you receive a shock, you should not drive for 6 months per the [place] DMV IF you receive appropriate therapy from your ICD.   . ICD Alerts:  Some alerts are vibratory and others beep. These are NOT emergencies.  Please call our office to let us know. If this occurs at night or on weekends, it can wait until the next business day. Send a remote transmission.  . If your device is capable of reading fluid status (for heart failure), you will be offered monthly monitoring to review this with you.   . Remote monitoring is used to monitor your ICD from home. This monitoring is scheduled every 91 days by our office. It allows [place] to keep an eye on the functioning of your device to ensure it is working properly. You will routinely see your Electrophysiologist annually (more often if necessary).    Cardioverter Defibrillator Implantation, Care After This sheet gives you information about how to care for yourself after your procedure. Your health care provider may also give you more specific instructions. If you have problems or questions, contact your health care provider. What can I expect after the procedure? After the procedure, it is common to have:  Some pain. It may last a few days.  A slight bump over the skin where the device was placed. Sometimes, it is possible to feel the device under the skin. This is normal.  During the months and years after your procedure, your health care provider will check the device, the leads, and the battery every few months. Eventually, when the battery is low, the device will be replaced.  You should receive  your defibrillator ID card for your new device in the next 4-8 weeks.  Follow these instructions at home: Medicines  Take over-the-counter and prescription medicines only as told by your health care provider.  If you were prescribed an antibiotic medicine, take it as told by your health care provider. Do not stop taking the antibiotic even if you start to feel better. Incision care        Follow instructions from your health care provider about how to take care of your incision area. Make sure you: ? Leave stitches (sutures), skin glue, or adhesive strips in  place. These skin closures may need to stay in place for 2 weeks or longer. If adhesive strip edges start to loosen and curl up, you may trim the loose edges. Do not remove adhesive strips completely unless your health care provider tells you to do that.  Check your incision area every day for signs of infection. Check for: ? More redness, swelling, or pain. ? More fluid or blood. ? Warmth. ? Pus or a bad smell.  Do not use lotions or ointments near the incision area unless told by your health care provider.  Keep the incision area clean and dry for 7 days after the procedure or for as long as told by your health care provider. It takes several weeks for the incision site to heal completely.  Do not take baths, swim, or use a hot tub until your health care provider approves. Activity  Try to walk a little every day. Exercising is important after this procedure. Also, use your shoulder on the side of the defibrillator in daily tasks that do not require a lot of motion.  For at least 1 week: ? Do not lift your upper arm above your shoulders. This means no tennis, golf, or swimming for this period of time. If you tend to sleep with your arm above your head, use a restraint to prevent this during sleep.  For at least 6 weeks: ? Avoid sudden jerking, pulling, or chopping movements that pull your upper arm far away from your body.  Ask your health care provider when you may go back to work.  Check with your health care provider before you start to drive or play sports. Electric and magnetic fields  Tell all health care providers that you have a defibrillator. This may prevent them from giving you an MRI scan because strong magnets are used for that test.  If you must pass through a metal detector, quickly walk through it. Do not stop under the detector, and do not stand near it.  Avoid places or objects that have a strong electric or magnetic field, including: ? Airport Herbalist. At  the airport, let officials know that you have a defibrillator. Your defibrillator ID card will let you be checked in a way that is safe for you and will not damage your defibrillator. Also, do not let a security person wave a magnetic wand near your defibrillator. That can make it stop working. ? Power plants. ? Large electrical generators. ? Anti-theft systems or electronic article surveillance (EAS). ? Radiofrequency transmission towers, such as cell phone and radio towers.  Do not use amateur (ham) radio equipment or electric (arc) welding torches. Some devices are safe to use if held at least 12 inches (30 cm) from your defibrillator. These include power tools, lawn mowers, and speakers. If you are unsure if something is safe to use, ask your health care provider.  Do  not use MP3 player headphones. They have magnets.  You may safely use electric blankets, heating pads, computers, and microwave ovens.  When using your cell phone, hold it to the ear that is on the opposite side from the defibrillator. Do not leave your cell phone in a pocket over the defibrillator. General instructions  Follow diet instructions from your health care provider, if this applies.  Always keep your defibrillator ID card with you. The card should list the implant date, device model, and manufacturer. Consider wearing a medical alert bracelet or necklace.  Have your defibrillator checked every 3-6 months or as often as told by your health care provider. Most defibrillators last for 4-8 years.  Keep all follow-up visits as told by your health care provider. This is important for your health care provider to make sure your chest is healing the way it should. Ask your health care provider when you should come back to have your stitches or staples taken out. Contact a health care provider if:  You gain weight suddenly.  Your legs or feet swell more than they have before.  It feels like your heart is fluttering or  skipping beats (heart palpitations).  You have more redness, swelling, or pain around your incision.  You have more fluid or blood coming from your incision.  Your incision feels warm to the touch.  You have pus or a bad smell coming from your incision.  You have a fever. Get help right away if:  You have chest pain.  You feel more than one shock.  You feel more short of breath than you have felt before.  You feel more light-headed than you have felt before.  Your incision starts to open up. This information is not intended to replace advice given to you by your health care provider. Make sure you discuss any questions you have with your health care provider.

## 2019-08-23 NOTE — Interval H&P Note (Signed)
History and Physical Interval Note:  08/23/2019 12:45 PM  KLINT KAAIHUE  has presented today for surgery, with the diagnosis of left bundle branch block - cardiomyopathy.  The various methods of treatment have been discussed with the patient and family. After consideration of risks, benefits and other options for treatment, the patient has consented to  Procedure(s): BIV ICD INSERTION CRT-D (N/A) as a surgical intervention.  The patient's history has been reviewed, patient examined, no change in status, stable for surgery.  I have reviewed the patient's chart and labs.  Questions were answered to the patient's satisfaction.    ICD Criteria  Current LVEF:20%. Within 12 months prior to implant: Yes   Heart failure history: Yes, Class III  Cardiomyopathy history: Yes, Non-Ischemic Cardiomyopathy.  Atrial Fibrillation/Atrial Flutter: Yes, Persistent (> 7 Days).  Ventricular tachycardia history: No.  Cardiac arrest history: No.  History of syndromes with risk of sudden death: No.  Previous ICD: No.  Current ICD indication: Primary  PPM indication: Yes. Pacing type: Ventricular. Greater than 40% RV pacing requirement anticipated. Indication: Sick Sinus Syndrome LBBB with intermittent AV block and sinus bradycardia  Class I or II Bradycardia indication present: Yes  Beta Blocker therapy for 3 or more months: No, medical reason.  Ace Inhibitor/ARB therapy for 3 or more months: Yes, prescribed.    I have seen ONA SEDGWICK is a 69 y.o. malepre-procedural and has been referred by [name] for consideration of BiVICD implant for primary prevention of sudden death.  The patient's chart has been reviewed and they meet criteria for BiV ICD implant.  I have had a thorough discussion with the patient reviewing options.  The patient has had opportunities to ask questions and have them answered. The patient and I have decided together through the [place] Support Tool to  proceed with BiV ICD at this time.  Risks, benefits, alternatives to ICD implantation were discussed in detail with the patient today. The patient  understands that the risks include but are not limited to bleeding, infection, pneumothorax, perforation, tamponade, vascular damage, renal failure, MI, stroke, death, inappropriate shocks, and lead dislodgement and wishes to proceed.     Thompson Grayer

## 2019-08-23 NOTE — Progress Notes (Signed)
   I personally reviewed discharge and wound care instructions with patient.  He verbalized understanding, and knows to call our office with any questions.   Legrand Como [address] [place], PA-C  08/23/2019 4:01 PM

## 2019-08-23 NOTE — Progress Notes (Signed)
   I personally reviewed the [place] PDMP (Prescription Drug Monitoring Program) website for this patient and discussed with Dr. Rayann Heman.  We will give FIVE (5) tablets of hydrocodone in the setting of surgery, to be taken q 12 hours as needed for pain.  No refills will be issued. Should use Tylenol and only use hydrocodone for breakthrough pain.   Legrand Como [address]" [place], PA-C  08/23/2019 1:31 PM

## 2019-08-24 MED FILL — Lidocaine HCl Local Inj 1%: INTRAMUSCULAR | Qty: 60 | Status: AC

## 2019-08-24 MED FILL — Lidocaine HCl Local Inj 1%: INTRAMUSCULAR | Qty: 20 | Status: AC

## 2019-08-24 MED FILL — Gentamicin Sulfate Inj 40 MG/ML: INTRAMUSCULAR | Qty: 80 | Status: AC

## 2019-09-02 ENCOUNTER — Telehealth: Payer: Self-pay | Admitting: Internal Medicine

## 2019-09-02 NOTE — Telephone Encounter (Signed)
  Wife is calling because they would like to know if it is okay for the patient to drive yet. He had device put in 08/23/19 and he has not had any pain medication for the last three days. He has appt Tuesday for f/u but they want to know if he can drive before that.

## 2019-09-02 NOTE — Telephone Encounter (Signed)
Spoke with spouse advised of current instructions, no driving until at least after wound check appointment.  This is scheduled for 09/06/19.

## 2019-09-06 ENCOUNTER — Ambulatory Visit (INDEPENDENT_AMBULATORY_CARE_PROVIDER_SITE_OTHER): Payer: Medicare HMO | Admitting: *Deleted

## 2019-09-06 ENCOUNTER — Other Ambulatory Visit: Payer: Self-pay

## 2019-09-06 DIAGNOSIS — I4819 Other persistent atrial fibrillation: Secondary | ICD-10-CM

## 2019-09-06 DIAGNOSIS — I5022 Chronic systolic (congestive) heart failure: Secondary | ICD-10-CM

## 2019-09-06 LAB — CUP PACEART INCLINIC DEVICE CHECK
Battery Remaining Longevity: 61 mo
Brady Statistic RA Percent Paced: 98 %
Brady Statistic RV Percent Paced: 98 %
Date Time Interrogation Session: 20210223104900
HighPow Impedance: 61.875
Implantable Lead Implant Date: 20210209
Implantable Lead Implant Date: 20210209
Implantable Lead Implant Date: 20210209
Implantable Lead Location: 753858
Implantable Lead Location: 753859
Implantable Lead Location: 753860
Implantable Pulse Generator Implant Date: 20210209
Lead Channel Impedance Value: 500 Ohm
Lead Channel Impedance Value: 500 Ohm
Lead Channel Impedance Value: 650 Ohm
Lead Channel Pacing Threshold Amplitude: 0.5 V
Lead Channel Pacing Threshold Amplitude: 0.625 V
Lead Channel Pacing Threshold Amplitude: 2 V
Lead Channel Pacing Threshold Pulse Width: 0.5 ms
Lead Channel Pacing Threshold Pulse Width: 0.5 ms
Lead Channel Pacing Threshold Pulse Width: 1 ms
Lead Channel Sensing Intrinsic Amplitude: 12 mV
Lead Channel Sensing Intrinsic Amplitude: 3.6 mV
Lead Channel Setting Pacing Amplitude: 1.375
Lead Channel Setting Pacing Amplitude: 1.625
Lead Channel Setting Pacing Amplitude: 3.5 V
Lead Channel Setting Pacing Pulse Width: 0.5 ms
Lead Channel Setting Pacing Pulse Width: 1 ms
Lead Channel Setting Sensing Sensitivity: 0.5 mV
Pulse Gen Serial Number: 111006442

## 2019-09-06 NOTE — Patient Instructions (Signed)
Call the office if you have any swelling , drainage, or redness at the wound site.

## 2019-09-06 NOTE — Progress Notes (Signed)
CRT-D device check in office. Thresholds and sensing consistent with previous device measurements. Lead impedance trends stable over time. AT/ AF burden < 1 %, 1 AMS episode with EGM that showed AT. No ventricular arrhythmia episodes recorded. Patient bi-ventricularly pacing 98% of the time. Device programmed with appropriate safety margins. Heart failure diagnostics reviewed and trends are stable for patient. No changes made this session. Estimated longevity 5 yrs 1 month.  Patient enrolled in remote follow up. Plan to check device remotely in 3 months and see in office in 6 months. Patient education completed including shock plan.

## 2019-09-09 NOTE — Telephone Encounter (Signed)
Patient was denied Patient assistance for [facility] as he is over the income limit.    Obtained PAN grant for patient to help with [facility] copay.  Approval Details . Patient ID Number: [id number] . Assistance Program: Heart Failure . Group Number: [id number] . Patient assistance starts on: 04/23/2019 and continues until 07/20/2020 . Amount of Assistance Available: 1,000.00 . Covered Services: Delene Loll (sacubitril/valsartan)  Audry Riles, PharmD, BCPS, BCCP, CPP Heart Failure Clinic Pharmacist [phone]

## 2019-09-11 DIAGNOSIS — G4733 Obstructive sleep apnea (adult) (pediatric): Secondary | ICD-10-CM | POA: Diagnosis not present

## 2019-09-21 ENCOUNTER — Encounter: Payer: Self-pay | Admitting: Internal Medicine

## 2019-09-21 ENCOUNTER — Telehealth (INDEPENDENT_AMBULATORY_CARE_PROVIDER_SITE_OTHER): Payer: Medicare HMO | Admitting: Internal Medicine

## 2019-09-21 ENCOUNTER — Other Ambulatory Visit: Payer: Self-pay

## 2019-09-21 VITALS — BP 114/66 | HR 61 | Wt 259.0 lb

## 2019-09-21 DIAGNOSIS — I10 Essential (primary) hypertension: Secondary | ICD-10-CM

## 2019-09-21 DIAGNOSIS — I4819 Other persistent atrial fibrillation: Secondary | ICD-10-CM | POA: Diagnosis not present

## 2019-09-21 DIAGNOSIS — D6869 Other thrombophilia: Secondary | ICD-10-CM

## 2019-09-21 DIAGNOSIS — I484 Atypical atrial flutter: Secondary | ICD-10-CM | POA: Diagnosis not present

## 2019-09-21 NOTE — Progress Notes (Signed)
Electrophysiology TeleHealth Note   Due to national recommendations of social distancing due to COVID 19, an audio/video telehealth visit is felt to be most appropriate for this patient at this time.  See MyChart message from today for the patient's consent to telehealth for [facility].  Date:  09/21/2019   ID:  [name], DOB Jun 05, 1951, MRN [id number]  Location: patient's home  Provider location:  [facility]  Evaluation Performed: Follow-up visit  PCP:  Seward Carol, MD   Electrophysiologist:  Dr Rayann Heman  Chief Complaint:  palpitations  History of Present Illness:    [name] is a 69 y.o. male who presents via telehealth conferencing today.  Since his BiV ICD implant, the patient reports doing very well.  Denies procedure related complications.  Remains in sinus rhythm!  Today, he denies symptoms of palpitations, chest pain, shortness of breath,  lower extremity edema, dizziness, presyncope, or syncope.  The patient is otherwise without complaint today.  The patient denies symptoms of fevers, chills, cough, or new SOB worrisome for COVID 19.  Past Medical History:  Diagnosis Date  . Aortic insufficiency 04/21/2018  . Aortic insufficiency   . Arthritis    "joints" (09/26/2013)  . Atrial enlargement, left   . Dyspnea    with exertion  . Hearing aid worn    both ears  . Hypertension   . Mitral regurgitation 04/21/2018  . Obesity   . OSA on CPAP    "mask adjusts to what I need" (09/26/2013)  . Persistent atrial fibrillation ([place])    cardioversion 06/06/13  . PONV (postoperative nausea and vomiting)   . Rotator cuff tear, right dec 2012   physical therapy done, decreased strength  . S/P aortic valve repair 05/18/2018   Complex valvuloplasty including plication of right coronary leaflet and 21 mm Biostable HAART annuloplasty ring  . S/P Maze operation for atrial fibrillation 05/18/2018   Complete bilateral atrial lesion set using bipolar radiofrequency  and cryothermy ablation with clipping of LA appendage  . S/P mitral valve repair 05/18/2018   Complex valvuloplasty including artificial Gore-tex neochord placement x4 and 30 mm Sorin Memo 4D ring annuloplasty  . Wears glasses     Past Surgical History:  Procedure Laterality Date  . AORTIC VALVE REPAIR N/A 05/18/2018   Procedure: AORTIC VALVE REPAIR using HAART 300 Aortic Annuloplasty Device size 34mm;  Surgeon: Rexene Alberts, MD;  Location: [place];  Service: Open Heart Surgery;  Laterality: N/A;  . ATRIAL FIBRILLATION ABLATION N/A 04/26/2019   Procedure: ATRIAL FIBRILLATION ABLATION;  Surgeon: Thompson Grayer, MD;  Location: [place] CV LAB;  Service: Cardiovascular;  Laterality: N/A;  . BIV ICD INSERTION CRT-D N/A 08/23/2019   Procedure: BIV ICD INSERTION CRT-D;  Surgeon: Thompson Grayer, MD;  Location: [place] CV LAB;  Service: Cardiovascular;  Laterality: N/A;  . CARDIOVERSION N/A 06/06/2013   Procedure: CARDIOVERSION;  Surgeon: Sanda Klein, MD;  Location: [place] ENDOSCOPY;  Service: Cardiovascular;  Laterality: N/A;  . CARDIOVERSION N/A 09/28/2013   Procedure: CARDIOVERSION BEDSIDE;  Surgeon: Peter M Martinique, MD;  Location: [place];  Service: Cardiovascular;  Laterality: N/A;  . CARDIOVERSION N/A 11/30/2018   Procedure: CARDIOVERSION;  Surgeon: Dorothy Spark, MD;  Location: [facility] ENDOSCOPY;  Service: Cardiovascular;  Laterality: N/A;  . CARDIOVERSION N/A 03/10/2019   Procedure: CARDIOVERSION;  Surgeon: [name], MD;  Location: [place];  Service: Endoscopy;  Laterality: N/A;  . CARPAL TUNNEL RELEASE Right 08/08/2013   Procedure: RIGHT  CARPAL TUNNEL RELEASE;  Surgeon: Wynonia Sours, MD;  Location: [place];  Service: Orthopedics;  Laterality: Right;  . CARPAL TUNNEL RELEASE Left 2008  . CLIPPING OF ATRIAL APPENDAGE  05/18/2018   Procedure: CLIPPING OF ATRIAL APPENDAGE using AtriCure Clip size 45;  Surgeon: Rexene Alberts, MD;  Location: [place];  Service:  Open Heart Surgery;;  . KNEE ARTHROSCOPY Right 1990's  . MAZE N/A 05/18/2018   Procedure: MAZE;  Surgeon: Rexene Alberts, MD;  Location: [place];  Service: Open Heart Surgery;  Laterality: N/A;  . MITRAL VALVE REPAIR N/A 05/18/2018   Procedure: MITRAL VALVE REPAIR (MVR) using 4D Memo Ring size 30;  Surgeon: Rexene Alberts, MD;  Location: [place];  Service: Open Heart Surgery;  Laterality: N/A;  . RIGHT/LEFT HEART CATH AND CORONARY ANGIOGRAPHY N/A 04/21/2018   Procedure: RIGHT/LEFT HEART CATH AND CORONARY ANGIOGRAPHY;  Surgeon: Nelva Bush, MD;  Location: [place] CV LAB;  Service: Cardiovascular;  Laterality: N/A;  . SHOULDER OPEN ROTATOR CUFF REPAIR Left 1990's  . SHOULDER OPEN ROTATOR CUFF REPAIR Right 11/2007  . TEE WITHOUT CARDIOVERSION N/A 04/21/2018   Procedure: TRANSESOPHAGEAL ECHOCARDIOGRAM (TEE);  Surgeon: Jerline Pain, MD;  Location: [facility] ENDOSCOPY;  Service: Cardiovascular;  Laterality: N/A;  . TEE WITHOUT CARDIOVERSION N/A 05/18/2018   Procedure: TRANSESOPHAGEAL ECHOCARDIOGRAM (TEE);  Surgeon: Rexene Alberts, MD;  Location: [place];  Service: Open Heart Surgery;  Laterality: N/A;  . TONSILLECTOMY  1950's  . TOTAL HIP ARTHROPLASTY Left 2010  . TOTAL HIP REVISION Left 10/08/2011  . TOTAL HIP REVISION  04/09/2012   Procedure: TOTAL HIP REVISION;  Surgeon: Gearlean Alf, MD;  Location: [facility];  Service: Orthopedics;  Laterality: Left;  Left Hip Acetabular Revision vs Constrained Liner  . TOTAL KNEE ARTHROPLASTY Right 2006   . TRIGGER FINGER RELEASE Right 08/08/2013   Procedure: RELEASE STENOSING TENOSYNOVITIS RIGHT THUMB;  Surgeon: Wynonia Sours, MD;  Location: [place];  Service: Orthopedics;  Laterality: Right;    Current Outpatient Medications  Medication Sig Dispense Refill  . clobetasol ointment (TEMOVATE) [id number] % Apply 1 application topically daily as needed (rash).    Marland Kitchen HYDROcodone-acetaminophen (NORCO/VICODIN) 5-325 MG tablet Take 1 tablet by mouth every 12  (twelve) hours as needed for moderate pain. 5 tablet 0  . KLOR-CON M20 20 MEQ tablet Take 1 tablet (20 mEq total) by mouth 2 (two) times daily. 60 tablet 11  . Multiple Vitamin (MULTIVITAMIN WITH MINERALS) TABS tablet Take 1 tablet by mouth 2 (two) times a week.    . Naproxen Sod-diphenhydrAMINE (ALEVE PM) 220-25 MG TABS Take 2 tablets by mouth at bedtime as needed (sleep/pain).    . Omega-3 Fatty Acids (FISH OIL PO) Take 1 capsule by mouth 4 (four) times a week.     Marland Kitchen OVER THE COUNTER MEDICATION Apply 1 spray topically daily as needed (cramps). Topical Magnesium Spray    . rivaroxaban (XARELTO) 20 MG TABS tablet Take 1 tablet (20 mg total) by mouth daily. 30 tablet 5  . sacubitril-valsartan (ENTRESTO) 97-103 MG Take 1 tablet by mouth 2 (two) times daily. 60 tablet 6  . spironolactone (ALDACTONE) 25 MG tablet TAKE 1 TABLET BY MOUTH EVERY DAY 90 tablet 1  . torsemide (DEMADEX) 20 MG tablet Take 4 tablets (80 mg total) by mouth daily. 120 tablet 6   No current facility-administered medications for this visit.    Allergies:   Metoprolol tartrate and Oxycodone   Social History:  The patient  reports that he has never smoked. He has never used smokeless tobacco. He reports that he does not drink alcohol or use drugs.   ROS:  Please see the history of present illness.   All other systems are personally reviewed and negative.    Exam:    Vital Signs:  BP 114/66   Pulse 61   Wt 259 lb (117.5 kg)   BMI 37.16 kg/m   Well sounding and appearing, alert and conversant, regular work of breathing,  good skin color Eyes- anicteric, neuro- grossly intact, skin- no apparent rash or lesions or cyanosis, mouth- oral mucosa is pink  Labs/Other Tests and Data Reviewed:    Recent Labs: 11/16/2018: ALT 24 03/25/2019: B Natriuretic Peptide 870.7 08/09/2019: BUN 38; Creatinine, Ser 1.39; Hemoglobin 14.3; Platelets 141; Potassium 4.1; Sodium 139   Wt Readings from Last 3 Encounters:  09/21/19 259 lb (117.5  kg)  08/23/19 255 lb (115.7 kg)  08/03/19 261 lb (118.4 kg)     Last device in clinic transmissions is reviewed from [facility] PDF which reveals normal device function, no arrhythmias  98% BiV paced   ASSESSMENT & PLAN:    1.  Persistent atrial fibrillation/ atypical atrial flutter Remains in sinus rhythm since his BiV implant procedure. Continue current strategy He is on xarelto for stroke prevention  2. HTN Stable No change required today  3. Nonischemic CM/ LBBB intermittent CHB Doing well s/p CRT-D Continue current plan of follow-up care Will enroll in ICM device clinic on return   Follow-up:  As scheduled with me   Patient Risk:  after full review of this patients clinical status, I feel that they are at moderate risk at this time.  Today, I have spent 15 minutes with the patient with telehealth technology discussing arrhythmia management .    SignedThompson Grayer, MD  09/21/2019 3:37 PM     [place] [address] [phone] (office) [phone] (fax)

## 2019-09-27 ENCOUNTER — Other Ambulatory Visit: Payer: Self-pay

## 2019-09-27 ENCOUNTER — Ambulatory Visit (HOSPITAL_COMMUNITY)
Admission: RE | Admit: 2019-09-27 | Discharge: 2019-09-27 | Disposition: A | Discharge status: Home / Self Care | Payer: Medicare HMO | Source: Ambulatory Visit | Attending: Cardiology | Admitting: Cardiology

## 2019-09-27 ENCOUNTER — Encounter (HOSPITAL_COMMUNITY): Payer: Self-pay | Admitting: Cardiology

## 2019-09-27 VITALS — BP 130/72 | HR 60 | Wt 265.2 lb

## 2019-09-27 DIAGNOSIS — I482 Chronic atrial fibrillation, unspecified: Secondary | ICD-10-CM | POA: Insufficient documentation

## 2019-09-27 DIAGNOSIS — R001 Bradycardia, unspecified: Secondary | ICD-10-CM | POA: Insufficient documentation

## 2019-09-27 DIAGNOSIS — I447 Left bundle-branch block, unspecified: Secondary | ICD-10-CM | POA: Diagnosis not present

## 2019-09-27 DIAGNOSIS — Z7901 Long term (current) use of anticoagulants: Secondary | ICD-10-CM | POA: Diagnosis not present

## 2019-09-27 DIAGNOSIS — Z79899 Other long term (current) drug therapy: Secondary | ICD-10-CM | POA: Insufficient documentation

## 2019-09-27 DIAGNOSIS — I5022 Chronic systolic (congestive) heart failure: Secondary | ICD-10-CM

## 2019-09-27 DIAGNOSIS — Z8249 Family history of ischemic heart disease and other diseases of the circulatory system: Secondary | ICD-10-CM | POA: Insufficient documentation

## 2019-09-27 DIAGNOSIS — I110 Hypertensive heart disease with heart failure: Secondary | ICD-10-CM | POA: Insufficient documentation

## 2019-09-27 DIAGNOSIS — I4819 Other persistent atrial fibrillation: Secondary | ICD-10-CM | POA: Diagnosis not present

## 2019-09-27 DIAGNOSIS — I5032 Chronic diastolic (congestive) heart failure: Secondary | ICD-10-CM | POA: Diagnosis not present

## 2019-09-27 DIAGNOSIS — G4733 Obstructive sleep apnea (adult) (pediatric): Secondary | ICD-10-CM | POA: Insufficient documentation

## 2019-09-27 DIAGNOSIS — I484 Atypical atrial flutter: Secondary | ICD-10-CM | POA: Insufficient documentation

## 2019-09-27 DIAGNOSIS — I428 Other cardiomyopathies: Secondary | ICD-10-CM | POA: Insufficient documentation

## 2019-09-27 LAB — CBC
HCT: 42.3 % (ref 39.0–52.0)
Hemoglobin: 14 g/dL (ref 13.0–17.0)
MCH: 29 pg (ref 26.0–34.0)
MCHC: 33.1 g/dL (ref 30.0–36.0)
MCV: 87.8 fL (ref 80.0–100.0)
Platelets: 114 10*3/uL — ABNORMAL LOW (ref 150–400)
RBC: 4.82 MIL/uL (ref 4.22–5.81)
RDW: 13.7 % (ref 11.5–15.5)
WBC: 7.4 10*3/uL (ref 4.0–10.5)
nRBC: 0 % (ref 0.0–0.2)

## 2019-09-27 LAB — BASIC METABOLIC PANEL
Anion gap: 9 (ref 5–15)
BUN: 36 mg/dL — ABNORMAL HIGH (ref 8–23)
CO2: 28 mmol/L (ref 22–32)
Calcium: 9.9 mg/dL (ref 8.9–10.3)
Chloride: 103 mmol/L (ref 98–111)
Creatinine, Ser: 1.32 mg/dL — ABNORMAL HIGH (ref 0.61–1.24)
GFR calc Af Amer: >60 mL/min (ref >60)
GFR calc non Af Amer: 56 mL/min — ABNORMAL LOW (ref >60)
Glucose, Bld: 114 mg/dL — ABNORMAL HIGH (ref 70–99)
Potassium: 4.7 mmol/L (ref 3.5–5.1)
Sodium: 140 mmol/L (ref 135–145)

## 2019-09-27 MED ORDER — CARVEDILOL 3.125 MG PO TABS: 3.1250 mg | ORAL_TABLET | Freq: Two times a day (BID) | ORAL | 3 refills | Status: DC

## 2019-09-27 NOTE — Patient Instructions (Signed)
START Coreg 3.125mg  (1 tab) twice a day   Labs today We will only contact you if something comes back abnormal or we need to make some changes. Otherwise no news is good news!   Your physician has requested that you have an echocardiogram. Echocardiography is a painless test that uses sound waves to create images of your heart. It provides your doctor with information about the size and shape of your heart and how well your heart's chambers and valves are working. This procedure takes approximately one hour. There are no restrictions for this procedure.   Your physician recommends that you schedule a follow-up appointment in: 3 months with an ECHO   Please call office at [phone] option 2 if you have any questions or concerns.    At the [facility], you and your health needs are our priority. As part of our continuing mission to provide you with exceptional heart care, we have created designated Provider Care Teams. These Care Teams include your primary Cardiologist (physician) and Advanced Practice Providers (APPs- Physician Assistants and Nurse Practitioners) who all work together to provide you with the care you need, when you need it.    You may see any of the following providers on your designated Care Team at your next follow up: Marland Kitchen Dr Glori Bickers . Dr [name] . Darrick Grinder, NP . Lyda Jester, PA . Audry Riles, PharmD   Please be sure to bring in all your medications bottles to every appointment.

## 2019-09-27 NOTE — Progress Notes (Signed)
PCP: Seward Carol, MD EP: Dr. Rayann Heman Cardiology: Dr. Gwenlyn Found HF Cardiology: Dr. Aundra Dubin  69 y.o. with history of chronic atrial fibrillation and flutter with failed Maze procedure, valvular heart disease s/p mitral and aortic valve repairs, and chronic systolic CHF was referred by Dr. Rayann Heman for CHF evaluation.  Patient has a long history of atrial fibrillation.  He was initially controlled by Tikosyn but it eventually became chronic.  He had significant mitral and aortic regurgitation, and in 11/19, he had mitral and aortic valve repairs with Maze and LA appendage ligation. Cath prior to valve surgery in 10/19 showed no significant coronary disease.  It appears from ECGs that he never went back into NSR.  He has had slow atrial fibrillation with rate in upper 30s-40s at times, so was never started on amiodarone.  Last echo in 4/20 showed EF 40-45% with stable aortic and mitral valve repairs.  He has developed worsening volume overload, and Lasix has been titrated up to 80 mg bid.  Most recently, he went into atypical atrial flutter with HR consistently in the 110s range.  He therefore was taken for DCCV 03/10/19.  Initially rhythm looked junctional in the 40s-50s, but he subsequently went back into rapid atypical atrial flutter with elevated rate (>100 bpm).   Echo was done in 9/20, showing EF worse at 20-25%.     He saw Dr. Rayann Heman and had atrial flutter + atrial fibrillation ablation in 10/20.  Post-procedure, he was noted to be in slow atrial fibrillation (rate 40s) at time of his atrial fibrillation clinic followup.   Patient had [employer] Jude CRT-D implantation in 2/21, he went into NSR after this and remains a-paced today.  He returns today for followup of CHF.  He is doing very well, feels better since CRT-D implantation.  No significant dyspnea walking on flat ground, can get up a hill without trouble.  Weight at home has been stable.  No lightheadedness.    ECG (personally reviewed): A-BiV  pacing  Labs (8/20): K 3.9, creatinine 1.14, BNP 473, hgb 13.3 Labs (9/20): K 3.3 => 3.7, creatinine 1.35 => 1.3 Labs (12/20): K 4.1, creatinine 1.2 Labs (1/21): K 4.1, creatinine 1.39  PMH: 1. HTN 2. Paroxysmal atrial fibrillation/flutter: Failed Tikosyn.  Had Maze procedure 11/19 but does not appear to have ever gone back into NSR.  Had been in atrial fibrillation with bradycardia with rate in 30s-40s at times, now in atypical atrial flutter primarily with rate 100s-110s.  He failed DCCV 8/20.  No amiodarone due to h/o bradycardia.  - Atrial fibrillation + atrial flutter ablation in 10/20.  3. OSA: Uses CPAP.  4. Chronic systolic CHF: Nonischemic cardiomyopathy.  - 10/19 LHC: No coronary disease.  - Echo (4/20): EF 40-45%, mild LVH, moderate dilated RV with mildly decreased systolic function, s/p MV repair with no significant stenosis or regurgitation, s/p aortic valve repair with mild AI, mild aortic stenosis.  Dilated IVC.  - Echo (9/20): EF 20-25%, mild LVH with moderate LV dilation, s/p MV repair with mean gradient 6, s/p AoV repair with mean gradient 8. No significant MR or AI.  - St Jude CRT-D implantation in 2/21.  5. Valvular heart disease: Patient has history of MR and AI.  In 11/19, he had mitral valve repair, aortic valve repair, surgical Maze, and LA appendage excision.  6. LBBB  Social History   Socioeconomic History  . Marital status: Married    Spouse name: Not on file  . Number of children: Not  on file  . Years of education: Not on file  . Highest education level: Not on file  Occupational History  . Occupation: Agricultural consultant  Tobacco Use  . Smoking status: Never Smoker  . Smokeless tobacco: Never Used  Substance and Sexual Activity  . Alcohol use: No  . Drug use: No  . Sexual activity: Yes  Other Topics Concern  . Not on file  Social History Narrative   Pt lives in [place] with spouse.  Leadership training and behavioral safety  training.   Social Determinants of Health   Financial Resource Strain:   . Difficulty of Paying Living Expenses:   Food Insecurity:   . Worried About Charity fundraiser in the Last Year:   . Arboriculturist in the Last Year:   Transportation Needs: No Transportation Needs  . Lack of Transportation (Medical): No  . Lack of Transportation (Non-Medical): No  Physical Activity:   . Days of Exercise per Week:   . Minutes of Exercise per Session:   Stress:   . Feeling of Stress :   Social Connections:   . Frequency of Communication with Friends and Family:   . Frequency of Social Gatherings with Friends and Family:   . Attends Religious Services:   . Active Member of Clubs or Organizations:   . Attends Archivist Meetings:   Marland Kitchen Marital Status:   Intimate Partner Violence:   . Fear of Current or Ex-Partner:   . Emotionally Abused:   Marland Kitchen Physically Abused:   . Sexually Abused:    Family History  Problem Relation Age of Onset  . Other Other        mother had a pacemaker  . Hypertension Father    ROS: All systems reviewed and negative except as per HPI.   Current Outpatient Medications  Medication Sig Dispense Refill  . clobetasol ointment (TEMOVATE) [id number] % Apply 1 application topically daily as needed (rash).    Marland Kitchen HYDROcodone-acetaminophen (NORCO/VICODIN) 5-325 MG tablet Take 1 tablet by mouth every 12 (twelve) hours as needed for moderate pain. 5 tablet 0  . KLOR-CON M20 20 MEQ tablet Take 1 tablet (20 mEq total) by mouth 2 (two) times daily. 60 tablet 11  . Multiple Vitamin (MULTIVITAMIN WITH MINERALS) TABS tablet Take 1 tablet by mouth 2 (two) times a week.    . Naproxen Sod-diphenhydrAMINE (ALEVE PM) 220-25 MG TABS Take 2 tablets by mouth at bedtime as needed (sleep/pain).    . Omega-3 Fatty Acids (FISH OIL PO) Take 1 capsule by mouth 4 (four) times a week.     Marland Kitchen OVER THE COUNTER MEDICATION Apply 1 spray topically daily as needed (cramps). Topical Magnesium Spray     . rivaroxaban (XARELTO) 20 MG TABS tablet Take 1 tablet (20 mg total) by mouth daily. 30 tablet 5  . sacubitril-valsartan (ENTRESTO) 97-103 MG Take 1 tablet by mouth 2 (two) times daily. 60 tablet 6  . spironolactone (ALDACTONE) 25 MG tablet TAKE 1 TABLET BY MOUTH EVERY DAY 90 tablet 1  . torsemide (DEMADEX) 20 MG tablet Take 4 tablets (80 mg total) by mouth daily. 120 tablet 6  . carvedilol (COREG) 3.125 MG tablet Take 1 tablet (3.125 mg total) by mouth 2 (two) times daily. 60 tablet 3   No current facility-administered medications for this encounter.   BP 130/72   Pulse 60   Wt 120.3 kg (265 lb 3.2 oz)   SpO2 97%   BMI 38.05  kg/m  General: NAD Neck: No JVD, no thyromegaly or thyroid nodule.  Lungs: Clear to auscultation bilaterally with normal respiratory effort. CV: Nondisplaced PMI.  Heart regular S1/S2, no S3/S4, 1/6 SEM RUSB.  No peripheral edema.  No carotid bruit.  Normal pedal pulses.  Abdomen: Soft, nontender, no hepatosplenomegaly, no distention.  Skin: Intact without lesions or rashes.  Neurologic: Alert and oriented x 3.  Psych: Normal affect. Extremities: No clubbing or cyanosis.  HEENT: Normal.   Assessment/Plan: 1. Chronic systolic CHF: Echo in [id number] with EF 40-45%, moderate RV dilation/mildly decreased RV function.  Nonischemic cardiomyopathy, cath in 10/19 without significant coronary disease.  Repeat echo in 9/20 showed fall in EF to 20-25% in the setting of atrial fibrillation and atrial flutter with persistent tachycardia.  He is now s/p St Jude CRT-D and is back in NSR.  He is feeling better, NYHA class II probably.  Not volume overloaded on exam.  - Continue torsemide 80 mg daily.  BMET today.  - Continue spironolactone 25 mg daily.   - Continue Entresto 97/103 bid.    - Now that he has CRT and we do not have to worry as much about bradycardia, will start him on Coreg 3.125 mg bid.  - Repeat echo at followup in 3 months to look for improvement with CRT.  2.  Atrial arrhythmias: Long history of chronic atrial fibrillation, has had slow ventricular response in the past (HR 40s at times). He failed Tikosyn and have avoided amiodarone with bradycardia.  He had Maze procedure in 11/19 but it was not successful.  At initial appointment with me, he was in atypical flutter with HR 110s.  He failed DCCV in 8/20.  He had flutter/fibrillation ablation in 10/20.  Initially after ablation, he was in slow atrial fibrillation (rate upper 30s-50s generally).  He then went into atypical atrial flutter with rate 100s.  However, after CRT-D placement in 2/21, he went into NSR and is currently a-paced.  - He will continue Xarelto.  CBC today.  3. OSA: Continue CPAP.  4. Valvular heart disease: S/p MV and AoV repairs in 11/19.  Valves looked stable on last echo in 9/20.  - He will need antibiotic prophylaxis with dental work.  5. Bradycardia: HR gets down to upper 30s-40s at times prior to CRT implantation.   Followup 3 months with echo.   [name] 09/27/2019

## 2019-09-28 NOTE — Telephone Encounter (Signed)
Lance Corin, MD 1 hour ago (4:02 PM)   My heart rate went up from 64 and is now at 103 steady  I did use a saw all to cut a piece of plastic today I also sent in a report

## 2019-09-28 NOTE — Telephone Encounter (Signed)
Spoke with pt.  Advised manual transmission has not been received as of this moment.  He states event occurred at 1545 today.  Will try again to send transmission/ check for transmission tomorrow.

## 2019-09-29 ENCOUNTER — Telehealth (HOSPITAL_COMMUNITY): Payer: Self-pay

## 2019-09-29 ENCOUNTER — Telehealth: Payer: Self-pay | Admitting: Internal Medicine

## 2019-09-29 ENCOUNTER — Other Ambulatory Visit: Payer: Self-pay

## 2019-09-29 ENCOUNTER — Ambulatory Visit (INDEPENDENT_AMBULATORY_CARE_PROVIDER_SITE_OTHER): Payer: Medicare HMO | Admitting: Student

## 2019-09-29 DIAGNOSIS — I5032 Chronic diastolic (congestive) heart failure: Secondary | ICD-10-CM

## 2019-09-29 DIAGNOSIS — I484 Atypical atrial flutter: Secondary | ICD-10-CM | POA: Diagnosis not present

## 2019-09-29 DIAGNOSIS — I5022 Chronic systolic (congestive) heart failure: Secondary | ICD-10-CM

## 2019-09-29 LAB — CUP PACEART INCLINIC DEVICE CHECK
Battery Remaining Longevity: 62 mo
Brady Statistic RA Percent Paced: 89 %
Brady Statistic RV Percent Paced: 86 %
Date Time Interrogation Session: 20210318162429
HighPow Impedance: 63 Ohm
HighPow Impedance: 63 Ohm
Implantable Lead Implant Date: 20210209
Implantable Lead Implant Date: 20210209
Implantable Lead Implant Date: 20210209
Implantable Lead Location: 753858
Implantable Lead Location: 753859
Implantable Lead Location: 753860
Implantable Pulse Generator Implant Date: 20210209
Lead Channel Impedance Value: 462.5 Ohm
Lead Channel Impedance Value: 462.5 Ohm
Lead Channel Impedance Value: 462.5 Ohm
Lead Channel Impedance Value: 462.5 Ohm
Lead Channel Impedance Value: 737.5 Ohm
Lead Channel Impedance Value: 737.5 Ohm
Lead Channel Pacing Threshold Amplitude: 0.5 V
Lead Channel Pacing Threshold Amplitude: 0.5 V
Lead Channel Pacing Threshold Amplitude: 0.75 V
Lead Channel Pacing Threshold Amplitude: 0.75 V
Lead Channel Pacing Threshold Amplitude: 1.25 V
Lead Channel Pacing Threshold Amplitude: 1.25 V
Lead Channel Pacing Threshold Amplitude: 1.25 V
Lead Channel Pacing Threshold Amplitude: 1.25 V
Lead Channel Pacing Threshold Pulse Width: 0.5 ms
Lead Channel Pacing Threshold Pulse Width: 0.5 ms
Lead Channel Pacing Threshold Pulse Width: 0.5 ms
Lead Channel Pacing Threshold Pulse Width: 0.5 ms
Lead Channel Pacing Threshold Pulse Width: 1 ms
Lead Channel Pacing Threshold Pulse Width: 1 ms
Lead Channel Pacing Threshold Pulse Width: 1 ms
Lead Channel Pacing Threshold Pulse Width: 1 ms
Lead Channel Sensing Intrinsic Amplitude: 12 mV
Lead Channel Sensing Intrinsic Amplitude: 12 mV
Lead Channel Sensing Intrinsic Amplitude: 4.9 mV
Lead Channel Sensing Intrinsic Amplitude: 4.9 mV
Lead Channel Setting Pacing Amplitude: 1.5 V
Lead Channel Setting Pacing Amplitude: 1.75 V
Lead Channel Setting Pacing Amplitude: 3.5 V
Lead Channel Setting Pacing Pulse Width: 0.5 ms
Lead Channel Setting Pacing Pulse Width: 1 ms
Lead Channel Setting Sensing Sensitivity: 0.5 mV
Pulse Gen Serial Number: 111006442

## 2019-09-29 MED ORDER — CARVEDILOL 6.25 MG PO TABS: 6.2500 mg | ORAL_TABLET | Freq: Two times a day (BID) | ORAL | 3 refills | Status: DC

## 2019-09-29 NOTE — Progress Notes (Signed)
CRT-D check in office for "not feeling right." and could not get transmission to send (confirmed this is an issue on [employer]'s end they are actively working on fixing). Pt in Atrial flutter on presentation with V rate 106-110. RV/LV Thresholds and sensing consistent with previous device measurements. Lead impedance trends stable over time. AFL since yesterday. One other MS 2/24 that lasted about 6 hours and broke spontaneously.  Under direction from Dr. Rayann Heman, ANIPS attempted. AFL cycle length of 285 ms. Attempted to pace out patient from 270 ms down to 180 ms with no change in rhythm.  Pt just started coreg yesterday (took first dose this am). bi-ventricularly pacing 86% of the time in setting of AFL. Device programmed with appropriate safety margins. Heart failure diagnostics reviewed and trends are stable for patient for now.  Pt refused DCCV consideration. Coreg increased to 6.25 mg BID. Pt requests call back to consider DCCV at a later date. He is not interesting in discussing eventual AV nodal ablation at this time.

## 2019-09-29 NOTE — Telephone Encounter (Signed)
Patient states that he sent in a transmission yesterday but it was not received. He says that he was told yesterday to try to send a manuel transmission and will receive a call back today to let them know if it was received this time. He has not gotten a call yet and would like me to inform the office that he is really upset.

## 2019-09-29 NOTE — Telephone Encounter (Signed)
Patient copncerned because his watch shows a consistently elevated HR at around 103 bpm and he has experienced intermittant SOB with activity since 09/28/19. He reports he has sent  transmissions using the [facility] monitoring app on his phone on 09/28/19 and 09/29/19 and has not had a response concerning the transmissions. Patient's transmission were not received and he sent another transmission at the time of this call that gave him the message that it was sent but the transmission has not come through on the [employer]. Patient would like to come in to the office to verify his pacemaker is functioning properly. No CP, no sob, no dizziness, and no syncope reported. Patient given [employer] tech support # to contact them to assess and trouble shoot any issues with the monitoring App. Patient asked to call office after he speaks with [facility] tech support and let us know if transmission sent or what result is from call. Reassured patient that if transmission can not be sent then we will arrange for him to come into device clinic to check the device.

## 2019-09-29 NOTE — Telephone Encounter (Signed)
[employer] informed patient that their system was having difficulties in sending transmission to the website to view. Patient scheduled for device check today.

## 2019-09-29 NOTE — Telephone Encounter (Signed)
-----   Message from Larey Dresser, MD sent at 09/27/2019  4:19 PM EDT ----- Platelets low, need to follow over time.  Repeat CBC in 2 wks.

## 2019-10-07 ENCOUNTER — Other Ambulatory Visit (HOSPITAL_COMMUNITY): Payer: Medicare HMO

## 2019-10-14 ENCOUNTER — Ambulatory Visit (HOSPITAL_COMMUNITY)
Admission: RE | Admit: 2019-10-14 | Discharge: 2019-10-14 | Disposition: A | Discharge status: Home / Self Care | Payer: Medicare HMO | Source: Ambulatory Visit | Attending: Internal Medicine | Admitting: Internal Medicine

## 2019-10-14 ENCOUNTER — Other Ambulatory Visit: Payer: Self-pay

## 2019-10-14 DIAGNOSIS — I5032 Chronic diastolic (congestive) heart failure: Secondary | ICD-10-CM | POA: Diagnosis not present

## 2019-10-14 LAB — CBC
HCT: 42.4 % (ref 39.0–52.0)
Hemoglobin: 14.1 g/dL (ref 13.0–17.0)
MCH: 29.1 pg (ref 26.0–34.0)
MCHC: 33.3 g/dL (ref 30.0–36.0)
MCV: 87.6 fL (ref 80.0–100.0)
Platelets: 132 10*3/uL — ABNORMAL LOW (ref 150–400)
RBC: 4.84 MIL/uL (ref 4.22–5.81)
RDW: 13.8 % (ref 11.5–15.5)
WBC: 8.4 10*3/uL (ref 4.0–10.5)
nRBC: 0 % (ref 0.0–0.2)

## 2019-10-19 ENCOUNTER — Other Ambulatory Visit: Payer: Self-pay | Admitting: Internal Medicine

## 2019-10-19 NOTE — Telephone Encounter (Signed)
Pt last saw Barrington Ellison, [place] on 09/29/19, last labs 09/27/19 Creat 1.32, age 69, weight 120.3kg, CrCl 93.67, based on CrCl pt is on appropriate dosage of Xarelto 20mg  QD.  Will refill rx.

## 2019-11-17 DIAGNOSIS — M25562 Pain in left knee: Secondary | ICD-10-CM | POA: Diagnosis not present

## 2019-11-17 DIAGNOSIS — S83411D Sprain of medial collateral ligament of right knee, subsequent encounter: Secondary | ICD-10-CM | POA: Diagnosis not present

## 2019-11-17 DIAGNOSIS — M25551 Pain in right hip: Secondary | ICD-10-CM | POA: Diagnosis not present

## 2019-11-22 ENCOUNTER — Ambulatory Visit (INDEPENDENT_AMBULATORY_CARE_PROVIDER_SITE_OTHER): Payer: Medicare HMO | Admitting: *Deleted

## 2019-11-22 DIAGNOSIS — I428 Other cardiomyopathies: Secondary | ICD-10-CM

## 2019-11-23 ENCOUNTER — Telehealth: Payer: Self-pay

## 2019-11-23 ENCOUNTER — Encounter: Payer: Self-pay | Admitting: Internal Medicine

## 2019-11-23 ENCOUNTER — Ambulatory Visit: Payer: Medicare HMO | Admitting: Internal Medicine

## 2019-11-23 ENCOUNTER — Other Ambulatory Visit: Payer: Self-pay

## 2019-11-23 VITALS — BP 104/68 | HR 105 | Ht 70.0 in | Wt 265.0 lb

## 2019-11-23 DIAGNOSIS — D6869 Other thrombophilia: Secondary | ICD-10-CM | POA: Diagnosis not present

## 2019-11-23 DIAGNOSIS — I484 Atypical atrial flutter: Secondary | ICD-10-CM

## 2019-11-23 DIAGNOSIS — Z9889 Other specified postprocedural states: Secondary | ICD-10-CM | POA: Diagnosis not present

## 2019-11-23 DIAGNOSIS — I4819 Other persistent atrial fibrillation: Secondary | ICD-10-CM

## 2019-11-23 DIAGNOSIS — I5022 Chronic systolic (congestive) heart failure: Secondary | ICD-10-CM | POA: Diagnosis not present

## 2019-11-23 DIAGNOSIS — Z8679 Personal history of other diseases of the circulatory system: Secondary | ICD-10-CM

## 2019-11-23 DIAGNOSIS — I428 Other cardiomyopathies: Secondary | ICD-10-CM | POA: Diagnosis not present

## 2019-11-23 LAB — CUP PACEART REMOTE DEVICE CHECK
Battery Remaining Longevity: 80 mo
Battery Remaining Percentage: 92 %
Battery Voltage: 2.99 V
Brady Statistic AP VP Percent: 0 %
Brady Statistic AP VS Percent: 0 %
Brady Statistic AS VP Percent: 92 %
Brady Statistic AS VS Percent: 8.3 %
Brady Statistic RA Percent Paced: 1 %
Date Time Interrogation Session: 20210512094352
HighPow Impedance: 69 Ohm
Implantable Lead Implant Date: 20210209
Implantable Lead Implant Date: 20210209
Implantable Lead Implant Date: 20210209
Implantable Lead Location: 753858
Implantable Lead Location: 753859
Implantable Lead Location: 753860
Implantable Pulse Generator Implant Date: 20210209
Lead Channel Impedance Value: 560 Ohm
Lead Channel Impedance Value: 560 Ohm
Lead Channel Impedance Value: 750 Ohm
Lead Channel Pacing Threshold Amplitude: 0.5 V
Lead Channel Pacing Threshold Amplitude: 0.75 V
Lead Channel Pacing Threshold Amplitude: 1.25 V
Lead Channel Pacing Threshold Pulse Width: 0.5 ms
Lead Channel Pacing Threshold Pulse Width: 0.5 ms
Lead Channel Pacing Threshold Pulse Width: 1 ms
Lead Channel Sensing Intrinsic Amplitude: 12 mV
Lead Channel Sensing Intrinsic Amplitude: 4.9 mV
Lead Channel Setting Pacing Amplitude: 1.5 V
Lead Channel Setting Pacing Amplitude: 1.75 V
Lead Channel Setting Pacing Amplitude: 3.5 V
Lead Channel Setting Pacing Pulse Width: 0.5 ms
Lead Channel Setting Pacing Pulse Width: 1 ms
Lead Channel Setting Sensing Sensitivity: 0.5 mV
Pulse Gen Serial Number: 111006442

## 2019-11-23 MED ORDER — TORSEMIDE 20 MG PO TABS: 40.0000 mg | ORAL_TABLET | Freq: Every day | ORAL | 3 refills | Status: DC

## 2019-11-23 NOTE — H&P (View-Only) (Signed)
PCP: Seward Carol, MD Primary Cardiologist: [name] Primary EP: Dr Rayann Heman  [name] is a 69 y.o. male who presents today for routine electrophysiology followup.  Since last being seen in our clinic, the patient reports doing very well.  He is active.  He feels that he is dehydrated with his current torsemide.  + postural dizziness.  Also has palpitations related to his afib.  Today, he denies symptoms of  chest pain, shortness of breath,  lower extremity edema presyncope, syncope, or ICD shocks.  The patient is otherwise without complaint today.   Past Medical History:  Diagnosis Date  . Aortic insufficiency 04/21/2018  . Aortic insufficiency   . Arthritis    "joints" (09/26/2013)  . Atrial enlargement, left   . Dyspnea    with exertion  . Hearing aid worn    both ears  . Hypertension   . Mitral regurgitation 04/21/2018  . Obesity   . OSA on CPAP    "mask adjusts to what I need" (09/26/2013)  . Persistent atrial fibrillation ([place])    cardioversion 06/06/13  . PONV (postoperative nausea and vomiting)   . Rotator cuff tear, right dec 2012   physical therapy done, decreased strength  . S/P aortic valve repair 05/18/2018   Complex valvuloplasty including plication of right coronary leaflet and 21 mm Biostable HAART annuloplasty ring  . S/P Maze operation for atrial fibrillation 05/18/2018   Complete bilateral atrial lesion set using bipolar radiofrequency and cryothermy ablation with clipping of LA appendage  . S/P mitral valve repair 05/18/2018   Complex valvuloplasty including artificial Gore-tex neochord placement x4 and 30 mm Sorin Memo 4D ring annuloplasty  . Wears glasses    Past Surgical History:  Procedure Laterality Date  . AORTIC VALVE REPAIR N/A 05/18/2018   Procedure: AORTIC VALVE REPAIR using HAART 300 Aortic Annuloplasty Device size 40mm;  Surgeon: Rexene Alberts, MD;  Location: [place];  Service: Open Heart Surgery;  Laterality: N/A;  . ATRIAL FIBRILLATION  ABLATION N/A 04/26/2019   Procedure: ATRIAL FIBRILLATION ABLATION;  Surgeon: Thompson Grayer, MD;  Location: [place] [facility];  Service: Cardiovascular;  Laterality: N/A;  . BIV ICD INSERTION CRT-D N/A 08/23/2019   Procedure: BIV ICD INSERTION CRT-D;  Surgeon: Thompson Grayer, MD;  Location: [place] [facility];  Service: Cardiovascular;  Laterality: N/A;  . CARDIOVERSION N/A 06/06/2013   Procedure: CARDIOVERSION;  Surgeon: Sanda Klein, MD;  Location: [place] [facility];  Service: Cardiovascular;  Laterality: N/A;  . CARDIOVERSION N/A 09/28/2013   Procedure: CARDIOVERSION BEDSIDE;  Surgeon: Peter M Martinique, MD;  Location: [place];  Service: Cardiovascular;  Laterality: N/A;  . CARDIOVERSION N/A 11/30/2018   Procedure: CARDIOVERSION;  Surgeon: Dorothy Spark, MD;  Location: [facility];  Service: Cardiovascular;  Laterality: N/A;  . CARDIOVERSION N/A 03/10/2019   Procedure: CARDIOVERSION;  Surgeon: [name], MD;  Location: [place];  Service: Endoscopy;  Laterality: N/A;  . CARPAL TUNNEL RELEASE Right 08/08/2013   Procedure: RIGHT CARPAL TUNNEL RELEASE;  Surgeon: Wynonia Sours, MD;  Location: [place];  Service: Orthopedics;  Laterality: Right;  . CARPAL TUNNEL RELEASE Left 2008  . CLIPPING OF ATRIAL APPENDAGE  05/18/2018   Procedure: CLIPPING OF ATRIAL APPENDAGE using AtriCure Clip size 45;  Surgeon: Rexene Alberts, MD;  Location: [place];  Service: Open Heart Surgery;;  . KNEE ARTHROSCOPY Right 1990's  . MAZE N/A 05/18/2018   Procedure: MAZE;  Surgeon: Rexene Alberts, MD;  Location:  [place] [facility];  Service: Open Heart Surgery;  Laterality: N/A;  . MITRAL VALVE REPAIR N/A 05/18/2018   Procedure: MITRAL VALVE REPAIR (MVR) using 4D Memo Ring size 30;  Surgeon: Rexene Alberts, MD;  Location: [place];  Service: Open Heart Surgery;  Laterality: N/A;  . RIGHT/LEFT HEART CATH AND CORONARY ANGIOGRAPHY N/A 04/21/2018   Procedure: RIGHT/LEFT HEART CATH AND CORONARY ANGIOGRAPHY;  Surgeon:  Nelva Bush, MD;  Location: [place] [facility];  Service: Cardiovascular;  Laterality: N/A;  . SHOULDER OPEN ROTATOR CUFF REPAIR Left 1990's  . SHOULDER OPEN ROTATOR CUFF REPAIR Right 11/2007  . TEE WITHOUT CARDIOVERSION N/A 04/21/2018   Procedure: TRANSESOPHAGEAL ECHOCARDIOGRAM (TEE);  Surgeon: Jerline Pain, MD;  Location: [facility]COPY;  Service: Cardiovascular;  Laterality: N/A;  . TEE WITHOUT CARDIOVERSION N/A 05/18/2018   Procedure: TRANSESOPHAGEAL ECHOCARDIOGRAM (TEE);  Surgeon: Rexene Alberts, MD;  Location: [place];  Service: Open Heart Surgery;  Laterality: N/A;  . TONSILLECTOMY  1950's  . TOTAL HIP ARTHROPLASTY Left 2010  . TOTAL HIP REVISION Left 10/08/2011  . TOTAL HIP REVISION  04/09/2012   Procedure: TOTAL HIP REVISION;  Surgeon: Gearlean Alf, MD;  Location: [facility];  Service: Orthopedics;  Laterality: Left;  Left Hip Acetabular Revision vs Constrained Liner  . TOTAL KNEE ARTHROPLASTY Right 2006   . TRIGGER FINGER RELEASE Right 08/08/2013   Procedure: RELEASE STENOSING TENOSYNOVITIS RIGHT THUMB;  Surgeon: Wynonia Sours, MD;  Location: [place];  Service: Orthopedics;  Laterality: Right;    ROS- all systems are reviewed and negative except as per HPI above  Current Outpatient Medications  Medication Sig Dispense Refill  . carvedilol (COREG) 6.25 MG tablet Take 1 tablet (6.25 mg total) by mouth 2 (two) times daily. 180 tablet 3  . clobetasol ointment (TEMOVATE) [id number] % Apply 1 application topically daily as needed (rash).    Marland Kitchen HYDROcodone-acetaminophen (NORCO/VICODIN) 5-325 MG tablet Take 1 tablet by mouth every 12 (twelve) hours as needed for moderate pain. 5 tablet 0  . KLOR-CON M20 20 MEQ tablet Take 1 tablet (20 mEq total) by mouth 2 (two) times daily. 60 tablet 11  . Multiple Vitamin (MULTIVITAMIN WITH MINERALS) TABS tablet Take 1 tablet by mouth 2 (two) times a week.    . Naproxen Sod-diphenhydrAMINE (ALEVE PM) 220-25 MG TABS Take 2 tablets by mouth at  bedtime as needed (sleep/pain).    . Omega-3 Fatty Acids (FISH OIL PO) Take 1 capsule by mouth 4 (four) times a week.     Marland Kitchen OVER THE COUNTER MEDICATION Apply 1 spray topically daily as needed (cramps). Topical Magnesium Spray    . sacubitril-valsartan (ENTRESTO) 97-103 MG Take 1 tablet by mouth 2 (two) times daily. 60 tablet 6  . spironolactone (ALDACTONE) 25 MG tablet TAKE 1 TABLET BY MOUTH EVERY DAY 90 tablet 1  . torsemide (DEMADEX) 20 MG tablet Take 4 tablets (80 mg total) by mouth daily. 120 tablet 6  . XARELTO 20 MG TABS tablet TAKE ONE TABLET BY MOUTH DAILY WITH SUPPER 90 tablet 2   No current facility-administered medications for this visit.    Physical Exam: Vitals:   11/23/19 1356  BP: 104/68  Pulse: (!) 105  SpO2: 98%  Weight: 265 lb (120.2 kg)  Height: 5\' 10"  (1.778 m)    GEN- The patient is well appearing, alert and oriented x 3 today.   Head- normocephalic, atraumatic Eyes-  Sclera clear, conjunctiva pink Ears- hearing intact Oropharynx- clear Lungs-  normal work  of breathing Chest- ICD pocket is well healed Heart- tachycardic irregular rhythm  GI- soft, NT, ND, + BS Extremities- no clubbing, cyanosis, or edema  ICD interrogation- reviewed in detail today,  See PACEART report  ekg tracing ordered today is personally reviewed and shows afib, V paced with RVR  Wt Readings from Last 3 Encounters:  11/23/19 265 lb (120.2 kg)  09/27/19 265 lb 3.2 oz (120.3 kg)  09/21/19 259 lb (117.5 kg)    Assessment and Plan:  1.  Chronic systolic dysfunction/ nonischemic CM/ LBBB Dry today Not adequately BiV paced due to RVR reduce torsemide to 40mg  daily Daily weights advised Normal ICD function See Pace Art report No changes today he is not device dependant today followed in ICM device clinic  2. Persistent afib/ atypical atrial flutter Continues to have persistent atrial arrhythmias despite maze and ablation. We discussed options at length today.  He has no real  AAD options. Options are to try another ablation (with low anticipated success rates) or AV nodal ablation.  I have strongly advised AV nodal ablation He wishes to think about this further.  I have offered tertiary referral for further discussions. He will contact my office if he decides to proceed with AV nodal ablation.  3. Hypertensive cardiovascular disease Stable No change required today  Risks, benefits and potential toxicities for medications prescribed and/or refilled reviewed with patient today.   Return to see me in 3 months unless he decides to proceed with AV nodal ablation.  Thompson Grayer MD, [facility] 11/23/2019 1:58 PM

## 2019-11-23 NOTE — Telephone Encounter (Signed)
Spoke with patient to remind of missed remote transmission 

## 2019-11-23 NOTE — Patient Instructions (Addendum)
Medication Instructions:  Your physician has recommended you make the following change in your medication:  1. DECREASE Torsemide to 40 mg once daily  *If you need a refill on your cardiac medications before your next appointment, please call your pharmacy*   Lab Work: None ordered If you have labs (blood work) drawn today and your tests are completely normal, you will receive your results only by: Marland Kitchen MyChart Message (if you have MyChart) OR . A paper copy in the mail If you have any lab test that is abnormal or we need to change your treatment, we will call you to review the results.   Testing/Procedures: None ordered   Follow-Up: Remote monitoring is used to monitor your Pacemaker of ICD from home. This monitoring reduces the number of office visits required to check your device to one time per year. It allows [place] to keep an eye on the functioning of your device to ensure it is working properly. You are scheduled for a device check from home on 02/21/2020. You may send your transmission at any time that day. If you have a wireless device, the transmission will be sent automatically. After your physician reviews your transmission, you will receive a postcard with your next transmission date.  At [facility], you and your health needs are our priority.  As part of our continuing mission to provide you with exceptional heart care, we have created designated Provider Care Teams.  These Care Teams include your primary Cardiologist (physician) and Advanced Practice Providers (APPs -  Physician Assistants and Nurse Practitioners) who all work together to provide you with the care you need, when you need it.  We recommend signing up for the patient portal called "MyChart".  Sign up information is provided on this After Visit Summary.  MyChart is used to connect with patients for Virtual Visits (Telemedicine).  Patients are able to view lab/test results, encounter notes, upcoming appointments, etc.   Non-urgent messages can be sent to your provider as well.   To learn more about what you can do with MyChart, go to [email or web address].    Your next appointment:   3 month(s)  The format for your next appointment:   In Person  Provider:   Thompson Grayer, MD   Thank you for choosing [facility]!!     Other Instructions   Cardiac Ablation Cardiac ablation is a procedure to disable (ablate) a small amount of heart tissue in very specific places. The heart has many electrical connections. Sometimes these connections are abnormal and can cause the heart to beat very fast or irregularly. Ablating some of the problem areas can improve the heart rhythm or return it to normal. Ablation may be done for people who:  Have Wolff-Parkinson-White syndrome.  Have fast heart rhythms (tachycardia).  Have taken medicines for an abnormal heart rhythm (arrhythmia) that were not effective or caused side effects.  Have a high-risk heartbeat that may be life-threatening. During the procedure, a small incision is made in the neck or the groin, and a long, thin, flexible tube (catheter) is inserted into the incision and moved to the heart. Small devices (electrodes) on the tip of the catheter will send out electrical currents. A type of X-ray (fluoroscopy) will be used to help guide the catheter and to provide images of the heart. Tell a health care provider about:  Any allergies you have.  All medicines you are taking, including vitamins, herbs, eye drops, creams, and over-the-counter medicines.  Any problems you or  family members have had with anesthetic medicines.  Any blood disorders you have.  Any surgeries you have had.  Any medical conditions you have, such as kidney failure.  Whether you are pregnant or may be pregnant. What are the risks? Generally, this is a safe procedure. However, problems may occur, including:  Infection.  Bruising and bleeding at the catheter insertion  site.  Bleeding into the chest, especially into the sac that surrounds the heart. This is a serious complication.  Stroke or blood clots.  Damage to other structures or organs.  Allergic reaction to medicines or dyes.  Need for a permanent pacemaker if the normal electrical system is damaged. A pacemaker is a small computer that sends electrical signals to the heart and helps your heart beat normally.  The procedure not being fully effective. This may not be recognized until months later. Repeat ablation procedures are sometimes required. What happens before the procedure?  Follow instructions from your health care provider about eating or drinking restrictions.  Ask your health care provider about: ? Changing or stopping your regular medicines. This is especially important if you are taking diabetes medicines or blood thinners. ? Taking medicines such as aspirin and ibuprofen. These medicines can thin your blood. Do not take these medicines before your procedure if your health care provider instructs you not to.  Plan to have someone take you home from the hospital or clinic.  If you will be going home right after the procedure, plan to have someone with you for 24 hours. What happens during the procedure?  To lower your risk of infection: ? Your health care team will wash or sanitize their hands. ? Your skin will be washed with soap. ? Hair may be removed from the incision area.  An IV tube will be inserted into one of your veins.  You will be given a medicine to help you relax (sedative).  The skin on your neck or groin will be numbed.  An incision will be made in your neck or your groin.  A needle will be inserted through the incision and into a large vein in your neck or groin.  A catheter will be inserted into the needle and moved to your heart.  Dye may be injected through the catheter to help your surgeon see the area of the heart that needs treatment.  Electrical  currents will be sent from the catheter to ablate heart tissue in desired areas. There are three types of energy that may be used to ablate heart tissue: ? Heat (radiofrequency energy). ? Laser energy. ? Extreme cold (cryoablation).  When the necessary tissue has been ablated, the catheter will be removed.  Pressure will be held on the catheter insertion area to prevent excessive bleeding.  A bandage (dressing) will be placed over the catheter insertion area. The procedure may vary among health care providers and hospitals. What happens after the procedure?  Your blood pressure, heart rate, breathing rate, and blood oxygen level will be monitored until the medicines you were given have worn off.  Your catheter insertion area will be monitored for bleeding. You will need to lie still for a few hours to ensure that you do not bleed from the catheter insertion area.  Do not drive for 24 hours or as long as directed by your health care provider. Summary  Cardiac ablation is a procedure to disable (ablate) a small amount of heart tissue in very specific places. Ablating some of the problem areas  can improve the heart rhythm or return it to normal.  During the procedure, electrical currents will be sent from the catheter to ablate heart tissue in desired areas. This information is not intended to replace advice given to you by your health care provider. Make sure you discuss any questions you have with your health care provider. Document Revised: 12/21/2017 Document Reviewed: 05/19/2016 Elsevier Patient Education  [place].

## 2019-11-23 NOTE — Progress Notes (Signed)
PCP: Seward Carol, MD Primary Cardiologist: [name] Primary EP: Dr Rayann Heman  [name] is a 69 y.o. male who presents today for routine electrophysiology followup.  Since last being seen in our clinic, the patient reports doing very well.  He is active.  He feels that he is dehydrated with his current torsemide.  + postural dizziness.  Also has palpitations related to his afib.  Today, he denies symptoms of  chest pain, shortness of breath,  lower extremity edema presyncope, syncope, or ICD shocks.  The patient is otherwise without complaint today.   Past Medical History:  Diagnosis Date  . Aortic insufficiency 04/21/2018  . Aortic insufficiency   . Arthritis    "joints" (09/26/2013)  . Atrial enlargement, left   . Dyspnea    with exertion  . Hearing aid worn    both ears  . Hypertension   . Mitral regurgitation 04/21/2018  . Obesity   . OSA on CPAP    "mask adjusts to what I need" (09/26/2013)  . Persistent atrial fibrillation ([place])    cardioversion 06/06/13  . PONV (postoperative nausea and vomiting)   . Rotator cuff tear, right dec 2012   physical therapy done, decreased strength  . S/P aortic valve repair 05/18/2018   Complex valvuloplasty including plication of right coronary leaflet and 21 mm Biostable HAART annuloplasty ring  . S/P Maze operation for atrial fibrillation 05/18/2018   Complete bilateral atrial lesion set using bipolar radiofrequency and cryothermy ablation with clipping of LA appendage  . S/P mitral valve repair 05/18/2018   Complex valvuloplasty including artificial Gore-tex neochord placement x4 and 30 mm Sorin Memo 4D ring annuloplasty  . Wears glasses    Past Surgical History:  Procedure Laterality Date  . AORTIC VALVE REPAIR N/A 05/18/2018   Procedure: AORTIC VALVE REPAIR using HAART 300 Aortic Annuloplasty Device size 20mm;  Surgeon: Rexene Alberts, MD;  Location: [facility];  Service: Open Heart Surgery;  Laterality: N/A;  . ATRIAL FIBRILLATION  ABLATION N/A 04/26/2019   Procedure: ATRIAL FIBRILLATION ABLATION;  Surgeon: [place] Grayer, MD;  Location: [facility];  Service: Cardiovascular;  Laterality: N/A;  . BIV ICD INSERTION CRT-D N/A 08/23/2019   Procedure: BIV ICD INSERTION CRT-D;  Surgeon: [place] Grayer, MD;  Location: [facility];  Service: Cardiovascular;  Laterality: N/A;  . CARDIOVERSION N/A 06/06/2013   Procedure: CARDIOVERSION;  Surgeon: Sanda Klein, MD;  Location: [facility];  Service: Cardiovascular;  Laterality: N/A;  . CARDIOVERSION N/A 09/28/2013   Procedure: CARDIOVERSION BEDSIDE;  Surgeon: Peter M Martinique, MD;  Location: [place];  Service: Cardiovascular;  Laterality: N/A;  . CARDIOVERSION N/A 11/30/2018   Procedure: CARDIOVERSION;  Surgeon: Dorothy Spark, MD;  Location: [facility];  Service: Cardiovascular;  Laterality: N/A;  . CARDIOVERSION N/A 03/10/2019   Procedure: CARDIOVERSION;  Surgeon: [name], MD;  Location: [facility];  Service: Endoscopy;  Laterality: N/A;  . CARPAL TUNNEL RELEASE Right 08/08/2013   Procedure: RIGHT CARPAL TUNNEL RELEASE;  Surgeon: Wynonia Sours, MD;  Location: [place];  Service: Orthopedics;  Laterality: Right;  . CARPAL TUNNEL RELEASE Left 2008  . CLIPPING OF ATRIAL APPENDAGE  05/18/2018   Procedure: CLIPPING OF ATRIAL APPENDAGE using AtriCure Clip size 45;  Surgeon: Rexene Alberts, MD;  Location: [place];  Service: Open Heart Surgery;;  . KNEE ARTHROSCOPY Right 1990's  . MAZE N/A 05/18/2018   Procedure: MAZE;  Surgeon: Rexene Alberts, MD;  Location:  [place] [facility];  Service: Open Heart Surgery;  Laterality: N/A;  . MITRAL VALVE REPAIR N/A 05/18/2018   Procedure: MITRAL VALVE REPAIR (MVR) using 4D Memo Ring size 30;  Surgeon: Rexene Alberts, MD;  Location: [place];  Service: Open Heart Surgery;  Laterality: N/A;  . RIGHT/LEFT HEART CATH AND CORONARY ANGIOGRAPHY N/A 04/21/2018   Procedure: RIGHT/LEFT HEART CATH AND CORONARY ANGIOGRAPHY;  Surgeon:  Nelva Bush, MD;  Location: [place] [facility];  Service: Cardiovascular;  Laterality: N/A;  . SHOULDER OPEN ROTATOR CUFF REPAIR Left 1990's  . SHOULDER OPEN ROTATOR CUFF REPAIR Right 11/2007  . TEE WITHOUT CARDIOVERSION N/A 04/21/2018   Procedure: TRANSESOPHAGEAL ECHOCARDIOGRAM (TEE);  Surgeon: Jerline Pain, MD;  Location: [facility];  Service: Cardiovascular;  Laterality: N/A;  . TEE WITHOUT CARDIOVERSION N/A 05/18/2018   Procedure: TRANSESOPHAGEAL ECHOCARDIOGRAM (TEE);  Surgeon: Rexene Alberts, MD;  Location: [place];  Service: Open Heart Surgery;  Laterality: N/A;  . TONSILLECTOMY  1950's  . TOTAL HIP ARTHROPLASTY Left 2010  . TOTAL HIP REVISION Left 10/08/2011  . TOTAL HIP REVISION  04/09/2012   Procedure: TOTAL HIP REVISION;  Surgeon: Gearlean Alf, MD;  Location: [facility];  Service: Orthopedics;  Laterality: Left;  Left Hip Acetabular Revision vs Constrained Liner  . TOTAL KNEE ARTHROPLASTY Right 2006   . TRIGGER FINGER RELEASE Right 08/08/2013   Procedure: RELEASE STENOSING TENOSYNOVITIS RIGHT THUMB;  Surgeon: Wynonia Sours, MD;  Location: [place];  Service: Orthopedics;  Laterality: Right;    ROS- all systems are reviewed and negative except as per HPI above  Current Outpatient Medications  Medication Sig Dispense Refill  . carvedilol (COREG) 6.25 MG tablet Take 1 tablet (6.25 mg total) by mouth 2 (two) times daily. 180 tablet 3  . clobetasol ointment (TEMOVATE) [id number] % Apply 1 application topically daily as needed (rash).    Marland Kitchen HYDROcodone-acetaminophen (NORCO/VICODIN) 5-325 MG tablet Take 1 tablet by mouth every 12 (twelve) hours as needed for moderate pain. 5 tablet 0  . KLOR-CON M20 20 MEQ tablet Take 1 tablet (20 mEq total) by mouth 2 (two) times daily. 60 tablet 11  . Multiple Vitamin (MULTIVITAMIN WITH MINERALS) TABS tablet Take 1 tablet by mouth 2 (two) times a week.    . Naproxen Sod-diphenhydrAMINE (ALEVE PM) 220-25 MG TABS Take 2 tablets by mouth at  bedtime as needed (sleep/pain).    . Omega-3 Fatty Acids (FISH OIL PO) Take 1 capsule by mouth 4 (four) times a week.     Marland Kitchen OVER THE COUNTER MEDICATION Apply 1 spray topically daily as needed (cramps). Topical Magnesium Spray    . sacubitril-valsartan (ENTRESTO) 97-103 MG Take 1 tablet by mouth 2 (two) times daily. 60 tablet 6  . spironolactone (ALDACTONE) 25 MG tablet TAKE 1 TABLET BY MOUTH EVERY DAY 90 tablet 1  . torsemide (DEMADEX) 20 MG tablet Take 4 tablets (80 mg total) by mouth daily. 120 tablet 6  . XARELTO 20 MG TABS tablet TAKE ONE TABLET BY MOUTH DAILY WITH SUPPER 90 tablet 2   No current facility-administered medications for this visit.    Physical Exam: Vitals:   11/23/19 1356  BP: 104/68  Pulse: (!) 105  SpO2: 98%  Weight: 265 lb (120.2 kg)  Height: 5\' 10"  (1.778 m)    GEN- The patient is well appearing, alert and oriented x 3 today.   Head- normocephalic, atraumatic Eyes-  Sclera clear, conjunctiva pink Ears- hearing intact Oropharynx- clear Lungs-  normal work  of breathing Chest- ICD pocket is well healed Heart- tachycardic irregular rhythm  GI- soft, NT, ND, + BS Extremities- no clubbing, cyanosis, or edema  ICD interrogation- reviewed in detail today,  See PACEART report  ekg tracing ordered today is personally reviewed and shows afib, V paced with RVR  Wt Readings from Last 3 Encounters:  11/23/19 265 lb (120.2 kg)  09/27/19 265 lb 3.2 oz (120.3 kg)  09/21/19 259 lb (117.5 kg)    Assessment and Plan:  1.  Chronic systolic dysfunction/ nonischemic CM/ LBBB Dry today Not adequately BiV paced due to RVR reduce torsemide to 40mg  daily Daily weights advised Normal ICD function See Pace Art report No changes today he is not device dependant today followed in ICM device clinic  2. Persistent afib/ atypical atrial flutter Continues to have persistent atrial arrhythmias despite maze and ablation. We discussed options at length today.  He has no real  AAD options. Options are to try another ablation (with low anticipated success rates) or AV nodal ablation.  I have strongly advised AV nodal ablation He wishes to think about this further.  I have offered tertiary referral for further discussions. He will contact my office if he decides to proceed with AV nodal ablation.  3. Hypertensive cardiovascular disease Stable No change required today  Risks, benefits and potential toxicities for medications prescribed and/or refilled reviewed with patient today.   Return to see me in 3 months unless he decides to proceed with AV nodal ablation.  [place] Grayer MD, [facility] 11/23/2019 1:58 PM

## 2019-11-24 NOTE — Addendum Note (Signed)
Addended by: Rose Phi on: 11/24/2019 01:26 PM   Modules accepted: Orders

## 2019-11-24 NOTE — Progress Notes (Signed)
Remote ICD transmission.   

## 2019-11-29 LAB — CUP PACEART INCLINIC DEVICE CHECK
Battery Remaining Longevity: 97 mo
Brady Statistic RA Percent Paced: 1 % — CL
Brady Statistic RV Percent Paced: 7.6 %
Date Time Interrogation Session: 20210512143713
HighPow Impedance: 72 Ohm
Implantable Lead Implant Date: 20210209
Implantable Lead Implant Date: 20210209
Implantable Lead Implant Date: 20210209
Implantable Lead Location: 753858
Implantable Lead Location: 753859
Implantable Lead Location: 753860
Implantable Pulse Generator Implant Date: 20210209
Lead Channel Impedance Value: 500 Ohm
Lead Channel Impedance Value: 525 Ohm
Lead Channel Impedance Value: 737.5 Ohm
Lead Channel Pacing Threshold Amplitude: 0.75 V
Lead Channel Pacing Threshold Amplitude: 0.75 V
Lead Channel Pacing Threshold Amplitude: 1.25 V
Lead Channel Pacing Threshold Amplitude: 1.25 V
Lead Channel Pacing Threshold Pulse Width: 0.5 ms
Lead Channel Pacing Threshold Pulse Width: 0.5 ms
Lead Channel Pacing Threshold Pulse Width: 1 ms
Lead Channel Pacing Threshold Pulse Width: 1 ms
Lead Channel Sensing Intrinsic Amplitude: 12 mV
Lead Channel Sensing Intrinsic Amplitude: 4.4 mV
Lead Channel Setting Pacing Amplitude: 1.5 V
Lead Channel Setting Pacing Amplitude: 1.75 V
Lead Channel Setting Pacing Amplitude: 2.25 V
Lead Channel Setting Pacing Pulse Width: 0.5 ms
Lead Channel Setting Pacing Pulse Width: 1 ms
Lead Channel Setting Sensing Sensitivity: 0.5 mV
Pulse Gen Serial Number: 111006442

## 2019-12-15 ENCOUNTER — Other Ambulatory Visit: Payer: Medicare HMO | Admitting: *Deleted

## 2019-12-15 ENCOUNTER — Other Ambulatory Visit: Payer: Self-pay

## 2019-12-15 DIAGNOSIS — I4819 Other persistent atrial fibrillation: Secondary | ICD-10-CM

## 2019-12-15 LAB — BASIC METABOLIC PANEL
BUN/Creatinine Ratio: 33 — ABNORMAL HIGH (ref 10–24)
BUN: 46 mg/dL — ABNORMAL HIGH (ref 8–27)
CO2: 28 mmol/L (ref 20–29)
Calcium: 9.6 mg/dL (ref 8.6–10.2)
Chloride: 102 mmol/L (ref 96–106)
Creatinine, Ser: 1.38 mg/dL — ABNORMAL HIGH (ref 0.76–1.27)
GFR calc Af Amer: 61 mL/min/{1.73_m2} (ref >59)
GFR calc non Af Amer: 53 mL/min/{1.73_m2} — ABNORMAL LOW (ref >59)
Glucose: 115 mg/dL — ABNORMAL HIGH (ref 65–99)
Potassium: 4.3 mmol/L (ref 3.5–5.2)
Sodium: 138 mmol/L (ref 134–144)

## 2019-12-15 LAB — CBC WITH DIFFERENTIAL/PLATELET
Basophils Absolute: 0 10*3/uL (ref 0.0–0.2)
Basos: 1 %
EOS (ABSOLUTE): 0.2 10*3/uL (ref 0.0–0.4)
Eos: 3 %
Hematocrit: 42 % (ref 37.5–51.0)
Hemoglobin: 14.5 g/dL (ref 13.0–17.7)
Lymphocytes Absolute: 2.2 10*3/uL (ref 0.7–3.1)
Lymphs: 30 %
MCH: 30 pg (ref 26.6–33.0)
MCHC: 34.5 g/dL (ref 31.5–35.7)
MCV: 87 fL (ref 79–97)
Monocytes Absolute: 0.8 10*3/uL (ref 0.1–0.9)
Monocytes: 12 %
Neutrophils Absolute: 4 10*3/uL (ref 1.4–7.0)
Neutrophils: 54 %
Platelets: 132 10*3/uL — ABNORMAL LOW (ref 150–450)
RBC: 4.84 x10E6/uL (ref 4.14–5.80)
RDW: 14.7 % (ref 11.6–15.4)
WBC: 7.3 10*3/uL (ref 3.4–10.8)

## 2019-12-17 ENCOUNTER — Other Ambulatory Visit (HOSPITAL_COMMUNITY)
Admission: RE | Admit: 2019-12-17 | Discharge: 2019-12-17 | Disposition: A | Discharge status: Home / Self Care | Payer: Medicare HMO | Source: Ambulatory Visit | Attending: Internal Medicine | Admitting: Internal Medicine

## 2019-12-17 DIAGNOSIS — Z01812 Encounter for preprocedural laboratory examination: Secondary | ICD-10-CM | POA: Diagnosis not present

## 2019-12-17 DIAGNOSIS — Z20822 Contact with and (suspected) exposure to covid-19: Secondary | ICD-10-CM | POA: Insufficient documentation

## 2019-12-17 LAB — SARS CORONAVIRUS 2 (TAT 6-24 HRS): SARS Coronavirus 2: NEGATIVE

## 2019-12-20 ENCOUNTER — Ambulatory Visit (HOSPITAL_COMMUNITY)
Admission: RE | Admit: 2019-12-20 | Discharge: 2019-12-21 | Disposition: A | Discharge status: Home / Self Care | Payer: Medicare HMO | Attending: Internal Medicine | Admitting: Internal Medicine

## 2019-12-20 ENCOUNTER — Other Ambulatory Visit: Payer: Self-pay

## 2019-12-20 ENCOUNTER — Encounter (HOSPITAL_COMMUNITY)
Admission: RE | Disposition: A | Discharge status: Home / Self Care | Payer: Self-pay | Source: Home / Self Care | Attending: Internal Medicine

## 2019-12-20 DIAGNOSIS — Z6837 Body mass index (BMI) 37.0-37.9, adult: Secondary | ICD-10-CM | POA: Diagnosis not present

## 2019-12-20 DIAGNOSIS — I4819 Other persistent atrial fibrillation: Secondary | ICD-10-CM | POA: Diagnosis not present

## 2019-12-20 DIAGNOSIS — Z95810 Presence of automatic (implantable) cardiac defibrillator: Secondary | ICD-10-CM | POA: Diagnosis not present

## 2019-12-20 DIAGNOSIS — I110 Hypertensive heart disease with heart failure: Secondary | ICD-10-CM | POA: Diagnosis not present

## 2019-12-20 DIAGNOSIS — E669 Obesity, unspecified: Secondary | ICD-10-CM | POA: Insufficient documentation

## 2019-12-20 DIAGNOSIS — G4733 Obstructive sleep apnea (adult) (pediatric): Secondary | ICD-10-CM | POA: Insufficient documentation

## 2019-12-20 DIAGNOSIS — I5022 Chronic systolic (congestive) heart failure: Secondary | ICD-10-CM | POA: Diagnosis not present

## 2019-12-20 DIAGNOSIS — I484 Atypical atrial flutter: Secondary | ICD-10-CM

## 2019-12-20 DIAGNOSIS — Z7901 Long term (current) use of anticoagulants: Secondary | ICD-10-CM | POA: Diagnosis not present

## 2019-12-20 DIAGNOSIS — I428 Other cardiomyopathies: Secondary | ICD-10-CM | POA: Insufficient documentation

## 2019-12-20 DIAGNOSIS — I447 Left bundle-branch block, unspecified: Secondary | ICD-10-CM | POA: Insufficient documentation

## 2019-12-20 DIAGNOSIS — Z79899 Other long term (current) drug therapy: Secondary | ICD-10-CM | POA: Insufficient documentation

## 2019-12-20 HISTORY — PX: AV NODE ABLATION: EP1193

## 2019-12-20 SURGERY — AV NODE ABLATION

## 2019-12-20 MED ORDER — RIVAROXABAN 20 MG PO TABS
20.0000 mg | ORAL_TABLET | Freq: Every day | ORAL | Status: DC
  Administered 2019-12-21: 20 mg via ORAL
  Filled 2019-12-20: qty 1

## 2019-12-20 MED ORDER — TORSEMIDE 20 MG PO TABS
40.0000 mg | ORAL_TABLET | Freq: Every day | ORAL | Status: DC
  Administered 2019-12-21: 40 mg via ORAL
  Filled 2019-12-20: qty 2

## 2019-12-20 MED ORDER — HYDROCODONE-ACETAMINOPHEN 5-325 MG PO TABS
1.0000 | ORAL_TABLET | Freq: Two times a day (BID) | ORAL | Status: DC | PRN
  Administered 2019-12-20: 1 via ORAL
  Filled 2019-12-20: qty 1

## 2019-12-20 MED ORDER — MIDAZOLAM HCL 5 MG/5ML IJ SOLN
INTRAMUSCULAR | Status: DC | PRN
  Administered 2019-12-20 (×2): 2 mg via INTRAVENOUS

## 2019-12-20 MED ORDER — FENTANYL CITRATE (PF) 100 MCG/2ML IJ SOLN
INTRAMUSCULAR | Status: AC
  Filled 2019-12-20: qty 2

## 2019-12-20 MED ORDER — SACUBITRIL-VALSARTAN 97-103 MG PO TABS
1.0000 | ORAL_TABLET | Freq: Two times a day (BID) | ORAL | Status: DC
  Administered 2019-12-20 – 2019-12-21 (×2): 1 via ORAL
  Filled 2019-12-20 (×2): qty 1

## 2019-12-20 MED ORDER — BUPIVACAINE HCL (PF) 0.25 % IJ SOLN
INTRAMUSCULAR | Status: AC
  Filled 2019-12-20: qty 30

## 2019-12-20 MED ORDER — SODIUM CHLORIDE 0.9 % IV SOLN: INTRAVENOUS | Status: DC

## 2019-12-20 MED ORDER — FENTANYL CITRATE (PF) 100 MCG/2ML IJ SOLN
INTRAMUSCULAR | Status: DC | PRN
  Administered 2019-12-20 (×2): 25 ug via INTRAVENOUS

## 2019-12-20 MED ORDER — SODIUM CHLORIDE 0.9% FLUSH
3.0000 mL | Freq: Two times a day (BID) | INTRAVENOUS | Status: DC
  Administered 2019-12-20: 3 mL via INTRAVENOUS

## 2019-12-20 MED ORDER — POTASSIUM CHLORIDE CRYS ER 20 MEQ PO TBCR
20.0000 meq | EXTENDED_RELEASE_TABLET | Freq: Two times a day (BID) | ORAL | Status: DC
  Administered 2019-12-20 – 2019-12-21 (×2): 20 meq via ORAL
  Filled 2019-12-20 (×2): qty 1

## 2019-12-20 MED ORDER — BUPIVACAINE HCL (PF) 0.25 % IJ SOLN
INTRAMUSCULAR | Status: DC | PRN
  Administered 2019-12-20: 30 mL

## 2019-12-20 MED ORDER — CARVEDILOL 6.25 MG PO TABS
6.2500 mg | ORAL_TABLET | Freq: Two times a day (BID) | ORAL | Status: DC
  Administered 2019-12-20 – 2019-12-21 (×2): 6.25 mg via ORAL
  Filled 2019-12-20 (×2): qty 1

## 2019-12-20 MED ORDER — SODIUM CHLORIDE 0.9% FLUSH: 3.0000 mL | INTRAVENOUS | Status: DC | PRN

## 2019-12-20 MED ORDER — ONDANSETRON HCL 4 MG/2ML IJ SOLN: 4.0000 mg | Freq: Four times a day (QID) | INTRAMUSCULAR | Status: DC | PRN

## 2019-12-20 MED ORDER — SODIUM CHLORIDE 0.9 % IV SOLN: 250.0000 mL | INTRAVENOUS | Status: DC | PRN

## 2019-12-20 MED ORDER — SPIRONOLACTONE 25 MG PO TABS
25.0000 mg | ORAL_TABLET | Freq: Every day | ORAL | Status: DC
  Administered 2019-12-20 – 2019-12-21 (×2): 25 mg via ORAL
  Filled 2019-12-20 (×2): qty 1

## 2019-12-20 MED ORDER — ACETAMINOPHEN 325 MG PO TABS: 650.0000 mg | ORAL_TABLET | ORAL | Status: DC | PRN

## 2019-12-20 MED ORDER — MIDAZOLAM HCL 5 MG/5ML IJ SOLN
INTRAMUSCULAR | Status: AC
  Filled 2019-12-20: qty 5

## 2019-12-20 SURGICAL SUPPLY — 7 items
CATH CELSIUS THERMO F CV 7FR (ABLATOR) ×2 IMPLANT
DEVICE CLOSURE PERCLS PRGLD 6F (VASCULAR PRODUCTS) ×1 IMPLANT
PACK EP LATEX FREE (CUSTOM PROCEDURE TRAY) ×2
PACK EP LF (CUSTOM PROCEDURE TRAY) ×1 IMPLANT
PAD PRO RADIOLUCENT 2001M-C (PAD) ×2 IMPLANT
PERCLOSE PROGLIDE 6F (VASCULAR PRODUCTS) ×2
SHEATH PINNACLE 8F 10CM (SHEATH) ×2 IMPLANT

## 2019-12-20 NOTE — Interval H&P Note (Signed)
History and Physical Interval Note:  12/20/2019 3:31 PM  [name]  has presented today for surgery, with the diagnosis of av.  The various methods of treatment have been discussed with the patient and family. After consideration of risks, benefits and other options for treatment, the patient has consented to  Procedure(s): AV NODE ABLATION (N/A) as a surgical intervention.  The patient's history has been reviewed, patient examined, no change in status, stable for surgery.  I have reviewed the patient's chart and labs.  Questions were answered to the patient's satisfaction.    He is aware that he will be device dependant.  We discussed this at length again today.  He accepts risks and wishes to proceed.  Risk, benefits, and alternatives to AV nodal ablation were also discussed in detail today. These risks include but are not limited to stroke, bleeding, vascular damage, tamponade, perforation, damage to the heart and other structures, ICD lead dislodgement, worsening renal function, and death. The patient understands these risk and wishes to proceed.      [place] Grayer

## 2019-12-20 NOTE — Progress Notes (Signed)
Put in order for patient.  Patient brought his home unit to the hospital.  Placed unit at bedside for patient.  Patient self manages his own machine.  Will continue to monitor.

## 2019-12-21 ENCOUNTER — Other Ambulatory Visit (HOSPITAL_COMMUNITY): Payer: Self-pay | Admitting: Cardiology

## 2019-12-21 DIAGNOSIS — Z95810 Presence of automatic (implantable) cardiac defibrillator: Secondary | ICD-10-CM | POA: Diagnosis not present

## 2019-12-21 DIAGNOSIS — E669 Obesity, unspecified: Secondary | ICD-10-CM | POA: Diagnosis not present

## 2019-12-21 DIAGNOSIS — I5022 Chronic systolic (congestive) heart failure: Secondary | ICD-10-CM | POA: Diagnosis not present

## 2019-12-21 DIAGNOSIS — I4819 Other persistent atrial fibrillation: Secondary | ICD-10-CM | POA: Diagnosis not present

## 2019-12-21 DIAGNOSIS — I110 Hypertensive heart disease with heart failure: Secondary | ICD-10-CM | POA: Diagnosis not present

## 2019-12-21 DIAGNOSIS — I484 Atypical atrial flutter: Secondary | ICD-10-CM | POA: Diagnosis not present

## 2019-12-21 DIAGNOSIS — G4733 Obstructive sleep apnea (adult) (pediatric): Secondary | ICD-10-CM | POA: Diagnosis not present

## 2019-12-21 DIAGNOSIS — Z6837 Body mass index (BMI) 37.0-37.9, adult: Secondary | ICD-10-CM | POA: Diagnosis not present

## 2019-12-21 DIAGNOSIS — I447 Left bundle-branch block, unspecified: Secondary | ICD-10-CM | POA: Diagnosis not present

## 2019-12-21 DIAGNOSIS — I428 Other cardiomyopathies: Secondary | ICD-10-CM | POA: Diagnosis not present

## 2019-12-21 MED ORDER — OFF THE BEAT BOOK
Freq: Once | Status: AC
  Filled 2019-12-21: qty 1

## 2019-12-21 MED ORDER — ACETAMINOPHEN 325 MG PO TABS: 650.0000 mg | ORAL_TABLET | ORAL | Status: DC | PRN

## 2019-12-21 NOTE — Discharge Summary (Addendum)
ELECTROPHYSIOLOGY PROCEDURE DISCHARGE SUMMARY    Patient ID: [name],  MRN: [id number], DOB/AGE: 1950-07-22 69 y.o.  Admit date: 12/20/2019 Discharge date: 12/21/2019  Primary Care Physician: Seward Carol, MD  Primary Cardiologist: Quay Burow, MD  Electrophysiologist: Dr. Rayann Heman  Primary Discharge Diagnosis:  Persistent atrial fibrillation  Atypical atrial flutter  Secondary Discharge Diagnosis:  Chronic systolic CHF s/p BIV ICD NICM LBBB HTN  Procedures This Admission:  AV nodal ablation catheter ablation   This study demonstrated; 1. Atypical atrial flutter with RVR upon presentation.  2. Successful AV nodal ablation 3. No early apparent complications.   Brief HPI: [name] is a 69 y.o. male with a history of persistent atrial fibrillation and atypical atrial flutter with RVR, CHF, NICM, and LBBB s/p BIV pacing. Pt had no AAD options and BiV pacing limited by RVR so was recommended for AV nodal ablation.  Risks, benefits, and alternatives to catheter ablation of AV node were reviewed with the patient who wished to proceed.  The patient previously had a St Jude BiV ICD implanted.  Hospital Course:  The patient was admitted and underwent catheter ablation of the AV node with details as outlined above.  They were monitored on telemetry overnight which demonstrated appropriate BiV pacing.  Groin was without complication on the day of discharge.  The patient was examined and considered to be stable for discharge.  Wound care and restrictions were reviewed with the patient.  The patient will be seen back by device clinic in 2 weeks and Dr. Rayann Heman in 6 weeks for post ablation follow up and device adjustment.  This patients CHA2DS2-VASc Score and unadjusted Ischemic Stroke Rate (% per year) is equal to 3.2 % stroke rate/year from a score of 3 Above score calculated as 1 point each if present [CHF, HTN, DM, Vascular=MI/PAD/Aortic Plaque, Age if 65-74, or  Male] Above score calculated as 2 points each if present [Age > 75, or Stroke/TIA/TE]   Physical Exam: Vitals:   12/20/19 1954 12/21/19 0004 12/21/19 0702 12/21/19 0750  BP:   112/78 104/66  Pulse: 80 81 81 83  Resp:    13  Temp: 98 F (36.7 C) 97.9 F (36.6 C) 98.1 F (36.7 C) 98 F (36.7 C)  TempSrc: Oral Oral Oral Oral  SpO2:  98% 99% 99%  Weight:   119.2 kg   Height:        GEN- The patient is well appearing, alert and oriented x 3 today.   HEENT: normocephalic, atraumatic; sclera clear, conjunctiva pink; hearing intact; oropharynx clear; neck supple  Lungs- Clear to ausculation bilaterally, normal work of breathing.  No wheezes, rales, rhonchi Heart- Regular rate and rhythm, no murmurs, rubs or gallops  GI- soft, non-tender, non-distended, bowel sounds present  Extremities- no clubbing, cyanosis, or edema; DP/PT/radial pulses 2+ bilaterally, groin without hematoma/bruit MS- no significant deformity or atrophy Skin- warm and dry, no rash or lesion Psych- euthymic mood, full affect Neuro- strength and sensation are intact   Labs:   Lab Results  Component Value Date   WBC 7.3 12/15/2019   HGB 14.5 12/15/2019   HCT 42.0 12/15/2019   MCV 87 12/15/2019   PLT 132 (L) 12/15/2019    Recent Labs  Lab 12/15/19 0827  NA 138  K 4.3  CL 102  CO2 28  BUN 46*  CREATININE 1.38*  CALCIUM 9.6  GLUCOSE 115*     Discharge Medications:  Allergies as of 12/21/2019  Reactions   Metoprolol Tartrate Shortness Of Breath   Shortness of breath, pressure in chest and back   Oxycodone Rash, Other (See Comments)   Can tolerate Hydrocodone      Medication List    TAKE these medications   acetaminophen 325 MG tablet Commonly known as: TYLENOL Take 2 tablets (650 mg total) by mouth every 4 (four) hours as needed for headache or mild pain.   Aleve PM 220-25 MG Tabs Generic drug: Naproxen Sod-diphenhydrAMINE Take 2 tablets by mouth at bedtime as needed (sleep/pain).    carvedilol 6.25 MG tablet Commonly known as: COREG Take 1 tablet (6.25 mg total) by mouth 2 (two) times daily.   clobetasol ointment 0.05 % Commonly known as: TEMOVATE Apply 1 application topically daily as needed (rash).   FISH OIL PO Take 1 capsule by mouth 4 (four) times a week.   HYDROcodone-acetaminophen 5-325 MG tablet Commonly known as: NORCO/VICODIN Take 1 tablet by mouth every 12 (twelve) hours as needed for moderate pain.   Klor-Con M20 20 MEQ tablet Generic drug: potassium chloride SA Take 1 tablet (20 mEq total) by mouth 2 (two) times daily.   multivitamin with minerals Tabs tablet Take 1 tablet by mouth daily.   OVER THE COUNTER MEDICATION Apply 1 spray topically daily as needed (cramps). Topical Magnesium Spray   PROBIOTIC PO Take 1 capsule by mouth daily.   sacubitril-valsartan 97-103 MG Commonly known as: ENTRESTO Take 1 tablet by mouth 2 (two) times daily.   spironolactone 25 MG tablet Commonly known as: ALDACTONE TAKE 1 TABLET BY MOUTH EVERY DAY   torsemide 20 MG tablet Commonly known as: DEMADEX Take 2 tablets (40 mg total) by mouth daily.   Xarelto 20 MG Tabs tablet Generic drug: rivaroxaban TAKE ONE TABLET BY MOUTH DAILY WITH SUPPER What changed: See the new instructions.       Disposition:   Follow-up Information    Thom[place] Grayer, MD Follow up on 02/06/2020.   Specialty: Cardiology Why: at 345 pm for 6 week post AV nodal ablation f/u. Contact information: [address] [place] [address] [phone]        [place] Follow up on 01/03/2020.   Why: at 1200 noon for 2 week post AV nodal ablation follow up Contact information: [place] [id number] [phone]          Duration of Discharge Encounter: Greater than 30 minutes including physician time.  Signed, Shirley Friar, PA-C  12/21/2019 8:51 AM   I  have seen, examined the patient, and reviewed the above assessment and plan.  Changes to above are made where necessary.  On exam, RRR (paced) Doing well s/p AV nodal ablation.  No concerns.  Follow-up in 2 weeks in the device clinic to turn V pacing rate down to 70 bpm.  Follow-up with me in 6 weeks.  Co Sign: [place] Grayer, MD

## 2019-12-21 NOTE — Discharge Instructions (Signed)
Cardiac Ablation, Care After  This sheet gives you information about how to care for yourself after your procedure. Your health care provider may also give you more specific instructions. If you have problems or questions, contact your health care provider. What can I expect after the procedure? After the procedure, it is common to have:  Bruising around your puncture site.  Tenderness around your puncture site.  Skipped heartbeats.  Tiredness (fatigue).  Follow these instructions at home: Puncture site care   Follow instructions from your health care provider about how to take care of your puncture site. Make sure you: ? If present, leave stitches (sutures), skin glue, or adhesive strips in place. These skin closures may need to stay in place for up to 2 weeks. If adhesive strip edges start to loosen and curl up, you may trim the loose edges. Do not remove adhesive strips completely unless your health care provider tells you to do that.  Check your puncture site every day for signs of infection. Check for: ? Redness, swelling, or pain. ? Fluid or blood. If your puncture site starts to bleed, lie down on your back, apply firm pressure to the area, and contact your health care provider. ? Warmth. ? Pus or a bad smell. Driving  Do not drive for until Friday 6/11 per Dr. Rayann Heman (Do not resume driving if you have previously been instructed not to drive for other health reasons.)  Do not drive or use heavy machinery while taking prescription pain medicine. Activity  Avoid activities that take a lot of effort for at least 7 days after your procedure.  Do not lift anything that is heavier than 5 lb (4.5 kg) for one week.   No sexual activity for 1 week.   Return to your normal activities as told by your health care provider. Ask your health care provider what activities are safe for you. General instructions  Take over-the-counter and prescription medicines only as told by your  health care provider.  Do not use any products that contain nicotine or tobacco, such as cigarettes and e-cigarettes. If you need help quitting, ask your health care provider.  You may shower after 24 hours, but Do not take baths, swim, or use a hot tub for 1 week.   Do not drink alcohol for 24 hours after your procedure.  Keep all follow-up visits as told by your health care provider. This is important. Contact a health care provider if:  You have redness, mild swelling, or pain around your puncture site.  You have fluid or blood coming from your puncture site that stops after applying firm pressure to the area.  Your puncture site feels warm to the touch.  You have pus or a bad smell coming from your puncture site.  You have a fever.  You have chest pain or discomfort that spreads to your neck, jaw, or arm.  You are sweating a lot.  You feel nauseous.  You have a fast or irregular heartbeat.  You have shortness of breath.  You are dizzy or light-headed and feel the need to lie down.  You have pain or numbness in the arm or leg closest to your puncture site. Get help right away if:  Your puncture site suddenly swells.  Your puncture site is bleeding and the bleeding does not stop after applying firm pressure to the area. These symptoms may represent a serious problem that is an emergency. Do not wait to see if the symptoms will  go away. Get medical help right away. Call your local emergency services (911 in the [place] Do not drive yourself to the hospital. Summary  After the procedure, it is normal to have bruising and tenderness at the puncture site in your groin, neck, or forearm.  Check your puncture site every day for signs of infection.  Get help right away if your puncture site is bleeding and the bleeding does not stop after applying firm pressure to the area. This is a medical emergency. This information is not intended to replace advice given to you by your  health care provider. Make sure you discuss any questions you have with your health care provider.

## 2019-12-22 ENCOUNTER — Encounter (HOSPITAL_COMMUNITY): Payer: Medicare HMO | Admitting: Cardiology

## 2019-12-22 ENCOUNTER — Other Ambulatory Visit (HOSPITAL_COMMUNITY): Payer: Medicare HMO

## 2019-12-26 ENCOUNTER — Ambulatory Visit: Payer: Medicare HMO

## 2019-12-26 ENCOUNTER — Other Ambulatory Visit: Payer: Self-pay

## 2019-12-26 VITALS — BP 104/68 | HR 85 | Ht 70.0 in | Wt 269.0 lb

## 2019-12-26 DIAGNOSIS — I4819 Other persistent atrial fibrillation: Secondary | ICD-10-CM

## 2019-12-26 NOTE — Progress Notes (Signed)
1.) Reason for visit: Pt c/o bruising/groin pain s/p AV node ablation  2.) Name of MD requesting visit: Allred, [place]  3.) H&P: Pt s/p AV node ablation  4.) ROS related to problem: Groin pain/bruising  5.) Assessment and plan per MD: Assessed by Oda Kilts.

## 2019-12-26 NOTE — Progress Notes (Signed)
RN visit for groin bruising s/p AV nodal ablation.    Pt reports soreness and bruising "worse than last time".   On exam pt with "pea-size" nodule just below access site. Non-tender. Pt with dependent ecchymosis circumferentially. It is slightly tender. It is not warm or firm to the touch.     No bruit. Pt initially states he had "chills" last night. No fever.  When clarified further he states he felt his arms were "cool to the touch" but he did not have shivering, shaking, or the feeling of being cold.   Pt has device check next week. Knows to call back with any worsening symptoms, or fever/chills.    Legrand Como "Jonni Sanger" Garrett, PA-C  12/26/2019 11:44 AM    Circumferential ecchymosis without edema, tenderness, or firmness. Skin tag medially.     Access site.

## 2019-12-27 ENCOUNTER — Other Ambulatory Visit: Payer: Medicare HMO

## 2020-01-03 ENCOUNTER — Ambulatory Visit (INDEPENDENT_AMBULATORY_CARE_PROVIDER_SITE_OTHER): Payer: Medicare HMO | Admitting: Emergency Medicine

## 2020-01-03 ENCOUNTER — Other Ambulatory Visit: Payer: Self-pay

## 2020-01-03 DIAGNOSIS — I428 Other cardiomyopathies: Secondary | ICD-10-CM | POA: Diagnosis not present

## 2020-01-03 LAB — CUP PACEART INCLINIC DEVICE CHECK
Battery Remaining Longevity: 75 mo
Brady Statistic RA Percent Paced: 0 %
Brady Statistic RV Percent Paced: 99.81 %
Date Time Interrogation Session: 20210622120000
HighPow Impedance: 74.25 Ohm
HighPow Impedance: 74.25 Ohm
Implantable Lead Implant Date: 20210209
Implantable Lead Implant Date: 20210209
Implantable Lead Implant Date: 20210209
Implantable Lead Location: 753858
Implantable Lead Location: 753859
Implantable Lead Location: 753860
Implantable Pulse Generator Implant Date: 20210209
Lead Channel Impedance Value: 500 Ohm
Lead Channel Impedance Value: 500 Ohm
Lead Channel Impedance Value: 500 Ohm
Lead Channel Impedance Value: 500 Ohm
Lead Channel Impedance Value: 750 Ohm
Lead Channel Impedance Value: 750 Ohm
Lead Channel Pacing Threshold Amplitude: 0.75 V
Lead Channel Pacing Threshold Amplitude: 1.5 V
Lead Channel Pacing Threshold Pulse Width: 0.5 ms
Lead Channel Pacing Threshold Pulse Width: 0.5 ms
Lead Channel Sensing Intrinsic Amplitude: 12 mV
Lead Channel Sensing Intrinsic Amplitude: 4.7 mV
Lead Channel Setting Pacing Amplitude: 2.5 V
Lead Channel Setting Pacing Amplitude: 2.5 V
Lead Channel Setting Pacing Pulse Width: 0.5 ms
Lead Channel Setting Pacing Pulse Width: 0.5 ms
Lead Channel Setting Sensing Sensitivity: 0.5 mV
Pulse Gen Serial Number: 111006442

## 2020-01-03 NOTE — Progress Notes (Signed)
CRT-D device check in office. Thresholds and sensing consistent with previous device measurements. Lead impedance trends stable over time. + Xarelto. No ventricular arrhythmia episodes recorded. Patient bi-ventricularly pacing 99% of the time. Device programmed with appropriate safety margins. Heart failure diagnostics reviewed and trends are stable for patient. Base rate programmed from 80 bpm to 70 bpm 2 weeks status post AV node ablation. Estimated longevity 6 yrs 3 months.  Patient enrolled in remote follow up. Plan to check device remotely 02/21/20. Patient education completed including shock plan. Follow up with Dr Rayann Heman on 02/06/20.

## 2020-01-18 ENCOUNTER — Ambulatory Visit: Payer: Medicare HMO | Admitting: Internal Medicine

## 2020-01-30 ENCOUNTER — Ambulatory Visit: Payer: Medicare HMO | Admitting: Internal Medicine

## 2020-01-30 ENCOUNTER — Other Ambulatory Visit: Payer: Self-pay

## 2020-01-30 VITALS — BP 100/64 | HR 72 | Ht 70.0 in | Wt 268.0 lb

## 2020-01-30 DIAGNOSIS — I447 Left bundle-branch block, unspecified: Secondary | ICD-10-CM | POA: Diagnosis not present

## 2020-01-30 DIAGNOSIS — I10 Essential (primary) hypertension: Secondary | ICD-10-CM

## 2020-01-30 DIAGNOSIS — I484 Atypical atrial flutter: Secondary | ICD-10-CM

## 2020-01-30 DIAGNOSIS — I4819 Other persistent atrial fibrillation: Secondary | ICD-10-CM

## 2020-01-30 DIAGNOSIS — I428 Other cardiomyopathies: Secondary | ICD-10-CM | POA: Diagnosis not present

## 2020-01-30 DIAGNOSIS — I5032 Chronic diastolic (congestive) heart failure: Secondary | ICD-10-CM

## 2020-01-30 NOTE — Progress Notes (Signed)
PCP: Seward Carol, MD Primary Cardiologist: [name] Primary EP: Dr Rayann Heman  [name] is a 69 y.o. male who presents today for routine electrophysiology followup.  Since his AV nodal ablation the patient reports doing very well.  He feels "great" and is more active since his AV nodal ablation.  Very pleased with results.  Groin healed without event.  Denies procedure related complications.  Today, he denies symptoms of palpitations, chest pain, shortness of breath,  lower extremity edema, dizziness, presyncope, syncope, or ICD shocks.  The patient is otherwise without complaint today.   Past Medical History:  Diagnosis Date  . Aortic insufficiency 04/21/2018  . Aortic insufficiency   . Arthritis    "joints" (09/26/2013)  . Atrial enlargement, left   . Dyspnea    with exertion  . Hearing aid worn    both ears  . Hypertension   . Mitral regurgitation 04/21/2018  . Obesity   . OSA on CPAP    "mask adjusts to what I need" (09/26/2013)  . Persistent atrial fibrillation ([place])    cardioversion 06/06/13  . PONV (postoperative nausea and vomiting)   . Rotator cuff tear, right dec 2012   physical therapy done, decreased strength  . S/P aortic valve repair 05/18/2018   Complex valvuloplasty including plication of right coronary leaflet and 21 mm Biostable HAART annuloplasty ring  . S/P Maze operation for atrial fibrillation 05/18/2018   Complete bilateral atrial lesion set using bipolar radiofrequency and cryothermy ablation with clipping of LA appendage  . S/P mitral valve repair 05/18/2018   Complex valvuloplasty including artificial Gore-tex neochord placement x4 and 30 mm Sorin Memo 4D ring annuloplasty  . Wears glasses    Past Surgical History:  Procedure Laterality Date  . AORTIC VALVE REPAIR N/A 05/18/2018   Procedure: AORTIC VALVE REPAIR using HAART 300 Aortic Annuloplasty Device size 46mm;  Surgeon: Rexene Alberts, MD;  Location: [place];  Service: Open Heart Surgery;   Laterality: N/A;  . ATRIAL FIBRILLATION ABLATION N/A 04/26/2019   Procedure: ATRIAL FIBRILLATION ABLATION;  Surgeon: Thompson Grayer, MD;  Location: [place] [facility];  Service: Cardiovascular;  Laterality: N/A;  . AV NODE ABLATION N/A 12/20/2019   Procedure: AV NODE ABLATION;  Surgeon: Thompson Grayer, MD;  Location: [place] [facility];  Service: Cardiovascular;  Laterality: N/A;  . BIV ICD INSERTION CRT-D N/A 08/23/2019   Procedure: BIV ICD INSERTION CRT-D;  Surgeon: Thompson Grayer, MD;  Location: [place] [facility];  Service: Cardiovascular;  Laterality: N/A;  . CARDIOVERSION N/A 06/06/2013   Procedure: CARDIOVERSION;  Surgeon: Sanda Klein, MD;  Location: [place] [facility];  Service: Cardiovascular;  Laterality: N/A;  . CARDIOVERSION N/A 09/28/2013   Procedure: CARDIOVERSION BEDSIDE;  Surgeon: Peter M Martinique, MD;  Location: [place];  Service: Cardiovascular;  Laterality: N/A;  . CARDIOVERSION N/A 11/30/2018   Procedure: CARDIOVERSION;  Surgeon: Dorothy Spark, MD;  Location: [facility] ENDOSCOPY;  Service: Cardiovascular;  Laterality: N/A;  . CARDIOVERSION N/A 03/10/2019   Procedure: CARDIOVERSION;  Surgeon: [name], MD;  Location: [place];  Service: Endoscopy;  Laterality: N/A;  . CARPAL TUNNEL RELEASE Right 08/08/2013   Procedure: RIGHT CARPAL TUNNEL RELEASE;  Surgeon: Wynonia Sours, MD;  Location: [place];  Service: Orthopedics;  Laterality: Right;  . CARPAL TUNNEL RELEASE Left 2008  . CLIPPING OF ATRIAL APPENDAGE  05/18/2018   Procedure: CLIPPING OF ATRIAL APPENDAGE using AtriCure Clip size 45;  Surgeon: Rexene Alberts, MD;  Location:  [facility] OR;  Service: Open Heart Surgery;;  . KNEE ARTHROSCOPY Right 1990's  . MAZE N/A 05/18/2018   Procedure: MAZE;  Surgeon: Rexene Alberts, MD;  Location: [place];  Service: Open Heart Surgery;  Laterality: N/A;  . MITRAL VALVE REPAIR N/A 05/18/2018   Procedure: MITRAL VALVE REPAIR (MVR) using 4D Memo Ring size 30;  Surgeon: Rexene Alberts, MD;  Location: [place];  Service: Open Heart Surgery;  Laterality: N/A;  . RIGHT/LEFT HEART CATH AND CORONARY ANGIOGRAPHY N/A 04/21/2018   Procedure: RIGHT/LEFT HEART CATH AND CORONARY ANGIOGRAPHY;  Surgeon: Nelva Bush, MD;  Location: [place] [facility];  Service: Cardiovascular;  Laterality: N/A;  . SHOULDER OPEN ROTATOR CUFF REPAIR Left 1990's  . SHOULDER OPEN ROTATOR CUFF REPAIR Right 11/2007  . TEE WITHOUT CARDIOVERSION N/A 04/21/2018   Procedure: TRANSESOPHAGEAL ECHOCARDIOGRAM (TEE);  Surgeon: Jerline Pain, MD;  Location: [facility] ENDOSCOPY;  Service: Cardiovascular;  Laterality: N/A;  . TEE WITHOUT CARDIOVERSION N/A 05/18/2018   Procedure: TRANSESOPHAGEAL ECHOCARDIOGRAM (TEE);  Surgeon: Rexene Alberts, MD;  Location: [place];  Service: Open Heart Surgery;  Laterality: N/A;  . TONSILLECTOMY  1950's  . TOTAL HIP ARTHROPLASTY Left 2010  . TOTAL HIP REVISION Left 10/08/2011  . TOTAL HIP REVISION  04/09/2012   Procedure: TOTAL HIP REVISION;  Surgeon: Gearlean Alf, MD;  Location: [facility];  Service: Orthopedics;  Laterality: Left;  Left Hip Acetabular Revision vs Constrained Liner  . TOTAL KNEE ARTHROPLASTY Right 2006   . TRIGGER FINGER RELEASE Right 08/08/2013   Procedure: RELEASE STENOSING TENOSYNOVITIS RIGHT THUMB;  Surgeon: Wynonia Sours, MD;  Location: [employer];  Service: Orthopedics;  Laterality: Right;    ROS- all systems are reviewed and negative except as per HPI above  Current Outpatient Medications  Medication Sig Dispense Refill  . acetaminophen (TYLENOL) 325 MG tablet Take 2 tablets (650 mg total) by mouth every 4 (four) hours as needed for headache or mild pain.    . carvedilol (COREG) 6.25 MG tablet Take 1 tablet (6.25 mg total) by mouth 2 (two) times daily. 180 tablet 3  . clobetasol ointment (TEMOVATE) 0.86 % Apply 1 application topically daily as needed (rash).    . diclofenac Sodium (VOLTAREN) 1 % GEL Apply 1 application topically 4 (four) times daily.    Marland Kitchen  HYDROcodone-acetaminophen (NORCO/VICODIN) 5-325 MG tablet Take 1 tablet by mouth every 12 (twelve) hours as needed for moderate pain. 5 tablet 0  . KLOR-CON M20 20 MEQ tablet Take 1 tablet (20 mEq total) by mouth 2 (two) times daily. 60 tablet 11  . Multiple Vitamin (MULTIVITAMIN WITH MINERALS) TABS tablet Take 1 tablet by mouth daily.     . Naproxen Sod-diphenhydrAMINE (ALEVE PM) 220-25 MG TABS Take 2 tablets by mouth at bedtime as needed (sleep/pain).    . Omega-3 Fatty Acids (FISH OIL PO) Take 1 capsule by mouth 4 (four) times a week.     Marland Kitchen OVER THE COUNTER MEDICATION Apply 1 spray topically daily as needed (cramps). Topical Magnesium Spray    . Probiotic Product (PROBIOTIC PO) Take 1 capsule by mouth daily.    . sacubitril-valsartan (ENTRESTO) 97-103 MG Take 1 tablet by mouth 2 (two) times daily. 60 tablet 6  . spironolactone (ALDACTONE) 25 MG tablet TAKE 1 TABLET BY MOUTH EVERY DAY 90 tablet 1  . torsemide (DEMADEX) 20 MG tablet Take 2 tablets (40 mg total) by mouth daily. 180 tablet 3  . XARELTO 20 MG TABS tablet  TAKE ONE TABLET BY MOUTH DAILY WITH SUPPER 90 tablet 2   No current facility-administered medications for this visit.    Physical Exam: Vitals:   01/30/20 1605  BP: 100/64  Pulse: 72  SpO2: 97%  Weight: 268 lb (121.6 kg)  Height: 5\' 10"  (1.778 m)   GEN- The patient is well appearing, alert and oriented x 3 today.   Head- normocephalic, atraumatic Eyes-  Sclera clear, conjunctiva pink Ears- hearing intact Oropharynx- clear Lungs-  normal work of breathing Chest- ICD pocket is well healed Heart- Regular rate and rhythm (paced) GI- soft  Extremities- no clubbing, cyanosis, or edema  ICD interrogation- reviewed in detail today,  See PACEART report  ekg tracing ordered today is personally reviewed and shows afib with BiV pacing  Wt Readings from Last 3 Encounters:  01/30/20 268 lb (121.6 kg)  12/26/19 269 lb (122 kg)  12/21/19 262 lb 13.7 oz (119.2 kg)     Assessment and Plan:  1.  Chronic systolic dysfunction/ nonischemic CM euvolemic today Stable on an appropriate medical regimen enroll in ICM device clinic Hopefully EF will recover with AV nodal ablation for afib with RVR May need to back off of diuretics as his EF improves.  2. Complete heart block Normal BiV ICD function See Pace Art report Reprogrammed from VVIR 70 to VVIR60 and additional rate response by increasing slope from 8 to 10.  Hall walk demonstrated good heart rate response with appropriate increased to 89 bpm with ambulation by me he is device dependant today  3. Persistent afib S/p AV nodal ablation Continue anticoagulation long term with xarelto Of note, his LAA has been clipped at time of MAZE  4. HTN Stable No change required today  Return in 3 months with see EP PA Update echo at that time  Risks, benefits and potential toxicities for medications prescribed and/or refilled reviewed with patient today.   Thompson Grayer MD, [facility] 01/30/2020 4:07 PM

## 2020-01-30 NOTE — Patient Instructions (Addendum)
Medication Instructions:  Your physician recommends that you continue on your current medications as directed. Please refer to the Current Medication list given to you today.  *If you need a refill on your cardiac medications before your next appointment, please call your pharmacy*  Lab Work: None ordered.  If you have labs (blood work) drawn today and your tests are completely normal, you will receive your results only by: Marland Kitchen MyChart Message (if you have MyChart) OR . A paper copy in the mail If you have any lab test that is abnormal or we need to change your treatment, we will call you to review the results.  Testing/Procedures: Your physician has requested that you have an echocardiogram. Echocardiography is a painless test that uses sound waves to create images of your heart. It provides your doctor with information about the size and shape of your heart and how well your heart's chambers and valves are working. This procedure takes approximately one hour. There are no restrictions for this procedure.  PLEASE SCHEDULE ECHO  Follow-Up: At [facility], you and your health needs are our priority.  As part of our continuing mission to provide you with exceptional heart care, we have created designated Provider Care Teams.  These Care Teams include your primary Cardiologist (physician) and Advanced Practice Providers (APPs -  Physician Assistants and Nurse Practitioners) who all work together to provide you with the care you need, when you need it.  We recommend signing up for the patient portal called "MyChart".  Sign up information is provided on this After Visit Summary.  MyChart is used to connect with patients for Virtual Visits (Telemedicine).  Patients are able to view lab/test results, encounter notes, upcoming appointments, etc.  Non-urgent messages can be sent to your provider as well.   To learn more about what you can do with MyChart, go to [email or web address].    Your next  appointment:   Your physician wants you to follow-up in: [address] [place]   Remote monitoring is used to monitor your ICD from home. This monitoring reduces the number of office visits required to check your device to one time per year. It allows [place] to keep an eye on the functioning of your device to ensure it is working properly. You are scheduled for a device check from home on 02/21/20. You may send your transmission at any time that day. If you have a wireless device, the transmission will be sent automatically. After your physician reviews your transmission, you will receive a postcard with your next transmission date.  Other Instructions:

## 2020-01-31 ENCOUNTER — Telehealth: Payer: Self-pay

## 2020-01-31 NOTE — Telephone Encounter (Signed)
Patient referred to [facility] clinic by Dr Rayann Heman.  Attempted ICM intro call and left message with number to return call.

## 2020-01-31 NOTE — Telephone Encounter (Signed)
-----   Message from Thompson Grayer, MD sent at 01/30/2020  4:16 PM EDT ----- Please enroll in ICM device clinic He is very interested and willing to proceed

## 2020-01-31 NOTE — Telephone Encounter (Signed)
Spoke with patient.  Provided ICM intro and he agreed to monthly ICM calls.  He reports he is doing well and recently returned from vacation.  He is not able to take Torsemide on days that he is driving on vacation but other wise is compliant with taking meds.  He reports Dr Rayann Heman approved that he can self adjust Torsemide when needed.  He is due to make appt with [name].  He weighs daily and baseline weight is normally about 260 lbs but he is currently at 268 lbs due to recent vacation.  He denies any fluid symptoms today.   Advised [place] app says it has been disconnected and he sent remote transmission to reconnect.  Transmission received and reviewed with patient.   ICM remote transmission scheduled 02/22/2020   Provided ICM number and encouraged to call if he experiences any fluid symptoms.

## 2020-02-01 ENCOUNTER — Telehealth (HOSPITAL_COMMUNITY): Payer: Self-pay | Admitting: *Deleted

## 2020-02-01 MED ORDER — SACUBITRIL-VALSARTAN 97-103 MG PO TABS: 0.5000 | ORAL_TABLET | Freq: Two times a day (BID) | ORAL | 6 refills | Status: DC

## 2020-02-01 NOTE — Telephone Encounter (Signed)
Can decrease Entresto to 49/51 bid.

## 2020-02-01 NOTE — Telephone Encounter (Signed)
Pt aware, agreeable and verbalized understanding, med list updated, he just got a new bottle of 97/103 so he will cut those in half for now

## 2020-02-01 NOTE — Telephone Encounter (Signed)
Pt called with concerns of BP running on the low side, he states since his ablation BP has been in the 100's over 60's, it was in the 118-120 range prior. He denies dizziness but c/o increased fatigue. He is on Entresto 97/103 BID, Torsemide 40 mg Daily (he states if he takes less he often begins to retain fluid), Carvedilol 6.25 mg BID, and Spiro 25 mg Daily. He wants to know if something could be cut out or decreased. Advised I would send to [name] for review and call him back.

## 2020-02-06 ENCOUNTER — Encounter: Payer: Medicare HMO | Admitting: Internal Medicine

## 2020-02-08 DIAGNOSIS — H5213 Myopia, bilateral: Secondary | ICD-10-CM | POA: Diagnosis not present

## 2020-02-08 NOTE — Telephone Encounter (Signed)
Pt left VM stating despite decrease in entresto his bp was still low. Last night he got a reading of 66/44. I called pt back to get more information. No answer/left VM for pt to return my call.

## 2020-02-16 LAB — CUP PACEART INCLINIC DEVICE CHECK
Date Time Interrogation Session: 20210719162135
Implantable Lead Implant Date: 20210209
Implantable Lead Implant Date: 20210209
Implantable Lead Implant Date: 20210209
Implantable Lead Location: 753858
Implantable Lead Location: 753859
Implantable Lead Location: 753860
Implantable Pulse Generator Implant Date: 20210209
Lead Channel Pacing Threshold Amplitude: 0.75 V
Lead Channel Pacing Threshold Amplitude: 1.75 V
Lead Channel Pacing Threshold Pulse Width: 0.5 ms
Lead Channel Sensing Intrinsic Amplitude: 4.1 mV
Pulse Gen Serial Number: 111006442

## 2020-02-21 ENCOUNTER — Ambulatory Visit (INDEPENDENT_AMBULATORY_CARE_PROVIDER_SITE_OTHER): Payer: Medicare HMO | Admitting: *Deleted

## 2020-02-21 ENCOUNTER — Other Ambulatory Visit (HOSPITAL_COMMUNITY): Payer: Self-pay | Admitting: Cardiology

## 2020-02-21 DIAGNOSIS — I5022 Chronic systolic (congestive) heart failure: Secondary | ICD-10-CM | POA: Diagnosis not present

## 2020-02-21 LAB — CUP PACEART REMOTE DEVICE CHECK
Battery Remaining Longevity: 81 mo
Battery Remaining Percentage: 88 %
Battery Voltage: 2.98 V
Date Time Interrogation Session: 20210810023708
HighPow Impedance: 69 Ohm
Implantable Lead Implant Date: 20210209
Implantable Lead Implant Date: 20210209
Implantable Lead Implant Date: 20210209
Implantable Lead Location: 753858
Implantable Lead Location: 753859
Implantable Lead Location: 753860
Implantable Pulse Generator Implant Date: 20210209
Lead Channel Impedance Value: 390 Ohm
Lead Channel Impedance Value: 460 Ohm
Lead Channel Impedance Value: 780 Ohm
Lead Channel Pacing Threshold Amplitude: 0.75 V
Lead Channel Pacing Threshold Amplitude: 1.75 V
Lead Channel Pacing Threshold Pulse Width: 0.5 ms
Lead Channel Pacing Threshold Pulse Width: 0.5 ms
Lead Channel Sensing Intrinsic Amplitude: 12 mV
Lead Channel Sensing Intrinsic Amplitude: 4.1 mV
Lead Channel Setting Pacing Amplitude: 2.5 V
Lead Channel Setting Pacing Amplitude: 2.5 V
Lead Channel Setting Pacing Pulse Width: 0.5 ms
Lead Channel Setting Pacing Pulse Width: 0.5 ms
Lead Channel Setting Sensing Sensitivity: 0.5 mV
Pulse Gen Serial Number: 111006442

## 2020-02-22 ENCOUNTER — Ambulatory Visit (INDEPENDENT_AMBULATORY_CARE_PROVIDER_SITE_OTHER): Payer: Medicare HMO

## 2020-02-22 DIAGNOSIS — I5022 Chronic systolic (congestive) heart failure: Secondary | ICD-10-CM

## 2020-02-22 DIAGNOSIS — Z95810 Presence of automatic (implantable) cardiac defibrillator: Secondary | ICD-10-CM

## 2020-02-22 NOTE — Progress Notes (Signed)
Remote ICD transmission.   

## 2020-02-22 NOTE — Progress Notes (Signed)
EPIC Encounter for ICM Monitoring  Patient Name: [name] is a 69 y.o. male Date: 02/22/2020 Primary Care Physican: Seward Carol, MD Primary Cardiologist: Aundra Dubin Electrophysiologist: Allred Bi-V Pacing: >99%  02/22/2020 Weight: 273 lbs (previosly 270 lbs)      AT/AF Burden: 100% (taking Xarelto)  1st ICM Remote Transmission. Heart Failure questions reviewed.  Pt symptomatic with 3 lb weight gain. He report eating more calories such as snack foods which may be higher in salt.  He does not salt his foods.   CorVue thoracic impedance suggesting possible fluid accumulation starting 02/18/20.  Impedance also suggested possible fluid accumulation from 7/20-8/1.   Prescribed:   Torsemide 20 mg take 2 tablets (40 mg total) daily.  He reports Dr Rayann Heman approved of him taking extra Torsemide when needed.  Klor Con 20 mEq take 1 tablet twice a day  Spironolactone 25 mg take 1 tablet daily  Labs: 12/15/2019 Creatinine 1.38, BUN 46, Potassium 4.3, Sodium 138, GFR 53-61 09/27/2019 Creatinine 1.32, BUN 36, Potassium 4.7, Sodium 140, GFR 56->60  08/09/2019 Creatinine 1.39, BUN 38, Potassium 4.1, Sodium 139, GFR 52->60  A complete set of results can be found in Results Review.  Recommendations:  Patient reports he self adjusts Torsemide and will take extra tablet x 3 days.  Advised to limit salt intake.  Follow-up plan: ICM clinic phone appointment on 02/29/2020 (manual send) to recheck fluid levels.   91 day device clinic remote transmission 04/27/2020.    EP/Cardiology Office Visits: 03/23/2020 with Dr. Aundra Dubin and 04/27/2020 with Oda Kilts, PA.    Copy of ICM check sent to Dr. Rayann Heman and [name].   3 month ICM trend: 02/14/2020    1 Year ICM trend:       Rosalene Billings, RN 02/22/2020 10:57 AM

## 2020-02-26 NOTE — Progress Notes (Signed)
Thoracic impedance has been trending down for at least a month.  Would have him keep torsemide at 60 mg daily with BMET in 7 days.

## 2020-02-27 MED ORDER — TORSEMIDE 20 MG PO TABS
60.0000 mg | ORAL_TABLET | Freq: Every day | ORAL | 3 refills | Status: DC
Start: 2020-02-27 — End: 2020-04-23

## 2020-02-27 NOTE — Addendum Note (Signed)
Addended by: Rosalene Billings on: 02/27/2020 11:13 AM   Modules accepted: Orders, Level of Service

## 2020-02-27 NOTE — Progress Notes (Signed)
Spoke with patient. Advised [name] recommended he take Torsemide 60 mg daily and have BMET drawn in a week.  He reports self adjusting Torsemide to 80 mg several days and sent remote transmission on 8/14.  Weight returned to baseline of 270 lbs and BP was 115/65.  Transmission reviewed and advised fluid levels have returned to normal but he should start taking Torsemide 60 mg daily.  He verbalized understanding and has enough of Torsemide on hand.  BMET scheduled 03/06/2020 at 9:00AM.   Repeat remote transmission scheduled for 03/06/2020.  Advised if he has any changes or questions to call back.

## 2020-03-06 ENCOUNTER — Ambulatory Visit (INDEPENDENT_AMBULATORY_CARE_PROVIDER_SITE_OTHER): Payer: Medicare HMO

## 2020-03-06 ENCOUNTER — Ambulatory Visit (HOSPITAL_COMMUNITY)
Admission: RE | Admit: 2020-03-06 | Discharge: 2020-03-06 | Disposition: A | Discharge status: Home / Self Care | Payer: Medicare HMO | Source: Ambulatory Visit | Attending: Internal Medicine | Admitting: Internal Medicine

## 2020-03-06 ENCOUNTER — Other Ambulatory Visit: Payer: Self-pay

## 2020-03-06 DIAGNOSIS — I5022 Chronic systolic (congestive) heart failure: Secondary | ICD-10-CM | POA: Diagnosis not present

## 2020-03-06 DIAGNOSIS — Z95810 Presence of automatic (implantable) cardiac defibrillator: Secondary | ICD-10-CM

## 2020-03-06 LAB — BASIC METABOLIC PANEL
Anion gap: 10 (ref 5–15)
BUN: 42 mg/dL — ABNORMAL HIGH (ref 8–23)
CO2: 25 mmol/L (ref 22–32)
Calcium: 9.4 mg/dL (ref 8.9–10.3)
Chloride: 105 mmol/L (ref 98–111)
Creatinine, Ser: 1.42 mg/dL — ABNORMAL HIGH (ref 0.61–1.24)
GFR calc Af Amer: 59 mL/min — ABNORMAL LOW (ref >60)
GFR calc non Af Amer: 51 mL/min — ABNORMAL LOW (ref >60)
Glucose, Bld: 198 mg/dL — ABNORMAL HIGH (ref 70–99)
Potassium: 4.3 mmol/L (ref 3.5–5.1)
Sodium: 140 mmol/L (ref 135–145)

## 2020-03-06 NOTE — Progress Notes (Signed)
Take torsemide 80 mg daily x 2 days then back to 60 mg daily.  

## 2020-03-06 NOTE — Progress Notes (Signed)
EPIC Encounter for ICM Monitoring  Patient Name: [name] is a 69 y.o. male Date: 03/06/2020 Primary Care Physican: Seward Carol, MD Primary Cardiologist: Aundra Dubin Electrophysiologist: Allred Bi-V Pacing: >99%          02/22/2020 Weight: 273 lbs (previosly 270 lbs)                                                          AT/AF Burden: 100% (taking Xarelto)  Heart Failure questions reviewed.  He denies fluid symptoms.    He reports eating charcuterie foods that were high in salt and drinking more fluids on 8/22 which may have resulted in decreased impedance on 8/23   CorVue thoracic impedance suggesting possible fluid accumulation starting 03/05/20.  Impedance suggested possible dryness 8/12-8/22.   Prescribed:   Torsemide 20 mg take 3 tablets (60 mg total) daily.  He reports Dr Rayann Heman approved of him taking extra Torsemide when needed.  Klor Con 20 mEq take 1 tablet twice a day  Spironolactone 25 mg take 1 tablet daily  Labs: 03/06/2020 Creatinine 1.42, BUN 42, Potassium 4.3, Sodium 140, GFR 51-59 12/15/2019 Creatinine 1.38, BUN 46, Potassium 4.3, Sodium 138, GFR 53-61 09/27/2019 Creatinine 1.32, BUN 36, Potassium 4.7, Sodium 140, GFR 56->60  08/09/2019 Creatinine 1.39, BUN 38, Potassium 4.1, Sodium 139, GFR 52->60  A complete set of results can be found in Results Review.  Recommendations: Advised to limit salt and fluid intake.  Will send copy to [name] for review and recommendations if needed.  Follow-up plan: ICM clinic phone appointment on 03/14/2020 to recheck fluid levels.   91 day device clinic remote transmission 04/27/2020.    EP/Cardiology Office Visits: 03/23/2020 with Dr. Aundra Dubin and 04/27/2020 with Oda Kilts, PA.    Copy of ICM check sent to Dr. Rayann Heman and [name].   3 month ICM trend: 03/06/2020    1 Year ICM trend:       Rosalene Billings, RN 03/06/2020 2:19 PM

## 2020-03-07 ENCOUNTER — Telehealth: Payer: Self-pay

## 2020-03-07 DIAGNOSIS — M25551 Pain in right hip: Secondary | ICD-10-CM | POA: Diagnosis not present

## 2020-03-07 NOTE — Progress Notes (Signed)
Spoke with patient.  Advised [name] recommended to take torsemide 80 mg daily x 2 days then back to 60 mg daily. He verbalized understanding and agreed.

## 2020-03-07 NOTE — Progress Notes (Signed)
Attempted call to patient to provide Dr Claris Gladden recommendations and unable to leave a message.

## 2020-03-07 NOTE — Telephone Encounter (Signed)
I let the pt know [name] needed him to call her. He states he will call her in a few minutes.

## 2020-03-16 NOTE — Progress Notes (Signed)
No ICM remote transmission received for 03/14/2020 and next ICM transmission scheduled for 03/27/2020.

## 2020-03-20 ENCOUNTER — Other Ambulatory Visit (HOSPITAL_COMMUNITY): Payer: Self-pay | Admitting: Cardiology

## 2020-03-23 ENCOUNTER — Ambulatory Visit (HOSPITAL_COMMUNITY)
Admission: RE | Admit: 2020-03-23 | Discharge: 2020-03-23 | Disposition: A | Discharge status: Home / Self Care | Payer: Medicare HMO | Source: Ambulatory Visit | Attending: Cardiology | Admitting: Cardiology

## 2020-03-23 ENCOUNTER — Other Ambulatory Visit: Payer: Self-pay

## 2020-03-23 VITALS — BP 110/68 | HR 70 | Ht 70.0 in | Wt 273.0 lb

## 2020-03-23 DIAGNOSIS — Z8249 Family history of ischemic heart disease and other diseases of the circulatory system: Secondary | ICD-10-CM | POA: Insufficient documentation

## 2020-03-23 DIAGNOSIS — I482 Chronic atrial fibrillation, unspecified: Secondary | ICD-10-CM | POA: Insufficient documentation

## 2020-03-23 DIAGNOSIS — I428 Other cardiomyopathies: Secondary | ICD-10-CM | POA: Diagnosis not present

## 2020-03-23 DIAGNOSIS — I5022 Chronic systolic (congestive) heart failure: Secondary | ICD-10-CM

## 2020-03-23 DIAGNOSIS — I484 Atypical atrial flutter: Secondary | ICD-10-CM | POA: Insufficient documentation

## 2020-03-23 DIAGNOSIS — I4819 Other persistent atrial fibrillation: Secondary | ICD-10-CM

## 2020-03-23 DIAGNOSIS — I110 Hypertensive heart disease with heart failure: Secondary | ICD-10-CM | POA: Diagnosis not present

## 2020-03-23 DIAGNOSIS — I5032 Chronic diastolic (congestive) heart failure: Secondary | ICD-10-CM | POA: Diagnosis not present

## 2020-03-23 DIAGNOSIS — G4733 Obstructive sleep apnea (adult) (pediatric): Secondary | ICD-10-CM | POA: Diagnosis not present

## 2020-03-23 DIAGNOSIS — Z79899 Other long term (current) drug therapy: Secondary | ICD-10-CM | POA: Diagnosis not present

## 2020-03-23 DIAGNOSIS — Z7901 Long term (current) use of anticoagulants: Secondary | ICD-10-CM | POA: Insufficient documentation

## 2020-03-23 LAB — CBC
HCT: 41.9 % (ref 39.0–52.0)
Hemoglobin: 13.9 g/dL (ref 13.0–17.0)
MCH: 29.8 pg (ref 26.0–34.0)
MCHC: 33.2 g/dL (ref 30.0–36.0)
MCV: 89.9 fL (ref 80.0–100.0)
Platelets: 123 10*3/uL — ABNORMAL LOW (ref 150–400)
RBC: 4.66 MIL/uL (ref 4.22–5.81)
RDW: 13.1 % (ref 11.5–15.5)
WBC: 7.3 10*3/uL (ref 4.0–10.5)
nRBC: 0 % (ref 0.0–0.2)

## 2020-03-23 LAB — BASIC METABOLIC PANEL
Anion gap: 9 (ref 5–15)
BUN: 33 mg/dL — ABNORMAL HIGH (ref 8–23)
CO2: 29 mmol/L (ref 22–32)
Calcium: 9.3 mg/dL (ref 8.9–10.3)
Chloride: 100 mmol/L (ref 98–111)
Creatinine, Ser: 1.44 mg/dL — ABNORMAL HIGH (ref 0.61–1.24)
GFR calc Af Amer: 58 mL/min — ABNORMAL LOW (ref >60)
GFR calc non Af Amer: 50 mL/min — ABNORMAL LOW (ref >60)
Glucose, Bld: 78 mg/dL (ref 70–99)
Potassium: 4 mmol/L (ref 3.5–5.1)
Sodium: 138 mmol/L (ref 135–145)

## 2020-03-23 MED ORDER — ENTRESTO 49-51 MG PO TABS
1.0000 | ORAL_TABLET | Freq: Two times a day (BID) | ORAL | 6 refills | Status: DC
Start: 2020-03-23 — End: 2020-06-28

## 2020-03-23 NOTE — Patient Instructions (Signed)
CHANGE Entresto to 49/51 mg, one tab twice a day  Labs today We will only contact you if something comes back abnormal or we need to make some changes. Otherwise no news is good news!  Your physician recommends that you schedule a follow-up appointment in: 3 months with [name]  If you have any questions or concerns before your next appointment please send [place] a message through [facility] or call our office at [phone].    TO LEAVE A MESSAGE FOR THE NURSE SELECT OPTION 2, PLEASE LEAVE A MESSAGE INCLUDING: . YOUR NAME . DATE OF BIRTH . CALL BACK NUMBER . REASON FOR CALL**this is important as we prioritize the call backs  YOU WILL RECEIVE A CALL BACK THE SAME DAY AS LONG AS YOU CALL BEFORE 4:00 PM

## 2020-03-25 NOTE — Progress Notes (Signed)
PCP: Seward Carol, MD EP: Dr. Rayann Heman Cardiology: Dr. Gwenlyn Found HF Cardiology: Dr. Aundra Dubin  69 y.o. with history of chronic atrial fibrillation and flutter with failed Maze procedure, valvular heart disease s/p mitral and aortic valve repairs, and chronic systolic CHF was referred by Dr. Rayann Heman for CHF evaluation.  Patient has a long history of atrial fibrillation.  He was initially controlled by Tikosyn but it eventually became chronic.  He had significant mitral and aortic regurgitation, and in 11/19, he had mitral and aortic valve repairs with Maze and LA appendage ligation. Cath prior to valve surgery in 10/19 showed no significant coronary disease.  It appears from ECGs that he never went back into NSR.  He has had slow atrial fibrillation with rate in upper 30s-40s at times, so was never started on amiodarone.  Last echo in 4/20 showed EF 40-45% with stable aortic and mitral valve repairs.  He has developed worsening volume overload, and Lasix has been titrated up to 80 mg bid.  Most recently, he went into atypical atrial flutter with HR consistently in the 110s range.  He therefore was taken for DCCV 03/10/19.  Initially rhythm looked junctional in the 40s-50s, but he subsequently went back into rapid atypical atrial flutter with elevated rate (>100 bpm).   Echo was done in 9/20, showing EF worse at 20-25%.     He saw Dr. Rayann Heman and had atrial flutter + atrial fibrillation ablation in 10/20.  Post-procedure, he was noted to be in slow atrial fibrillation (rate 40s) at time of his atrial fibrillation clinic followup.   Patient had St Jude CRT-D implantation in 2/21.  In 6/21, he had AV nodal ablation to allow CRT given recurrent atrial flutter/fibrillation.   He returns today for followup of CHF.  He is doing well symptomatically.  Able to do 4 hrs/day of yardwork.  No problems digging post holes for a fence.  No significant exertional dyspnea walking on flat ground.  No problems walking up stairs.   Does get winded with a steep hill.  No chest pain.  No lightheadedness.   ECG (personally reviewed): Atypical flutter with BiV pacing  Labs (8/20): K 3.9, creatinine 1.14, BNP 473, hgb 13.3 Labs (9/20): K 3.3 => 3.7, creatinine 1.35 => 1.3 Labs (12/20): K 4.1, creatinine 1.2 Labs (1/21): K 4.1, creatinine 1.39 Labs (8/21): K 4.3, creatinine 1.42, plts 136  PMH: 1. HTN 2. Paroxysmal atrial fibrillation/flutter: Failed Tikosyn.  Had Maze procedure 11/19 but does not appear to have ever gone back into NSR.  Had been in atrial fibrillation with bradycardia with rate in 30s-40s at times, now in atypical atrial flutter primarily with rate 100s-110s.  He failed DCCV 8/20.  No amiodarone due to h/o bradycardia.  - Atrial fibrillation + atrial flutter ablation in 10/20.  3. OSA: Uses CPAP.  4. Chronic systolic CHF: Nonischemic cardiomyopathy.  - 10/19 LHC: No coronary disease.  - Echo (4/20): EF 40-45%, mild LVH, moderate dilated RV with mildly decreased systolic function, s/p MV repair with no significant stenosis or regurgitation, s/p aortic valve repair with mild AI, mild aortic stenosis.  Dilated IVC.  - Echo (9/20): EF 20-25%, mild LVH with moderate LV dilation, s/p MV repair with mean gradient 6, s/p AoV repair with mean gradient 8. No significant MR or AI.  - St Jude CRT-D implantation in 2/21.  - AV nodal ablation 6/21.  5. Valvular heart disease: Patient has history of MR and AI.  In 11/19, he had mitral valve  repair, aortic valve repair, surgical Maze, and LA appendage excision.  6. LBBB  Social History   Socioeconomic History   Marital status: Married    Spouse name: Not on file   Number of children: Not on file   Years of education: Not on file   Highest education level: Not on file  Occupational History   Occupation: Agricultural consultant  Tobacco Use   Smoking status: Never Smoker   Smokeless tobacco: Never Used  Scientific laboratory technician Use: Never used   Substance and Sexual Activity   Alcohol use: No   Drug use: No   Sexual activity: Yes  Other Topics Concern   Not on file  Social History Narrative   Pt lives in [place] with spouse.  Leadership training and behavioral safety training.   Social Determinants of Health   Financial Resource Strain:    Difficulty of Paying Living Expenses: Not on file  Food Insecurity:    Worried About Charity fundraiser in the Last Year: Not on file   [employer] of Food in the Last Year: Not on file  Transportation Needs: No Transportation Needs   Lack of Transportation (Medical): No   Lack of Transportation (Non-Medical): No  Physical Activity:    Days of Exercise per Week: Not on file   Minutes of Exercise per Session: Not on file  Stress:    Feeling of Stress : Not on file  Social Connections:    Frequency of Communication with Friends and Family: Not on file   Frequency of Social Gatherings with Friends and Family: Not on file   Attends Religious Services: Not on file   Active Member of Clubs or Organizations: Not on file   Attends Archivist Meetings: Not on file   Marital Status: Not on file  Intimate Partner Violence:    Fear of Current or Ex-Partner: Not on file   Emotionally Abused: Not on file   Physically Abused: Not on file   Sexually Abused: Not on file   Family History  Problem Relation Age of Onset   Other Other        mother had a pacemaker   Hypertension Father    ROS: All systems reviewed and negative except as per HPI.   Current Outpatient Medications  Medication Sig Dispense Refill   acetaminophen (TYLENOL) 325 MG tablet Take 2 tablets (650 mg total) by mouth every 4 (four) hours as needed for headache or mild pain.     carvedilol (COREG) 6.25 MG tablet Take 1 tablet (6.25 mg total) by mouth 2 (two) times daily. 180 tablet 3   clobetasol ointment (TEMOVATE) 5.85 % Apply 1 application topically daily as needed (rash).      diclofenac Sodium (VOLTAREN) 1 % GEL Apply 1 application topically as needed.      KLOR-CON M20 20 MEQ tablet Take 1 tablet (20 mEq total) by mouth 2 (two) times daily. 60 tablet 11   Multiple Vitamin (MULTIVITAMIN WITH MINERALS) TABS tablet Take 1 tablet by mouth daily.      Naproxen Sod-diphenhydrAMINE (ALEVE PM) 220-25 MG TABS Take 2 tablets by mouth at bedtime as needed (sleep/pain).     Omega-3 Fatty Acids (FISH OIL PO) Take 1 capsule by mouth 4 (four) times a week.      OVER THE COUNTER MEDICATION Apply 1 spray topically daily as needed (cramps). Topical Magnesium Spray     Probiotic Product (PROBIOTIC PO) Take 1 capsule by  mouth daily.     spironolactone (ALDACTONE) 25 MG tablet TAKE 1 TABLET BY MOUTH EVERY DAY 90 tablet 1   torsemide (DEMADEX) 20 MG tablet Take 3 tablets (60 mg total) by mouth daily. 270 tablet 3   XARELTO 20 MG TABS tablet TAKE ONE TABLET BY MOUTH DAILY WITH SUPPER 90 tablet 2   HYDROcodone-acetaminophen (NORCO/VICODIN) 5-325 MG tablet Take 1 tablet by mouth every 12 (twelve) hours as needed for moderate pain. (Patient not taking: Reported on 03/23/2020) 5 tablet 0   ivermectin (STROMECTOL) 3 MG TABS tablet Take by mouth. (Patient not taking: Reported on 03/23/2020)     sacubitril-valsartan (ENTRESTO) 49-51 MG Take 1 tablet by mouth 2 (two) times daily. 60 tablet 6   No current facility-administered medications for this encounter.   BP 110/68    Pulse 70    Ht 5\' 10"  (1.778 m)    Wt 123.8 kg (273 lb)    SpO2 95%    BMI 39.17 kg/m  General: NAD Neck: No JVD, no thyromegaly or thyroid nodule.  Lungs: Clear to auscultation bilaterally with normal respiratory effort. CV: Nondisplaced PMI.  Heart regular S1/S2, no S3/S4, no murmur.  No peripheral edema.  No carotid bruit.  Normal pedal pulses.  Abdomen: Soft, nontender, no hepatosplenomegaly, no distention.  Skin: Intact without lesions or rashes.  Neurologic: Alert and oriented x 3.  Psych: Normal  affect. Extremities: No clubbing or cyanosis.  HEENT: Normal.   Assessment/Plan: 1. Chronic systolic CHF: Echo in 0/09 with EF 40-45%, moderate RV dilation/mildly decreased RV function.  Nonischemic cardiomyopathy, cath in 10/19 without significant coronary disease.  Repeat echo in 9/20 showed fall in EF to 20-25% in the setting of atrial fibrillation and atrial flutter with persistent tachycardia.  He is now s/p St Jude CRT-D and AV nodal ablation.  NYHA class II symptoms, no volume overload on exam.   - Continue torsemide 60 mg daily.  BMET today.  - Continue spironolactone 25 mg daily.   - Continue Entresto 49/51 bid (had to decrease from 97/103 due to soft BP).   - Continue Coreg 6.25 mg bid.  - I will arrange for repeat echo. If EF remains low, we will try increasing Entresto again. We discussed SGLT2 inhibitor, but he wants to hold off on this.  2. Atrial arrhythmias: Long history of chronic atrial fibrillation, has had slow ventricular response in the past (HR 40s at times). He failed Tikosyn and have avoided amiodarone with bradycardia.  He had Maze procedure in 11/19 but it was not successful.  At initial appointment with me, he was in atypical flutter with HR 110s.  He failed DCCV in 8/20.  He had flutter/fibrillation ablation in 10/20.  Initially after ablation, he was in slow atrial fibrillation (rate upper 30s-50s generally).  He then went into atypical atrial flutter with rate 100s.  He is now s/p AV nodal ablation with CRT-D.  His underlying rhythm appears to be atypical atrial flutter.  - He will continue Xarelto.  CBC today.  3. OSA: Continue CPAP.  4. Valvular heart disease: S/p MV and AoV repairs in 11/19.  Valves looked stable on last echo in 9/20.  - He will need antibiotic prophylaxis with dental work.   Followup in 3 months.   [name] 03/25/2020

## 2020-04-01 ENCOUNTER — Other Ambulatory Visit (HOSPITAL_COMMUNITY): Payer: Self-pay | Admitting: Cardiology

## 2020-04-02 NOTE — Progress Notes (Signed)
No ICM remote transmission received for 03/27/2020 and next ICM transmission scheduled for 05/08/2020.

## 2020-04-06 NOTE — Addendum Note (Signed)
Encounter addended by: Harvie Junior, CMA on: 04/06/2020 9:47 AM  Actions taken: Charge Capture section accepted

## 2020-04-07 ENCOUNTER — Other Ambulatory Visit (HOSPITAL_COMMUNITY): Payer: Self-pay | Admitting: Cardiology

## 2020-04-11 ENCOUNTER — Other Ambulatory Visit (HOSPITAL_COMMUNITY): Payer: Self-pay | Admitting: Physician Assistant

## 2020-04-11 ENCOUNTER — Other Ambulatory Visit: Payer: Self-pay | Admitting: Physician Assistant

## 2020-04-11 DIAGNOSIS — Z96651 Presence of right artificial knee joint: Secondary | ICD-10-CM

## 2020-04-11 DIAGNOSIS — M25551 Pain in right hip: Secondary | ICD-10-CM | POA: Diagnosis not present

## 2020-04-11 DIAGNOSIS — Z471 Aftercare following joint replacement surgery: Secondary | ICD-10-CM | POA: Diagnosis not present

## 2020-04-18 ENCOUNTER — Ambulatory Visit (HOSPITAL_COMMUNITY)
Admission: RE | Admit: 2020-04-18 | Discharge: 2020-04-18 | Disposition: A | Discharge status: Home / Self Care | Payer: Medicare HMO | Source: Ambulatory Visit | Attending: Physician Assistant | Admitting: Physician Assistant

## 2020-04-18 ENCOUNTER — Encounter (HOSPITAL_COMMUNITY)
Admission: RE | Admit: 2020-04-18 | Discharge: 2020-04-18 | Disposition: A | Discharge status: Home / Self Care | Payer: Medicare HMO | Source: Ambulatory Visit | Attending: Physician Assistant | Admitting: Physician Assistant

## 2020-04-18 ENCOUNTER — Other Ambulatory Visit: Payer: Self-pay

## 2020-04-18 DIAGNOSIS — Z96651 Presence of right artificial knee joint: Secondary | ICD-10-CM | POA: Diagnosis present

## 2020-04-18 DIAGNOSIS — M25561 Pain in right knee: Secondary | ICD-10-CM | POA: Diagnosis not present

## 2020-04-18 MED ORDER — TECHNETIUM TC 99M MEDRONATE IV KIT
20.0000 | PACK | Freq: Once | INTRAVENOUS | Status: AC | PRN
  Administered 2020-04-18: 20 via INTRAVENOUS

## 2020-04-20 ENCOUNTER — Ambulatory Visit (HOSPITAL_COMMUNITY): Payer: Medicare HMO | Attending: Cardiology

## 2020-04-20 ENCOUNTER — Other Ambulatory Visit: Payer: Self-pay

## 2020-04-20 DIAGNOSIS — I484 Atypical atrial flutter: Secondary | ICD-10-CM | POA: Diagnosis present

## 2020-04-20 DIAGNOSIS — I428 Other cardiomyopathies: Secondary | ICD-10-CM | POA: Diagnosis not present

## 2020-04-20 DIAGNOSIS — I10 Essential (primary) hypertension: Secondary | ICD-10-CM | POA: Diagnosis not present

## 2020-04-20 DIAGNOSIS — I447 Left bundle-branch block, unspecified: Secondary | ICD-10-CM

## 2020-04-20 DIAGNOSIS — I4819 Other persistent atrial fibrillation: Secondary | ICD-10-CM | POA: Diagnosis present

## 2020-04-20 DIAGNOSIS — M25551 Pain in right hip: Secondary | ICD-10-CM | POA: Diagnosis not present

## 2020-04-20 DIAGNOSIS — M1611 Unilateral primary osteoarthritis, right hip: Secondary | ICD-10-CM | POA: Diagnosis not present

## 2020-04-20 LAB — ECHOCARDIOGRAM COMPLETE
AR max vel: 2.15 cm2
AV Area VTI: 2.26 cm2
AV Area mean vel: 1.96 cm2
AV Mean grad: 9 mmHg
AV Peak grad: 17.6 mmHg
Ao pk vel: 2.1 m/s
Area-P 1/2: 2.26 cm2
S' Lateral: 3.7 cm

## 2020-04-21 ENCOUNTER — Other Ambulatory Visit (HOSPITAL_COMMUNITY): Payer: Self-pay | Admitting: Cardiology

## 2020-04-22 DIAGNOSIS — G4733 Obstructive sleep apnea (adult) (pediatric): Secondary | ICD-10-CM | POA: Diagnosis not present

## 2020-04-23 ENCOUNTER — Ambulatory Visit (INDEPENDENT_AMBULATORY_CARE_PROVIDER_SITE_OTHER): Payer: Medicare HMO

## 2020-04-23 DIAGNOSIS — I5032 Chronic diastolic (congestive) heart failure: Secondary | ICD-10-CM | POA: Diagnosis not present

## 2020-04-23 DIAGNOSIS — Z95810 Presence of automatic (implantable) cardiac defibrillator: Secondary | ICD-10-CM | POA: Diagnosis not present

## 2020-04-23 NOTE — Progress Notes (Signed)
EPIC Encounter for ICM Monitoring  Patient Name: [name] is a 69 y.o. male Date: 04/23/2020 Primary Care Physican: Seward Carol, MD Primary Cardiologist:[place] Electrophysiologist:Allred Bi-V Pacing:>99% 02/22/2020 Weight:273lbs (previosly 270 lbs)  AT/AF Burden: 100% (taking Xarelto)  Heart Failure questions reviewed and he is feeling well at this time.  Denies fluid symptoms.    CorVue thoracic impedance suggesting normal fluid levels.  Prescribed:   Torsemide20 mg take 3 tablets (60 mg total) daily. He reports Dr Rayann Heman approved of him taking extra Torsemide when needed.  Klor Con 20 mEq take 3 tablets by mouth daily  Spironolactone 25 mg take 0.5 tablet (12.5 mg total) daily  Labs: 03/23/2020 Creatinine 1.44, BUN 33, Potassium 4.0, Sodium 138, GFR 50-58 03/06/2020 Creatinine 1.42, BUN 42, Potassium 4.3, Sodium 140, GFR 51-59 12/15/2019 Creatinine1.38, BUN46, Potassium4.3, OUZHQU047, GFR53-61 09/27/2019 Creatinine1.32, BUN36, Potassium4.7, Sodium140, GFR56->60  08/09/2019 Creatinine1.39, BUN38, Potassium4.1, Sodium139, GFR52->60 A complete set of results can be found in Results Review.  Recommendations:No changes and encouraged to call if experiencing any fluid symptoms.  Follow-up plan: ICM clinic phone appointment on11/05/2020. 91 day device clinic remote transmission 05/22/2020.   EP/Cardiology Office Visits:04/27/2020 with Oda Kilts, PA.   Copy of ICM check sent to Dr.Allred.   3 month ICM trend: 04/23/2020    1 Year ICM trend:       Rosalene Billings, RN 04/23/2020 12:09 PM

## 2020-04-27 ENCOUNTER — Telehealth: Payer: Self-pay | Admitting: *Deleted

## 2020-04-27 ENCOUNTER — Other Ambulatory Visit: Payer: Self-pay

## 2020-04-27 ENCOUNTER — Encounter: Payer: Self-pay | Admitting: Student

## 2020-04-27 ENCOUNTER — Ambulatory Visit: Payer: Medicare HMO | Admitting: Student

## 2020-04-27 VITALS — BP 104/58 | HR 73 | Ht 70.0 in | Wt 274.0 lb

## 2020-04-27 DIAGNOSIS — I5022 Chronic systolic (congestive) heart failure: Secondary | ICD-10-CM | POA: Diagnosis not present

## 2020-04-27 DIAGNOSIS — I4819 Other persistent atrial fibrillation: Secondary | ICD-10-CM | POA: Diagnosis not present

## 2020-04-27 DIAGNOSIS — M25551 Pain in right hip: Secondary | ICD-10-CM | POA: Diagnosis not present

## 2020-04-27 DIAGNOSIS — I10 Essential (primary) hypertension: Secondary | ICD-10-CM | POA: Diagnosis not present

## 2020-04-27 DIAGNOSIS — Z95810 Presence of automatic (implantable) cardiac defibrillator: Secondary | ICD-10-CM

## 2020-04-27 LAB — CUP PACEART INCLINIC DEVICE CHECK
Battery Remaining Longevity: 70 mo
Brady Statistic RA Percent Paced: 0 %
Brady Statistic RV Percent Paced: 99.8 %
Date Time Interrogation Session: 20211015140537
HighPow Impedance: 74.25 Ohm
Implantable Lead Implant Date: 20210209
Implantable Lead Implant Date: 20210209
Implantable Lead Implant Date: 20210209
Implantable Lead Location: 753858
Implantable Lead Location: 753859
Implantable Lead Location: 753860
Implantable Pulse Generator Implant Date: 20210209
Lead Channel Impedance Value: 387.5 Ohm
Lead Channel Impedance Value: 475 Ohm
Lead Channel Impedance Value: 837.5 Ohm
Lead Channel Pacing Threshold Amplitude: 0.5 V
Lead Channel Pacing Threshold Amplitude: 0.75 V
Lead Channel Pacing Threshold Amplitude: 0.75 V
Lead Channel Pacing Threshold Amplitude: 2 V
Lead Channel Pacing Threshold Amplitude: 2 V
Lead Channel Pacing Threshold Pulse Width: 0.5 ms
Lead Channel Pacing Threshold Pulse Width: 0.5 ms
Lead Channel Pacing Threshold Pulse Width: 0.5 ms
Lead Channel Pacing Threshold Pulse Width: 0.5 ms
Lead Channel Pacing Threshold Pulse Width: 0.5 ms
Lead Channel Sensing Intrinsic Amplitude: 4.7 mV
Lead Channel Sensing Intrinsic Amplitude: 9.2 mV
Lead Channel Setting Pacing Amplitude: 2.5 V
Lead Channel Setting Pacing Amplitude: 2.75 V
Lead Channel Setting Pacing Pulse Width: 0.5 ms
Lead Channel Setting Pacing Pulse Width: 0.5 ms
Lead Channel Setting Sensing Sensitivity: 0.5 mV
Pulse Gen Serial Number: 111006442

## 2020-04-27 NOTE — Patient Instructions (Addendum)
Medication Instructions:  Your physician recommends that you continue on your current medications as directed. Please refer to the Current Medication list given to you today. *If you need a refill on your cardiac medications before your next appointment, please call your pharmacy*  Lab Work: None ordered. If you have labs (blood work) drawn today and your tests are completely normal, you will receive your results only by: Marland Kitchen MyChart Message (if you have MyChart) OR . A paper copy in the mail If you have any lab test that is abnormal or we need to change your treatment, we will call you to review the results.  Testing/Procedures: None ordered.  Follow-Up: At [facility], you and your health needs are our priority.  As part of our continuing mission to provide you with exceptional heart care, we have created designated Provider Care Teams.  These Care Teams include your primary Cardiologist (physician) and Advanced Practice Providers (APPs -  Physician Assistants and Nurse Practitioners) who all work together to provide you with the care you need, when you need it.  Your next appointment:   Your physician wants you to follow-up in: 6 months with Dr. Rayann Heman or EP APP.    You will receive a reminder letter in the mail two months in advance. If you don't receive a letter, please call our office to schedule the follow-up appointment.  Remote monitoring is used to monitor your ICD from home. This monitoring reduces the number of office visits required to check your device to one time per year. It allows [place] to keep an eye on the functioning of your device to ensure it is working properly. You are scheduled for a device check from home on 05/22/2020. You may send your transmission at any time that day. If you have a wireless device, the transmission will be sent automatically. After your physician reviews your transmission, you will receive a postcard with your next transmission date.

## 2020-04-27 NOTE — Progress Notes (Signed)
Electrophysiology Office Note Date: 04/27/2020  ID:  [name], DOB 07/15/1950, MRN [id number]  PCP: Seward Carol, MD Primary Cardiologist: Quay Burow, MD Electrophysiologist: Thompson Grayer, MD   CC: Routine ICD follow-up  [name] is a 69 y.o. male seen today for Thompson Grayer, MD for cardiac clearance.  Since last being seen in our clinic the patient reports doing very well. He is very relieved his EF has improved. He is having issues with his hip, and is being worked up for total hip replacement. He denies chest pain, palpitations, dyspnea, PND, orthopnea, nausea, vomiting, dizziness, syncope, edema, weight gain, or early satiety. He has not had ICD shocks.   Device History: St. Jude BiV ICD implanted 08/23/2019 for NICM History of appropriate therapy: No History of AAD therapy: No   Past Medical History:  Diagnosis Date   Aortic insufficiency 04/21/2018   Aortic insufficiency    Arthritis    "joints" (09/26/2013)   Atrial enlargement, left    Dyspnea    with exertion   Hearing aid worn    both ears   Hypertension    Mitral regurgitation 04/21/2018   Obesity    OSA on CPAP    "mask adjusts to what I need" (09/26/2013)   Persistent atrial fibrillation (HCC)    cardioversion 06/06/13   PONV (postoperative nausea and vomiting)    Rotator cuff tear, right dec 2012   physical therapy done, decreased strength   S/P aortic valve repair 05/18/2018   Complex valvuloplasty including plication of right coronary leaflet and 21 mm Biostable HAART annuloplasty ring   S/P Maze operation for atrial fibrillation 05/18/2018   Complete bilateral atrial lesion set using bipolar radiofrequency and cryothermy ablation with clipping of LA appendage   S/P mitral valve repair 05/18/2018   Complex valvuloplasty including artificial Gore-tex neochord placement x4 and 30 mm Sorin Memo 4D ring annuloplasty   Wears glasses    Past Surgical History:  Procedure  Laterality Date   AORTIC VALVE REPAIR N/A 05/18/2018   Procedure: AORTIC VALVE REPAIR using HAART 300 Aortic Annuloplasty Device size 31mm;  Surgeon: Rexene Alberts, MD;  Location: [place];  Service: Open Heart Surgery;  Laterality: N/A;   ATRIAL FIBRILLATION ABLATION N/A 04/26/2019   Procedure: ATRIAL FIBRILLATION ABLATION;  Surgeon: Thompson Grayer, MD;  Location: [place] [facility];  Service: Cardiovascular;  Laterality: N/A;   AV NODE ABLATION N/A 12/20/2019   Procedure: AV NODE ABLATION;  Surgeon: Thompson Grayer, MD;  Location: [place] [facility];  Service: Cardiovascular;  Laterality: N/A;   BIV ICD INSERTION CRT-D N/A 08/23/2019   Procedure: BIV ICD INSERTION CRT-D;  Surgeon: Thompson Grayer, MD;  Location: [place] [facility];  Service: Cardiovascular;  Laterality: N/A;   CARDIOVERSION N/A 06/06/2013   Procedure: CARDIOVERSION;  Surgeon: Sanda Klein, MD;  Location: [place] [facility];  Service: Cardiovascular;  Laterality: N/A;   CARDIOVERSION N/A 09/28/2013   Procedure: CARDIOVERSION BEDSIDE;  Surgeon: Peter M Martinique, MD;  Location: [place];  Service: Cardiovascular;  Laterality: N/A;   CARDIOVERSION N/A 11/30/2018   Procedure: CARDIOVERSION;  Surgeon: Dorothy Spark, MD;  Location: [facility];  Service: Cardiovascular;  Laterality: N/A;   CARDIOVERSION N/A 03/10/2019   Procedure: CARDIOVERSION;  Surgeon: [name], MD;  Location: [place];  Service: Endoscopy;  Laterality: N/A;   CARPAL TUNNEL RELEASE Right 08/08/2013   Procedure: RIGHT CARPAL TUNNEL RELEASE;  Surgeon: Wynonia Sours, MD;  Location: [place];  Service: Orthopedics;  Laterality: Right;   CARPAL TUNNEL RELEASE Left 2008   CLIPPING OF ATRIAL APPENDAGE  05/18/2018   Procedure: CLIPPING OF ATRIAL APPENDAGE using AtriCure Clip size 45;  Surgeon: Rexene Alberts, MD;  Location: [place];  Service: Open Heart Surgery;;   KNEE ARTHROSCOPY Right 1990's   MAZE N/A 05/18/2018   Procedure: MAZE;   Surgeon: Rexene Alberts, MD;  Location: [place];  Service: Open Heart Surgery;  Laterality: N/A;   MITRAL VALVE REPAIR N/A 05/18/2018   Procedure: MITRAL VALVE REPAIR (MVR) using 4D Memo Ring size 30;  Surgeon: Rexene Alberts, MD;  Location: [place];  Service: Open Heart Surgery;  Laterality: N/A;   RIGHT/LEFT HEART CATH AND CORONARY ANGIOGRAPHY N/A 04/21/2018   Procedure: RIGHT/LEFT HEART CATH AND CORONARY ANGIOGRAPHY;  Surgeon: Nelva Bush, MD;  Location: [place] [facility];  Service: Cardiovascular;  Laterality: N/A;   SHOULDER OPEN ROTATOR CUFF REPAIR Left 1990's   SHOULDER OPEN ROTATOR CUFF REPAIR Right 11/2007   TEE WITHOUT CARDIOVERSION N/A 04/21/2018   Procedure: TRANSESOPHAGEAL ECHOCARDIOGRAM (TEE);  Surgeon: Jerline Pain, MD;  Location: [facility]OSCOPY;  Service: Cardiovascular;  Laterality: N/A;   TEE WITHOUT CARDIOVERSION N/A 05/18/2018   Procedure: TRANSESOPHAGEAL ECHOCARDIOGRAM (TEE);  Surgeon: Rexene Alberts, MD;  Location: [place];  Service: Open Heart Surgery;  Laterality: N/A;   TONSILLECTOMY  1950's   TOTAL HIP ARTHROPLASTY Left 2010   TOTAL HIP REVISION Left 10/08/2011   TOTAL HIP REVISION  04/09/2012   Procedure: TOTAL HIP REVISION;  Surgeon: Gearlean Alf, MD;  [facility];  Service: Orthopedics;  Laterality: Left;  Left Hip Acetabular Revision vs Constrained Liner   TOTAL KNEE ARTHROPLASTY Right 2006    TRIGGER FINGER RELEASE Right 08/08/2013   Procedure: RELEASE STENOSING TENOSYNOVITIS RIGHT THUMB;  Surgeon: Wynonia Sours, MD;  Location: [place];  Service: Orthopedics;  Laterality: Right;    Current Outpatient Medications  Medication Sig Dispense Refill   acetaminophen (TYLENOL) 325 MG tablet Take 2 tablets (650 mg total) by mouth every 4 (four) hours as needed for headache or mild pain.     carvedilol (COREG) 6.25 MG tablet Take 1 tablet (6.25 mg total) by mouth 2 (two) times daily. 180 tablet 3   clobetasol ointment (TEMOVATE) 1.88  % Apply 1 application topically daily as needed (rash).     diclofenac Sodium (VOLTAREN) 1 % GEL Apply 1 application topically as needed.      HYDROcodone-acetaminophen (NORCO/VICODIN) 5-325 MG tablet Take 1 tablet by mouth every 12 (twelve) hours as needed for moderate pain. 5 tablet 0   ivermectin (STROMECTOL) 3 MG TABS tablet Take by mouth.      KLOR-CON M20 20 MEQ tablet TAKE 3 TABLETS BY MOUTH EVERY DAY 270 tablet 3   Multiple Vitamin (MULTIVITAMIN WITH MINERALS) TABS tablet Take 1 tablet by mouth daily.      Naproxen Sod-diphenhydrAMINE (ALEVE PM) 220-25 MG TABS Take 2 tablets by mouth at bedtime as needed (sleep/pain).     Omega-3 Fatty Acids (FISH OIL PO) Take 1 capsule by mouth 4 (four) times a week.      OVER THE COUNTER MEDICATION Apply 1 spray topically daily as needed (cramps). Topical Magnesium Spray     Probiotic Product (PROBIOTIC PO) Take 1 capsule by mouth daily.     sacubitril-valsartan (ENTRESTO) 49-51 MG Take 1 tablet by mouth 2 (two) times daily. 60 tablet 6   spironolactone (ALDACTONE) 25 MG tablet TAKE  1/2 TABLET BY MOUTH EVERY DAY 45 tablet 3   torsemide (DEMADEX) 20 MG tablet Take 3 tablets (60 mg total) by mouth daily. 270 tablet 2   XARELTO 20 MG TABS tablet TAKE ONE TABLET BY MOUTH DAILY WITH SUPPER 90 tablet 2   No current facility-administered medications for this visit.    Allergies:   Metoprolol tartrate and Oxycodone   Social History: Social History   Socioeconomic History   Marital status: Married    Spouse name: Not on file   Number of children: Not on file   Years of education: Not on file   Highest education level: Not on file  Occupational History   Occupation: Agricultural consultant  Tobacco Use   Smoking status: Never Smoker   Smokeless tobacco: Never Used  Scientific laboratory technician Use: Never used  Substance and Sexual Activity   Alcohol use: No   Drug use: No   Sexual activity: Yes  Other Topics  Concern   Not on file  Social History Narrative   Pt lives in [place] with spouse.  Leadership training and behavioral safety training.   Social Determinants of Health   Financial Resource Strain:    Difficulty of Paying Living Expenses: Not on file  Food Insecurity:    Worried About Charity fundraiser in the Last Year: Not on file   [employer] of Food in the Last Year: Not on file  Transportation Needs: No Transportation Needs   Lack of Transportation (Medical): No   Lack of Transportation (Non-Medical): No  Physical Activity:    Days of Exercise per Week: Not on file   Minutes of Exercise per Session: Not on file  Stress:    Feeling of Stress : Not on file  Social Connections:    Frequency of Communication with Friends and Family: Not on file   Frequency of Social Gatherings with Friends and Family: Not on file   Attends Religious Services: Not on file   Active Member of Clubs or Organizations: Not on file   Attends Archivist Meetings: Not on file   Marital Status: Not on file  Intimate Partner Violence:    Fear of Current or Ex-Partner: Not on file   Emotionally Abused: Not on file   Physically Abused: Not on file   Sexually Abused: Not on file    Family History: Family History  Problem Relation Age of Onset   Other Other        mother had a pacemaker   Hypertension Father     Review of Systems: All other systems reviewed and are otherwise negative except as noted above.   Physical Exam: Vitals:   04/27/20 0956  BP: (!) 104/58  Pulse: 73  SpO2: 95%  Weight: 274 lb (124.3 kg)  Height: 5\' 10"  (1.778 m)     GEN- The patient is well appearing, alert and oriented x 3 today.   HEENT: normocephalic, atraumatic; sclera clear, conjunctiva pink; hearing intact; oropharynx clear; neck supple, no JVP Lymph- no cervical lymphadenopathy Lungs- Clear to ausculation bilaterally, normal work of breathing.  No wheezes, rales,  rhonchi Heart- Regular rate and rhythm, no murmurs, rubs or gallops, PMI not laterally displaced GI- soft, non-tender, non-distended, bowel sounds present, no hepatosplenomegaly Extremities- no clubbing or cyanosis. No edema; DP/PT/radial pulses 2+ bilaterally MS- no significant deformity or atrophy Skin- warm and dry, no rash or lesion; ICD pocket well healed Psych- euthymic mood, full affect Neuro- strength  and sensation are intact  ICD interrogation- reviewed in detail today,  See PACEART report  EKG:  EKG is not ordered today.  Recent Labs: 03/23/2020: BUN 33; Creatinine, Ser 1.44; Hemoglobin 13.9; Platelets 123; Potassium 4.0; Sodium 138   Wt Readings from Last 3 Encounters:  04/27/20 274 lb (124.3 kg)  03/23/20 273 lb (123.8 kg)  01/30/20 268 lb (121.6 kg)     Other studies Reviewed: Additional studies/ records that were reviewed today include: Previous EP office notes, most recent Echo.    Assessment and Plan:  1.  Chronic systolic dysfunction s/p St. Jude CRT-D  Echo 04/20/2020 showed EF has normalized to 60-65% s/p AV nodal ablation. euvolemic today Stable on an appropriate medical regimen Normal ICD function See Pace Art report No changes today  2. Atrial arrhythmias Pt failed tikosyn and avoided amio with bradycardia.  Had unsuccessful Maze 05/2018  Had flutter/fib ablation 04/2019 -> slow AF -> atypical AFL into the 100s. He is now s/p AV nodal ablation and CRT with normalization for EF.  Continue Xarelto for CHA2DS2VASC of at least 3.     3. OSA Continue CPAP  4. Surgical Clearance Pt is at least low (more likely moderate) risk for perioperative complications from a cardiac perspective by the revised cardiac risk index (lee criteria).  He is cleared to proceed with hip surgery without further work up from a cardiac perspective.  Will forward request to pharmacy to comment on Xarelto timing.    Current medicines are reviewed at length with the patient  today.   The patient does not have concerns regarding his medicines.  The following changes were made today:  none  Labs/ tests ordered today include:  No orders of the defined types were placed in this encounter.   Disposition:   Follow up with Dr. Rayann Heman or EP APP  6 months. Sooner with symptoms.    Jacalyn Lefevre, PA-C  04/27/2020 2:11 PM  [address] [place] [address] [phone] (office) [phone] (fax) d

## 2020-04-27 NOTE — Telephone Encounter (Signed)
Called patient and informed him to hold his Xarelto for 3 days prior to suregery and to restart as soon as possible afterwards at the discretion of the surgeon. Patient verbalized understanding, thanked me for calling and asked if we were going to fax the information to [employer]. Confirmed that it will be.

## 2020-04-27 NOTE — Telephone Encounter (Signed)
Patient with diagnosis of afib on Xarelto for anticoagulation.    Procedure: left THA Date of procedure: TBD/ASAP :[id number]}  CHA2DS2-VASc Score = 3  This indicates a 3.2% annual risk of stroke. The patient's score is based upon: CHF History: 1 HTN History: 1 Diabetes History: 0 Stroke History: 0 Vascular Disease History: 0 Age Score: 1 Gender Score: 0  CrCl 46mL/min Platelet count 123K  Per office protocol, patient can hold Xarelto for 3 days prior to procedure.

## 2020-04-27 NOTE — Telephone Encounter (Signed)
Please inform the patient to hold Xarelto for 3 days prior to the procedure and restart as soon as possible after the surgery at the discretion of the surgeon.

## 2020-04-27 NOTE — Telephone Encounter (Signed)
   [facility] Pre-operative Risk Assessment    HEARTCARE STAFF: - Please ensure there is not already an duplicate clearance open for this procedure. - Under Visit Info/Reason for Call, type in Other and utilize the format Clearance MM/DD/YY or Clearance TBD. Do not use dashes or single digits. - If request is for dental extraction, please clarify the # of teeth to be extracted.  Request for surgical clearance:  1. What type of surgery is being performed? LEFT TOTAL HIP REPLACEMENT   2. When is this surgery scheduled? TBD/ASAP  3. What type of clearance is required (medical clearance vs. Pharmacy clearance to hold med vs. Both)? BOTH  4. Are there any medications that need to be held prior to surgery and how long? [place]   5. Practice name and name of physician performing surgery? [facility]; DR. Christia Reading [facility]   6. What is the office phone number? [phone]   7.   What is the office fax number? [place].   Anesthesia type (None, local, MAC, general) ? CHOICE   Julaine Hua 04/27/2020, 10:12 AM  _________________________________________________________________   (provider comments below)

## 2020-04-27 NOTE — Telephone Encounter (Signed)
Clinical pharmacist to address Xarelto. Patient was seen by Oda Kilts PA for preop clearance today. Will defer to [facility] for final clearance and remove from preop pool.

## 2020-05-01 DIAGNOSIS — Z7901 Long term (current) use of anticoagulants: Secondary | ICD-10-CM | POA: Diagnosis not present

## 2020-05-01 DIAGNOSIS — I10 Essential (primary) hypertension: Secondary | ICD-10-CM | POA: Diagnosis not present

## 2020-05-01 DIAGNOSIS — M1611 Unilateral primary osteoarthritis, right hip: Secondary | ICD-10-CM | POA: Diagnosis not present

## 2020-05-01 DIAGNOSIS — I480 Paroxysmal atrial fibrillation: Secondary | ICD-10-CM | POA: Diagnosis not present

## 2020-05-01 DIAGNOSIS — Z952 Presence of prosthetic heart valve: Secondary | ICD-10-CM | POA: Diagnosis not present

## 2020-05-01 DIAGNOSIS — I5022 Chronic systolic (congestive) heart failure: Secondary | ICD-10-CM | POA: Diagnosis not present

## 2020-05-16 DIAGNOSIS — R69 Illness, unspecified: Secondary | ICD-10-CM | POA: Diagnosis not present

## 2020-05-16 DIAGNOSIS — M25551 Pain in right hip: Secondary | ICD-10-CM | POA: Diagnosis not present

## 2020-05-18 ENCOUNTER — Other Ambulatory Visit: Payer: Self-pay

## 2020-05-18 ENCOUNTER — Telehealth (HOSPITAL_COMMUNITY): Payer: Self-pay | Admitting: Pharmacist

## 2020-05-18 ENCOUNTER — Encounter (HOSPITAL_COMMUNITY)
Admission: RE | Admit: 2020-05-18 | Discharge: 2020-05-18 | Disposition: A | Discharge status: Home / Self Care | Payer: Medicare HMO | Source: Ambulatory Visit | Attending: Orthopedic Surgery | Admitting: Orthopedic Surgery

## 2020-05-18 ENCOUNTER — Encounter (HOSPITAL_COMMUNITY): Payer: Self-pay

## 2020-05-18 DIAGNOSIS — Z01812 Encounter for preprocedural laboratory examination: Secondary | ICD-10-CM | POA: Insufficient documentation

## 2020-05-18 HISTORY — DX: Presence of automatic (implantable) cardiac defibrillator: Z95.810

## 2020-05-18 LAB — CBC
HCT: 41.2 % (ref 39.0–52.0)
Hemoglobin: 13.7 g/dL (ref 13.0–17.0)
MCH: 29.1 pg (ref 26.0–34.0)
MCHC: 33.3 g/dL (ref 30.0–36.0)
MCV: 87.7 fL (ref 80.0–100.0)
Platelets: 162 10*3/uL (ref 150–400)
RBC: 4.7 MIL/uL (ref 4.22–5.81)
RDW: 12.4 % (ref 11.5–15.5)
WBC: 7.9 10*3/uL (ref 4.0–10.5)
nRBC: 0 % (ref 0.0–0.2)

## 2020-05-18 LAB — BASIC METABOLIC PANEL
Anion gap: 11 (ref 5–15)
BUN: 45 mg/dL — ABNORMAL HIGH (ref 8–23)
CO2: 25 mmol/L (ref 22–32)
Calcium: 9.4 mg/dL (ref 8.9–10.3)
Chloride: 101 mmol/L (ref 98–111)
Creatinine, Ser: 1.46 mg/dL — ABNORMAL HIGH (ref 0.61–1.24)
GFR, Estimated: 52 mL/min — ABNORMAL LOW (ref >60)
Glucose, Bld: 99 mg/dL (ref 70–99)
Potassium: 4.6 mmol/L (ref 3.5–5.1)
Sodium: 137 mmol/L (ref 135–145)

## 2020-05-18 LAB — SURGICAL PCR SCREEN
MRSA, PCR: NEGATIVE
Staphylococcus aureus: NEGATIVE

## 2020-05-18 MED ORDER — SPIRONOLACTONE 25 MG PO TABS
25.0000 mg | ORAL_TABLET | Freq: Every day | ORAL | 3 refills | Status: DC
Start: 2020-05-18 — End: 2021-05-20

## 2020-05-18 NOTE — Telephone Encounter (Signed)
Spironolactone prescription sent to [facility] per patient request.

## 2020-05-18 NOTE — Progress Notes (Addendum)
Anesthesia Review:  PCP: Cardiologist : DR Thompson Grayer  LOV- 04/27/2020-Michael Tillery,PAC- clearance in note  Device check 04/27/2020 next remote device check- 05/22/2020  Device orders sent to office  Chest x-ray : 08/23/2019  EKG :03/23/2020  Echo : 04/20/2020  Stress test: Cardiac Cath :  Activity level: can do a fligh tof stairs without difficulty  Sleep Study/ CPAP : cpap- yes  Fasting Blood Sugar :      / Checks Blood Sugar -- times a day:   Blood Thinner/ Instructions /Last Dose: ASA / Instructions/ Last Dose :  BMP done 05/18/2020 routed via epic to Dr Alain Marion.

## 2020-05-18 NOTE — Progress Notes (Signed)
Anesthesia Chart Review   Case: [id number] Date/Time: 05/29/20 0715   Procedure: TOTAL HIP ARTHROPLASTY ANTERIOR APPROACH (Right Hip)   Anesthesia type: Choice   Pre-op diagnosis: OA RIGHT HIP   [facility] 08 / [facility]   Surgeons: Renette Butters, MD      DISCUSSION:69 y.o. never smoker with h/o PONV, OSA on CPAP, S/p AV repair and MAZE procedure 10/16/4096, chronic systolic dysfunction s/p AICD (device check 05/22/2020), right hip OA scheduled for above procedure 05/29/2020 with Dr. Edmonia Lynch.   Pt advised to hold Xarelto 3 days prior to surgery.    Pt last seen by cardiology 04/27/2020.  Per OV note, "Pt is at least low (more likely moderate) risk for perioperative complications from a cardiac perspective by the revised cardiac risk index (lee criteria).  He is cleared to proceed with hip surgery without further work up from a cardiac perspective."  Anticipate pt can proceed with planned procedure barring acute status change.   VS: BP 134/68   Pulse 64   Temp 36.4 C (Oral)   Resp 20   Ht 5\' 10"  (1.778 m)   Wt 119.7 kg   SpO2 97%   BMI 37.88 kg/m   PROVIDERS: Seward Carol, MD is PCP   Quay Burow, MD is Cardiology  LABS: Labs reviewed: Acceptable for surgery. (all labs ordered are listed, but only abnormal results are displayed)  Labs Reviewed  BASIC METABOLIC PANEL - Abnormal; Notable for the following components:      Result Value   BUN 45 (*)    Creatinine, Ser 1.46 (*)    GFR, Estimated 52 (*)    All other components within normal limits  SURGICAL PCR SCREEN  CBC  BASIC METABOLIC PANEL  CBC     IMAGES:   EKG: 03/23/20 Rate 60 bpm  Ventricular paced rhythm  Biventricular pacemaker detected  CV: Echo 04/20/2020 IMPRESSIONS    1. Left ventricular ejection fraction, by estimation, is 60 to 65%. The  left ventricle has normal function. The left ventricle has no regional  wall motion abnormalities. The left ventricular internal cavity  size was  mildly dilated. There is mild left  ventricular hypertrophy. Left ventricular diastolic parameters are  indeterminate.  2. Right ventricular systolic function is normal. The right ventricular  size is moderately enlarged. There is normal pulmonary artery systolic  pressure. The estimated right ventricular systolic pressure is 11.9 mmHg.  3. Left atrial size was severely dilated.  4. Right atrial size was mildly dilated.  5. The mitral valve has been repaired/replaced. There is a prosthetic  annuloplasty ring present in the mitral position. No evidence of mitral  valve regurgitation. Mean gradient 3 mmHg at 60bpm.  6. The aortic valve has been repaired/replaced. There is a prosthetic  annuloplasty ring present in the aortic position. Aortic valve  regurgitation is trivial. Aortic valve mean gradient measures 9.0 mmHg.  7. The inferior vena cava is normal in size with greater than 50%  respiratory variability, suggesting right atrial pressure of 3 mmHg.  8. Aortic dilatation noted. Aneurysm of the ascending aorta, measuring 46  mm.   Comparison(s): LV systolic function is markedly improved compared to prior  echo on 04/01/19. Past Medical History:  Diagnosis Date  . AICD (automatic cardioverter/defibrillator) present   . Aortic insufficiency 04/21/2018  . Aortic insufficiency   . Arthritis    "joints" (09/26/2013)  . Atrial enlargement, left   . Hearing aid worn    both ears  .  Mitral regurgitation 04/21/2018  . Obesity   . OSA on CPAP    "mask adjusts to what I need" (09/26/2013)  . Persistent atrial fibrillation ([place])    cardioversion 06/06/13  . PONV (postoperative nausea and vomiting)   . Rotator cuff tear, right dec 2012   physical therapy done, decreased strength  . S/P aortic valve repair 05/18/2018   Complex valvuloplasty including plication of right coronary leaflet and 21 mm Biostable HAART annuloplasty ring  . S/P Maze operation for atrial fibrillation  05/18/2018   Complete bilateral atrial lesion set using bipolar radiofrequency and cryothermy ablation with clipping of LA appendage  . S/P mitral valve repair 05/18/2018   Complex valvuloplasty including artificial Gore-tex neochord placement x4 and 30 mm Sorin Memo 4D ring annuloplasty  . Wears glasses     Past Surgical History:  Procedure Laterality Date  . AORTIC VALVE REPAIR N/A 05/18/2018   Procedure: AORTIC VALVE REPAIR using HAART 300 Aortic Annuloplasty Device size 3mm;  Surgeon: Rexene Alberts, MD;  Location: [place];  Service: Open Heart Surgery;  Laterality: N/A;  . ATRIAL FIBRILLATION ABLATION N/A 04/26/2019   Procedure: ATRIAL FIBRILLATION ABLATION;  Surgeon: Thompson Grayer, MD;  Location: [place] [facility];  Service: Cardiovascular;  Laterality: N/A;  . AV NODE ABLATION N/A 12/20/2019   Procedure: AV NODE ABLATION;  Surgeon: Thompson Grayer, MD;  Location: [place] [facility];  Service: Cardiovascular;  Laterality: N/A;  . BIV ICD INSERTION CRT-D N/A 08/23/2019   Procedure: BIV ICD INSERTION CRT-D;  Surgeon: Thompson Grayer, MD;  Location: [place] [facility];  Service: Cardiovascular;  Laterality: N/A;  . CARDIOVERSION N/A 06/06/2013   Procedure: CARDIOVERSION;  Surgeon: Sanda Klein, MD;  Location: [place] [facility]OSCOPY;  Service: Cardiovascular;  Laterality: N/A;  . CARDIOVERSION N/A 09/28/2013   Procedure: CARDIOVERSION BEDSIDE;  Surgeon: Peter M Martinique, MD;  Location: [place];  Service: Cardiovascular;  Laterality: N/A;  . CARDIOVERSION N/A 11/30/2018   Procedure: CARDIOVERSION;  Surgeon: Dorothy Spark, MD;  Location: [facility] ENDOSCOPY;  Service: Cardiovascular;  Laterality: N/A;  . CARDIOVERSION N/A 03/10/2019   Procedure: CARDIOVERSION;  Surgeon: [name], MD;  Location: [place];  Service: Endoscopy;  Laterality: N/A;  . CARPAL TUNNEL RELEASE Right 08/08/2013   Procedure: RIGHT CARPAL TUNNEL RELEASE;  Surgeon: Wynonia Sours, MD;  Location: [place];   Service: Orthopedics;  Laterality: Right;  . CARPAL TUNNEL RELEASE Left 2008  . CLIPPING OF ATRIAL APPENDAGE  05/18/2018   Procedure: CLIPPING OF ATRIAL APPENDAGE using AtriCure Clip size 45;  Surgeon: Rexene Alberts, MD;  Location: [place];  Service: Open Heart Surgery;;  . KNEE ARTHROSCOPY Right 1990's  . MAZE N/A 05/18/2018   Procedure: MAZE;  Surgeon: Rexene Alberts, MD;  Location: [place];  Service: Open Heart Surgery;  Laterality: N/A;  . MITRAL VALVE REPAIR N/A 05/18/2018   Procedure: MITRAL VALVE REPAIR (MVR) using 4D Memo Ring size 30;  Surgeon: Rexene Alberts, MD;  Location: [place];  Service: Open Heart Surgery;  Laterality: N/A;  . RIGHT/LEFT HEART CATH AND CORONARY ANGIOGRAPHY N/A 04/21/2018   Procedure: RIGHT/LEFT HEART CATH AND CORONARY ANGIOGRAPHY;  Surgeon: Nelva Bush, MD;  Location: [place] [facility];  Service: Cardiovascular;  Laterality: N/A;  . SHOULDER OPEN ROTATOR CUFF REPAIR Left 1990's  . SHOULDER OPEN ROTATOR CUFF REPAIR Right 11/2007  . TEE WITHOUT CARDIOVERSION N/A 04/21/2018   Procedure: TRANSESOPHAGEAL ECHOCARDIOGRAM (TEE);  Surgeon: Jerline Pain, MD;  [facility];  Service: Cardiovascular;  Laterality: N/A;  . TEE WITHOUT CARDIOVERSION N/A 05/18/2018   Procedure: TRANSESOPHAGEAL ECHOCARDIOGRAM (TEE);  Surgeon: Rexene Alberts, MD;  Location: [place];  Service: Open Heart Surgery;  Laterality: N/A;  . TONSILLECTOMY  1950's  . TOTAL HIP ARTHROPLASTY Left 2010  . TOTAL HIP REVISION Left 10/08/2011  . TOTAL HIP REVISION  04/09/2012   Procedure: TOTAL HIP REVISION;  Surgeon: Gearlean Alf, MD;  [facility];  Service: Orthopedics;  Laterality: Left;  Left Hip Acetabular Revision vs Constrained Liner  . TOTAL KNEE ARTHROPLASTY Right 2006   . TRIGGER FINGER RELEASE Right 08/08/2013   Procedure: RELEASE STENOSING TENOSYNOVITIS RIGHT THUMB;  Surgeon: Wynonia Sours, MD;  Location: [place];  Service: Orthopedics;  Laterality: Right;     MEDICATIONS: . acetaminophen (TYLENOL) 325 MG tablet  . Ascorbic Acid (VITAMIN C) 1000 MG tablet  . carvedilol (COREG) 6.25 MG tablet  . clobetasol ointment (TEMOVATE) 0.05 %  . diclofenac Sodium (VOLTAREN) 1 % GEL  . HYDROcodone-acetaminophen (NORCO/VICODIN) 5-325 MG tablet  . ivermectin (STROMECTOL) 3 MG TABS tablet  . KLOR-CON M20 20 MEQ tablet  . MAGNESIUM PO  . Multiple Vitamin (MULTIVITAMIN WITH MINERALS) TABS tablet  . Naproxen Sod-diphenhydrAMINE (ALEVE PM) 220-25 MG TABS  . Omega-3 Fatty Acids (FISH OIL PO)  . OVER THE COUNTER MEDICATION  . Probiotic Product (PROBIOTIC PO)  . sacubitril-valsartan (ENTRESTO) 49-51 MG  . spironolactone (ALDACTONE) 25 MG tablet  . torsemide (DEMADEX) 20 MG tablet  . XARELTO 20 MG TABS tablet  . zinc gluconate 50 MG tablet   No current facility-administered medications for this encounter.   Lance Felix, PA-C [facility] [phone]

## 2020-05-18 NOTE — H&P (Signed)
HIP ARTHROPLASTY ADMISSION H&P  Patient ID: [name] MRN: [id number] DOB/AGE: 11/11/1950 69 y.o.  Chief Complaint: right hip pain.  Planned Procedure Date: 05/29/20 Medical Clearance by Dr. Delfina Redwood   Cardiac Clearance by Dr. Rayann Heman    HPI: [name] is a 69 y.o. male who presents for evaluation of OA RIGHT HIP. The patient has a history of pain and functional disability in the right hip due to arthritis and has failed non-surgical conservative treatments for greater than 12 weeks to include NSAID's and/or analgesics, corticosteriod injections, weight reduction as appropriate and activity modification.  Onset of symptoms was abrupt, starting 1 years ago with rapidlly worsening course since that time. The patient noted no past surgery on the right hip.  Patient currently rates pain at 9 out of 10 with activity. Patient has night pain, worsening of pain with activity and weight bearing, pain that interferes with activities of daily living and pain with passive range of motion.  Patient has evidence of periarticular osteophytes and joint space narrowing by imaging studies.  There is no active infection.  Past Medical History:  Diagnosis Date  . Aortic insufficiency 04/21/2018  . Aortic insufficiency   . Arthritis    "joints" (09/26/2013)  . Atrial enlargement, left   . Dyspnea    with exertion  . Hearing aid worn    both ears  . Hypertension   . Mitral regurgitation 04/21/2018  . Obesity   . OSA on CPAP    "mask adjusts to what I need" (09/26/2013)  . Persistent atrial fibrillation ([place])    cardioversion 06/06/13  . PONV (postoperative nausea and vomiting)   . Rotator cuff tear, right dec 2012   physical therapy done, decreased strength  . S/P aortic valve repair 05/18/2018   Complex valvuloplasty including plication of right coronary leaflet and 21 mm Biostable HAART annuloplasty ring  . S/P Maze operation for atrial fibrillation 05/18/2018   Complete bilateral atrial  lesion set using bipolar radiofrequency and cryothermy ablation with clipping of LA appendage  . S/P mitral valve repair 05/18/2018   Complex valvuloplasty including artificial Gore-tex neochord placement x4 and 30 mm Sorin Memo 4D ring annuloplasty  . Wears glasses    Past Surgical History:  Procedure Laterality Date  . AORTIC VALVE REPAIR N/A 05/18/2018   Procedure: AORTIC VALVE REPAIR using HAART 300 Aortic Annuloplasty Device size 81mm;  Surgeon: Rexene Alberts, MD;  Location: [place];  Service: Open Heart Surgery;  Laterality: N/A;  . ATRIAL FIBRILLATION ABLATION N/A 04/26/2019   Procedure: ATRIAL FIBRILLATION ABLATION;  Surgeon: Thompson Grayer, MD;  Location: [place] [facility];  Service: Cardiovascular;  Laterality: N/A;  . AV NODE ABLATION N/A 12/20/2019   Procedure: AV NODE ABLATION;  Surgeon: Thompson Grayer, MD;  Location: [place] [facility];  Service: Cardiovascular;  Laterality: N/A;  . BIV ICD INSERTION CRT-D N/A 08/23/2019   Procedure: BIV ICD INSERTION CRT-D;  Surgeon: Thompson Grayer, MD;  Location: [place] [facility];  Service: Cardiovascular;  Laterality: N/A;  . CARDIOVERSION N/A 06/06/2013   Procedure: CARDIOVERSION;  Surgeon: Sanda Klein, MD;  Location: [place] [facility];  Service: Cardiovascular;  Laterality: N/A;  . CARDIOVERSION N/A 09/28/2013   Procedure: CARDIOVERSION BEDSIDE;  Surgeon: Peter M Martinique, MD;  Location: [place];  Service: Cardiovascular;  Laterality: N/A;  . CARDIOVERSION N/A 11/30/2018   Procedure: CARDIOVERSION;  Surgeon: Dorothy Spark, MD;  Location: [facility] ENDOSCOPY;  Service: Cardiovascular;  Laterality: N/A;  . CARDIOVERSION N/A 03/10/2019  Procedure: CARDIOVERSION;  Surgeon: [name], MD;  Location: [facility] ENDOSCOPY;  Service: Endoscopy;  Laterality: N/A;  . CARPAL TUNNEL RELEASE Right 08/08/2013   Procedure: RIGHT CARPAL TUNNEL RELEASE;  Surgeon: Wynonia Sours, MD;  Location: [place];  Service: Orthopedics;  Laterality: Right;  .  CARPAL TUNNEL RELEASE Left 2008  . CLIPPING OF ATRIAL APPENDAGE  05/18/2018   Procedure: CLIPPING OF ATRIAL APPENDAGE using AtriCure Clip size 45;  Surgeon: Rexene Alberts, MD;  Location: [place];  Service: Open Heart Surgery;;  . KNEE ARTHROSCOPY Right 1990's  . MAZE N/A 05/18/2018   Procedure: MAZE;  Surgeon: Rexene Alberts, MD;  Location: [place];  Service: Open Heart Surgery;  Laterality: N/A;  . MITRAL VALVE REPAIR N/A 05/18/2018   Procedure: MITRAL VALVE REPAIR (MVR) using 4D Memo Ring size 30;  Surgeon: Rexene Alberts, MD;  Location: [place];  Service: Open Heart Surgery;  Laterality: N/A;  . RIGHT/LEFT HEART CATH AND CORONARY ANGIOGRAPHY N/A 04/21/2018   Procedure: RIGHT/LEFT HEART CATH AND CORONARY ANGIOGRAPHY;  Surgeon: Nelva Bush, MD;  Location: [place] CV LAB;  Service: Cardiovascular;  Laterality: N/A;  . SHOULDER OPEN ROTATOR CUFF REPAIR Left 1990's  . SHOULDER OPEN ROTATOR CUFF REPAIR Right 11/2007  . TEE WITHOUT CARDIOVERSION N/A 04/21/2018   Procedure: TRANSESOPHAGEAL ECHOCARDIOGRAM (TEE);  Surgeon: Jerline Pain, MD;  Location: [facility] ENDOSCOPY;  Service: Cardiovascular;  Laterality: N/A;  . TEE WITHOUT CARDIOVERSION N/A 05/18/2018   Procedure: TRANSESOPHAGEAL ECHOCARDIOGRAM (TEE);  Surgeon: Rexene Alberts, MD;  Location: [place];  Service: Open Heart Surgery;  Laterality: N/A;  . TONSILLECTOMY  1950's  . TOTAL HIP ARTHROPLASTY Left 2010  . TOTAL HIP REVISION Left 10/08/2011  . TOTAL HIP REVISION  04/09/2012   Procedure: TOTAL HIP REVISION;  Surgeon: Gearlean Alf, MD;  Location: [facility];  Service: Orthopedics;  Laterality: Left;  Left Hip Acetabular Revision vs Constrained Liner  . TOTAL KNEE ARTHROPLASTY Right 2006   . TRIGGER FINGER RELEASE Right 08/08/2013   Procedure: RELEASE STENOSING TENOSYNOVITIS RIGHT THUMB;  Surgeon: Wynonia Sours, MD;  Location: [place];  Service: Orthopedics;  Laterality: Right;   Allergies  Allergen Reactions  . Metoprolol  Tartrate Shortness Of Breath    Shortness of breath, pressure in chest and back  . Oxycodone Rash and Other (See Comments)    Can tolerate Hydrocodone   Prior to Admission medications   Medication Sig Start Date End Date Taking? Authorizing Provider  Ascorbic Acid (VITAMIN C) 1000 MG tablet Take 1,000 mg by mouth daily.   Yes [provider]  carvedilol (COREG) 6.25 MG tablet Take 1 tablet (6.25 mg total) by mouth 2 (two) times daily. 09/29/19  Yes Shirley Friar, PA-C  diclofenac Sodium (VOLTAREN) 1 % GEL Apply 2 g topically daily as needed (pain).  12/14/19  Yes [provider]  KLOR-CON M20 20 MEQ tablet TAKE 3 TABLETS BY MOUTH EVERY DAY Patient taking differently: Take 20 mEq by mouth 2 (two) times daily.  04/10/20  Yes Larey Dresser, MD  MAGNESIUM PO Take 1 tablet by mouth daily.   Yes [provider]  Multiple Vitamin (MULTIVITAMIN WITH MINERALS) TABS tablet Take 1 tablet by mouth daily.    Yes [provider]  Omega-3 Fatty Acids (FISH OIL PO) Take 1 capsule by mouth 4 (four) times a week.    Yes [provider]  sacubitril-valsartan (ENTRESTO) 49-51 MG Take 1 tablet by  mouth 2 (two) times daily. 03/23/20  Yes Larey Dresser, MD  spironolactone (ALDACTONE) 25 MG tablet TAKE 1/2 TABLET BY MOUTH EVERY DAY Patient taking differently: Take 25 mg by mouth daily.  04/02/20  Yes Larey Dresser, MD  torsemide (DEMADEX) 20 MG tablet Take 3 tablets (60 mg total) by mouth daily. 04/23/20  Yes McLean, Elby Showers, MD  XARELTO 20 MG TABS tablet TAKE ONE TABLET BY MOUTH DAILY WITH SUPPER Patient taking differently: Take 20 mg by mouth daily with supper.  10/19/19  Yes Allred, Jeneen Rinks, MD  zinc gluconate 50 MG tablet Take 50 mg by mouth daily.   Yes [provider]  acetaminophen (TYLENOL) 325 MG tablet Take 2 tablets (650 mg total) by mouth every 4 (four) hours as needed for headache or mild pain. Patient not taking: Reported on 05/15/2020  12/21/19   Shirley Friar, PA-C  clobetasol ointment (TEMOVATE) 7.34 % Apply 1 application topically daily as needed (rash). Patient not taking: Reported on 05/15/2020    [provider]  HYDROcodone-acetaminophen (NORCO/VICODIN) 5-325 MG tablet Take 1 tablet by mouth every 12 (twelve) hours as needed for moderate pain. Patient not taking: Reported on 05/15/2020 08/23/19   Shirley Friar, PA-C  ivermectin (STROMECTOL) 3 MG TABS tablet Take by mouth.  Patient not taking: Reported on 05/15/2020 03/15/20   [provider]  Naproxen Sod-diphenhydrAMINE (ALEVE PM) 220-25 MG TABS Take 2 tablets by mouth at bedtime as needed (sleep/pain). Patient not taking: Reported on 05/15/2020    [provider]  OVER THE COUNTER MEDICATION Apply 1 spray topically daily as needed (cramps). Topical Magnesium Spray Patient not taking: Reported on 05/15/2020    [provider]  Probiotic Product (PROBIOTIC PO) Take 1 capsule by mouth daily. Patient not taking: Reported on 05/15/2020    [provider]   Social History   Socioeconomic History  . Marital status: Married    Spouse name: Not on file  . Number of children: Not on file  . Years of education: Not on file  . Highest education level: Not on file  Occupational History  . Occupation: Agricultural consultant  Tobacco Use  . Smoking status: Never Smoker  . Smokeless tobacco: Never Used  Vaping Use  . Vaping Use: Never used  Substance and Sexual Activity  . Alcohol use: No  . Drug use: No  . Sexual activity: Yes  Other Topics Concern  . Not on file  Social History Narrative   Pt lives in [place] with spouse.  Leadership training and behavioral safety training.   Social Determinants of Health   Financial Resource Strain:   . Difficulty of Paying Living Expenses: Not on file  Food Insecurity:   . Worried About Charity fundraiser in the Last Year: Not on file  . Ran Out of  Food in the Last Year: Not on file  Transportation Needs: No Transportation Needs  . Lack of Transportation (Medical): No  . Lack of Transportation (Non-Medical): No  Physical Activity:   . Days of Exercise per Week: Not on file  . Minutes of Exercise per Session: Not on file  Stress:   . Feeling of Stress : Not on file  Social Connections:   . Frequency of Communication with Friends and Family: Not on file  . Frequency of Social Gatherings with Friends and Family: Not on file  . Attends Religious Services: Not on file  . Active Member of Clubs or Organizations:  Not on file  . Attends Archivist Meetings: Not on file  . Marital Status: Not on file   Family History  Problem Relation Age of Onset  . Other Other        mother had a pacemaker  . Hypertension Father     ROS: Currently denies lightheadedness, dizziness, Fever, chills, CP, SOB.   No personal history of DVT, PE, MI, or CVA. No loose teeth or dentures All other systems have been reviewed and were otherwise currently negative with the exception of those mentioned in the HPI and as above.  Objective: Vitals: Ht: 70" Wt: 265.4 lbs Temp: 98 BP: 117/66 Pulse: 60 O2 96% on room air.   Physical Exam: General: Alert, NAD.  HEENT: EOMI, Trachea Midline; Head AT/[place] Pulm: No increased work of breathing.  Clear B/L A/P w/o crackle or wheeze.  CV: RRR, No m/g/r appreciated  GI: soft, NT, ND Neuro: Neuro without gross focal deficit.  Sensation intact distally Skin: No lesions in the area of chief complaint MSK/Surgical Site: Right Hip pain with passive ROM.  Positive Stinchfield.  5/5 strength.  NVI.    Imaging Review Plain radiographs demonstrate severe degenerative joint disease of the right hip.   The bone quality appears to be good for age and reported activity level.  Preoperative templating of the joint replacement has been completed, documented, and submitted to the Operating Room personnel in order to  optimize intra-operative equipment management.  Assessment: OA RIGHT HIP Active Problems:   * No active hospital problems. *   Plan: Plan for Procedure(s): TOTAL HIP ARTHROPLASTY ANTERIOR APPROACH  The patient history, physical exam, clinical judgement of the provider and imaging are consistent with end stage degenerative joint disease and total joint arthroplasty is deemed medically necessary. The treatment options including medical management, injection therapy, and arthroplasty were discussed at length. The risks and benefits of Procedure(s): TOTAL HIP ARTHROPLASTY ANTERIOR APPROACH were presented and reviewed.  The risks of nonoperative treatment, versus surgical intervention including but not limited to continued pain, aseptic loosening, stiffness, dislocation/subluxation, infection, bleeding, nerve injury, blood clots, cardiopulmonary complications, morbidity, mortality, among others were discussed. The patient verbalizes understanding and wishes to proceed with the plan.  Patient is being admitted for surgery, pain control, PT, prophylactic antibiotics, VTE prophylaxis, progressive ambulation, ADL's and discharge planning.   Dental prophylaxis discussed and recommended for 2 years postoperatively.   The patient does meet the criteria for TXA which will be used perioperatively.    Pt's own Xarelto  will be used postoperatively for DVT prophylaxis in addition to SCDs, and early ambulation.  Plan for Norco, Celebrex, for pain.  Robaxin for spasm.  Omeprazole for gastric protection. Zofran for nausea  The patient is planning to be discharged home with OPPT in care of his wife.     Rachael Fee, PA-C 05/18/2020 7:39 AM

## 2020-05-18 NOTE — Progress Notes (Signed)
DUE TO COVID-19 ONLY ONE VISITOR IS ALLOWED TO COME WITH YOU AND STAY IN THE WAITING ROOM ONLY DURING PRE OP AND PROCEDURE DAY OF SURGERY. THE 1 VISITOR  MAY VISIT WITH YOU AFTER SURGERY IN YOUR PRIVATE ROOM DURING VISITING HOURS ONLY!  YOU NEED TO HAVE A COVID 19 TEST ON_11/06/2020 ______ @_______ , THIS TEST MUST BE DONE BEFORE SURGERY,  COVID TESTING SITE [address] [place] [address], IT IS ON THE RIGHT GOING OUT [address] APPROXIMATELY  2 MINUTES PAST [employer] ON THE RIGHT. ONCE YOUR COVID TEST IS COMPLETED,  PLEASE BEGIN THE QUARANTINE INSTRUCTIONS AS OUTLINED IN YOUR HANDOUT.                [name]  05/18/2020   Your procedure is scheduled on:  05/29/2020   Report to [facility] Main  Entrance   Report to admitting at   0530 AM     Call this number if you have problems the morning of surgery [phone]    REMEMBER: NO  SOLID FOOD CANDY OR GUM AFTER MIDNIGHT. CLEAR LIQUIDS [id number]         . NOTHING BY MOUTH EXCEPT CLEAR LIQUIDS UNTIL    . PLEASE FINISH ENSURE DRINK PER SURGEON ORDER  WHICH NEEDS TO BE COMPLETED AT  0430am    .      CLEAR LIQUID DIET   Foods Allowed                                                                    Coffee and tea, regular and decaf                            Fruit ices (not with fruit pulp)                                      Iced Popsicles                                    Carbonated beverages, regular and diet                                    Cranberry, grape and apple juices Sports drinks like Gatorade Lightly seasoned clear broth or consume(fat free) Sugar, honey syrup ___________________________________________________________________      BRUSH YOUR TEETH MORNING OF SURGERY AND RINSE YOUR MOUTH OUT, NO CHEWING GUM CANDY OR MINTS.     Take these medicines the morning of surgery with A SIP OF WATER:  Coreg  DO NOT TAKE ANY DIABETIC MEDICATIONS DAY OF YOUR SURGERY                                You may not have any metal on your body including hair pins and              piercings  Do not wear jewelry, make-up,  lotions, powders or perfumes, deodorant             Do not wear nail polish on your fingernails.  Do not shave  48 hours prior to surgery.              Men may shave face and neck.   Do not bring valuables to the hospital. [place].  Contacts, dentures or bridgework may not be worn into surgery.  Leave suitcase in the car. After surgery it may be brought to your room.     Patients discharged the day of surgery will not be allowed to drive home. IF YOU ARE HAVING SURGERY AND GOING HOME THE SAME DAY, YOU MUST HAVE AN ADULT TO DRIVE YOU HOME AND BE WITH YOU FOR 24 HOURS. YOU MAY GO HOME BY TAXI OR UBER OR ORTHERWISE, BUT AN ADULT MUST ACCOMPANY YOU HOME AND STAY WITH YOU FOR 24 HOURS.  Name and phone number of your driver:  Special Instructions: N/A              Please read over the following fact sheets you were given: _____________________________________________________________________  [facility] - Preparing for Surgery Before surgery, you can play an important role.  Because skin is not sterile, your skin needs to be as free of germs as possible.  You can reduce the number of germs on your skin by washing with CHG (chlorahexidine gluconate) soap before surgery.  CHG is an antiseptic cleaner which kills germs and bonds with the skin to continue killing germs even after washing. Please DO NOT use if you have an allergy to CHG or antibacterial soaps.  If your skin becomes reddened/irritated stop using the CHG and inform your nurse when you arrive at Short Stay. Do not shave (including legs and underarms) for at least 48 hours prior to the first CHG shower.  You may shave your face/neck. Please follow these instructions carefully:  1.  Shower with CHG Soap the night before surgery and the  morning of  Surgery.  2.  If you choose to wash your hair, wash your hair first as usual with your  normal  shampoo.  3.  After you shampoo, rinse your hair and body thoroughly to remove the  shampoo.                           4.  Use CHG as you would any other liquid soap.  You can apply chg directly  to the skin and wash                       Gently with a scrungie or clean washcloth.  5.  Apply the CHG Soap to your body ONLY FROM THE NECK DOWN.   Do not use on face/ open                           Wound or open sores. Avoid contact with eyes, ears mouth and genitals (private parts).                       Wash face,  Genitals (private parts) with your normal soap.             6.  Wash thoroughly, paying special attention to  the area where your surgery  will be performed.  7.  Thoroughly rinse your body with warm water from the neck down.  8.  DO NOT shower/wash with your normal soap after using and rinsing off  the CHG Soap.                9.  Pat yourself dry with a clean towel.            10.  Wear clean pajamas.            11.  Place clean sheets on your bed the night of your first shower and do not  sleep with pets. Day of Surgery : Do not apply any lotions/deodorants the morning of surgery.  Please wear clean clothes to the hospital/surgery center.  FAILURE TO FOLLOW THESE INSTRUCTIONS MAY RESULT IN THE CANCELLATION OF YOUR SURGERY PATIENT SIGNATURE_________________________________  NURSE SIGNATURE__________________________________  ________________________________________________________________________

## 2020-05-22 ENCOUNTER — Ambulatory Visit (INDEPENDENT_AMBULATORY_CARE_PROVIDER_SITE_OTHER): Payer: Medicare HMO

## 2020-05-22 DIAGNOSIS — I428 Other cardiomyopathies: Secondary | ICD-10-CM

## 2020-05-22 LAB — CUP PACEART REMOTE DEVICE CHECK
Battery Remaining Longevity: 74 mo
Battery Remaining Percentage: 85 %
Battery Voltage: 2.98 V
Date Time Interrogation Session: 20211109013125
HighPow Impedance: 80 Ohm
Implantable Lead Implant Date: 20210209
Implantable Lead Implant Date: 20210209
Implantable Lead Implant Date: 20210209
Implantable Lead Location: 753858
Implantable Lead Location: 753859
Implantable Lead Location: 753860
Implantable Pulse Generator Implant Date: 20210209
Lead Channel Impedance Value: 430 Ohm
Lead Channel Impedance Value: 500 Ohm
Lead Channel Impedance Value: 990 Ohm
Lead Channel Pacing Threshold Amplitude: 0.75 V
Lead Channel Pacing Threshold Amplitude: 2 V
Lead Channel Pacing Threshold Pulse Width: 0.5 ms
Lead Channel Pacing Threshold Pulse Width: 0.5 ms
Lead Channel Sensing Intrinsic Amplitude: 4.1 mV
Lead Channel Sensing Intrinsic Amplitude: 9.2 mV
Lead Channel Setting Pacing Amplitude: 2.5 V
Lead Channel Setting Pacing Amplitude: 2.75 V
Lead Channel Setting Pacing Pulse Width: 0.5 ms
Lead Channel Setting Pacing Pulse Width: 0.5 ms
Lead Channel Setting Sensing Sensitivity: 0.5 mV
Pulse Gen Serial Number: 111006442

## 2020-05-23 DIAGNOSIS — G4733 Obstructive sleep apnea (adult) (pediatric): Secondary | ICD-10-CM | POA: Diagnosis not present

## 2020-05-23 NOTE — Progress Notes (Signed)
Remote ICD transmission.   

## 2020-05-24 ENCOUNTER — Ambulatory Visit (INDEPENDENT_AMBULATORY_CARE_PROVIDER_SITE_OTHER): Payer: Medicare HMO

## 2020-05-24 DIAGNOSIS — Z95810 Presence of automatic (implantable) cardiac defibrillator: Secondary | ICD-10-CM | POA: Diagnosis not present

## 2020-05-24 DIAGNOSIS — I5022 Chronic systolic (congestive) heart failure: Secondary | ICD-10-CM

## 2020-05-25 ENCOUNTER — Other Ambulatory Visit (HOSPITAL_COMMUNITY)
Admission: RE | Admit: 2020-05-25 | Discharge: 2020-05-25 | Disposition: A | Discharge status: Home / Self Care | Payer: Medicare HMO | Source: Ambulatory Visit | Attending: Orthopedic Surgery | Admitting: Orthopedic Surgery

## 2020-05-25 DIAGNOSIS — Z01812 Encounter for preprocedural laboratory examination: Secondary | ICD-10-CM | POA: Diagnosis not present

## 2020-05-25 DIAGNOSIS — Z20822 Contact with and (suspected) exposure to covid-19: Secondary | ICD-10-CM | POA: Diagnosis not present

## 2020-05-25 LAB — SARS CORONAVIRUS 2 (TAT 6-24 HRS): SARS Coronavirus 2: NEGATIVE

## 2020-05-25 NOTE — Progress Notes (Signed)
EPIC Encounter for ICM Monitoring  Patient Name: [name] is a 69 y.o. male Date: 05/25/2020 Primary Care Physican: Seward Carol, MD Primary Cardiologist:McLean Electrophysiologist:Allred Bi-V Pacing:>99% 04/27/2020 Weight:273lbs   AT/AF Burden: 100% (taking Xarelto)  Transmission reviewed.    CorVue thoracic impedance normal fluid levels.     Prescribed:   Torsemide20 mg take 3 tablets (60 mg total) daily. He reports Dr Rayann Heman approved of him taking extra Torsemide when needed.  Klor Con 20 mEq take 1 tablet twice a day.   Spironolactone 25 mg take 1 tablet daily  Labs: 03/06/2020 Creatinine 1.42, BUN 42, Potassium 4.3, Sodium 140, GFR 51-59 12/15/2019 Creatinine1.38, BUN46, Potassium4.3, DGLOVF643, GFR53-61 09/27/2019 Creatinine1.32, BUN36, Potassium4.7, Sodium140, GFR56->60  08/09/2019 Creatinine1.39, BUN38, Potassium4.1, Sodium139, GFR52->60 A complete set of results can be found in Results Review.  Recommendations:No changes.   Follow-up plan: ICM clinic phone appointment on12/16/2021. 91 day device clinic remote transmission 08/22/2019.   EP/Cardiology Office Visits:06/28/2020 with Dr.McLean.  Copy of ICM check sent to Dr.Allred  3 month ICM trend: 05/25/2020    1 Year ICM trend:       Rosalene Billings, RN 05/25/2020 3:39 PM

## 2020-05-29 ENCOUNTER — Encounter (HOSPITAL_COMMUNITY): Payer: Self-pay | Admitting: Orthopedic Surgery

## 2020-05-29 ENCOUNTER — Ambulatory Visit (HOSPITAL_COMMUNITY): Payer: Medicare HMO

## 2020-05-29 ENCOUNTER — Encounter (HOSPITAL_COMMUNITY)
Admission: RE | Disposition: A | Discharge status: Home / Self Care | Payer: Self-pay | Source: Home / Self Care | Attending: Orthopedic Surgery

## 2020-05-29 ENCOUNTER — Ambulatory Visit (HOSPITAL_COMMUNITY): Payer: Medicare HMO | Admitting: Physician Assistant

## 2020-05-29 ENCOUNTER — Ambulatory Visit (HOSPITAL_COMMUNITY)
Admission: RE | Admit: 2020-05-29 | Discharge: 2020-05-29 | Disposition: A | Discharge status: Home / Self Care | Payer: Medicare HMO | Attending: Orthopedic Surgery | Admitting: Orthopedic Surgery

## 2020-05-29 ENCOUNTER — Ambulatory Visit (HOSPITAL_COMMUNITY): Payer: Medicare HMO | Admitting: Certified Registered Nurse Anesthetist

## 2020-05-29 DIAGNOSIS — Z79899 Other long term (current) drug therapy: Secondary | ICD-10-CM | POA: Diagnosis not present

## 2020-05-29 DIAGNOSIS — Z7901 Long term (current) use of anticoagulants: Secondary | ICD-10-CM | POA: Diagnosis not present

## 2020-05-29 DIAGNOSIS — I080 Rheumatic disorders of both mitral and aortic valves: Secondary | ICD-10-CM | POA: Diagnosis not present

## 2020-05-29 DIAGNOSIS — M1611 Unilateral primary osteoarthritis, right hip: Secondary | ICD-10-CM | POA: Insufficient documentation

## 2020-05-29 DIAGNOSIS — M1612 Unilateral primary osteoarthritis, left hip: Secondary | ICD-10-CM | POA: Diagnosis not present

## 2020-05-29 DIAGNOSIS — G4733 Obstructive sleep apnea (adult) (pediatric): Secondary | ICD-10-CM | POA: Diagnosis not present

## 2020-05-29 DIAGNOSIS — I4819 Other persistent atrial fibrillation: Secondary | ICD-10-CM | POA: Diagnosis not present

## 2020-05-29 DIAGNOSIS — Z419 Encounter for procedure for purposes other than remedying health state, unspecified: Secondary | ICD-10-CM

## 2020-05-29 DIAGNOSIS — Z0389 Encounter for observation for other suspected diseases and conditions ruled out: Secondary | ICD-10-CM | POA: Diagnosis not present

## 2020-05-29 HISTORY — PX: TOTAL HIP ARTHROPLASTY: SHX124

## 2020-05-29 SURGERY — ARTHROPLASTY, HIP, TOTAL, ANTERIOR APPROACH
Anesthesia: Monitor Anesthesia Care | Site: Hip | Laterality: Right

## 2020-05-29 MED ORDER — CEFAZOLIN SODIUM-DEXTROSE 2-4 GM/100ML-% IV SOLN
2.0000 g | Freq: Four times a day (QID) | INTRAVENOUS | Status: DC
  Administered 2020-05-29: 2 g via INTRAVENOUS

## 2020-05-29 MED ORDER — BUPIVACAINE HCL (PF) 0.75 % IJ SOLN
INTRAMUSCULAR | Status: DC | PRN
  Administered 2020-05-29: 2 mL via INTRATHECAL

## 2020-05-29 MED ORDER — DEXAMETHASONE SODIUM PHOSPHATE 10 MG/ML IJ SOLN
INTRAMUSCULAR | Status: DC | PRN
  Administered 2020-05-29: 5 mg via INTRAVENOUS

## 2020-05-29 MED ORDER — ONDANSETRON HCL 4 MG/2ML IJ SOLN: 4.0000 mg | Freq: Four times a day (QID) | INTRAMUSCULAR | Status: DC | PRN

## 2020-05-29 MED ORDER — DEXAMETHASONE SODIUM PHOSPHATE 10 MG/ML IJ SOLN
INTRAMUSCULAR | Status: AC
  Filled 2020-05-29: qty 1

## 2020-05-29 MED ORDER — OXYCODONE HCL 5 MG/5ML PO SOLN: 5.0000 mg | Freq: Once | ORAL | Status: DC | PRN

## 2020-05-29 MED ORDER — 0.9 % SODIUM CHLORIDE (POUR BTL) OPTIME
TOPICAL | Status: DC | PRN
  Administered 2020-05-29: 1000 mL

## 2020-05-29 MED ORDER — PHENYLEPHRINE HCL-NACL 10-0.9 MG/250ML-% IV SOLN
INTRAVENOUS | Status: DC | PRN
  Administered 2020-05-29: 40 ug/min via INTRAVENOUS

## 2020-05-29 MED ORDER — PROPOFOL 10 MG/ML IV BOLUS
INTRAVENOUS | Status: DC | PRN
  Administered 2020-05-29: 20 mg via INTRAVENOUS

## 2020-05-29 MED ORDER — WATER FOR IRRIGATION, STERILE IR SOLN
Status: DC | PRN
  Administered 2020-05-29: 2000 mL

## 2020-05-29 MED ORDER — FENTANYL CITRATE (PF) 100 MCG/2ML IJ SOLN
25.0000 ug | INTRAMUSCULAR | Status: DC | PRN
  Administered 2020-05-29 (×2): 50 ug via INTRAVENOUS

## 2020-05-29 MED ORDER — LIDOCAINE 2% (20 MG/ML) 5 ML SYRINGE
INTRAMUSCULAR | Status: AC
  Filled 2020-05-29: qty 5

## 2020-05-29 MED ORDER — MIDAZOLAM HCL 5 MG/5ML IJ SOLN
INTRAMUSCULAR | Status: DC | PRN
  Administered 2020-05-29 (×2): 1 mg via INTRAVENOUS

## 2020-05-29 MED ORDER — HYDROCODONE-ACETAMINOPHEN 5-325 MG PO TABS
ORAL_TABLET | ORAL | Status: AC
  Filled 2020-05-29: qty 2

## 2020-05-29 MED ORDER — LACTATED RINGERS IV SOLN: INTRAVENOUS | Status: DC

## 2020-05-29 MED ORDER — METOCLOPRAMIDE HCL 5 MG PO TABS
5.0000 mg | ORAL_TABLET | Freq: Three times a day (TID) | ORAL | Status: DC | PRN
  Filled 2020-05-29: qty 2

## 2020-05-29 MED ORDER — CEFAZOLIN SODIUM-DEXTROSE 2-4 GM/100ML-% IV SOLN
INTRAVENOUS | Status: AC
  Filled 2020-05-29: qty 100

## 2020-05-29 MED ORDER — PHENYLEPHRINE 40 MCG/ML (10ML) SYRINGE FOR IV PUSH (FOR BLOOD PRESSURE SUPPORT)
PREFILLED_SYRINGE | INTRAVENOUS | Status: DC | PRN
  Administered 2020-05-29: 80 ug via INTRAVENOUS

## 2020-05-29 MED ORDER — PHENYLEPHRINE 40 MCG/ML (10ML) SYRINGE FOR IV PUSH (FOR BLOOD PRESSURE SUPPORT)
PREFILLED_SYRINGE | INTRAVENOUS | Status: AC
  Filled 2020-05-29: qty 10

## 2020-05-29 MED ORDER — METOCLOPRAMIDE HCL 5 MG/ML IJ SOLN: 5.0000 mg | Freq: Three times a day (TID) | INTRAMUSCULAR | Status: DC | PRN

## 2020-05-29 MED ORDER — LACTATED RINGERS IV BOLUS
500.0000 mL | Freq: Once | INTRAVENOUS | Status: AC
  Administered 2020-05-29: 500 mL via INTRAVENOUS

## 2020-05-29 MED ORDER — SODIUM CHLORIDE (PF) 0.9 % IJ SOLN
INTRAMUSCULAR | Status: AC
  Filled 2020-05-29: qty 50

## 2020-05-29 MED ORDER — CEFAZOLIN SODIUM-DEXTROSE 2-4 GM/100ML-% IV SOLN
2.0000 g | INTRAVENOUS | Status: AC
  Administered 2020-05-29: 2 g via INTRAVENOUS
  Filled 2020-05-29: qty 100

## 2020-05-29 MED ORDER — TRANEXAMIC ACID-NACL 1000-0.7 MG/100ML-% IV SOLN
1000.0000 mg | INTRAVENOUS | Status: AC
  Administered 2020-05-29: 1000 mg via INTRAVENOUS
  Filled 2020-05-29: qty 100

## 2020-05-29 MED ORDER — PHENYLEPHRINE HCL (PRESSORS) 10 MG/ML IV SOLN
INTRAVENOUS | Status: AC
  Filled 2020-05-29: qty 1

## 2020-05-29 MED ORDER — FENTANYL CITRATE (PF) 100 MCG/2ML IJ SOLN
INTRAMUSCULAR | Status: DC | PRN
  Administered 2020-05-29 (×2): 50 ug via INTRAVENOUS

## 2020-05-29 MED ORDER — PROPOFOL 10 MG/ML IV BOLUS
INTRAVENOUS | Status: AC
  Filled 2020-05-29: qty 20

## 2020-05-29 MED ORDER — FENTANYL CITRATE (PF) 100 MCG/2ML IJ SOLN
INTRAMUSCULAR | Status: AC
  Filled 2020-05-29: qty 2

## 2020-05-29 MED ORDER — ONDANSETRON HCL 4 MG PO TABS
4.0000 mg | ORAL_TABLET | Freq: Four times a day (QID) | ORAL | Status: DC | PRN
  Filled 2020-05-29: qty 1

## 2020-05-29 MED ORDER — PROPOFOL 500 MG/50ML IV EMUL
INTRAVENOUS | Status: DC | PRN
  Administered 2020-05-29: 75 ug/kg/min via INTRAVENOUS

## 2020-05-29 MED ORDER — EPHEDRINE SULFATE-NACL 50-0.9 MG/10ML-% IV SOSY
PREFILLED_SYRINGE | INTRAVENOUS | Status: DC | PRN
  Administered 2020-05-29: 10 mg via INTRAVENOUS
  Administered 2020-05-29: 5 mg via INTRAVENOUS
  Administered 2020-05-29: 10 mg via INTRAVENOUS

## 2020-05-29 MED ORDER — GLYCOPYRROLATE PF 0.2 MG/ML IJ SOSY
PREFILLED_SYRINGE | INTRAMUSCULAR | Status: DC | PRN
  Administered 2020-05-29: .1 mg via INTRAVENOUS

## 2020-05-29 MED ORDER — TRANEXAMIC ACID-NACL 1000-0.7 MG/100ML-% IV SOLN
INTRAVENOUS | Status: AC
  Filled 2020-05-29: qty 100

## 2020-05-29 MED ORDER — LIDOCAINE 2% (20 MG/ML) 5 ML SYRINGE
INTRAMUSCULAR | Status: DC | PRN
  Administered 2020-05-29: 40 mg via INTRAVENOUS

## 2020-05-29 MED ORDER — OXYCODONE HCL 5 MG PO TABS: 5.0000 mg | ORAL_TABLET | Freq: Once | ORAL | Status: DC | PRN

## 2020-05-29 MED ORDER — PROPOFOL 1000 MG/100ML IV EMUL
INTRAVENOUS | Status: AC
  Filled 2020-05-29: qty 100

## 2020-05-29 MED ORDER — TRANEXAMIC ACID-NACL 1000-0.7 MG/100ML-% IV SOLN: 1000.0000 mg | Freq: Once | INTRAVENOUS | Status: DC

## 2020-05-29 MED ORDER — ACETAMINOPHEN 10 MG/ML IV SOLN
INTRAVENOUS | Status: AC
  Filled 2020-05-29: qty 100

## 2020-05-29 MED ORDER — ACETAMINOPHEN 10 MG/ML IV SOLN
1000.0000 mg | Freq: Once | INTRAVENOUS | Status: DC | PRN
  Administered 2020-05-29: 1000 mg via INTRAVENOUS

## 2020-05-29 MED ORDER — ONDANSETRON HCL 4 MG/2ML IJ SOLN
INTRAMUSCULAR | Status: DC | PRN
  Administered 2020-05-29: 4 mg via INTRAVENOUS

## 2020-05-29 MED ORDER — ACETAMINOPHEN 160 MG/5ML PO SOLN: 1000.0000 mg | Freq: Once | ORAL | Status: DC | PRN

## 2020-05-29 MED ORDER — HYDROMORPHONE HCL 1 MG/ML IJ SOLN: 1.0000 mg | INTRAMUSCULAR | Status: DC | PRN

## 2020-05-29 MED ORDER — CHLORHEXIDINE GLUCONATE 0.12 % MT SOLN: 15.0000 mL | Freq: Once | OROMUCOSAL | Status: AC

## 2020-05-29 MED ORDER — EPHEDRINE 5 MG/ML INJ
INTRAVENOUS | Status: AC
  Filled 2020-05-29: qty 10

## 2020-05-29 MED ORDER — BUPIVACAINE LIPOSOME 1.3 % IJ SUSP
10.0000 mL | Freq: Once | INTRAMUSCULAR | Status: AC
  Administered 2020-05-29: 10 mL
  Filled 2020-05-29: qty 10

## 2020-05-29 MED ORDER — MIDAZOLAM HCL 2 MG/2ML IJ SOLN
INTRAMUSCULAR | Status: AC
  Filled 2020-05-29: qty 2

## 2020-05-29 MED ORDER — HYDROCODONE-ACETAMINOPHEN 5-325 MG PO TABS
2.0000 | ORAL_TABLET | Freq: Four times a day (QID) | ORAL | Status: DC | PRN
  Administered 2020-05-29: 2 via ORAL

## 2020-05-29 MED ORDER — SODIUM CHLORIDE FLUSH 0.9 % IV SOLN
INTRAVENOUS | Status: DC | PRN
  Administered 2020-05-29: 20 mL

## 2020-05-29 MED ORDER — LACTATED RINGERS IV BOLUS
250.0000 mL | Freq: Once | INTRAVENOUS | Status: AC
  Administered 2020-05-29: 250 mL via INTRAVENOUS

## 2020-05-29 MED ORDER — ORAL CARE MOUTH RINSE
15.0000 mL | Freq: Once | OROMUCOSAL | Status: AC
  Administered 2020-05-29: 15 mL via OROMUCOSAL

## 2020-05-29 MED ORDER — ONDANSETRON HCL 4 MG/2ML IJ SOLN
INTRAMUSCULAR | Status: AC
  Filled 2020-05-29: qty 2

## 2020-05-29 MED ORDER — ACETAMINOPHEN 500 MG PO TABS
1000.0000 mg | ORAL_TABLET | Freq: Once | ORAL | Status: AC
  Administered 2020-05-29: 1000 mg via ORAL
  Filled 2020-05-29: qty 2

## 2020-05-29 MED ORDER — ACETAMINOPHEN 500 MG PO TABS: 1000.0000 mg | ORAL_TABLET | Freq: Once | ORAL | Status: DC | PRN

## 2020-05-29 MED ORDER — GLYCOPYRROLATE PF 0.2 MG/ML IJ SOSY
PREFILLED_SYRINGE | INTRAMUSCULAR | Status: AC
  Filled 2020-05-29: qty 1

## 2020-05-29 SURGICAL SUPPLY — 43 items
BAG ZIPLOCK 12X15 (MISCELLANEOUS) IMPLANT
BLADE SAG 18X100X1.27 (BLADE) ×2 IMPLANT
BLADE SURG SZ10 CARB STEEL (BLADE) IMPLANT
CHLORAPREP W/TINT 26 (MISCELLANEOUS) ×2 IMPLANT
CLSR STERI-STRIP ANTIMIC 1/2X4 (GAUZE/BANDAGES/DRESSINGS) ×2 IMPLANT
COVER PERINEAL POST (MISCELLANEOUS) ×2 IMPLANT
COVER SURGICAL LIGHT HANDLE (MISCELLANEOUS) ×2 IMPLANT
COVER WAND RF STERILE (DRAPES) IMPLANT
DECANTER SPIKE VIAL GLASS SM (MISCELLANEOUS) ×4 IMPLANT
DRAPE IMP U-DRAPE 54X76 (DRAPES) ×2 IMPLANT
DRAPE STERI IOBAN 125X83 (DRAPES) ×2 IMPLANT
DRAPE U-SHAPE 47X51 STRL (DRAPES) ×4 IMPLANT
DRSG MEPILEX BORDER 4X8 (GAUZE/BANDAGES/DRESSINGS) ×2 IMPLANT
ELECT REM PT RETURN 15FT ADLT (MISCELLANEOUS) ×2 IMPLANT
GLOVE BIO SURGEON STRL SZ7.5 (GLOVE) ×4 IMPLANT
GLOVE BIOGEL PI IND STRL 7.5 (GLOVE) ×1 IMPLANT
GLOVE BIOGEL PI IND STRL 8 (GLOVE) ×1 IMPLANT
GLOVE BIOGEL PI INDICATOR 7.5 (GLOVE) ×1
GLOVE BIOGEL PI INDICATOR 8 (GLOVE) ×1
GOWN STRL REUS W/TWL LRG LVL3 (GOWN DISPOSABLE) ×2 IMPLANT
GOWN STRL REUS W/TWL XL LVL3 (GOWN DISPOSABLE) ×2 IMPLANT
HEAD CERAMIC FEMORAL 36MM (Head) ×2 IMPLANT
HOLDER FOLEY CATH W/STRAP (MISCELLANEOUS) IMPLANT
INSERT TRIDENT POLY 36 0DEG (Insert) ×2 IMPLANT
KIT TURNOVER KIT A (KITS) IMPLANT
MANIFOLD NEPTUNE II (INSTRUMENTS) ×2 IMPLANT
NS IRRIG 1000ML POUR BTL (IV SOLUTION) ×2 IMPLANT
PACK ANTERIOR HIP CUSTOM (KITS) ×2 IMPLANT
PROTECTOR NERVE ULNAR (MISCELLANEOUS) ×2 IMPLANT
SCREW HEX LP 6.5X20 (Screw) ×2 IMPLANT
SHELL CLUSTERHOLE ACETABULAR 5 (Shell) ×2 IMPLANT
STEM 37MM HIP (Hips) ×2 IMPLANT
STRIP CLOSURE SKIN 1/2X4 (GAUZE/BANDAGES/DRESSINGS) ×2 IMPLANT
SUT MNCRL AB 3-0 PS2 18 (SUTURE) ×2 IMPLANT
SUT STRATAFIX 0 PDS 27 VIOLET (SUTURE) ×2
SUT VIC AB 0 CT1 36 (SUTURE) ×2 IMPLANT
SUT VIC AB 1 CT1 36 (SUTURE) ×2 IMPLANT
SUT VIC AB 2-0 CT1 27 (SUTURE) ×2
SUT VIC AB 2-0 CT1 TAPERPNT 27 (SUTURE) ×2 IMPLANT
SUTURE STRATFX 0 PDS 27 VIOLET (SUTURE) ×1 IMPLANT
TRAY FOLEY MTR SLVR 16FR STAT (SET/KITS/TRAYS/PACK) IMPLANT
TUBE SUCTION HIGH CAP CLEAR NV (SUCTIONS) ×2 IMPLANT
WATER STERILE IRR 1000ML POUR (IV SOLUTION) ×4 IMPLANT

## 2020-05-29 NOTE — Op Note (Signed)
05/29/2020  9:05 AM  PATIENT:  [name]   MRN: [id number]  PRE-OPERATIVE DIAGNOSIS:  OA RIGHT HIP  POST-OPERATIVE DIAGNOSIS:  OA RIGHT HIP  PROCEDURE:  Procedure(s): TOTAL HIP ARTHROPLASTY ANTERIOR APPROACH  PREOPERATIVE INDICATIONS:    [name] is an 69 y.o. male who has a diagnosis of <principal problem not specified> and elected for surgical management after failing conservative treatment.  The risks benefits and alternatives were discussed with the patient including but not limited to the risks of nonoperative treatment, versus surgical intervention including infection, bleeding, nerve injury, periprosthetic fracture, the need for revision surgery, dislocation, leg length discrepancy, blood clots, cardiopulmonary complications, morbidity, mortality, among others, and they were willing to proceed.     OPERATIVE REPORT     SURGEON:   Renette Butters, MD    ASSISTANT:  Margy Clarks, PA-C, he was present and scrubbed throughout the case, critical for completion in a timely fashion, and for retraction, instrumentation, and closure.     ANESTHESIA:  General    COMPLICATIONS:  None.     COMPONENTS:  [employer] acolade fit femur size 7 with a 36 mm -0 head ball and an acetabular shell size 52 with a  polyethylene liner    PROCEDURE IN DETAIL:   The patient was met in the holding area and  identified.  The appropriate hip was identified and marked at the operative site.  The patient was then transported to the OR  and  placed under anesthesia per that record.  At that point, the patient was  placed in the supine position and  secured to the operating room table and all bony prominences padded. He received pre-operative antibiotics    The operative lower extremity was prepped from the iliac crest to the distal leg.  Sterile draping was performed.  Time out was performed prior to incision.      Skin incision was made just 2 cm lateral to the ASIS  extending in line with  the tensor fascia lata. Electrocautery was used to control all bleeders. I dissected down sharply to the fascia of the tensor fascia lata was confirmed that the muscle fibers beneath were running posteriorly. I then incised the fascia over the superficial tensor fascia lata in line with the incision. The fascia was elevated off the anterior aspect of the muscle the muscle was retracted posteriorly and protected throughout the case. I then used electrocautery to incise the tensor fascia lata fascia control and all bleeders. Immediately visible was the fat over top of the anterior neck and capsule.  I removed the anterior fat from the capsule and elevated the rectus muscle off of the anterior capsule. I then removed a large time of capsule. The retractors were then placed over the anterior acetabulum as well as around the superior and inferior neck.  I then made a femoral neck cut. Then used the power corkscrew to remove the femoral head from the acetabulum and thoroughly irrigated the acetabulum. I sized the femoral head.    I then exposed the deep acetabulum, cleared out any tissue including the ligamentum teres.   After adequate visualization, I excised the labrum, and then sequentially reamed.  I then impacted the acetabular implant into place using fluoroscopy for guidance.  Appropriate version and inclination was confirmed clinically matching their bony anatomy, and with fluoroscopy.  I placed a 20 mm screw in the posterior/superio position with an excellent bite.    I then placed the polyethylene liner in  place  I then adducted the leg and released the external rotators from the posterior femur allowing it to be easily delivered up lateral and anterior to the acetabulum for preparation of the femoral canal.    I then prepared the proximal femur using the cookie-cutter and then sequentially reamed and broached.  A trial broach, neck, and head was utilized, and I reduced the hip and used  floroscopy to assess the neck length and femoral implant.  I then impacted the femoral prosthesis into place into the appropriate version. The hip was then reduced and fluoroscopy confirmed appropriate position. Leg lengths were restored.  I then irrigated the hip copiously again with, and repaired the fascia with Vicryl, followed by monocryl for the subcutaneous tissue, Monocryl for the skin, Steri-Strips and sterile gauze. The patient was then awakened and returned to PACU in stable and satisfactory condition. There were no complications.  POST OPERATIVE PLAN: WBAT, DVT px: SCD's/TED, ambulation and chemical dvt px  Edmonia Lynch, MD Orthopedic Surgeon [phone]

## 2020-05-29 NOTE — Anesthesia Procedure Notes (Signed)
Spinal  Patient location during procedure: OR Start time: 05/29/2020 7:31 AM End time: 05/29/2020 7:37 AM Staffing Performed: resident/CRNA  Resident/CRNA: Claudia Desanctis, CRNA Preanesthetic Checklist Completed: patient identified, IV checked, site marked, risks and benefits discussed, surgical consent, monitors and equipment checked, pre-op evaluation and timeout performed Spinal Block Patient position: sitting Prep: DuraPrep Patient monitoring: heart rate, cardiac monitor, continuous pulse ox and blood pressure Approach: midline Location: L3-4 Injection technique: single-shot Needle Needle type: Sprotte and Pencan  Needle gauge: 24 G Needle length: 10 cm Needle insertion depth: 9 cm Assessment Sensory level: T4 Additional Notes Expiration dates checked, easy insertion after landmarks identified. Pt tolerated without difficulty

## 2020-05-29 NOTE — Evaluation (Signed)
Physical Therapy Evaluation Patient Details Name: [name] MRN: [id number] DOB: 1951-04-10 Today's Date: 05/29/2020   History of Present Illness  Patient 69 y.o. male s/p Rt THA via anterior approach on 05/29/20 with PMH significant for obesity, OA, PAF, Rt RCR, Lt THA, Rt TKA, s/p ICD placement.    Clinical Impression  [name] is a 69 y.o. male POD 0 s/p Rt THA. Patient reports independence with mobility at baseline. Patient is now limited by functional impairments (see PT problem list below) and requires min guard/supervision for transfers and gait with RW. Patient was able to ambulate ~200 feet with RW and min guard/supervision and cues for safe walker management. Patient educated on safe sequencing for stair mobility and verbalized safe guarding position for people assisting with mobility. Patient instructed in exercises to facilitate ROM and circulation. Patient will benefit from continued skilled PT interventions to address impairments and progress towards PLOF. Patient has met mobility goals at adequate level for discharge home; will continue to follow if pt continues acute stay to progress towards Mod I goals.     Follow Up Recommendations Follow surgeon's recommendation for DC plan and follow-up therapies;Home health PT    Equipment Recommendations  Rolling walker with 5" wheels;3in1 (PT)    Recommendations for Other Services       Precautions / Restrictions Precautions Precautions: Fall Restrictions Weight Bearing Restrictions: No      Mobility  Bed Mobility Overal bed mobility: Needs Assistance Bed Mobility: Supine to Sit     Supine to sit: Modified independent (Device/Increase time);Supervision     General bed mobility comments: no assist required, pt taking extra time.    Transfers Overall transfer level: Needs assistance Equipment used: Rolling walker (2 wheeled) Transfers: Sit to/from Stand Sit to Stand: Min guard;Supervision          General transfer comment: no assist required for power up and pt steady standing. cues provided for hand placement at start.  Ambulation/Gait Ambulation/Gait assistance: Supervision;Min guard Gait Distance (Feet): 200 Feet Assistive device: Rolling walker (2 wheeled) Gait Pattern/deviations: Step-through pattern;Decreased stride length;Decreased weight shift to right Gait velocity: decr   General Gait Details: cues for safe step pattern and proximity to RW, no overt LOB with gait and pt maintained safe use of walker  Stairs Stairs: Yes Stairs assistance: Min guard;Supervision Stair Management: Two rails;Forwards;Step to pattern (pt plans to use doorframe as rails ) Number of Stairs: 2 General stair comments: cues for safe sequencing "up with good, down with bad" no overt LOB.  Wheelchair Mobility    Modified Rankin (Stroke Patients Only)       Balance Overall balance assessment: Needs assistance Sitting-balance support: Feet supported Sitting balance-Leahy Scale: Good     Standing balance support: During functional activity;Bilateral upper extremity supported Standing balance-Leahy Scale: Fair                               Pertinent Vitals/Pain Pain Assessment: 0-10 Pain Score: 2  Pain Location: Rt hip Pain Descriptors / Indicators: Aching;Discomfort;Sore;Burning Pain Intervention(s): Limited activity within patient's tolerance;Monitored during session;Repositioned    Home Living Family/patient expects to be discharged to:: Private residence Living Arrangements: Spouse/significant other Available Help at Discharge: Family Type of Home: House Home Access: Stairs to enter Entrance Stairs-Rails: None Entrance Stairs-Number of Steps: 2 Home Layout: One level Home Equipment: None      Prior Function Level of Independence: Independent  Hand Dominance        Extremity/Trunk Assessment   Upper Extremity Assessment Upper  Extremity Assessment: Overall WFL for tasks assessed    Lower Extremity Assessment Lower Extremity Assessment: Overall WFL for tasks assessed    Cervical / Trunk Assessment Cervical / Trunk Assessment: Normal  Communication   Communication: No difficulties  Cognition Arousal/Alertness: Awake/alert Behavior During Therapy: WFL for tasks assessed/performed Overall Cognitive Status: Within Functional Limits for tasks assessed                                        General Comments      Exercises Total Joint Exercises Ankle Circles/Pumps: AROM;Both;20 reps;Seated Quad Sets: AROM;Right;5 reps;Seated Short Arc Quad: AROM;Right;5 reps;Seated Heel Slides: AROM;Right;5 reps;Seated Hip ABduction/ADduction: AROM;Right;5 reps;Seated   Assessment/Plan    PT Assessment Patient needs continued PT services  PT Problem List Decreased strength;Decreased range of motion;Decreased activity tolerance;Decreased balance;Decreased mobility;Decreased knowledge of use of DME;Decreased knowledge of precautions;Obesity       PT Treatment Interventions DME instruction;Stair training;Gait training;Functional mobility training;Therapeutic activities;Therapeutic exercise;Balance training;Patient/family education    PT Goals (Current goals can be found in the Care Plan section)  Acute Rehab PT Goals Patient Stated Goal: recover independence without hurting PT Goal Formulation: With patient Time For Goal Achievement: 05/29/20 Potential to Achieve Goals: Good    Frequency 7X/week   Barriers to discharge        Co-evaluation               AM-PAC PT "6 Clicks" Mobility  Outcome Measure Help needed turning from your back to your side while in a flat bed without using bedrails?: None Help needed moving from lying on your back to sitting on the side of a flat bed without using bedrails?: None Help needed moving to and from a bed to a chair (including a wheelchair)?: A  Little Help needed standing up from a chair using your arms (e.g., wheelchair or bedside chair)?: A Little Help needed to walk in hospital room?: A Little Help needed climbing 3-5 steps with a railing? : A Little 6 Click Score: 20    End of Session Equipment Utilized During Treatment: Gait belt Activity Tolerance: Patient tolerated treatment well Patient left: in chair;with call bell/phone within reach Nurse Communication: Mobility status PT Visit Diagnosis: Muscle weakness (generalized) (M62.81);Difficulty in walking, not elsewhere classified (R26.2)    Time: 7829-5621 PT Time Calculation (min) (ACUTE ONLY): 35 min   Charges:   PT Evaluation $PT Eval Low Complexity: 1 Low PT Treatments $Gait Training: 8-22 mins        Verner Mould, DPT Acute Rehabilitation Services  Office [phone] Pager [phone]  05/29/2020 3:31 PM

## 2020-05-29 NOTE — Transfer of Care (Signed)
Immediate Anesthesia Transfer of Care Note  Patient: [name]  Procedure(s) Performed: TOTAL HIP ARTHROPLASTY ANTERIOR APPROACH (Right Hip)  Patient Location: PACU  Anesthesia Type:Spinal  Level of Consciousness: awake and patient cooperative  Airway & Oxygen Therapy: Patient Spontanous Breathing and Patient connected to face mask  Post-op Assessment: Report given to RN and Post -op Vital signs reviewed and stable  Post vital signs: Reviewed and stable  Last Vitals:  Vitals Value Taken Time  BP 123/75 05/29/20 1000  Temp    Pulse 75 05/29/20 1004  Resp 12 05/29/20 1004  SpO2 100 % 05/29/20 1004  Vitals shown include unvalidated device data.  Last Pain:  Vitals:   05/29/20 0631  TempSrc: Oral  PainSc:          Complications: No complications documented.

## 2020-05-29 NOTE — Discharge Instructions (Signed)
Gaynelle Arabian, MD Total Joint Specialist [facility] [place] [address], [address] [place], [place] [address] [phone]       ANTERIOR APPROACH TOTAL HIP REPLACEMENT POSTOPERATIVE DIRECTIONS FOR SAME DAY DISCHARGE    Hip Rehabilitation, Guidelines Following Surgery  The results of a hip operation are greatly improved after range of motion and muscle strengthening exercises. Follow all safety measures which are given to protect your hip. If any of these exercises cause increased pain or swelling in your joint, decrease the amount until you are comfortable again. Then slowly increase the exercises. Call your caregiver if you have problems or questions.   HOME CARE INSTRUCTIONS  . Remove items at home which could result in a fall. This includes throw rugs or furniture in walking pathways.   ICE to the affected hip as frequently as 20-30 minutes an hour and then as needed for pain and swelling.  Continue to use ice on the hip for pain and swelling from surgery. You may notice swelling that will progress down to the foot and ankle.  This is normal after surgery.  Elevate the leg when you are not up walking on it.    Continue to use the breathing machine which will help keep your temperature down.  It is common for your temperature to cycle up and down following surgery, especially at night when you are not up moving around and exerting yourself.  The breathing machine keeps your lungs expanded and your temperature down.  DIET You may resume your previous home diet once you are discharged from the hospital.  DRESSING / WOUND CARE / SHOWERING . You have an adhesive waterproof bandage over the incision. Leave this in place until your first follow-up appointment. Once you remove this you will not need to place another bandage.  . You may begin showering 3 days following surgery, but do not submerge the incision under water.  ACTIVITY . For the first 3-5 days it is important  to rest and keep the operative leg elevated. You should, as a general rule, rest for 50 minutes per hour and get up and walk/stretch for 10 minutes per hour. After 5 days you can slowly increase activity as tolerated. Marland Kitchen Perform the exercises you were provided twice a day for about 15-20 minutes each session. Begin these 2 days after surgery.  WEIGHT BEARING . You may bear weight as tolerated on your operative leg using a walker for assistance . Walk with your walker as instructed. Use the walker until you are comfortable transitioning to a cane. Walk with the cane in the opposite hand of the operative leg. You may discontinue the cane once you are comfortable and walking steadily.  POSTOPERATIVE CONSTIPATION PROTOCOL Constipation - defined medically as fewer than three stools per week and severe constipation as less than one stool per week.  One of the most common issues patients have following surgery is constipation.  Even if you have a regular bowel pattern at home, your normal regimen is likely to be disrupted due to multiple reasons following surgery.  Combination of anesthesia, postoperative narcotics, change in appetite and fluid intake all can affect your bowels.  In order to avoid complications following surgery, here are some recommendations in order to help you during your recovery period.  Colace (docusate) - Pick up an over-the-counter form of Colace or another stool softener and take twice a day as long as you are requiring postoperative pain medications.  Take with a full  glass of water daily.  If you experience loose stools or diarrhea, hold the colace until you stool forms back up.  If your symptoms do not get better within 1 week or if they get worse, check with your doctor  MiraLax (polyethylene glycol) - Pick up over-the-counter to have on hand.  MiraLax is a solution that will increase the amount of water in your bowels to assist with bowel movements.  Take as directed and can mix  with a glass of water, juice, soda, coffee, or tea.  Take if you go more than two days without a movement. Do not use MiraLax more than once per day. Call your doctor if you are still constipated or irregular after using this medication for 5 days in a row.  If you continue to have problems with postoperative constipation, please contact the office for further assistance and recommendations.  If you experience "the worst abdominal pain ever" or develop nausea or vomiting, please contact the office immediatly for further recommendations for treatment.  ITCHING  If you experience itching with your medications, try taking only a single pain pill, or even half a pain pill at a time.  You can also use Benadryl over the counter for itching or also to help with sleep.   TED HOSE STOCKINGS Wear the elastic stockings on both legs for three weeks following surgery during the day but you may remove then at night for sleeping.  MEDICATIONS See your medication summary on the "After Visit Summary" that the nursing staff will review with you prior to discharge.  You may have some home medications which will be placed on hold until you complete the course of blood thinner medication.  It is important for you to complete the blood thinner medication as prescribed by your surgeon.  Continue your approved medications as instructed at time of discharge.  PRECAUTIONS If you experience chest pain or shortness of breath - call 911 immediately for transfer to the hospital emergency department.  If you develop a fever greater that 101 F, purulent drainage from wound, increased redness or drainage from wound, foul odor from the wound/dressing, or calf pain - CONTACT YOUR SURGEON.                                                   FOLLOW-UP APPOINTMENTS Make sure you keep all of your appointments after your operation with your surgeon and caregivers. You should call the office at the above phone number and make an  appointment for approximately two weeks after the date of your surgery or on the date instructed by your surgeon outlined in the "After Visit Summary".  MAKE SURE YOU:  . Understand these instructions.  . Get help right away if you are not doing well or get worse.   DENTAL ANTIBIOTICS:  In most cases prophylactic antibiotics for Dental procdeures after total joint surgery are not necessary.  Exceptions are as follows:  1. History of prior total joint infection  2. Severely immunocompromised (Organ Transplant, cancer chemotherapy, Rheumatoid biologic meds such as [place])  3. Poorly controlled diabetes (A1C &gt; 8.0, blood glucose over 200)  If you have one of these conditions, contact your surgeon for an antibiotic prescription, prior to your dental procedure.   Pick up stool softner and laxative for home use following surgery while on pain medications.  May shower starting three days after surgery. Continue to use ice for pain and swelling after surgery. Do not use any lotions or creams on the incision until instructed by your surgeon.

## 2020-05-29 NOTE — Interval H&P Note (Signed)
History and Physical Interval Note:  05/29/2020 7:16 AM  [name]  has presented today for surgery, with the diagnosis of OA RIGHT HIP.  The various methods of treatment have been discussed with the patient and family. After consideration of risks, benefits and other options for treatment, the patient has consented to  Procedure(s): TOTAL HIP ARTHROPLASTY ANTERIOR APPROACH (Right) as a surgical intervention.  The patient's history has been reviewed, patient examined, no change in status, stable for surgery.  I have reviewed the patient's chart and labs.  Questions were answered to the patient's satisfaction.     Renette Butters

## 2020-05-29 NOTE — Progress Notes (Signed)
Per [employer] rep, Mickel Baas , No intervention needed for patient's ICD since his surgery is below the umbilical area. Dr. Ermalene Postin aware.

## 2020-05-29 NOTE — Anesthesia Preprocedure Evaluation (Addendum)
Anesthesia Evaluation  Patient identified by MRN, date of birth, ID band Patient awake    Reviewed: Allergy & Precautions, NPO status , Patient's Chart, lab work & pertinent test results  History of Anesthesia Complications (+) PONV and history of anesthetic complications  Airway Mallampati: III  TM Distance: >3 FB Neck ROM: Full    Dental  (+) Dental Advisory Given   Pulmonary sleep apnea ,  Covid-19 Nucleic Acid Test Results Lab Results      Component                Value               Date                      SARSCOV2NAA              NEGATIVE            05/25/2020                [place]              NEGATIVE            12/17/2019                [place]              NEGATIVE            08/19/2019                [place]              NOT DETECTED        04/23/2019                [place]              NEGATIVE            03/07/2019              breath sounds clear to auscultation       Cardiovascular +CHF  + Cardiac Defibrillator  Rhythm:Regular  1. Left ventricular ejection fraction, by estimation, is 60 to 65%. The  left ventricle has normal function. The left ventricle has no regional  wall motion abnormalities. The left ventricular internal cavity size was  mildly dilated. There is mild left  ventricular hypertrophy. Left ventricular diastolic parameters are  indeterminate.  2. Right ventricular systolic function is normal. The right ventricular  size is moderately enlarged. There is normal pulmonary artery systolic  pressure. The estimated right ventricular systolic pressure is 94.5 mmHg.  3. Left atrial size was severely dilated.  4. Right atrial size was mildly dilated.  5. The mitral valve has been repaired/replaced. There is a prosthetic  annuloplasty ring present in the mitral position. No evidence of mitral  valve regurgitation. Mean gradient 3 mmHg at 60bpm.  6. The aortic valve has been  repaired/replaced. There is a prosthetic  annuloplasty ring present in the aortic position. Aortic valve  regurgitation is trivial. Aortic valve mean gradient measures 9.0 mmHg.  7. The inferior vena cava is normal in size with greater than 50%  respiratory variability, suggesting right atrial pressure of 3 mmHg.  8. Aortic dilatation noted. Aneurysm of the ascending aorta, measuring 46  mm.   S/p AVR, MVR, MAze   There is no evidence of ischemia  The LV is moderately dilated  The study is not gated so the LV function was not  assessed.  Suggest echo for assessment of LV function .  Intermittant risk myoview based on LV dilitation and likely reduced EF   1. No angiographically significant coronary artery disease. 2. Mildly to moderately elevated left heart filling pressures.  Prominent V-waves and elevated PCWP relative to LVEDP support moderate to severe mitral regurgitation noted on today's TEE. 3. Moderately elevated right heart and pulmonary artery pressures. 4. Moderately reduced Fick cardiac output/index. 5. New LBBB after crossing the aortic valve for left heart catheterization.    Neuro/Psych negative neurological ROS  negative psych ROS   GI/Hepatic negative GI ROS, Neg liver ROS,   Endo/Other  negative endocrine ROS  Renal/GU Renal InsufficiencyRenal diseaseLab Results      Component                Value               Date                      CREATININE               1.46 (H)            05/18/2020           Lab Results      Component                Value               Date                      K                        4.6                 05/18/2020                Musculoskeletal  (+) Arthritis ,   Abdominal   Peds  Hematology Lab Results      Component                Value               Date                      WBC                      7.9                 05/18/2020                HGB                      13.7                05/18/2020                 HCT                      41.2                05/18/2020                MCV                      87.7  05/18/2020                PLT                      162                 05/18/2020            xarelto last dose 11/13 0630   Anesthesia Other Findings Pt last seen by cardiology 04/27/2020.  Per OV note, "Pt is at least low (more likely moderate) risk for perioperative complications from a cardiac perspective by the revised cardiac risk index (lee criteria).  He is cleared to proceed with hip surgery without further work up from a cardiac perspective."   Reproductive/Obstetrics                            Anesthesia Physical Anesthesia Plan  ASA: III  Anesthesia Plan: MAC and Spinal   Post-op Pain Management:    Induction:   PONV Risk Score and Plan: 2 and Propofol infusion and Treatment may vary due to age or medical condition  Airway Management Planned: Nasal Cannula  Additional Equipment: None  Intra-op Plan:   Post-operative Plan:   Informed Consent: I have reviewed the patients History and Physical, chart, labs and discussed the procedure including the risks, benefits and alternatives for the proposed anesthesia with the patient or authorized representative who has indicated his/her understanding and acceptance.     Dental advisory given  Plan Discussed with: CRNA and Surgeon  Anesthesia Plan Comments:         Anesthesia Quick Evaluation

## 2020-05-30 ENCOUNTER — Encounter (HOSPITAL_COMMUNITY): Payer: Self-pay | Admitting: Orthopedic Surgery

## 2020-06-01 ENCOUNTER — Telehealth: Payer: Self-pay

## 2020-06-01 DIAGNOSIS — I4819 Other persistent atrial fibrillation: Secondary | ICD-10-CM | POA: Diagnosis not present

## 2020-06-01 DIAGNOSIS — Z96643 Presence of artificial hip joint, bilateral: Secondary | ICD-10-CM | POA: Diagnosis not present

## 2020-06-01 DIAGNOSIS — Z95810 Presence of automatic (implantable) cardiac defibrillator: Secondary | ICD-10-CM | POA: Diagnosis not present

## 2020-06-01 DIAGNOSIS — I080 Rheumatic disorders of both mitral and aortic valves: Secondary | ICD-10-CM | POA: Diagnosis not present

## 2020-06-01 DIAGNOSIS — H9193 Unspecified hearing loss, bilateral: Secondary | ICD-10-CM | POA: Diagnosis not present

## 2020-06-01 DIAGNOSIS — M1990 Unspecified osteoarthritis, unspecified site: Secondary | ICD-10-CM | POA: Diagnosis not present

## 2020-06-01 DIAGNOSIS — G4733 Obstructive sleep apnea (adult) (pediatric): Secondary | ICD-10-CM | POA: Diagnosis not present

## 2020-06-01 DIAGNOSIS — E669 Obesity, unspecified: Secondary | ICD-10-CM | POA: Diagnosis not present

## 2020-06-01 DIAGNOSIS — I119 Hypertensive heart disease without heart failure: Secondary | ICD-10-CM | POA: Diagnosis not present

## 2020-06-01 DIAGNOSIS — Z471 Aftercare following joint replacement surgery: Secondary | ICD-10-CM | POA: Diagnosis not present

## 2020-06-01 NOTE — Telephone Encounter (Signed)
EPIC Encounter for ICM Monitoring  Patient Name: [name] is a 69 y.o. male Date: 06/01/2020 Primary Care Physican: Seward Carol, MD Primary Cardiologist: Aundra Dubin Electrophysiologist: Allred Bi-V Pacing: >99%  06/01/2020 Weight: 276-277 lbs        ICM call to patient. He had total hip surgery on 05/29/2020.  He reports weight gain of 4 lbs after surgery due to holding diuretic for a couple of days due to surgery.   He has resumed taking Torsemide and taking extra as approved by physician.     CorVue thoracic impedance normal fluid levels.    Prescribed:   Torsemide20 mg take3tablets (60 mg total) daily. He reports Dr Rayann Heman approved of him taking extra Torsemide when needed.  Klor Con 20 mEq take 1 tablet twice a day.   Spironolactone 25 mg take 1 tablet daily  Labs: 05/18/2020 Creatinine 1.46, BUN 45, Potassium 4.6, Sodium 137 03/23/2020 Creatinine 1.44, BUN 33, Potassium 4.0, Sodium 138, GFR 50-58 03/06/2020 Creatinine 1.42, BUN 42, Potassium 4.3, Sodium 140, GFR 51-59 12/15/2019 Creatinine1.38, BUN46, Potassium4.3, Sodium138, GFR53-61 09/27/2019 Creatinine1.32, BUN36, Potassium4.7, Sodium140, GFR56->60  08/09/2019 Creatinine1.39, BUN38, Potassium4.1, Sodium139, GFR52->60 A complete set of results can be found in Results Review.  Recommendations:Advised to continue his approved regimen of taking extra Torsemide and if weight does not return to baseline within 3 days to call office.  Follow-up plan: ICM clinic phone appointment on11/30/2021 to recheck fluid levels  3 month ICM trend: 05/30/2020    Rosalene Billings, RN 06/01/2020 12:46 PM

## 2020-06-03 ENCOUNTER — Encounter (HOSPITAL_COMMUNITY): Payer: Self-pay | Admitting: Orthopedic Surgery

## 2020-06-03 NOTE — Anesthesia Postprocedure Evaluation (Signed)
Anesthesia Post Note  Patient: [name]  Procedure(s) Performed: TOTAL HIP ARTHROPLASTY ANTERIOR APPROACH (Right Hip)     Patient location during evaluation: PACU Anesthesia Type: MAC and Spinal Level of consciousness: awake and alert Pain management: pain level controlled Vital Signs Assessment: post-procedure vital signs reviewed and stable Respiratory status: spontaneous breathing, nonlabored ventilation, respiratory function stable and patient connected to nasal cannula oxygen Cardiovascular status: stable and blood pressure returned to baseline Postop Assessment: no apparent nausea or vomiting Anesthetic complications: no   No complications documented.  Last Vitals:  Vitals:   05/29/20 1030 05/29/20 1100  BP: 116/68 133/72  Pulse: 81 65  Resp: 12   Temp: 36.7 C   SpO2: 100% 96%    Last Pain:  Vitals:   05/29/20 1100  TempSrc:   PainSc: 5                  Morgan Keinath

## 2020-06-05 DIAGNOSIS — M1990 Unspecified osteoarthritis, unspecified site: Secondary | ICD-10-CM | POA: Diagnosis not present

## 2020-06-05 DIAGNOSIS — Z95810 Presence of automatic (implantable) cardiac defibrillator: Secondary | ICD-10-CM | POA: Diagnosis not present

## 2020-06-05 DIAGNOSIS — G4733 Obstructive sleep apnea (adult) (pediatric): Secondary | ICD-10-CM | POA: Diagnosis not present

## 2020-06-05 DIAGNOSIS — I119 Hypertensive heart disease without heart failure: Secondary | ICD-10-CM | POA: Diagnosis not present

## 2020-06-05 DIAGNOSIS — E669 Obesity, unspecified: Secondary | ICD-10-CM | POA: Diagnosis not present

## 2020-06-05 DIAGNOSIS — I4819 Other persistent atrial fibrillation: Secondary | ICD-10-CM | POA: Diagnosis not present

## 2020-06-05 DIAGNOSIS — I080 Rheumatic disorders of both mitral and aortic valves: Secondary | ICD-10-CM | POA: Diagnosis not present

## 2020-06-05 DIAGNOSIS — H9193 Unspecified hearing loss, bilateral: Secondary | ICD-10-CM | POA: Diagnosis not present

## 2020-06-05 DIAGNOSIS — Z96643 Presence of artificial hip joint, bilateral: Secondary | ICD-10-CM | POA: Diagnosis not present

## 2020-06-05 DIAGNOSIS — Z471 Aftercare following joint replacement surgery: Secondary | ICD-10-CM | POA: Diagnosis not present

## 2020-06-09 DIAGNOSIS — G4733 Obstructive sleep apnea (adult) (pediatric): Secondary | ICD-10-CM | POA: Diagnosis not present

## 2020-06-09 DIAGNOSIS — Z96643 Presence of artificial hip joint, bilateral: Secondary | ICD-10-CM | POA: Diagnosis not present

## 2020-06-09 DIAGNOSIS — M1990 Unspecified osteoarthritis, unspecified site: Secondary | ICD-10-CM | POA: Diagnosis not present

## 2020-06-09 DIAGNOSIS — H9193 Unspecified hearing loss, bilateral: Secondary | ICD-10-CM | POA: Diagnosis not present

## 2020-06-09 DIAGNOSIS — I4819 Other persistent atrial fibrillation: Secondary | ICD-10-CM | POA: Diagnosis not present

## 2020-06-09 DIAGNOSIS — I119 Hypertensive heart disease without heart failure: Secondary | ICD-10-CM | POA: Diagnosis not present

## 2020-06-09 DIAGNOSIS — E669 Obesity, unspecified: Secondary | ICD-10-CM | POA: Diagnosis not present

## 2020-06-09 DIAGNOSIS — I080 Rheumatic disorders of both mitral and aortic valves: Secondary | ICD-10-CM | POA: Diagnosis not present

## 2020-06-09 DIAGNOSIS — Z95810 Presence of automatic (implantable) cardiac defibrillator: Secondary | ICD-10-CM | POA: Diagnosis not present

## 2020-06-09 DIAGNOSIS — Z471 Aftercare following joint replacement surgery: Secondary | ICD-10-CM | POA: Diagnosis not present

## 2020-06-13 DIAGNOSIS — M1611 Unilateral primary osteoarthritis, right hip: Secondary | ICD-10-CM | POA: Diagnosis not present

## 2020-06-17 ENCOUNTER — Other Ambulatory Visit: Payer: Self-pay

## 2020-06-17 ENCOUNTER — Emergency Department (HOSPITAL_COMMUNITY)
Admission: EM | Admit: 2020-06-17 | Discharge: 2020-06-18 | Disposition: A | Discharge status: Home / Self Care | Payer: Medicare HMO | Attending: Emergency Medicine | Admitting: Emergency Medicine

## 2020-06-17 ENCOUNTER — Emergency Department (HOSPITAL_COMMUNITY): Payer: Medicare HMO

## 2020-06-17 ENCOUNTER — Encounter (HOSPITAL_COMMUNITY): Payer: Self-pay | Admitting: Emergency Medicine

## 2020-06-17 DIAGNOSIS — Z7901 Long term (current) use of anticoagulants: Secondary | ICD-10-CM | POA: Insufficient documentation

## 2020-06-17 DIAGNOSIS — I110 Hypertensive heart disease with heart failure: Secondary | ICD-10-CM | POA: Insufficient documentation

## 2020-06-17 DIAGNOSIS — R1110 Vomiting, unspecified: Secondary | ICD-10-CM | POA: Diagnosis not present

## 2020-06-17 DIAGNOSIS — R1013 Epigastric pain: Secondary | ICD-10-CM | POA: Insufficient documentation

## 2020-06-17 DIAGNOSIS — I5043 Acute on chronic combined systolic (congestive) and diastolic (congestive) heart failure: Secondary | ICD-10-CM | POA: Insufficient documentation

## 2020-06-17 DIAGNOSIS — Z96652 Presence of left artificial knee joint: Secondary | ICD-10-CM | POA: Insufficient documentation

## 2020-06-17 DIAGNOSIS — Z96641 Presence of right artificial hip joint: Secondary | ICD-10-CM | POA: Diagnosis not present

## 2020-06-17 DIAGNOSIS — Z79899 Other long term (current) drug therapy: Secondary | ICD-10-CM | POA: Diagnosis not present

## 2020-06-17 DIAGNOSIS — R1011 Right upper quadrant pain: Secondary | ICD-10-CM | POA: Diagnosis not present

## 2020-06-17 DIAGNOSIS — I4891 Unspecified atrial fibrillation: Secondary | ICD-10-CM | POA: Insufficient documentation

## 2020-06-17 DIAGNOSIS — R109 Unspecified abdominal pain: Secondary | ICD-10-CM | POA: Diagnosis not present

## 2020-06-17 DIAGNOSIS — R112 Nausea with vomiting, unspecified: Secondary | ICD-10-CM | POA: Insufficient documentation

## 2020-06-17 DIAGNOSIS — K760 Fatty (change of) liver, not elsewhere classified: Secondary | ICD-10-CM | POA: Diagnosis not present

## 2020-06-17 DIAGNOSIS — Z96642 Presence of left artificial hip joint: Secondary | ICD-10-CM | POA: Insufficient documentation

## 2020-06-17 LAB — CBC
HCT: 40.2 % (ref 39.0–52.0)
Hemoglobin: 14.3 g/dL (ref 13.0–17.0)
MCH: 28.9 pg (ref 26.0–34.0)
MCHC: 35.6 g/dL (ref 30.0–36.0)
MCV: 81.4 fL (ref 80.0–100.0)
Platelets: 326 10*3/uL (ref 150–400)
RBC: 4.94 MIL/uL (ref 4.22–5.81)
RDW: 12.7 % (ref 11.5–15.5)
WBC: 14.6 10*3/uL — ABNORMAL HIGH (ref 4.0–10.5)
nRBC: 0 % (ref 0.0–0.2)

## 2020-06-17 LAB — COMPREHENSIVE METABOLIC PANEL
ALT: 18 U/L (ref 0–44)
AST: 21 U/L (ref 15–41)
Albumin: 3.8 g/dL (ref 3.5–5.0)
Alkaline Phosphatase: 70 U/L (ref 38–126)
Anion gap: 14 (ref 5–15)
BUN: 21 mg/dL (ref 8–23)
CO2: 20 mmol/L — ABNORMAL LOW (ref 22–32)
Calcium: 9.4 mg/dL (ref 8.9–10.3)
Chloride: 98 mmol/L (ref 98–111)
Creatinine, Ser: 1.27 mg/dL — ABNORMAL HIGH (ref 0.61–1.24)
GFR, Estimated: >60 mL/min (ref >60)
Glucose, Bld: 121 mg/dL — ABNORMAL HIGH (ref 70–99)
Potassium: 3.3 mmol/L — ABNORMAL LOW (ref 3.5–5.1)
Sodium: 132 mmol/L — ABNORMAL LOW (ref 135–145)
Total Bilirubin: 1.2 mg/dL (ref 0.3–1.2)
Total Protein: 6.5 g/dL (ref 6.5–8.1)

## 2020-06-17 LAB — URINALYSIS, ROUTINE W REFLEX MICROSCOPIC
Bilirubin Urine: NEGATIVE
Glucose, UA: NEGATIVE mg/dL
Hgb urine dipstick: NEGATIVE
Ketones, ur: 5 mg/dL — AB
Leukocytes,Ua: NEGATIVE
Nitrite: NEGATIVE
Protein, ur: NEGATIVE mg/dL
Specific Gravity, Urine: 1.03 (ref 1.005–1.030)
pH: 6 (ref 5.0–8.0)

## 2020-06-17 LAB — LIPASE, BLOOD: Lipase: 83 U/L — ABNORMAL HIGH (ref 11–51)

## 2020-06-17 MED ORDER — FENTANYL CITRATE (PF) 100 MCG/2ML IJ SOLN: 50.0000 ug | Freq: Once | INTRAMUSCULAR | Status: DC

## 2020-06-17 MED ORDER — IOHEXOL 300 MG/ML  SOLN
100.0000 mL | Freq: Once | INTRAMUSCULAR | Status: AC | PRN
  Administered 2020-06-17: 100 mL via INTRAVENOUS

## 2020-06-17 MED ORDER — SODIUM CHLORIDE 0.9 % IV BOLUS: 1000.0000 mL | Freq: Once | INTRAVENOUS | Status: DC

## 2020-06-17 MED ORDER — OMEPRAZOLE 20 MG PO CPDR: 20.0000 mg | DELAYED_RELEASE_CAPSULE | Freq: Every day | ORAL | 0 refills | Status: DC

## 2020-06-17 MED ORDER — SODIUM CHLORIDE 0.9 % IV BOLUS
1000.0000 mL | Freq: Once | INTRAVENOUS | Status: AC
  Administered 2020-06-17: 1000 mL via INTRAVENOUS

## 2020-06-17 MED ORDER — ONDANSETRON HCL 4 MG/2ML IJ SOLN
4.0000 mg | Freq: Once | INTRAMUSCULAR | Status: AC
  Administered 2020-06-17: 4 mg via INTRAVENOUS
  Filled 2020-06-17: qty 2

## 2020-06-17 MED ORDER — FENTANYL CITRATE (PF) 100 MCG/2ML IJ SOLN
50.0000 ug | Freq: Once | INTRAMUSCULAR | Status: AC
  Administered 2020-06-17: 50 ug via INTRAVENOUS
  Filled 2020-06-17: qty 2

## 2020-06-17 MED ORDER — ONDANSETRON 4 MG PO TBDP: 4.0000 mg | ORAL_TABLET | Freq: Three times a day (TID) | ORAL | 0 refills | Status: DC | PRN

## 2020-06-17 MED ORDER — SUCRALFATE 1 G PO TABS: 1.0000 g | ORAL_TABLET | Freq: Three times a day (TID) | ORAL | 0 refills | Status: DC

## 2020-06-17 NOTE — Discharge Instructions (Signed)
As we discussed, you had a small bump in your white blood cell count.  Additionally, your pancreatic enzyme was slightly elevated.  This could be due to gastritis or viral GI process.  Your ultrasound and CT scan looked reassuring.  We will plan to treat this with nausea/vomiting medication as well as medication to help possible ulcer versus gastritis.  We have also provided you a GI doctor that he can follow-up with if symptoms continue to persist.  Please have your primary care doctor recheck your labs in about 1 to 2 weeks to ensure that your blood work has gone back to normal.  Return the emergency department for any worsening abdominal pain, nausea/vomiting, fever, difficulty breathing or any other worsening concerning symptoms.

## 2020-06-17 NOTE — ED Notes (Signed)
Pt to CT

## 2020-06-17 NOTE — ED Triage Notes (Signed)
Pt reports generalized abd pain since Wednesday with nausea and vomiting.  States he recently had hip surgery and has been constipated.  Used suppository and enema on Friday.  Pt states his wife had "[facility]" come to his house today and give him 2 liters of IV fluid and medication for vomiting.  Unsure name of medication.

## 2020-06-17 NOTE — ED Provider Notes (Signed)
[facility] EMERGENCY DEPARTMENT Provider Note   CSN: [id number] Arrival date & time: 06/17/20  1731     History Chief Complaint  Patient presents with  . Abdominal Pain    [name] is a 69 y.o. male possible history of AICD, aortic insufficiency, atrial large man, obesity, A. fib, recent hip surgery who presents for evaluation of abdominal pain, nausea/vomiting x5 days.  He states that about 5 days ago, he is having some upper abdominal pain.  He states it is slightly worse in the epigastric region.  He describes it as a stabbing pain that is pretty consistent.  He also started having some nausea and vomiting.  Reports multiple episodes of nonbloody, nonbilious vomiting.  He states that he tried eating something about 3 days ago and was able to keep it down but then the next morning started having vomiting again.  He states he had been constipated which he attributed to him taking the hydrocodone.  He got an enema done a couple days ago and had a bowel movement.  His last bowel movement was yesterday.  He is still passing gas.  He has not noted any fevers, chills.  Denies any dysuria, hematuria.  No chest pain, difficulty breathing.  The history is provided by the patient.       Past Medical History:  Diagnosis Date  . AICD (automatic cardioverter/defibrillator) present   . Aortic insufficiency 04/21/2018  . Aortic insufficiency   . Arthritis    "joints" (09/26/2013)  . Atrial enlargement, left   . Hearing aid worn    both ears  . Mitral regurgitation 04/21/2018  . Obesity   . OSA on CPAP    "mask adjusts to what I need" (09/26/2013)  . Persistent atrial fibrillation ([place])    cardioversion 06/06/13  . PONV (postoperative nausea and vomiting)   . Rotator cuff tear, right dec 2012   physical therapy done, decreased strength  . S/P aortic valve repair 05/18/2018   Complex valvuloplasty including plication of right coronary leaflet and 21 mm Biostable HAART  annuloplasty ring  . S/P Maze operation for atrial fibrillation 05/18/2018   Complete bilateral atrial lesion set using bipolar radiofrequency and cryothermy ablation with clipping of LA appendage  . S/P mitral valve repair 05/18/2018   Complex valvuloplasty including artificial Gore-tex neochord placement x4 and 30 mm Sorin Memo 4D ring annuloplasty  . Wears glasses     Patient Active Problem List   Diagnosis Date Noted  . Chronic systolic heart failure ([place]) 07/11/2019  . Acquired thrombophilia ([place]) 05/25/2019  . Atypical atrial flutter ([place])   . S/P aortic valve repair 05/18/2018  . S/P mitral valve repair 05/18/2018  . S/P Maze operation for atrial fibrillation 05/18/2018  . Acute on chronic combined systolic (congestive) and diastolic (congestive) heart failure ([place]) 04/21/2018  . Mitral regurgitation 04/21/2018  . Aortic insufficiency 04/21/2018  . Obesity (BMI 30-39.9)   . Chronic diastolic congestive heart failure ([place])   . Hypertensive cardiovascular disease 07/20/2013  . Left atrial enlargement 07/20/2013  . Obstructive sleep apnea 05/11/2013  . Persistent atrial fibrillation ([place])   . Instability of prosthetic hip ([place]) 04/09/2012  . Pain due to total hip replacement ([place]) 10/08/2011    Past Surgical History:  Procedure Laterality Date  . AORTIC VALVE REPAIR N/A 05/18/2018   Procedure: AORTIC VALVE REPAIR using HAART 300 Aortic Annuloplasty Device size 57mm;  Surgeon: Rexene Alberts, MD;  Location: [facility];  Service: Open Heart  Surgery;  Laterality: N/A;  . ATRIAL FIBRILLATION ABLATION N/A 04/26/2019   Procedure: ATRIAL FIBRILLATION ABLATION;  Surgeon: Thompson Grayer, MD;  Location: [facility] CV [facility];  Service: Cardiovascular;  Laterality: N/A;  . AV NODE ABLATION N/A 12/20/2019   Procedure: AV NODE ABLATION;  Surgeon: Thompson Grayer, MD;  Location: [facility];  Service: Cardiovascular;  Laterality: N/A;  . BIV ICD INSERTION CRT-D N/A 08/23/2019   Procedure: BIV ICD  INSERTION CRT-D;  Surgeon: Thompson Grayer, MD;  Location: [facility];  Service: Cardiovascular;  Laterality: N/A;  . CARDIOVERSION N/A 06/06/2013   Procedure: CARDIOVERSION;  Surgeon: Sanda Klein, MD;  Location: [facility];  Service: Cardiovascular;  Laterality: N/A;  . CARDIOVERSION N/A 09/28/2013   Procedure: CARDIOVERSION BEDSIDE;  Surgeon: Peter M Martinique, MD;  Location: [facility];  Service: Cardiovascular;  Laterality: N/A;  . CARDIOVERSION N/A 11/30/2018   Procedure: CARDIOVERSION;  Surgeon: Dorothy Spark, MD;  Location: [facility] ENDOSCOPY;  Service: Cardiovascular;  Laterality: N/A;  . CARDIOVERSION N/A 03/10/2019   Procedure: CARDIOVERSION;  Surgeon: [name], MD;  Location: [facility];  Service: Endoscopy;  Laterality: N/A;  . CARPAL TUNNEL RELEASE Right 08/08/2013   Procedure: RIGHT CARPAL TUNNEL RELEASE;  Surgeon: Wynonia Sours, MD;  Location: [facility];  Service: Orthopedics;  Laterality: Right;  . CARPAL TUNNEL RELEASE Left 2008  . CLIPPING OF ATRIAL APPENDAGE  05/18/2018   Procedure: CLIPPING OF ATRIAL APPENDAGE using AtriCure Clip size 45;  Surgeon: Rexene Alberts, MD;  Location: [facility];  Service: Open Heart Surgery;;  . KNEE ARTHROSCOPY Right 1990's  . MAZE N/A 05/18/2018   Procedure: MAZE;  Surgeon: Rexene Alberts, MD;  Location: [facility];  Service: Open Heart Surgery;  Laterality: N/A;  . MITRAL VALVE REPAIR N/A 05/18/2018   Procedure: MITRAL VALVE REPAIR (MVR) using 4D Memo Ring size 30;  Surgeon: Rexene Alberts, MD;  Location: [facility];  Service: Open Heart Surgery;  Laterality: N/A;  . RIGHT/LEFT HEART CATH AND CORONARY ANGIOGRAPHY N/A 04/21/2018   Procedure: RIGHT/LEFT HEART CATH AND CORONARY ANGIOGRAPHY;  Surgeon: Nelva Bush, MD;  Location: [facility] CV LAB;  Service: Cardiovascular;  Laterality: N/A;  . SHOULDER OPEN ROTATOR CUFF REPAIR Left 1990's  . SHOULDER OPEN ROTATOR CUFF REPAIR Right 11/2007  . TEE WITHOUT CARDIOVERSION N/A  04/21/2018   Procedure: TRANSESOPHAGEAL ECHOCARDIOGRAM (TEE);  Surgeon: Jerline Pain, MD;  Location: [facility] ENDOSCOPY;  Service: Cardiovascular;  Laterality: N/A;  . TEE WITHOUT CARDIOVERSION N/A 05/18/2018   Procedure: TRANSESOPHAGEAL ECHOCARDIOGRAM (TEE);  Surgeon: Rexene Alberts, MD;  Location: [facility];  Service: Open Heart Surgery;  Laterality: N/A;  . TONSILLECTOMY  1950's  . TOTAL HIP ARTHROPLASTY Left 2010  . TOTAL HIP ARTHROPLASTY Right 05/29/2020   Procedure: TOTAL HIP ARTHROPLASTY ANTERIOR APPROACH;  Surgeon: Renette Butters, MD;  Location: [facility];  Service: Orthopedics;  Laterality: Right;  . TOTAL HIP REVISION Left 10/08/2011  . TOTAL HIP REVISION  04/09/2012   Procedure: TOTAL HIP REVISION;  Surgeon: Gearlean Alf, MD;  Location: [facility];  Service: Orthopedics;  Laterality: Left;  Left Hip Acetabular Revision vs Constrained Liner  . TOTAL KNEE ARTHROPLASTY Right 2006   . TRIGGER FINGER RELEASE Right 08/08/2013   Procedure: RELEASE STENOSING TENOSYNOVITIS RIGHT THUMB;  Surgeon: Wynonia Sours, MD;  Location: [facility];  Service: Orthopedics;  Laterality: Right;       Family History  Problem Relation Age of Onset  .  Other Other        mother had a pacemaker  . Hypertension Father     Social History   Tobacco Use  . Smoking status: Never Smoker  . Smokeless tobacco: Never Used  Vaping Use  . Vaping Use: Never used  Substance Use Topics  . Alcohol use: No  . Drug use: No    Home Medications Prior to Admission medications   Medication Sig Start Date End Date Taking? Authorizing Provider  acetaminophen (TYLENOL) 325 MG tablet Take 2 tablets (650 mg total) by mouth every 4 (four) hours as needed for headache or mild pain. Patient not taking: Reported on 05/15/2020 12/21/19   Shirley Friar, PA-C  Ascorbic Acid (VITAMIN C) 1000 MG tablet Take 1,000 mg by mouth daily.    [provider]  carvedilol (COREG) 6.25 MG tablet Take 1 tablet (6.25 mg  total) by mouth 2 (two) times daily. 09/29/19   Shirley Friar, PA-C  clobetasol ointment (TEMOVATE) 3.87 % Apply 1 application topically daily as needed (rash). Patient not taking: Reported on 05/15/2020    [provider]  diclofenac Sodium (VOLTAREN) 1 % GEL Apply 2 g topically daily as needed (pain).  12/14/19   [provider]  HYDROcodone-acetaminophen (NORCO/VICODIN) 5-325 MG tablet Take 1 tablet by mouth every 12 (twelve) hours as needed for moderate pain. Patient not taking: Reported on 05/15/2020 08/23/19   Shirley Friar, PA-C  ivermectin (STROMECTOL) 3 MG TABS tablet Take by mouth.  Patient not taking: Reported on 05/15/2020 03/15/20   [provider]  KLOR-CON M20 20 MEQ tablet TAKE 3 TABLETS BY MOUTH EVERY DAY Patient taking differently: Take 20 mEq by mouth 2 (two) times daily.  04/10/20   Larey Dresser, MD  MAGNESIUM PO Take 1 tablet by mouth daily.    [provider]  Multiple Vitamin (MULTIVITAMIN WITH MINERALS) TABS tablet Take 1 tablet by mouth daily.     [provider]  Naproxen Sod-diphenhydrAMINE (ALEVE PM) 220-25 MG TABS Take 2 tablets by mouth at bedtime as needed (sleep/pain). Patient not taking: Reported on 05/15/2020    [provider]  Omega-3 Fatty Acids (FISH OIL PO) Take 1 capsule by mouth 4 (four) times a week.     [provider]  omeprazole (PRILOSEC) 20 MG capsule Take 1 capsule (20 mg total) by mouth daily for 14 days. 06/17/20 07/01/20  [place] Lanius A, PA-C  ondansetron (ZOFRAN ODT) 4 MG disintegrating tablet Take 1 tablet (4 mg total) by mouth every 8 (eight) hours as needed for nausea or vomiting. 06/17/20   Volanda Napoleon, PA-C  OVER THE COUNTER MEDICATION Apply 1 spray topically daily as needed (cramps). Topical Magnesium Spray Patient not taking: Reported on 05/15/2020    [provider]  Probiotic Product (PROBIOTIC PO) Take 1 capsule by mouth daily. Patient not taking:  Reported on 05/15/2020    [provider]  sacubitril-valsartan (ENTRESTO) 49-51 MG Take 1 tablet by mouth 2 (two) times daily. 03/23/20   Larey Dresser, MD  spironolactone (ALDACTONE) 25 MG tablet Take 1 tablet (25 mg total) by mouth daily. 05/18/20   Larey Dresser, MD  sucralfate (CARAFATE) 1 g tablet Take 1 tablet (1 g total) by mouth 4 (four) times daily -  with meals and at bedtime for 14 days. 06/17/20 07/01/20  Volanda Napoleon, PA-C  torsemide (DEMADEX) 20 MG tablet Take 3 tablets (60 mg total) by mouth daily. 04/23/20   Aundra Dubin,  Elby Showers, MD  XARELTO 20 MG TABS tablet TAKE ONE TABLET BY MOUTH DAILY WITH SUPPER Patient taking differently: Take 20 mg by mouth daily with supper.  10/19/19   Allred, Jeneen Rinks, MD  zinc gluconate 50 MG tablet Take 50 mg by mouth daily.    [provider]    Allergies    Metoprolol tartrate and Oxycodone  Review of Systems   Review of Systems  Constitutional: Negative for fever.  Respiratory: Negative for cough and shortness of breath.   Cardiovascular: Negative for chest pain.  Gastrointestinal: Positive for abdominal pain, nausea and vomiting. Negative for constipation and diarrhea.  Genitourinary: Negative for dysuria and hematuria.  Neurological: Negative for headaches.  All other systems reviewed and are negative.   Physical Exam Updated Vital Signs BP 115/69   Pulse 66   Temp 98.3 F (36.8 C) (Oral)   Resp 14   Ht 5\' 10"  (1.778 m)   Wt 119.7 kg   SpO2 97%   BMI 37.88 kg/m   Physical Exam Vitals and nursing note reviewed.  Constitutional:      Appearance: Normal appearance. He is well-developed.  HENT:     Head: Normocephalic and atraumatic.  Eyes:     General: Lids are normal.     Conjunctiva/sclera: Conjunctivae normal.     Pupils: Pupils are equal, round, and reactive to light.  Cardiovascular:     Rate and Rhythm: Normal rate and regular rhythm.     Pulses: Normal pulses.     Heart sounds: Normal heart  sounds. No murmur heard.  No friction rub. No gallop.   Pulmonary:     Effort: Pulmonary effort is normal.     Breath sounds: Normal breath sounds.     Comments: Lungs clear to auscultation bilaterally.  Symmetric chest rise.  No wheezing, rales, rhonchi. Abdominal:     Palpations: Abdomen is soft. Abdomen is not rigid.     Tenderness: There is abdominal tenderness in the right upper quadrant and epigastric area. There is no right CVA tenderness, left CVA tenderness or guarding. Positive signs include Murphy's sign.     Comments: Abdomen soft, nondistended.  Tenderness palpation in epigastric, right upper quadrant region.  No rigidity, guarding.  Positive Murphy sign.  No CVA tenderness noted bilaterally.  Musculoskeletal:        General: Normal range of motion.     Cervical back: Full passive range of motion without pain.  Skin:    General: Skin is warm and dry.     Capillary Refill: Capillary refill takes less than 2 seconds.  Neurological:     Mental Status: He is alert and oriented to person, place, and time.  Psychiatric:        Speech: Speech normal.     ED Results / Procedures / Treatments   Labs (all labs ordered are listed, but only abnormal results are displayed) Labs Reviewed  LIPASE, BLOOD - Abnormal; Notable for the following components:      Result Value   Lipase 83 (*)    All other components within normal limits  COMPREHENSIVE METABOLIC PANEL - Abnormal; Notable for the following components:   Sodium 132 (*)    Potassium 3.3 (*)    CO2 20 (*)    Glucose, Bld 121 (*)    Creatinine, Ser 1.27 (*)    All other components within normal limits  CBC - Abnormal; Notable for the following components:   WBC 14.6 (*)    All  other components within normal limits  URINALYSIS, ROUTINE W REFLEX MICROSCOPIC - Abnormal; Notable for the following components:   Ketones, ur 5 (*)    All other components within normal limits    EKG None  Radiology CT ABDOMEN PELVIS W  CONTRAST  Result Date: 06/17/2020 CLINICAL DATA:  Abdominal pain since Wednesday with nausea and vomiting, recent hip surgery 3 weeks ago with constipation EXAM: CT ABDOMEN AND PELVIS WITH CONTRAST TECHNIQUE: Multidetector CT imaging of the abdomen and pelvis was performed using the standard protocol following bolus administration of intravenous contrast. CONTRAST:  133mL OMNIPAQUE IOHEXOL 300 MG/ML  SOLN COMPARISON:  CT chest, abdomen and pelvis 02/19/2018 FINDINGS: Lower chest: Normal cardiac size. No pericardial effusion. Coronary artery calcifications are present. A pacer/defibrillator lead is directed towards the cardiac apex with additional leads in the right atrium and coronary sinus. Remote appearing postsurgical changes from prior sternotomy minimal scarring seen in the anterior mediastinum. Atelectatic changes in the otherwise clear lung bases. Hepatobiliary: Diffuse hepatic hypoattenuation compatible with hepatic steatosis. No focal liver lesion. Smooth liver surface contour. Normal gallbladder and biliary tree. Pancreas: No pancreatic ductal dilatation or surrounding inflammatory changes. Spleen: Normal in size. No concerning splenic lesions. Adrenals/Urinary Tract: Normal adrenal glands. Kidneys enhance and excrete symmetrically. Multitude of small fluid attenuation cysts and admixed a tiny subcentimeter hypoattenuating foci too small to fully characterize on CT imaging but statistically likely benign. Overall appearance is unchanged from prior. Stable cortical scarring and calcification in the upper pole left kidney. No concerning renal mass. No obstructive urolithiasis or hydronephrosis. Bladder is unremarkable. Stomach/Bowel: Distal esophagus, stomach and duodenal sweep are unremarkable. No small bowel wall thickening or dilatation. No evidence of obstruction. A normal appendix is visualized. No colonic dilatation or wall thickening. Vascular/Lymphatic: Atherosclerotic calcifications within the  abdominal aorta and branch vessels. No aneurysm or ectasia. No enlarged abdominopelvic lymph nodes. Reproductive: The prostate and seminal vesicles are unremarkable. Other: No abdominopelvic free fluid or free gas. No bowel containing hernias. Small fat containing umbilical hernia. Musculoskeletal: Prior total bilateral hip arthroplasties with the right acetabular component appearing screw fixed. No acute complication is evident though portions of the left lateral hip are collimated. Multilevel degenerative changes are present in the spine pelvis. Mild scoliotic curvature of the spine is similar to prior. No acute osseous abnormality or suspicious osseous lesion. IMPRESSION: 1. No acute intra-abdominal process to provide cause for patient's symptoms. 2. Hepatic steatosis. 3. Bilateral total hip arthroplasties without complication. 4. Aortic Atherosclerosis (ICD10-I70.0). Electronically Signed   By: Lovena Le M.D.   On: 06/17/2020 22:11   US Abdomen Limited RUQ (LIVER/GB)  Result Date: 06/17/2020 CLINICAL DATA:  Abdominal and epigastric pain EXAM: ULTRASOUND ABDOMEN LIMITED RIGHT UPPER QUADRANT COMPARISON:  02/19/2018 FINDINGS: Gallbladder: No gallstones or wall thickening visualized. No sonographic Murphy sign noted by sonographer. Common bile duct: Diameter: 4 mm Liver: No focal lesion identified. Within normal limits in parenchymal echogenicity. Portal vein is patent on color Doppler imaging with normal direction of blood flow towards the liver. Other: Stable 1.6 cm cyst upper pole right kidney incidentally noted. IMPRESSION: 1. Unremarkable right upper quadrant ultrasound. Electronically Signed   By: Randa Ngo M.D.   On: 06/17/2020 20:51    Procedures Procedures (including critical care time)  Medications Ordered in ED Medications  fentaNYL (SUBLIMAZE) injection 50 mcg (has no administration in time range)  sodium chloride 0.9 % bolus 1,000 mL (has no administration in time range)  sodium  chloride 0.9 % bolus 1,000  mL (1,000 mLs Intravenous New Bag/Given 06/17/20 2102)  ondansetron (ZOFRAN) injection 4 mg (4 mg Intravenous Given 06/17/20 2105)  fentaNYL (SUBLIMAZE) injection 50 mcg (50 mcg Intravenous Given 06/17/20 2116)  iohexol (OMNIPAQUE) 300 MG/ML solution 100 mL (100 mLs Intravenous Contrast Given 06/17/20 2137)    ED Course  I have reviewed the triage vital signs and the nursing notes.  Pertinent labs & imaging results that were available during my care of the patient were reviewed by me and considered in my medical decision making (see chart for details).    MDM Rules/Calculators/A&P                          69 year old male who presents for evaluation of abdominal pain, nausea/vomiting x5 days.  No fevers, urinary complaints.  Last bowel movement was yesterday.  Initially arrival, he is afebrile, appears uncomfortable but no acute distress.  He has some slightly soft blood pressures.  On exam, he has tenderness palpation in epigastric, right upper quadrant region.  Concern for hepatobiliary etiology versus infectious etiology.  Also consider viral GI process.  Plan to check labs.  CBC shows slight leukocytosis of 14.6.  Lipase is elevated 83.  CMP shows potassium of 3.3.  BUN is 21, creatinine 1.27.  Patient does not drink any alcohol.  Question of his slight elevation in lipase is due to gallstone etiology.  He is tender in the right upper quadrant.  We will plan for right upper quadrant ultrasound.  Right upper quadrant ultrasound reviewed.  Negative for any acute abnormalities.  Discussed results with patient.  He is still having some slight pain though reports improvement since pain medication.  We will get a CT scan for further evaluation.  CT scan shows no acute intra-abdominal process. Bilateral total hip arthroplasties without complication.   Reevaluation.  Patient states he has had improvement in pain.  Repeat abdominal exam shows improved tenderness.  He has not  any vomiting and he has been able to tolerate p.o. in the department.  He states he feels ready to go home.  We discussed that this could be some gastritis versus viral GI process.  We will plan to send him home with some nausea medication as well as a PPI, Carafate.  Additionally, patient provided outpatient GI referral if symptoms persist. At this time, patient exhibits no emergent life-threatening condition that require further evaluation in ED. Patient had ample opportunity for questions and discussion. All patient's questions were answered with full understanding. Strict return precautions discussed. Patient expresses understanding and agreement to plan.   Portions of this note were generated with Lobbyist. Dictation errors may occur despite best attempts at proofreading.   Final Clinical Impression(s) / ED Diagnoses Final diagnoses:  Abdominal pain  Non-intractable vomiting with nausea, unspecified vomiting type    Rx / DC Orders ED Discharge Orders         Ordered    ondansetron (ZOFRAN ODT) 4 MG disintegrating tablet  Every 8 hours PRN        06/17/20 2311    sucralfate (CARAFATE) 1 g tablet  3 times daily with meals & bedtime        06/17/20 2311    omeprazole (PRILOSEC) 20 MG capsule  Daily        06/17/20 2311           Volanda Napoleon, PA-C 06/17/20 2325    Margette Fast, MD 06/24/20 [phone]

## 2020-06-18 MED ORDER — ONDANSETRON 4 MG PO TBDP
4.0000 mg | ORAL_TABLET | Freq: Once | ORAL | Status: AC
  Administered 2020-06-18: 4 mg via ORAL
  Filled 2020-06-18: qty 1

## 2020-06-18 NOTE — ED Notes (Signed)
Fentanyl 61mcg wasted, witnessed with Charge RN Darl Householder. In sharps due to pt discharged out of pyixis.

## 2020-06-25 ENCOUNTER — Ambulatory Visit (INDEPENDENT_AMBULATORY_CARE_PROVIDER_SITE_OTHER): Payer: Medicare HMO

## 2020-06-25 DIAGNOSIS — I5022 Chronic systolic (congestive) heart failure: Secondary | ICD-10-CM

## 2020-06-25 DIAGNOSIS — Z95810 Presence of automatic (implantable) cardiac defibrillator: Secondary | ICD-10-CM

## 2020-06-27 DIAGNOSIS — R112 Nausea with vomiting, unspecified: Secondary | ICD-10-CM | POA: Diagnosis not present

## 2020-06-27 DIAGNOSIS — K5900 Constipation, unspecified: Secondary | ICD-10-CM | POA: Diagnosis not present

## 2020-06-27 NOTE — Progress Notes (Signed)
EPIC Encounter for ICM Monitoring  Patient Name: [name] is a 69 y.o. male Date: 06/27/2020 Primary Care Physican: Seward Carol, MD Primary Cardiologist:McLean Electrophysiologist:Allred Bi-V Pacing:>99% 04/27/2020 Weight:273lbs   AT/AF Burden: 100% (taking Xarelto)  Transmission reviewed.  Total Hip Surgery on 05/29/2020 and ER visit 12/5 for N&V.  CorVue thoracic impedance possible fluid accumulation 12/7 - 12/11 transmission date.   Prescribed:   Torsemide20 mg take3tablets (60 mg total) daily. He reports Dr Rayann Heman approved of him taking extra Torsemide when needed.  Klor Con 20 mEq take 1 tablet twice a day.   Spironolactone 25 mg take 1 tablet daily  Labs: 06/17/2020 Creatinine 1.27, BUN 21, Potassium 3.3, Sodium 132, GFR >60 05/18/2020 Creatinine 1.46, BUN 45, Potassium 4.6, Sodium 137, GFR 52  03/23/2020 Creatinine 1.44, BUN 33, Potassium 4.0, Sodium 138 03/06/2020 Creatinine 1.42, BUN 42, Potassium 4.3, Sodium 140, GFR 51-59 12/15/2019 Creatinine1.38, BUN46, Potassium4.3, Sodium138, GFR53-61 09/27/2019 Creatinine1.32, BUN36, Potassium4.7, Sodium140, GFR56->60  08/09/2019 Creatinine1.39, BUN38, Potassium4.1, Sodium139, GFR52->60 A complete set of results can be found in Results Review.  Recommendations: Recommendations will be given at office visit with [name] 06/28/2020  Follow-up plan: ICM clinic phone appointment on1/09/2020. 91 day device clinic remote transmission 08/22/2019.   EP/Cardiology Office Visits:06/28/2020 with Dr.McLean.  Copy of ICM check sent to Dr.Allred  3 month ICM trend: 06/23/2020    Rosalene Billings, RN 06/27/2020 12:07 PM

## 2020-06-28 ENCOUNTER — Ambulatory Visit (HOSPITAL_COMMUNITY)
Admission: RE | Admit: 2020-06-28 | Discharge: 2020-06-28 | Disposition: A | Discharge status: Home / Self Care | Payer: Medicare HMO | Source: Ambulatory Visit | Attending: Cardiology | Admitting: Cardiology

## 2020-06-28 ENCOUNTER — Other Ambulatory Visit: Payer: Self-pay

## 2020-06-28 ENCOUNTER — Encounter (HOSPITAL_COMMUNITY): Payer: Self-pay | Admitting: Cardiology

## 2020-06-28 VITALS — BP 120/68 | HR 64 | Wt 251.2 lb

## 2020-06-28 DIAGNOSIS — Z8249 Family history of ischemic heart disease and other diseases of the circulatory system: Secondary | ICD-10-CM | POA: Diagnosis not present

## 2020-06-28 DIAGNOSIS — G4733 Obstructive sleep apnea (adult) (pediatric): Secondary | ICD-10-CM | POA: Diagnosis not present

## 2020-06-28 DIAGNOSIS — I484 Atypical atrial flutter: Secondary | ICD-10-CM | POA: Diagnosis not present

## 2020-06-28 DIAGNOSIS — I428 Other cardiomyopathies: Secondary | ICD-10-CM | POA: Insufficient documentation

## 2020-06-28 DIAGNOSIS — R42 Dizziness and giddiness: Secondary | ICD-10-CM | POA: Insufficient documentation

## 2020-06-28 DIAGNOSIS — I719 Aortic aneurysm of unspecified site, without rupture: Secondary | ICD-10-CM | POA: Diagnosis not present

## 2020-06-28 DIAGNOSIS — Z7901 Long term (current) use of anticoagulants: Secondary | ICD-10-CM | POA: Diagnosis not present

## 2020-06-28 DIAGNOSIS — I482 Chronic atrial fibrillation, unspecified: Secondary | ICD-10-CM | POA: Insufficient documentation

## 2020-06-28 DIAGNOSIS — Z79899 Other long term (current) drug therapy: Secondary | ICD-10-CM | POA: Diagnosis not present

## 2020-06-28 DIAGNOSIS — I5022 Chronic systolic (congestive) heart failure: Secondary | ICD-10-CM | POA: Insufficient documentation

## 2020-06-28 DIAGNOSIS — I110 Hypertensive heart disease with heart failure: Secondary | ICD-10-CM | POA: Insufficient documentation

## 2020-06-28 LAB — BASIC METABOLIC PANEL
Anion gap: 12 (ref 5–15)
BUN: 22 mg/dL (ref 8–23)
CO2: 25 mmol/L (ref 22–32)
Calcium: 9.3 mg/dL (ref 8.9–10.3)
Chloride: 101 mmol/L (ref 98–111)
Creatinine, Ser: 1.25 mg/dL — ABNORMAL HIGH (ref 0.61–1.24)
GFR, Estimated: >60 mL/min (ref >60)
Glucose, Bld: 112 mg/dL — ABNORMAL HIGH (ref 70–99)
Potassium: 3.7 mmol/L (ref 3.5–5.1)
Sodium: 138 mmol/L (ref 135–145)

## 2020-06-28 LAB — CBC
HCT: 35.9 % — ABNORMAL LOW (ref 39.0–52.0)
Hemoglobin: 12.3 g/dL — ABNORMAL LOW (ref 13.0–17.0)
MCH: 29.1 pg (ref 26.0–34.0)
MCHC: 34.3 g/dL (ref 30.0–36.0)
MCV: 85.1 fL (ref 80.0–100.0)
Platelets: 186 10*3/uL (ref 150–400)
RBC: 4.22 MIL/uL (ref 4.22–5.81)
RDW: 13.6 % (ref 11.5–15.5)
WBC: 11.2 10*3/uL — ABNORMAL HIGH (ref 4.0–10.5)
nRBC: 0 % (ref 0.0–0.2)

## 2020-06-28 MED ORDER — ENTRESTO 24-26 MG PO TABS: 1.0000 | ORAL_TABLET | Freq: Two times a day (BID) | ORAL | 3 refills | Status: DC

## 2020-06-28 MED ORDER — TORSEMIDE 20 MG PO TABS
40.0000 mg | ORAL_TABLET | Freq: Every day | ORAL | 2 refills | Status: DC
Start: 2020-06-28 — End: 2021-06-26

## 2020-06-28 NOTE — Patient Instructions (Signed)
Decrease Torsemide to 40 mg (2 tab) Daily  Decrease Entresto to 24/26 mg Twice daily   Labs done today, your results will be available in MyChart, we will contact you for abnormal readings.  Non-Cardiac CT Angiography (CTA), is a special type of CT scan that uses a computer to produce multi-dimensional views of major blood vessels throughout the body. In CT angiography, a contrast material is injected through an IV to help visualize the blood vessels. Once approved by your insurance company we will call you to schedule this.  Please call our office in May 2022 to schedule your follow up appointment  If you have any questions or concerns before your next appointment please send [place] a message through [facility] or call our office at [phone].    TO LEAVE A MESSAGE FOR THE NURSE SELECT OPTION 2, PLEASE LEAVE A MESSAGE INCLUDING: . YOUR NAME . DATE OF BIRTH . CALL BACK NUMBER . REASON FOR CALL**this is important as we prioritize the call backs  [facility] AS LONG AS YOU CALL BEFORE 4:00 PM  At the [facility], you and your health needs are our priority. As part of our continuing mission to provide you with exceptional heart care, we have created designated Provider Care Teams. These Care Teams include your primary Cardiologist (physician) and Advanced Practice Providers (APPs- Physician Assistants and Nurse Practitioners) who all work together to provide you with the care you need, when you need it.   You may see any of the following providers on your designated Care Team at your next follow up: Marland Kitchen Dr Glori Bickers . Dr [name] . Darrick Grinder, NP . Lyda Jester, PA . Audry Riles, PharmD   Please be sure to bring in all your medications bottles to every appointment.

## 2020-06-28 NOTE — Progress Notes (Signed)
PCP: Seward Carol, MD EP: Dr. Rayann Heman Cardiology: Dr. Gwenlyn Found HF Cardiology: Dr. Aundra Dubin  69 y.o. with history of chronic atrial fibrillation and flutter with failed Maze procedure, valvular heart disease s/p mitral and aortic valve repairs, and chronic systolic CHF was referred by Dr. Rayann Heman for CHF evaluation.  Patient has a long history of atrial fibrillation.  He was initially controlled by Tikosyn but it eventually became chronic.  He had significant mitral and aortic regurgitation, and in 11/19, he had mitral and aortic valve repairs with Maze and LA appendage ligation. Cath prior to valve surgery in 10/19 showed no significant coronary disease.  It appears from ECGs that he never went back into NSR.  He has had slow atrial fibrillation with rate in upper 30s-40s at times, so was never started on amiodarone.  Last echo in 4/20 showed EF 40-45% with stable aortic and mitral valve repairs.  He has developed worsening volume overload, and Lasix has been titrated up to 80 mg bid.  Most recently, he went into atypical atrial flutter with HR consistently in the 110s range.  He therefore was taken for DCCV 03/10/19.  Initially rhythm looked junctional in the 40s-50s, but he subsequently went back into rapid atypical atrial flutter with elevated rate (>100 bpm).   Echo was done in 9/20, showing EF worse at 20-25%.     He saw Dr. Rayann Heman and had atrial flutter + atrial fibrillation ablation in 10/20.  Post-procedure, he was noted to be in slow atrial fibrillation (rate 40s) at time of his atrial fibrillation clinic followup.   Patient had St Jude CRT-D implantation in 2/21.  In 6/21, he had AV nodal ablation to allow CRT given recurrent atrial flutter/fibrillation. Echo in 10/21 showed EF up to 60-65%.   He returns today for followup of CHF.  He is doing well.  Had uncomplicated right THR in 11/21.  No dyspnea walking on flat ground, mild dyspnea walking up a hill.  SBP gets down to the 90s at times with  lightheadedness.  Weight is down 22 lbs.    ECG (personally reviewed): Atypical flutter with BiV pacing  Labs (8/20): K 3.9, creatinine 1.14, BNP 473, hgb 13.3 Labs (9/20): K 3.3 => 3.7, creatinine 1.35 => 1.3 Labs (12/20): K 4.1, creatinine 1.2 Labs (1/21): K 4.1, creatinine 1.39 Labs (8/21): K 4.3, creatinine 1.42, plts 136 Labs (12/21): K 3.3, creatinine 1.27  PMH: 1. HTN 2. Paroxysmal atrial fibrillation/flutter: Failed Tikosyn.  Had Maze procedure 11/19 but does not appear to have ever gone back into NSR.  Had been in atrial fibrillation with bradycardia with rate in 30s-40s at times, now in atypical atrial flutter primarily with rate 100s-110s.  He failed DCCV 8/20.  No amiodarone due to h/o bradycardia.  - Atrial fibrillation + atrial flutter ablation in 10/20.  3. OSA: Uses CPAP.  4. Chronic systolic CHF: Nonischemic cardiomyopathy.  - 10/19 LHC: No coronary disease.  - Echo (4/20): EF 40-45%, mild LVH, moderate dilated RV with mildly decreased systolic function, s/p MV repair with no significant stenosis or regurgitation, s/p aortic valve repair with mild AI, mild aortic stenosis.  Dilated IVC.  - Echo (9/20): EF 20-25%, mild LVH with moderate LV dilation, s/p MV repair with mean gradient 6, s/p AoV repair with mean gradient 8. No significant MR or AI.  - St Jude CRT-D implantation in 2/21.  - AV nodal ablation 6/21.  - Echo (10/21): EF 60-65%, mild LV enlargement and LVH, moderate RV enlargement with  normal function, severe LVE, s/p MV repair with no MR and mean gradient 3, s/p aortic valve repair with mean gradient 9 and no AI, ascending aorta 4.6 cm.  5. Valvular heart disease: Patient has history of MR and AI.  In 11/19, he had mitral valve repair, aortic valve repair, surgical Maze, and LA appendage excision.  6. LBBB 7. Ascending aorta aneurysm: 4.6 cm on 10/21 echo.   Social History   Socioeconomic History  . Marital status: Married    Spouse name: Not on file  .  Number of children: Not on file  . Years of education: Not on file  . Highest education level: Not on file  Occupational History  . Occupation: Agricultural consultant  Tobacco Use  . Smoking status: Never Smoker  . Smokeless tobacco: Never Used  Vaping Use  . Vaping Use: Never used  Substance and Sexual Activity  . Alcohol use: No  . Drug use: No  . Sexual activity: Yes  Other Topics Concern  . Not on file  Social History Narrative   Pt lives in [place] with spouse.  Leadership training and behavioral safety training.   Social Determinants of Health   Financial Resource Strain: Not on file  Food Insecurity: Not on file  Transportation Needs: No Transportation Needs  . Lack of Transportation (Medical): No  . Lack of Transportation (Non-Medical): No  Physical Activity: Not on file  Stress: Not on file  Social Connections: Not on file  Intimate Partner Violence: Not on file   Family History  Problem Relation Age of Onset  . Other Other        mother had a pacemaker  . Hypertension Father    ROS: All systems reviewed and negative except as per HPI.   Current Outpatient Medications  Medication Sig Dispense Refill  . acetaminophen (TYLENOL) 325 MG tablet Take 2 tablets (650 mg total) by mouth every 4 (four) hours as needed for headache or mild pain.    . Ascorbic Acid (VITAMIN C) 1000 MG tablet Take 1,000 mg by mouth daily.    . carvedilol (COREG) 6.25 MG tablet Take 1 tablet (6.25 mg total) by mouth 2 (two) times daily. 180 tablet 3  . clobetasol ointment (TEMOVATE) 4.31 % Apply 1 application topically daily as needed (rash).    . diclofenac Sodium (VOLTAREN) 1 % GEL Apply 2 g topically daily as needed (pain).     Marland Kitchen ivermectin (STROMECTOL) 3 MG TABS tablet Take by mouth.    Marland Kitchen MAGNESIUM PO Take 1 tablet by mouth daily.    . Multiple Vitamin (MULTIVITAMIN WITH MINERALS) TABS tablet Take 1 tablet by mouth daily.     . Naproxen Sod-diphenhydrAMINE 220-25 MG  TABS Take 2 tablets by mouth at bedtime as needed (sleep/pain).    . Omega-3 Fatty Acids (FISH OIL PO) Take 1 capsule by mouth 4 (four) times a week.     . potassium chloride SA (KLOR-CON) 20 MEQ tablet Take 20 mEq by mouth 2 (two) times daily.    . Probiotic Product (PROBIOTIC PO) Take 1 capsule by mouth daily.    Marland Kitchen spironolactone (ALDACTONE) 25 MG tablet Take 1 tablet (25 mg total) by mouth daily. 90 tablet 3  . XARELTO 20 MG TABS tablet TAKE ONE TABLET BY MOUTH DAILY WITH SUPPER (Patient taking differently: Take 20 mg by mouth daily with supper.) 90 tablet 2  . zinc gluconate 50 MG tablet Take 50 mg by mouth daily.    Marland Kitchen  sacubitril-valsartan (ENTRESTO) 24-26 MG Take 1 tablet by mouth 2 (two) times daily. 60 tablet 3  . torsemide (DEMADEX) 20 MG tablet Take 2 tablets (40 mg total) by mouth daily. 180 tablet 2   No current facility-administered medications for this encounter.   BP 120/68   Pulse 64   Wt 113.9 kg (251 lb 3.2 oz)   SpO2 97%   BMI 36.04 kg/m  General: NAD Neck: No JVD, no thyromegaly or thyroid nodule.  Lungs: Clear to auscultation bilaterally with normal respiratory effort. CV: Nondisplaced PMI.  Heart regular S1/S2, no S3/S4, no murmur.  No peripheral edema.  No carotid bruit.  Normal pedal pulses.  Abdomen: Soft, nontender, no hepatosplenomegaly, no distention.  Skin: Intact without lesions or rashes.  Neurologic: Alert and oriented x 3.  Psych: Normal affect. Extremities: No clubbing or cyanosis.  HEENT: Normal.   Assessment/Plan: 1. Chronic systolic CHF: Echo in 6/50 with EF 40-45%, moderate RV dilation/mildly decreased RV function.  Nonischemic cardiomyopathy, cath in 10/19 without significant coronary disease.  Repeat echo in 9/20 showed fall in EF to 20-25% in the setting of atrial fibrillation and atrial flutter with persistent tachycardia.  He is now s/p St Jude CRT-D and AV nodal ablation.  Echo in 10/21 with EF up to 60-65%.  NYHA class II symptoms, no volume  overload on exam.  BP runs low with lightheadedness at times, he has lost weight.  - Decrease torsemide to 40 mg daily.  BMET today.  - Continue spironolactone 25 mg daily.   - Decrease Entresto to 24/26 bid.    - Continue Coreg 6.25 mg bid.  2. Atrial arrhythmias: Long history of chronic atrial fibrillation, has had slow ventricular response in the past (HR 40s at times). He failed Tikosyn and have avoided amiodarone with bradycardia.  He had Maze procedure in 11/19 but it was not successful.  At initial appointment with me, he was in atypical flutter with HR 110s.  He failed DCCV in 8/20.  He had flutter/fibrillation ablation in 10/20.  Initially after ablation, he was in slow atrial fibrillation (rate upper 30s-50s generally).  He then went into atypical atrial flutter with rate 100s.  He is now s/p AV nodal ablation with CRT-D.  His underlying rhythm appears to be atypical atrial flutter.  - He will continue Xarelto.  CBC today.  3. OSA: Continue CPAP.  4. Valvular heart disease: S/p MV and AoV repairs in 11/19.  Valves looked stable on last echo in 10/21.  - He will need antibiotic prophylaxis with dental work.  5. Ascending aortic aneurysm: 4.6 cm on 10/21 echo.  - I will get CTA chest to evaluate the full thoracic aorta.   Followup in 6 months.   [name] 06/28/2020

## 2020-07-06 ENCOUNTER — Other Ambulatory Visit: Payer: Self-pay | Admitting: Internal Medicine

## 2020-07-06 DIAGNOSIS — Z8679 Personal history of other diseases of the circulatory system: Secondary | ICD-10-CM

## 2020-07-06 DIAGNOSIS — I428 Other cardiomyopathies: Secondary | ICD-10-CM

## 2020-07-06 DIAGNOSIS — I5022 Chronic systolic (congestive) heart failure: Secondary | ICD-10-CM

## 2020-07-06 DIAGNOSIS — I484 Atypical atrial flutter: Secondary | ICD-10-CM

## 2020-07-06 DIAGNOSIS — G4733 Obstructive sleep apnea (adult) (pediatric): Secondary | ICD-10-CM

## 2020-07-06 DIAGNOSIS — I447 Left bundle-branch block, unspecified: Secondary | ICD-10-CM

## 2020-07-06 DIAGNOSIS — I4819 Other persistent atrial fibrillation: Secondary | ICD-10-CM

## 2020-07-16 ENCOUNTER — Ambulatory Visit (INDEPENDENT_AMBULATORY_CARE_PROVIDER_SITE_OTHER): Payer: Medicare HMO

## 2020-07-16 DIAGNOSIS — I5022 Chronic systolic (congestive) heart failure: Secondary | ICD-10-CM

## 2020-07-16 DIAGNOSIS — Z95810 Presence of automatic (implantable) cardiac defibrillator: Secondary | ICD-10-CM | POA: Diagnosis not present

## 2020-07-16 NOTE — Progress Notes (Signed)
EPIC Encounter for ICM Monitoring  Patient Name: [name] is a 70 y.o. male Date: 07/16/2020 Primary Care Physican: Renford Dills, MD Primary Cardiologist:McLean Electrophysiologist:Allred Bi-V Pacing:>99% 07/16/2020 Weight:254lbs   AT/AF Burden: 100% (taking Xarelto)  Spoke with patient.  He reports he and his wife think they have COVID but he has recovered.  His wife is still trying to recover.  He has been drinking too many fluids and doubled the Torsemide when he gained 6 pounds overnight.  He is back to taking prescribed Torsemide dosage and limiting fluid intake.  CorVue thoracic impedance suggestingpossible fluid accumulation starting 07/14/2020 and also showed decreased impedance 12/6-12/23.  Prescribed:   Torsemide20 mg take2tablets (40 mg total) daily (dosage decreased 06/26/20). He reports Dr Shirlee Latch approved of him taking extra Torsemide when needed at 06/28/2020 OV.  Klor Con 20 mEq take1tablet twice a day.  Spironolactone 25 mg take 1 tablet daily  Labs: 06/17/2020 Creatinine 1.27, BUN 21, Potassium 3.3, Sodium 132, GFR >60 05/18/2020 Creatinine 1.46, BUN 45, Potassium 4.6, Sodium 137, GFR 52  03/23/2020 Creatinine 1.44, BUN 33, Potassium 4.0, Sodium 138 03/06/2020 Creatinine 1.42, BUN 42, Potassium 4.3, Sodium 140, GFR 51-59 12/15/2019 Creatinine1.38, BUN46, Potassium4.3, Sodium138, GFR53-61 09/27/2019 Creatinine1.32, BUN36, Potassium4.7, Sodium140, GFR56->60  08/09/2019 Creatinine1.39, BUN38, Potassium4.1, Sodium139, GFR52->60 A complete set of results can be found in Results Review.  Recommendations: He self adjusts Torsemide as needed based on weight and symptoms.  Follow-up plan: ICM clinic phone appointment on1/05/2021 to recheck fluid levels.91 day device clinic remote transmission2/02/2020.   EP/Cardiology Office Visits: Recall 10/24/2020 for Dr  Johney Frame.  Recall for 12/25/2020 with Dr.McLean.  Copy of ICM check sent to Dr.Allred  3 month ICM trend: 07/16/2020.    1 Year ICM trend:       Karie Soda, RN 07/16/2020 4:38 PM

## 2020-07-17 ENCOUNTER — Ambulatory Visit (HOSPITAL_COMMUNITY): Payer: Medicare HMO

## 2020-07-17 ENCOUNTER — Other Ambulatory Visit: Payer: Self-pay | Admitting: Internal Medicine

## 2020-07-17 NOTE — Telephone Encounter (Signed)
Prescription refill request for Xarelto received.   Indication: A flutter Last office visit: 04/27/2020, [place] Weight: 113.9 kg  Age:70 yo Scr: 1.25, 06/28/2020 CrCl: 92 ml/min   Prescription refill sent.

## 2020-07-23 ENCOUNTER — Ambulatory Visit: Payer: Medicare HMO | Admitting: Thoracic Surgery (Cardiothoracic Vascular Surgery)

## 2020-07-23 DIAGNOSIS — M1611 Unilateral primary osteoarthritis, right hip: Secondary | ICD-10-CM | POA: Diagnosis not present

## 2020-07-24 ENCOUNTER — Ambulatory Visit (INDEPENDENT_AMBULATORY_CARE_PROVIDER_SITE_OTHER): Payer: Medicare HMO

## 2020-07-24 DIAGNOSIS — Z95810 Presence of automatic (implantable) cardiac defibrillator: Secondary | ICD-10-CM

## 2020-07-24 DIAGNOSIS — I5022 Chronic systolic (congestive) heart failure: Secondary | ICD-10-CM

## 2020-07-25 NOTE — Progress Notes (Signed)
EPIC Encounter for ICM Monitoring  Patient Name: [name] is a 70 y.o. male Date: 07/25/2020 Primary Care Physican: Seward Carol, MD Primary Cardiologist:McLean Electrophysiologist:Allred Bi-V Pacing:>99% 07/25/2020 Weight:252-256lbs   AT/AF Burden: 100% (taking Xarelto)  Spoke with patient.  He reports he and his wife  have recovered from [place].  CorVue thoracic impedance suggesting fluid levels have returned to normal after adjusting Torsemide for 2-3 days.  Prescribed:   Torsemide20 mg take2tablets (40 mg total) daily (dosage decreased 06/26/20). He reports [name] approved of him taking extra Torsemide when needed at 06/28/2020 OV.  Klor Con 20 mEq take1tablet twice a day.  Spironolactone 25 mg take 1 tablet daily  Labs: 06/17/2020 Creatinine1.27, BUN21, Potassium3.3, [id number], GFR>60 05/18/2020 Creatinine1.46, BUN45, Potassium4.6, Sodium137, GFR52  03/23/2020 Creatinine1.44, BUN33, Potassium4.0, [id number] 03/06/2020 Creatinine 1.42, BUN 42, Potassium 4.3, Sodium 140, GFR 51-59 12/15/2019 Creatinine1.38, BUN46, Potassium4.3, Sodium138, GFR53-61 09/27/2019 Creatinine1.32, BUN36, Potassium4.7, Sodium140, GFR56->60  08/09/2019 Creatinine1.39, BUN38, Potassium4.1, Sodium139, GFR52->60 A complete set of results can be found in Results Review.  Recommendations: No changes and encouraged to call if experiencing any fluid symptoms.  Follow-up plan: ICM clinic phone appointment on2/21/2022 to recheck fluid levels.91 day device clinic remote transmission2/02/2020.   EP/Cardiology Office Visits: Recall 10/24/2020 for Dr Rayann Heman.  Recall for 12/25/2020 with Dr.McLean.  Copy of ICM check sent to Dr.Allred  3 month ICM trend: 07/24/2020.    1 Year ICM trend:       Rosalene Billings, RN 07/25/2020 3:56 PM

## 2020-08-01 ENCOUNTER — Ambulatory Visit (HOSPITAL_COMMUNITY): Payer: Medicare HMO

## 2020-08-06 ENCOUNTER — Other Ambulatory Visit: Payer: Self-pay

## 2020-08-06 ENCOUNTER — Ambulatory Visit: Payer: Medicare HMO | Admitting: Thoracic Surgery (Cardiothoracic Vascular Surgery)

## 2020-08-06 ENCOUNTER — Encounter: Payer: Self-pay | Admitting: Thoracic Surgery (Cardiothoracic Vascular Surgery)

## 2020-08-06 VITALS — BP 127/73 | HR 72 | Resp 20 | Ht 70.0 in | Wt 262.0 lb

## 2020-08-06 DIAGNOSIS — Z8679 Personal history of other diseases of the circulatory system: Secondary | ICD-10-CM

## 2020-08-06 DIAGNOSIS — Z9889 Other specified postprocedural states: Secondary | ICD-10-CM | POA: Diagnosis not present

## 2020-08-06 NOTE — Patient Instructions (Signed)
Continue all previous medications without any changes at this time  

## 2020-08-06 NOTE — Progress Notes (Signed)
[address]       [place] [address]             [phone]     CARDIOTHORACIC SURGERY OFFICE NOTE  Referring Provider is Thompson Grayer, MD Primary Cardiologist is Quay Burow, MD  Advanced Heart Failure Cardiologist is Larey Dresser, MD PCP is Seward Carol, MD   HPI:  Patient is a 70 year old morbidly obese male with history ofaortic insufficiency, mitral regurgitation,long-standingpersistent atrial fibrillation,hypertension,chronic diastolic congestive heart failure, and obstructive sleep apnea whoreturns to the office today for follow-up of aortic valve repair, mitral valve repair, and Maze procedure on May 18, 2018.He developed persistent atypical atrial flutter following Maze procedure which subsequently failed an attempt at catheter-based ablation.  Associated with this he developed tachycardia induced nonischemic cardiomyopathy and he was last seen here in our office on July 18, 2019.  He eventually underwent biventricular ICD placement last February.  Since then he has been doing much better and he has been followed carefully by Dr. Aundra Dubin.  Follow-up echocardiogram performed April 20, 2020 revealed normalized left ventricular systolic function with ejection fraction estimated 60 to 65%.  Aortic and mitral valve repairs both remained intact with trivial aortic insufficiency and no reported mitral regurgitation.  Mean transvalvular gradient across the aortic valve was estimated 9 mmHg while mean gradient across mitral valve was estimated 3 mmHg.  Patient returns her office today and reports that he is doing very well.  He recently underwent right total hip replacement and recovered uneventfully.  He states that overall he feels dramatically better than he did prior to his heart surgery in 2019 and he is doing much better than he was 1 year ago when I last saw him.  He states he only gets short of breath with more strenuous exertion and his  activities are only "slightly limited".  Overall his biggest limitation now is his mobility.  Current Outpatient Medications  Medication Sig Dispense Refill  . acetaminophen (TYLENOL) 325 MG tablet Take 2 tablets (650 mg total) by mouth every 4 (four) hours as needed for headache or mild pain.    . Ascorbic Acid (VITAMIN C) 1000 MG tablet Take 1,000 mg by mouth daily.    . carvedilol (COREG) 6.25 MG tablet Take 1 tablet (6.25 mg total) by mouth 2 (two) times daily. 180 tablet 3  . clobetasol ointment (TEMOVATE) [id number] % Apply 1 application topically daily as needed (rash).    . diclofenac Sodium (VOLTAREN) 1 % GEL Apply 2 g topically daily as needed (pain).     Marland Kitchen ivermectin (STROMECTOL) 3 MG TABS tablet Take by mouth.    Marland Kitchen MAGNESIUM PO Take 1 tablet by mouth daily.    . Multiple Vitamin (MULTIVITAMIN WITH MINERALS) TABS tablet Take 1 tablet by mouth daily.     . Naproxen Sod-diphenhydrAMINE 220-25 MG TABS Take 2 tablets by mouth at bedtime as needed (sleep/pain).    . Omega-3 Fatty Acids (FISH OIL PO) Take 1 capsule by mouth 4 (four) times a week.     . potassium chloride SA (KLOR-CON) 20 MEQ tablet Take 20 mEq by mouth 2 (two) times daily.    . Probiotic Product (PROBIOTIC PO) Take 1 capsule by mouth daily.    . sacubitril-valsartan (ENTRESTO) 24-26 MG Take 1 tablet by mouth 2 (two) times daily. 60 tablet 3  . spironolactone (ALDACTONE) 25 MG tablet Take 1 tablet (25 mg total) by mouth daily. 90 tablet 3  . torsemide (  DEMADEX) 20 MG tablet Take 2 tablets (40 mg total) by mouth daily. 180 tablet 2  . XARELTO 20 MG TABS tablet TAKE ONE TABLET BY MOUTH DAILY WITH SUPPER 30 tablet 5  . zinc gluconate 50 MG tablet Take 50 mg by mouth daily.     No current facility-administered medications for this visit.      Physical Exam:   BP 127/73 (BP Location: Right Arm, Patient Position: Sitting)   Pulse 72   Resp 20   Ht 5\' 10"  (1.778 m)   Wt 262 lb (118.8 kg)   SpO2 94% Comment: RA with mask on   BMI 37.59 kg/m   General:  Obese but well-appearing  Chest:   Clear to auscultation  CV:   Regular rate and rhythm without murmur  Incisions:  n/a  Abdomen:  Soft nontender  Extremities:  Warm and well-perfused, no edema  Diagnostic Tests:  ECHOCARDIOGRAM REPORT       Patient Name:  [name] Date of Exam: 04/20/2020  Medical Rec #: [id number]    Height:    70.0 in  Accession #:  [id number]   Weight:    273.0 lb  Date of Birth: [birth year]    BSA:     2.382 m  Patient Age:  60 years    BP:      110/68 mmHg  Patient Gender: M        HR:      60 bpm.  Exam Location: [facility]   Procedure: 2D Echo, 3D Echo, Cardiac Doppler and Color Doppler   Indications:  I48.19 Atrial Fibrillation    History:    Patient has prior history of Echocardiogram examinations,  most         recent 04/01/2019. Defibrillator, Arrythmias:Atrial  Fibrillation,         Atrial Flutter and LBBB; Risk Factors:Hypertension. Non  Ischemic         Cardiomyopathy, Obesity,         (2019) Mitral valve Repair (7mm 4D memo ring)         MAZE procedure         Aortic Valve Repair (40mm HAART ring).    Sonographer:  Deliah Boston RDCS  Referring Phys: [place]    1. Left ventricular ejection fraction, by estimation, is 60 to 65%. The  left ventricle has normal function. The left ventricle has no regional  wall motion abnormalities. The left ventricular internal cavity size was  mildly dilated. There is mild left  ventricular hypertrophy. Left ventricular diastolic parameters are  indeterminate.  2. Right ventricular systolic function is normal. The right ventricular  size is moderately enlarged. There is normal pulmonary artery systolic  pressure. The estimated right ventricular systolic pressure is 44.0 mmHg.  3. Left atrial size was severely dilated.  4.  Right atrial size was mildly dilated.  5. The mitral valve has been repaired/replaced. There is a prosthetic  annuloplasty ring present in the mitral position. No evidence of mitral  valve regurgitation. Mean gradient 3 mmHg at 60bpm.  6. The aortic valve has been repaired/replaced. There is a prosthetic  annuloplasty ring present in the aortic position. Aortic valve  regurgitation is trivial. Aortic valve mean gradient measures 9.0 mmHg.  7. The inferior vena cava is normal in size with greater than 50%  respiratory variability, suggesting right atrial pressure of 3 mmHg.  8. Aortic dilatation noted. Aneurysm of the ascending aorta, measuring 46  mm.  Comparison(s): LV systolic function is markedly improved compared to prior  echo on 04/01/19.   FINDINGS  Left Ventricle: Left ventricular ejection fraction, by estimation, is 60  to 65%. The left ventricle has normal function. The left ventricle has no  regional wall motion abnormalities. The left ventricular internal cavity  size was mildly dilated. There is  mild left ventricular hypertrophy. Left ventricular diastolic parameters  are indeterminate.   Right Ventricle: The right ventricular size is moderately enlarged. Right  vetricular wall thickness was not assessed. Right ventricular systolic  function is normal. There is normal pulmonary artery systolic pressure.  The tricuspid regurgitant velocity is  2.55 m/s, and with an assumed right atrial pressure of 3 mmHg, the  estimated right ventricular systolic pressure is 0000000 mmHg.   Left Atrium: Left atrial size was severely dilated.   Right Atrium: Right atrial size was mildly dilated.   Pericardium: There is no evidence of pericardial effusion.   Mitral Valve: The mitral valve has been repaired/replaced. No evidence of  mitral valve regurgitation. There is a prosthetic annuloplasty ring  present in the mitral position.   Tricuspid Valve: The tricuspid valve is normal  in structure. Tricuspid  valve regurgitation is trivial.   Aortic Valve: The aortic valve has been repaired/replaced. Aortic valve  regurgitation is trivial. Aortic valve mean gradient measures 9.0 mmHg.  Aortic valve peak gradient measures 17.6 mmHg. Aortic valve area, by VTI  measures 2.26 cm.   Pulmonic Valve: The pulmonic valve was not well visualized. Pulmonic valve  regurgitation is not visualized.   Aorta: The aortic root is normal in size and structure and aortic  dilatation noted. There is an aneurysm involving the ascending aorta  measuring 46 mm.   Venous: The inferior vena cava is normal in size with greater than 50%  respiratory variability, suggesting right atrial pressure of 3 mmHg.   IAS/Shunts: The interatrial septum was not well visualized.     LEFT VENTRICLE  PLAX 2D  LVIDd:     6.00 cm Diastology  LVIDs:     3.70 cm LV e' medial:  6.20 cm/s  LV PW:     1.40 cm LV E/e' medial: 23.8  LV IVS:    1.00 cm LV e' lateral:  9.03 cm/s  LVOT diam:   2.40 cm LV E/e' lateral: 16.3  LV SV:     90  LV SV Index:  38  LVOT Area:   4.52 cm               3D Volume EF:             3D EF:    55 %             LV EDV:    239 ml             LV ESV:    107 ml             LV SV:    133 ml   RIGHT VENTRICLE  RV S prime:   11.20 cm/s  TAPSE (M-mode): 1.4 cm   LEFT ATRIUM       Index    RIGHT ATRIUM      Index  LA diam:    6.00 cm 2.52 cm/m RA Area:   25.90 cm  LA Vol (A2C):  113.0 ml 47.43 ml/m RA Volume:  83.70 ml 35.13 ml/m  LA Vol (A4C):  121.0 ml 50.79 ml/m  LA Biplane Vol:  118.0 ml 49.53 ml/m  AORTIC VALVE  AV Area (Vmax):  2.15 cm  AV Area (Vmean):  1.96 cm  AV Area (VTI):   2.26 cm  AV Vmax:      210.00 cm/s  AV Vmean:     142.000 cm/s  AV VTI:      0.398 m  AV Peak Grad:   17.6 mmHg   AV Mean Grad:   9.0 mmHg  LVOT Vmax:     99.80 cm/s  LVOT Vmean:    61.500 cm/s  LVOT VTI:     0.199 m  LVOT/AV VTI ratio: 0.50    AORTA  Ao Root diam: 3.70 cm  Ao Asc diam: 4.45 cm   MITRAL VALVE        TRICUSPID VALVE  MV Area (PHT): cm     TR Peak grad:  26.0 mmHg  MV Decel Time: 335 msec   TR Vmax:    255.00 cm/s  MV E velocity: 147.50 cm/s  MV A velocity: 65.55 cm/s  SHUNTS  MV E/A ratio: 2.25     Systemic VTI: 0.20 m               Systemic Diam: 2.40 cm   Oswaldo Milian MD  Electronically signed by Oswaldo Milian MD  Signature Date/Time: 04/20/2020/6:37:15 PM      Impression:  Patient is currently doing well more than 2 years status post aortic valve repair, mitral valve repair, and Maze procedure.  I have personally reviewed the patient's most recent follow-up echocardiogram which demonstrates normal left ventricular function with intact aortic and mitral valve repairs.  Plan:  I would favor annual follow-up echocardiograms keep an eye on long-term outcome following aortic valve repair and mitral valve repair.  The patient will continue to follow-up regularly with Dr. Aundra Dubin and Dr. Gwenlyn Found.  We will plan to check in on him once a year to make sure that his aortic valve repair remains intact long-term.  All of his questions have been answered.    I spent in excess of 15 minutes during the conduct of this office consultation and >50% of this time involved direct face-to-face encounter with the patient for counseling and/or coordination of their care.    Valentina Gu. Roxy Manns, MD 08/06/2020 2:27 PM

## 2020-08-07 ENCOUNTER — Ambulatory Visit (HOSPITAL_COMMUNITY): Payer: Medicare HMO

## 2020-08-21 ENCOUNTER — Ambulatory Visit (INDEPENDENT_AMBULATORY_CARE_PROVIDER_SITE_OTHER): Payer: Medicare HMO

## 2020-08-21 DIAGNOSIS — I428 Other cardiomyopathies: Secondary | ICD-10-CM | POA: Diagnosis not present

## 2020-08-22 ENCOUNTER — Ambulatory Visit (INDEPENDENT_AMBULATORY_CARE_PROVIDER_SITE_OTHER): Payer: Medicare HMO

## 2020-08-22 DIAGNOSIS — Z95810 Presence of automatic (implantable) cardiac defibrillator: Secondary | ICD-10-CM

## 2020-08-22 DIAGNOSIS — I5022 Chronic systolic (congestive) heart failure: Secondary | ICD-10-CM

## 2020-08-29 NOTE — Progress Notes (Signed)
EPIC Encounter for ICM Monitoring  Patient Name: [name] is a 70 y.o. male Date: 08/29/2020 Primary Care Physican: Seward Carol, MD Primary Cardiologist:McLean Electrophysiologist:Allred Bi-V Pacing:>99% 1/12/2022Weight:252-256lbs   AT/AF Burden: 100% (taking Xarelto)  Transmission reviewed.    CorVue thoracic impedancesuggesting normal fluid levels.  Prescribed:   Torsemide20 mg take2tablets (40 mg total) daily. He reports DrMcLeanapproved of him taking extra Torsemide when neededat 06/28/2020 OV.  Klor Con 20 mEq take1tablet twice a day.  Spironolactone 25 mg take 1 tablet daily  Labs: 06/17/2020 Creatinine1.27, BUN21, Potassium3.3, [id number], GFR>60 05/18/2020 Creatinine1.46, BUN45, Potassium4.6, Sodium137, GFR52  03/23/2020 Creatinine1.44, BUN33, Potassium4.0, [id number] 03/06/2020 Creatinine 1.42, BUN 42, Potassium 4.3, Sodium 140, GFR 51-59 12/15/2019 Creatinine1.38, BUN46, Potassium4.3, Sodium138, GFR53-61 09/27/2019 Creatinine1.32, BUN36, Potassium4.7, Sodium140, GFR56->60  08/09/2019 Creatinine1.39, BUN38, Potassium4.1, Sodium139, GFR52->60 A complete set of results can be found in Results Review.  Recommendations: No changes and encouraged to call if experiencing any fluid symptoms.  Follow-up plan: ICM clinic phone appointment on3/21/2022.91 day device clinic remote transmission5/04/2020.   EP/Cardiology Office Visits: Recall 10/24/2020 for Dr Rayann Heman. Recall for 6/14/2022with Dr.McLean.  Copy of ICM check sent to Dr.Allred  3 month ICM trend: 08/27/2020.      Rosalene Billings, RN 08/29/2020 10:36 AM

## 2020-08-30 NOTE — Progress Notes (Signed)
Remote ICD transmission.   

## 2020-09-03 ENCOUNTER — Ambulatory Visit (INDEPENDENT_AMBULATORY_CARE_PROVIDER_SITE_OTHER): Payer: Medicare HMO

## 2020-09-03 DIAGNOSIS — I5022 Chronic systolic (congestive) heart failure: Secondary | ICD-10-CM

## 2020-09-03 DIAGNOSIS — I428 Other cardiomyopathies: Secondary | ICD-10-CM

## 2020-09-04 ENCOUNTER — Other Ambulatory Visit: Payer: Self-pay | Admitting: Student

## 2020-09-04 DIAGNOSIS — I5022 Chronic systolic (congestive) heart failure: Secondary | ICD-10-CM

## 2020-09-04 DIAGNOSIS — I484 Atypical atrial flutter: Secondary | ICD-10-CM

## 2020-09-04 DIAGNOSIS — S20211A Contusion of right front wall of thorax, initial encounter: Secondary | ICD-10-CM | POA: Diagnosis not present

## 2020-09-04 DIAGNOSIS — S83411A Sprain of medial collateral ligament of right knee, initial encounter: Secondary | ICD-10-CM | POA: Diagnosis not present

## 2020-09-04 LAB — CUP PACEART REMOTE DEVICE CHECK
Battery Remaining Longevity: 68 mo
Battery Remaining Percentage: 81 %
Battery Voltage: 2.98 V
Date Time Interrogation Session: 20220221020104
HighPow Impedance: 73 Ohm
Implantable Lead Implant Date: 20210209
Implantable Lead Implant Date: 20210209
Implantable Lead Implant Date: 20210209
Implantable Lead Location: 753858
Implantable Lead Location: 753859
Implantable Lead Location: 753860
Implantable Pulse Generator Implant Date: 20210209
Lead Channel Impedance Value: 360 Ohm
Lead Channel Impedance Value: 440 Ohm
Lead Channel Impedance Value: 800 Ohm
Lead Channel Pacing Threshold Amplitude: 0.75 V
Lead Channel Pacing Threshold Amplitude: 2 V
Lead Channel Pacing Threshold Pulse Width: 0.5 ms
Lead Channel Pacing Threshold Pulse Width: 0.5 ms
Lead Channel Sensing Intrinsic Amplitude: 12 mV
Lead Channel Sensing Intrinsic Amplitude: 4.1 mV
Lead Channel Setting Pacing Amplitude: 2.5 V
Lead Channel Setting Pacing Amplitude: 2.75 V
Lead Channel Setting Pacing Pulse Width: 0.5 ms
Lead Channel Setting Pacing Pulse Width: 0.5 ms
Lead Channel Setting Sensing Sensitivity: 0.5 mV
Pulse Gen Serial Number: 111006442

## 2020-09-10 NOTE — Progress Notes (Signed)
Remote ICD transmission.   

## 2020-09-26 DIAGNOSIS — S83411D Sprain of medial collateral ligament of right knee, subsequent encounter: Secondary | ICD-10-CM | POA: Diagnosis not present

## 2020-10-01 ENCOUNTER — Ambulatory Visit (INDEPENDENT_AMBULATORY_CARE_PROVIDER_SITE_OTHER): Payer: Medicare HMO

## 2020-10-01 DIAGNOSIS — Z95810 Presence of automatic (implantable) cardiac defibrillator: Secondary | ICD-10-CM

## 2020-10-01 DIAGNOSIS — I5022 Chronic systolic (congestive) heart failure: Secondary | ICD-10-CM

## 2020-10-03 ENCOUNTER — Telehealth: Payer: Self-pay

## 2020-10-03 NOTE — Telephone Encounter (Signed)
Remote ICM transmission received.  Attempted call to patient regarding ICM remote transmission and left message to return call   

## 2020-10-03 NOTE — Progress Notes (Signed)
EPIC Encounter for ICM Monitoring  Patient Name: [name] is a 70 y.o. male Date: 10/03/2020 Primary Care Physican: Seward Carol, MD Primary Cardiologist:McLean Electrophysiologist:Allred Bi-V Pacing:>99%(Feb report) 1/12/2022Weight:252-256lbs   AT/AF Burden: 100% (taking Xarelto) (Feb Report)  Attempted call to patient and unable to reach.  Transmission reviewed.   CorVue thoracic impedancesuggestingnormal fluid levels.  Prescribed:   Torsemide20 mg take2tablets (40 mg total) daily. He reports DrMcLeanapproved of him taking extra Torsemide when neededat 06/28/2020 OV.  Klor Con 20 mEq take1tablet twice a day.  Spironolactone 25 mg take 1 tablet daily  Labs: 06/28/2020 Creatinine 1.25, BUN 22, Potassium 3.7, Sodium 138, GFR >60 06/17/2020 Creatinine1.27, BUN21, Potassium3.3, Sodium132, GFR>60 A complete set of results can be found in Results Review.  Recommendations: Unable to reach.    Follow-up plan: ICM clinic phone appointment on 11/05/2020.91 day device clinic remote transmission5/04/2020.   EP/Cardiology Office Visits: Recall 10/24/2020 for Dr Rayann Heman. Recall for 6/14/2022with Dr.McLean.  Copy of ICM check sent to Dr.Allred  3 month ICM trend: 10/01/2020.    Rosalene Billings, RN 10/03/2020 10:50 AM

## 2020-10-05 NOTE — Progress Notes (Signed)
Spoke with patient.  He reports feeling well and denies any dehydration symptoms.  He thinks he may have decreased fluid intake for a few days when impedance was above baseline.  Advised to keep fluid intake approximately 64 oz daily to stay hydrated.  He is without complaints at this time.

## 2020-10-11 DIAGNOSIS — M25551 Pain in right hip: Secondary | ICD-10-CM | POA: Diagnosis not present

## 2020-10-11 DIAGNOSIS — R269 Unspecified abnormalities of gait and mobility: Secondary | ICD-10-CM | POA: Diagnosis not present

## 2020-10-11 DIAGNOSIS — M79604 Pain in right leg: Secondary | ICD-10-CM | POA: Diagnosis not present

## 2020-11-01 DIAGNOSIS — R269 Unspecified abnormalities of gait and mobility: Secondary | ICD-10-CM | POA: Diagnosis not present

## 2020-11-01 DIAGNOSIS — M25551 Pain in right hip: Secondary | ICD-10-CM | POA: Diagnosis not present

## 2020-11-01 DIAGNOSIS — M79604 Pain in right leg: Secondary | ICD-10-CM | POA: Diagnosis not present

## 2020-11-01 DIAGNOSIS — G4733 Obstructive sleep apnea (adult) (pediatric): Secondary | ICD-10-CM | POA: Diagnosis not present

## 2020-11-05 ENCOUNTER — Ambulatory Visit (INDEPENDENT_AMBULATORY_CARE_PROVIDER_SITE_OTHER): Payer: Medicare HMO

## 2020-11-05 DIAGNOSIS — I5022 Chronic systolic (congestive) heart failure: Secondary | ICD-10-CM | POA: Diagnosis not present

## 2020-11-05 DIAGNOSIS — Z95810 Presence of automatic (implantable) cardiac defibrillator: Secondary | ICD-10-CM

## 2020-11-06 NOTE — Progress Notes (Signed)
EPIC Encounter for ICM Monitoring  Patient Name: [name] is a 70 y.o. male Date: 11/06/2020 Primary Care Physican: Seward Carol, MD Primary Cardiologist:McLean Electrophysiologist:Allred Bi-V Pacing:>99%(Feb report) 4/26/2022Weight:268lbs   AT/AF Burden: 100% (taking Xarelto) (Feb Report)  Spoke with patient and reports feeling well at this time.  Denies fluid symptoms.  He has not been following low salt diet which by contribute to decreased impedance.  CorVue thoracic impedancesuggestingnormal fluid levels.  Prescribed:   Torsemide20 mg take2tablets (40 mg total) daily.He reports DrMcLeanapproved of him taking extra Torsemide when neededat 06/28/2020 OV.  Klor Con 20 mEq take1tablet twice a day.  Spironolactone 25 mg take 1 tablet daily  Labs: 06/28/2020 Creatinine 1.25, BUN 22, Potassium 3.7, Sodium 138, GFR >60 06/17/2020 Creatinine1.27, BUN21, Potassium3.3, Sodium132, GFR>60 A complete set of results can be found in Results Review.  Recommendations:No changes and encouraged to call if experiencing any fluid symptoms.  Follow-up plan: ICM clinic phone appointment on 12/17/2020.91 day device clinic remote transmission5/04/2020.   EP/Cardiology Office Visits: Recall 10/24/2020 for Dr Rayann Heman.  5/20/2022with Dr.McLean.  Copy of ICM check sent to Dr.Allred.  3 month ICM trend: 11/05/2020.    1 Year ICM trend:       Rosalene Billings, RN 11/06/2020 3:08 PM

## 2020-11-13 DIAGNOSIS — Z13220 Encounter for screening for lipoid disorders: Secondary | ICD-10-CM | POA: Diagnosis not present

## 2020-11-13 DIAGNOSIS — I4891 Unspecified atrial fibrillation: Secondary | ICD-10-CM | POA: Diagnosis not present

## 2020-11-13 DIAGNOSIS — M5431 Sciatica, right side: Secondary | ICD-10-CM | POA: Diagnosis not present

## 2020-11-13 DIAGNOSIS — Z125 Encounter for screening for malignant neoplasm of prostate: Secondary | ICD-10-CM | POA: Diagnosis not present

## 2020-11-13 DIAGNOSIS — E559 Vitamin D deficiency, unspecified: Secondary | ICD-10-CM | POA: Diagnosis not present

## 2020-11-13 DIAGNOSIS — N529 Male erectile dysfunction, unspecified: Secondary | ICD-10-CM | POA: Diagnosis not present

## 2020-11-13 DIAGNOSIS — M25532 Pain in left wrist: Secondary | ICD-10-CM | POA: Diagnosis not present

## 2020-11-13 DIAGNOSIS — Z0000 Encounter for general adult medical examination without abnormal findings: Secondary | ICD-10-CM | POA: Diagnosis not present

## 2020-11-14 DIAGNOSIS — M19042 Primary osteoarthritis, left hand: Secondary | ICD-10-CM | POA: Diagnosis not present

## 2020-11-14 DIAGNOSIS — M25832 Other specified joint disorders, left wrist: Secondary | ICD-10-CM | POA: Diagnosis not present

## 2020-11-14 DIAGNOSIS — M24232 Disorder of ligament, left wrist: Secondary | ICD-10-CM | POA: Diagnosis not present

## 2020-11-15 DIAGNOSIS — Z0100 Encounter for examination of eyes and vision without abnormal findings: Secondary | ICD-10-CM | POA: Diagnosis not present

## 2020-11-28 ENCOUNTER — Encounter: Payer: Self-pay | Admitting: Podiatry

## 2020-11-28 DIAGNOSIS — K5900 Constipation, unspecified: Secondary | ICD-10-CM | POA: Insufficient documentation

## 2020-11-28 DIAGNOSIS — M1610 Unilateral primary osteoarthritis, unspecified hip: Secondary | ICD-10-CM | POA: Insufficient documentation

## 2020-11-28 DIAGNOSIS — G5600 Carpal tunnel syndrome, unspecified upper limb: Secondary | ICD-10-CM | POA: Insufficient documentation

## 2020-11-28 DIAGNOSIS — Z952 Presence of prosthetic heart valve: Secondary | ICD-10-CM | POA: Insufficient documentation

## 2020-11-28 DIAGNOSIS — Z860100 Personal history of colon polyps, unspecified: Secondary | ICD-10-CM | POA: Insufficient documentation

## 2020-11-28 DIAGNOSIS — I10 Essential (primary) hypertension: Secondary | ICD-10-CM

## 2020-11-28 DIAGNOSIS — Z1211 Encounter for screening for malignant neoplasm of colon: Secondary | ICD-10-CM | POA: Insufficient documentation

## 2020-11-28 HISTORY — DX: Essential (primary) hypertension: I10

## 2020-11-29 ENCOUNTER — Other Ambulatory Visit: Payer: Self-pay

## 2020-11-29 ENCOUNTER — Ambulatory Visit (INDEPENDENT_AMBULATORY_CARE_PROVIDER_SITE_OTHER): Payer: Medicare HMO

## 2020-11-29 ENCOUNTER — Ambulatory Visit (INDEPENDENT_AMBULATORY_CARE_PROVIDER_SITE_OTHER): Payer: Medicare HMO | Admitting: Podiatry

## 2020-11-29 ENCOUNTER — Encounter: Payer: Self-pay | Admitting: Podiatry

## 2020-11-29 ENCOUNTER — Other Ambulatory Visit: Payer: Self-pay | Admitting: Podiatry

## 2020-11-29 DIAGNOSIS — M19079 Primary osteoarthritis, unspecified ankle and foot: Secondary | ICD-10-CM

## 2020-11-29 DIAGNOSIS — M7750 Other enthesopathy of unspecified foot: Secondary | ICD-10-CM

## 2020-11-29 DIAGNOSIS — M19071 Primary osteoarthritis, right ankle and foot: Secondary | ICD-10-CM

## 2020-11-29 DIAGNOSIS — Q666 Other congenital valgus deformities of feet: Secondary | ICD-10-CM

## 2020-11-29 MED ORDER — METHYLPREDNISOLONE 4 MG PO TABS: ORAL_TABLET | ORAL | 0 refills | Status: DC

## 2020-11-30 ENCOUNTER — Encounter (HOSPITAL_COMMUNITY): Payer: Self-pay | Admitting: Cardiology

## 2020-11-30 ENCOUNTER — Ambulatory Visit (HOSPITAL_COMMUNITY)
Admission: RE | Admit: 2020-11-30 | Discharge: 2020-11-30 | Disposition: A | Discharge status: Home / Self Care | Payer: Medicare HMO | Source: Ambulatory Visit | Attending: Cardiology | Admitting: Cardiology

## 2020-11-30 VITALS — BP 130/80 | HR 60 | Wt 279.2 lb

## 2020-11-30 DIAGNOSIS — G4733 Obstructive sleep apnea (adult) (pediatric): Secondary | ICD-10-CM | POA: Diagnosis not present

## 2020-11-30 DIAGNOSIS — I712 Thoracic aortic aneurysm, without rupture: Secondary | ICD-10-CM | POA: Insufficient documentation

## 2020-11-30 DIAGNOSIS — I5022 Chronic systolic (congestive) heart failure: Secondary | ICD-10-CM | POA: Diagnosis not present

## 2020-11-30 DIAGNOSIS — I480 Paroxysmal atrial fibrillation: Secondary | ICD-10-CM | POA: Insufficient documentation

## 2020-11-30 DIAGNOSIS — I428 Other cardiomyopathies: Secondary | ICD-10-CM | POA: Diagnosis not present

## 2020-11-30 DIAGNOSIS — Z79899 Other long term (current) drug therapy: Secondary | ICD-10-CM | POA: Diagnosis not present

## 2020-11-30 DIAGNOSIS — Z95810 Presence of automatic (implantable) cardiac defibrillator: Secondary | ICD-10-CM | POA: Diagnosis not present

## 2020-11-30 DIAGNOSIS — I110 Hypertensive heart disease with heart failure: Secondary | ICD-10-CM | POA: Insufficient documentation

## 2020-11-30 DIAGNOSIS — Z7901 Long term (current) use of anticoagulants: Secondary | ICD-10-CM | POA: Insufficient documentation

## 2020-11-30 HISTORY — DX: Heart failure, unspecified: I50.9

## 2020-11-30 LAB — BASIC METABOLIC PANEL
Anion gap: 6 (ref 5–15)
BUN: 22 mg/dL (ref 8–23)
CO2: 26 mmol/L (ref 22–32)
Calcium: 9.6 mg/dL (ref 8.9–10.3)
Chloride: 104 mmol/L (ref 98–111)
Creatinine, Ser: 1.16 mg/dL (ref 0.61–1.24)
GFR, Estimated: >60 mL/min (ref >60)
Glucose, Bld: 89 mg/dL (ref 70–99)
Potassium: 4.8 mmol/L (ref 3.5–5.1)
Sodium: 136 mmol/L (ref 135–145)

## 2020-11-30 LAB — CBC
HCT: 42.7 % (ref 39.0–52.0)
Hemoglobin: 13.9 g/dL (ref 13.0–17.0)
MCH: 28.6 pg (ref 26.0–34.0)
MCHC: 32.6 g/dL (ref 30.0–36.0)
MCV: 87.9 fL (ref 80.0–100.0)
Platelets: 142 10*3/uL — ABNORMAL LOW (ref 150–400)
RBC: 4.86 MIL/uL (ref 4.22–5.81)
RDW: 13.8 % (ref 11.5–15.5)
WBC: 8.7 10*3/uL (ref 4.0–10.5)
nRBC: 0 % (ref 0.0–0.2)

## 2020-11-30 LAB — LIPID PANEL
Cholesterol: 160 mg/dL (ref 0–200)
HDL: 37 mg/dL — ABNORMAL LOW (ref >40)
LDL Cholesterol: 94 mg/dL (ref 0–99)
Total CHOL/HDL Ratio: 4.3 RATIO
Triglycerides: 147 mg/dL (ref <150)
VLDL: 29 mg/dL (ref 0–40)

## 2020-11-30 NOTE — Patient Instructions (Addendum)
**  To qualify for patient assistance for your Xarelto you must spend $1400 out of pocket on prescriptions. Once you have met this please get a print out from your pharmacy showing this as well as proof of your income and let us know so we can sign you up for assistance.  Labs done today, your results will be available in MyChart, we will contact you for abnormal readings.  Non-Cardiac CT Angiography (CTA), is a special type of CT scan that uses a computer to produce multi-dimensional views of major blood vessels throughout the body. In CT angiography, a contrast material is injected through an IV to help visualize the blood vessels. This will be done in July, you will be contacted closer to that time to schedule this  Please call our office in September to schedule your follow up appointment and echocardiogram  If you have any questions or concerns before your next appointment please send [place] a message through [place] or call our office at [phone].    TO LEAVE A MESSAGE FOR THE NURSE SELECT OPTION 2, PLEASE LEAVE A MESSAGE INCLUDING: . YOUR NAME . DATE OF BIRTH . CALL BACK NUMBER . REASON FOR CALL**this is important as we prioritize the call backs  [place] AS LONG AS YOU CALL BEFORE 4:00 PM  At the [facility], you and your health needs are our priority. As part of our continuing mission to provide you with exceptional heart care, we have created designated Provider Care Teams. These Care Teams include your primary Cardiologist (physician) and Advanced Practice Providers (APPs- Physician Assistants and Nurse Practitioners) who all work together to provide you with the care you need, when you need it.   You may see any of the following providers on your designated Care Team at your next follow up: Marland Kitchen Dr Glori Bickers . Dr [name] . Dr Vickki Muff . Darrick Grinder, NP . Lyda Jester, [place] . Audry Riles,  PharmD   Please be sure to bring in all your medications bottles to every appointment.

## 2020-11-30 NOTE — Progress Notes (Signed)
Medication Samples have been provided to the patient.  Drug name: Xarelto       Strength: 20mg         Qty: 3  LOT: [id number]  Exp.Date: 8/23  Dosing instructions: take 1 tab daily  The patient has been instructed regarding the correct time, dose, and frequency of taking this medication, including desired effects and most common side effects.   [name] 3:39 PM 11/30/2020

## 2020-12-01 DIAGNOSIS — G4733 Obstructive sleep apnea (adult) (pediatric): Secondary | ICD-10-CM | POA: Diagnosis not present

## 2020-12-02 NOTE — Progress Notes (Signed)
PCP: Seward Carol, MD EP: Dr. Rayann Heman Cardiology: Dr. Gwenlyn Found HF Cardiology: Dr. Aundra Dubin  70 y.o. with history of chronic atrial fibrillation and flutter with failed Maze procedure, valvular heart disease s/p mitral and aortic valve repairs, and chronic systolic CHF was referred by Dr. Rayann Heman for CHF evaluation.  Patient has a long history of atrial fibrillation.  He was initially controlled by Tikosyn but it eventually became chronic.  He had significant mitral and aortic regurgitation, and in 11/19, he had mitral and aortic valve repairs with Maze and LA appendage ligation. Cath prior to valve surgery in 10/19 showed no significant coronary disease.  It appears from ECGs that he never went back into NSR.  He has had slow atrial fibrillation with rate in upper 30s-40s at times, so was never started on amiodarone.  Last echo in 4/20 showed EF 40-45% with stable aortic and mitral valve repairs.  He has developed worsening volume overload, and Lasix has been titrated up to 80 mg bid.  Most recently, he went into atypical atrial flutter with HR consistently in the 110s range.  He therefore was taken for DCCV 03/10/19.  Initially rhythm looked junctional in the 40s-50s, but he subsequently went back into rapid atypical atrial flutter with elevated rate (>100 bpm).   Echo was done in 9/20, showing EF worse at 20-25%.     He saw Dr. Rayann Heman and had atrial flutter + atrial fibrillation ablation in 10/20.  Post-procedure, he was noted to be in slow atrial fibrillation (rate 40s) at time of his atrial fibrillation clinic followup.   Patient had St Jude CRT-D implantation in 2/21.  In 6/21, he had AV nodal ablation to allow CRT given recurrent atrial flutter/fibrillation. Echo in 10/21 showed EF up to 60-65%.   He returns today for followup of CHF.  Since last appointment, he had another THR (now has had both hips replaced).  Weight is up > 20 lbs but he had neuropathic pain initially after last THR and was not  walking much.  Now doing better.   No exertional dyspnea or chest pain.  Much more active now.  No orthopnea/PND.  No lightheadedness or palpitations.     ECG (personally reviewed): Atrial fibrillation with BiV pacing  Labs (8/20): K 3.9, creatinine 1.14, BNP 473, hgb 13.3 Labs (9/20): K 3.3 => 3.7, creatinine 1.35 => 1.3 Labs (12/20): K 4.1, creatinine 1.2 Labs (1/21): K 4.1, creatinine 1.39 Labs (8/21): K 4.3, creatinine 1.42, plts 136 Labs (12/21): K 3.3, creatinine 1.27  PMH: 1. HTN 2. Paroxysmal atrial fibrillation/flutter: Failed Tikosyn.  Had Maze procedure 11/19 but does not appear to have ever gone back into NSR.  Had been in atrial fibrillation with bradycardia with rate in 30s-40s at times, now in atypical atrial flutter primarily with rate 100s-110s.  He failed DCCV 8/20.  No amiodarone due to h/o bradycardia.  - Atrial fibrillation + atrial flutter ablation in 10/20.  3. OSA: Uses CPAP.  4. Chronic systolic CHF: Nonischemic cardiomyopathy.  - 10/19 LHC: No coronary disease.  - Echo (4/20): EF 40-45%, mild LVH, moderate dilated RV with mildly decreased systolic function, s/p MV repair with no significant stenosis or regurgitation, s/p aortic valve repair with mild AI, mild aortic stenosis.  Dilated IVC.  - Echo (9/20): EF 20-25%, mild LVH with moderate LV dilation, s/p MV repair with mean gradient 6, s/p AoV repair with mean gradient 8. No significant MR or AI.  - St Jude CRT-D implantation in 2/21.  -  AV nodal ablation 6/21.  - Echo (10/21): EF 60-65%, mild LV enlargement and LVH, moderate RV enlargement with normal function, severe LVE, s/p MV repair with no MR and mean gradient 3, s/p aortic valve repair with mean gradient 9 and no AI, ascending aorta 4.6 cm.  5. Valvular heart disease: Patient has history of MR and AI.  In 11/19, he had mitral valve repair, aortic valve repair, surgical Maze, and LA appendage excision.  6. LBBB 7. Ascending aorta aneurysm: 4.6 cm on 10/21  echo.  8. Bilateral THRs  Social History   Socioeconomic History  . Marital status: Married    Spouse name: Not on file  . Number of children: Not on file  . Years of education: Not on file  . Highest education level: Not on file  Occupational History  . Occupation: Agricultural consultant  Tobacco Use  . Smoking status: Never Smoker  . Smokeless tobacco: Never Used  Vaping Use  . Vaping Use: Never used  Substance and Sexual Activity  . Alcohol use: No  . Drug use: No  . Sexual activity: Yes  Other Topics Concern  . Not on file  Social History Narrative   Pt lives in [place] with spouse.  Leadership training and behavioral safety training.   Social Determinants of Health   Financial Resource Strain: Not on file  Food Insecurity: Not on file  Transportation Needs: Not on file  Physical Activity: Not on file  Stress: Not on file  Social Connections: Not on file  Intimate Partner Violence: Not on file   Family History  Problem Relation Age of Onset  . Other Other        mother had a pacemaker  . Hypertension Father    ROS: All systems reviewed and negative except as per HPI.   Current Outpatient Medications  Medication Sig Dispense Refill  . acetaminophen (TYLENOL) 325 MG tablet Take 2 tablets (650 mg total) by mouth every 4 (four) hours as needed for headache or mild pain.    . Ascorbic Acid (VITAMIN C) 1000 MG tablet Take 1,000 mg by mouth daily.    . carvedilol (COREG) 6.25 MG tablet TAKE 1 TABLET BY MOUTH TWICE A DAY 180 tablet 3  . clobetasol ointment (TEMOVATE) 3.29 % Apply 1 application topically daily as needed (rash).    . diclofenac Sodium (VOLTAREN) 1 % GEL Apply 2 g topically daily as needed (pain).     Marland Kitchen HYDROcodone-acetaminophen (NORCO/VICODIN) 5-325 MG tablet Take 1 tablet by mouth every 8 (eight) hours as needed.    . ivermectin (STROMECTOL) 3 MG TABS tablet Take by mouth as needed.    Marland Kitchen MAGNESIUM PO Take 1 tablet by mouth daily.     . methylPREDNISolone (MEDROL) 4 MG tablet Take as directed 21 tablet 0  . Multiple Vitamin (MULTIVITAMIN WITH MINERALS) TABS tablet Take 1 tablet by mouth daily.     . Naproxen Sod-diphenhydrAMINE 220-25 MG TABS Take 2 tablets by mouth at bedtime as needed (sleep/pain).    . potassium chloride SA (KLOR-CON) 20 MEQ tablet Take 20 mEq by mouth 2 (two) times daily.    . Probiotic Product (PROBIOTIC PO) Take 1 capsule by mouth daily.    . sacubitril-valsartan (ENTRESTO) 24-26 MG Take 1 tablet by mouth 2 (two) times daily. 60 tablet 3  . spironolactone (ALDACTONE) 25 MG tablet Take 1 tablet (25 mg total) by mouth daily. 90 tablet 3  . tadalafil (CIALIS) 10 MG tablet Take 10  mg by mouth daily as needed.    . torsemide (DEMADEX) 20 MG tablet Take 2 tablets (40 mg total) by mouth daily. 180 tablet 2  . XARELTO 20 MG TABS tablet TAKE ONE TABLET BY MOUTH DAILY WITH SUPPER 30 tablet 5  . zinc gluconate 50 MG tablet Take 50 mg by mouth daily.     No current facility-administered medications for this encounter.   BP 130/80   Pulse 60   Wt 126.6 kg (279 lb 3.2 oz)   SpO2 96%   BMI 40.06 kg/m  General: NAD Neck: No JVD, no thyromegaly or thyroid nodule.  Lungs: Clear to auscultation bilaterally with normal respiratory effort. CV: Nondisplaced PMI.  Heart regular S1/S2, no S3/S4, no murmur.  No peripheral edema.  No carotid bruit.  Normal pedal pulses.  Abdomen: Soft, nontender, no hepatosplenomegaly, no distention.  Skin: Intact without lesions or rashes.  Neurologic: Alert and oriented x 3.  Psych: Normal affect. Extremities: No clubbing or cyanosis.  HEENT: Normal.   Assessment/Plan: 1. Chronic systolic CHF: Echo in 3/33 with EF 40-45%, moderate RV dilation/mildly decreased RV function.  Nonischemic cardiomyopathy, cath in 10/19 without significant coronary disease.  Repeat echo in 9/20 showed fall in EF to 20-25% in the setting of atrial fibrillation and atrial flutter with persistent  tachycardia.  He is now s/p St Jude CRT-D and AV nodal ablation.  Echo in 10/21 with EF up to 60-65%.  NYHA class II symptoms, no volume overload on exam.  I think weight gain is caloric, due to inactivity after last THR.  - For now, continue torsemide 40 mg daily.  BMET/BNP today.  - Continue spironolactone 25 mg daily.   - Continue Entresto 24/26 bid.    - Continue Coreg 6.25 mg bid.  - Repeat echo in 10/22 at followup appointment to make sure that EF remains increased.  2. Atrial arrhythmias: Long history of chronic atrial fibrillation, has had slow ventricular response in the past (HR 40s at times). He failed Tikosyn and have avoided amiodarone with bradycardia.  He had Maze procedure in 11/19 but it was not successful.  At initial appointment with me, he was in atypical flutter with HR 110s.  He failed DCCV in 8/20.  He had flutter/fibrillation ablation in 10/20.  Initially after ablation, he was in slow atrial fibrillation (rate upper 30s-50s generally).  He then went into atypical atrial flutter with rate 100s.  He is now s/p AV nodal ablation with CRT-D.  His underlying rhythm appears to be atrial fibrillation.  - He will continue Xarelto.  CBC today.  3. OSA: Continue CPAP.  4. Valvular heart disease: S/p MV and AoV repairs in 11/19.  Valves looked stable on last echo in 10/21.  - He will need antibiotic prophylaxis with dental work.  5. Ascending aortic aneurysm: 4.6 cm on 10/21 echo.  - I will schedule CTA chest to evaluate the full thoracic aorta.   Followup in 10/22 with echo.   [name] 12/02/2020

## 2020-12-03 ENCOUNTER — Ambulatory Visit (INDEPENDENT_AMBULATORY_CARE_PROVIDER_SITE_OTHER): Payer: Medicare HMO

## 2020-12-03 ENCOUNTER — Other Ambulatory Visit (HOSPITAL_COMMUNITY): Payer: Self-pay | Admitting: Cardiology

## 2020-12-03 DIAGNOSIS — I5022 Chronic systolic (congestive) heart failure: Secondary | ICD-10-CM

## 2020-12-04 LAB — CUP PACEART REMOTE DEVICE CHECK
Battery Remaining Longevity: 70 mo
Battery Remaining Percentage: 78 %
Battery Voltage: 2.96 V
Date Time Interrogation Session: 20220523020845
HighPow Impedance: 87 Ohm
Implantable Lead Implant Date: 20210209
Implantable Lead Implant Date: 20210209
Implantable Lead Implant Date: 20210209
Implantable Lead Location: 753858
Implantable Lead Location: 753859
Implantable Lead Location: 753860
Implantable Pulse Generator Implant Date: 20210209
Lead Channel Impedance Value: 390 Ohm
Lead Channel Impedance Value: 440 Ohm
Lead Channel Impedance Value: 810 Ohm
Lead Channel Pacing Threshold Amplitude: 0.75 V
Lead Channel Pacing Threshold Amplitude: 2 V
Lead Channel Pacing Threshold Pulse Width: 0.5 ms
Lead Channel Pacing Threshold Pulse Width: 0.5 ms
Lead Channel Sensing Intrinsic Amplitude: 12 mV
Lead Channel Sensing Intrinsic Amplitude: 4.1 mV
Lead Channel Setting Pacing Amplitude: 2.5 V
Lead Channel Setting Pacing Amplitude: 2.75 V
Lead Channel Setting Pacing Pulse Width: 0.5 ms
Lead Channel Setting Pacing Pulse Width: 0.5 ms
Lead Channel Setting Sensing Sensitivity: 0.5 mV
Pulse Gen Serial Number: 111006442

## 2020-12-05 ENCOUNTER — Encounter: Payer: Self-pay | Admitting: Podiatry

## 2020-12-05 NOTE — Progress Notes (Addendum)
Subjective:  Patient ID: [name], male    DOB: Sep 17, 1950,  MRN: [id number]  Chief Complaint  Patient presents with  . Foot Pain    Patient presents today for right lateral side foot pain x 3 days.  He says it started on Monday night and kept him awake.  Its stabbing pains at times and sharp with walking    70 y.o. male presents with the above complaint.  Patient presents with complaint of right lateral sided foot pain that has been there 3 days.  Patient states his flareup started all of a sudden on Monday kept him awake.  Is sharp stabbing at times worse with walking.  He has not seen anyone else prior to seeing me.  He will has tried any kind of taking it easy as well as Aleve and ice none of which has helped.  He would like to discuss treatment options for it.  He denies any other issues he has not seen anyone else prior to seeing me.   Review of Systems: Negative except as noted in the HPI. Denies N/V/F/Ch.  Past Medical History:  Diagnosis Date  . AICD (automatic cardioverter/defibrillator) present   . Aortic insufficiency 04/21/2018  . Aortic insufficiency   . Arthritis    "joints" (09/26/2013)  . Atrial enlargement, left   . CHF (congestive heart failure) ([place])   . Essential hypertension 11/28/2020  . Hearing aid worn    both ears  . Mitral regurgitation 04/21/2018  . Obesity   . OSA on CPAP    "mask adjusts to what I need" (09/26/2013)  . Persistent atrial fibrillation ([place])    cardioversion 06/06/13  . PONV (postoperative nausea and vomiting)   . Rotator cuff tear, right dec 2012   physical therapy done, decreased strength  . S/P aortic valve repair 05/18/2018   Complex valvuloplasty including plication of right coronary leaflet and 21 mm Biostable HAART annuloplasty ring  . S/P Maze operation for atrial fibrillation 05/18/2018   Complete bilateral atrial lesion set using bipolar radiofrequency and cryothermy ablation with clipping of LA appendage  . S/P mitral  valve repair 05/18/2018   Complex valvuloplasty including artificial Gore-tex neochord placement x4 and 30 mm Sorin Memo 4D ring annuloplasty  . Wears glasses     Current Outpatient Medications:  .  methylPREDNISolone (MEDROL) 4 MG tablet, Take as directed, Disp: 21 tablet, Rfl: 0 .  acetaminophen (TYLENOL) 325 MG tablet, Take 2 tablets (650 mg total) by mouth every 4 (four) hours as needed for headache or mild pain., Disp:  , Rfl:  .  Ascorbic Acid (VITAMIN C) 1000 MG tablet, Take 1,000 mg by mouth daily., Disp: , Rfl:  .  carvedilol (COREG) 6.25 MG tablet, TAKE 1 TABLET BY MOUTH TWICE A DAY, Disp: 180 tablet, Rfl: 3 .  clobetasol ointment (TEMOVATE) 6.25 %, Apply 1 application topically daily as needed (rash)., Disp: , Rfl:  .  diclofenac Sodium (VOLTAREN) 1 % GEL, Apply 2 g topically daily as needed (pain). , Disp: , Rfl:  .  ENTRESTO 24-26 MG, TAKE 1 TABLET BY MOUTH TWICE A DAY, Disp: 60 tablet, Rfl: 11 .  HYDROcodone-acetaminophen (NORCO/VICODIN) 5-325 MG tablet, Take 1 tablet by mouth every 8 (eight) hours as needed., Disp: , Rfl:  .  ivermectin (STROMECTOL) 3 MG TABS tablet, Take by mouth as needed., Disp: , Rfl:  .  MAGNESIUM PO, Take 1 tablet by mouth daily., Disp: , Rfl:  .  Multiple Vitamin (MULTIVITAMIN WITH MINERALS)  TABS tablet, Take 1 tablet by mouth daily. , Disp: , Rfl:  .  Naproxen Sod-diphenhydrAMINE 220-25 MG TABS, Take 2 tablets by mouth at bedtime as needed (sleep/pain)., Disp: , Rfl:  .  potassium chloride SA (KLOR-CON) 20 MEQ tablet, Take 20 mEq by mouth 2 (two) times daily., Disp: , Rfl:  .  Probiotic Product (PROBIOTIC PO), Take 1 capsule by mouth daily., Disp: , Rfl:  .  spironolactone (ALDACTONE) 25 MG tablet, Take 1 tablet (25 mg total) by mouth daily., Disp: 90 tablet, Rfl: 3 .  tadalafil (CIALIS) 10 MG tablet, Take 10 mg by mouth daily as needed., Disp: , Rfl:  .  torsemide (DEMADEX) 20 MG tablet, Take 2 tablets (40 mg total) by mouth daily., Disp: 180 tablet, Rfl:  2 .  XARELTO 20 MG TABS tablet, TAKE ONE TABLET BY MOUTH DAILY WITH SUPPER, Disp: 30 tablet, Rfl: 5 .  zinc gluconate 50 MG tablet, Take 50 mg by mouth daily., Disp: , Rfl:   Social History   Tobacco Use  Smoking Status Never Smoker  Smokeless Tobacco Never Used    Allergies  Allergen Reactions  . Metoprolol Tartrate Shortness Of Breath    Shortness of breath, pressure in chest and back  . Oxycodone Rash and Other (See Comments)    Can tolerate Hydrocodone   Objective:  There were no vitals filed for this visit. There is no height or weight on file to calculate BMI. Constitutional Well developed. Well nourished.  Vascular Dorsalis pedis pulses palpable bilaterally. Posterior tibial pulses palpable bilaterally. Capillary refill normal to all digits.  No cyanosis or clubbing noted. Pedal hair growth normal.  Neurologic Normal speech. Oriented to person, place, and time. Epicritic sensation to light touch grossly present bilaterally.  Dermatologic Nails well groomed and normal in appearance. No open wounds. No skin lesions.  Orthopedic:  Pain on palpation of right lateral midfoot.  Pain with mild range of motion of the fourth and fifth tarsometatarsal joint.  No pain on palpation with dorsiflexion of the digits ruling out extensor tendinitis/flexor tendinitis.  No pain at the Lisfranc interval.  The interval is maintained.   Radiographs: 3 views of skeletally mature adult right foot: Osteoarthritic changes noted to the right dorsal midfoot.  Calcaneovalgus foot structure noted with midfoot collapse.  No other bony abnormalities identified.  Hammertoe contractures noted. Assessment:   1. Arthritis of midfoot    Plan:  Patient was evaluated and treated and all questions answered.  Right lateral midfoot arthritis -I explained to the patient the etiology of arthritis and various treatment options were discussed.  Given the amount of pain that is having I believe patient will  benefit from steroid injection of decrease acute inflammatory component associate with pain.  Patient agrees with plan like to proceed with a steroid injection. -A steroid injection was performed at bilateral midfoot using 1% plain Lidocaine and 10 mg of Kenalog. This was well tolerated.  Pes planovalgus -I explained to the patient the etiology of pes planovalgus numbers treatment options were extensively discussed.  Given that his foot is not being supported properly I believe he will benefit from custom-made orthotics to help control the hindfoot motion support the arch of the foot take the stress away from the midfoot.  Patient states understanding like to proceed with the orthotics -He will be scheduled to get custom-made orthotics   No follow-ups on file.

## 2020-12-06 ENCOUNTER — Telehealth: Payer: Self-pay | Admitting: Podiatry

## 2020-12-06 DIAGNOSIS — M13841 Other specified arthritis, right hand: Secondary | ICD-10-CM | POA: Diagnosis not present

## 2020-12-06 DIAGNOSIS — M25532 Pain in left wrist: Secondary | ICD-10-CM | POA: Insufficient documentation

## 2020-12-06 DIAGNOSIS — M19031 Primary osteoarthritis, right wrist: Secondary | ICD-10-CM | POA: Diagnosis not present

## 2020-12-06 NOTE — Telephone Encounter (Signed)
Patient is interested in orthotics, stated It was discussed during last encounter. I informed patient that as long as its listed in the appointment notes we could proceed with scheduling. I didn't see anything in appointment notes regarding the consult, patient asked if I could send message to [facility] to see if patient could go ahead schedule for casting, Please Advise

## 2020-12-06 NOTE — Telephone Encounter (Signed)
I have addended my notes to include orthotics.  He can go ahead and schedule an appointment

## 2020-12-06 NOTE — Addendum Note (Signed)
Addended by: Boneta Lucks on: 12/06/2020 02:55 PM   Modules accepted: Level of Service

## 2020-12-06 NOTE — Telephone Encounter (Signed)
Lvm for patient to get scheduled

## 2020-12-07 DIAGNOSIS — M180 Bilateral primary osteoarthritis of first carpometacarpal joints: Secondary | ICD-10-CM | POA: Insufficient documentation

## 2020-12-07 DIAGNOSIS — M19039 Primary osteoarthritis, unspecified wrist: Secondary | ICD-10-CM | POA: Insufficient documentation

## 2020-12-12 ENCOUNTER — Other Ambulatory Visit: Payer: Self-pay

## 2020-12-12 ENCOUNTER — Other Ambulatory Visit: Payer: Medicare HMO

## 2020-12-17 ENCOUNTER — Ambulatory Visit (INDEPENDENT_AMBULATORY_CARE_PROVIDER_SITE_OTHER): Payer: Medicare HMO | Admitting: Podiatry

## 2020-12-17 ENCOUNTER — Other Ambulatory Visit: Payer: Self-pay

## 2020-12-17 ENCOUNTER — Ambulatory Visit (INDEPENDENT_AMBULATORY_CARE_PROVIDER_SITE_OTHER): Payer: Medicare HMO

## 2020-12-17 DIAGNOSIS — M19079 Primary osteoarthritis, unspecified ankle and foot: Secondary | ICD-10-CM | POA: Diagnosis not present

## 2020-12-17 DIAGNOSIS — Z95810 Presence of automatic (implantable) cardiac defibrillator: Secondary | ICD-10-CM

## 2020-12-17 DIAGNOSIS — I5022 Chronic systolic (congestive) heart failure: Secondary | ICD-10-CM

## 2020-12-17 DIAGNOSIS — Q666 Other congenital valgus deformities of feet: Secondary | ICD-10-CM

## 2020-12-17 NOTE — Progress Notes (Signed)
Patient presents to be casted for orthotics.  A foam impression was casted for his right and left foot.  Patient is a size 12  Patient will be contacted when the orthotics are ready for pick up.

## 2020-12-17 NOTE — Progress Notes (Signed)
EPIC Encounter for ICM Monitoring  Patient Name: [name] is a 70 y.o. male Date: 12/17/2020 Primary Care Physican: Seward Carol, MD Primary Cardiologist:McLean Electrophysiologist:Allred Bi-V Pacing:>100% 6/6/2022Weight:270lbs  AT/AF Burden: 100% (taking Xarelto)  Spoke with patient and reports weight gain of 5 lbs overnight.  He reports taking his diuretic late and had too much fluid intake.  CorVue thoracic impedancesuggestingpossible fluid accumulation since 12/16/2020.  Prescribed:   Torsemide20 mg take2tablets (40 mg total) daily.He reports DrMcLeanapproved of him taking extra Torsemide when neededat 06/28/2020 OV.  Klor Con 20 mEq take1tablet twice a day.  Spironolactone 25 mg take 1 tablet daily  Labs: 11/30/2020 Creatinine 1.16, BUN 22, Potassium 4.8, Sodium 136, GFR >60 A complete set of results can be found in Results Review.  Recommendations:He self adjusted Torsemide and took extra tablet today and will do so tomorrow if weight has not returned to baseline.   Follow-up plan: ICM clinic phone appointment on6/04/2021 (manual) to recheck fluid levels.91 day device clinic remote transmission8/22/2021.   EP/Cardiology Office Visits: Recall 10/24/2020 for Dr Rayann Heman.  5/20/2022with Dr.McLean.  Copy of ICM check sent to Dr.Allred.  3 month ICM trend: 12/17/2020.    1 Year ICM trend:       Rosalene Billings, RN 12/17/2020 11:06 AM

## 2020-12-21 ENCOUNTER — Ambulatory Visit (INDEPENDENT_AMBULATORY_CARE_PROVIDER_SITE_OTHER): Payer: Medicare HMO

## 2020-12-21 DIAGNOSIS — Z95810 Presence of automatic (implantable) cardiac defibrillator: Secondary | ICD-10-CM

## 2020-12-21 DIAGNOSIS — I5022 Chronic systolic (congestive) heart failure: Secondary | ICD-10-CM

## 2020-12-21 NOTE — Progress Notes (Signed)
EPIC Encounter for ICM Monitoring  Patient Name: [name] is a 70 y.o. male Date: 12/21/2020 Primary Care Physican: Seward Carol, MD Primary Cardiologist: Aundra Dubin Electrophysiologist: Allred Bi-V Pacing: >100%        12/17/2020 Weight: 270 lbs                                                          AT/AF Burden: 100% (taking Xarelto)    Spoke with patient and reports symptoms resolved and he is feeling well.   CorVue thoracic impedance suggesting fluid levels returned to normal after taking extra Torsemide.   Prescribed: Torsemide 20 mg take 2 tablets (40 mg total) daily.  He reports [name] approved of him taking extra Torsemide when needed at 06/28/2020 OV. Klor Con 20 mEq take 1 tablet twice a day.  Spironolactone 25 mg take 1 tablet daily   Labs: 11/30/2020 Creatinine 1.16, BUN 22, Potassium 4.8, Sodium 136, GFR >60 A complete set of results can be found in Results Review.   Recommendations: No changes and encouraged to call if experiencing any fluid symptoms.   Follow-up plan: ICM clinic phone appointment on 01/28/2021.   91 day device clinic remote transmission 03/04/2020.     EP/Cardiology Office Visits:  Recall 10/24/2020 for Dr Rayann Heman.  Recall 05/29/2021 with Dr. Aundra Dubin.   Copy of ICM check sent to Dr. Rayann Heman.  3 month ICM trend: 12/21/2020.    1 Year ICM trend:       Rosalene Billings, RN 12/21/2020 10:59 AM

## 2020-12-25 NOTE — Progress Notes (Signed)
Remote ICD transmission.   

## 2020-12-28 ENCOUNTER — Ambulatory Visit: Payer: Medicare HMO | Admitting: Podiatry

## 2020-12-28 ENCOUNTER — Other Ambulatory Visit: Payer: Self-pay | Admitting: Internal Medicine

## 2020-12-28 ENCOUNTER — Other Ambulatory Visit: Payer: Self-pay

## 2020-12-28 DIAGNOSIS — M19079 Primary osteoarthritis, unspecified ankle and foot: Secondary | ICD-10-CM | POA: Diagnosis not present

## 2020-12-28 DIAGNOSIS — Q666 Other congenital valgus deformities of feet: Secondary | ICD-10-CM | POA: Diagnosis not present

## 2020-12-28 NOTE — Telephone Encounter (Signed)
Prescription refill request for Xarelto received.  Indication:atrial fib Last office visit:3/22 Weight:126.6 kg Age:70 Scr:1.1 CrCl:115.09 ml/min  Prescription refilled

## 2021-01-01 ENCOUNTER — Encounter: Payer: Self-pay | Admitting: Podiatry

## 2021-01-01 DIAGNOSIS — G4733 Obstructive sleep apnea (adult) (pediatric): Secondary | ICD-10-CM | POA: Diagnosis not present

## 2021-01-01 NOTE — Progress Notes (Signed)
Subjective:  Patient ID: [name], male    DOB: 06-06-1951,  MRN: [id number]  Chief Complaint  Patient presents with   Arthritis    PT stated that he is doing well he has no new concerns at this time and denies any pain at this time     70 y.o. male presents with the above complaint.  Patient presents with follow-up of right lateral midfoot arthritis.  Patient states is doing a lot better.  He has been functioning with his orthotics.  He denies any other acute complaints.  He is 100% healed   Review of Systems: Negative except as noted in the HPI. Denies N/V/F/Ch.  Past Medical History:  Diagnosis Date   AICD (automatic cardioverter/defibrillator) present    Aortic insufficiency 04/21/2018   Aortic insufficiency    Arthritis    "joints" (09/26/2013)   Atrial enlargement, left    CHF (congestive heart failure) ([facility])    Essential hypertension 11/28/2020   Hearing aid worn    both ears   Mitral regurgitation 04/21/2018   Obesity    OSA on CPAP    "mask adjusts to what I need" (09/26/2013)   Persistent atrial fibrillation ([facility])    cardioversion 06/06/13   PONV (postoperative nausea and vomiting)    Rotator cuff tear, right dec 2012   physical therapy done, decreased strength   S/P aortic valve repair 05/18/2018   Complex valvuloplasty including plication of right coronary leaflet and 21 mm Biostable HAART annuloplasty ring   S/P Maze operation for atrial fibrillation 05/18/2018   Complete bilateral atrial lesion set using bipolar radiofrequency and cryothermy ablation with clipping of LA appendage   S/P mitral valve repair 05/18/2018   Complex valvuloplasty including artificial Gore-tex neochord placement x4 and 30 mm Sorin Memo 4D ring annuloplasty   Wears glasses     Current Outpatient Medications:    acetaminophen (TYLENOL) 325 MG tablet, Take 2 tablets (650 mg total) by mouth every 4 (four) hours as needed for headache or mild pain., Disp:  , Rfl:    Ascorbic Acid  (VITAMIN C) 1000 MG tablet, Take 1,000 mg by mouth daily., Disp: , Rfl:    carvedilol (COREG) 6.25 MG tablet, TAKE 1 TABLET BY MOUTH TWICE A DAY, Disp: 180 tablet, Rfl: 3   clobetasol ointment (TEMOVATE) 6.56 %, Apply 1 application topically daily as needed (rash)., Disp: , Rfl:    diclofenac Sodium (VOLTAREN) 1 % GEL, Apply 2 g topically daily as needed (pain). , Disp: , Rfl:    ENTRESTO 24-26 MG, TAKE 1 TABLET BY MOUTH TWICE A DAY, Disp: 60 tablet, Rfl: 11   HYDROcodone-acetaminophen (NORCO/VICODIN) 5-325 MG tablet, Take 1 tablet by mouth every 8 (eight) hours as needed., Disp: , Rfl:    ivermectin (STROMECTOL) 3 MG TABS tablet, Take by mouth as needed., Disp: , Rfl:    MAGNESIUM PO, Take 1 tablet by mouth daily., Disp: , Rfl:    methylPREDNISolone (MEDROL) 4 MG tablet, Take as directed, Disp: 21 tablet, Rfl: 0   Multiple Vitamin (MULTIVITAMIN WITH MINERALS) TABS tablet, Take 1 tablet by mouth daily. , Disp: , Rfl:    Naproxen Sod-diphenhydrAMINE 220-25 MG TABS, Take 2 tablets by mouth at bedtime as needed (sleep/pain)., Disp: , Rfl:    potassium chloride SA (KLOR-CON) 20 MEQ tablet, Take 20 mEq by mouth 2 (two) times daily., Disp: , Rfl:    Probiotic Product (PROBIOTIC PO), Take 1 capsule by mouth daily., Disp: , Rfl:  spironolactone (ALDACTONE) 25 MG tablet, Take 1 tablet (25 mg total) by mouth daily., Disp: 90 tablet, Rfl: 3   tadalafil (CIALIS) 10 MG tablet, Take 10 mg by mouth daily as needed., Disp: , Rfl:    torsemide (DEMADEX) 20 MG tablet, Take 2 tablets (40 mg total) by mouth daily., Disp: 180 tablet, Rfl: 2   XARELTO 20 MG TABS tablet, TAKE ONE TABLET BY MOUTH DAILY WITH SUPPER, Disp: 30 tablet, Rfl: 5   zinc gluconate 50 MG tablet, Take 50 mg by mouth daily., Disp: , Rfl:   Social History   Tobacco Use  Smoking Status Never  Smokeless Tobacco Never    Allergies  Allergen Reactions   Metoprolol Tartrate Shortness Of Breath    Shortness of breath, pressure in chest and back    Oxycodone Rash and Other (See Comments)    Can tolerate Hydrocodone   Objective:  There were no vitals filed for this visit. There is no height or weight on file to calculate BMI. Constitutional Well developed. Well nourished.  Vascular Dorsalis pedis pulses palpable bilaterally. Posterior tibial pulses palpable bilaterally. Capillary refill normal to all digits.  No cyanosis or clubbing noted. Pedal hair growth normal.  Neurologic Normal speech. Oriented to person, place, and time. Epicritic sensation to light touch grossly present bilaterally.  Dermatologic Nails well groomed and normal in appearance. No open wounds. No skin lesions.  Orthopedic: Now pain on palpation of right lateral midfoot.  No pain with mild range of motion of the fourth and fifth tarsometatarsal joint.  No pain on palpation with dorsiflexion of the digits ruling out extensor tendinitis/flexor tendinitis.  No pain at the Lisfranc interval.  The interval is maintained.   Radiographs: 3 views of skeletally mature adult right foot: Osteoarthritic changes noted to the right dorsal midfoot.  Calcaneovalgus foot structure noted with midfoot collapse.  No other bony abnormalities identified.  Hammertoe contractures noted. Assessment:   1. Pes planovalgus   2. Arthritis of midfoot     Plan:  Patient was evaluated and treated and all questions answered.  Right lateral midfoot arthritis -Clinically healed with a steroid injection.  I discussed shoe gear modification wearing orthotics for further prevention.  Patient states understanding will do so.  Pes planovalgus -I explained to the patient the etiology of pes planovalgus numbers treatment options were extensively discussed.  Given that his foot is not being supported properly I believe he will benefit from custom-made orthotics to help control the hindfoot motion support the arch of the foot take the stress away from the midfoot.  Patient states understanding  like to proceed with the orthotics -Patient has obtained orthotics and is functioning well in them.   No follow-ups on file.

## 2021-01-15 ENCOUNTER — Ambulatory Visit (INDEPENDENT_AMBULATORY_CARE_PROVIDER_SITE_OTHER): Payer: Medicare HMO

## 2021-01-15 ENCOUNTER — Other Ambulatory Visit: Payer: Self-pay

## 2021-01-15 DIAGNOSIS — Q666 Other congenital valgus deformities of feet: Secondary | ICD-10-CM

## 2021-01-15 NOTE — Progress Notes (Signed)
Patient in office today to pick-up custom orthotics. Patient tried the inserts in a pair of [place] sneakers an was satisfied with the fit. Patient was educated on the break-in process and verbalized understanding at this time. Advised patient to call the office with any questions, comments, or concerns.

## 2021-01-28 ENCOUNTER — Ambulatory Visit (INDEPENDENT_AMBULATORY_CARE_PROVIDER_SITE_OTHER): Payer: Medicare HMO

## 2021-01-28 ENCOUNTER — Telehealth (HOSPITAL_COMMUNITY): Payer: Self-pay | Admitting: *Deleted

## 2021-01-28 DIAGNOSIS — Z95810 Presence of automatic (implantable) cardiac defibrillator: Secondary | ICD-10-CM | POA: Diagnosis not present

## 2021-01-28 DIAGNOSIS — I5022 Chronic systolic (congestive) heart failure: Secondary | ICD-10-CM

## 2021-01-30 ENCOUNTER — Telehealth (HOSPITAL_COMMUNITY): Payer: Self-pay | Admitting: Pharmacy Technician

## 2021-01-30 ENCOUNTER — Other Ambulatory Visit (HOSPITAL_COMMUNITY): Payer: Self-pay | Admitting: *Deleted

## 2021-01-30 ENCOUNTER — Other Ambulatory Visit (HOSPITAL_COMMUNITY): Payer: Self-pay

## 2021-01-30 MED ORDER — ENTRESTO 24-26 MG PO TABS
1.0000 | ORAL_TABLET | Freq: Two times a day (BID) | ORAL | 3 refills | Status: DC
Start: 2021-01-30 — End: 2021-10-10

## 2021-01-30 NOTE — Telephone Encounter (Signed)
Advanced Heart Failure Patient Advocate Encounter  I received a message that the patient was requesting help with Xarelto. Called and spoke with the patient, he is currently in the donut hole causing his co-pays to be significantly higher. Explained to patient in order to get Xarelto assistance, 4% OOP has to be spent on medications in the same calendar year. Patient does not think he has met that amount (about $2160).  I was able to get the patient a PAN HF grant that will cover the cost of Entresto. Emailed patient grant information.   Member ID: [id number] Group ID: [id number] RxBin ID: [id number] PCN: [id number] Eligibility Start Date: 11/01/2020 Eligibility End Date: 10/31/2021 Assistance Amount: $1,000.00  Sent Jasmine, ([place]) a request for 90 day RX to be sent to [facility], per patient's request. Fatima [place] information will be attached to RX as well.  Charlann Boxer, CPhT

## 2021-01-30 NOTE — Progress Notes (Signed)
EPIC Encounter for ICM Monitoring  Patient Name: [name] is a 70 y.o. male Date: 01/30/2021 Primary Care Physican: Seward Carol, MD Primary Cardiologist: Aundra Dubin Electrophysiologist: Allred Bi-V Pacing: >99%        01/30/2021 Weight: 275 lbs                                                          AT/AF Burden: 93% (taking Xarelto)    Spoke with patient and reports symptoms resolved and he is feeling well in regards to his heart but he is having pain in feet and hands from arthritis.   CorVue thoracic impedance suggesting normal fluid levels.   Prescribed: Torsemide 20 mg take 2 tablets (40 mg total) daily.  He reports [name] approved of him taking extra Torsemide when needed at 06/28/2020 OV. Klor Con 20 mEq take 1 tablet twice a day.  Spironolactone 25 mg take 1 tablet daily   Labs: 11/30/2020 Creatinine 1.16, BUN 22, Potassium 4.8, Sodium 136, GFR >60 A complete set of results can be found in Results Review.   Recommendations: No changes and encouraged to call if experiencing any fluid symptoms.   Follow-up plan: ICM clinic phone appointment on 03/05/2021.   91 day device clinic remote transmission 03/04/2020.     EP/Cardiology Office Visits:  Recall 10/24/2020 for Dr Rayann Heman.  Recall 05/29/2021 with Dr. Aundra Dubin.   Copy of ICM check sent to Dr. Rayann Heman.   3 month ICM trend: 01/28/2021.    1 Year ICM trend:       Rosalene Billings, RN 01/30/2021 10:55 AM

## 2021-02-05 ENCOUNTER — Ambulatory Visit (HOSPITAL_BASED_OUTPATIENT_CLINIC_OR_DEPARTMENT_OTHER)
Admission: RE | Admit: 2021-02-05 | Discharge: 2021-02-05 | Disposition: A | Discharge status: Home / Self Care | Payer: Medicare HMO | Source: Ambulatory Visit | Attending: Cardiology | Admitting: Cardiology

## 2021-02-05 ENCOUNTER — Other Ambulatory Visit: Payer: Self-pay

## 2021-02-05 ENCOUNTER — Encounter (HOSPITAL_BASED_OUTPATIENT_CLINIC_OR_DEPARTMENT_OTHER): Payer: Self-pay

## 2021-02-05 DIAGNOSIS — I719 Aortic aneurysm of unspecified site, without rupture: Secondary | ICD-10-CM | POA: Insufficient documentation

## 2021-02-05 DIAGNOSIS — I712 Thoracic aortic aneurysm, without rupture: Secondary | ICD-10-CM | POA: Diagnosis not present

## 2021-02-05 LAB — POCT I-STAT CREATININE: Creatinine, Ser: 1.5 mg/dL — ABNORMAL HIGH (ref 0.61–1.24)

## 2021-02-05 MED ORDER — IOHEXOL 350 MG/ML SOLN
75.0000 mL | Freq: Once | INTRAVENOUS | Status: AC | PRN
  Administered 2021-02-05: 75 mL via INTRAVENOUS

## 2021-02-06 ENCOUNTER — Encounter (HOSPITAL_COMMUNITY): Payer: Self-pay | Admitting: *Deleted

## 2021-03-04 ENCOUNTER — Ambulatory Visit (INDEPENDENT_AMBULATORY_CARE_PROVIDER_SITE_OTHER): Payer: Medicare HMO

## 2021-03-04 DIAGNOSIS — I428 Other cardiomyopathies: Secondary | ICD-10-CM | POA: Diagnosis not present

## 2021-03-04 LAB — CUP PACEART REMOTE DEVICE CHECK
Battery Remaining Longevity: 67 mo
Battery Remaining Percentage: 75 %
Battery Voltage: 2.98 V
Date Time Interrogation Session: 20220822022136
HighPow Impedance: 83 Ohm
Implantable Lead Implant Date: 20210209
Implantable Lead Implant Date: 20210209
Implantable Lead Implant Date: 20210209
Implantable Lead Location: 753858
Implantable Lead Location: 753859
Implantable Lead Location: 753860
Implantable Pulse Generator Implant Date: 20210209
Lead Channel Impedance Value: 430 Ohm
Lead Channel Impedance Value: 430 Ohm
Lead Channel Impedance Value: 790 Ohm
Lead Channel Pacing Threshold Amplitude: 0.75 V
Lead Channel Pacing Threshold Amplitude: 2 V
Lead Channel Pacing Threshold Pulse Width: 0.5 ms
Lead Channel Pacing Threshold Pulse Width: 0.5 ms
Lead Channel Sensing Intrinsic Amplitude: 12 mV
Lead Channel Sensing Intrinsic Amplitude: 3.1 mV
Lead Channel Setting Pacing Amplitude: 2.5 V
Lead Channel Setting Pacing Amplitude: 2.75 V
Lead Channel Setting Pacing Pulse Width: 0.5 ms
Lead Channel Setting Pacing Pulse Width: 0.5 ms
Lead Channel Setting Sensing Sensitivity: 0.5 mV
Pulse Gen Serial Number: 111006442

## 2021-03-05 ENCOUNTER — Ambulatory Visit (INDEPENDENT_AMBULATORY_CARE_PROVIDER_SITE_OTHER): Payer: Medicare HMO

## 2021-03-05 DIAGNOSIS — I5022 Chronic systolic (congestive) heart failure: Secondary | ICD-10-CM

## 2021-03-05 DIAGNOSIS — Z95810 Presence of automatic (implantable) cardiac defibrillator: Secondary | ICD-10-CM

## 2021-03-11 NOTE — Progress Notes (Signed)
EPIC Encounter for ICM Monitoring  Patient Name: [name] is a 70 y.o. male Date: 03/11/2021 Primary Care Physican: Seward Carol, MD Primary Cardiologist: Aundra Dubin Electrophysiologist: Allred Bi-V Pacing: >99%        01/30/2021 Weight: 275 lbs 03/11/2021 Weight: 274 lbs                                                          AT/AF Burden: 87% (taking Xarelto)    Spoke with patient and reports he on vacation during decreased impedance.  He skipped a few dosese of Torsemide and ate foods higher than normal during decreaed impedance but is feeling fine.     CorVue thoracic impedance suggesting normal fluid levels but was suggesting possible fluid accumulation from 8/5-8/18.   Prescribed: Torsemide 20 mg take 2 tablets (40 mg total) daily.  He reports [name] approved of him taking extra Torsemide when needed at 06/28/2020 OV. Klor Con 20 mEq take 1 tablet twice a day.  Spironolactone 25 mg take 1 tablet daily   Labs: 11/30/2020 Creatinine 1.16, BUN 22, Potassium 4.8, Sodium 136, GFR >60 A complete set of results can be found in Results Review.   Recommendations: No changes and encouraged to call if experiencing any fluid symptoms.   Follow-up plan: ICM clinic phone appointment on 04/15/2021.   91 day device clinic remote transmission 06/03/2021.     EP/Cardiology Office Visits:  Recall 10/24/2020 for Dr Rayann Heman.  Recall 05/29/2021 with Dr. Aundra Dubin.   Copy of ICM check sent to Dr. Rayann Heman.    3 month ICM trend: 03/05/2021.    1 Year ICM trend:       Rosalene Billings, RN 03/11/2021 9:50 AM

## 2021-03-20 NOTE — Progress Notes (Signed)
Remote ICD transmission.   

## 2021-03-21 ENCOUNTER — Telehealth: Payer: Self-pay | Admitting: Podiatry

## 2021-03-21 NOTE — Telephone Encounter (Signed)
Pt left message stating he has tried the orthotics and cannot get over 3 to 4 hours before he is in severe pain.He also stated he has not paid for them yet as he is not able to wear them.  I returned call and pt is scheduled to see EJ on 9.30 to see what adjustments need to be made and if he can do it in the office or if we need to send them back we can do it if needed. He states he has not paid for them yet because he is not able to wear them.  I told him I would talk with billing and see what can be done about that. He said thank you. Pt is also on the waitlist incase we have any appts to [facility] and I will call pt to see if we could get him in sooner.

## 2021-04-05 ENCOUNTER — Other Ambulatory Visit: Payer: Self-pay

## 2021-04-05 ENCOUNTER — Ambulatory Visit (INDEPENDENT_AMBULATORY_CARE_PROVIDER_SITE_OTHER): Payer: Medicare HMO | Admitting: *Deleted

## 2021-04-05 DIAGNOSIS — Q666 Other congenital valgus deformities of feet: Secondary | ICD-10-CM

## 2021-04-05 NOTE — Progress Notes (Signed)
Patient presents to the office today with issues concerning his orthotics.   He states that he is unable to wear the orthotics comfortably all day. He is having pain laterally both feet, right over left with also severe pain lateral legs.  I feel the patient is rolling out with the orthotics causing strain to the side of the leg and extra pressure against the sides of the feet.  Orthotics will be sent back for adjustment/modifications.   Patient will be notified for a fitting appointment once the orthotics arrive in office.

## 2021-04-12 ENCOUNTER — Other Ambulatory Visit: Payer: Medicare HMO

## 2021-04-15 ENCOUNTER — Ambulatory Visit (INDEPENDENT_AMBULATORY_CARE_PROVIDER_SITE_OTHER): Payer: Medicare HMO

## 2021-04-15 DIAGNOSIS — I5022 Chronic systolic (congestive) heart failure: Secondary | ICD-10-CM

## 2021-04-15 DIAGNOSIS — Z95810 Presence of automatic (implantable) cardiac defibrillator: Secondary | ICD-10-CM

## 2021-04-17 NOTE — Progress Notes (Signed)
EPIC Encounter for ICM Monitoring  Patient Name: [name] is a 70 y.o. male Date: 04/17/2021 Primary Care Physican: Seward Carol, MD Primary Cardiologist: Aundra Dubin Electrophysiologist: Allred Bi-V Pacing: >99%        03/11/2021 Weight: 274 lbs                                                          AT/AF Burden: 77% (taking Xarelto)    Spoke with patient and heart failure questions reviewed.  Pt asymptomatic for fluid accumulation and feeling well.   CorVue thoracic impedance suggesting normal fluid levels.   Prescribed: Torsemide 20 mg take 2 tablets (40 mg total) daily.  He reports [name] approved of him taking extra Torsemide when needed at 06/28/2020 OV. Klor Con 20 mEq take 1 tablet twice a day.  Spironolactone 25 mg take 1 tablet daily   Labs: 11/30/2020 Creatinine 1.16, BUN 22, Potassium 4.8, Sodium 136, GFR >60 A complete set of results can be found in Results Review.   Recommendations: No changes and encouraged to call if experiencing any fluid symptoms.   Follow-up plan: ICM clinic phone appointment on 05/20/2021.   91 day device clinic remote transmission 06/03/2021.     EP/Cardiology Office Visits:  Recall 10/24/2020 for Dr Rayann Heman.  Recall 05/29/2021 with Dr. Aundra Dubin.   Copy of ICM check sent to Dr. Rayann Heman.     3 month ICM trend: 04/15/2021.    1 Year ICM trend:       Rosalene Billings, RN 04/17/2021 4:09 PM

## 2021-04-22 DIAGNOSIS — I509 Heart failure, unspecified: Secondary | ICD-10-CM | POA: Diagnosis not present

## 2021-04-22 DIAGNOSIS — Z6838 Body mass index (BMI) 38.0-38.9, adult: Secondary | ICD-10-CM | POA: Diagnosis not present

## 2021-04-22 DIAGNOSIS — I2510 Atherosclerotic heart disease of native coronary artery without angina pectoris: Secondary | ICD-10-CM | POA: Diagnosis not present

## 2021-04-22 DIAGNOSIS — I110 Hypertensive heart disease with heart failure: Secondary | ICD-10-CM | POA: Diagnosis not present

## 2021-04-22 DIAGNOSIS — D6869 Other thrombophilia: Secondary | ICD-10-CM | POA: Diagnosis not present

## 2021-04-22 DIAGNOSIS — N529 Male erectile dysfunction, unspecified: Secondary | ICD-10-CM | POA: Diagnosis not present

## 2021-04-22 DIAGNOSIS — I495 Sick sinus syndrome: Secondary | ICD-10-CM | POA: Diagnosis not present

## 2021-04-22 DIAGNOSIS — I4891 Unspecified atrial fibrillation: Secondary | ICD-10-CM | POA: Diagnosis not present

## 2021-04-22 DIAGNOSIS — M1990 Unspecified osteoarthritis, unspecified site: Secondary | ICD-10-CM | POA: Diagnosis not present

## 2021-04-22 DIAGNOSIS — E261 Secondary hyperaldosteronism: Secondary | ICD-10-CM | POA: Diagnosis not present

## 2021-04-22 DIAGNOSIS — G4733 Obstructive sleep apnea (adult) (pediatric): Secondary | ICD-10-CM | POA: Diagnosis not present

## 2021-04-29 ENCOUNTER — Telehealth: Payer: Self-pay | Admitting: Podiatry

## 2021-04-29 DIAGNOSIS — Q666 Other congenital valgus deformities of feet: Secondary | ICD-10-CM | POA: Diagnosis not present

## 2021-04-29 DIAGNOSIS — M19079 Primary osteoarthritis, unspecified ankle and foot: Secondary | ICD-10-CM | POA: Diagnosis not present

## 2021-04-29 NOTE — Telephone Encounter (Signed)
Corrected orthotics in.. pt aware ok to pick up.Marland Kitchen

## 2021-05-06 DIAGNOSIS — M25511 Pain in right shoulder: Secondary | ICD-10-CM | POA: Diagnosis not present

## 2021-05-08 ENCOUNTER — Other Ambulatory Visit (HOSPITAL_COMMUNITY): Payer: Self-pay | Admitting: Cardiology

## 2021-05-20 ENCOUNTER — Ambulatory Visit (INDEPENDENT_AMBULATORY_CARE_PROVIDER_SITE_OTHER): Payer: Medicare HMO

## 2021-05-20 ENCOUNTER — Other Ambulatory Visit (HOSPITAL_COMMUNITY): Payer: Self-pay | Admitting: Cardiology

## 2021-05-20 DIAGNOSIS — I5022 Chronic systolic (congestive) heart failure: Secondary | ICD-10-CM | POA: Diagnosis not present

## 2021-05-20 DIAGNOSIS — Z95810 Presence of automatic (implantable) cardiac defibrillator: Secondary | ICD-10-CM | POA: Diagnosis not present

## 2021-05-22 DIAGNOSIS — M79672 Pain in left foot: Secondary | ICD-10-CM | POA: Diagnosis not present

## 2021-05-22 NOTE — Progress Notes (Signed)
EPIC Encounter for ICM Monitoring  Patient Name: [name] is a 70 y.o. male Date: 05/22/2021 Primary Care Physican: Seward Carol, MD Primary Cardiologist: Aundra Dubin Electrophysiologist: Allred Bi-V Pacing: >99%        03/11/2021 Weight: 274 lbs                                                          AT/AF Burden: 75% (taking Xarelto)    Spoke with patient and heart failure questions reviewed.  Pt asymptomatic for fluid accumulation.  He fell a couple of weeks ago hurt shoulder and today crushed nerves in feet when battery fell on it.   CorVue thoracic impedance suggesting normal fluid levels.   Prescribed: Torsemide 20 mg take 2 tablets (40 mg total) daily.  He reports [name] approved of him taking extra Torsemide when needed at 06/28/2020 OV. Klor Con 20 mEq take take 3 tablets daily  Spironolactone 25 mg take 1 tablet daily   Labs: 02/05/2021 Creatinine 1.50 11/30/2020 Creatinine 1.16, BUN 22, Potassium 4.8, Sodium 136, GFR >60 A complete set of results can be found in Results Review.   Recommendations: No changes and encouraged to call if experiencing any fluid symptoms.   Follow-up plan: ICM clinic phone appointment on 06/24/2021.   91 day device clinic remote transmission 06/03/2021.     EP/Cardiology Office Visits:  Recall 10/24/2020 for Dr Rayann Heman.  Recall 05/29/2021 with Dr. Aundra Dubin.   Copy of ICM check sent to Dr. Rayann Heman.      3 month ICM trend: 05/20/2021.    1 Year ICM trend:       Rosalene Billings, RN 05/22/2021 10:43 AM

## 2021-05-28 ENCOUNTER — Telehealth: Payer: Self-pay | Admitting: Podiatry

## 2021-05-28 NOTE — Telephone Encounter (Signed)
Pt is aware we are taking the charges out for the orthotics and said thank you and he will get the orthotics back to [place].

## 2021-05-28 NOTE — Telephone Encounter (Signed)
Pt left message stating the orthotics he got from [place] are not working he can only tolerate for a couple of hours and then it causes pain up his leg. He is wanting to package them up and bring them back. Fyi He has not paid for them as of last time because they have not worked for him.  Please advise?

## 2021-06-03 ENCOUNTER — Ambulatory Visit (INDEPENDENT_AMBULATORY_CARE_PROVIDER_SITE_OTHER): Payer: Medicare HMO

## 2021-06-03 DIAGNOSIS — I428 Other cardiomyopathies: Secondary | ICD-10-CM | POA: Diagnosis not present

## 2021-06-05 LAB — CUP PACEART REMOTE DEVICE CHECK
Battery Remaining Longevity: 64 mo
Battery Remaining Percentage: 72 %
Battery Voltage: 2.96 V
Date Time Interrogation Session: 20221123100120
HighPow Impedance: 79 Ohm
Implantable Lead Implant Date: 20210209
Implantable Lead Implant Date: 20210209
Implantable Lead Implant Date: 20210209
Implantable Lead Location: 753858
Implantable Lead Location: 753859
Implantable Lead Location: 753860
Implantable Pulse Generator Implant Date: 20210209
Lead Channel Impedance Value: 390 Ohm
Lead Channel Impedance Value: 430 Ohm
Lead Channel Impedance Value: 790 Ohm
Lead Channel Pacing Threshold Amplitude: 0.75 V
Lead Channel Pacing Threshold Amplitude: 2 V
Lead Channel Pacing Threshold Pulse Width: 0.5 ms
Lead Channel Pacing Threshold Pulse Width: 0.5 ms
Lead Channel Sensing Intrinsic Amplitude: 10.5 mV
Lead Channel Sensing Intrinsic Amplitude: 3.5 mV
Lead Channel Setting Pacing Amplitude: 2.5 V
Lead Channel Setting Pacing Amplitude: 2.75 V
Lead Channel Setting Pacing Pulse Width: 0.5 ms
Lead Channel Setting Pacing Pulse Width: 0.5 ms
Lead Channel Setting Sensing Sensitivity: 0.5 mV
Pulse Gen Serial Number: 111006442

## 2021-06-12 NOTE — Progress Notes (Signed)
Remote ICD transmission.   

## 2021-06-24 ENCOUNTER — Ambulatory Visit (INDEPENDENT_AMBULATORY_CARE_PROVIDER_SITE_OTHER): Payer: Medicare HMO

## 2021-06-24 DIAGNOSIS — I5022 Chronic systolic (congestive) heart failure: Secondary | ICD-10-CM | POA: Diagnosis not present

## 2021-06-24 DIAGNOSIS — Z95810 Presence of automatic (implantable) cardiac defibrillator: Secondary | ICD-10-CM

## 2021-06-26 ENCOUNTER — Other Ambulatory Visit (HOSPITAL_COMMUNITY): Payer: Self-pay | Admitting: Cardiology

## 2021-06-26 NOTE — Progress Notes (Signed)
EPIC Encounter for ICM Monitoring  Patient Name: [name] is a 70 y.o. male Date: 06/26/2021 Primary Care Physican: Seward Carol, MD Electrophysiologist: Allred Bi-V Pacing: >99%        03/11/2021 Weight: 274 lbs                                                          AT/AF Burden: 70% (taking Xarelto) on 12/5 report   Spoke with patient and heart failure questions reviewed.  Pt asymptomatic for fluid accumulation.  He was traveling during thanksgiving holiday and decreased impedance and is currently traveling again.  He is not able to take diuretic when traveling and eating foods that are not low sat.    CorVue thoracic impedance suggesting normal fluid levels but was suggesting possible fluids 11/20-12/1.   Prescribed: Torsemide 20 mg take 2 tablets (40 mg total) daily.  He reports [name] approved of him taking extra Torsemide when needed at 06/28/2020 OV. Klor Con 20 mEq take take 3 tablets daily  Spironolactone 25 mg take 1 tablet daily   Labs: 02/05/2021 Creatinine 1.50 11/30/2020 Creatinine 1.16, BUN 22, Potassium 4.8, Sodium 136, GFR >60 A complete set of results can be found in Results Review.   Recommendations: No changes and encouraged to call if experiencing any fluid symptoms.   Follow-up plan: ICM clinic phone appointment on 07/29/2021.   91 day device clinic remote transmission 09/02/2021.     EP/Cardiology Office Visits:  07/09/2021 for Dr Rayann Heman.  Recall 05/29/2021 with Dr. Aundra Dubin.   Copy of ICM check sent to Dr. Rayann Heman.       3 month ICM trend: 06/23/2021.    Rosalene Billings, RN 06/26/2021 4:43 PM

## 2021-07-04 DIAGNOSIS — G4733 Obstructive sleep apnea (adult) (pediatric): Secondary | ICD-10-CM | POA: Diagnosis not present

## 2021-07-09 ENCOUNTER — Other Ambulatory Visit: Payer: Self-pay

## 2021-07-09 ENCOUNTER — Ambulatory Visit: Payer: Medicare HMO | Admitting: Internal Medicine

## 2021-07-09 ENCOUNTER — Encounter: Payer: Self-pay | Admitting: Internal Medicine

## 2021-07-09 VITALS — BP 108/64 | HR 62 | Ht 70.0 in | Wt 285.0 lb

## 2021-07-09 DIAGNOSIS — I4819 Other persistent atrial fibrillation: Secondary | ICD-10-CM | POA: Diagnosis not present

## 2021-07-09 DIAGNOSIS — I5022 Chronic systolic (congestive) heart failure: Secondary | ICD-10-CM

## 2021-07-09 DIAGNOSIS — I442 Atrioventricular block, complete: Secondary | ICD-10-CM | POA: Diagnosis not present

## 2021-07-09 DIAGNOSIS — I10 Essential (primary) hypertension: Secondary | ICD-10-CM | POA: Diagnosis not present

## 2021-07-09 DIAGNOSIS — I428 Other cardiomyopathies: Secondary | ICD-10-CM

## 2021-07-09 LAB — BASIC METABOLIC PANEL
BUN/Creatinine Ratio: 23 (ref 10–24)
BUN: 29 mg/dL — ABNORMAL HIGH (ref 8–27)
CO2: 27 mmol/L (ref 20–29)
Calcium: 10 mg/dL (ref 8.6–10.2)
Chloride: 101 mmol/L (ref 96–106)
Creatinine, Ser: 1.27 mg/dL (ref 0.76–1.27)
Glucose: 76 mg/dL (ref 70–99)
Potassium: 4.5 mmol/L (ref 3.5–5.2)
Sodium: 141 mmol/L (ref 134–144)
eGFR: 62 mL/min/{1.73_m2} (ref >59)

## 2021-07-09 LAB — CBC
Hematocrit: 41.6 % (ref 37.5–51.0)
Hemoglobin: 14.3 g/dL (ref 13.0–17.7)
MCH: 28.9 pg (ref 26.6–33.0)
MCHC: 34.4 g/dL (ref 31.5–35.7)
MCV: 84 fL (ref 79–97)
Platelets: 162 10*3/uL (ref 150–450)
RBC: 4.95 x10E6/uL (ref 4.14–5.80)
RDW: 13.5 % (ref 11.6–15.4)
WBC: 10 10*3/uL (ref 3.4–10.8)

## 2021-07-09 LAB — CUP PACEART INCLINIC DEVICE CHECK
Battery Remaining Longevity: 55 mo
Brady Statistic RA Percent Paced: 0 %
Brady Statistic RV Percent Paced: 99.23 %
Date Time Interrogation Session: 20221227095659
HighPow Impedance: 77.625
Implantable Lead Implant Date: 20210209
Implantable Lead Implant Date: 20210209
Implantable Lead Implant Date: 20210209
Implantable Lead Location: 753858
Implantable Lead Location: 753859
Implantable Lead Location: 753860
Implantable Pulse Generator Implant Date: 20210209
Lead Channel Impedance Value: 387.5 Ohm
Lead Channel Impedance Value: 450 Ohm
Lead Channel Impedance Value: 825 Ohm
Lead Channel Pacing Threshold Amplitude: 0.75 V
Lead Channel Pacing Threshold Amplitude: 0.75 V
Lead Channel Pacing Threshold Amplitude: 0.75 V
Lead Channel Pacing Threshold Amplitude: 0.75 V
Lead Channel Pacing Threshold Amplitude: 1.25 V
Lead Channel Pacing Threshold Amplitude: 1.25 V
Lead Channel Pacing Threshold Pulse Width: 0.5 ms
Lead Channel Pacing Threshold Pulse Width: 0.5 ms
Lead Channel Pacing Threshold Pulse Width: 0.5 ms
Lead Channel Pacing Threshold Pulse Width: 0.5 ms
Lead Channel Pacing Threshold Pulse Width: 0.5 ms
Lead Channel Pacing Threshold Pulse Width: 0.5 ms
Lead Channel Sensing Intrinsic Amplitude: 10.5 mV
Lead Channel Sensing Intrinsic Amplitude: 2.9 mV
Lead Channel Setting Pacing Amplitude: 2 V
Lead Channel Setting Pacing Amplitude: 2.5 V
Lead Channel Setting Pacing Amplitude: 2.75 V
Lead Channel Setting Pacing Pulse Width: 0.5 ms
Lead Channel Setting Pacing Pulse Width: 0.5 ms
Lead Channel Setting Sensing Sensitivity: 0.5 mV
Pulse Gen Serial Number: 111006442

## 2021-07-09 LAB — PRO B NATRIURETIC PEPTIDE: NT-Pro BNP: 284 pg/mL (ref 0–376)

## 2021-07-09 MED ORDER — TORSEMIDE 20 MG PO TABS: 20.0000 mg | ORAL_TABLET | Freq: Every day | ORAL | 3 refills | Status: DC

## 2021-07-09 MED ORDER — POTASSIUM CHLORIDE CRYS ER 20 MEQ PO TBCR: 20.0000 meq | EXTENDED_RELEASE_TABLET | Freq: Every day | ORAL | 3 refills | Status: DC

## 2021-07-09 NOTE — Patient Instructions (Addendum)
Medication Instructions:  Reduce Torsemide 20 mg daily Reduce Potassium to 20 mEq daily  Your physician recommends that you continue on your current medications as directed. Please refer to the Current Medication list given to you today. *If you need a refill on your cardiac medications before your next appointment, please call your pharmacy*  Lab Work: BNP, BMP, CBC If you have labs (blood work) drawn today and your tests are completely normal, you will receive your results only by: [place] (if you have MyChart) OR A paper copy in the mail If you have any lab test that is abnormal or we need to change your treatment, we will call you to review the results.  Testing/Procedures: None.  Follow-Up: At [facility], you and your health needs are our priority.  As part of our continuing mission to provide you with exceptional heart care, we have created designated Provider Care Teams.  These Care Teams include your primary Cardiologist (physician) and Advanced Practice Providers (APPs -  Physician Assistants and Nurse Practitioners) who all work together to provide you with the care you need, when you need it.  Your physician wants you to follow-up in: 12 months with   one of the following Advanced Practice Providers on your designated Care Team:     [name], [place]   You will receive a reminder letter in the mail two months in advance. If you don't receive a letter, please call our office to schedule the follow-up appointment.  Remote monitoring is used to monitor your ICD from home. This monitoring reduces the number of office visits required to check your device to one time per year. It allows [place] to keep an eye on the functioning of your device to ensure it is working properly. You are scheduled for a device check from home on 07/29/21. You may send your transmission at any time that day. If you have a wireless device, the transmission will be sent automatically. After  your physician reviews your transmission, you will receive a postcard with your next transmission date.  We recommend signing up for the patient portal called "MyChart".  Sign up information is provided on this After Visit Summary.  MyChart is used to connect with patients for Virtual Visits (Telemedicine).  Patients are able to view lab/test results, encounter notes, upcoming appointments, etc.  Non-urgent messages can be sent to your provider as well.   To learn more about what you can do with MyChart, go to [email or web address].    Any Other Special Instructions Will Be Listed Below (If Applicable).

## 2021-07-09 NOTE — Progress Notes (Signed)
PCP: Seward Carol, MD Primary Cardiologist: [name] Primary EP: Dr Rayann Heman  [name] is a 70 y.o. male who presents today for routine electrophysiology followup.  Since last being seen in our clinic, the patient reports doing very well.  Today, he denies symptoms of palpitations, chest pain, shortness of breath,  lower extremity edema,  syncope, or ICD shocks.  The patient is otherwise without complaint today.   + postural dizziness.  Past Medical History:  Diagnosis Date   AICD (automatic cardioverter/defibrillator) present    Aortic insufficiency 04/21/2018   Aortic insufficiency    Arthritis    "joints" (09/26/2013)   Atrial enlargement, left    CHF (congestive heart failure) ([place])    Essential hypertension 11/28/2020   Hearing aid worn    both ears   Mitral regurgitation 04/21/2018   Obesity    OSA on CPAP    "mask adjusts to what I need" (09/26/2013)   Persistent atrial fibrillation (HCC)    cardioversion 06/06/13   PONV (postoperative nausea and vomiting)    Rotator cuff tear, right dec 2012   physical therapy done, decreased strength   S/P aortic valve repair 05/18/2018   Complex valvuloplasty including plication of right coronary leaflet and 21 mm Biostable HAART annuloplasty ring   S/P Maze operation for atrial fibrillation 05/18/2018   Complete bilateral atrial lesion set using bipolar radiofrequency and cryothermy ablation with clipping of LA appendage   S/P mitral valve repair 05/18/2018   Complex valvuloplasty including artificial Gore-tex neochord placement x4 and 30 mm Sorin Memo 4D ring annuloplasty   Wears glasses    Past Surgical History:  Procedure Laterality Date   AORTIC VALVE REPAIR N/A 05/18/2018   Procedure: AORTIC VALVE REPAIR using HAART 300 Aortic Annuloplasty Device size 79mm;  Surgeon: Rexene Alberts, MD;  Location: [place];  Service: Open Heart Surgery;  Laterality: N/A;   ATRIAL FIBRILLATION ABLATION N/A 04/26/2019   Procedure: ATRIAL  FIBRILLATION ABLATION;  Surgeon: Thompson Grayer, MD;  Location: [place] [facility];  Service: Cardiovascular;  Laterality: N/A;   AV NODE ABLATION N/A 12/20/2019   Procedure: AV NODE ABLATION;  Surgeon: Thompson Grayer, MD;  Location: [place] [facility];  Service: Cardiovascular;  Laterality: N/A;   BIV ICD INSERTION CRT-D N/A 08/23/2019   Procedure: BIV ICD INSERTION CRT-D;  Surgeon: Thompson Grayer, MD;  Location: [place] [facility];  Service: Cardiovascular;  Laterality: N/A;   CARDIOVERSION N/A 06/06/2013   Procedure: CARDIOVERSION;  Surgeon: Sanda Klein, MD;  Location: [facility];  Service: Cardiovascular;  Laterality: N/A;   CARDIOVERSION N/A 09/28/2013   Procedure: CARDIOVERSION BEDSIDE;  Surgeon: Peter M Martinique, MD;  Location: [place];  Service: Cardiovascular;  Laterality: N/A;   CARDIOVERSION N/A 11/30/2018   Procedure: CARDIOVERSION;  Surgeon: Dorothy Spark, MD;  Location: [facility];  Service: Cardiovascular;  Laterality: N/A;   CARDIOVERSION N/A 03/10/2019   Procedure: CARDIOVERSION;  Surgeon: [name], MD;  Location: [place];  Service: Endoscopy;  Laterality: N/A;   CARPAL TUNNEL RELEASE Right 08/08/2013   Procedure: RIGHT CARPAL TUNNEL RELEASE;  Surgeon: Wynonia Sours, MD;  Location: [place];  Service: Orthopedics;  Laterality: Right;   CARPAL TUNNEL RELEASE Left 2008   CLIPPING OF ATRIAL APPENDAGE  05/18/2018   Procedure: CLIPPING OF ATRIAL APPENDAGE using AtriCure Clip size 45;  Surgeon: Rexene Alberts, MD;  Location: [place];  Service: Open Heart Surgery;;   KNEE ARTHROSCOPY Right 1990's  MAZE N/A 05/18/2018   Procedure: MAZE;  Surgeon: Rexene Alberts, MD;  Location: [place];  Service: Open Heart Surgery;  Laterality: N/A;   MITRAL VALVE REPAIR N/A 05/18/2018   Procedure: MITRAL VALVE REPAIR (MVR) using 4D Memo Ring size 30;  Surgeon: Rexene Alberts, MD;  Location: [place];  Service: Open Heart Surgery;  Laterality: N/A;   RIGHT/LEFT HEART CATH  AND CORONARY ANGIOGRAPHY N/A 04/21/2018   Procedure: RIGHT/LEFT HEART CATH AND CORONARY ANGIOGRAPHY;  Surgeon: Nelva Bush, MD;  Location: [place] [facility];  Service: Cardiovascular;  Laterality: N/A;   SHOULDER OPEN ROTATOR CUFF REPAIR Left 1990's   SHOULDER OPEN ROTATOR CUFF REPAIR Right 11/2007   TEE WITHOUT CARDIOVERSION N/A 04/21/2018   Procedure: TRANSESOPHAGEAL ECHOCARDIOGRAM (TEE);  Surgeon: Jerline Pain, MD;  Location: [facility];  Service: Cardiovascular;  Laterality: N/A;   TEE WITHOUT CARDIOVERSION N/A 05/18/2018   Procedure: TRANSESOPHAGEAL ECHOCARDIOGRAM (TEE);  Surgeon: Rexene Alberts, MD;  Location: [place];  Service: Open Heart Surgery;  Laterality: N/A;   TONSILLECTOMY  1950's   TOTAL HIP ARTHROPLASTY Left 2010   TOTAL HIP ARTHROPLASTY Right 05/29/2020   Procedure: TOTAL HIP ARTHROPLASTY ANTERIOR APPROACH;  Surgeon: Renette Butters, MD;  Location: [facility];  Service: Orthopedics;  Laterality: Right;   TOTAL HIP REVISION Left 10/08/2011   TOTAL HIP REVISION  04/09/2012   Procedure: TOTAL HIP REVISION;  Surgeon: Gearlean Alf, MD;  Location: [facility];  Service: Orthopedics;  Laterality: Left;  Left Hip Acetabular Revision vs Constrained Liner   TOTAL KNEE ARTHROPLASTY Right 2006    TRIGGER FINGER RELEASE Right 08/08/2013   Procedure: RELEASE STENOSING TENOSYNOVITIS RIGHT THUMB;  Surgeon: Wynonia Sours, MD;  Location: [place];  Service: Orthopedics;  Laterality: Right;    ROS- all systems are reviewed and negative except as per HPI above  Current Outpatient Medications  Medication Sig Dispense Refill   acetaminophen (TYLENOL) 325 MG tablet Take 2 tablets (650 mg total) by mouth every 4 (four) hours as needed for headache or mild pain.     Ascorbic Acid (VITAMIN C) 1000 MG tablet Take 1,000 mg by mouth daily.     carvedilol (COREG) 6.25 MG tablet TAKE 1 TABLET BY MOUTH TWICE A DAY 180 tablet 3   clobetasol ointment (TEMOVATE) 6.50 % Apply 1 application  topically daily as needed (rash).     diclofenac Sodium (VOLTAREN) 1 % GEL Apply 2 g topically daily as needed (pain).      HYDROcodone-acetaminophen (NORCO/VICODIN) 5-325 MG tablet Take 1 tablet by mouth every 8 (eight) hours as needed.     ivermectin (STROMECTOL) 3 MG TABS tablet Take by mouth as needed.     KLOR-CON M20 20 MEQ tablet TAKE 3 TABLETS BY MOUTH EVERY DAY 270 tablet 0   MAGNESIUM PO Take 1 tablet by mouth daily.     Multiple Vitamin (MULTIVITAMIN WITH MINERALS) TABS tablet Take 1 tablet by mouth daily.      Naproxen Sod-diphenhydrAMINE 220-25 MG TABS Take 2 tablets by mouth at bedtime as needed (sleep/pain).     Probiotic Product (PROBIOTIC PO) Take 1 capsule by mouth daily.     sacubitril-valsartan (ENTRESTO) 24-26 MG Take 1 tablet by mouth 2 (two) times daily. 90 tablet 3   spironolactone (ALDACTONE) 25 MG tablet TAKE 1 TABLET BY MOUTH EVERY DAY 90 tablet 1   tadalafil (CIALIS) 10 MG tablet Take 10 mg by mouth daily as needed.     torsemide (  DEMADEX) 20 MG tablet Take 2 tablets (40 mg total) by mouth daily. Needs appt 180 tablet 2   XARELTO 20 MG TABS tablet TAKE ONE TABLET BY MOUTH DAILY WITH SUPPER 30 tablet 5   zinc gluconate 50 MG tablet Take 50 mg by mouth daily.     No current facility-administered medications for this visit.    Physical Exam: Vitals:   07/09/21 0922  BP: 108/64  Pulse: 62  SpO2: 93%  Weight: 285 lb (129.3 kg)  Height: 5\' 10"  (1.778 m)    GEN- The patient is well appearing, alert and oriented x 3 today.   Head- normocephalic, atraumatic Eyes-  Sclera clear, conjunctiva pink Ears- hearing intact Oropharynx- clear Lungs- Clear to ausculation bilaterally, normal work of breathing Chest- ICD pocket is well healed Heart- Regular rate and rhythm, no murmurs, rubs or gallops, PMI not laterally displaced GI- soft, NT, ND, + BS Extremities- no clubbing, cyanosis, or edema  ICD interrogation- reviewed in detail today,  See PACEART report  ekg  tracing ordered today is personally reviewed and shows sinus with AV dissociation due to VVIR programming  Wt Readings from Last 3 Encounters:  07/09/21 285 lb (129.3 kg)  11/30/20 279 lb 3.2 oz (126.6 kg)  08/06/20 262 lb (118.8 kg)    Assessment and Plan:  1.  Chronic systolic dysfunction/ complete heart block s/p AV nodal ablation Dry appearing today Reduce torsemide to 20mg  daily Reduce KDur to 20 meq daily Bmet, bnp ordered today Ef has recovered s/p AV nodal ablation (echo 10/21 reviewed) Stable on an appropriate medical regimen Normal ICD function See Claudia Desanctis Art report He is in sinus.  I have reprogrammed today from VVIR to DDDR to promote intrinsic conduction. he is device dependant today followed in ICM device clinic  2. Persistent afib S/p AV nodal ablation He has been in sinus since 05/14/21.  I will reprogram from VVIR to DDDR today S/p LAA clip and MAZE He is on xarelto Cbc and bmet ordered today  3. HTN Stable No change required today 11/30/20 labs reviewed  Risks, benefits and potential toxicities for medications prescribed and/or refilled reviewed with patient t oday.   Return to see EP APP in a year  Thompson Grayer MD, [facility] 07/09/2021 9:39 AM

## 2021-07-22 ENCOUNTER — Other Ambulatory Visit: Payer: Self-pay | Admitting: Internal Medicine

## 2021-07-22 NOTE — Telephone Encounter (Signed)
Pt last saw Dr Rayann Heman 07/09/21, last labs 07/09/21 Creat 1.27, age 71, weight 129.3kg, CrCl 101.81, based on CrCl pt is on appropriate dosage of Xarelto 20mg  QD for afib.  Will refill rx.

## 2021-07-25 DIAGNOSIS — I480 Paroxysmal atrial fibrillation: Secondary | ICD-10-CM | POA: Diagnosis not present

## 2021-07-25 DIAGNOSIS — Z0000 Encounter for general adult medical examination without abnormal findings: Secondary | ICD-10-CM | POA: Diagnosis not present

## 2021-07-25 DIAGNOSIS — G4730 Sleep apnea, unspecified: Secondary | ICD-10-CM | POA: Diagnosis not present

## 2021-07-25 DIAGNOSIS — I5022 Chronic systolic (congestive) heart failure: Secondary | ICD-10-CM | POA: Diagnosis not present

## 2021-07-25 DIAGNOSIS — Z952 Presence of prosthetic heart valve: Secondary | ICD-10-CM | POA: Diagnosis not present

## 2021-07-25 DIAGNOSIS — I10 Essential (primary) hypertension: Secondary | ICD-10-CM | POA: Diagnosis not present

## 2021-07-25 DIAGNOSIS — Z125 Encounter for screening for malignant neoplasm of prostate: Secondary | ICD-10-CM | POA: Diagnosis not present

## 2021-07-29 ENCOUNTER — Ambulatory Visit (INDEPENDENT_AMBULATORY_CARE_PROVIDER_SITE_OTHER): Payer: Medicare HMO

## 2021-07-29 DIAGNOSIS — I5022 Chronic systolic (congestive) heart failure: Secondary | ICD-10-CM

## 2021-07-29 DIAGNOSIS — Z95810 Presence of automatic (implantable) cardiac defibrillator: Secondary | ICD-10-CM | POA: Diagnosis not present

## 2021-07-31 ENCOUNTER — Telehealth: Payer: Self-pay

## 2021-07-31 NOTE — Telephone Encounter (Signed)
Remote ICM transmission received.  Attempted call to patient regarding ICM remote transmission and left detailed message per DPR.  Advised to return call for any fluid symptoms or questions. Next ICM remote transmission scheduled 09/03/2021.

## 2021-07-31 NOTE — Progress Notes (Addendum)
EPIC Encounter for ICM Monitoring  Patient Name: [name] is a 71 y.o. male Date: 07/31/2021 Primary Care Physican: Seward Carol, MD Primary Cardiologist: Aundra Dubin Electrophysiologist: Allred Bi-V Pacing: >99%        07/09/2021 Weight: 285 lbs                                                          AT/AF Burden: <1% (taking Xarelto)    Attempted call to patient and unable to reach.  Left detailed message per DPR regarding transmission. Transmission reviewed.    CorVue thoracic impedance normal fluid but was suggesting possible fluid accumulation from 1/8/-1/12.   Prescribed: Torsemide 20 mg take 1 tablets (20 mg total) daily.   Klor Con 20 mEq take take 1 tablet daily  Spironolactone 25 mg take 1 tablet daily   Labs: 02/05/2021 Creatinine 1.50 11/30/2020 Creatinine 1.16, BUN 22, Potassium 4.8, Sodium 136, GFR >60 A complete set of results can be found in Results Review.   Recommendations: Left voice mail with ICM number and encouraged to call if experiencing any fluid symptoms.   Follow-up plan: ICM clinic phone appointment on 09/03/2021.   91 day device clinic remote transmission 09/02/2021.     EP/Cardiology Office Visits:  Recall 07/04/2022 with Oda Kilts, PA.  Recall 05/29/2021 with Dr. Aundra Dubin.   Copy of ICM check sent to Dr. Rayann Heman.     3 month ICM trend: 07/29/2021.    12-14 Month ICM trend:     Rosalene Billings, RN 07/31/2021 9:38 AM

## 2021-07-31 NOTE — Progress Notes (Addendum)
Spoke with patient and heart failure questions reviewed.  Pt asymptomatic for fluid accumulation.  Reports feeling well at this time and voices no complaints. He reports Torsemide was decreased at last OV and he ate foods that may have been high in salt.  Advise to limit salt and fluid intake.  He will take extra 20 mg Torsemide if he feels he has fluid.

## 2021-08-04 DIAGNOSIS — G4733 Obstructive sleep apnea (adult) (pediatric): Secondary | ICD-10-CM | POA: Diagnosis not present

## 2021-08-14 ENCOUNTER — Other Ambulatory Visit (HOSPITAL_COMMUNITY): Payer: Self-pay | Admitting: Cardiology

## 2021-09-02 ENCOUNTER — Ambulatory Visit (INDEPENDENT_AMBULATORY_CARE_PROVIDER_SITE_OTHER): Payer: Medicare HMO

## 2021-09-02 DIAGNOSIS — I442 Atrioventricular block, complete: Secondary | ICD-10-CM

## 2021-09-02 LAB — CUP PACEART REMOTE DEVICE CHECK
Battery Remaining Longevity: 60 mo
Battery Remaining Percentage: 69 %
Battery Voltage: 2.96 V
Brady Statistic AP VP Percent: 98 %
Brady Statistic AP VS Percent: 1 %
Brady Statistic AS VP Percent: 1.3 %
Brady Statistic AS VS Percent: 1 %
Brady Statistic RA Percent Paced: 98 %
Date Time Interrogation Session: 20230220021241
HighPow Impedance: 74 Ohm
Implantable Lead Implant Date: 20210209
Implantable Lead Implant Date: 20210209
Implantable Lead Implant Date: 20210209
Implantable Lead Location: 753858
Implantable Lead Location: 753859
Implantable Lead Location: 753860
Implantable Pulse Generator Implant Date: 20210209
Lead Channel Impedance Value: 380 Ohm
Lead Channel Impedance Value: 440 Ohm
Lead Channel Impedance Value: 840 Ohm
Lead Channel Pacing Threshold Amplitude: 0.75 V
Lead Channel Pacing Threshold Amplitude: 0.75 V
Lead Channel Pacing Threshold Amplitude: 1.25 V
Lead Channel Pacing Threshold Pulse Width: 0.5 ms
Lead Channel Pacing Threshold Pulse Width: 0.5 ms
Lead Channel Pacing Threshold Pulse Width: 0.5 ms
Lead Channel Sensing Intrinsic Amplitude: 12 mV
Lead Channel Sensing Intrinsic Amplitude: 3 mV
Lead Channel Setting Pacing Amplitude: 2 V
Lead Channel Setting Pacing Amplitude: 2.5 V
Lead Channel Setting Pacing Amplitude: 2.5 V
Lead Channel Setting Pacing Pulse Width: 0.5 ms
Lead Channel Setting Pacing Pulse Width: 0.5 ms
Lead Channel Setting Sensing Sensitivity: 0.5 mV
Pulse Gen Serial Number: 111006442

## 2021-09-03 ENCOUNTER — Ambulatory Visit (INDEPENDENT_AMBULATORY_CARE_PROVIDER_SITE_OTHER): Payer: Medicare HMO

## 2021-09-03 DIAGNOSIS — I5022 Chronic systolic (congestive) heart failure: Secondary | ICD-10-CM

## 2021-09-03 DIAGNOSIS — Z95810 Presence of automatic (implantable) cardiac defibrillator: Secondary | ICD-10-CM

## 2021-09-04 DIAGNOSIS — G4733 Obstructive sleep apnea (adult) (pediatric): Secondary | ICD-10-CM | POA: Diagnosis not present

## 2021-09-06 ENCOUNTER — Other Ambulatory Visit: Payer: Self-pay | Admitting: Cardiology

## 2021-09-06 DIAGNOSIS — I5022 Chronic systolic (congestive) heart failure: Secondary | ICD-10-CM

## 2021-09-06 DIAGNOSIS — I484 Atypical atrial flutter: Secondary | ICD-10-CM

## 2021-09-06 NOTE — Progress Notes (Signed)
Remote ICD transmission.   

## 2021-09-06 NOTE — Progress Notes (Signed)
EPIC Encounter for ICM Monitoring  Patient Name: ARSENIO SCHNORR is a 71 y.o. male Date: 09/06/2021 Primary Care Physican: Seward Carol, MD Primary Cardiologist: Aundra Dubin Electrophysiologist: Allred Bi-V Pacing: >99%        07/09/2021 Weight: 285 lbs 09/06/2021 Weight: 273 lbs                                                          AT/AF Burden: <1% (taking Xarelto)    Spoke with patient and heart failure questions reviewed.  Pt asymptomatic for fluid accumulation.  He has taken some Motrin due to severe pain in feet and hands due to arthritis.  Discussed limiting salt and fluid intake.  He reports dizziness over the last couple of days and advised may be related to dehydration.    CorVue thoracic impedance normal but was suggesting possible fluid accumulation from 1/27-2/12.  Impedance suggesting possible dryness 2/13-2/19.   Prescribed: Torsemide 20 mg take 1 tablet (20 mg total) daily.   Klor Con 20 mEq take take 1 tablet daily  Spironolactone 25 mg take 1 tablet daily   Labs: 07/09/2021 Creatinine 1.27, BUN 29, Potassium 4.5, Sodium 141, GFR 62 02/05/2021 Creatinine 1.50 11/30/2020 Creatinine 1.16, BUN 22, Potassium 4.8, Sodium 136, GFR >60 A complete set of results can be found in Results Review.   Recommendations:  Recommendation to limit salt intake to 2000 mg daily and fluid intake to 64 oz daily.  Encouraged to call if experiencing any fluid symptoms.  Advised to avoid NSAIDs.     Follow-up plan: ICM clinic phone appointment on 09/16/2021 to recheck fluid levels.   91 day device clinic remote transmission 12/02/2021.     EP/Cardiology Office Visits:  Recall 07/04/2022 with Oda Kilts, PA.   Recall 05/29/2021 with Dr. Aundra Dubin and advised to call to schedule appointmentt.   Copy of ICM check sent to Dr. Rayann Heman.  3 month ICM trend: 09/03/2021.    12-14 Month ICM trend:     Rosalene Billings, RN 09/06/2021 4:09 PM

## 2021-09-16 ENCOUNTER — Ambulatory Visit (INDEPENDENT_AMBULATORY_CARE_PROVIDER_SITE_OTHER): Payer: Medicare HMO

## 2021-09-16 DIAGNOSIS — M25551 Pain in right hip: Secondary | ICD-10-CM | POA: Diagnosis not present

## 2021-09-16 DIAGNOSIS — M545 Low back pain, unspecified: Secondary | ICD-10-CM | POA: Diagnosis not present

## 2021-09-16 DIAGNOSIS — Z95810 Presence of automatic (implantable) cardiac defibrillator: Secondary | ICD-10-CM

## 2021-09-16 DIAGNOSIS — I5022 Chronic systolic (congestive) heart failure: Secondary | ICD-10-CM

## 2021-09-16 DIAGNOSIS — M25562 Pain in left knee: Secondary | ICD-10-CM | POA: Diagnosis not present

## 2021-09-18 ENCOUNTER — Telehealth: Payer: Self-pay

## 2021-09-18 NOTE — Telephone Encounter (Signed)
Remote ICM transmission received.  Attempted call to patient regarding ICM remote transmission and left detailed message per DPR.  Advised to return call for any fluid symptoms or questions. Next ICM remote transmission scheduled 09/30/2021.   ° ° °

## 2021-09-18 NOTE — Progress Notes (Signed)
EPIC Encounter for ICM Monitoring ? ?Patient Name: [name] is a 71 y.o. male ?Date: 09/18/2021 ?Primary Care Physican: Seward Carol, MD ?Primary Cardiologist: Aundra Dubin ?Electrophysiologist: Allred ?Bi-V Pacing: >99%        ?07/09/2021 Weight: 285 lbs ?09/06/2021 Weight: 273 lbs ?                                                         ?AT/AF Burden: <1% (taking Xarelto)  ?  ?Attempted call to patient and unable to reach.  Left detailed message per DPR regarding transmission. Transmission reviewed.  ?  ?CorVue thoracic impedance was suggesting possible fluid accumulation from 2/24-3/5 but trending above baseline suggesting possible dryness on transmission date.  Impedance suggesting alternating pattern of possible fluid accumulation and dryness. ?  ?Prescribed: ?Torsemide 20 mg take 1 tablet (20 mg total) daily.   ?Klor Con 20 mEq take take 1 tablet daily  ?Spironolactone 25 mg take 1 tablet daily ?  ?Labs: ?07/09/2021 Creatinine 1.27, BUN 29, Potassium 4.5, Sodium 141, GFR 62 ?02/05/2021 Creatinine 1.50 ?11/30/2020 Creatinine 1.16, BUN 22, Potassium 4.8, Sodium 136, GFR >60 ?A complete set of results can be found in Results Review. ?  ?Recommendations:  Left voice mail with ICM number and encouraged to call if experiencing any fluid symptoms.   ?  ?Follow-up plan: ICM clinic phone appointment on 09/30/2021 to recheck fluid levels.   91 day device clinic remote transmission 12/02/2021.   ?  ?EP/Cardiology Office Visits:  Recall 07/04/2022 with Oda Kilts, PA.   Recall 05/29/2021 with Dr. Aundra Dubin and has been advised to call to schedule appointmentt. ?  ?Copy of ICM check sent to Dr. Rayann Heman. Will send to [name] for review if patient is reached. ? ?3 month ICM trend: 09/16/2021. ? ? ? ?12-14 Month ICM trend:  ? ? ? ?Rosalene Billings, RN ?09/18/2021 ?3:08 PM ? ?

## 2021-09-23 NOTE — Progress Notes (Signed)
Spoke with patient and heart failure questions reviewed.  Pt asymptomatic for fluid accumulation.  He has reported using an Sales executive when [place] but he felt so bad after a couple of days he has stopped.  He was told not to use a chain saw due to heavy vibration and decided the sander was too much vibration as well.  He feels fine as far as no fluid symptoms.  Advised will recheck fluid levels on 3/20. ?

## 2021-09-25 DIAGNOSIS — S76211A Strain of adductor muscle, fascia and tendon of right thigh, initial encounter: Secondary | ICD-10-CM | POA: Diagnosis not present

## 2021-09-25 DIAGNOSIS — M6281 Muscle weakness (generalized): Secondary | ICD-10-CM | POA: Diagnosis not present

## 2021-09-30 ENCOUNTER — Ambulatory Visit (INDEPENDENT_AMBULATORY_CARE_PROVIDER_SITE_OTHER): Payer: Medicare HMO

## 2021-09-30 DIAGNOSIS — Z95810 Presence of automatic (implantable) cardiac defibrillator: Secondary | ICD-10-CM

## 2021-09-30 DIAGNOSIS — I5022 Chronic systolic (congestive) heart failure: Secondary | ICD-10-CM

## 2021-09-30 NOTE — Progress Notes (Signed)
EPIC Encounter for ICM Monitoring ? ?Patient Name: [name] is a 71 y.o. male ?Date: 09/30/2021 ?Primary Care Physican: Seward Carol, MD ?Primary Cardiologist: Aundra Dubin ?Electrophysiologist: Allred ?Bi-V Pacing: >99%        ?07/09/2021 Weight: 285 lbs ?09/06/2021 Weight: 273 lbs ?                                                         ?AT/AF Burden: <1% (taking Xarelto)  ?  ?Spoke with patient and heart failure questions reviewed.  Pt asymptomatic for fluid accumulation.  Reports feeling well at this time and voices no complaints.  ?  ?CorVue thoracic impedance was suggesting possible return of fluid accumulation starting 3/19.  Impedance was trending above baseline suggesting possible dryness 3/5-3/16.  Impedance suggesting alternating pattern of possible fluid accumulation and dryness. ?  ?Prescribed: ?Torsemide 20 mg take 1 tablet (20 mg total) daily.   ?Klor Con 20 mEq take take 1 tablet daily  ?Spironolactone 25 mg take 1 tablet daily ?  ?Labs: ?07/09/2021 Creatinine 1.27, BUN 29, Potassium 4.5, Sodium 141, GFR 62 ?02/05/2021 Creatinine 1.50 ?11/30/2020 Creatinine 1.16, BUN 22, Potassium 4.8, Sodium 136, GFR >60 ?A complete set of results can be found in Results Review. ?  ?Recommendations:  Recommendation to limit salt intake to 2000 mg daily and fluid intake to 64 oz daily.  Encouraged to call if experiencing any fluid symptoms.   He is very strict with salt and fluid intake.  ?  ?Follow-up plan: ICM clinic phone appointment on 10/14/2021.   91 day device clinic remote transmission 12/02/2021.   ?  ?EP/Cardiology Office Visits:  Recall 07/04/2022 with Oda Kilts, [place].   10/30/2021 with Dr. Aundra Dubin. ?  ?Copy of ICM check sent to Dr. Rayann Heman.  ? ?3 month ICM trend: 09/30/2021. ? ? ? ?12-14 Month ICM trend:  ? ? ? ?Rosalene Billings, RN ?09/30/2021 ?9:41 AM ? ?

## 2021-10-01 ENCOUNTER — Other Ambulatory Visit: Payer: Self-pay | Admitting: Cardiology

## 2021-10-01 DIAGNOSIS — I5022 Chronic systolic (congestive) heart failure: Secondary | ICD-10-CM

## 2021-10-01 DIAGNOSIS — I484 Atypical atrial flutter: Secondary | ICD-10-CM

## 2021-10-02 DIAGNOSIS — S76211A Strain of adductor muscle, fascia and tendon of right thigh, initial encounter: Secondary | ICD-10-CM | POA: Diagnosis not present

## 2021-10-02 DIAGNOSIS — M6281 Muscle weakness (generalized): Secondary | ICD-10-CM | POA: Diagnosis not present

## 2021-10-10 ENCOUNTER — Other Ambulatory Visit (HOSPITAL_COMMUNITY): Payer: Self-pay | Admitting: Cardiology

## 2021-10-14 ENCOUNTER — Ambulatory Visit (INDEPENDENT_AMBULATORY_CARE_PROVIDER_SITE_OTHER): Payer: Medicare HMO

## 2021-10-14 DIAGNOSIS — I5022 Chronic systolic (congestive) heart failure: Secondary | ICD-10-CM

## 2021-10-14 DIAGNOSIS — Z95810 Presence of automatic (implantable) cardiac defibrillator: Secondary | ICD-10-CM

## 2021-10-14 NOTE — Progress Notes (Signed)
EPIC Encounter for ICM Monitoring ? ?Patient Name: [name] is a 71 y.o. male ?Date: 10/14/2021 ?Primary Care Physican: Seward Carol, MD ?Primary Cardiologist: Aundra Dubin ?Electrophysiologist: Allred ?Bi-V Pacing: >99%        ?07/09/2021 Weight: 285 lbs ?09/06/2021 Weight: 273 lbs ?10/14/2021 Weight: 272 lbs ?                                                         ?AT/AF Burden: <1% (taking Xarelto)  ?  ?Spoke with patient and heart failure questions reviewed.  Pt asymptomatic for fluid accumulation.  Reports feeling well at this time and voices no complaints.  He ate [place] food this past weekend which may be related to start of decreased impedance.   ?  ?CorVue thoracic impedance was suggesting possible fluid accumulation starting 4/2. ?  ?Prescribed: ?Torsemide 20 mg take 1 tablet (20 mg total) daily.   ?Klor Con 20 mEq take take 1 tablet daily  ?Spironolactone 25 mg take 1 tablet daily ?  ?Labs: ?07/09/2021 Creatinine 1.27, BUN 29, Potassium 4.5, Sodium 141, GFR 62 ?02/05/2021 Creatinine 1.50 ?11/30/2020 Creatinine 1.16, BUN 22, Potassium 4.8, Sodium 136, GFR >60 ?A complete set of results can be found in Results Review. ?  ?Recommendations:  Advised to limit salt intake and will recheck fluid levels.  ?  ?Follow-up plan: ICM clinic phone appointment on 10/21/2021 to recheck fluid levels.   91 day device clinic remote transmission 12/02/2021.   ?  ?EP/Cardiology Office Visits:  Recall 07/04/2022 with Oda Kilts, [place].   10/30/2021 with Dr. Aundra Dubin. ?  ?Copy of ICM check sent to Dr. Rayann Heman.  ? ?3 month ICM trend: 10/14/2021. ? ? ? ?12-14 Month ICM trend:  ? ? ? ?Rosalene Billings, RN ?10/14/2021 ?8:04 AM ? ?

## 2021-10-21 ENCOUNTER — Ambulatory Visit (INDEPENDENT_AMBULATORY_CARE_PROVIDER_SITE_OTHER): Payer: Medicare HMO

## 2021-10-21 DIAGNOSIS — I5022 Chronic systolic (congestive) heart failure: Secondary | ICD-10-CM

## 2021-10-21 DIAGNOSIS — Z95810 Presence of automatic (implantable) cardiac defibrillator: Secondary | ICD-10-CM

## 2021-10-22 ENCOUNTER — Telehealth: Payer: Self-pay

## 2021-10-22 NOTE — Progress Notes (Signed)
EPIC Encounter for ICM Monitoring ? ?Patient Name: [name] is a 71 y.o. male ?Date: 10/22/2021 ?Primary Care Physican: Seward Carol, MD ?Primary Cardiologist: Aundra Dubin ?Electrophysiologist: Allred ?Bi-V Pacing: >99%        ?07/09/2021 Weight: 285 lbs ?09/06/2021 Weight: 273 lbs ?10/14/2021 Weight: 272 lbs ?                                                         ?AT/AF Burden: <1% (taking Xarelto)  ?  ?Attempted call to patient and unable to reach.  Left detailed message per DPR regarding transmission. Transmission reviewed.  ?  ?CorVue thoracic impedance suggesting fluid levels returned to normal. ?  ?Prescribed: ?Torsemide 20 mg take 1 tablet (20 mg total) daily.   ?Klor Con 20 mEq take take 1 tablet daily  ?Spironolactone 25 mg take 1 tablet daily ?  ?Labs: ?07/09/2021 Creatinine 1.27, BUN 29, Potassium 4.5, Sodium 141, GFR 62 ?02/05/2021 Creatinine 1.50 ?11/30/2020 Creatinine 1.16, BUN 22, Potassium 4.8, Sodium 136, GFR >60 ?A complete set of results can be found in Results Review. ?  ?Recommendations:  Left voice mail with ICM number and encouraged to call if experiencing any fluid symptoms. ?  ?Follow-up plan: ICM clinic phone appointment on 11/18/2021.   91 day device clinic remote transmission 12/02/2021.   ?  ?EP/Cardiology Office Visits:  Recall 07/04/2022 with Oda Kilts, [place].   10/30/2021 with Dr. Aundra Dubin. ?  ?Copy of ICM check sent to Dr. Rayann Heman.  ? ?3 month ICM trend: 10/21/2021. ? ? ? ?12-14 Month ICM trend:  ? ? ? ?Rosalene Billings, RN ?10/22/2021 ?2:53 PM ? ?

## 2021-10-22 NOTE — Telephone Encounter (Signed)
Remote ICM transmission received.  Attempted call to patient regarding ICM remote transmission and left detailed message per DPR.  Advised to return call for any fluid symptoms or questions. Next ICM remote transmission scheduled 11/18/2021.   ? ?

## 2021-10-28 DIAGNOSIS — M25562 Pain in left knee: Secondary | ICD-10-CM | POA: Diagnosis not present

## 2021-10-28 DIAGNOSIS — M25551 Pain in right hip: Secondary | ICD-10-CM | POA: Diagnosis not present

## 2021-10-30 ENCOUNTER — Encounter (HOSPITAL_COMMUNITY): Payer: Medicare HMO | Admitting: Cardiology

## 2021-11-14 ENCOUNTER — Other Ambulatory Visit (HOSPITAL_COMMUNITY): Payer: Self-pay | Admitting: Cardiology

## 2021-11-18 ENCOUNTER — Ambulatory Visit (INDEPENDENT_AMBULATORY_CARE_PROVIDER_SITE_OTHER): Payer: Medicare HMO

## 2021-11-18 DIAGNOSIS — I5022 Chronic systolic (congestive) heart failure: Secondary | ICD-10-CM | POA: Diagnosis not present

## 2021-11-18 DIAGNOSIS — Z95810 Presence of automatic (implantable) cardiac defibrillator: Secondary | ICD-10-CM

## 2021-11-18 NOTE — Progress Notes (Addendum)
EPIC Encounter for ICM Monitoring  Patient Name: [name] is a 71 y.o. male Date: 11/18/2021 Primary Care Physican: Seward Carol, MD Primary Cardiologist: Aundra Dubin Electrophysiologist: Allred Bi-V Pacing: >99%        07/09/2021 Weight: 285 lbs 09/06/2021 Weight: 273 lbs 10/14/2021 Weight: 272 lbs 11/17/2021 Weight: 277 lbs 11/19/2021 Weight: 272 lbs                                                           AT/AF Burden:  increased from <1% (taking Xarelto) to 8.6%   Spoke with patient and heart failure questions reviewed.  Pt reported 5 lbs weight gain on 5/7 and took extra 20 mg Torsemide and weight returned to baseline today.  He ate pizza over the weekend and skipped diuretic dosage on Sunday 5/7.   CorVue thoracic impedance suggesting possible fluid accumulation starting 5/7.  Fluid levels improving after taking extra Torsemide on 5/8.  AT/AF showing possible change on 4/27 and burden increase 8.6%.   Prescribed: Torsemide 20 mg take 1 tablet (20 mg total) daily.   Klor Con 20 mEq take take 1 tablet daily  Spironolactone 25 mg take 1 tablet daily   Labs: 07/09/2021 Creatinine 1.27, BUN 29, Potassium 4.5, Sodium 141, GFR 62 02/05/2021 Creatinine 1.50 11/30/2020 Creatinine 1.16, BUN 22, Potassium 4.8, Sodium 136, GFR >60 A complete set of results can be found in Results Review.   Recommendations:  He will take extra Torsemide 20 mg tomorrow and limit salt intake.   Follow-up plan: ICM clinic phone appointment on 11/25/2021 to recheck fluid levels.   91 day device clinic remote transmission 12/02/2021.     EP/Cardiology Office Visits:  Recall 07/04/2022 with Oda Kilts, [place].   01/17/2022 with Dr. Aundra Dubin.   Copy of ICM check sent to Dr. Rayann Heman.   11/19/2021 Updated report after taking extra Torsemide    3 month ICM trend: 11/18/2021.    12-14 Month ICM trend:     Rosalene Billings, RN 11/18/2021 8:11 AM     Addendum: He has done remarkably well from an arrhythmia  standpoint.  Now back in afib.  We will follow closely.

## 2021-11-19 ENCOUNTER — Telehealth: Payer: Self-pay

## 2021-11-19 NOTE — Telephone Encounter (Signed)
Remote ICM transmission received.  Attempted call to patient regarding ICM remote transmission and left detailed message per DPR.  Advised to return call for any fluid symptoms or questions. Next ICM remote transmission scheduled 11/25/2021.   ? ?

## 2021-11-25 ENCOUNTER — Ambulatory Visit (INDEPENDENT_AMBULATORY_CARE_PROVIDER_SITE_OTHER): Payer: Medicare HMO

## 2021-11-25 ENCOUNTER — Telehealth: Payer: Self-pay

## 2021-11-25 DIAGNOSIS — I5022 Chronic systolic (congestive) heart failure: Secondary | ICD-10-CM

## 2021-11-25 DIAGNOSIS — Z95810 Presence of automatic (implantable) cardiac defibrillator: Secondary | ICD-10-CM

## 2021-11-25 NOTE — Progress Notes (Signed)
Spoke with patient and heart failure questions reviewed.  Pt asymptomatic for fluid accumulation and feels fine.  He has not changed his diet over the weekend and taking Torsemide 20 mg daily.  He is unsure what is causing the fluid accumulation.  Advised will send copy to [name] for review and recommendations.  Weight remains stable at 272 lbs and denies any fluid symptoms.  Will recheck fluid levels on 5/22.   ?

## 2021-11-25 NOTE — Telephone Encounter (Signed)
Remote ICM transmission received.  Attempted call to patient regarding ICM remote transmission and left detailed message per DPR.  Advised to return call for any fluid symptoms or questions. Next ICM remote transmission scheduled 12/02/2021.   ? ?

## 2021-11-25 NOTE — Progress Notes (Signed)
EPIC Encounter for ICM Monitoring ? ?Patient Name: [name] is a 71 y.o. male ?Date: 11/25/2021 ?Primary Care Physican: Seward Carol, MD ?Primary Cardiologist: Aundra Dubin ?Electrophysiologist: Allred ?Bi-V Pacing: >99%        ?07/09/2021 Weight: 285 lbs ?09/06/2021 Weight: 273 lbs ?10/14/2021 Weight: 272 lbs ?11/17/2021 Weight: 277 lbs ?11/19/2021 Weight: 272 lbs  ?11/25/2021 Weight: 272 lbs ?                                                         ?AT/AF Burden:  increased from <1% (taking Xarelto) to 13% ?  ?Attempted call to patient and unable to reach.  Left detailed message per DPR regarding transmission. Transmission reviewed.   ?  ?CorVue thoracic impedance suggesting possible fluid accumulation returned starting 5/14.   AT/AF showing possibly starting on 4/27 and burden increased to 13%. ?  ?Prescribed: ?Torsemide 20 mg take 1 tablet (20 mg total) daily.   ?Klor Con 20 mEq take take 1 tablet daily  ?Spironolactone 25 mg take 1 tablet daily ?  ?Labs: ?07/09/2021 Creatinine 1.27, BUN 29, Potassium 4.5, Sodium 141, GFR 62 ?02/05/2021 Creatinine 1.50 ?11/30/2020 Creatinine 1.16, BUN 22, Potassium 4.8, Sodium 136, GFR >60 ?A complete set of results can be found in Results Review. ?  ?Recommendations:  Left voice mail with ICM number and encouraged to call if experiencing any fluid symptoms. ?  ?Follow-up plan: ICM clinic phone appointment on 12/02/2021 to recheck fluid levels.   91 day device clinic remote transmission 12/02/2021.   ?  ?EP/Cardiology Office Visits:  Recall 07/04/2022 with Oda Kilts, [place].   01/17/2022 with Dr. Aundra Dubin. ?  ?Copy of ICM check sent to Dr. Rayann Heman.  Will send copy to [name] for review and recommendations if patient is reached.   ? ?3 month ICM trend: 11/25/2021. ? ? ?  ? ?12-14 Month ICM trend:  ? ? ? ?Rosalene Billings, RN ?11/25/2021 ?3:03 PM ? ?

## 2021-11-26 DIAGNOSIS — G4733 Obstructive sleep apnea (adult) (pediatric): Secondary | ICD-10-CM | POA: Diagnosis not present

## 2021-11-26 NOTE — Progress Notes (Signed)
Increase torsemide to 40 mg daily for 3 days then back to 20 mg daily.  It looks like he has gone into atrial fibrillation. Needs appointment in our office or afib clinic in the next week.  ?

## 2021-11-27 ENCOUNTER — Telehealth: Payer: Self-pay

## 2021-11-27 NOTE — Telephone Encounter (Signed)
Spoke with patient and advised [name] at [facility] clinic was trying to reach him regarding afib clinic appointment tomorrow at 11:30 instead of Monday appointment.  He said agreed to afib clinic appointment tomorrow, 5/18 at 11:30.  Message sent to [facility] and she will confirm with Afib clinic that patient agreed to the appointment tomorrow.  Pt verbalized understanding of date, time and location of clinic.   ?

## 2021-11-27 NOTE — Progress Notes (Signed)
Spoke with patient and advised [name] recommended to increase Torsemide to 40 mg daily for 3 days then back to 20 mg daily.  Also explained he looks like he is out of SR and [name] has suggested for him to have an appointment either with HF clinic or Afib clinic.  Pt has never been seen in Afib clinic.  Advised will check with HF clinic which is preferred for him to be seen and either myself or HF clinic will call him regarding appointment.    ?

## 2021-11-27 NOTE — Progress Notes (Signed)
Appt sch for 5/22 at 10:30 with Afib clinic, pt is aware and agreeable ?

## 2021-11-28 ENCOUNTER — Ambulatory Visit (HOSPITAL_COMMUNITY)
Admission: RE | Admit: 2021-11-28 | Discharge: 2021-11-28 | Disposition: A | Discharge status: Home / Self Care | Payer: Medicare HMO | Source: Ambulatory Visit | Attending: Physician Assistant | Admitting: Physician Assistant

## 2021-11-28 ENCOUNTER — Encounter (HOSPITAL_COMMUNITY): Payer: Self-pay | Admitting: Nurse Practitioner

## 2021-11-28 VITALS — BP 132/74 | HR 63 | Ht 70.0 in | Wt 267.6 lb

## 2021-11-28 DIAGNOSIS — I4819 Other persistent atrial fibrillation: Secondary | ICD-10-CM

## 2021-11-28 DIAGNOSIS — Z7901 Long term (current) use of anticoagulants: Secondary | ICD-10-CM | POA: Insufficient documentation

## 2021-11-28 DIAGNOSIS — I484 Atypical atrial flutter: Secondary | ICD-10-CM

## 2021-11-28 DIAGNOSIS — I5022 Chronic systolic (congestive) heart failure: Secondary | ICD-10-CM | POA: Insufficient documentation

## 2021-11-28 DIAGNOSIS — I110 Hypertensive heart disease with heart failure: Secondary | ICD-10-CM | POA: Insufficient documentation

## 2021-11-28 DIAGNOSIS — D6869 Other thrombophilia: Secondary | ICD-10-CM | POA: Diagnosis not present

## 2021-11-28 NOTE — Progress Notes (Signed)
Primary Care Physician: Seward Carol, MD Referring Physician:   [name] is a 71 y.o. male with a h/o 71 y.o. with history of chronic atrial fibrillation and flutter with failed Maze procedure, valvular heart disease s/p mitral and aortic valve repairs, and chronic systolic CHF.   Patient has a long history of atrial fibrillation.  He was initially controlled by Tikosyn but it eventually became chronic.  He had significant mitral and aortic regurgitation, and in 11/19, he had mitral and aortic valve repairs with Maze and LA appendage ligation. Cath prior to valve surgery in 10/19 showed no significant coronary disease.  It appears from ECGs that he never went back into NSR.  He had had slow atrial fibrillation with rate in upper 30s-40s at times, so was never started on amiodarone.  Last echo in 4/20 showed EF 40-45% with stable aortic and mitral valve repairs.  He had developed worsening volume overload at that time, and Lasix titrated up to 80 mg bid.  He went into atypical atrial flutter with HR consistently in the 110s range.  He therefore was taken for DCCV 03/10/19.  Initially rhythm looked junctional in the 40s-50s, but he subsequently went back into rapid atypical atrial flutter with elevated rate (>100 bpm). Echo was done in 9/20, showing EF worse at 20-25%.      He saw Dr. Rayann Heman and had atrial flutter + atrial fibrillation ablation in 04/2020.  Post-procedure, he was noted to be in slow atrial fibrillation (rate 40s) at time of his atrial fibrillation clinic followup.    Patient had [employer] Jude CRT-D implantation in 2/21.  In 6/21, he had AV nodal ablation to allow CRT given recurrent atrial flutter/fibrillation. Echo in 10/21 showed EF up to 60-65%.   He was seen by Dr. Rayann Heman 07/09/21 and he made note that he had been in [place] since 05/14/21.   F/u in afib clinic, 11/28/21. He is being seen today as he has had worsening fluid retention recently with  increase in diuretics by HF clinic.  Interrogation of his device 5/15 appears he has been out of rhythm since 27 th of April. Ekg today shows v pacing with CVR. I discussed with [name]. Jude rep, and he looked at last paceart report and said that the pt is in afib not atrial flutter so he will not be able to overdrive pt back into rhythm. Pt is hesitant to have a cardioversion, states they have not worked in the past as well as he feels well. I discussed with Dr. Aundra Dubin and he would prefer he have a cardioversion as he is concerned with worsening heart function left out of rhythm.   Pt would prefer to discuss with Dr. Aundra Dubin in person  before committing to procedure. Appointment to work pt in is being addressed.   Today, he denies symptoms of palpitations, chest pain, shortness of breath, orthopnea, PND, lower extremity edema, dizziness, presyncope, syncope, or neurologic sequela. The patient is tolerating medications without difficulties and is otherwise without complaint today.   Past Medical History:  Diagnosis Date   AICD (automatic cardioverter/defibrillator) present    Aortic insufficiency 04/21/2018   Aortic insufficiency    Arthritis    "joints" (09/26/2013)   Atrial enlargement, left    CHF (congestive heart failure) ([place])    Essential hypertension 11/28/2020   Hearing aid worn    both ears   Mitral regurgitation 04/21/2018   Obesity    OSA on CPAP    "mask  adjusts to what I need" (09/26/2013)   Persistent atrial fibrillation ([place])    cardioversion 06/06/13   PONV (postoperative nausea and vomiting)    Rotator cuff tear, right dec 2012   physical therapy done, decreased strength   S/P aortic valve repair 05/18/2018   Complex valvuloplasty including plication of right coronary leaflet and 21 mm Biostable HAART annuloplasty ring   S/P Maze operation for atrial fibrillation 05/18/2018   Complete bilateral atrial lesion set using bipolar radiofrequency and cryothermy ablation with clipping of LA appendage   S/P mitral  valve repair 05/18/2018   Complex valvuloplasty including artificial Gore-tex neochord placement x4 and 30 mm Sorin Memo 4D ring annuloplasty   Wears glasses    Past Surgical History:  Procedure Laterality Date   AORTIC VALVE REPAIR N/A 05/18/2018   Procedure: AORTIC VALVE REPAIR using HAART 300 Aortic Annuloplasty Device size 36m;  Surgeon: ORexene Alberts MD;  [place] [facility]  Service: Open Heart Surgery;  Laterality: N/A;   ATRIAL FIBRILLATION ABLATION N/A 04/26/2019   Procedure: ATRIAL FIBRILLATION ABLATION;  Surgeon: AThompson Grayer MD;  [facility];  Service: Cardiovascular;  Laterality: N/A;   AV NODE ABLATION N/A 12/20/2019   Procedure: AV NODE ABLATION;  Surgeon: AThompson Grayer MD;  [facility];  Service: Cardiovascular;  Laterality: N/A;   BIV ICD INSERTION CRT-D N/A 08/23/2019   Procedure: BIV ICD INSERTION CRT-D;  Surgeon: AThompson Grayer MD;  [facility];  Service: Cardiovascular;  Laterality: N/A;   CARDIOVERSION N/A 06/06/2013   Procedure: CARDIOVERSION;  Surgeon: MSanda Klein MD;  [facility];  Service: Cardiovascular;  Laterality: N/A;   CARDIOVERSION N/A 09/28/2013   Procedure: CARDIOVERSION BEDSIDE;  Surgeon: Peter M JMartinique MD;  [place] [facility]  Service: Cardiovascular;  Laterality: N/A;   CARDIOVERSION N/A 11/30/2018   Procedure: CARDIOVERSION;  Surgeon: NDorothy Spark MD;  [facility];  Service: Cardiovascular;  Laterality: N/A;   CARDIOVERSION N/A 03/10/2019   Procedure: CARDIOVERSION;  Surgeon: SDonato Heinz MD;  [place] [facility] [facility]  Service: Endoscopy;  Laterality: N/A;   CARPAL TUNNEL RELEASE Right 08/08/2013   Procedure: RIGHT CARPAL TUNNEL RELEASE;  Surgeon: GWynonia Sours MD;  [place] [facility]  Service: Orthopedics;  Laterality: Right;   CARPAL TUNNEL RELEASE Left 2008   CLIPPING OF ATRIAL APPENDAGE  05/18/2018   Procedure: CLIPPING OF ATRIAL APPENDAGE  using AtriCure Clip size 45;  Surgeon: ORexene Alberts MD;  [place] [facility]  Service: Open Heart Surgery;;   KNEE ARTHROSCOPY Right 1990's   MAZE N/A 05/18/2018   Procedure: MAZE;  Surgeon: ORexene Alberts MD;  [place] [facility] [facility]  Service: Open Heart Surgery;  Laterality: N/A;   MITRAL VALVE REPAIR N/A 05/18/2018   Procedure: MITRAL VALVE REPAIR (MVR) using 4D Memo Ring size 30;  Surgeon: ORexene Alberts MD;  [place] [facility]  Service: Open Heart Surgery;  Laterality: N/A;   RIGHT/LEFT HEART CATH AND CORONARY ANGIOGRAPHY N/A 04/21/2018   Procedure: RIGHT/LEFT HEART CATH AND CORONARY ANGIOGRAPHY;  Surgeon: ENelva Bush MD;  [facility];  Service: Cardiovascular;  Laterality: N/A;   SHOULDER OPEN ROTATOR CUFF REPAIR Left 1990's   SHOULDER OPEN ROTATOR CUFF REPAIR Right 11/2007   TEE WITHOUT CARDIOVERSION N/A 04/21/2018   Procedure: TRANSESOPHAGEAL ECHOCARDIOGRAM (TEE);  Surgeon: SJerline Pain MD;  [facility];  Service: Cardiovascular;  Laterality: N/A;   TEE WITHOUT CARDIOVERSION N/A 05/18/2018  Procedure: TRANSESOPHAGEAL ECHOCARDIOGRAM (TEE);  Surgeon: Rexene Alberts, MD;  Location: [place];  Service: Open Heart Surgery;  Laterality: N/A;   TONSILLECTOMY  1950's   TOTAL HIP ARTHROPLASTY Left 2010   TOTAL HIP ARTHROPLASTY Right 05/29/2020   Procedure: TOTAL HIP ARTHROPLASTY ANTERIOR APPROACH;  Surgeon: Renette Butters, MD;  [facility];  Service: Orthopedics;  Laterality: Right;   TOTAL HIP REVISION Left 10/08/2011   TOTAL HIP REVISION  04/09/2012   Procedure: TOTAL HIP REVISION;  Surgeon: Gearlean Alf, MD;  [facility];  Service: Orthopedics;  Laterality: Left;  Left Hip Acetabular Revision vs Constrained Liner   TOTAL KNEE ARTHROPLASTY Right 2006    TRIGGER FINGER RELEASE Right 08/08/2013   Procedure: RELEASE STENOSING TENOSYNOVITIS RIGHT THUMB;  Surgeon: Wynonia Sours, MD;  Location: [place];  Service: Orthopedics;  Laterality:  Right;    Current Outpatient Medications  Medication Sig Dispense Refill   acetaminophen (TYLENOL) 325 MG tablet Take 2 tablets (650 mg total) by mouth every 4 (four) hours as needed for headache or mild pain.     Ascorbic Acid (VITAMIN C) 1000 MG tablet Take 1,000 mg by mouth daily.     carvedilol (COREG) 6.25 MG tablet Take 1 tablet (6.25 mg total) by mouth 2 (two) times daily with a meal. 180 tablet 3   clobetasol ointment (TEMOVATE) 3.54 % Apply 1 application topically daily as needed (rash).     diclofenac Sodium (VOLTAREN) 1 % GEL Apply 2 g topically daily as needed (pain).      ENTRESTO 24-26 MG TAKE 1 TABLET BY MOUTH TWICE A DAY 90 tablet 3   HYDROcodone-acetaminophen (NORCO/VICODIN) 5-325 MG tablet Take 1 tablet by mouth every 8 (eight) hours as needed.     ivermectin (STROMECTOL) 3 MG TABS tablet Take by mouth as needed.     MAGNESIUM PO Take 1 tablet by mouth daily.     Multiple Vitamin (MULTIVITAMIN WITH MINERALS) TABS tablet Take 1 tablet by mouth daily.      Naproxen Sod-diphenhydrAMINE 220-25 MG TABS Take 2 tablets by mouth at bedtime as needed (sleep/pain).     potassium chloride SA (KLOR-CON M) 20 MEQ tablet Take 1 tablet (20 mEq total) by mouth daily. 90 tablet 3   Probiotic Product (PROBIOTIC PO) Take 1 capsule by mouth daily.     rivaroxaban (XARELTO) 20 MG TABS tablet TAKE ONE TABLET BY MOUTH DAILY WITH SUPPER 30 tablet 10   spironolactone (ALDACTONE) 25 MG tablet TAKE 1 TABLET BY MOUTH EVERY DAY 90 tablet 3   tadalafil (CIALIS) 10 MG tablet Take 10 mg by mouth daily as needed.     torsemide (DEMADEX) 20 MG tablet Take 1 tablet (20 mg total) by mouth daily. 90 tablet 3   zinc gluconate 50 MG tablet Take 50 mg by mouth daily.     No current facility-administered medications for this encounter.    Allergies  Allergen Reactions   Metoprolol Tartrate Shortness Of Breath    Shortness of breath, pressure in chest and back   Oxycodone Rash and Other (See Comments)    Can  tolerate Hydrocodone    Social History   Socioeconomic History   Marital status: Married    Spouse name: Not on file   Number of children: Not on file   Years of education: Not on file   Highest education level: Not on file  Occupational History   Occupation: Agricultural consultant  Tobacco Use  Smoking status: Never   Smokeless tobacco: Never  Vaping Use   Vaping Use: Never used  Substance and Sexual Activity   Alcohol use: No   Drug use: No   Sexual activity: Yes  Other Topics Concern   Not on file  Social History Narrative   Pt lives in [place] with spouse.  Leadership training and behavioral safety training.   Social Determinants of Health   Financial Resource Strain: Not on file  Food Insecurity: Not on file  Transportation Needs: Not on file  Physical Activity: Not on file  Stress: Not on file  Social Connections: Not on file  Intimate Partner Violence: Not on file    Family History  Problem Relation Age of Onset   Other Other        mother had a pacemaker   Hypertension Father     ROS- All systems are reviewed and negative except as per the HPI above  Physical Exam: There were no vitals filed for this visit. Wt Readings from Last 3 Encounters:  07/09/21 129.3 kg  11/30/20 126.6 kg  08/06/20 118.8 kg    Labs: Lab Results  Component Value Date   NA 141 07/09/2021   K 4.5 07/09/2021   CL 101 07/09/2021   CO2 27 07/09/2021   GLUCOSE 76 07/09/2021   BUN 29 (H) 07/09/2021   CREATININE 1.27 07/09/2021   CALCIUM 10.0 07/09/2021   MG 2.2 05/20/2018   Lab Results  Component Value Date   INR 1.7 (A) 09/02/2018   Lab Results  Component Value Date   CHOL 160 11/30/2020   HDL 37 (L) 11/30/2020   LDLCALC 94 11/30/2020   TRIG 147 11/30/2020     GEN- The patient is well appearing, alert and oriented x 3 today.   Head- normocephalic, atraumatic Eyes-  Sclera clear, conjunctiva pink Ears- hearing intact Oropharynx-  clear Neck- supple, no JVP Lymph- no cervical lymphadenopathy Lungs- Clear to ausculation bilaterally, normal work of breathing Heart- Regular rate and rhythm, no murmurs, rubs or gallops, PMI not laterally displaced GI- soft, NT, ND, + BS Extremities- no clubbing, cyanosis, or edema MS- no significant deformity or atrophy Skin- no rash or lesion Psych- euthymic mood, full affect Neuro- strength and sensation are intact  EKG-Vent. rate 63 BPM PR interval * ms QRS duration 162 ms QT/QTcB 480/491 ms P-R-T axes * 257 60 Ventricular-paced rhythm with occasional Premature ventricular complexes Biventricular pacemaker detected Abnormal ECG When compared with ECG of 30-Nov-2020 12:02, PREVIOUS ECG IS PRESENT    Assessment and Plan:    1. Persistent afib S/p AV nodal ablation/PPM S/p atrial flutter and afib ablation 04/2020 S/p LAA clip and MAZE He has been in sinus since 05/14/21.  In afib ( confirmed not atrial flutter with device rep to r/o possibility of overdrive pacing)  since 4/27 with impedence of device suggesting fluid retention starting 5/14 and afib starting 4/27 with burden increased to 13% Diuretics adjusted by HF clinic  Pt is not symptomatic and is hesitant to have a cardioversion until he can speak to Dr. Aundra Dubin in person, states did not work in the past  I discussed with Dr. Aundra Dubin and he will try to work him in soon,  he would prefer him to return to [place] as he is concerned his heart function will decline if left out of rhythm  He is on xarelto for a [id number]  score of at least 3, states no missed doses for at least the last 3  weeks   Dr. Claris Gladden office will contact re making an appointment in the next week  One was given today for 5/23 but he will be moving and cannot make that appointment   [name], [facility] [address] [place], [place] [address] [phone]

## 2021-12-02 ENCOUNTER — Ambulatory Visit (INDEPENDENT_AMBULATORY_CARE_PROVIDER_SITE_OTHER): Payer: Medicare HMO

## 2021-12-02 ENCOUNTER — Ambulatory Visit (HOSPITAL_COMMUNITY): Payer: Medicare HMO | Admitting: Physician Assistant

## 2021-12-02 DIAGNOSIS — I442 Atrioventricular block, complete: Secondary | ICD-10-CM

## 2021-12-02 DIAGNOSIS — Z95810 Presence of automatic (implantable) cardiac defibrillator: Secondary | ICD-10-CM

## 2021-12-02 DIAGNOSIS — I5022 Chronic systolic (congestive) heart failure: Secondary | ICD-10-CM

## 2021-12-02 NOTE — Progress Notes (Signed)
EPIC Encounter for ICM Monitoring  Patient Name: [name] is a 71 y.o. male Date: 12/02/2021 Primary Care Physican: Seward Carol, MD Primary Cardiologist: Aundra Dubin Electrophysiologist: Allred Bi-V Pacing: >99%        07/09/2021 Weight: 285 lbs 09/06/2021 Weight: 273 lbs 10/14/2021  Weight: 272 lbs 11/17/2021  Weight: 277 lbs 11/19/2021  Weight: 272 lbs  11/25/2021 Weight: 272 lbs 12/02/2021 Office Weight: 267 lbs                                                          AT/AF Burden:  increased from <1% (taking Xarelto) to 17%   Spoke with patient and heart failure questions reviewed.  Pt took 40 mg Torsemide x 4 days instead of 3 to make sure the fluid returned to normal.   Discussed fluid intake.  Pt is in the process of moving into his new home.  He was without CPAP x 3 weeks and understanding this can play a role in in rhythm.  He has movers coming tomorrow and has appointment with [name] to discuss options regarding his rhythm.     CorVue thoracic impedance suggesting possible dryness after taking Torsemide 40 mg x 3 days (5/18-5/20).   AT/AF showing possibly starting on 4/27 and burden increased to 13%.   Prescribed: Torsemide 20 mg take 1 tablet (20 mg total) daily.   Klor Con 20 mEq take take 1 tablet daily  Spironolactone 25 mg take 1 tablet daily   Labs: 07/09/2021 Creatinine 1.27, BUN 29, Potassium 4.5, Sodium 141, GFR 62 02/05/2021 Creatinine 1.50 11/30/2020 Creatinine 1.16, BUN 22, Potassium 4.8, Sodium 136, GFR >60 A complete set of results can be found in Results Review.   Recommendations:  Advised to drink 64 oz fluid to stay hydrated.  He will call for changes in condition.    Follow-up plan: ICM clinic phone appointment on 12/10/2021 to recheck fluid levels.   91 day device clinic remote transmission 03/03/2022.     EP/Cardiology Office Visits:  Recall 07/04/2022 with Oda Kilts, [place].   01/17/2022 with Dr. Aundra Dubin.   Copy of ICM check sent to Dr. Rayann Heman.  3 month  ICM trend: 12/02/2021.    12-14 Month ICM trend:     Rosalene Billings, RN 12/02/2021 4:03 PM

## 2021-12-03 ENCOUNTER — Encounter (HOSPITAL_COMMUNITY): Payer: Medicare HMO | Admitting: Cardiology

## 2021-12-03 LAB — CUP PACEART REMOTE DEVICE CHECK
Battery Remaining Longevity: 57 mo
Battery Remaining Percentage: 65 %
Battery Voltage: 2.96 V
Brady Statistic AP VP Percent: 98 %
Brady Statistic AP VS Percent: 1 %
Brady Statistic AS VP Percent: 1 %
Brady Statistic AS VS Percent: 1 %
Brady Statistic RA Percent Paced: 81 %
Date Time Interrogation Session: 20230522020902
HighPow Impedance: 91 Ohm
Implantable Lead Implant Date: 20210209
Implantable Lead Implant Date: 20210209
Implantable Lead Implant Date: 20210209
Implantable Lead Location: 753858
Implantable Lead Location: 753859
Implantable Lead Location: 753860
Implantable Pulse Generator Implant Date: 20210209
Lead Channel Impedance Value: 400 Ohm
Lead Channel Impedance Value: 440 Ohm
Lead Channel Impedance Value: 800 Ohm
Lead Channel Pacing Threshold Amplitude: 0.75 V
Lead Channel Pacing Threshold Amplitude: 0.75 V
Lead Channel Pacing Threshold Amplitude: 1.25 V
Lead Channel Pacing Threshold Pulse Width: 0.5 ms
Lead Channel Pacing Threshold Pulse Width: 0.5 ms
Lead Channel Pacing Threshold Pulse Width: 0.5 ms
Lead Channel Sensing Intrinsic Amplitude: 12 mV
Lead Channel Sensing Intrinsic Amplitude: 3.7 mV
Lead Channel Setting Pacing Amplitude: 2 V
Lead Channel Setting Pacing Amplitude: 2.5 V
Lead Channel Setting Pacing Amplitude: 2.5 V
Lead Channel Setting Pacing Pulse Width: 0.5 ms
Lead Channel Setting Pacing Pulse Width: 0.5 ms
Lead Channel Setting Sensing Sensitivity: 0.5 mV
Pulse Gen Serial Number: 111006442

## 2021-12-10 ENCOUNTER — Ambulatory Visit (HOSPITAL_COMMUNITY)
Admission: RE | Admit: 2021-12-10 | Discharge: 2021-12-10 | Disposition: A | Discharge status: Home / Self Care | Payer: Medicare HMO | Source: Ambulatory Visit | Attending: Cardiology | Admitting: Cardiology

## 2021-12-10 ENCOUNTER — Encounter (HOSPITAL_COMMUNITY): Payer: Self-pay | Admitting: Cardiology

## 2021-12-10 ENCOUNTER — Other Ambulatory Visit (HOSPITAL_COMMUNITY): Payer: Self-pay

## 2021-12-10 ENCOUNTER — Ambulatory Visit (INDEPENDENT_AMBULATORY_CARE_PROVIDER_SITE_OTHER): Payer: Medicare HMO

## 2021-12-10 VITALS — BP 140/80 | HR 62 | Wt 276.0 lb

## 2021-12-10 DIAGNOSIS — Z7901 Long term (current) use of anticoagulants: Secondary | ICD-10-CM | POA: Diagnosis not present

## 2021-12-10 DIAGNOSIS — I4819 Other persistent atrial fibrillation: Secondary | ICD-10-CM

## 2021-12-10 DIAGNOSIS — I110 Hypertensive heart disease with heart failure: Secondary | ICD-10-CM | POA: Diagnosis not present

## 2021-12-10 DIAGNOSIS — I5022 Chronic systolic (congestive) heart failure: Secondary | ICD-10-CM | POA: Diagnosis not present

## 2021-12-10 DIAGNOSIS — I428 Other cardiomyopathies: Secondary | ICD-10-CM | POA: Insufficient documentation

## 2021-12-10 DIAGNOSIS — I7121 Aneurysm of the ascending aorta, without rupture: Secondary | ICD-10-CM | POA: Insufficient documentation

## 2021-12-10 DIAGNOSIS — G4733 Obstructive sleep apnea (adult) (pediatric): Secondary | ICD-10-CM | POA: Insufficient documentation

## 2021-12-10 DIAGNOSIS — I059 Rheumatic mitral valve disease, unspecified: Secondary | ICD-10-CM | POA: Diagnosis not present

## 2021-12-10 DIAGNOSIS — Z79899 Other long term (current) drug therapy: Secondary | ICD-10-CM | POA: Diagnosis not present

## 2021-12-10 DIAGNOSIS — R000 Tachycardia, unspecified: Secondary | ICD-10-CM | POA: Insufficient documentation

## 2021-12-10 DIAGNOSIS — I4892 Unspecified atrial flutter: Secondary | ICD-10-CM | POA: Diagnosis not present

## 2021-12-10 DIAGNOSIS — Z95810 Presence of automatic (implantable) cardiac defibrillator: Secondary | ICD-10-CM

## 2021-12-10 DIAGNOSIS — I484 Atypical atrial flutter: Secondary | ICD-10-CM

## 2021-12-10 LAB — BASIC METABOLIC PANEL
Anion gap: 7 (ref 5–15)
BUN: 29 mg/dL — ABNORMAL HIGH (ref 8–23)
CO2: 27 mmol/L (ref 22–32)
Calcium: 9.8 mg/dL (ref 8.9–10.3)
Chloride: 105 mmol/L (ref 98–111)
Creatinine, Ser: 1.33 mg/dL — ABNORMAL HIGH (ref 0.61–1.24)
GFR, Estimated: 58 mL/min — ABNORMAL LOW (ref >60)
Glucose, Bld: 113 mg/dL — ABNORMAL HIGH (ref 70–99)
Potassium: 4.3 mmol/L (ref 3.5–5.1)
Sodium: 139 mmol/L (ref 135–145)

## 2021-12-10 LAB — CBC
HCT: 45.6 % (ref 39.0–52.0)
Hemoglobin: 14.9 g/dL (ref 13.0–17.0)
MCH: 29 pg (ref 26.0–34.0)
MCHC: 32.7 g/dL (ref 30.0–36.0)
MCV: 88.7 fL (ref 80.0–100.0)
Platelets: 123 10*3/uL — ABNORMAL LOW (ref 150–400)
RBC: 5.14 MIL/uL (ref 4.22–5.81)
RDW: 14 % (ref 11.5–15.5)
WBC: 8.3 10*3/uL (ref 4.0–10.5)
nRBC: 0 % (ref 0.0–0.2)

## 2021-12-10 NOTE — Progress Notes (Signed)
PCP: Seward Carol, MD EP: Dr. Rayann Heman Cardiology: Dr. Gwenlyn Found HF Cardiology: Dr. Aundra Dubin  71 y.o. with history of chronic atrial fibrillation and flutter with failed Maze procedure, valvular heart disease s/p mitral and aortic valve repairs, and chronic systolic CHF was referred by Dr. Rayann Heman for CHF evaluation.  Patient has a long history of atrial fibrillation.  He was initially controlled by Tikosyn but it eventually became chronic.  He had significant mitral and aortic regurgitation, and in 11/19, he had mitral and aortic valve repairs with Maze and LA appendage ligation. Cath prior to valve surgery in 10/19 showed no significant coronary disease.  It appears from ECGs that he never went back into NSR.  He has had slow atrial fibrillation with rate in upper 30s-40s at times, so was never started on amiodarone.  Last echo in 4/20 showed EF 40-45% with stable aortic and mitral valve repairs.  He has developed worsening volume overload, and Lasix has been titrated up to 80 mg bid.  Most recently, he went into atypical atrial flutter with HR consistently in the 110s range.  He therefore was taken for DCCV 03/10/19.  Initially rhythm looked junctional in the 40s-50s, but he subsequently went back into rapid atypical atrial flutter with elevated rate (>100 bpm).   Echo was done in 9/20, showing EF worse at 20-25%.     He saw Dr. Rayann Heman and had atrial flutter + atrial fibrillation ablation in 10/20.  Post-procedure, he was noted to be in slow atrial fibrillation (rate 40s) at time of his atrial fibrillation clinic followup.   Patient had St Jude CRT-D implantation in 2/21.  In 6/21, he had AV nodal ablation to allow CRT given recurrent atrial flutter/fibrillation. Echo in 10/21 showed EF up to 60-65%.    He was seen in atrial fibrillation clinic in 5/23 and was noted to be in atrial fibrillation since 4/23.  Thoracic impedance suggested fluid retention.   He returns today for followup of CHF.  He is still  in atrial fibrillation.  He had been off CPAP since 4/23 (broke), now has restarted it.  Weight is stable. He denies exertional dyspnea or chest pain.  Recently moved from his prior house to an apartment, moved a lot of boxes without problems.  He supposes that he may be more tired recently.  No chest pain.  No orthopnea/PND.  No lightheadedness.    ECG (personally reviewed): Atrial fibrillation with BiV pacing  St Jude device interrogation: >99% BiV pacing, thoracic impedance has trended down recently.    Labs (8/20): K 3.9, creatinine 1.14, BNP 473, hgb 13.3 Labs (9/20): K 3.3 => 3.7, creatinine 1.35 => 1.3 Labs (12/20): K 4.1, creatinine 1.2 Labs (1/21): K 4.1, creatinine 1.39 Labs (8/21): K 4.3, creatinine 1.42, plts 136 Labs (12/21): K 3.3, creatinine 1.27 Labs (12/22): K 4.5, creatinine 1.27, hgb 14.3, pro-BNP 284  PMH: 1. HTN 2. Paroxysmal atrial fibrillation/flutter: Failed Tikosyn.  Had Maze procedure 11/19 but does not appear to have ever gone back into NSR.  Had been in atrial fibrillation with bradycardia with rate in 30s-40s at times, now in atypical atrial flutter primarily with rate 100s-110s.  He failed DCCV 8/20.  No amiodarone due to h/o bradycardia.  - Atrial fibrillation + atrial flutter ablation in 10/20.  3. OSA: Uses CPAP.  4. Chronic systolic CHF: Nonischemic cardiomyopathy.  - 10/19 LHC: No coronary disease.  - Echo (4/20): EF 40-45%, mild LVH, moderate dilated RV with mildly decreased systolic function, s/p MV  repair with no significant stenosis or regurgitation, s/p aortic valve repair with mild AI, mild aortic stenosis.  Dilated IVC.  - Echo (9/20): EF 20-25%, mild LVH with moderate LV dilation, s/p MV repair with mean gradient 6, s/p AoV repair with mean gradient 8. No significant MR or AI.  - St Jude CRT-D implantation in 2/21.  - AV nodal ablation 6/21.  - Echo (10/21): EF 60-65%, mild LV enlargement and LVH, moderate RV enlargement with normal function, severe  LVE, s/p MV repair with no MR and mean gradient 3, s/p aortic valve repair with mean gradient 9 and no AI, ascending aorta 4.6 cm.  5. Valvular heart disease: Patient has history of MR and AI.  In 11/19, he had mitral valve repair, aortic valve repair, surgical Maze, and LA appendage excision.  6. LBBB 7. Ascending aorta aneurysm: 4.6 cm on 10/21 echo.  - CTA chest (7/22): 4.1 cm ascending aorta 8. Bilateral THRs  Social History   Socioeconomic History   Marital status: Married    Spouse name: Not on file   Number of children: Not on file   Years of education: Not on file   Highest education level: Not on file  Occupational History   Occupation: Agricultural consultant  Tobacco Use   Smoking status: Never   Smokeless tobacco: Never  Vaping Use   Vaping Use: Never used  Substance and Sexual Activity   Alcohol use: No   Drug use: No   Sexual activity: Yes  Other Topics Concern   Not on file  Social History Narrative   Pt lives in [place] with spouse.  Leadership training and behavioral safety training.   Social Determinants of Health   Financial Resource Strain: Not on file  Food Insecurity: Not on file  Transportation Needs: Not on file  Physical Activity: Not on file  Stress: Not on file  Social Connections: Not on file  Intimate Partner Violence: Not on file   Family History  Problem Relation Age of Onset   Other Other        mother had a pacemaker   Hypertension Father    ROS: All systems reviewed and negative except as per HPI.   Current Outpatient Medications  Medication Sig Dispense Refill   acetaminophen (TYLENOL) 325 MG tablet Take 2 tablets (650 mg total) by mouth every 4 (four) hours as needed for headache or mild pain.     Ascorbic Acid (VITAMIN C) 1000 MG tablet Take 1,000 mg by mouth daily.     carvedilol (COREG) 6.25 MG tablet Take 1 tablet (6.25 mg total) by mouth 2 (two) times daily with a meal. 180 tablet 3   clobetasol ointment  (TEMOVATE) 6.64 % Apply 1 application topically daily as needed (rash).     diclofenac Sodium (VOLTAREN) 1 % GEL Apply 2 g topically daily as needed (pain).      ENTRESTO 24-26 MG TAKE 1 TABLET BY MOUTH TWICE A DAY 90 tablet 3   HYDROcodone-acetaminophen (NORCO/VICODIN) 5-325 MG tablet Take 1 tablet by mouth every 8 (eight) hours as needed.     ivermectin (STROMECTOL) 3 MG TABS tablet Take by mouth as needed.     MAGNESIUM PO Take 1 tablet by mouth 3 (three) times a week.     Multiple Vitamin (MULTIVITAMIN WITH MINERALS) TABS tablet Take 1 tablet by mouth daily.      potassium chloride SA (KLOR-CON M) 20 MEQ tablet Take 1 tablet (20 mEq total) by mouth  daily. 90 tablet 3   Probiotic Product (PROBIOTIC PO) Take 1 capsule by mouth daily.     rivaroxaban (XARELTO) 20 MG TABS tablet TAKE ONE TABLET BY MOUTH DAILY WITH SUPPER 30 tablet 10   spironolactone (ALDACTONE) 25 MG tablet TAKE 1 TABLET BY MOUTH EVERY DAY 90 tablet 3   tadalafil (CIALIS) 10 MG tablet Take 10 mg by mouth daily as needed.     torsemide (DEMADEX) 20 MG tablet Take 1 tablet (20 mg total) by mouth daily. 90 tablet 3   zinc gluconate 50 MG tablet Take 50 mg by mouth 3 (three) times a week.     No current facility-administered medications for this encounter.   BP 140/80   Pulse 62   Wt 125.2 kg (276 lb)   SpO2 95%   BMI 39.60 kg/m  General: NAD Neck: Thick.  No JVD, no thyromegaly or thyroid nodule.  Lungs: Clear to auscultation bilaterally with normal respiratory effort. CV: Nondisplaced PMI.  Heart regular S1/S2, no S3/S4, no murmur.  No peripheral edema.  No carotid bruit.  Normal pedal pulses.  Abdomen: Soft, nontender, no hepatosplenomegaly, no distention.  Skin: Intact without lesions or rashes.  Neurologic: Alert and oriented x 3.  Psych: Normal affect. Extremities: No clubbing or cyanosis.  HEENT: Normal.   Assessment/Plan: 1. Chronic systolic CHF: Echo in 1/61 with EF 40-45%, moderate RV dilation/mildly  decreased RV function.  Nonischemic cardiomyopathy, cath in 10/19 without significant coronary disease.  Repeat echo in 9/20 showed fall in EF to 20-25% in the setting of atrial fibrillation and atrial flutter with persistent tachycardia.  He is now s/p St Jude CRT-D and AV nodal ablation.  Echo in 10/21 with EF up to 60-65%.  NYHA class II symptoms.  Though he is not very symptomatic, Corvue suggests volume accumulation since he went back into atrial fibrillation.   - I think he would benefit from being back in NSR, see below.  - For now, continue torsemide 20 mg daily.  BMET today.   - Continue spironolactone 25 mg daily.   - Continue Entresto 24/26 bid.    - Continue Coreg 6.25 mg bid.  - I will arrange for repeat echo.  2. Atrial arrhythmias: Long history of chronic atrial fibrillation, has had slow ventricular response in the past (HR 40s at times). He failed Tikosyn and have avoided amiodarone with bradycardia.  He had Maze procedure in 11/19 but it was not successful.  At initial appointment with me, he was in atypical flutter with HR 110s.  He failed DCCV in 8/20.  He had flutter/fibrillation ablation in 10/20.  Initially after ablation, he was in slow atrial fibrillation (rate upper 30s-50s generally).  He is now s/p AV nodal ablation with CRT-D.  He had generally been in NSR, but since 4/23 has been back in atrial fibrillation.  Though he is minimally symptomatic, it looks like he is developing fluid overload based on Corvue.   - I would like to keep him in NSR.  I will arrange for DCCV. He has not missed any Xarelto doses.  We discussed risks/benefits and he agrees to procedure.   - He will continue Xarelto.  CBC today.  3. OSA: Continue CPAP.  4. Valvular heart disease: S/p MV and AoV repairs in 11/19.  Valves looked stable on last echo in 10/21.  - He will need antibiotic prophylaxis with dental work.  - Repeat echo as above.  5. Ascending aortic aneurysm: 4.6 cm on 10/21 echo.  CTA chest  in 7/22 showed 4.1 cm ascending aorta.   Followup in 6 wks after DCCV.   [name] 12/10/2021

## 2021-12-10 NOTE — Patient Instructions (Signed)
There has been no changes to your medications.  Labs done today, your results will be available in MyChart, we will contact you for abnormal readings.   Your physician has requested that you have an echocardiogram. Echocardiography is a painless test that uses sound waves to create images of your heart. It provides your doctor with information about the size and shape of your heart and how well your heart's chambers and valves are working. This procedure takes approximately one hour. There are no restrictions for this procedure.   You are scheduled for a TEE/Cardioversion/TEE Cardioversion on 60/03/2022 with Dr. [name].  Please arrive at the [facility] (Main Entrance A) at [facility]: [address] [place], [place] [address] at 11.30 am. (1 hour prior to procedure unless lab work is needed; if lab work is needed arrive 1.5 hours ahead)  DIET: Nothing to eat or drink after midnight except a sip of water with medications (see medication instructions below)  FYI: For your safety, and to allow [place] to monitor your vital signs accurately during the surgery/procedure we request that   if you have artificial nails, gel coating, SNS etc. Please have those removed prior to your surgery/procedure. Not having the nail coverings /polish removed may result in cancellation or delay of your surgery/procedure.   Medication Instructions: Hold spironolactone and Torsemide the morning of procedure  Continue your anticoagulant: Xarelto You will need to continue your anticoagulant after your procedure until you  are told by your  Provider that it is safe to stop   You must have a responsible person to drive you home and stay in the waiting area during your procedure. Failure to do so could result in cancellation.  Bring your insurance cards.  *Special Note: Every effort is made to have your procedure done on time. Occasionally there are emergencies that occur at the hospital that may cause  delays. Please be patient if a delay does occur.   Your physician recommends that you schedule a follow-up appointment for July 7th with an Echocardiogram at 2pm then folow up with Dr. Aundra Dubin at 2.40 pm   If you have any questions or concerns before your next appointment please send [place] a message through [facility] or call our office at [phone].    TO LEAVE A MESSAGE FOR THE NURSE SELECT OPTION 2, PLEASE LEAVE A MESSAGE INCLUDING: YOUR NAME DATE OF BIRTH CALL BACK NUMBER REASON FOR CALL**this is important as we prioritize the call backs  YOU WILL RECEIVE A CALL BACK THE SAME DAY AS LONG AS YOU CALL BEFORE 4:00 PM  At the [facility], you and your health needs are our priority. As part of our continuing mission to provide you with exceptional heart care, we have created designated Provider Care Teams. These Care Teams include your primary Cardiologist (physician) and Advanced Practice Providers (APPs- Physician Assistants and Nurse Practitioners) who all work together to provide you with the care you need, when you need it.   You may see any of the following providers on your designated Care Team at your next follow up: Dr Glori Bickers Dr Haynes Kerns, NP Lyda Jester, [place] [facility] [place], [place] Audry Riles, PharmD   Please be sure to bring in all your medications bottles to every appointment.

## 2021-12-10 NOTE — Progress Notes (Signed)
EPIC Encounter for ICM Monitoring  Patient Name: [name] is a 71 y.o. male Date: 12/10/2021 Primary Care Physican: Seward Carol, MD Primary Cardiologist: Aundra Dubin Electrophysiologist: Allred Bi-V Pacing: >99%        07/09/2021 Weight: 285 lbs 09/06/2021 Weight: 273 lbs 10/14/2021  Weight: 272 lbs 11/17/2021  Weight: 277 lbs 11/19/2021  Weight: 272 lbs  11/25/2021 Weight: 272 lbs 12/02/2021 Office Weight: 267 lbs 12/10/2021 Office Weight: 276 lbs                                                          AT/AF Burden: 22% (taking Xarelto)    Spoke with patient and heart failure questions reviewed.  He reports weight up 2-3 lbs and can tell he is holding fluid.  He took extra Torsemide today and had OV with [name] today.     CorVue thoracic impedance suggesting possible fluid accumulation starting 5/23.   Prescribed: Torsemide 20 mg take 1 tablet (20 mg total) daily.   Klor Con 20 mEq take take 1 tablet daily  Spironolactone 25 mg take 1 tablet daily   Labs: 12/10/2021 Creatinine 1.33, BUN 29, Potassium 4.3, Sodium 139, GFR 58 07/09/2021 Creatinine 1.27, BUN 29, Potassium 4.5, Sodium 141, GFR 62 A complete set of results can be found in Results Review.   Recommendations:  Pt self adjusted Torsemide today.   Recommendations given by [name] at 5/30 OV.  Cardioversion scheduled for 6/9.      Follow-up plan: ICM clinic phone appointment on 12/17/2021 to recheck fluid levels.   91 day device clinic remote transmission 03/03/2022.     EP/Cardiology Office Visits:  Recall 07/04/2022 with Oda Kilts, [place].   01/17/2022 with Dr. Aundra Dubin.   Copy of ICM check sent to Dr. Rayann Heman.   3 month ICM trend: 12/10/2021.    12-14 Month ICM trend:     Rosalene Billings, RN 12/10/2021 4:40 PM

## 2021-12-10 NOTE — H&P (View-Only) (Signed)
PCP: Seward Carol, MD EP: Dr. Rayann Heman Cardiology: Dr. Gwenlyn Found HF Cardiology: Dr. Aundra Dubin  71 y.o. with history of chronic atrial fibrillation and flutter with failed Maze procedure, valvular heart disease s/p mitral and aortic valve repairs, and chronic systolic CHF was referred by Dr. Rayann Heman for CHF evaluation.  Patient has a long history of atrial fibrillation.  He was initially controlled by Tikosyn but it eventually became chronic.  He had significant mitral and aortic regurgitation, and in 11/19, he had mitral and aortic valve repairs with Maze and LA appendage ligation. Cath prior to valve surgery in 10/19 showed no significant coronary disease.  It appears from ECGs that he never went back into NSR.  He has had slow atrial fibrillation with rate in upper 30s-40s at times, so was never started on amiodarone.  Last echo in 4/20 showed EF 40-45% with stable aortic and mitral valve repairs.  He has developed worsening volume overload, and Lasix has been titrated up to 80 mg bid.  Most recently, he went into atypical atrial flutter with HR consistently in the 110s range.  He therefore was taken for DCCV 03/10/19.  Initially rhythm looked junctional in the 40s-50s, but he subsequently went back into rapid atypical atrial flutter with elevated rate (>100 bpm).   Echo was done in 9/20, showing EF worse at 20-25%.     He saw Dr. Rayann Heman and had atrial flutter + atrial fibrillation ablation in 10/20.  Post-procedure, he was noted to be in slow atrial fibrillation (rate 40s) at time of his atrial fibrillation clinic followup.   Patient had St Jude CRT-D implantation in 2/21.  In 6/21, he had AV nodal ablation to allow CRT given recurrent atrial flutter/fibrillation. Echo in 10/21 showed EF up to 60-65%.    He was seen in atrial fibrillation clinic in 5/23 and was noted to be in atrial fibrillation since 4/23.  Thoracic impedance suggested fluid retention.   He returns today for followup of CHF.  He is still  in atrial fibrillation.  He had been off CPAP since 4/23 (broke), now has restarted it.  Weight is stable. He denies exertional dyspnea or chest pain.  Recently moved from his prior house to an apartment, moved a lot of boxes without problems.  He supposes that he may be more tired recently.  No chest pain.  No orthopnea/PND.  No lightheadedness.    ECG (personally reviewed): Atrial fibrillation with BiV pacing  St Jude device interrogation: >99% BiV pacing, thoracic impedance has trended down recently.    Labs (8/20): K 3.9, creatinine 1.14, BNP 473, hgb 13.3 Labs (9/20): K 3.3 => 3.7, creatinine 1.35 => 1.3 Labs (12/20): K 4.1, creatinine 1.2 Labs (1/21): K 4.1, creatinine 1.39 Labs (8/21): K 4.3, creatinine 1.42, plts 136 Labs (12/21): K 3.3, creatinine 1.27 Labs (12/22): K 4.5, creatinine 1.27, hgb 14.3, pro-BNP 284  PMH: 1. HTN 2. Paroxysmal atrial fibrillation/flutter: Failed Tikosyn.  Had Maze procedure 11/19 but does not appear to have ever gone back into NSR.  Had been in atrial fibrillation with bradycardia with rate in 30s-40s at times, now in atypical atrial flutter primarily with rate 100s-110s.  He failed DCCV 8/20.  No amiodarone due to h/o bradycardia.  - Atrial fibrillation + atrial flutter ablation in 10/20.  3. OSA: Uses CPAP.  4. Chronic systolic CHF: Nonischemic cardiomyopathy.  - 10/19 LHC: No coronary disease.  - Echo (4/20): EF 40-45%, mild LVH, moderate dilated RV with mildly decreased systolic function, s/p MV  repair with no significant stenosis or regurgitation, s/p aortic valve repair with mild AI, mild aortic stenosis.  Dilated IVC.  - Echo (9/20): EF 20-25%, mild LVH with moderate LV dilation, s/p MV repair with mean gradient 6, s/p AoV repair with mean gradient 8. No significant MR or AI.  - St Jude CRT-D implantation in 2/21.  - AV nodal ablation 6/21.  - Echo (10/21): EF 60-65%, mild LV enlargement and LVH, moderate RV enlargement with normal function, severe  LVE, s/p MV repair with no MR and mean gradient 3, s/p aortic valve repair with mean gradient 9 and no AI, ascending aorta 4.6 cm.  5. Valvular heart disease: Patient has history of MR and AI.  In 11/19, he had mitral valve repair, aortic valve repair, surgical Maze, and LA appendage excision.  6. LBBB 7. Ascending aorta aneurysm: 4.6 cm on 10/21 echo.  - CTA chest (7/22): 4.1 cm ascending aorta 8. Bilateral THRs  Social History   Socioeconomic History   Marital status: Married    Spouse name: Not on file   Number of children: Not on file   Years of education: Not on file   Highest education level: Not on file  Occupational History   Occupation: Agricultural consultant  Tobacco Use   Smoking status: Never   Smokeless tobacco: Never  Vaping Use   Vaping Use: Never used  Substance and Sexual Activity   Alcohol use: No   Drug use: No   Sexual activity: Yes  Other Topics Concern   Not on file  Social History Narrative   Pt lives in [place] with spouse.  Leadership training and behavioral safety training.   Social Determinants of Health   Financial Resource Strain: Not on file  Food Insecurity: Not on file  Transportation Needs: Not on file  Physical Activity: Not on file  Stress: Not on file  Social Connections: Not on file  Intimate Partner Violence: Not on file   Family History  Problem Relation Age of Onset   Other Other        mother had a pacemaker   Hypertension Father    ROS: All systems reviewed and negative except as per HPI.   Current Outpatient Medications  Medication Sig Dispense Refill   acetaminophen (TYLENOL) 325 MG tablet Take 2 tablets (650 mg total) by mouth every 4 (four) hours as needed for headache or mild pain.     Ascorbic Acid (VITAMIN C) 1000 MG tablet Take 1,000 mg by mouth daily.     carvedilol (COREG) 6.25 MG tablet Take 1 tablet (6.25 mg total) by mouth 2 (two) times daily with a meal. 180 tablet 3   clobetasol ointment  (TEMOVATE) 5.05 % Apply 1 application topically daily as needed (rash).     diclofenac Sodium (VOLTAREN) 1 % GEL Apply 2 g topically daily as needed (pain).      ENTRESTO 24-26 MG TAKE 1 TABLET BY MOUTH TWICE A DAY 90 tablet 3   HYDROcodone-acetaminophen (NORCO/VICODIN) 5-325 MG tablet Take 1 tablet by mouth every 8 (eight) hours as needed.     ivermectin (STROMECTOL) 3 MG TABS tablet Take by mouth as needed.     MAGNESIUM PO Take 1 tablet by mouth 3 (three) times a week.     Multiple Vitamin (MULTIVITAMIN WITH MINERALS) TABS tablet Take 1 tablet by mouth daily.      potassium chloride SA (KLOR-CON M) 20 MEQ tablet Take 1 tablet (20 mEq total) by mouth  daily. 90 tablet 3   Probiotic Product (PROBIOTIC PO) Take 1 capsule by mouth daily.     rivaroxaban (XARELTO) 20 MG TABS tablet TAKE ONE TABLET BY MOUTH DAILY WITH SUPPER 30 tablet 10   spironolactone (ALDACTONE) 25 MG tablet TAKE 1 TABLET BY MOUTH EVERY DAY 90 tablet 3   tadalafil (CIALIS) 10 MG tablet Take 10 mg by mouth daily as needed.     torsemide (DEMADEX) 20 MG tablet Take 1 tablet (20 mg total) by mouth daily. 90 tablet 3   zinc gluconate 50 MG tablet Take 50 mg by mouth 3 (three) times a week.     No current facility-administered medications for this encounter.   BP 140/80   Pulse 62   Wt 125.2 kg (276 lb)   SpO2 95%   BMI 39.60 kg/m  General: NAD Neck: Thick.  No JVD, no thyromegaly or thyroid nodule.  Lungs: Clear to auscultation bilaterally with normal respiratory effort. CV: Nondisplaced PMI.  Heart regular S1/S2, no S3/S4, no murmur.  No peripheral edema.  No carotid bruit.  Normal pedal pulses.  Abdomen: Soft, nontender, no hepatosplenomegaly, no distention.  Skin: Intact without lesions or rashes.  Neurologic: Alert and oriented x 3.  Psych: Normal affect. Extremities: No clubbing or cyanosis.  HEENT: Normal.   Assessment/Plan: 1. Chronic systolic CHF: Echo in 4/76 with EF 40-45%, moderate RV dilation/mildly  decreased RV function.  Nonischemic cardiomyopathy, cath in 10/19 without significant coronary disease.  Repeat echo in 9/20 showed fall in EF to 20-25% in the setting of atrial fibrillation and atrial flutter with persistent tachycardia.  He is now s/p St Jude CRT-D and AV nodal ablation.  Echo in 10/21 with EF up to 60-65%.  NYHA class II symptoms.  Though he is not very symptomatic, Corvue suggests volume accumulation since he went back into atrial fibrillation.   - I think he would benefit from being back in NSR, see below.  - For now, continue torsemide 20 mg daily.  BMET today.   - Continue spironolactone 25 mg daily.   - Continue Entresto 24/26 bid.    - Continue Coreg 6.25 mg bid.  - I will arrange for repeat echo.  2. Atrial arrhythmias: Long history of chronic atrial fibrillation, has had slow ventricular response in the past (HR 40s at times). He failed Tikosyn and have avoided amiodarone with bradycardia.  He had Maze procedure in 11/19 but it was not successful.  At initial appointment with me, he was in atypical flutter with HR 110s.  He failed DCCV in 8/20.  He had flutter/fibrillation ablation in 10/20.  Initially after ablation, he was in slow atrial fibrillation (rate upper 30s-50s generally).  He is now s/p AV nodal ablation with CRT-D.  He had generally been in NSR, but since 4/23 has been back in atrial fibrillation.  Though he is minimally symptomatic, it looks like he is developing fluid overload based on Corvue.   - I would like to keep him in NSR.  I will arrange for DCCV. He has not missed any Xarelto doses.  We discussed risks/benefits and he agrees to procedure.   - He will continue Xarelto.  CBC today.  3. OSA: Continue CPAP.  4. Valvular heart disease: S/p MV and AoV repairs in 11/19.  Valves looked stable on last echo in 10/21.  - He will need antibiotic prophylaxis with dental work.  - Repeat echo as above.  5. Ascending aortic aneurysm: 4.6 cm on 10/21 echo.  CTA chest  in 7/22 showed 4.1 cm ascending aorta.   Followup in 6 wks after DCCV.   [name] 12/10/2021

## 2021-12-11 DIAGNOSIS — M25551 Pain in right hip: Secondary | ICD-10-CM | POA: Diagnosis not present

## 2021-12-12 ENCOUNTER — Encounter (HOSPITAL_COMMUNITY): Payer: Self-pay | Admitting: Cardiology

## 2021-12-13 NOTE — Progress Notes (Signed)
Attempted to obtain medical history via telephone, unable to reach at this time. HIPAA compliant voicemail message left requesting return call to pre surgical testing department. 

## 2021-12-17 ENCOUNTER — Ambulatory Visit (INDEPENDENT_AMBULATORY_CARE_PROVIDER_SITE_OTHER): Payer: Medicare HMO

## 2021-12-17 DIAGNOSIS — I5022 Chronic systolic (congestive) heart failure: Secondary | ICD-10-CM

## 2021-12-17 DIAGNOSIS — Z95810 Presence of automatic (implantable) cardiac defibrillator: Secondary | ICD-10-CM

## 2021-12-17 NOTE — Progress Notes (Signed)
EPIC Encounter for ICM Monitoring  Patient Name: [name] is a 71 y.o. male Date: 12/17/2021 Primary Care Physican: Seward Carol, MD Primary Cardiologist: Aundra Dubin Electrophysiologist: Allred Bi-V Pacing: >99%        11/19/2021  Weight: 272 lbs  11/25/2021 Weight: 272 lbs 12/02/2021 Office Weight: 267 lbs 12/10/2021 Office Weight: 276 lbs 12/17/2021 Weight:  Has not weighed since last OV                                                           AT/AF Burden: 25% (taking Xarelto)    Spoke with patient and heart failure questions reviewed.  Cardioversion scheduled 6/9.     CorVue thoracic impedance suggesting fluid levels have returned to normal.   Prescribed: Torsemide 20 mg take 1 tablet (20 mg total) daily.   Klor Con 20 mEq take take 1 tablet daily  Spironolactone 25 mg take 1 tablet daily   Labs: 12/10/2021 Creatinine 1.33, BUN 29, Potassium 4.3, Sodium 139, GFR 58 A complete set of results can be found in Results Review.   Recommendations:  No changes and encouraged to call if experiencing any fluid symptoms.      Follow-up plan: ICM clinic phone appointment on 01/06/2022.   91 day device clinic remote transmission 03/03/2022.     EP/Cardiology Office Visits:  Recall 07/04/2022 with Oda Kilts, [place].   01/17/2022 with Dr. Aundra Dubin.   Copy of ICM check sent to Dr. Rayann Heman.  3 month ICM trend: 12/17/2021.    12-14 Month ICM trend:     Rosalene Billings, RN 12/17/2021 4:38 PM

## 2021-12-19 ENCOUNTER — Telehealth (HOSPITAL_COMMUNITY): Payer: Self-pay

## 2021-12-19 NOTE — Telephone Encounter (Signed)
VM left for patient about procedure for tomorrow. Any questions phone number left for patient to call.

## 2021-12-19 NOTE — Progress Notes (Signed)
Remote ICD transmission.   

## 2021-12-20 ENCOUNTER — Other Ambulatory Visit: Payer: Self-pay

## 2021-12-20 ENCOUNTER — Ambulatory Visit (HOSPITAL_COMMUNITY): Payer: Medicare HMO | Admitting: Anesthesiology

## 2021-12-20 ENCOUNTER — Ambulatory Visit (HOSPITAL_BASED_OUTPATIENT_CLINIC_OR_DEPARTMENT_OTHER): Payer: Medicare HMO | Admitting: Anesthesiology

## 2021-12-20 ENCOUNTER — Ambulatory Visit (HOSPITAL_COMMUNITY)
Admission: RE | Admit: 2021-12-20 | Discharge: 2021-12-20 | Disposition: A | Discharge status: Home / Self Care | Payer: Medicare HMO | Attending: Cardiology | Admitting: Cardiology

## 2021-12-20 ENCOUNTER — Encounter (HOSPITAL_COMMUNITY): Payer: Self-pay | Admitting: Cardiology

## 2021-12-20 ENCOUNTER — Other Ambulatory Visit: Payer: Self-pay | Admitting: Cardiology

## 2021-12-20 ENCOUNTER — Encounter (HOSPITAL_COMMUNITY)
Admission: RE | Disposition: A | Discharge status: Home / Self Care | Payer: Self-pay | Source: Home / Self Care | Attending: Cardiology

## 2021-12-20 ENCOUNTER — Telehealth: Payer: Self-pay

## 2021-12-20 DIAGNOSIS — G4733 Obstructive sleep apnea (adult) (pediatric): Secondary | ICD-10-CM

## 2021-12-20 DIAGNOSIS — I4892 Unspecified atrial flutter: Secondary | ICD-10-CM | POA: Diagnosis not present

## 2021-12-20 DIAGNOSIS — Z7901 Long term (current) use of anticoagulants: Secondary | ICD-10-CM | POA: Insufficient documentation

## 2021-12-20 DIAGNOSIS — Z95810 Presence of automatic (implantable) cardiac defibrillator: Secondary | ICD-10-CM | POA: Insufficient documentation

## 2021-12-20 DIAGNOSIS — I7121 Aneurysm of the ascending aorta, without rupture: Secondary | ICD-10-CM | POA: Insufficient documentation

## 2021-12-20 DIAGNOSIS — I4891 Unspecified atrial fibrillation: Secondary | ICD-10-CM

## 2021-12-20 DIAGNOSIS — I480 Paroxysmal atrial fibrillation: Secondary | ICD-10-CM | POA: Diagnosis not present

## 2021-12-20 DIAGNOSIS — I110 Hypertensive heart disease with heart failure: Secondary | ICD-10-CM | POA: Diagnosis not present

## 2021-12-20 DIAGNOSIS — I428 Other cardiomyopathies: Secondary | ICD-10-CM | POA: Diagnosis not present

## 2021-12-20 DIAGNOSIS — Z79899 Other long term (current) drug therapy: Secondary | ICD-10-CM | POA: Diagnosis not present

## 2021-12-20 DIAGNOSIS — I509 Heart failure, unspecified: Secondary | ICD-10-CM

## 2021-12-20 DIAGNOSIS — I484 Atypical atrial flutter: Secondary | ICD-10-CM

## 2021-12-20 DIAGNOSIS — Z9989 Dependence on other enabling machines and devices: Secondary | ICD-10-CM | POA: Diagnosis not present

## 2021-12-20 DIAGNOSIS — I5022 Chronic systolic (congestive) heart failure: Secondary | ICD-10-CM | POA: Diagnosis not present

## 2021-12-20 HISTORY — PX: CARDIOVERSION: SHX1299

## 2021-12-20 LAB — POCT I-STAT, CHEM 8
BUN: 29 mg/dL — ABNORMAL HIGH (ref 8–23)
Calcium, Ion: 1.2 mmol/L (ref 1.15–1.40)
Chloride: 105 mmol/L (ref 98–111)
Creatinine, Ser: 1.2 mg/dL (ref 0.61–1.24)
Glucose, Bld: 95 mg/dL (ref 70–99)
HCT: 42 % (ref 39.0–52.0)
Hemoglobin: 14.3 g/dL (ref 13.0–17.0)
Potassium: 4.1 mmol/L (ref 3.5–5.1)
Sodium: 140 mmol/L (ref 135–145)
TCO2: 24 mmol/L (ref 22–32)

## 2021-12-20 SURGERY — CARDIOVERSION
Anesthesia: General

## 2021-12-20 MED ORDER — PROPOFOL 10 MG/ML IV BOLUS
INTRAVENOUS | Status: DC | PRN
  Administered 2021-12-20: 60 mg via INTRAVENOUS

## 2021-12-20 MED ORDER — SODIUM CHLORIDE 0.9 % IV SOLN: INTRAVENOUS | Status: DC

## 2021-12-20 MED ORDER — LIDOCAINE 2% (20 MG/ML) 5 ML SYRINGE
INTRAMUSCULAR | Status: DC | PRN
  Administered 2021-12-20: 100 mg via INTRAVENOUS

## 2021-12-20 NOTE — Transfer of Care (Signed)
Immediate Anesthesia Transfer of Care Note  Patient: [name]  Procedure(s) Performed: CARDIOVERSION  Patient Location: Short Stay   Anesthesia Type:General  Level of Consciousness: drowsy  Airway & Oxygen Therapy: Patient Spontanous Breathing  Post-op Assessment: Report given to RN and Post -op Vital signs reviewed and stable  Post vital signs: Reviewed and stable  Last Vitals:  Vitals Value Taken Time  BP 146/96   Temp    Pulse 66   Resp 12   SpO2 99     Last Pain:  Vitals:   12/20/21 1120  TempSrc: Temporal  PainSc: 0-No pain         Complications: No notable events documented.

## 2021-12-20 NOTE — Procedures (Signed)
Electrical Cardioversion Procedure Note [name] [id number] Sep 09, 1950  Procedure: Electrical Cardioversion Indications:  Atrial Fibrillation  Procedure Details Consent: Risks of procedure as well as the alternatives and risks of each were explained to the (patient/caregiver).  Consent for procedure obtained. Time Out: Verified patient identification, verified procedure, site/side was marked, verified correct patient position, special equipment/implants available, medications/allergies/relevent history reviewed, required imaging and test results available.  Performed  Patient placed on cardiac monitor, pulse oximetry, supplemental oxygen as necessary.  Sedation given:  propofol per anesthesiology Pacer pads placed anterior and posterior chest.  Cardioverted 1 time(s).  Cardioverted at [place].  Evaluation Findings: Post procedure EKG shows: NSR Complications: None Patient did tolerate procedure well.   [name] 12/20/2021, 1:10 PM

## 2021-12-20 NOTE — Telephone Encounter (Signed)
New Message::     Larene Beach RN at [facility] is calling to seed if patient is in Sinus Rhythmin

## 2021-12-20 NOTE — Anesthesia Procedure Notes (Signed)
Procedure Name: General with mask airway Date/Time: 12/20/2021 12:51 PM  Performed by: Erick Colace, CRNAPre-anesthesia Checklist: Patient identified, Emergency Drugs available, Suction available and Patient being monitored Patient Re-evaluated:Patient Re-evaluated prior to induction Oxygen Delivery Method: Ambu bag Preoxygenation: Pre-oxygenation with 100% oxygen Induction Type: IV induction Dental Injury: Teeth and Oropharynx as per pre-operative assessment

## 2021-12-20 NOTE — Anesthesia Preprocedure Evaluation (Addendum)
Anesthesia Evaluation  Patient identified by MRN, date of birth, ID band Patient awake    Reviewed: Allergy & Precautions, NPO status , Patient's Chart, lab work & pertinent test results, reviewed documented beta blocker date and time   History of Anesthesia Complications (+) PONV and history of anesthetic complications  Airway Mallampati: III  TM Distance: >3 FB Neck ROM: Full    Dental  (+) Dental Advisory Given   Pulmonary sleep apnea and Continuous Positive Airway Pressure Ventilation ,  Covid-19 Nucleic Acid Test Results Lab Results      Component                Value               Date                      [place]              NEGATIVE            05/25/2020                [place]              NEGATIVE            12/17/2019                [place]              NEGATIVE            08/19/2019                [place]              NOT DETECTED        04/23/2019                [place]              NEGATIVE            03/07/2019              breath sounds clear to auscultation       Cardiovascular hypertension, Pt. on home beta blockers +CHF  + Cardiac Defibrillator + Valvular Problems/Murmurs AI  Rhythm:Irregular Rate:Normal  1. Left ventricular ejection fraction, by estimation, is 60 to 65%. The  left ventricle has normal function. The left ventricle has no regional  wall motion abnormalities. The left ventricular internal cavity size was  mildly dilated. There is mild left  ventricular hypertrophy. Left ventricular diastolic parameters are  indeterminate.  2. Right ventricular systolic function is normal. The right ventricular  size is moderately enlarged. There is normal pulmonary artery systolic  pressure. The estimated right ventricular systolic pressure is 34.2 mmHg.  3. Left atrial size was severely dilated.  4. Right atrial size was mildly dilated.  5. The mitral valve has been  repaired/replaced. There is a prosthetic  annuloplasty ring present in the mitral position. No evidence of mitral  valve regurgitation. Mean gradient 3 mmHg at 60bpm.  6. The aortic valve has been repaired/replaced. There is a prosthetic  annuloplasty ring present in the aortic position. Aortic valve  regurgitation is trivial. Aortic valve mean gradient measures 9.0 mmHg.  7. The inferior vena cava is normal in size with greater than 50%  respiratory variability, suggesting right atrial pressure of 3 mmHg.  8. Aortic dilatation noted. Aneurysm of the ascending aorta, measuring 46  mm.   S/p AVR, MVR, MAze  There is no evidence of ischemia  The LV is moderately dilated  The study is not gated so the LV function was not assessed.  Suggest echo for assessment of LV function .  Intermittant risk myoview based on LV dilitation and likely reduced EF   1. No angiographically significant coronary artery disease. 2. Mildly to moderately elevated left heart filling pressures.  Prominent V-waves and elevated PCWP relative to LVEDP support moderate to severe mitral regurgitation noted on today's TEE. 3. Moderately elevated right heart and pulmonary artery pressures. 4. Moderately reduced Fick cardiac output/index. 5. New LBBB after crossing the aortic valve for left heart catheterization.    Neuro/Psych negative neurological ROS  negative psych ROS   GI/Hepatic negative GI ROS, Neg liver ROS,   Endo/Other  negative endocrine ROS  Renal/GU Renal InsufficiencyRenal diseaseLab Results      Component                Value               Date                      CREATININE               1.46 (H)            05/18/2020           Lab Results      Component                Value               Date                      K                        4.6                 05/18/2020                Musculoskeletal  (+) Arthritis ,   Abdominal   Peds  Hematology Lab Results      Component                 Value               Date                      WBC                      7.9                 05/18/2020                HGB                      13.7                05/18/2020                HCT                      41.2                05/18/2020                MCV  87.7                05/18/2020                PLT                      162                 05/18/2020            xarelto last dose 11/13 0630   Anesthesia Other Findings \  Reproductive/Obstetrics                            Anesthesia Physical  Anesthesia Plan  ASA: 3  Anesthesia Plan: General   Post-op Pain Management: Minimal or no pain anticipated   Induction: Intravenous  PONV Risk Score and Plan: 2 and Treatment may vary due to age or medical condition, TIVA and Propofol infusion  Airway Management Planned: Mask  Additional Equipment: None  Intra-op Plan:   Post-operative Plan:   Informed Consent: I have reviewed the patients History and Physical, chart, labs and discussed the procedure including the risks, benefits and alternatives for the proposed anesthesia with the patient or authorized representative who has indicated his/her understanding and acceptance.     Dental advisory given  Plan Discussed with: Anesthesiologist and CRNA  Anesthesia Plan Comments:        Anesthesia Quick Evaluation

## 2021-12-20 NOTE — Discharge Instructions (Signed)

## 2021-12-20 NOTE — Interval H&P Note (Signed)
History and Physical Interval Note:  12/20/2021 1:02 PM  [name]  has presented today for surgery, with the diagnosis of afib.  The various methods of treatment have been discussed with the patient and family. After consideration of risks, benefits and other options for treatment, the patient has consented to  Procedure(s): CARDIOVERSION (N/A) as a surgical intervention.  The patient's history has been reviewed, patient examined, no change in status, stable for surgery.  I have reviewed the patient's chart and labs.  Questions were answered to the patient's satisfaction.     [name] [employer]

## 2021-12-22 NOTE — Anesthesia Postprocedure Evaluation (Signed)
Anesthesia Post Note  Patient: [name]  Procedure(s) Performed: CARDIOVERSION     Patient location during evaluation: Endoscopy Anesthesia Type: General Level of consciousness: awake and alert Pain management: pain level controlled Vital Signs Assessment: post-procedure vital signs reviewed and stable Respiratory status: spontaneous breathing, nonlabored ventilation, respiratory function stable and patient connected to nasal cannula oxygen Cardiovascular status: blood pressure returned to baseline and stable Postop Assessment: no apparent nausea or vomiting Anesthetic complications: no   No notable events documented.  Last Vitals:  Vitals:   12/20/21 1320 12/20/21 1330  BP: 101/60 108/62  Pulse: 60 61  Resp: 15 11  Temp:    SpO2: 97% 95%    Last Pain:  Vitals:   12/20/21 1330  TempSrc:   PainSc: 0-No pain                 [name] DANIEL

## 2021-12-23 ENCOUNTER — Encounter (HOSPITAL_COMMUNITY): Payer: Self-pay | Admitting: Cardiology

## 2021-12-23 NOTE — Telephone Encounter (Signed)
Spoke with patient.  He advised the hospital was trying to call while he was at cath lab 6/9 to review transmission to see if he was still out of rhythm.  He said the rep for St Jude came to cath lab to confirm rhythm. He had the cardioversion procedure completed 6/9 and feeling fine today.  Advised call back if changes in condition.

## 2021-12-27 DIAGNOSIS — G4733 Obstructive sleep apnea (adult) (pediatric): Secondary | ICD-10-CM | POA: Diagnosis not present

## 2022-01-06 ENCOUNTER — Ambulatory Visit: Payer: Medicare HMO

## 2022-01-06 DIAGNOSIS — M1611 Unilateral primary osteoarthritis, right hip: Secondary | ICD-10-CM | POA: Diagnosis not present

## 2022-01-06 DIAGNOSIS — Z95810 Presence of automatic (implantable) cardiac defibrillator: Secondary | ICD-10-CM

## 2022-01-06 DIAGNOSIS — I480 Paroxysmal atrial fibrillation: Secondary | ICD-10-CM | POA: Diagnosis not present

## 2022-01-06 DIAGNOSIS — I5022 Chronic systolic (congestive) heart failure: Secondary | ICD-10-CM

## 2022-01-06 DIAGNOSIS — I10 Essential (primary) hypertension: Secondary | ICD-10-CM | POA: Diagnosis not present

## 2022-01-08 NOTE — Progress Notes (Signed)
EPIC Encounter for ICM Monitoring  Patient Name: [name] is a 71 y.o. male Date: 01/08/2022 Primary Care Physican: Seward Carol, MD Primary Cardiologist: Aundra Dubin Electrophysiologist: Allred Bi-V Pacing: >99%        11/19/2021  Weight: 272 lbs  11/25/2021 Weight: 272 lbs 12/02/2021 Office Weight: 267 lbs 12/10/2021 Office Weight: 276 lbs 12/17/2021 Weight:  Has not weighed since last OV                                                          AT/AF Burden: 29% (taking Xarelto)    Spoke with patient and heart failure questions reviewed. He is feeling well at this time.   He moved from his home to an apartment and glad to have that done.    CorVue thoracic impedance suggesting normal fluid levels.   Prescribed: Torsemide 20 mg take 1 tablet (20 mg total) daily.   Klor Con 20 mEq take take 1 tablet daily  Spironolactone 25 mg take 1 tablet daily   Labs: 12/10/2021 Creatinine 1.33, BUN 29, Potassium 4.3, Sodium 139, GFR 58 A complete set of results can be found in Results Review.   Recommendations:  No changes and encouraged to call if experiencing any fluid symptoms.      Follow-up plan: ICM clinic phone appointment on 02/10/2022.   91 day device clinic remote transmission 03/03/2022.     EP/Cardiology Office Visits:  Recall 07/04/2022 with Oda Kilts, [place].   01/17/2022 with Dr. Aundra Dubin.   Copy of ICM check sent to Dr. Rayann Heman.  3 month ICM trend: 01/06/2022.       12-14 Month ICM trend:     Rosalene Billings, RN 01/08/2022 1:51 PM

## 2022-01-17 ENCOUNTER — Encounter (HOSPITAL_COMMUNITY): Payer: Self-pay | Admitting: Cardiology

## 2022-01-17 ENCOUNTER — Ambulatory Visit (HOSPITAL_COMMUNITY)
Admission: RE | Admit: 2022-01-17 | Discharge: 2022-01-17 | Disposition: A | Discharge status: Home / Self Care | Payer: Medicare HMO | Source: Ambulatory Visit | Attending: Cardiology | Admitting: Cardiology

## 2022-01-17 ENCOUNTER — Encounter: Payer: Self-pay | Admitting: Cardiology

## 2022-01-17 ENCOUNTER — Telehealth (HOSPITAL_COMMUNITY): Payer: Self-pay | Admitting: Pharmacy Technician

## 2022-01-17 ENCOUNTER — Ambulatory Visit (HOSPITAL_BASED_OUTPATIENT_CLINIC_OR_DEPARTMENT_OTHER)
Admission: RE | Admit: 2022-01-17 | Discharge: 2022-01-17 | Disposition: A | Discharge status: Home / Self Care | Payer: Medicare HMO | Source: Ambulatory Visit | Attending: Cardiology | Admitting: Cardiology

## 2022-01-17 VITALS — BP 140/80 | HR 54 | Wt 275.4 lb

## 2022-01-17 DIAGNOSIS — I5022 Chronic systolic (congestive) heart failure: Secondary | ICD-10-CM | POA: Insufficient documentation

## 2022-01-17 DIAGNOSIS — I110 Hypertensive heart disease with heart failure: Secondary | ICD-10-CM | POA: Insufficient documentation

## 2022-01-17 DIAGNOSIS — I428 Other cardiomyopathies: Secondary | ICD-10-CM | POA: Diagnosis not present

## 2022-01-17 DIAGNOSIS — Z7901 Long term (current) use of anticoagulants: Secondary | ICD-10-CM | POA: Insufficient documentation

## 2022-01-17 DIAGNOSIS — Z79899 Other long term (current) drug therapy: Secondary | ICD-10-CM | POA: Insufficient documentation

## 2022-01-17 DIAGNOSIS — I480 Paroxysmal atrial fibrillation: Secondary | ICD-10-CM | POA: Diagnosis not present

## 2022-01-17 DIAGNOSIS — R000 Tachycardia, unspecified: Secondary | ICD-10-CM | POA: Insufficient documentation

## 2022-01-17 DIAGNOSIS — G4733 Obstructive sleep apnea (adult) (pediatric): Secondary | ICD-10-CM | POA: Insufficient documentation

## 2022-01-17 DIAGNOSIS — Z9889 Other specified postprocedural states: Secondary | ICD-10-CM | POA: Insufficient documentation

## 2022-01-17 DIAGNOSIS — I4892 Unspecified atrial flutter: Secondary | ICD-10-CM | POA: Insufficient documentation

## 2022-01-17 LAB — BASIC METABOLIC PANEL
Anion gap: 11 (ref 5–15)
BUN: 34 mg/dL — ABNORMAL HIGH (ref 8–23)
CO2: 25 mmol/L (ref 22–32)
Calcium: 9.5 mg/dL (ref 8.9–10.3)
Chloride: 102 mmol/L (ref 98–111)
Creatinine, Ser: 1.4 mg/dL — ABNORMAL HIGH (ref 0.61–1.24)
GFR, Estimated: 54 mL/min — ABNORMAL LOW (ref >60)
Glucose, Bld: 90 mg/dL (ref 70–99)
Potassium: 4 mmol/L (ref 3.5–5.1)
Sodium: 138 mmol/L (ref 135–145)

## 2022-01-17 LAB — LIPID PANEL
Cholesterol: 187 mg/dL (ref 0–200)
HDL: 39 mg/dL — ABNORMAL LOW (ref >40)
LDL Cholesterol: 117 mg/dL — ABNORMAL HIGH (ref 0–99)
Total CHOL/HDL Ratio: 4.8 RATIO
Triglycerides: 155 mg/dL — ABNORMAL HIGH (ref <150)
VLDL: 31 mg/dL (ref 0–40)

## 2022-01-17 LAB — CBC
HCT: 44.9 % (ref 39.0–52.0)
Hemoglobin: 15 g/dL (ref 13.0–17.0)
MCH: 29.3 pg (ref 26.0–34.0)
MCHC: 33.4 g/dL (ref 30.0–36.0)
MCV: 87.7 fL (ref 80.0–100.0)
Platelets: 147 10*3/uL — ABNORMAL LOW (ref 150–400)
RBC: 5.12 MIL/uL (ref 4.22–5.81)
RDW: 13.2 % (ref 11.5–15.5)
WBC: 9.6 10*3/uL (ref 4.0–10.5)
nRBC: 0 % (ref 0.0–0.2)

## 2022-01-17 LAB — ECHOCARDIOGRAM COMPLETE
AR max vel: 2.27 cm2
AV Area VTI: 2.21 cm2
AV Area mean vel: 2.21 cm2
AV Mean grad: 11 mmHg
AV Peak grad: 16.5 mmHg
Ao pk vel: 2.03 m/s
Area-P 1/2: 3.34 cm2
MV VTI: 2.13 cm2
P 1/2 time: 678 msec
S' Lateral: 3.7 cm

## 2022-01-17 NOTE — Patient Instructions (Signed)
Medication Changes:  No changes, continue current medications  Lab Work:  Labs done today, your results will be available in MyChart, we will contact you for abnormal readings.  Testing/Procedures:  None  Referrals:  None  Special Instructions // Education:  Do the following things EVERYDAY: Weigh yourself in the morning before breakfast. Write it down and keep it in a log. Take your medicines as prescribed Eat low salt foods--Limit salt (sodium) to 2000 mg per day.  Stay as active as you can everyday Limit all fluids for the day to less than 2 liters  Follow-Up in: 4 months (November 2023), **PLEASE CALL OUR OFFICE IN SEPTEMBER TO SCHEDULE THIS APPOINTMENT  At [facility], you and your health needs are our priority. We have a designated team specialized in the treatment of Heart Failure. This Care Team includes your primary Heart Failure Specialized Cardiologist (physician), Advanced Practice Providers (APPs- Physician Assistants and Nurse Practitioners), and Pharmacist who all work together to provide you with the care you need, when you need it.   You may see any of the following providers on your designated Care Team at your next follow up:  Dr Glori Bickers Dr Haynes Kerns, NP Lyda Jester, [place] [facility] [place], [place] Audry Riles, PharmD   Please be sure to bring in all your medications bottles to every appointment.   Need to Contact us:  If you have any questions or concerns before your next appointment please send [place] a message through [place] or call our office at [phone].    TO LEAVE A MESSAGE FOR THE NURSE SELECT OPTION 2, PLEASE LEAVE A MESSAGE INCLUDING: YOUR NAME DATE OF BIRTH CALL BACK NUMBER REASON FOR CALL**this is important as we prioritize the call backs  YOU WILL RECEIVE A CALL BACK THE SAME DAY AS LONG AS YOU CALL BEFORE 4:00 PM

## 2022-01-17 NOTE — Telephone Encounter (Signed)
Advanced Heart Failure Patient Advocate Encounter  The patient was approved for a [place] that will help cover the cost of Entresto. Total amount awarded, $10,000. Eligibility, 12/18/21 - 12/18/22.  ID [id number]  BIN [id number]  PCN [id number]  Group [id number]  Patient was given a copy of the grant information to take to the pharmacy.  Charlann Boxer, CPhT

## 2022-01-19 NOTE — Progress Notes (Signed)
PCP: Seward Carol, MD EP: Dr. Rayann Heman Cardiology: Dr. Gwenlyn Found HF Cardiology: Dr. Aundra Dubin  71 y.o. with history of chronic atrial fibrillation and flutter with failed Maze procedure, valvular heart disease s/p mitral and aortic valve repairs, and chronic systolic CHF was referred by Dr. Rayann Heman for CHF evaluation.  Patient has a long history of atrial fibrillation.  He was initially controlled by Tikosyn but it eventually became chronic.  He had significant mitral and aortic regurgitation, and in 11/19, he had mitral and aortic valve repairs with Maze and LA appendage ligation. Cath prior to valve surgery in 10/19 showed no significant coronary disease.  It appears from ECGs that he never went back into NSR.  He has had slow atrial fibrillation with rate in upper 30s-40s at times, so was never started on amiodarone.  Last echo in 4/20 showed EF 40-45% with stable aortic and mitral valve repairs.  He has developed worsening volume overload, and Lasix has been titrated up to 80 mg bid.  Most recently, he went into atypical atrial flutter with HR consistently in the 110s range.  He therefore was taken for DCCV 03/10/19.  Initially rhythm looked junctional in the 40s-50s, but he subsequently went back into rapid atypical atrial flutter with elevated rate (>100 bpm).   Echo was done in 9/20, showing EF worse at 20-25%.     He saw Dr. Rayann Heman and had atrial flutter + atrial fibrillation ablation in 10/20.  Post-procedure, he was noted to be in slow atrial fibrillation (rate 40s) at time of his atrial fibrillation clinic followup.   Patient had St Jude CRT-D implantation in 2/21.  In 6/21, he had AV nodal ablation to allow CRT given recurrent atrial flutter/fibrillation. Echo in 10/21 showed EF up to 60-65%.    He was seen in atrial fibrillation clinic in 5/23 and was noted to be in atrial fibrillation since 4/23.  Thoracic impedance suggested fluid retention. He was in atrial fibrillation when I saw him in 5/23 and  was set up for DCCV in 6/23. He went back into NSR at the time but is in atrial fibrillation again today.   Echo was done today and reviewed, EF 50-55%, global hypokinesis, mildly dilated and mildly dysfunctional RV, s/p MV repair with no MR and mean gradient 4, s/p aortic valve repair with mild AS mean gradient 11 and mild AI, normal IVC.   He returns today for followup of CHF.  He is still in atrial fibrillation.  Weight is down 1 lb.  He feels good overall, no exertional dyspnea or chest pain.  Only limitation at this point is chronic left knee pain from osteoarthritis.  No orthopnea/PND.   ECG (personally reviewed): Atrial fibrillation with BiV pacing  St Jude device interrogation: >99% BiV pacing, thoracic impedance stable, he is in atrial fibrillation persistently.   Labs (8/20): K 3.9, creatinine 1.14, BNP 473, hgb 13.3 Labs (9/20): K 3.3 => 3.7, creatinine 1.35 => 1.3 Labs (12/20): K 4.1, creatinine 1.2 Labs (1/21): K 4.1, creatinine 1.39 Labs (8/21): K 4.3, creatinine 1.42, plts 136 Labs (12/21): K 3.3, creatinine 1.27 Labs (12/22): K 4.5, creatinine 1.27, hgb 14.3, pro-BNP 284 Labs (6/23): K 4.1, creatinine 1.2  PMH: 1. HTN 2. Paroxysmal atrial fibrillation/flutter: Failed Tikosyn.  Had Maze procedure 11/19 but does not appear to have ever gone back into NSR.  Had been in atrial fibrillation with bradycardia with rate in 30s-40s at times, now in atypical atrial flutter primarily with rate 100s-110s.  He failed  DCCV 8/20.  No amiodarone due to h/o bradycardia.  - Atrial fibrillation + atrial flutter ablation in 10/20.  - DCCV to NSR in 6/23 but quickly back into atrial fibrillation.  3. OSA: Uses CPAP.  4. Chronic systolic CHF: Nonischemic cardiomyopathy.  - 10/19 LHC: No coronary disease.  - Echo (4/20): EF 40-45%, mild LVH, moderate dilated RV with mildly decreased systolic function, s/p MV repair with no significant stenosis or regurgitation, s/p aortic valve repair with mild  AI, mild aortic stenosis.  Dilated IVC.  - Echo (9/20): EF 20-25%, mild LVH with moderate LV dilation, s/p MV repair with mean gradient 6, s/p AoV repair with mean gradient 8. No significant MR or AI.  - St Jude CRT-D implantation in 2/21.  - AV nodal ablation 6/21.  - Echo (10/21): EF 60-65%, mild LV enlargement and LVH, moderate RV enlargement with normal function, severe LVE, s/p MV repair with no MR and mean gradient 3, s/p aortic valve repair with mean gradient 9 and no AI, ascending aorta 4.6 cm.  - Echo (7/23): EF 50-55%, global hypokinesis, mildly dilated and mildly dysfunctional RV, s/p MV repair with no MR and mean gradient 4, s/p aortic valve repair with mild AS mean gradient 11 and mild AI, normal IVC.  5. Valvular heart disease: Patient has history of MR and AI.  In 11/19, he had mitral valve repair, aortic valve repair, surgical Maze, and LA appendage excision.  6. LBBB 7. Ascending aorta aneurysm: 4.6 cm on 10/21 echo.  - CTA chest (7/22): 4.1 cm ascending aorta 8. Bilateral THRs  Social History   Socioeconomic History   Marital status: Married    Spouse name: Not on file   Number of children: Not on file   Years of education: Not on file   Highest education level: Not on file  Occupational History   Occupation: Agricultural consultant  Tobacco Use   Smoking status: Never   Smokeless tobacco: Never  Vaping Use   Vaping Use: Never used  Substance and Sexual Activity   Alcohol use: No   Drug use: No   Sexual activity: Yes  Other Topics Concern   Not on file  Social History Narrative   Pt lives in [place] with spouse.  Leadership training and behavioral safety training.   Social Determinants of Health   Financial Resource Strain: Not on file  Food Insecurity: Not on file  Transportation Needs: No Transportation Needs (09/27/2019)   PRAPARE - Hydrologist (Medical): No    Lack of Transportation (Non-Medical): No   Physical Activity: Not on file  Stress: Not on file  Social Connections: Not on file  Intimate Partner Violence: Not on file   Family History  Problem Relation Age of Onset   Other Other        mother had a pacemaker   Hypertension Father    ROS: All systems reviewed and negative except as per HPI.   Current Outpatient Medications  Medication Sig Dispense Refill   acetaminophen (TYLENOL) 325 MG tablet Take 2 tablets (650 mg total) by mouth every 4 (four) hours as needed for headache or mild pain.     Ascorbic Acid (VITAMIN C) 1000 MG tablet Take 1,000 mg by mouth once a week.     carvedilol (COREG) 6.25 MG tablet Take 1 tablet (6.25 mg total) by mouth 2 (two) times daily with a meal. 180 tablet 3   clobetasol ointment (TEMOVATE) 0.05 %  Apply 1 application topically daily as needed (rash).     diclofenac Sodium (VOLTAREN) 1 % GEL Apply 2 g topically daily as needed (pain).      ENTRESTO 24-26 MG TAKE 1 TABLET BY MOUTH TWICE A DAY 90 tablet 3   HYDROcodone-acetaminophen (NORCO/VICODIN) 5-325 MG tablet Take 1 tablet by mouth every 8 (eight) hours as needed for moderate pain.     MAGNESIUM PO Take 1 tablet by mouth 3 (three) times a week.     Multiple Vitamin (MULTIVITAMIN WITH MINERALS) TABS tablet Take 1 tablet by mouth every 7 (seven) days.     NON FORMULARY Pt uses a cpap nightly     potassium chloride SA (KLOR-CON M) 20 MEQ tablet Take 1 tablet (20 mEq total) by mouth daily. 90 tablet 3   Probiotic Product (PROBIOTIC PO) Take 1 capsule by mouth once a week.     rivaroxaban (XARELTO) 20 MG TABS tablet TAKE ONE TABLET BY MOUTH DAILY WITH SUPPER 30 tablet 10   spironolactone (ALDACTONE) 25 MG tablet TAKE 1 TABLET BY MOUTH EVERY DAY 90 tablet 3   tadalafil (CIALIS) 10 MG tablet Take 10 mg by mouth daily as needed for erectile dysfunction.     torsemide (DEMADEX) 20 MG tablet Take 1 tablet (20 mg total) by mouth daily. 90 tablet 3   zinc gluconate 50 MG tablet Take 50 mg by mouth 3  (three) times a week.     No current facility-administered medications for this encounter.   BP 140/80   Pulse (!) 54   Wt 124.9 kg (275 lb 6.4 oz)   SpO2 94%   BMI 39.52 kg/m  General: NAD Neck: Thick. No JVD, no thyromegaly or thyroid nodule.  Lungs: Clear to auscultation bilaterally with normal respiratory effort. CV: Nondisplaced PMI.  Heart regular S1/S2, no S3/S4, no murmur.  No peripheral edema.  No carotid bruit.  Normal pedal pulses.  Abdomen: Soft, nontender, no hepatosplenomegaly, no distention.  Skin: Intact without lesions or rashes.  Neurologic: Alert and oriented x 3.  Psych: Normal affect. Extremities: No clubbing or cyanosis.  HEENT: Normal.   Assessment/Plan: 1. Chronic systolic CHF: Echo in 7/06 with EF 40-45%, moderate RV dilation/mildly decreased RV function.  Nonischemic cardiomyopathy, cath in 10/19 without significant coronary disease.  Repeat echo in 9/20 showed fall in EF to 20-25% in the setting of atrial fibrillation and atrial flutter with persistent tachycardia.  He is now s/p St Jude CRT-D and AV nodal ablation.  Echo in 10/21 with EF up to 60-65%. Echo today showed EF 50-55%, global hypokinesis, mildly dilated and mildly dysfunctional RV, s/p MV repair with no MR and mean gradient 4, s/p aortic valve repair with mild AS mean gradient 11 and mild AI, normal IVC.  NYHA class II symptoms.  He is not volume overloaded by exam or Corvue.  - Continue torsemide 20 mg daily.  BMET/BNP today.   - Continue spironolactone 25 mg daily.   - Continue Entresto 24/26 bid.    - Continue Coreg 6.25 mg bid.  - I will arrange for repeat echo.  2. Atrial arrhythmias: Long history of chronic atrial fibrillation, has had slow ventricular response in the past (HR 40s at times). He failed Tikosyn and have avoided amiodarone with bradycardia.  He had Maze procedure in 11/19 but it was not successful.  At initial appointment with me, he was in atypical flutter with HR 110s.  He  failed DCCV in 8/20.  He had flutter/fibrillation ablation  in 10/20.  Initially after ablation, he was in slow atrial fibrillation (rate upper 30s-50s generally).  He is now s/p AV nodal ablation with CRT-D.  He has been in atrial fibrillation persistently.  Cardioversion was attempted in 6/23 but did not hold.  As he is BiV pacing > 99% of the time in atrial fibrillation s/p AVN ablation, I think we can hold off on trying cardioversion again.  - He will continue Xarelto.  CBC today.  3. OSA: Continue CPAP.  4. Valvular heart disease: S/p MV and AoV repairs in 11/19.  Valves looked stable on today's echo.  - He will need antibiotic prophylaxis with dental work.  5. Ascending aortic aneurysm: 4.6 cm on 10/21 echo. CTA chest in 7/22 showed 4.1 cm ascending aorta.   Followup in 4 months.   [name] 01/19/2022

## 2022-01-26 DIAGNOSIS — G4733 Obstructive sleep apnea (adult) (pediatric): Secondary | ICD-10-CM | POA: Diagnosis not present

## 2022-02-03 ENCOUNTER — Telehealth (HOSPITAL_COMMUNITY): Payer: Self-pay | Admitting: Pharmacy Technician

## 2022-02-03 NOTE — Telephone Encounter (Signed)
Advanced Heart Failure Patient Advocate Encounter  Patient called and left a message requesting help with the PAN grant. The patient currently has a Horticulturist, commercial to cover the cost of [place]. Called and spoke with the patient. He is aware not to renew the PAN grant and to continue to use [employer].  Charlann Boxer, CPhT

## 2022-02-10 ENCOUNTER — Ambulatory Visit (INDEPENDENT_AMBULATORY_CARE_PROVIDER_SITE_OTHER): Payer: Medicare HMO

## 2022-02-10 DIAGNOSIS — I5022 Chronic systolic (congestive) heart failure: Secondary | ICD-10-CM | POA: Diagnosis not present

## 2022-02-10 DIAGNOSIS — Z95810 Presence of automatic (implantable) cardiac defibrillator: Secondary | ICD-10-CM | POA: Diagnosis not present

## 2022-02-11 NOTE — Progress Notes (Signed)
EPIC Encounter for ICM Monitoring  Patient Name: [name] is a 71 y.o. male Date: 02/11/2022 Primary Care Physican: Seward Carol, MD Primary Cardiologist: Aundra Dubin Electrophysiologist: Allred Bi-V Pacing: >99%        11/19/2021  Weight: 272 lbs  11/25/2021 Weight: 272 lbs 12/02/2021 Office Weight: 267 lbs 12/10/2021 Office Weight: 276 lbs 12/17/2021 Weight:  Has not weighed since last OV                                                          AT/AF Burden: 41% (taking Xarelto)    Spoke with patient and heart failure questions reviewed. He is feeling well at this time.      CorVue thoracic impedance suggesting possible fluid accumulation starting 7/29. Prescribed: Torsemide 20 mg take 1 tablet (20 mg total) daily.   Klor Con 20 mEq take take 1 tablet daily  Spironolactone 25 mg take 1 tablet daily   Labs: 01/17/2022 Creatinine 1.40, BUN 34, Potassium 4.0, Sodium 138, GFR 54 12/10/2021 Creatinine 1.33, BUN 29, Potassium 4.3, Sodium 139, GFR 58 A complete set of results can be found in Results Review.   Recommendations:  He took extra Torsemide on 7/31 and will be traveling starting tomorrow 8/2-8/13.  He will adjust Torsemide again later in the week when he gets to his destination.      Follow-up plan: ICM clinic phone appointment on 02/17/2022 to recheck.   91 day device clinic remote transmission 03/03/2022.     EP/Cardiology Office Visits:  Recall 07/04/2022 with Oda Kilts, PA.  Recall 05/17/2022 with Dr. Aundra Dubin.   Copy of ICM check sent to Dr. Rayann Heman.  3 month ICM trend: 02/10/2022.    12-14 Month ICM trend:     Rosalene Billings, RN 02/11/2022 5:12 PM

## 2022-02-17 ENCOUNTER — Ambulatory Visit (INDEPENDENT_AMBULATORY_CARE_PROVIDER_SITE_OTHER): Payer: Medicare HMO

## 2022-02-17 DIAGNOSIS — I5022 Chronic systolic (congestive) heart failure: Secondary | ICD-10-CM

## 2022-02-17 DIAGNOSIS — Z95810 Presence of automatic (implantable) cardiac defibrillator: Secondary | ICD-10-CM

## 2022-02-17 NOTE — Progress Notes (Signed)
EPIC Encounter for ICM Monitoring  Patient Name: [name] is a 71 y.o. male Date: 02/17/2022 Primary Care Physican: Seward Carol, MD Primary Cardiologist: Aundra Dubin Electrophysiologist: Allred Bi-V Pacing: >99%        12/02/2021 Office Weight: 267 lbs 12/10/2021 Office Weight: 276 lbs 12/17/2021 Weight:  Has not weighed since last OV                                                          AT/AF Burden: 41% (taking Xarelto)    Spoke with patient and heart failure questions reviewed.  Pt asymptomatic for fluid accumulation.  He is on vacation but being conscience what he eats.     CorVue thoracic impedance suggesting fluid levels returned to normal after taking extra Torsemide.  Prescribed: Torsemide 20 mg take 1 tablet (20 mg total) daily.   Klor Con 20 mEq take take 1 tablet daily  Spironolactone 25 mg take 1 tablet daily   Labs: 01/17/2022 Creatinine 1.40, BUN 34, Potassium 4.0, Sodium 138, GFR 54 12/10/2021 Creatinine 1.33, BUN 29, Potassium 4.3, Sodium 139, GFR 58 A complete set of results can be found in Results Review.   Recommendations:  No changes and encouraged to call if experiencing any fluid symptoms.   Follow-up plan: ICM clinic phone appointment on 03/18/2022.   91 day device clinic remote transmission 03/03/2022.     EP/Cardiology Office Visits:  Recall 07/04/2022 with Oda Kilts, PA.  Recall 05/17/2022 with Dr. Aundra Dubin.   Copy of ICM check sent to Dr. Rayann Heman.  3 month ICM trend: 02/17/2022.    12-14 Month ICM trend:     Rosalene Billings, RN 02/17/2022 3:23 PM

## 2022-02-26 DIAGNOSIS — G4733 Obstructive sleep apnea (adult) (pediatric): Secondary | ICD-10-CM | POA: Diagnosis not present

## 2022-03-05 DIAGNOSIS — G4733 Obstructive sleep apnea (adult) (pediatric): Secondary | ICD-10-CM | POA: Diagnosis not present

## 2022-03-18 ENCOUNTER — Ambulatory Visit (INDEPENDENT_AMBULATORY_CARE_PROVIDER_SITE_OTHER): Payer: Medicare HMO

## 2022-03-18 DIAGNOSIS — Z95810 Presence of automatic (implantable) cardiac defibrillator: Secondary | ICD-10-CM | POA: Diagnosis not present

## 2022-03-18 DIAGNOSIS — I5022 Chronic systolic (congestive) heart failure: Secondary | ICD-10-CM

## 2022-03-18 DIAGNOSIS — I442 Atrioventricular block, complete: Secondary | ICD-10-CM

## 2022-03-20 NOTE — Progress Notes (Signed)
EPIC Encounter for ICM Monitoring  Patient Name: [name] is a 71 y.o. male Date: 03/20/2022 Primary Care Physican: Seward Carol, MD Primary Cardiologist: Aundra Dubin Electrophysiologist: Marisa Sprinkles Pacing: >99%        12/02/2021 Office Weight: 267 lbs 12/10/2021 Office Weight: 276 lbs 12/17/2021 Weight:  Has not weighed since last OV                                                          AT/AF Burden: 49% (taking Xarelto)    Spoke with patient and heart failure questions reviewed.  Pt asymptomatic for fluid accumulation.   He did miss a couple of doses of Torsemide but will usually double up the next day.     CorVue thoracic impedance suggesting fluctuation in fluid levels ranging from dryness to fluid accumulation.   Prescribed: Torsemide 20 mg take 1 tablet (20 mg total) daily.   Klor Con 20 mEq take take 1 tablet daily  Spironolactone 25 mg take 1 tablet daily   Labs: 01/17/2022 Creatinine 1.40, BUN 34, Potassium 4.0, Sodium 138, GFR 54 12/10/2021 Creatinine 1.33, BUN 29, Potassium 4.3, Sodium 139, GFR 58 A complete set of results can be found in Results Review.   Recommendations:  No changes and encouraged to call if experiencing any fluid symptoms.   Follow-up plan: ICM clinic phone appointment on 04/28/2022.   91 day device clinic remote transmission 06/02/2022.     EP/Cardiology Office Visits:  Recall 07/04/2022 with Oda Kilts, PA.  Recall 05/17/2022 with Dr. Aundra Dubin.   Copy of ICM check sent to Dr. Quentin Ore.  3 month ICM trend: 03/18/2022.    12-14 Month ICM trend:     Rosalene Billings, RN 03/20/2022 4:21 PM

## 2022-03-25 LAB — CUP PACEART REMOTE DEVICE CHECK
Date Time Interrogation Session: 20230908150854
Implantable Lead Implant Date: 20210209
Implantable Lead Implant Date: 20210209
Implantable Lead Implant Date: 20210209
Implantable Lead Location: 753858
Implantable Lead Location: 753859
Implantable Lead Location: 753860
Implantable Pulse Generator Implant Date: 20210209
Pulse Gen Serial Number: 111006442

## 2022-03-29 DIAGNOSIS — G4733 Obstructive sleep apnea (adult) (pediatric): Secondary | ICD-10-CM | POA: Diagnosis not present

## 2022-04-03 DIAGNOSIS — M1611 Unilateral primary osteoarthritis, right hip: Secondary | ICD-10-CM | POA: Diagnosis not present

## 2022-04-03 DIAGNOSIS — I480 Paroxysmal atrial fibrillation: Secondary | ICD-10-CM | POA: Diagnosis not present

## 2022-04-03 DIAGNOSIS — I5022 Chronic systolic (congestive) heart failure: Secondary | ICD-10-CM | POA: Diagnosis not present

## 2022-04-03 DIAGNOSIS — I10 Essential (primary) hypertension: Secondary | ICD-10-CM | POA: Diagnosis not present

## 2022-04-05 DIAGNOSIS — G4733 Obstructive sleep apnea (adult) (pediatric): Secondary | ICD-10-CM | POA: Diagnosis not present

## 2022-04-09 NOTE — Progress Notes (Signed)
Remote ICD transmission.   

## 2022-04-28 ENCOUNTER — Ambulatory Visit (INDEPENDENT_AMBULATORY_CARE_PROVIDER_SITE_OTHER): Payer: Medicare HMO

## 2022-04-28 DIAGNOSIS — I5022 Chronic systolic (congestive) heart failure: Secondary | ICD-10-CM | POA: Diagnosis not present

## 2022-04-28 DIAGNOSIS — G4733 Obstructive sleep apnea (adult) (pediatric): Secondary | ICD-10-CM | POA: Diagnosis not present

## 2022-04-28 DIAGNOSIS — Z95810 Presence of automatic (implantable) cardiac defibrillator: Secondary | ICD-10-CM | POA: Diagnosis not present

## 2022-04-28 NOTE — Progress Notes (Signed)
EPIC Encounter for ICM Monitoring  Patient Name: [name] is a 71 y.o. male Date: 04/28/2022 Primary Care Physican: Seward Carol, MD Primary Cardiologist: Aundra Dubin Electrophysiologist: Marisa Sprinkles Pacing: >99%        12/02/2021 Office Weight: 267 lbs 12/10/2021 Office Weight: 276 lbs 12/17/2021 Weight:  Has not weighed since last OV                                                          AT/AF Burden: 56% (taking Xarelto)    Spoke with patient and heart failure questions reviewed.  Pt asymptomatic for fluid accumulation.   Wife had heart attack on Wednesday 10/11 and he did not he take diuretic for 2-3 days. He is back on track with following diet and taking Torsemide which correlates with impedance trend.    CorVue thoracic impedance suggesting possible fluid accumulation starting 10/12 but trending back toward baseline.   Prescribed: Torsemide 20 mg take 1 tablet (20 mg total) daily.   Klor Con 20 mEq take take 1 tablet daily  Spironolactone 25 mg take 1 tablet daily   Labs: 01/17/2022 Creatinine 1.40, BUN 34, Potassium 4.0, Sodium 138, GFR 54 12/10/2021 Creatinine 1.33, BUN 29, Potassium 4.3, Sodium 139, GFR 58 A complete set of results can be found in Results Review.   Recommendations:  No changes and encouraged to call if experiencing any fluid symptoms.   Follow-up plan: ICM clinic phone appointment on 05/05/2022 to recheck fluid levels   91 day device clinic remote transmission 06/02/2022.     EP/Cardiology Office Visits:  Recall 07/04/2022 with Oda Kilts, PA.  Recall 05/17/2022 with Dr. Aundra Dubin.   Copy of ICM check sent to Dr. Quentin Ore.  Will send copy to [name] if impedance does not return to normal.  3 month ICM trend: 04/28/2022.    12-14 Month ICM trend:     Rosalene Billings, RN 04/28/2022 4:45 PM

## 2022-05-05 ENCOUNTER — Other Ambulatory Visit (HOSPITAL_COMMUNITY): Payer: Self-pay | Admitting: Cardiology

## 2022-05-05 ENCOUNTER — Ambulatory Visit (INDEPENDENT_AMBULATORY_CARE_PROVIDER_SITE_OTHER): Payer: Medicare HMO

## 2022-05-05 DIAGNOSIS — G4733 Obstructive sleep apnea (adult) (pediatric): Secondary | ICD-10-CM | POA: Diagnosis not present

## 2022-05-05 DIAGNOSIS — I5022 Chronic systolic (congestive) heart failure: Secondary | ICD-10-CM

## 2022-05-05 DIAGNOSIS — Z95810 Presence of automatic (implantable) cardiac defibrillator: Secondary | ICD-10-CM

## 2022-05-05 NOTE — Progress Notes (Signed)
EPIC Encounter for ICM Monitoring  Patient Name: [name] is a 71 y.o. male Date: 05/05/2022 Primary Care Physican: Seward Carol, MD Primary Cardiologist: Aundra Dubin Electrophysiologist: Marisa Sprinkles Pacing: >99%        12/02/2021 Office Weight: 267 lbs 12/10/2021 Office Weight: 276 lbs 12/17/2021 Weight:  Has not weighed since last OV                                                          AT/AF Burden: 57% (taking Xarelto)    Spoke with patient and heart failure questions reviewed.  Pt asymptomatic for fluid accumulation.   He is doing well and wife is feeling fine after her heart attack.    CorVue thoracic impedance suggesting fluid levels returned to normal.   Prescribed: Torsemide 20 mg take 1 tablet (20 mg total) daily.   Klor Con 20 mEq take take 1 tablet daily  Spironolactone 25 mg take 1 tablet daily   Labs: 01/17/2022 Creatinine 1.40, BUN 34, Potassium 4.0, Sodium 138, GFR 54 12/10/2021 Creatinine 1.33, BUN 29, Potassium 4.3, Sodium 139, GFR 58 A complete set of results can be found in Results Review.   Recommendations:  No changes and encouraged to call if experiencing any fluid symptoms.   Follow-up plan: ICM clinic phone appointment on 06/02/2022   91 day device clinic remote transmission 06/17/2022.     EP/Cardiology Office Visits:  Recall 07/04/2022 with Oda Kilts, PA.  Recall 05/17/2022 with Dr. Aundra Dubin.   Copy of ICM check sent to Dr. Quentin Ore.  3 month ICM trend: 05/05/2022.    12-14 Month ICM trend:     Rosalene Billings, RN 05/05/2022 4:56 PM

## 2022-05-29 DIAGNOSIS — G4733 Obstructive sleep apnea (adult) (pediatric): Secondary | ICD-10-CM | POA: Diagnosis not present

## 2022-06-02 ENCOUNTER — Ambulatory Visit (INDEPENDENT_AMBULATORY_CARE_PROVIDER_SITE_OTHER): Payer: Medicare HMO

## 2022-06-02 DIAGNOSIS — Z95810 Presence of automatic (implantable) cardiac defibrillator: Secondary | ICD-10-CM | POA: Diagnosis not present

## 2022-06-02 DIAGNOSIS — I5022 Chronic systolic (congestive) heart failure: Secondary | ICD-10-CM | POA: Diagnosis not present

## 2022-06-03 NOTE — Progress Notes (Signed)
EPIC Encounter for ICM Monitoring  Patient Name: [name] is a 71 y.o. male Date: 06/03/2022 Primary Care Physican: Seward Carol, MD Primary Cardiologist: Aundra Dubin Electrophysiologist: Marisa Sprinkles Pacing: >99%        12/02/2021 Office Weight: 267 lbs 12/10/2021 Office Weight: 276 lbs 12/17/2021 Weight:  Has not weighed since last OV 06/02/2022 Weight: has not weighed                                                          AT/AF Burden: 61% (taking Xarelto)    Spoke with patient and heart failure questions reviewed.  Transmission results reviewed.  Pt reports swelling of feet.  He was traveling during decreased impedance and unable to take Lasix.     CorVue thoracic impedance suggesting possible fluid accumulation from 11/8-11/18 followed by slight dryness.   Prescribed: Torsemide 20 mg take 1 tablet (20 mg total) daily.   Klor Con 20 mEq take take 1 tablet daily  Spironolactone 25 mg take 1 tablet daily   Labs: 01/17/2022 Creatinine 1.40, BUN 34, Potassium 4.0, Sodium 138, GFR 54 12/10/2021 Creatinine 1.33, BUN 29, Potassium 4.3, Sodium 139, GFR 58 A complete set of results can be found in Results Review.   Recommendations:  No changes and encouraged to call if experiencing any fluid symptoms.   Follow-up plan: ICM clinic phone appointment on 07/08/2022   91 day device clinic remote transmission 06/17/2022.     EP/Cardiology Office Visits:  Recall 07/04/2022 with Oda Kilts, PA for 1 year f/u.  Recall 05/17/2022 with Dr. Aundra Dubin.   Copy of ICM check sent to Dr. Quentin Ore.   3 month ICM trend: 06/02/2022.    12-14 Month ICM trend:     Rosalene Billings, RN 06/03/2022 7:45 AM

## 2022-06-12 DIAGNOSIS — M150 Primary generalized (osteo)arthritis: Secondary | ICD-10-CM | POA: Diagnosis not present

## 2022-06-12 DIAGNOSIS — M25432 Effusion, left wrist: Secondary | ICD-10-CM | POA: Diagnosis not present

## 2022-06-17 ENCOUNTER — Ambulatory Visit (INDEPENDENT_AMBULATORY_CARE_PROVIDER_SITE_OTHER): Payer: Medicare HMO

## 2022-06-17 DIAGNOSIS — I442 Atrioventricular block, complete: Secondary | ICD-10-CM

## 2022-06-18 LAB — CUP PACEART REMOTE DEVICE CHECK
Battery Remaining Longevity: 54 mo
Battery Remaining Percentage: 59 %
Battery Voltage: 2.95 V
Brady Statistic AP VP Percent: 98 %
Brady Statistic AP VS Percent: 1 %
Brady Statistic AS VP Percent: 1 %
Brady Statistic AS VS Percent: 1 %
Brady Statistic RA Percent Paced: 37 %
Date Time Interrogation Session: 20231205020052
HighPow Impedance: 84 Ohm
Implantable Lead Connection Status: 753985
Implantable Lead Connection Status: 753985
Implantable Lead Connection Status: 753985
Implantable Lead Implant Date: 20210209
Implantable Lead Implant Date: 20210209
Implantable Lead Implant Date: 20210209
Implantable Lead Location: 753858
Implantable Lead Location: 753859
Implantable Lead Location: 753860
Implantable Pulse Generator Implant Date: 20210209
Lead Channel Impedance Value: 1025 Ohm
Lead Channel Impedance Value: 430 Ohm
Lead Channel Impedance Value: 500 Ohm
Lead Channel Pacing Threshold Amplitude: 0.75 V
Lead Channel Pacing Threshold Amplitude: 0.75 V
Lead Channel Pacing Threshold Amplitude: 2 V
Lead Channel Pacing Threshold Pulse Width: 0.5 ms
Lead Channel Pacing Threshold Pulse Width: 0.5 ms
Lead Channel Pacing Threshold Pulse Width: 0.5 ms
Lead Channel Sensing Intrinsic Amplitude: 12 mV
Lead Channel Sensing Intrinsic Amplitude: 3.1 mV
Lead Channel Setting Pacing Amplitude: 2 V
Lead Channel Setting Pacing Amplitude: 2.5 V
Lead Channel Setting Pacing Amplitude: 2.5 V
Lead Channel Setting Pacing Pulse Width: 0.5 ms
Lead Channel Setting Pacing Pulse Width: 0.5 ms
Lead Channel Setting Sensing Sensitivity: 0.5 mV
Pulse Gen Serial Number: 111006442
Zone Setting Status: 755011

## 2022-06-28 DIAGNOSIS — G4733 Obstructive sleep apnea (adult) (pediatric): Secondary | ICD-10-CM | POA: Diagnosis not present

## 2022-07-03 ENCOUNTER — Other Ambulatory Visit: Payer: Self-pay | Admitting: Cardiology

## 2022-07-08 ENCOUNTER — Ambulatory Visit (INDEPENDENT_AMBULATORY_CARE_PROVIDER_SITE_OTHER): Payer: Medicare HMO

## 2022-07-08 DIAGNOSIS — Z95810 Presence of automatic (implantable) cardiac defibrillator: Secondary | ICD-10-CM

## 2022-07-08 DIAGNOSIS — I5022 Chronic systolic (congestive) heart failure: Secondary | ICD-10-CM

## 2022-07-08 NOTE — Progress Notes (Signed)
EPIC Encounter for ICM Monitoring  Patient Name: [name] is a 71 y.o. male Date: 07/08/2022 Primary Care Physican: Seward Carol, MD Primary Cardiologist: Aundra Dubin Electrophysiologist: Marisa Sprinkles Pacing: >99%        12/02/2021 Office Weight: 267 lbs 12/10/2021 Office Weight: 276 lbs 12/17/2021 Weight:  Has not weighed since last OV 06/02/2022 Weight: has not weighed                                                          AT/AF Burden: 65% (taking Xarelto)    Spoke with patient and heart failure questions reviewed.  Transmission results reviewed.  Pt asymptomatic for fluid accumulation.  He has rheumatoid arthritis in feet and hands which is causing pain.   He thinks holiday foods may have contributed to decreased impedance.    CorVue thoracic impedance suggesting possible fluid accumulation starting 12/20 and returned to baseline 12/26.   Prescribed: Torsemide 20 mg take 1 tablet (20 mg total) daily.   Klor Con 20 mEq take take 1 tablet daily  Spironolactone 25 mg take 1 tablet daily   Labs: 01/17/2022 Creatinine 1.40, BUN 34, Potassium 4.0, Sodium 138, GFR 54 12/10/2021 Creatinine 1.33, BUN 29, Potassium 4.3, Sodium 139, GFR 58 A complete set of results can be found in Results Review.   Recommendations:  Recommendation to resume low salt diet.  Encouraged to call if experiencing any fluid symptoms.    Follow-up plan: ICM clinic phone appointment on 08/11/2022.   91 day device clinic remote transmission 09/16/2022.     EP/Cardiology Office Visits:  08/11/2022 with Dr Quentin Ore.  Advised to call Dr Oleh Genin office for overdue office appointment.  Recall 05/17/2022 with Dr. Aundra Dubin.   Copy of ICM check sent to Dr. Quentin Ore.    3 month ICM trend: 07/08/2022.    12-14 Month ICM trend:     Rosalene Billings, RN 07/08/2022 9:33 AM

## 2022-07-16 NOTE — Progress Notes (Signed)
Remote ICD transmission.   

## 2022-07-17 DIAGNOSIS — M25562 Pain in left knee: Secondary | ICD-10-CM | POA: Insufficient documentation

## 2022-07-18 DIAGNOSIS — M25562 Pain in left knee: Secondary | ICD-10-CM | POA: Diagnosis not present

## 2022-07-18 DIAGNOSIS — Z96641 Presence of right artificial hip joint: Secondary | ICD-10-CM | POA: Diagnosis not present

## 2022-07-18 DIAGNOSIS — M1712 Unilateral primary osteoarthritis, left knee: Secondary | ICD-10-CM | POA: Insufficient documentation

## 2022-07-29 DIAGNOSIS — G4733 Obstructive sleep apnea (adult) (pediatric): Secondary | ICD-10-CM | POA: Diagnosis not present

## 2022-07-30 DIAGNOSIS — M79672 Pain in left foot: Secondary | ICD-10-CM | POA: Diagnosis not present

## 2022-07-30 DIAGNOSIS — M79671 Pain in right foot: Secondary | ICD-10-CM | POA: Diagnosis not present

## 2022-08-08 ENCOUNTER — Ambulatory Visit: Payer: Medicare HMO | Attending: Cardiology

## 2022-08-08 DIAGNOSIS — Z95810 Presence of automatic (implantable) cardiac defibrillator: Secondary | ICD-10-CM

## 2022-08-08 DIAGNOSIS — I5022 Chronic systolic (congestive) heart failure: Secondary | ICD-10-CM

## 2022-08-08 NOTE — Progress Notes (Signed)
EPIC Encounter for ICM Monitoring  Patient Name: [name] is a 72 y.o. male Date: 08/08/2022 Primary Care Physican: Seward Carol, MD Primary Cardiologist: Aundra Dubin Electrophysiologist: Marisa Sprinkles Pacing: >99%        12/02/2021 Office Weight: 267 lbs 12/10/2021 Office Weight: 276 lbs 12/17/2021 Weight:  Has not weighed since last OV 06/02/2022 Weight: has not weighed                                                          AT/AF Burden: 68% (taking Xarelto)    Spoke with patient and heart failure questions reviewed.  Transmission results reviewed.  Pt asymptomatic for fluid accumulation.    CorVue thoracic impedance suggesting normal fluid levels since the holidays.   Prescribed: Torsemide 20 mg take 1 tablet (20 mg total) daily.   Klor Con 20 mEq take take 1 tablet daily  Spironolactone 25 mg take 1 tablet daily   Labs: 01/17/2022 Creatinine 1.40, BUN 34, Potassium 4.0, Sodium 138, GFR 54 12/10/2021 Creatinine 1.33, BUN 29, Potassium 4.3, Sodium 139, GFR 58 A complete set of results can be found in Results Review.   Recommendations:   Encouraged to call if experiencing any fluid symptoms.    Follow-up plan: ICM clinic phone appointment on 09/08/2022.   91 day device clinic remote transmission 09/16/2022.     EP/Cardiology Office Visits:  08/11/2022 with Dr Quentin Ore.  Advised to call Dr Oleh Genin office for overdue office appointment.  Recall 05/17/2022 with Dr. Aundra Dubin.   Copy of ICM check sent to Dr. Quentin Ore.    3 month ICM trend: 08/08/2022.    12-14 Month ICM trend:     Rosalene Billings, RN 08/08/2022 4:14 PM

## 2022-08-11 ENCOUNTER — Encounter: Payer: Self-pay | Admitting: Cardiology

## 2022-08-11 ENCOUNTER — Ambulatory Visit: Payer: Medicare HMO | Attending: Cardiology | Admitting: Cardiology

## 2022-08-11 VITALS — BP 112/60 | HR 60

## 2022-08-11 DIAGNOSIS — I4819 Other persistent atrial fibrillation: Secondary | ICD-10-CM | POA: Diagnosis not present

## 2022-08-11 DIAGNOSIS — I5022 Chronic systolic (congestive) heart failure: Secondary | ICD-10-CM | POA: Diagnosis not present

## 2022-08-11 DIAGNOSIS — Z95810 Presence of automatic (implantable) cardiac defibrillator: Secondary | ICD-10-CM

## 2022-08-11 LAB — CUP PACEART REMOTE DEVICE CHECK
Battery Remaining Longevity: 52 mo
Battery Remaining Percentage: 57 %
Battery Voltage: 2.95 V
Brady Statistic AP VP Percent: 98 %
Brady Statistic AP VS Percent: 1 %
Brady Statistic AS VP Percent: 1 %
Brady Statistic AS VS Percent: 1 %
Brady Statistic RA Percent Paced: 32 %
Date Time Interrogation Session: 20240126021731
HighPow Impedance: 75 Ohm
Implantable Lead Connection Status: 753985
Implantable Lead Connection Status: 753985
Implantable Lead Connection Status: 753985
Implantable Lead Implant Date: 20210209
Implantable Lead Implant Date: 20210209
Implantable Lead Implant Date: 20210209
Implantable Lead Location: 753858
Implantable Lead Location: 753859
Implantable Lead Location: 753860
Implantable Pulse Generator Implant Date: 20210209
Lead Channel Impedance Value: 390 Ohm
Lead Channel Impedance Value: 480 Ohm
Lead Channel Impedance Value: 880 Ohm
Lead Channel Pacing Threshold Amplitude: 0.75 V
Lead Channel Pacing Threshold Amplitude: 0.75 V
Lead Channel Pacing Threshold Amplitude: 2 V
Lead Channel Pacing Threshold Pulse Width: 0.5 ms
Lead Channel Pacing Threshold Pulse Width: 0.5 ms
Lead Channel Pacing Threshold Pulse Width: 0.5 ms
Lead Channel Sensing Intrinsic Amplitude: 12 mV
Lead Channel Sensing Intrinsic Amplitude: 3.1 mV
Lead Channel Setting Pacing Amplitude: 2 V
Lead Channel Setting Pacing Amplitude: 2.5 V
Lead Channel Setting Pacing Amplitude: 2.5 V
Lead Channel Setting Pacing Pulse Width: 0.5 ms
Lead Channel Setting Pacing Pulse Width: 0.5 ms
Lead Channel Setting Sensing Sensitivity: 0.5 mV
Pulse Gen Serial Number: 111006442
Zone Setting Status: 755011

## 2022-08-11 NOTE — Progress Notes (Deleted)
Electrophysiology Office Follow up Visit Note:    Date:  08/11/2022   ID:  [name], DOB 04/07/1951, MRN [id number]  PCP:  Seward Carol, MD  [facility] Cardiologist:  Quay Burow, MD  [facility] Electrophysiologist:  Vickie Epley, MD    Interval History:    [name] is a 72 y.o. male who presents for a follow up visit.  He was previously seen by Dr. Rayann Heman.  He follows with Dr. Aundra Dubin in the heart failure clinic.  History of chronic atrial fibrillation post failed Maze procedure, post mitral and aortic valve repairs.  Previously on Tikosyn.  Left atrial appendage ligated.  Had a CRT-D implanted in February 2021.  AV node ablation followed.       Past Medical History:  Diagnosis Date   AICD (automatic cardioverter/defibrillator) present    Aortic insufficiency 04/21/2018   Aortic insufficiency    Arthritis    "joints" (09/26/2013)   Atrial enlargement, left    CHF (congestive heart failure) ([place])    Essential hypertension 11/28/2020   Hearing aid worn    both ears   Mitral regurgitation 04/21/2018   Obesity    OSA on CPAP    "mask adjusts to what I need" (09/26/2013)   Persistent atrial fibrillation (HCC)    cardioversion 06/06/13   PONV (postoperative nausea and vomiting)    Rotator cuff tear, right dec 2012   physical therapy done, decreased strength   S/P aortic valve repair 05/18/2018   Complex valvuloplasty including plication of right coronary leaflet and 21 mm Biostable HAART annuloplasty ring   S/P Maze operation for atrial fibrillation 05/18/2018   Complete bilateral atrial lesion set using bipolar radiofrequency and cryothermy ablation with clipping of LA appendage   S/P mitral valve repair 05/18/2018   Complex valvuloplasty including artificial Gore-tex neochord placement x4 and 30 mm Sorin Memo 4D ring annuloplasty   Wears glasses     Past Surgical History:  Procedure Laterality Date   AORTIC VALVE REPAIR N/A 05/18/2018    Procedure: AORTIC VALVE REPAIR using HAART 300 Aortic Annuloplasty Device size 76m;  Surgeon: ORexene Alberts MD;  [place]  Service: Open Heart Surgery;  Laterality: N/A;   ATRIAL FIBRILLATION ABLATION N/A 04/26/2019   Procedure: ATRIAL FIBRILLATION ABLATION;  Surgeon: AThompson Grayer MD;  [place];  Service: Cardiovascular;  Laterality: N/A;   AV NODE ABLATION N/A 12/20/2019   Procedure: AV NODE ABLATION;  Surgeon: AThompson Grayer MD;  [place] [facility];  Service: Cardiovascular;  Laterality: N/A;   BIV ICD INSERTION CRT-D N/A 08/23/2019   Procedure: BIV ICD INSERTION CRT-D;  Surgeon: AThompson Grayer MD;  [facility];  Service: Cardiovascular;  Laterality: N/A;   CARDIOVERSION N/A 06/06/2013   Procedure: CARDIOVERSION;  Surgeon: MSanda Klein MD;  [place] [facility]  Service: Cardiovascular;  Laterality: N/A;   CARDIOVERSION N/A 09/28/2013   Procedure: CARDIOVERSION BEDSIDE;  Surgeon: Peter M JMartinique MD;  [place]  Service: Cardiovascular;  Laterality: N/A;   CARDIOVERSION N/A 11/30/2018   Procedure: CARDIOVERSION;  Surgeon: NDorothy Spark MD;  [facility]  Service: Cardiovascular;  Laterality: N/A;   CARDIOVERSION N/A 03/10/2019   Procedure: CARDIOVERSION;  Surgeon: SDonato Heinz MD;  [place] [facility]  Service: Endoscopy;  Laterality: N/A;   CARDIOVERSION N/A 12/20/2021   Procedure: CARDIOVERSION;  Surgeon: MLarey Dresser MD;  [place]  Service: Cardiovascular;  Laterality: N/A;  CARPAL TUNNEL RELEASE Right 08/08/2013   Procedure: RIGHT CARPAL TUNNEL RELEASE;  Surgeon: Wynonia Sours, MD;  Location: [place];  Service: Orthopedics;  Laterality: Right;   CARPAL TUNNEL RELEASE Left 2008   CLIPPING OF ATRIAL APPENDAGE  05/18/2018   Procedure: CLIPPING OF ATRIAL APPENDAGE using AtriCure Clip size 45;  Surgeon: Rexene Alberts, MD;  Location: [place];  Service: Open Heart  Surgery;;   KNEE ARTHROSCOPY Right 1990's   MAZE N/A 05/18/2018   Procedure: MAZE;  Surgeon: Rexene Alberts, MD;  Location: [place];  Service: Open Heart Surgery;  Laterality: N/A;   MITRAL VALVE REPAIR N/A 05/18/2018   Procedure: MITRAL VALVE REPAIR (MVR) using 4D Memo Ring size 30;  Surgeon: Rexene Alberts, MD;  Location: [place];  Service: Open Heart Surgery;  Laterality: N/A;   RIGHT/LEFT HEART CATH AND CORONARY ANGIOGRAPHY N/A 04/21/2018   Procedure: RIGHT/LEFT HEART CATH AND CORONARY ANGIOGRAPHY;  Surgeon: Nelva Bush, MD;  Location: [place] [facility];  Service: Cardiovascular;  Laterality: N/A;   SHOULDER OPEN ROTATOR CUFF REPAIR Left 1990's   SHOULDER OPEN ROTATOR CUFF REPAIR Right 11/2007   TEE WITHOUT CARDIOVERSION N/A 04/21/2018   Procedure: TRANSESOPHAGEAL ECHOCARDIOGRAM (TEE);  Surgeon: Jerline Pain, MD;  [facility];  Service: Cardiovascular;  Laterality: N/A;   TEE WITHOUT CARDIOVERSION N/A 05/18/2018   Procedure: TRANSESOPHAGEAL ECHOCARDIOGRAM (TEE);  Surgeon: Rexene Alberts, MD;  Location: [place];  Service: Open Heart Surgery;  Laterality: N/A;   TONSILLECTOMY  1950's   TOTAL HIP ARTHROPLASTY Left 2010   TOTAL HIP ARTHROPLASTY Right 05/29/2020   Procedure: TOTAL HIP ARTHROPLASTY ANTERIOR APPROACH;  Surgeon: Renette Butters, MD;  [facility];  Service: Orthopedics;  Laterality: Right;   TOTAL HIP REVISION Left 10/08/2011   TOTAL HIP REVISION  04/09/2012   Procedure: TOTAL HIP REVISION;  Surgeon: Gearlean Alf, MD;  [facility];  Service: Orthopedics;  Laterality: Left;  Left Hip Acetabular Revision vs Constrained Liner   TOTAL KNEE ARTHROPLASTY Right 2006    TRIGGER FINGER RELEASE Right 08/08/2013   Procedure: RELEASE STENOSING TENOSYNOVITIS RIGHT THUMB;  Surgeon: Wynonia Sours, MD;  Location: [place];  Service: Orthopedics;  Laterality: Right;    Current Medications: No outpatient medications have been marked as taking for the  08/11/22 encounter (Appointment) with Vickie Epley, MD.     Allergies:   Metoprolol tartrate and Oxycodone   Social History   Socioeconomic History   Marital status: Married    Spouse name: Not on file   Number of children: Not on file   Years of education: Not on file   Highest education level: Not on file  Occupational History   Occupation: Agricultural consultant  Tobacco Use   Smoking status: Never   Smokeless tobacco: Never  Vaping Use   Vaping Use: Never used  Substance and Sexual Activity   Alcohol use: No   Drug use: No   Sexual activity: Yes  Other Topics Concern   Not on file  Social History Narrative   Pt lives in [place] with spouse.  Leadership training and behavioral safety training.   Social Determinants of Health   Financial Resource Strain: Not on file  Food Insecurity: Not on file  Transportation Needs: No Transportation Needs (09/27/2019)   PRAPARE - Hydrologist (Medical): No    Lack of Transportation (Non-Medical): No  Physical Activity: Not on  file  Stress: Not on file  Social Connections: Not on file     Family History: The patient's family history includes Hypertension in his father; Other in an other family member.  ROS:   Please see the history of present illness.    All other systems reviewed and are negative.  EKGs/Labs/Other Studies Reviewed:    The following studies were reviewed today:  August 11, 2022 in clinic device interrogation personally reviewed ***  EKG:  The ekg ordered today demonstrates ***  Recent Labs: 01/17/2022: BUN 34; Creatinine, Ser 1.40; Hemoglobin 15.0; Platelets 147; Potassium 4.0; Sodium 138  Recent Lipid Panel    Component Value Date/Time   CHOL 187 01/17/2022 1521   CHOL 126 11/16/2018 0000   TRIG 155 (H) 01/17/2022 1521   HDL 39 (L) 01/17/2022 1521   HDL 37 (L) 11/16/2018 0000   CHOLHDL 4.8 01/17/2022 1521   VLDL 31 01/17/2022 1521   LDLCALC  117 (H) 01/17/2022 1521   LDLCALC 74 11/16/2018 0000    Physical Exam:    VS:  There were no vitals taken for this visit.    Wt Readings from Last 3 Encounters:  01/17/22 275 lb 6.4 oz (124.9 kg)  12/20/21 274 lb (124.3 kg)  12/10/21 276 lb (125.2 kg)     GEN: *** Well nourished, well developed in no acute distress CARDIAC: ***RRR, no murmurs, rubs, gallops.  ICD pocket well-healed RESPIRATORY:  Clear to auscultation without rales, wheezing or rhonchi        ASSESSMENT:    1. Chronic systolic heart failure ([place])   2. ICD (implantable cardioverter-defibrillator) in place   3. Persistent atrial fibrillation (HCC)    PLAN:    In order of problems listed above:  #Chronic systolic heart failure NYHA class II.  Warm and dry on exam.  Continue GDMT with follow-up in Dr. Claris Gladden clinic.  #Permanent atrial fibrillation Post AV nodal ablation.  Continue Xarelto  #CRT-D in situ Device functioning appropriately.  Continue remote monitoring.     Medication Adjustments/Labs and Tests Ordered: Current medicines are reviewed at length with the patient today.  Concerns regarding medicines are outlined above.  No orders of the defined types were placed in this encounter.  No orders of the defined types were placed in this encounter.    Signed, Lars Mage, MD, [facility], [facility] 08/11/2022 5:04 AM    Electrophysiology [facility]

## 2022-08-11 NOTE — Patient Instructions (Signed)
Medication Instructions:  Your physician recommends that you continue on your current medications as directed. Please refer to the Current Medication list given to you today.  *If you need a refill on your cardiac medications before your next appointment, please call your pharmacy*  Follow-Up: At [facility], you and your health needs are our priority.  As part of our continuing mission to provide you with exceptional heart care, we have created designated Provider Care Teams.  These Care Teams include your primary Cardiologist (physician) and Advanced Practice Providers (APPs -  Physician Assistants and Nurse Practitioners) who all work together to provide you with the care you need, when you need it.  Your next appointment:   1 year(s)  Provider:   You may see CAMERON T LAMBERT, MD or one of the following Advanced Practice Providers on your designated Care Team:   Renee Ursuy, PA-C Michael "Andy" Tillery, PA-C Suzann Riddle, NP    

## 2022-08-11 NOTE — Progress Notes (Signed)
Electrophysiology Office Follow up Visit Note:    Date:  08/11/2022   ID:  [name], DOB March 26, 1951, MRN [id number]  PCP:  Seward Carol, MD  [facility] Cardiologist:  Quay Burow, MD  [facility] Electrophysiologist:  Vickie Epley, MD    Interval History:    [name] is a 72 y.o. male who presents for a follow up visit.  He was previously seen by Dr. Rayann Heman.  He follows with Dr. Aundra Dubin in the heart failure clinic.  History of chronic atrial fibrillation post failed Maze procedure, post mitral and aortic valve repairs.  Previously on Tikosyn.  Left atrial appendage ligated.  Had a CRT-D implanted in February 2021.  AV node ablation followed.  Today, he is accompanied by his mother. He reports that he has good and bad days. I discussed that uses his PPM 99% of heartbeats.  He notes that Gabapentin improves his nerves. He reports that arthritis has limited his activity. His left UE is consistently swollen. He has slight swelling in his right LE. He attributes this to recently injuring his ankle.   He complains that his diuretics has caused him urinary frequency every 10-20 minutes. He notes that he could not take this medication while traveling.  He denies any chest pain, shortness of breath, or peripheral edema. No lightheadedness, headaches, syncope, orthopnea, or PND.     Past Medical History:  Diagnosis Date   AICD (automatic cardioverter/defibrillator) present    Aortic insufficiency 04/21/2018   Aortic insufficiency    Arthritis    "joints" (09/26/2013)   Atrial enlargement, left    CHF (congestive heart failure) ([place])    Essential hypertension 11/28/2020   Hearing aid worn    both ears   Mitral regurgitation 04/21/2018   Obesity    OSA on CPAP    "mask adjusts to what I need" (09/26/2013)   Persistent atrial fibrillation (HCC)    cardioversion 06/06/13   PONV (postoperative nausea and vomiting)    Rotator cuff tear, right dec 2012    physical therapy done, decreased strength   S/P aortic valve repair 05/18/2018   Complex valvuloplasty including plication of right coronary leaflet and 21 mm Biostable HAART annuloplasty ring   S/P Maze operation for atrial fibrillation 05/18/2018   Complete bilateral atrial lesion set using bipolar radiofrequency and cryothermy ablation with clipping of LA appendage   S/P mitral valve repair 05/18/2018   Complex valvuloplasty including artificial Gore-tex neochord placement x4 and 30 mm Sorin Memo 4D ring annuloplasty   Wears glasses     Past Surgical History:  Procedure Laterality Date   AORTIC VALVE REPAIR N/A 05/18/2018   Procedure: AORTIC VALVE REPAIR using HAART 300 Aortic Annuloplasty Device size 65m;  Surgeon: ORexene Alberts MD;  [place] [facility] [place]  Service: Open Heart Surgery;  Laterality: N/A;   ATRIAL FIBRILLATION ABLATION N/A 04/26/2019   Procedure: ATRIAL FIBRILLATION ABLATION;  Surgeon: AThompson Grayer MD;  [facility];  Service: Cardiovascular;  Laterality: N/A;   AV NODE ABLATION N/A 12/20/2019   Procedure: AV NODE ABLATION;  Surgeon: AThompson Grayer MD;  [facility];  Service: Cardiovascular;  Laterality: N/A;   BIV ICD INSERTION CRT-D N/A 08/23/2019   Procedure: BIV ICD INSERTION CRT-D;  Surgeon: AThompson Grayer MD;  [place] [facility];  Service: Cardiovascular;  Laterality: N/A;   CARDIOVERSION N/A 06/06/2013   Procedure: CARDIOVERSION;  Surgeon: MSanda Klein MD;  [facility];  Service:  Cardiovascular;  Laterality: N/A;   CARDIOVERSION N/A 09/28/2013   Procedure: CARDIOVERSION BEDSIDE;  Surgeon: Peter M Martinique, MD;  Location: [place];  Service: Cardiovascular;  Laterality: N/A;   CARDIOVERSION N/A 11/30/2018   Procedure: CARDIOVERSION;  Surgeon: Dorothy Spark, MD;  Location: [facility] ENDOSCOPY;  Service: Cardiovascular;  Laterality: N/A;   CARDIOVERSION N/A 03/10/2019   Procedure: CARDIOVERSION;  Surgeon: [name], MD;  Location: [place];  Service: Endoscopy;  Laterality: N/A;   CARDIOVERSION N/A 12/20/2021   Procedure: CARDIOVERSION;  Surgeon: Larey Dresser, MD;  Location: [facility] ENDOSCOPY;  Service: Cardiovascular;  Laterality: N/A;   CARPAL TUNNEL RELEASE Right 08/08/2013   Procedure: RIGHT CARPAL TUNNEL RELEASE;  Surgeon: Wynonia Sours, MD;  Location: [place];  Service: Orthopedics;  Laterality: Right;   CARPAL TUNNEL RELEASE Left 2008   CLIPPING OF ATRIAL APPENDAGE  05/18/2018   Procedure: CLIPPING OF ATRIAL APPENDAGE using AtriCure Clip size 45;  Surgeon: Rexene Alberts, MD;  Location: [place];  Service: Open Heart Surgery;;   KNEE ARTHROSCOPY Right 1990's   MAZE N/A 05/18/2018   Procedure: MAZE;  Surgeon: Rexene Alberts, MD;  Location: [place];  Service: Open Heart Surgery;  Laterality: N/A;   MITRAL VALVE REPAIR N/A 05/18/2018   Procedure: MITRAL VALVE REPAIR (MVR) using 4D Memo Ring size 30;  Surgeon: Rexene Alberts, MD;  Location: [place];  Service: Open Heart Surgery;  Laterality: N/A;   RIGHT/LEFT HEART CATH AND CORONARY ANGIOGRAPHY N/A 04/21/2018   Procedure: RIGHT/LEFT HEART CATH AND CORONARY ANGIOGRAPHY;  Surgeon: Nelva Bush, MD;  Location: [place] [facility];  Service: Cardiovascular;  Laterality: N/A;   SHOULDER OPEN ROTATOR CUFF REPAIR Left 1990's   SHOULDER OPEN ROTATOR CUFF REPAIR Right 11/2007   TEE WITHOUT CARDIOVERSION N/A 04/21/2018   Procedure: TRANSESOPHAGEAL ECHOCARDIOGRAM (TEE);  Surgeon: Jerline Pain, MD;  Location: [facility] ENDOSCOPY;  Service: Cardiovascular;  Laterality: N/A;   TEE WITHOUT CARDIOVERSION N/A 05/18/2018   Procedure: TRANSESOPHAGEAL ECHOCARDIOGRAM (TEE);  Surgeon: Rexene Alberts, MD;  Location: [place];  Service: Open Heart Surgery;  Laterality: N/A;   TONSILLECTOMY  1950's   TOTAL HIP ARTHROPLASTY Left 2010   TOTAL HIP ARTHROPLASTY Right 05/29/2020   Procedure: TOTAL HIP ARTHROPLASTY ANTERIOR APPROACH;  Surgeon: Renette Butters, MD;   Location: [facility];  Service: Orthopedics;  Laterality: Right;   TOTAL HIP REVISION Left 10/08/2011   TOTAL HIP REVISION  04/09/2012   Procedure: TOTAL HIP REVISION;  Surgeon: Gearlean Alf, MD;  Location: [facility];  Service: Orthopedics;  Laterality: Left;  Left Hip Acetabular Revision vs Constrained Liner   TOTAL KNEE ARTHROPLASTY Right 2006    TRIGGER FINGER RELEASE Right 08/08/2013   Procedure: RELEASE STENOSING TENOSYNOVITIS RIGHT THUMB;  Surgeon: Wynonia Sours, MD;  Location: [place];  Service: Orthopedics;  Laterality: Right;    Current Medications: Current Meds  Medication Sig   acetaminophen (TYLENOL) 325 MG tablet Take 2 tablets (650 mg total) by mouth every 4 (four) hours as needed for headache or mild pain.   Ascorbic Acid (VITAMIN C) 1000 MG tablet Take 1,000 mg by mouth once a week.   carvedilol (COREG) 6.25 MG tablet Take 1 tablet (6.25 mg total) by mouth 2 (two) times daily with a meal.   clobetasol ointment (TEMOVATE) 1.69 % Apply 1 application topically daily as needed (rash).   diclofenac Sodium (VOLTAREN) 1 % GEL Apply 2 g topically daily as needed (pain).  ENTRESTO 24-26 MG TAKE 1 TABLET BY MOUTH TWICE A DAY   HYDROcodone-acetaminophen (NORCO/VICODIN) 5-325 MG tablet Take 1 tablet by mouth every 8 (eight) hours as needed for moderate pain.   MAGNESIUM PO Take 1 tablet by mouth 3 (three) times a week.   Multiple Vitamin (MULTIVITAMIN WITH MINERALS) TABS tablet Take 1 tablet by mouth every 7 (seven) days.   NON FORMULARY Pt uses a cpap nightly   potassium chloride SA (KLOR-CON M) 20 MEQ tablet Take 1 tablet (20 mEq total) by mouth daily.   Probiotic Product (PROBIOTIC PO) Take 1 capsule by mouth once a week.   spironolactone (ALDACTONE) 25 MG tablet TAKE 1 TABLET BY MOUTH EVERY DAY   tadalafil (CIALIS) 10 MG tablet Take 10 mg by mouth daily as needed for erectile dysfunction.   torsemide (DEMADEX) 20 MG tablet Take 1 tablet (20 mg total) by mouth daily.    XARELTO 20 MG TABS tablet TAKE ONE TABLET BY MOUTH DAILY WITH SUPPER     Allergies:   Metoprolol tartrate and Oxycodone   Social History   Socioeconomic History   Marital status: Married    Spouse name: Not on file   Number of children: Not on file   Years of education: Not on file   Highest education level: Not on file  Occupational History   Occupation: Agricultural consultant  Tobacco Use   Smoking status: Never   Smokeless tobacco: Never  Vaping Use   Vaping Use: Never used  Substance and Sexual Activity   Alcohol use: No   Drug use: No   Sexual activity: Yes  Other Topics Concern   Not on file  Social History Narrative   Pt lives in [place] with spouse.  Leadership training and behavioral safety training.   Social Determinants of Health   Financial Resource Strain: Not on file  Food Insecurity: Not on file  Transportation Needs: No Transportation Needs (09/27/2019)   PRAPARE - Hydrologist (Medical): No    Lack of Transportation (Non-Medical): No  Physical Activity: Not on file  Stress: Not on file  Social Connections: Not on file     Family History: The patient's family history includes Hypertension in his father; Other in an other family member.  ROS:   Please see the history of present illness.    (+)LUE edema (+)urinary frequency (+) slight right LE edema All other systems reviewed and are negative.  EKGs/Labs/Other Studies Reviewed:    The following studies were reviewed today:  August 11, 2022 in clinic device interrogation personally reviewed Longevity 4.3 years Lead parameter stable BiV pacing greater than 99% No high-voltage therapies delivered Atrial pacing 31% No changes made today  EKG:  EKG is personally reviewed. 08/11/22: Atrial fibrillation with BiV pacing  Recent Labs: 01/17/2022: BUN 34; Creatinine, Ser 1.40; Hemoglobin 15.0; Platelets 147; Potassium 4.0; Sodium 138  Recent Lipid  Panel    Component Value Date/Time   CHOL 187 01/17/2022 1521   CHOL 126 11/16/2018 0000   TRIG 155 (H) 01/17/2022 1521   HDL 39 (L) 01/17/2022 1521   HDL 37 (L) 11/16/2018 0000   CHOLHDL 4.8 01/17/2022 1521   VLDL 31 01/17/2022 1521   LDLCALC 117 (H) 01/17/2022 1521   LDLCALC 74 11/16/2018 0000    Physical Exam:    VS:  BP 112/60   Pulse 60   SpO2 93%     Wt Readings from Last 3 Encounters:  01/17/22 275  lb 6.4 oz (124.9 kg)  12/20/21 274 lb (124.3 kg)  12/10/21 276 lb (125.2 kg)     GEN:  Well nourished, well developed in no acute distress.  Obese CARDIAC: RRR, no murmurs, rubs, gallops.  ICD pocket well-healed RESPIRATORY:  Clear to auscultation without rales, wheezing or rhonchi        ASSESSMENT:    1. Chronic systolic heart failure ([place])   2. ICD (implantable cardioverter-defibrillator) in place   3. Persistent atrial fibrillation (HCC)    PLAN:    In order of problems listed above:  #Chronic systolic heart failure NYHA class II.  Warm and dry on exam.  Continue GDMT with follow-up in Dr. Claris Gladden clinic.  #Permanent atrial fibrillation Post AV nodal ablation.  Continue Xarelto  #CRT-D in situ Device functioning appropriately.  Continue remote monitoring.   Follow-up: 1 year  Medication Adjustments/Labs and Tests Ordered: Current medicines are reviewed at length with the patient today.  Concerns regarding medicines are outlined above.  Orders Placed This Encounter  Procedures   EKG 12-Lead   No orders of the defined types were placed in this encounter.   I,Mitra Faeizi,acting as a Education administrator for Vickie Epley, MD.,have documented all relevant documentation on the behalf of Vickie Epley, MD,as directed by  Vickie Epley, MD while in the presence of Vickie Epley, MD.  I, Vickie Epley, MD, have reviewed all documentation for this visit. The documentation on 08/11/22 for the exam, diagnosis, procedures, and orders are all  accurate and complete.   Signed, Lars Mage, MD, [facility], [facility] 08/11/2022 3:25 PM    Electrophysiology [facility]

## 2022-08-21 ENCOUNTER — Encounter (HOSPITAL_COMMUNITY): Payer: Self-pay | Admitting: *Deleted

## 2022-08-29 ENCOUNTER — Other Ambulatory Visit: Payer: Self-pay | Admitting: Cardiology

## 2022-08-29 DIAGNOSIS — I5022 Chronic systolic (congestive) heart failure: Secondary | ICD-10-CM

## 2022-08-29 DIAGNOSIS — G4733 Obstructive sleep apnea (adult) (pediatric): Secondary | ICD-10-CM | POA: Diagnosis not present

## 2022-08-29 DIAGNOSIS — I484 Atypical atrial flutter: Secondary | ICD-10-CM

## 2022-09-01 ENCOUNTER — Other Ambulatory Visit (HOSPITAL_COMMUNITY): Payer: Self-pay | Admitting: Cardiology

## 2022-09-08 ENCOUNTER — Ambulatory Visit: Payer: Medicare HMO | Attending: Cardiology

## 2022-09-08 DIAGNOSIS — Z95810 Presence of automatic (implantable) cardiac defibrillator: Secondary | ICD-10-CM

## 2022-09-08 DIAGNOSIS — I5022 Chronic systolic (congestive) heart failure: Secondary | ICD-10-CM

## 2022-09-10 NOTE — Progress Notes (Signed)
EPIC Encounter for ICM Monitoring  Patient Name: [name] is a 72 y.o. male Date: 09/10/2022 Primary Care Physican: Seward Carol, MD Primary Cardiologist: Aundra Dubin Electrophysiologist: Marisa Sprinkles Pacing: >99%        12/02/2021 Office Weight: 267 lbs 12/10/2021 Office Weight: 276 lbs 12/17/2021 Weight:  Has not weighed since last OV 06/02/2022 Weight: has not weighed                                                          AT/AF Burden: 100% (taking Xarelto)    Spoke with patient and heart failure questions reviewed.  Transmission results reviewed.  Pt on vacation 2/19-2/24 and legs were very swollen when he returned.  He took extra Torsemide and resolved swelling.   CorVue thoracic impedance suggesting possible fluid accumulation from 1/24-2/1, 2/10-2/13 and 2/19-2/24.   Prescribed: Torsemide 20 mg take 1 tablet (20 mg total) daily.   Klor Con 20 mEq take take 1 tablet daily  Spironolactone 25 mg take 1 tablet daily   Labs: 01/17/2022 Creatinine 1.40, BUN 34, Potassium 4.0, Sodium 138, GFR 54 12/10/2021 Creatinine 1.33, BUN 29, Potassium 4.3, Sodium 139, GFR 58 A complete set of results can be found in Results Review.   Recommendations:  Discussed limiting salt.  Encouraged to call if experiencing any fluid symptoms.    Follow-up plan: ICM clinic phone appointment on 10/13/2022.   91 day device clinic remote transmission 09/16/2022.     EP/Cardiology Office Visits:  Recall 08/06/2023 with Dr Quentin Ore.  09/12/2022 with [name].     Copy of ICM check sent to Dr. Quentin Ore.    3 month ICM trend: 09/08/2022.    12-14 Month ICM trend:     Rosalene Billings, RN 09/10/2022 4:52 PM

## 2022-09-12 ENCOUNTER — Encounter (HOSPITAL_COMMUNITY): Payer: Self-pay | Admitting: Cardiology

## 2022-09-12 ENCOUNTER — Telehealth: Payer: Self-pay | Admitting: Pharmacist

## 2022-09-12 ENCOUNTER — Telehealth (HOSPITAL_COMMUNITY): Payer: Self-pay

## 2022-09-12 ENCOUNTER — Ambulatory Visit (HOSPITAL_COMMUNITY)
Admission: RE | Admit: 2022-09-12 | Discharge: 2022-09-12 | Disposition: A | Discharge status: Home / Self Care | Payer: Medicare HMO | Source: Ambulatory Visit | Attending: Cardiology | Admitting: Cardiology

## 2022-09-12 VITALS — BP 128/74 | HR 64 | Wt 285.0 lb

## 2022-09-12 DIAGNOSIS — I5022 Chronic systolic (congestive) heart failure: Secondary | ICD-10-CM

## 2022-09-12 DIAGNOSIS — M2550 Pain in unspecified joint: Secondary | ICD-10-CM | POA: Diagnosis not present

## 2022-09-12 DIAGNOSIS — Z79899 Other long term (current) drug therapy: Secondary | ICD-10-CM | POA: Diagnosis not present

## 2022-09-12 DIAGNOSIS — G4733 Obstructive sleep apnea (adult) (pediatric): Secondary | ICD-10-CM | POA: Insufficient documentation

## 2022-09-12 DIAGNOSIS — I110 Hypertensive heart disease with heart failure: Secondary | ICD-10-CM | POA: Insufficient documentation

## 2022-09-12 DIAGNOSIS — Z8249 Family history of ischemic heart disease and other diseases of the circulatory system: Secondary | ICD-10-CM | POA: Insufficient documentation

## 2022-09-12 DIAGNOSIS — I482 Chronic atrial fibrillation, unspecified: Secondary | ICD-10-CM | POA: Diagnosis not present

## 2022-09-12 DIAGNOSIS — I4892 Unspecified atrial flutter: Secondary | ICD-10-CM | POA: Diagnosis not present

## 2022-09-12 DIAGNOSIS — Z6841 Body Mass Index (BMI) 40.0 and over, adult: Secondary | ICD-10-CM | POA: Insufficient documentation

## 2022-09-12 DIAGNOSIS — Z7901 Long term (current) use of anticoagulants: Secondary | ICD-10-CM | POA: Insufficient documentation

## 2022-09-12 DIAGNOSIS — E669 Obesity, unspecified: Secondary | ICD-10-CM | POA: Diagnosis not present

## 2022-09-12 DIAGNOSIS — I5032 Chronic diastolic (congestive) heart failure: Secondary | ICD-10-CM | POA: Diagnosis not present

## 2022-09-12 DIAGNOSIS — I428 Other cardiomyopathies: Secondary | ICD-10-CM | POA: Insufficient documentation

## 2022-09-12 LAB — BASIC METABOLIC PANEL
Anion gap: 8 (ref 5–15)
BUN: 28 mg/dL — ABNORMAL HIGH (ref 8–23)
CO2: 26 mmol/L (ref 22–32)
Calcium: 9.5 mg/dL (ref 8.9–10.3)
Chloride: 104 mmol/L (ref 98–111)
Creatinine, Ser: 1.19 mg/dL (ref 0.61–1.24)
GFR, Estimated: >60 mL/min (ref >60)
Glucose, Bld: 109 mg/dL — ABNORMAL HIGH (ref 70–99)
Potassium: 4.3 mmol/L (ref 3.5–5.1)
Sodium: 138 mmol/L (ref 135–145)

## 2022-09-12 LAB — LIPID PANEL
Cholesterol: 170 mg/dL (ref 0–200)
HDL: 42 mg/dL (ref >40)
LDL Cholesterol: 105 mg/dL — ABNORMAL HIGH (ref 0–99)
Total CHOL/HDL Ratio: 4 RATIO
Triglycerides: 117 mg/dL (ref <150)
VLDL: 23 mg/dL (ref 0–40)

## 2022-09-12 LAB — CBC
HCT: 44.9 % (ref 39.0–52.0)
Hemoglobin: 14.6 g/dL (ref 13.0–17.0)
MCH: 28.7 pg (ref 26.0–34.0)
MCHC: 32.5 g/dL (ref 30.0–36.0)
MCV: 88.4 fL (ref 80.0–100.0)
Platelets: 150 10*3/uL (ref 150–400)
RBC: 5.08 MIL/uL (ref 4.22–5.81)
RDW: 13.7 % (ref 11.5–15.5)
WBC: 7.3 10*3/uL (ref 4.0–10.5)
nRBC: 0 % (ref 0.0–0.2)

## 2022-09-12 MED ORDER — DAPAGLIFLOZIN PROPANEDIOL 10 MG PO TABS: 10.0000 mg | ORAL_TABLET | Freq: Every day | ORAL | 3 refills | Status: DC

## 2022-09-12 NOTE — Telephone Encounter (Signed)
Spoke with patient. [name] Rx Sent to pharmacy and labs ordered

## 2022-09-12 NOTE — Telephone Encounter (Signed)
Pt has Medicare Part D insurance which does not cover weight loss medications. They would cover GLP approved for DM in diabetic patients, however pt does not have a diagnosis of diabetes. Most recent A1c on file was 5.6% in 2019. Spoke with pt who is aware, he was appreciative for the call.

## 2022-09-12 NOTE — Telephone Encounter (Signed)
-----   Message from Jerl Mina, RN sent at 09/12/2022 10:46 AM EST ----- Semaglutide or Darcel Bayley if he qualifies

## 2022-09-12 NOTE — Progress Notes (Signed)
Medication Samples have been provided to the patient.  Drug name: Xarelto        Strength: 20 mg        Qty: 4  LOT: [id number]  Exp.Date: 04/2023  Dosing instructions: Take 1 tablet daily   The patient has been instructed regarding the correct time, dose, and frequency of taking this medication, including desired effects and most common side effects.   [name] [name] 11:08 AM 09/12/2022

## 2022-09-12 NOTE — Addendum Note (Signed)
Addended by: Jerl Mina on: 09/12/2022 03:28 PM   Modules accepted: Orders

## 2022-09-12 NOTE — Patient Instructions (Signed)
There has been no changes to your medications.  Labs done today, your results will be available in MyChart, we will contact you for abnormal readings.  Your physician has requested that you have an echocardiogram. Echocardiography is a painless test that uses sound waves to create images of your heart. It provides your doctor with information about the size and shape of your heart and how well your heart's chambers and valves are working. This procedure takes approximately one hour. There are no restrictions for this procedure. Please do NOT wear cologne, perfume, aftershave, or lotions (deodorant is allowed). Please arrive 15 minutes prior to your appointment time.  You have been referred to the [facility] for [facility]. They will call you to arrange your appointment.  Your physician recommends that you schedule a follow-up appointment in: 4 month with an echocardiogram (July) ** please call the office in May to arrange your follow up appointment. **  If you have any questions or concerns before your next appointment please send [place] a message through [place] or call our office at [phone].    TO LEAVE A MESSAGE FOR THE NURSE SELECT OPTION 2, PLEASE LEAVE A MESSAGE INCLUDING: YOUR NAME DATE OF BIRTH CALL BACK NUMBER REASON FOR CALL**this is important as we prioritize the call backs  YOU WILL RECEIVE A CALL BACK THE SAME DAY AS LONG AS YOU CALL BEFORE 4:00 PM  At the [facility], you and your health needs are our priority. As part of our continuing mission to provide you with exceptional heart care, we have created designated Provider Care Teams. These Care Teams include your primary Cardiologist (physician) and Advanced Practice Providers (APPs- Physician Assistants and Nurse Practitioners) who all work together to provide you with the care you need, when you need it.   You may see any of the following providers on your designated Care Team at your next follow  up: Dr Glori Bickers Dr [name] Dr. Roxana Hires, NP Lyda Jester, [place] [facility] [place], [place] Forestine Na, NP Audry Riles, PharmD   Please be sure to bring in all your medications bottles to every appointment.    Thank you for choosing [facility]

## 2022-09-14 NOTE — Progress Notes (Addendum)
PCP: Seward Carol, MD EP: Dr. Rayann Heman Cardiology: Dr. Gwenlyn Found HF Cardiology: Dr. Aundra Dubin  72 y.o. with history of chronic atrial fibrillation and flutter with failed Maze procedure, valvular heart disease s/p mitral and aortic valve repairs, and chronic systolic CHF was referred by Dr. Rayann Heman for CHF evaluation.  Patient has a long history of atrial fibrillation.  He was initially controlled by Tikosyn but it eventually became chronic.  He had significant mitral and aortic regurgitation, and in 11/19, he had mitral and aortic valve repairs with Maze and LA appendage ligation. Cath prior to valve surgery in 10/19 showed no significant coronary disease.  It appears from ECGs that he never went back into NSR.  He has had slow atrial fibrillation with rate in upper 30s-40s at times, so was never started on amiodarone.  Last echo in 4/20 showed EF 40-45% with stable aortic and mitral valve repairs.  He has developed worsening volume overload, and Lasix has been titrated up to 80 mg bid.  Most recently, he went into atypical atrial flutter with HR consistently in the 110s range.  He therefore was taken for DCCV 03/10/19.  Initially rhythm looked junctional in the 40s-50s, but he subsequently went back into rapid atypical atrial flutter with elevated rate (>100 bpm).   Echo was done in 9/20, showing EF worse at 20-25%.     He saw Dr. Rayann Heman and had atrial flutter + atrial fibrillation ablation in 10/20.  Post-procedure, he was noted to be in slow atrial fibrillation (rate 40s) at time of his atrial fibrillation clinic followup.   Patient had St Jude CRT-D implantation in 2/21.  In 6/21, he had AV nodal ablation to allow CRT given recurrent atrial flutter/fibrillation. Echo in 10/21 showed EF up to 60-65%.    He was seen in atrial fibrillation clinic in 5/23 and was noted to be in atrial fibrillation since 4/23.  Thoracic impedance suggested fluid retention. He was in atrial fibrillation when I saw him in 5/23 and  was set up for DCCV in 6/23. He went back into NSR at the time but is in atrial fibrillation again today.   Echo in 7/23 showed EF 50-55%, global hypokinesis, mildly dilated and mildly dysfunctional RV, s/p MV repair with no MR and mean gradient 4, s/p aortic valve repair with mild AS mean gradient 11 and mild AI, normal IVC.   He returns today for followup of CHF.  Weight is up 10 lbs.  He reports recent dietary indiscretion (eating out a lot).  He is short of breath walking up hills, does fine on flat ground.  No orthopnea/PND.  Rare lightheadedness.  No chest pain.  No BRBPR/melena.  He has joint stiffness in multiple joints, PCP thinks he has rheumatoid arthritis.   ECG (personally reviewed): Atrial fibrillation with BiV pacing  St Jude device interrogation: >99% BiV pacing, thoracic impedance stable, he is in atrial fibrillation persistently, no VT.   Labs (8/20): K 3.9, creatinine 1.14, BNP 473, hgb 13.3 Labs (9/20): K 3.3 => 3.7, creatinine 1.35 => 1.3 Labs (12/20): K 4.1, creatinine 1.2 Labs (1/21): K 4.1, creatinine 1.39 Labs (8/21): K 4.3, creatinine 1.42, plts 136 Labs (12/21): K 3.3, creatinine 1.27 Labs (12/22): K 4.5, creatinine 1.27, hgb 14.3, pro-BNP 284 Labs (6/23): K 4.1, creatinine 1.2 Labs (7/23): K 4, creatinine 1.4, LDL 117  PMH: 1. HTN 2. Paroxysmal atrial fibrillation/flutter: Failed Tikosyn.  Had Maze procedure 11/19 but does not appear to have ever gone back into NSR.  Had been in atrial fibrillation with bradycardia with rate in 30s-40s at times, now in atypical atrial flutter primarily with rate 100s-110s.  He failed DCCV 8/20.  No amiodarone due to h/o bradycardia.  - Atrial fibrillation + atrial flutter ablation in 10/20.  - DCCV to NSR in 6/23 but quickly back into atrial fibrillation.  3. OSA: Uses CPAP.  4. Chronic systolic CHF: Nonischemic cardiomyopathy.  - 10/19 LHC: No coronary disease.  - Echo (4/20): EF 40-45%, mild LVH, moderate dilated RV with  mildly decreased systolic function, s/p MV repair with no significant stenosis or regurgitation, s/p aortic valve repair with mild AI, mild aortic stenosis.  Dilated IVC.  - Echo (9/20): EF 20-25%, mild LVH with moderate LV dilation, s/p MV repair with mean gradient 6, s/p AoV repair with mean gradient 8. No significant MR or AI.  - St Jude CRT-D implantation in 2/21.  - AV nodal ablation 6/21.  - Echo (10/21): EF 60-65%, mild LV enlargement and LVH, moderate RV enlargement with normal function, severe LVE, s/p MV repair with no MR and mean gradient 3, s/p aortic valve repair with mean gradient 9 and no AI, ascending aorta 4.6 cm.  - Echo (7/23): EF 50-55%, global hypokinesis, mildly dilated and mildly dysfunctional RV, s/p MV repair with no MR and mean gradient 4, s/p aortic valve repair with mild AS mean gradient 11 and mild AI, normal IVC.  5. Valvular heart disease: Patient has history of MR and AI.  In 11/19, he had mitral valve repair, aortic valve repair, surgical Maze, and LA appendage excision.  6. LBBB 7. Ascending aorta aneurysm: 4.6 cm on 10/21 echo.  - CTA chest (7/22): 4.1 cm ascending aorta 8. Bilateral THRs  Social History   Socioeconomic History   Marital status: Married    Spouse name: Not on file   Number of children: Not on file   Years of education: Not on file   Highest education level: Not on file  Occupational History   Occupation: Agricultural consultant  Tobacco Use   Smoking status: Never   Smokeless tobacco: Never  Vaping Use   Vaping Use: Never used  Substance and Sexual Activity   Alcohol use: No   Drug use: No   Sexual activity: Yes  Other Topics Concern   Not on file  Social History Narrative   Pt lives in [place] with spouse.  Leadership training and behavioral safety training.   Social Determinants of Health   Financial Resource Strain: Not on file  Food Insecurity: Not on file  Transportation Needs: No Transportation Needs  (09/27/2019)   PRAPARE - Hydrologist (Medical): No    Lack of Transportation (Non-Medical): No  Physical Activity: Not on file  Stress: Not on file  Social Connections: Not on file  Intimate Partner Violence: Not on file   Family History  Problem Relation Age of Onset   Other Other        mother had a pacemaker   Hypertension Father    ROS: All systems reviewed and negative except as per HPI.   Current Outpatient Medications  Medication Sig Dispense Refill   acetaminophen (TYLENOL) 325 MG tablet Take 2 tablets (650 mg total) by mouth every 4 (four) hours as needed for headache or mild pain.     carvedilol (COREG) 6.25 MG tablet Take 1 tablet (6.25 mg total) by mouth 2 (two) times daily with a meal. NEEDS FOLLOW UP APPOINTMENT  FOR ANYMORE REFILLS 180 tablet 0   clobetasol ointment (TEMOVATE) [id number] % Apply 1 application topically daily as needed (rash).     diclofenac Sodium (VOLTAREN) 1 % GEL Apply 2 g topically daily as needed (pain).      ENTRESTO 24-26 MG TAKE 1 TABLET BY MOUTH TWICE A DAY 60 tablet 5   HYDROcodone-acetaminophen (NORCO/VICODIN) 5-325 MG tablet Take 1 tablet by mouth every 8 (eight) hours as needed for moderate pain.     MAGNESIUM PO Take 1 tablet by mouth 2 (two) times a week.     Multiple Vitamin (MULTIVITAMIN WITH MINERALS) TABS tablet Take 1 tablet by mouth daily.     NON FORMULARY Pt uses a cpap nightly     potassium chloride SA (KLOR-CON M) 20 MEQ tablet Take 1 tablet (20 mEq total) by mouth daily. 90 tablet 3   spironolactone (ALDACTONE) 25 MG tablet TAKE 1 TABLET BY MOUTH EVERY DAY 90 tablet 3   tadalafil (CIALIS) 10 MG tablet Take 10 mg by mouth daily as needed for erectile dysfunction.     torsemide (DEMADEX) 20 MG tablet Take 20 mg by mouth daily.     XARELTO 20 MG TABS tablet TAKE ONE TABLET BY MOUTH DAILY WITH SUPPER 30 tablet 10   dapagliflozin propanediol (FARXIGA) 10 MG TABS tablet Take 1 tablet (10 mg total) by mouth daily  before breakfast. 90 tablet 3   No current facility-administered medications for this encounter.   BP 128/74   Pulse 64   Wt 129.3 kg (285 lb)   SpO2 94%   BMI 40.89 kg/m  General: NAD Neck: No JVD, no thyromegaly or thyroid nodule.  Lungs: Clear to auscultation bilaterally with normal respiratory effort. CV: Nondisplaced PMI.  Heart regular S1/S2, no S3/S4, no murmur.  No peripheral edema.  No carotid bruit.  Normal pedal pulses.  Abdomen: Soft, nontender, no hepatosplenomegaly, no distention.  Skin: Intact without lesions or rashes.  Neurologic: Alert and oriented x 3.  Psych: Normal affect. Extremities: No clubbing or cyanosis.  HEENT: Normal.   Assessment/Plan: 1. Chronic systolic CHF: Echo in [id number] with EF 40-45%, moderate RV dilation/mildly decreased RV function.  Nonischemic cardiomyopathy, cath in 10/19 without significant coronary disease.  Repeat echo in 9/20 showed fall in EF to 20-25% in the setting of atrial fibrillation and atrial flutter with persistent tachycardia.  He is now s/p St Jude CRT-D and AV nodal ablation.  Echo in 10/21 with EF up to 60-65%. Echo in 7/23 showed EF 50-55%, global hypokinesis, mildly dilated and mildly dysfunctional RV, s/p MV repair with no MR and mean gradient 4, s/p aortic valve repair with mild AS mean gradient 11 and mild AI, normal IVC.  NYHA class II symptoms.  Weight is up but not volume overloaded by exam or Corvue.  - Continue torsemide 20 mg daily.  BMET/BNP today.   - Continue spironolactone 25 mg daily.   - Continue Entresto 24/26 bid.    - Continue Coreg 6.25 mg bid.  - Repeat echo at followup in 4 months.  2. Atrial arrhythmias: Long history of chronic atrial fibrillation, has had slow ventricular response in the past (HR 40s at times). He failed Tikosyn and have avoided amiodarone with bradycardia.  He had Maze procedure in 11/19 but it was not successful.  At initial appointment with me, he was in atypical flutter with HR 110s.  He  failed DCCV in 8/20.  He had flutter/fibrillation ablation in 10/20.  Initially after ablation, he  was in slow atrial fibrillation (rate upper 30s-50s generally).  He is now s/p AV nodal ablation with CRT-D.  He has been in atrial fibrillation persistently.  Cardioversion was attempted in 6/23 but did not hold.  As he is BiV pacing > 99% of the time in atrial fibrillation s/p AVN ablation, I think we can hold off on trying cardioversion again.  - He will continue Xarelto.  CBC today.  3. OSA: Continue CPAP.  4. Valvular heart disease: S/p MV and AoV repairs in 11/19.  Valves looked stable on 7/23  echo.  - He will need antibiotic prophylaxis with dental work.  5. Ascending aortic aneurysm: 4.6 cm on 10/21 echo. CTA chest in 7/22 showed 4.1 cm ascending aorta.  6. Joint pain: Patient has pain/stiffness in multiple joints.  His PCP told him that he thinks he has rheumatoid arthritis.  He does not have rheumatology appt.  - Will refer to rheumatology.  7. Obesity: I will see if his insurance will cover semaglutide/tirzepatide.  If not, will start him on Farxiga.   Followup in 4 months with echo.   [name] 09/14/2022

## 2022-09-14 NOTE — Addendum Note (Signed)
Encounter addended by: Larey Dresser, MD on: 09/14/2022 4:26 PM  Actions taken: Clinical Note Signed

## 2022-09-16 ENCOUNTER — Ambulatory Visit (INDEPENDENT_AMBULATORY_CARE_PROVIDER_SITE_OTHER): Payer: Medicare HMO

## 2022-09-16 DIAGNOSIS — I442 Atrioventricular block, complete: Secondary | ICD-10-CM

## 2022-09-17 LAB — CUP PACEART REMOTE DEVICE CHECK
Battery Remaining Longevity: 52 mo
Battery Remaining Percentage: 55 %
Battery Voltage: 2.95 V
Brady Statistic AP VP Percent: 0 %
Brady Statistic AP VS Percent: 0 %
Brady Statistic AS VP Percent: 0 %
Brady Statistic AS VS Percent: 0 %
Brady Statistic RA Percent Paced: 1 %
Date Time Interrogation Session: 20240305020303
HighPow Impedance: 86 Ohm
Implantable Lead Connection Status: 753985
Implantable Lead Connection Status: 753985
Implantable Lead Connection Status: 753985
Implantable Lead Implant Date: 20210209
Implantable Lead Implant Date: 20210209
Implantable Lead Implant Date: 20210209
Implantable Lead Location: 753858
Implantable Lead Location: 753859
Implantable Lead Location: 753860
Implantable Pulse Generator Implant Date: 20210209
Lead Channel Impedance Value: 400 Ohm
Lead Channel Impedance Value: 500 Ohm
Lead Channel Impedance Value: 980 Ohm
Lead Channel Pacing Threshold Amplitude: 0.75 V
Lead Channel Pacing Threshold Amplitude: 0.75 V
Lead Channel Pacing Threshold Amplitude: 1.5 V
Lead Channel Pacing Threshold Pulse Width: 0.5 ms
Lead Channel Pacing Threshold Pulse Width: 0.5 ms
Lead Channel Pacing Threshold Pulse Width: 0.5 ms
Lead Channel Sensing Intrinsic Amplitude: 1.6 mV
Lead Channel Sensing Intrinsic Amplitude: 12 mV
Lead Channel Setting Pacing Amplitude: 2 V
Lead Channel Setting Pacing Amplitude: 2.5 V
Lead Channel Setting Pacing Amplitude: 2.5 V
Lead Channel Setting Pacing Pulse Width: 0.5 ms
Lead Channel Setting Pacing Pulse Width: 0.5 ms
Lead Channel Setting Sensing Sensitivity: 0.5 mV
Pulse Gen Serial Number: 111006442
Zone Setting Status: 755011

## 2022-09-29 ENCOUNTER — Other Ambulatory Visit (HOSPITAL_COMMUNITY)
Admission: RE | Admit: 2022-09-29 | Discharge: 2022-09-29 | Disposition: A | Discharge status: Home / Self Care | Payer: Medicare HMO | Source: Ambulatory Visit | Attending: Cardiology | Admitting: Cardiology

## 2022-09-29 ENCOUNTER — Telehealth (HOSPITAL_COMMUNITY): Payer: Self-pay | Admitting: Cardiology

## 2022-09-29 DIAGNOSIS — I5022 Chronic systolic (congestive) heart failure: Secondary | ICD-10-CM | POA: Diagnosis not present

## 2022-09-29 LAB — BASIC METABOLIC PANEL
Anion gap: 8 (ref 5–15)
BUN: 39 mg/dL — ABNORMAL HIGH (ref 8–23)
CO2: 25 mmol/L (ref 22–32)
Calcium: 9.1 mg/dL (ref 8.9–10.3)
Chloride: 104 mmol/L (ref 98–111)
Creatinine, Ser: 1.2 mg/dL (ref 0.61–1.24)
GFR, Estimated: >60 mL/min (ref >60)
Glucose, Bld: 96 mg/dL (ref 70–99)
Potassium: 3.8 mmol/L (ref 3.5–5.1)
Sodium: 137 mmol/L (ref 135–145)

## 2022-09-29 NOTE — Telephone Encounter (Signed)
Pt called to check the status of rheumatology referral -referral sent to [facility] -referral closed per epic referral note: missing information -detailed message left for [facility] staff to clarify what is missing as notes/labs are in epic

## 2022-09-30 NOTE — Telephone Encounter (Signed)
-  pt aware referral re faxed  During call pt reports he stopped farxiga (Message to provider as Juluis Rainier)

## 2022-09-30 NOTE — Telephone Encounter (Signed)
error 

## 2022-09-30 NOTE — Telephone Encounter (Signed)
Per [employer] [facility] staff notes or documentation not received to complete referral Katharine Look x [phone]) Notes sent to complete referral

## 2022-09-30 NOTE — Addendum Note (Signed)
Addended by: Kerry Dory on: 09/30/2022 04:55 PM   Modules accepted: Orders

## 2022-10-02 ENCOUNTER — Other Ambulatory Visit (HOSPITAL_COMMUNITY): Payer: Self-pay

## 2022-10-02 MED ORDER — POTASSIUM CHLORIDE CRYS ER 20 MEQ PO TBCR: 20.0000 meq | EXTENDED_RELEASE_TABLET | Freq: Every day | ORAL | 3 refills | Status: DC

## 2022-10-08 DIAGNOSIS — M7061 Trochanteric bursitis, right hip: Secondary | ICD-10-CM | POA: Diagnosis not present

## 2022-10-13 ENCOUNTER — Ambulatory Visit: Payer: Medicare HMO | Attending: Cardiology

## 2022-10-13 DIAGNOSIS — I5022 Chronic systolic (congestive) heart failure: Secondary | ICD-10-CM

## 2022-10-13 DIAGNOSIS — Z95810 Presence of automatic (implantable) cardiac defibrillator: Secondary | ICD-10-CM | POA: Diagnosis not present

## 2022-10-17 NOTE — Progress Notes (Signed)
EPIC Encounter for ICM Monitoring  Patient Name: [name] is a 72 y.o. male Date: 10/17/2022 Primary Care Physican: Exie Parody Primary Cardiologist: Shirlee Latch Electrophysiologist: Townsend Roger Pacing: >99%        12/02/2021 Office Weight: 267 lbs 12/10/2021 Office Weight: 276 lbs 12/17/2021 Weight:  Has not weighed since last OV 09/12/2022 Office Weight: 285 lbs                                                          AT/AF Burden: 100% (taking Xarelto)    Spoke with patient and heart failure questions reviewed.  Transmission results reviewed.  Pt reports he is doing very well.  He has a [name] vacation scheduled in May but will be cooking a lot of meals instead of eating out so much.     CorVue thoracic impedance suggesting possible fluid accumulation from 3/23-3/26.   Prescribed: Torsemide 20 mg take 1 tablet (20 mg total) daily.   Klor Con 20 mEq take take 1 tablet daily  Spironolactone 25 mg take 1 tablet daily   Labs: 09/29/2022 Creatinine 1.20, BUN 39, Potassium 3.8, Sodium 137, GFR >60  09/12/2022 Creatinine 1.19, BUN 28, Potassium 4.3, Sodium 138, GFR >60  01/17/2022 Creatinine 1.40, BUN 34, Potassium 4.0, Sodium 138, GFR 54 12/10/2021 Creatinine 1.33, BUN 29, Potassium 4.3, Sodium 139, GFR 58 A complete set of results can be found in Results Review.   Recommendations:  Encouraged to call if experiencing any fluid symptoms.    Follow-up plan: ICM clinic phone appointment on 11/24/2022 (out of town 5/3-5/6).   91 day device clinic remote transmission 09/16/2022.     EP/Cardiology Office Visits:  Recall 08/06/2023 with Dr Lalla Brothers.  Recall 01/10/2023 with Dr Shirlee Latch.     Copy of ICM check sent to Dr. Lalla Brothers.    3 month ICM trend: 10/13/2022.    12-14 Month ICM trend:     Karie Soda, RN 10/17/2022 12:13 PM

## 2022-10-22 NOTE — Progress Notes (Signed)
Remote ICD transmission.   

## 2022-10-25 ENCOUNTER — Other Ambulatory Visit (HOSPITAL_COMMUNITY): Payer: Self-pay | Admitting: Cardiology

## 2022-11-02 ENCOUNTER — Other Ambulatory Visit: Payer: Self-pay | Admitting: Cardiology

## 2022-11-02 DIAGNOSIS — I5022 Chronic systolic (congestive) heart failure: Secondary | ICD-10-CM

## 2022-11-02 DIAGNOSIS — I484 Atypical atrial flutter: Secondary | ICD-10-CM

## 2022-11-24 ENCOUNTER — Ambulatory Visit: Payer: Medicare HMO | Attending: Cardiology

## 2022-11-24 DIAGNOSIS — Z95810 Presence of automatic (implantable) cardiac defibrillator: Secondary | ICD-10-CM | POA: Diagnosis not present

## 2022-11-24 DIAGNOSIS — I5022 Chronic systolic (congestive) heart failure: Secondary | ICD-10-CM | POA: Diagnosis not present

## 2022-11-26 NOTE — Progress Notes (Signed)
EPIC Encounter for ICM Monitoring  Patient Name: [name] is a 72 y.o. male Date: 11/26/2022 Primary Care Physican: Exie Parody Primary Cardiologist: Shirlee Latch Electrophysiologist: Townsend Roger Pacing: >99%        12/02/2021 Office Weight: 267 lbs 12/10/2021 Office Weight: 276 lbs 12/17/2021 Weight:  Has not weighed since last OV 09/12/2022 Office Weight: 285 lbs                                                          AT/AF Burden: 100% (taking Xarelto)    Spoke with patient and heart failure questions reviewed.  Transmission results reviewed.  Pt asymptomatic for fluid accumulation.  Pt feels exhausted and wonders if his heart is pumping right.  Discussed using mychart to reach Dr Alford Highland office but he is not listed in his [employer].  Provided mychart Help desk number.     CorVue thoracic impedance normal but was suggesting possible fluid accumulation from 5/1-5/6 (out of town) and 5/7-5/11.   Prescribed: Torsemide 20 mg take 1 tablet (20 mg total) daily.   Klor Con 20 mEq take take 1 tablet daily  Spironolactone 25 mg take 1 tablet daily   Labs: 09/29/2022 Creatinine 1.20, BUN 39, Potassium 3.8, Sodium 137, GFR >60  09/12/2022 Creatinine 1.19, BUN 28, Potassium 4.3, Sodium 138, GFR >60  01/17/2022 Creatinine 1.40, BUN 34, Potassium 4.0, Sodium 138, GFR 54 12/10/2021 Creatinine 1.33, BUN 29, Potassium 4.3, Sodium 139, GFR 58 A complete set of results can be found in Results Review.   Recommendations:   No changes and encouraged to call if experiencing any fluid symptoms.   Follow-up plan: ICM clinic phone appointment on 12/29/2022.   91 day device clinic remote transmission 12/16/2022.     EP/Cardiology Office Visits:  Recall 08/06/2023 with Dr Lalla Brothers.  02/04/2023 with Dr Shirlee Latch.     Copy of ICM check sent to Dr. Lalla Brothers.    3 month ICM trend: 11/24/2022.    12-14 Month ICM trend:     Karie Soda, RN 11/26/2022 8:24 AM

## 2022-12-11 DIAGNOSIS — M5451 Vertebrogenic low back pain: Secondary | ICD-10-CM | POA: Diagnosis not present

## 2022-12-15 DIAGNOSIS — M5451 Vertebrogenic low back pain: Secondary | ICD-10-CM | POA: Diagnosis not present

## 2022-12-16 ENCOUNTER — Ambulatory Visit (INDEPENDENT_AMBULATORY_CARE_PROVIDER_SITE_OTHER): Payer: Medicare HMO

## 2022-12-16 DIAGNOSIS — I442 Atrioventricular block, complete: Secondary | ICD-10-CM

## 2022-12-16 DIAGNOSIS — I5022 Chronic systolic (congestive) heart failure: Secondary | ICD-10-CM

## 2022-12-17 LAB — CUP PACEART REMOTE DEVICE CHECK
Battery Remaining Longevity: 48 mo
Battery Remaining Percentage: 53 %
Battery Voltage: 2.95 V
Brady Statistic AP VP Percent: 0 %
Brady Statistic AP VS Percent: 0 %
Brady Statistic AS VP Percent: 0 %
Brady Statistic AS VS Percent: 0 %
Brady Statistic RA Percent Paced: 1 %
Date Time Interrogation Session: 20240604020354
HighPow Impedance: 87 Ohm
Implantable Lead Connection Status: 753985
Implantable Lead Connection Status: 753985
Implantable Lead Connection Status: 753985
Implantable Lead Implant Date: 20210209
Implantable Lead Implant Date: 20210209
Implantable Lead Implant Date: 20210209
Implantable Lead Location: 753858
Implantable Lead Location: 753859
Implantable Lead Location: 753860
Implantable Pulse Generator Implant Date: 20210209
Lead Channel Impedance Value: 400 Ohm
Lead Channel Impedance Value: 500 Ohm
Lead Channel Impedance Value: 880 Ohm
Lead Channel Pacing Threshold Amplitude: 0.75 V
Lead Channel Pacing Threshold Amplitude: 0.75 V
Lead Channel Pacing Threshold Amplitude: 1.5 V
Lead Channel Pacing Threshold Pulse Width: 0.5 ms
Lead Channel Pacing Threshold Pulse Width: 0.5 ms
Lead Channel Pacing Threshold Pulse Width: 0.5 ms
Lead Channel Sensing Intrinsic Amplitude: 1.6 mV
Lead Channel Sensing Intrinsic Amplitude: 12 mV
Lead Channel Setting Pacing Amplitude: 2 V
Lead Channel Setting Pacing Amplitude: 2.5 V
Lead Channel Setting Pacing Amplitude: 2.5 V
Lead Channel Setting Pacing Pulse Width: 0.5 ms
Lead Channel Setting Pacing Pulse Width: 0.5 ms
Lead Channel Setting Sensing Sensitivity: 0.5 mV
Pulse Gen Serial Number: 111006442
Zone Setting Status: 755011

## 2022-12-29 ENCOUNTER — Ambulatory Visit: Payer: Medicare HMO | Attending: Cardiology

## 2022-12-29 ENCOUNTER — Telehealth: Payer: Self-pay

## 2022-12-29 DIAGNOSIS — M545 Low back pain, unspecified: Secondary | ICD-10-CM | POA: Insufficient documentation

## 2022-12-29 DIAGNOSIS — Z95810 Presence of automatic (implantable) cardiac defibrillator: Secondary | ICD-10-CM

## 2022-12-29 DIAGNOSIS — I5022 Chronic systolic (congestive) heart failure: Secondary | ICD-10-CM

## 2022-12-29 NOTE — Progress Notes (Signed)
EPIC Encounter for ICM Monitoring  Patient Name: [name] is a 72 y.o. male Date: 12/29/2022 Primary Care Physican: Exie Parody Primary Cardiologist: Shirlee Latch Electrophysiologist: Townsend Roger Pacing: >99%        12/02/2021 Office Weight: 267 lbs 12/10/2021 Office Weight: 276 lbs 12/17/2021 Weight:  Has not weighed since last OV 09/12/2022 Office Weight: 285 lbs                                                          AT/AF Burden: 100% (taking Xarelto)    Attempted call to patient and unable to reach.  Left detailed message per DPR regarding transmission. Transmission reviewed.    CorVue thoracic impedance suggesting possible fluid accumulation starting 6/11 but trending back toward baseline.  Also suggesting possible fluid accumulation from 5/21-5/30 and possible dryness from 5/30-6/10.   Prescribed: Torsemide 20 mg take 1 tablet (20 mg total) daily.   Klor Con 20 mEq take take 1 tablet daily  Spironolactone 25 mg take 1 tablet daily   Labs: 09/29/2022 Creatinine 1.20, BUN 39, Potassium 3.8, Sodium 137, GFR >60  09/12/2022 Creatinine 1.19, BUN 28, Potassium 4.3, Sodium 138, GFR >60  01/17/2022 Creatinine 1.40, BUN 34, Potassium 4.0, Sodium 138, GFR 54 12/10/2021 Creatinine 1.33, BUN 29, Potassium 4.3, Sodium 139, GFR 58 A complete set of results can be found in Results Review.   Recommendations:   Left voice mail with ICM number and encouraged to call if experiencing any fluid symptoms.   Follow-up plan: ICM clinic phone appointment on 01/05/2023 to recheck fluid levels.   91 day device clinic remote transmission 12/16/2022.     EP/Cardiology Office Visits:  Recall 08/06/2023 with Dr Lalla Brothers.  02/04/2023 with Dr Shirlee Latch.     Copy of ICM check sent to Dr. Lalla Brothers.    3 month ICM trend: 12/29/2022.    12-14 Month ICM trend:     Karie Soda, RN 12/29/2022 1:58 PM

## 2022-12-29 NOTE — Progress Notes (Signed)
Spoke with patient and heart failure questions reviewed.  Transmission results reviewed.  Pt reports swelling of feet correlating with decreased impedance starting 6/11.  He reports he was taking steroids for back and not eating or drinking much during possible dryness from 5/30-6/10 which may resulted in possible fluid accumulation.  Advised to limit fluid intake to 64 oz daily and will recheck remote transmission 6/24.  Advised to call back if condition changes.

## 2022-12-29 NOTE — Telephone Encounter (Signed)
Remote ICM transmission received.  Attempted call to patient regarding ICM remote transmission and left detailed message per DPR.  Left ICM phone number and advised to return call for any fluid symptoms or questions. Next ICM remote transmission scheduled 01/05/2023.    

## 2022-12-30 DIAGNOSIS — M545 Low back pain, unspecified: Secondary | ICD-10-CM | POA: Diagnosis not present

## 2023-01-05 ENCOUNTER — Ambulatory Visit: Payer: Medicare HMO | Attending: Cardiology

## 2023-01-05 DIAGNOSIS — Z95810 Presence of automatic (implantable) cardiac defibrillator: Secondary | ICD-10-CM

## 2023-01-05 DIAGNOSIS — I5022 Chronic systolic (congestive) heart failure: Secondary | ICD-10-CM

## 2023-01-05 NOTE — Progress Notes (Signed)
EPIC Encounter for ICM Monitoring  Patient Name: [name] is a 72 y.o. male Date: 01/05/2023 Primary Care Physican: Exie Parody Primary Cardiologist: Shirlee Latch Electrophysiologist: Townsend Roger Pacing: >99%        12/02/2021 Office Weight: 267 lbs 12/10/2021 Office Weight: 276 lbs 12/17/2021 Weight:  Has not weighed since last OV 09/12/2022 Office Weight: 285 lbs                                                          AT/AF Burden: 100% (taking Xarelto)    Spoke with patient and heart failure questions reviewed.  Transmission results reviewed.  Pt still having back issues.     CorVue thoracic impedance suggesting possible fluid accumulation starting 6/11 and worsening on 6/18 but starting to trend back toward baseline.     Prescribed: Torsemide 20 mg take 1 tablet (20 mg total) daily.   Klor Con 20 mEq take take 1 tablet daily  Spironolactone 25 mg take 1 tablet daily   Labs: 09/29/2022 Creatinine 1.20, BUN 39, Potassium 3.8, Sodium 137, GFR >60  09/12/2022 Creatinine 1.19, BUN 28, Potassium 4.3, Sodium 138, GFR >60  01/17/2022 Creatinine 1.40, BUN 34, Potassium 4.0, Sodium 138, GFR 54 12/10/2021 Creatinine 1.33, BUN 29, Potassium 4.3, Sodium 139, GFR 58 A complete set of results can be found in Results Review.   Recommendations:   He will take extra Torsemide today and tomorrow if needed.     Follow-up plan: ICM clinic phone appointment on 01/07/2023 (manual) to recheck fluid levels.   91 day device clinic remote transmission 03/17/2023.     EP/Cardiology Office Visits:  Recall 08/06/2023 with Dr Lalla Brothers.  02/04/2023 with Dr Shirlee Latch.     Copy of ICM check sent to Dr. Lalla Brothers.    3 month ICM trend: 01/05/2023.    12-14 Month ICM trend:     Karie Soda, RN 01/05/2023 1:32 PM

## 2023-01-07 ENCOUNTER — Ambulatory Visit: Payer: Medicare HMO | Attending: Cardiology

## 2023-01-07 ENCOUNTER — Other Ambulatory Visit (HOSPITAL_COMMUNITY): Payer: Self-pay | Admitting: Specialist

## 2023-01-07 DIAGNOSIS — M5136 Other intervertebral disc degeneration, lumbar region: Secondary | ICD-10-CM | POA: Diagnosis not present

## 2023-01-07 DIAGNOSIS — M5451 Vertebrogenic low back pain: Secondary | ICD-10-CM | POA: Diagnosis not present

## 2023-01-07 DIAGNOSIS — Z95810 Presence of automatic (implantable) cardiac defibrillator: Secondary | ICD-10-CM

## 2023-01-07 DIAGNOSIS — M5459 Other low back pain: Secondary | ICD-10-CM

## 2023-01-07 DIAGNOSIS — I5022 Chronic systolic (congestive) heart failure: Secondary | ICD-10-CM

## 2023-01-07 NOTE — Progress Notes (Signed)
EPIC Encounter for ICM Monitoring  Patient Name: [name] is a 72 y.o. male Date: 01/07/2023 Primary Care Physican: Exie Parody Primary Cardiologist: Shirlee Latch Electrophysiologist: Townsend Roger Pacing: >99%        12/02/2021 Office Weight: 267 lbs 12/10/2021 Office Weight: 276 lbs 12/17/2021 Weight:  Has not weighed since last OV 09/12/2022 Office Weight: 285 lbs                                                          AT/AF Burden: 100% (taking Xarelto)    Spoke with patient and heart failure questions reviewed.  Transmission results reviewed.  Pt doubled Torsemide x 1 day and feeling fine    CorVue thoracic impedance suggesting fluid levels returned to normal after taking extra Torsemide x 1 day.     Prescribed: Torsemide 20 mg take 1 tablet (20 mg total) daily.   Klor Con 20 mEq take take 1 tablet daily  Spironolactone 25 mg take 1 tablet daily   Labs: 09/29/2022 Creatinine 1.20, BUN 39, Potassium 3.8, Sodium 137, GFR >60  09/12/2022 Creatinine 1.19, BUN 28, Potassium 4.3, Sodium 138, GFR >60  01/17/2022 Creatinine 1.40, BUN 34, Potassium 4.0, Sodium 138, GFR 54 12/10/2021 Creatinine 1.33, BUN 29, Potassium 4.3, Sodium 139, GFR 58 A complete set of results can be found in Results Review.   Recommendations:  No changes and encouraged to call if experiencing any fluid symptoms.   Follow-up plan: ICM clinic phone appointment on 7/22.   91 day device clinic remote transmission 03/17/2023.     EP/Cardiology Office Visits:  Recall 08/06/2023 with Dr Lalla Brothers.  02/04/2023 with Dr Shirlee Latch.     Copy of ICM check sent to Dr. Lalla Brothers.     3 month ICM trend: 01/07/2023.    12-14 Month ICM trend:     Karie Soda, RN 01/07/2023 1:27 PM

## 2023-01-08 NOTE — Progress Notes (Signed)
Remote ICD transmission.   

## 2023-01-09 DIAGNOSIS — G4733 Obstructive sleep apnea (adult) (pediatric): Secondary | ICD-10-CM | POA: Diagnosis not present

## 2023-01-14 ENCOUNTER — Telehealth (HOSPITAL_COMMUNITY): Payer: Self-pay | Admitting: Cardiology

## 2023-01-14 NOTE — Telephone Encounter (Signed)
Call returned to Pt.  Advised device was MRI compatible.  Pt was aware it was compatible, he was questioning it having to be done at the hospital and the wait was 2 months.  Apologized for the inconvenience.  Advised having it at the hospital is the correct course of action.  Pt indicates understanding.

## 2023-01-14 NOTE — Telephone Encounter (Signed)
Pt let message on triage line with questions regarding pacemaker MRI compatibility.  Reports  he has one coming up and wanted to be sure his device is ok for mri machines otherwise location of mri will be changed   -will forward to EP team for assistance

## 2023-02-02 ENCOUNTER — Ambulatory Visit: Payer: Medicare HMO

## 2023-02-02 DIAGNOSIS — Z95810 Presence of automatic (implantable) cardiac defibrillator: Secondary | ICD-10-CM

## 2023-02-02 DIAGNOSIS — I5022 Chronic systolic (congestive) heart failure: Secondary | ICD-10-CM | POA: Diagnosis not present

## 2023-02-04 ENCOUNTER — Other Ambulatory Visit (HOSPITAL_COMMUNITY): Payer: Self-pay

## 2023-02-04 ENCOUNTER — Other Ambulatory Visit: Payer: Self-pay

## 2023-02-04 ENCOUNTER — Telehealth (HOSPITAL_COMMUNITY): Payer: Self-pay | Admitting: Pharmacy Technician

## 2023-02-04 ENCOUNTER — Ambulatory Visit (HOSPITAL_BASED_OUTPATIENT_CLINIC_OR_DEPARTMENT_OTHER)
Admission: RE | Admit: 2023-02-04 | Discharge: 2023-02-04 | Disposition: A | Discharge status: Home / Self Care | Payer: Medicare HMO | Source: Ambulatory Visit | Attending: Cardiology | Admitting: Cardiology

## 2023-02-04 ENCOUNTER — Encounter (HOSPITAL_COMMUNITY): Payer: Self-pay | Admitting: Cardiology

## 2023-02-04 ENCOUNTER — Ambulatory Visit (HOSPITAL_COMMUNITY)
Admission: RE | Admit: 2023-02-04 | Discharge: 2023-02-04 | Disposition: A | Discharge status: Home / Self Care | Payer: Medicare HMO | Source: Ambulatory Visit | Attending: Physician Assistant | Admitting: Physician Assistant

## 2023-02-04 VITALS — BP 124/80 | HR 61 | Wt 278.2 lb

## 2023-02-04 DIAGNOSIS — G4733 Obstructive sleep apnea (adult) (pediatric): Secondary | ICD-10-CM | POA: Insufficient documentation

## 2023-02-04 DIAGNOSIS — R000 Tachycardia, unspecified: Secondary | ICD-10-CM | POA: Insufficient documentation

## 2023-02-04 DIAGNOSIS — I4892 Unspecified atrial flutter: Secondary | ICD-10-CM | POA: Diagnosis not present

## 2023-02-04 DIAGNOSIS — I110 Hypertensive heart disease with heart failure: Secondary | ICD-10-CM | POA: Diagnosis not present

## 2023-02-04 DIAGNOSIS — Z79899 Other long term (current) drug therapy: Secondary | ICD-10-CM | POA: Insufficient documentation

## 2023-02-04 DIAGNOSIS — I5022 Chronic systolic (congestive) heart failure: Secondary | ICD-10-CM

## 2023-02-04 DIAGNOSIS — Z9889 Other specified postprocedural states: Secondary | ICD-10-CM | POA: Insufficient documentation

## 2023-02-04 DIAGNOSIS — I428 Other cardiomyopathies: Secondary | ICD-10-CM | POA: Insufficient documentation

## 2023-02-04 DIAGNOSIS — I080 Rheumatic disorders of both mitral and aortic valves: Secondary | ICD-10-CM | POA: Diagnosis not present

## 2023-02-04 DIAGNOSIS — Z7901 Long term (current) use of anticoagulants: Secondary | ICD-10-CM | POA: Diagnosis not present

## 2023-02-04 DIAGNOSIS — E669 Obesity, unspecified: Secondary | ICD-10-CM | POA: Diagnosis not present

## 2023-02-04 DIAGNOSIS — M2550 Pain in unspecified joint: Secondary | ICD-10-CM | POA: Insufficient documentation

## 2023-02-04 LAB — BASIC METABOLIC PANEL
Anion gap: 9 (ref 5–15)
BUN: 30 mg/dL — ABNORMAL HIGH (ref 8–23)
CO2: 26 mmol/L (ref 22–32)
Calcium: 9.5 mg/dL (ref 8.9–10.3)
Chloride: 97 mmol/L — ABNORMAL LOW (ref 98–111)
Creatinine, Ser: 1.3 mg/dL — ABNORMAL HIGH (ref 0.61–1.24)
GFR, Estimated: 59 mL/min — ABNORMAL LOW (ref >60)
Glucose, Bld: 98 mg/dL (ref 70–99)
Potassium: 4.1 mmol/L (ref 3.5–5.1)
Sodium: 132 mmol/L — ABNORMAL LOW (ref 135–145)

## 2023-02-04 LAB — ECHOCARDIOGRAM COMPLETE
AR max vel: 1.67 cm2
AV Area VTI: 1.7 cm2
AV Area mean vel: 1.71 cm2
AV Mean grad: 12 mmHg
AV Peak grad: 19.7 mmHg
Ao pk vel: 2.22 m/s
Area-P 1/2: 4.26 cm2
Calc EF: 52.8 %
Est EF: 55
MV VTI: 1.72 cm2
S' Lateral: 4.2 cm
Single Plane A2C EF: 50.3 %
Single Plane A4C EF: 54.9 %

## 2023-02-04 LAB — VITAMIN B12: Vitamin B-12: 566 pg/mL (ref 180–914)

## 2023-02-04 LAB — HEMOGLOBIN A1C
Hgb A1c MFr Bld: 5.6 % (ref 4.8–5.6)
Mean Plasma Glucose: 114.02 mg/dL

## 2023-02-04 LAB — BRAIN NATRIURETIC PEPTIDE: B Natriuretic Peptide: 117.6 pg/mL — ABNORMAL HIGH (ref 0.0–100.0)

## 2023-02-04 MED ORDER — DAPAGLIFLOZIN PROPANEDIOL 10 MG PO TABS: 10.0000 mg | ORAL_TABLET | Freq: Every day | ORAL | 11 refills | Status: DC

## 2023-02-04 NOTE — Progress Notes (Signed)
EPIC Encounter for ICM Monitoring  Patient Name: [name] is a 72 y.o. male Date: 02/04/2023 Primary Care Physican: Exie Parody Primary Cardiologist: Shirlee Latch Electrophysiologist: Townsend Roger Pacing: >99%        12/02/2021 Office Weight: 267 lbs 12/10/2021 Office Weight: 276 lbs 12/17/2021 Weight:  Has not weighed since last OV 09/12/2022 Office Weight: 285 lbs 02/04/2023 Weight: 278 lbs                                                          AT/AF Burden: 100% (taking Xarelto)    Spoke with patient and heart failure questions reviewed.  Transmission results reviewed.  Pt asymptomatic for fluid accumulation.  Reports feeling well at this time and voices no complaints.     CorVue thoracic impedance suggesting normal fluid levels with the exception of possible fluid accumulation from 7/5-7/11 and 7/13-7/17.     Prescribed: Torsemide 20 mg take 1 tablet (20 mg total) daily.   Klor Con 20 mEq take take 1 tablet daily  Spironolactone 25 mg take 1 tablet daily   Labs: 02/04/2023 Creatinine 1.30, BUN 30, Potassium 4.1, Sodium 132, GFR 59 09/29/2022 Creatinine 1.20, BUN 39, Potassium 3.8, Sodium 137, GFR >60  09/12/2022 Creatinine 1.19, BUN 28, Potassium 4.3, Sodium 138, GFR >60  01/17/2022 Creatinine 1.40, BUN 34, Potassium 4.0, Sodium 138, GFR 54 12/10/2021 Creatinine 1.33, BUN 29, Potassium 4.3, Sodium 139, GFR 58 A complete set of results can be found in Results Review.   Recommendations:  No changes and encouraged to call if experiencing any fluid symptoms.   Follow-up plan: ICM clinic phone appointment on 03/09/2023.   91 day device clinic remote transmission 03/17/2023.     EP/Cardiology Office Visits:  Recall 08/06/2023 with Dr Lalla Brothers. 05/19/2023 with HF Clinic.     Copy of ICM check sent to Dr. Lalla Brothers.     3 month ICM trend: 02/02/2023.    12-14 Month ICM trend:     Karie Soda, RN 02/04/2023 3:36 PM

## 2023-02-04 NOTE — Telephone Encounter (Signed)
Advanced Heart Failure Patient Advocate Encounter  The patient was approved for a [employer] grant that will help cover the cost of Entresto, Farxiga, Coreg, Spironolactone. Total amount awarded, $10,000. Eligibility, 12/19/22 - 12/18/23.  ID [id number]  BIN [id number]  PCN [id number]  Group [id number]  Archer Asa, CPhT

## 2023-02-04 NOTE — Patient Instructions (Addendum)
RESTART Farxiga 10 mg daily.  Labs done today, your results will be available in MyChart, we will contact you for abnormal readings.  Repeat blood work in 10 days   You have been referred to Dr. Dimple Casey. His office will call you to arrange your appointment,   You have been referred to D. Pickard at [facility]. His office should call you to arrange your appointment.  Your physician recommends that you schedule a follow-up appointment in: 4 months.  If you have any questions or concerns before your next appointment please send [place] a message through [place] or call our office at [phone].    TO LEAVE A MESSAGE FOR THE NURSE SELECT OPTION 2, PLEASE LEAVE A MESSAGE INCLUDING: YOUR NAME DATE OF BIRTH CALL BACK NUMBER REASON FOR CALL**this is important as we prioritize the call backs  YOU WILL RECEIVE A CALL BACK THE SAME DAY AS LONG AS YOU CALL BEFORE 4:00 PM  At the [facility], you and your health needs are our priority. As part of our continuing mission to provide you with exceptional heart care, we have created designated Provider Care Teams. These Care Teams include your primary Cardiologist (physician) and Advanced Practice Providers (APPs- Physician Assistants and Nurse Practitioners) who all work together to provide you with the care you need, when you need it.   You may see any of the following providers on your designated Care Team at your next follow up: Dr Arvilla Meres Dr Marca Ancona Dr. Marcos Eke, NP Robbie Lis, [place] [facility] [place], [place] Brynda Peon, NP Karle Plumber, PharmD   Please be sure to bring in all your medications bottles to every appointment.    Thank you for choosing  [facility]

## 2023-02-04 NOTE — Progress Notes (Signed)
PCP: Rick Duff, PA-C EP: Dr. Johney Frame Cardiology: Dr. Allyson Sabal HF Cardiology: Dr. Shirlee Latch  72 y.o. with history of chronic atrial fibrillation and flutter with failed Maze procedure, valvular heart disease s/p mitral and aortic valve repairs, and chronic systolic CHF was referred by Dr. Johney Frame for CHF evaluation.  Patient has a long history of atrial fibrillation.  He was initially controlled by Tikosyn but it eventually became chronic.  He had significant mitral and aortic regurgitation, and in 11/19, he had mitral and aortic valve repairs with Maze and LA appendage ligation. Cath prior to valve surgery in 10/19 showed no significant coronary disease.  It appears from ECGs that he never went back into NSR.  He has had slow atrial fibrillation with rate in upper 30s-40s at times, so was never started on amiodarone.  Last echo in 4/20 showed EF 40-45% with stable aortic and mitral valve repairs.  He has developed worsening volume overload, and Lasix has been titrated up to 80 mg bid.  Most recently, he went into atypical atrial flutter with HR consistently in the 110s range.  He therefore was taken for DCCV 03/10/19.  Initially rhythm looked junctional in the 40s-50s, but he subsequently went back into rapid atypical atrial flutter with elevated rate (>100 bpm).   Echo was done in 9/20, showing EF worse at 20-25%.     He saw Dr. Johney Frame and had atrial flutter + atrial fibrillation ablation in 10/20.  Post-procedure, he was noted to be in slow atrial fibrillation (rate 40s) at time of his atrial fibrillation clinic followup.   Patient had St Jude CRT-D implantation in 2/21.  In 6/21, he had AV nodal ablation to allow CRT given recurrent atrial flutter/fibrillation. Echo in 10/21 showed EF up to 60-65%.    He was seen in atrial fibrillation clinic in 5/23 and was noted to be in atrial fibrillation since 4/23.  Thoracic impedance suggested fluid retention. He was in atrial fibrillation when I saw him in  5/23 and was set up for DCCV in 6/23. He went back into NSR at the time but is in atrial fibrillation again today.   Echo in 7/23 showed EF 50-55%, global hypokinesis, mildly dilated and mildly dysfunctional RV, s/p MV repair with no MR and mean gradient 4, s/p aortic valve repair with mild AS mean gradient 11 and mild AI, normal IVC.   Echo was done today and reviewed, EF 55%, mild LVH, mild RV enlargement with mildly decreased RV systolic function, s/p aortic valve repair with mean gradient 12, s/p mitral valve repair with mean gradient 4 mmHg, IVC normal, PASP 27 mmHg.   He returns today for followup of CHF.  Weight down 7 lbs.  He is not taking Marcelline Deist currently but is willing to restart it. Main complaint is low back pain. He also has neuropathic-type pain in his feet. No significant exertional dyspnea or chest pain.  Still with chronic joint pain, wants to see rheumatologist. No orthopnea/PND.  No palpitations. No lightheadedness.   ECG (personally reviewed): Atrial fibrillation, BiV pacing  St Jude device interrogation: >99% BiV pacing, stable thoracic impedance, he is in atrial fibrillation persistently, no VT.   Labs (8/20): K 3.9, creatinine 1.14, BNP 473, hgb 13.3 Labs (9/20): K 3.3 => 3.7, creatinine 1.35 => 1.3 Labs (12/20): K 4.1, creatinine 1.2 Labs (1/21): K 4.1, creatinine 1.39 Labs (8/21): K 4.3, creatinine 1.42, plts 136 Labs (12/21): K 3.3, creatinine 1.27 Labs (12/22): K 4.5, creatinine 1.27, hgb 14.3, pro-BNP  284 Labs (6/23): K 4.1, creatinine 1.2 Labs (7/23): K 4, creatinine 1.4, LDL 117 Labs (3/24): K 3.8, creatinine 1.2, LDL 105, hgb 14.6  PMH: 1. HTN 2. Paroxysmal atrial fibrillation/flutter: Failed Tikosyn.  Had Maze procedure 11/19 but does not appear to have ever gone back into NSR.  Had been in atrial fibrillation with bradycardia with rate in 30s-40s at times, now in atypical atrial flutter primarily with rate 100s-110s.  He failed DCCV 8/20.  No amiodarone due  to h/o bradycardia.  - Atrial fibrillation + atrial flutter ablation in 10/20.  - DCCV to NSR in 6/23 but quickly back into atrial fibrillation.  3. OSA: Uses CPAP.  4. Chronic systolic CHF: Nonischemic cardiomyopathy.  - 10/19 LHC: No coronary disease.  - Echo (4/20): EF 40-45%, mild LVH, moderate dilated RV with mildly decreased systolic function, s/p MV repair with no significant stenosis or regurgitation, s/p aortic valve repair with mild AI, mild aortic stenosis.  Dilated IVC.  - Echo (9/20): EF 20-25%, mild LVH with moderate LV dilation, s/p MV repair with mean gradient 6, s/p AoV repair with mean gradient 8. No significant MR or AI.  - St Jude CRT-D implantation in 2/21.  - AV nodal ablation 6/21.  - Echo (10/21): EF 60-65%, mild LV enlargement and LVH, moderate RV enlargement with normal function, severe LVE, s/p MV repair with no MR and mean gradient 3, s/p aortic valve repair with mean gradient 9 and no AI, ascending aorta 4.6 cm.  - Echo (7/23): EF 50-55%, global hypokinesis, mildly dilated and mildly dysfunctional RV, s/p MV repair with no MR and mean gradient 4, s/p aortic valve repair with mild AS mean gradient 11 and mild AI, normal IVC.  - Echo (7/24): EF 55%, mild LVH, mild RV enlargement with mildly decreased RV systolic function, s/p aortic valve repair with mean gradient 12, s/p mitral valve repair with mean gradient 4 mmHg, IVC normal, PASP 27 mmHg.  5. Valvular heart disease: Patient has history of MR and AI.  In 11/19, he had mitral valve repair, aortic valve repair, surgical Maze, and LA appendage excision.  6. LBBB 7. Ascending aorta aneurysm: 4.6 cm on 10/21 echo.  - CTA chest (7/22): 4.1 cm ascending aorta 8. Bilateral THRs  Social History   Socioeconomic History   Marital status: Married    Spouse name: Not on file   Number of children: Not on file   Years of education: Not on file   Highest education level: Not on file  Occupational History   Occupation:  Public affairs consultant  Tobacco Use   Smoking status: Never   Smokeless tobacco: Never  Vaping Use   Vaping status: Never Used  Substance and Sexual Activity   Alcohol use: No   Drug use: No   Sexual activity: Yes  Other Topics Concern   Not on file  Social History Narrative   Pt lives in [place] with spouse.  Leadership training and behavioral safety training.   Social Determinants of Health   Financial Resource Strain: Not on file  Food Insecurity: Not on file  Transportation Needs: No Transportation Needs (09/27/2019)   PRAPARE - Administrator, Civil Service (Medical): No    Lack of Transportation (Non-Medical): No  Physical Activity: Not on file  Stress: Not on file  Social Connections: Unknown (11/24/2021)   Received from [facility]   Social Network    Social Network: Not on file  Intimate Partner Violence: Unknown (  10/16/2021)   Received from [facility]   HITS    Physically Hurt: Not on file    Insult or Talk Down To: Not on file    Threaten Physical Harm: Not on file    Scream or Curse: Not on file   Family History  Problem Relation Age of Onset   Other Other        mother had a pacemaker   Hypertension Father    ROS: All systems reviewed and negative except as per HPI.   Current Outpatient Medications  Medication Sig Dispense Refill   acetaminophen (TYLENOL) 325 MG tablet Take 2 tablets (650 mg total) by mouth every 4 (four) hours as needed for headache or mild pain.     carvedilol (COREG) 6.25 MG tablet TAKE 1 TABLET (6.25 MG TOTAL) BY MOUTH 2 (TWO) TIMES DAILY WITH A MEAL. NEEDS FOLLOW UP APPOINTMENT 180 tablet 3   clobetasol ointment (TEMOVATE) 0.05 % Apply 1 application topically daily as needed (rash).     dapagliflozin propanediol (FARXIGA) 10 MG TABS tablet Take 1 tablet (10 mg total) by mouth daily before breakfast. 30 tablet 11   diclofenac Sodium (VOLTAREN) 1 % GEL Apply 2 g topically daily as needed (pain).       HYDROcodone-acetaminophen (NORCO/VICODIN) 5-325 MG tablet Take 1 tablet by mouth every 8 (eight) hours as needed for moderate pain.     Magnesium 250 MG TABS Take 2 tablets by mouth daily.     NON FORMULARY Pt uses a cpap nightly     potassium chloride SA (KLOR-CON M) 20 MEQ tablet Take 1 tablet (20 mEq total) by mouth daily. 90 tablet 3   sacubitril-valsartan (ENTRESTO) 24-26 MG TAKE 1 TABLET BY MOUTH TWICE A DAY 60 tablet 11   spironolactone (ALDACTONE) 25 MG tablet TAKE 1 TABLET BY MOUTH EVERY DAY 90 tablet 3   tadalafil (CIALIS) 10 MG tablet Take 10 mg by mouth daily as needed for erectile dysfunction.     torsemide (DEMADEX) 20 MG tablet Take 20 mg by mouth daily.     XARELTO 20 MG TABS tablet TAKE ONE TABLET BY MOUTH DAILY WITH SUPPER 30 tablet 10   No current facility-administered medications for this encounter.   BP 124/80   Pulse 61   Wt 126.2 kg (278 lb 3.2 oz)   SpO2 93%   BMI 39.92 kg/m  General: NAD Neck: No JVD, no thyromegaly or thyroid nodule.  Lungs: Clear to auscultation bilaterally with normal respiratory effort. CV: Nondisplaced PMI.  Heart regular S1/S2, no S3/S4, no murmur.  No peripheral edema.  No carotid bruit.  Normal pedal pulses.  Abdomen: Soft, nontender, no hepatosplenomegaly, no distention.  Skin: Intact without lesions or rashes.  Neurologic: Alert and oriented x 3.  Psych: Normal affect. Extremities: No clubbing or cyanosis.  HEENT: Normal.   Assessment/Plan: 1. Chronic systolic CHF: Echo in 4/20 with EF 40-45%, moderate RV dilation/mildly decreased RV function.  Nonischemic cardiomyopathy, cath in 10/19 without significant coronary disease.  Repeat echo in 9/20 showed fall in EF to 20-25% in the setting of atrial fibrillation and atrial flutter with persistent tachycardia.  He is now s/p St Jude CRT-D and AV nodal ablation.  Echo in 10/21 with EF up to 60-65%. Echo in 7/23 showed EF 50-55%, global hypokinesis, mildly dilated and mildly dysfunctional RV,  s/p MV repair with no MR and mean gradient 4, s/p aortic valve repair with mild AS mean gradient 11 and mild AI, normal IVC.  Echo in 7/24 showed EF 55%, mild LVH, mild RV enlargement with mildly decreased RV systolic function, s/p aortic valve repair with mean gradient 12, s/p mitral valve repair with mean gradient 4 mmHg, IVC normal, PASP 27 mmHg.  NYHA class I-II symptoms.  Weight is down, not volume overloaded by exam or Corvue.  - Continue torsemide 20 mg daily.  BMET/BNP today.   - Continue spironolactone 25 mg daily.   - Continue Entresto 24/26 bid.    - Continue Coreg 6.25 mg bid.  - Restart Farxiga 10 mg daily.   2. Atrial arrhythmias: Long history of chronic atrial fibrillation, has had slow ventricular response in the past (HR 40s at times). He failed Tikosyn and have avoided amiodarone with bradycardia.  He had Maze procedure in 11/19 but it was not successful.  At initial appointment with me, he was in atypical flutter with HR 110s.  He failed DCCV in 8/20.  He had flutter/fibrillation ablation in 10/20.  Initially after ablation, he was in slow atrial fibrillation (rate upper 30s-50s generally).  He is now s/p AV nodal ablation with CRT-D.  He has been in atrial fibrillation persistently.  Cardioversion was attempted in 6/23 but did not hold.  As he is BiV pacing > 99% of the time in atrial fibrillation s/p AVN ablation, I think we can hold off on trying cardioversion again.  - He will continue Xarelto.   - He asks about Watchman device.  I do not think he has a good reason to get a Watchman but he can discuss with Dr. Lalla Brothers when he sees him.  3. OSA: Continue CPAP.  4. Valvular heart disease: S/p MV and AoV repairs in 11/19.  Valves looked stable on today's echo.  - He will need antibiotic prophylaxis with dental work.  5. Ascending aortic aneurysm: 4.6 cm on 10/21 echo. CTA chest in 7/22 showed 4.1 cm ascending aorta.  6. Joint pain: Patient has pain/stiffness in multiple joints.  His  PCP told him that he thinks he has rheumatoid arthritis.  He does not have rheumatology appt.  - Will refer to [facility] rheumatology.  7. Obesity: His insurance would not cover semaglutide/tirzepatide.    Followup in 4 months with APP  Marca Ancona 02/04/2023

## 2023-02-08 DIAGNOSIS — G4733 Obstructive sleep apnea (adult) (pediatric): Secondary | ICD-10-CM | POA: Diagnosis not present

## 2023-02-17 ENCOUNTER — Ambulatory Visit (HOSPITAL_COMMUNITY)
Admission: RE | Admit: 2023-02-17 | Discharge: 2023-02-17 | Disposition: A | Discharge status: Home / Self Care | Payer: Medicare HMO | Source: Ambulatory Visit | Attending: Cardiology | Admitting: Cardiology

## 2023-02-17 DIAGNOSIS — I5022 Chronic systolic (congestive) heart failure: Secondary | ICD-10-CM

## 2023-02-17 LAB — BASIC METABOLIC PANEL
Anion gap: 8 (ref 5–15)
BUN: 29 mg/dL — ABNORMAL HIGH (ref 8–23)
CO2: 25 mmol/L (ref 22–32)
Calcium: 9.3 mg/dL (ref 8.9–10.3)
Chloride: 104 mmol/L (ref 98–111)
Creatinine, Ser: 1.24 mg/dL (ref 0.61–1.24)
GFR, Estimated: >60 mL/min (ref >60)
Glucose, Bld: 113 mg/dL — ABNORMAL HIGH (ref 70–99)
Potassium: 4.2 mmol/L (ref 3.5–5.1)
Sodium: 137 mmol/L (ref 135–145)

## 2023-02-20 ENCOUNTER — Encounter: Payer: Self-pay | Admitting: Cardiology

## 2023-03-09 ENCOUNTER — Ambulatory Visit: Payer: Medicare HMO

## 2023-03-09 DIAGNOSIS — I5022 Chronic systolic (congestive) heart failure: Secondary | ICD-10-CM

## 2023-03-09 DIAGNOSIS — Z95810 Presence of automatic (implantable) cardiac defibrillator: Secondary | ICD-10-CM | POA: Diagnosis not present

## 2023-03-11 DIAGNOSIS — G4733 Obstructive sleep apnea (adult) (pediatric): Secondary | ICD-10-CM | POA: Diagnosis not present

## 2023-03-12 NOTE — Progress Notes (Signed)
EPIC Encounter for ICM Monitoring  Patient Name: [name] is a 72 y.o. male Date: 03/12/2023 Primary Care Physican: Exie Parody Primary Cardiologist: Shirlee Latch Electrophysiologist: Townsend Roger Pacing: >99%        12/02/2021 Office Weight: 267 lbs 12/10/2021 Office Weight: 276 lbs 12/17/2021 Weight:  Has not weighed since last OV 09/12/2022 Office Weight: 285 lbs 02/04/2023 Weight: 278 lbs                                                          AT/AF Burden: 100% (taking Xarelto)    Spoke with patient and heart failure questions reviewed.  Transmission results reviewed.  Pt asymptomatic for fluid accumulation.  Reports feeling well at this time and voices no complaints.     CorVue thoracic impedance suggesting normal fluid levels within the last month.     Prescribed: Torsemide 20 mg take 1 tablet (20 mg total) daily.   Klor Con 20 mEq take take 1 tablet daily  Spironolactone 25 mg take 1 tablet daily   Labs: 02/17/2023 Creatinine 1.24, BUN 29, Potassium 4.2, Sodium 137, GFR >60 02/04/2023 Creatinine 1.30, BUN 30, Potassium 4.1, Sodium 132, GFR 59 09/29/2022 Creatinine 1.20, BUN 39, Potassium 3.8, Sodium 137, GFR >60  09/12/2022 Creatinine 1.19, BUN 28, Potassium 4.3, Sodium 138, GFR >60  01/17/2022 Creatinine 1.40, BUN 34, Potassium 4.0, Sodium 138, GFR 54 12/10/2021 Creatinine 1.33, BUN 29, Potassium 4.3, Sodium 139, GFR 58 A complete set of results can be found in Results Review.   Recommendations:  No changes and encouraged to call if experiencing any fluid symptoms.   Follow-up plan: ICM clinic phone appointment on 04/20/2023.   91 day device clinic remote transmission 03/17/2023.     EP/Cardiology Office Visits:  Recall 08/06/2023 with Dr Lalla Brothers. 05/19/2023 with HF Clinic.     Copy of ICM check sent to Dr. Lalla Brothers.     3 month ICM trend: 03/09/2023.    12-14 Month ICM trend:     Karie Soda, RN 03/12/2023 9:23 AM

## 2023-03-17 ENCOUNTER — Ambulatory Visit (INDEPENDENT_AMBULATORY_CARE_PROVIDER_SITE_OTHER): Payer: Medicare HMO

## 2023-03-17 DIAGNOSIS — I5032 Chronic diastolic (congestive) heart failure: Secondary | ICD-10-CM

## 2023-03-17 DIAGNOSIS — I428 Other cardiomyopathies: Secondary | ICD-10-CM

## 2023-03-18 ENCOUNTER — Telehealth: Payer: Self-pay

## 2023-03-18 LAB — CUP PACEART REMOTE DEVICE CHECK
Battery Remaining Longevity: 46 mo
Battery Remaining Percentage: 50 %
Battery Voltage: 2.95 V
Brady Statistic AP VP Percent: 0 %
Brady Statistic AP VS Percent: 0 %
Brady Statistic AS VP Percent: 38 %
Brady Statistic AS VS Percent: 62 %
Brady Statistic RA Percent Paced: 1 %
Date Time Interrogation Session: 20240904094419
HighPow Impedance: 83 Ohm
Implantable Lead Connection Status: 753985
Implantable Lead Connection Status: 753985
Implantable Lead Connection Status: 753985
Implantable Lead Implant Date: 20210209
Implantable Lead Implant Date: 20210209
Implantable Lead Implant Date: 20210209
Implantable Lead Location: 753858
Implantable Lead Location: 753859
Implantable Lead Location: 753860
Implantable Pulse Generator Implant Date: 20210209
Lead Channel Impedance Value: 390 Ohm
Lead Channel Impedance Value: 460 Ohm
Lead Channel Impedance Value: 860 Ohm
Lead Channel Pacing Threshold Amplitude: 0.75 V
Lead Channel Pacing Threshold Amplitude: 0.75 V
Lead Channel Pacing Threshold Amplitude: 1.5 V
Lead Channel Pacing Threshold Pulse Width: 0.5 ms
Lead Channel Pacing Threshold Pulse Width: 0.5 ms
Lead Channel Pacing Threshold Pulse Width: 0.5 ms
Lead Channel Sensing Intrinsic Amplitude: 1.6 mV
Lead Channel Sensing Intrinsic Amplitude: 9.6 mV
Lead Channel Setting Pacing Amplitude: 2 V
Lead Channel Setting Pacing Amplitude: 2.5 V
Lead Channel Setting Pacing Amplitude: 2.5 V
Lead Channel Setting Pacing Pulse Width: 0.5 ms
Lead Channel Setting Pacing Pulse Width: 0.5 ms
Lead Channel Setting Sensing Sensitivity: 0.5 mV
Pulse Gen Serial Number: 111006442
Zone Setting Status: 755011

## 2023-03-18 NOTE — Telephone Encounter (Signed)
Following alert received from [employer] received for 9 noise reversion events, EGM's with non-physiologic noise on the V lead. Events occurred 8/27, 8/28 x2, 8/30, and 9/1x5.  Called patient who is unable to recall working near [employer] or machinery during these days.   Contact Bryan Small with [employer] to review further.

## 2023-03-18 NOTE — Telephone Encounter (Signed)
Patient reports putting his phone in his front shirt pocket right over device. Paient advised not to do that and we will continue to monitor. If episodes continue, patient will need to have device clinic apt made and the following changes programmed below per [facility] and assess further. Pt updated to plan and appreciative of call.

## 2023-03-18 NOTE — Telephone Encounter (Signed)
Per Roxy Cedar, noise is clearly EMI. Also under parameters device is set to change program from DDDR to VOO. Possibly causing AV loss of synchrony.   Recommend changes per [facility] Program on [employer]. Program noise reversion from VOO to DOO. Program off t-wave attenuation filter.   Patient is unable to recall any things he has been around during these times. Will bring patient in for device clinic changes and continue to assess further alerts.

## 2023-03-23 ENCOUNTER — Encounter: Payer: Self-pay | Admitting: Cardiology

## 2023-03-23 NOTE — Telephone Encounter (Signed)
Spoke to patient, advised his device is compatible with MRI. Pt appreciative of call.

## 2023-03-24 ENCOUNTER — Telehealth: Payer: Self-pay

## 2023-03-24 NOTE — Telephone Encounter (Signed)
Remote interrogation reviewed. Noise reversion episodes noted. EMI on the RV lead. Device clinic has already reached out to the patient who reports putting phone in pocket over his device.   Device clinic, please check another remote interrogation this week and again the following week to make sure no additional noise reversion/EMI episodes. If there are any episodes, he will need clinic appointment with me to discuss management options.   Sheria Lang T. Lalla Brothers, MD, [facility], [facility]  Cardiac Electrophysiology   MyChart message sent to Pt requesting manual transmission.

## 2023-03-25 NOTE — Telephone Encounter (Signed)
Contacted Pt via MyChart.  Pt sent remote transmission.  No RV noise since 03/15/2023.  Pt will send an additional transmission 04/01/2023 for continued monitoring.

## 2023-03-25 NOTE — Progress Notes (Signed)
Remote ICD transmission.   

## 2023-03-30 ENCOUNTER — Ambulatory Visit (HOSPITAL_COMMUNITY)
Admission: RE | Admit: 2023-03-30 | Discharge: 2023-03-30 | Disposition: A | Discharge status: Home / Self Care | Payer: Medicare HMO | Source: Ambulatory Visit | Attending: Specialist | Admitting: Specialist

## 2023-03-30 DIAGNOSIS — M47816 Spondylosis without myelopathy or radiculopathy, lumbar region: Secondary | ICD-10-CM | POA: Diagnosis not present

## 2023-03-30 DIAGNOSIS — M5459 Other low back pain: Secondary | ICD-10-CM | POA: Insufficient documentation

## 2023-03-30 DIAGNOSIS — M48061 Spinal stenosis, lumbar region without neurogenic claudication: Secondary | ICD-10-CM | POA: Diagnosis not present

## 2023-03-30 DIAGNOSIS — M5136 Other intervertebral disc degeneration, lumbar region: Secondary | ICD-10-CM | POA: Diagnosis not present

## 2023-03-30 DIAGNOSIS — M5137 Other intervertebral disc degeneration, lumbosacral region: Secondary | ICD-10-CM | POA: Diagnosis not present

## 2023-04-06 NOTE — Telephone Encounter (Signed)
No further noise noted on repeat checks 03/24/2023 and 04/01/2023.

## 2023-04-09 ENCOUNTER — Other Ambulatory Visit: Payer: Self-pay

## 2023-04-09 ENCOUNTER — Emergency Department (HOSPITAL_COMMUNITY)
Admission: EM | Admit: 2023-04-09 | Discharge: 2023-04-09 | Disposition: A | Discharge status: Home / Self Care | Payer: Medicare HMO | Attending: Emergency Medicine | Admitting: Emergency Medicine

## 2023-04-09 ENCOUNTER — Emergency Department (HOSPITAL_COMMUNITY): Payer: Medicare HMO

## 2023-04-09 DIAGNOSIS — M7732 Calcaneal spur, left foot: Secondary | ICD-10-CM | POA: Diagnosis not present

## 2023-04-09 DIAGNOSIS — L03116 Cellulitis of left lower limb: Secondary | ICD-10-CM | POA: Diagnosis not present

## 2023-04-09 DIAGNOSIS — Z743 Need for continuous supervision: Secondary | ICD-10-CM | POA: Diagnosis not present

## 2023-04-09 DIAGNOSIS — Z7901 Long term (current) use of anticoagulants: Secondary | ICD-10-CM | POA: Insufficient documentation

## 2023-04-09 DIAGNOSIS — M19072 Primary osteoarthritis, left ankle and foot: Secondary | ICD-10-CM | POA: Diagnosis not present

## 2023-04-09 DIAGNOSIS — R509 Fever, unspecified: Secondary | ICD-10-CM | POA: Diagnosis not present

## 2023-04-09 DIAGNOSIS — D72829 Elevated white blood cell count, unspecified: Secondary | ICD-10-CM | POA: Diagnosis not present

## 2023-04-09 DIAGNOSIS — M79605 Pain in left leg: Secondary | ICD-10-CM | POA: Diagnosis not present

## 2023-04-09 DIAGNOSIS — R600 Localized edema: Secondary | ICD-10-CM | POA: Diagnosis not present

## 2023-04-09 DIAGNOSIS — R5381 Other malaise: Secondary | ICD-10-CM | POA: Diagnosis not present

## 2023-04-09 DIAGNOSIS — M7989 Other specified soft tissue disorders: Secondary | ICD-10-CM | POA: Diagnosis not present

## 2023-04-09 DIAGNOSIS — L0390 Cellulitis, unspecified: Secondary | ICD-10-CM

## 2023-04-09 DIAGNOSIS — R0902 Hypoxemia: Secondary | ICD-10-CM | POA: Diagnosis not present

## 2023-04-09 DIAGNOSIS — I959 Hypotension, unspecified: Secondary | ICD-10-CM | POA: Diagnosis not present

## 2023-04-09 LAB — SEDIMENTATION RATE: Sed Rate: 3 mm/hr (ref 0–16)

## 2023-04-09 LAB — CBC WITH DIFFERENTIAL/PLATELET
Abs Immature Granulocytes: 0.08 10*3/uL — ABNORMAL HIGH (ref 0.00–0.07)
Basophils Absolute: 0 10*3/uL (ref 0.0–0.1)
Basophils Relative: 0 %
Eosinophils Absolute: 0.1 10*3/uL (ref 0.0–0.5)
Eosinophils Relative: 0 %
HCT: 51.1 % (ref 39.0–52.0)
Hemoglobin: 16.2 g/dL (ref 13.0–17.0)
Immature Granulocytes: 1 %
Lymphocytes Relative: 12 %
Lymphs Abs: 1.6 10*3/uL (ref 0.7–4.0)
MCH: 28.6 pg (ref 26.0–34.0)
MCHC: 31.7 g/dL (ref 30.0–36.0)
MCV: 90.1 fL (ref 80.0–100.0)
Monocytes Absolute: 1.4 10*3/uL — ABNORMAL HIGH (ref 0.1–1.0)
Monocytes Relative: 10 %
Neutro Abs: 10.9 10*3/uL — ABNORMAL HIGH (ref 1.7–7.7)
Neutrophils Relative %: 77 %
Platelets: 166 10*3/uL (ref 150–400)
RBC: 5.67 MIL/uL (ref 4.22–5.81)
RDW: 13.3 % (ref 11.5–15.5)
WBC: 14.1 10*3/uL — ABNORMAL HIGH (ref 4.0–10.5)
nRBC: 0 % (ref 0.0–0.2)

## 2023-04-09 LAB — C-REACTIVE PROTEIN: CRP: 5.2 mg/dL — ABNORMAL HIGH (ref <1.0)

## 2023-04-09 LAB — BASIC METABOLIC PANEL
Anion gap: 11 (ref 5–15)
BUN: 31 mg/dL — ABNORMAL HIGH (ref 8–23)
CO2: 21 mmol/L — ABNORMAL LOW (ref 22–32)
Calcium: 9.6 mg/dL (ref 8.9–10.3)
Chloride: 103 mmol/L (ref 98–111)
Creatinine, Ser: 1.38 mg/dL — ABNORMAL HIGH (ref 0.61–1.24)
GFR, Estimated: 55 mL/min — ABNORMAL LOW (ref >60)
Glucose, Bld: 120 mg/dL — ABNORMAL HIGH (ref 70–99)
Potassium: 4.7 mmol/L (ref 3.5–5.1)
Sodium: 135 mmol/L (ref 135–145)

## 2023-04-09 MED ORDER — HYDROCODONE-ACETAMINOPHEN 5-325 MG PO TABS
1.0000 | ORAL_TABLET | ORAL | Status: DC | PRN
  Administered 2023-04-09: 1 via ORAL
  Filled 2023-04-09: qty 1

## 2023-04-09 MED ORDER — HYDROCODONE-ACETAMINOPHEN 5-325 MG PO TABS
2.0000 | ORAL_TABLET | Freq: Three times a day (TID) | ORAL | 0 refills | Status: AC | PRN
Start: 2023-04-09 — End: ?

## 2023-04-09 MED ORDER — DEXTROSE 5 % IV SOLN
1500.0000 mg | Freq: Once | INTRAVENOUS | Status: AC
  Administered 2023-04-09: 1500 mg via INTRAVENOUS
  Filled 2023-04-09: qty 75

## 2023-04-09 NOTE — Progress Notes (Signed)
Pharmacy Note: dalbavancin (DALVANCE) for Acute Bacterial Skin and Skin Structure Infection (ABSSSI) Patients to [facility] Discharge   [name] is a 72 y.o. male who presented to [facility] on 04/09/2023 with an ABSSSI.  Inclusion criteria:  Indication: Cellulitis  Patient has at least one SIRS criteria present: WBC > 12,000 or < 4,000   Exclusion criteria: Patient was evaluated and negative for hardware involvement, hypotension / shock, elevated lactate > 2 without other explanation, gram-negative infection risk factors (bites, water exposure, infection after trauma, infection after skin graft, burns, severe immunocompromise), necrotizing fasciitis possible / confirmed, known or suspected osteomyelitis or septic arthritis, endocarditis, diabetic foot infection, ischemic ulcers, post-operative wound infection, perirectal infection, need for drainage in the OR, hand / facial infections, injection drug users with  fever, bacteremia, pregnancy or breastfeeding, allergy related to antibiotics like vancomycin, known liver disease ( T.Bili > 2x ULN or AST/ALT > 3x ULN).  Tad Moore 04/09/2023, 2:31 PM Clinical Pharmacist

## 2023-04-09 NOTE — ED Notes (Signed)
Called pharmacy regarding IVPB med; they said it will be here shortly

## 2023-04-09 NOTE — Discharge Instructions (Addendum)
You were seen in the ER for foot and ankle swelling and pain.  Our suspicion is that most likely have cellulitis.  You have received IV Dalvance in the ER.  You will not need any other antibiotics.  Return to the ER if your symptoms are not better in 3 days.  Return to the ER sooner if your symptoms are getting worse.

## 2023-04-09 NOTE — ED Provider Notes (Signed)
EMERGENCY DEPARTMENT AT [facility] Provider Note   CSN: [id number] Arrival date & time: 04/09/23  0935     History  Chief Complaint  Patient presents with   Leg Pain    [name] is a 72 y.o. male.  HPI    72 year old male comes in with chief complaint of leg pain. Patient indicates that his left leg has been bothering him for about a week.  He was initially limping, but over the last 2 days his symptoms have significantly worsened.  He is unable to bear weight now.  He has also noted slight swelling over his leg, particularly on the outside of the ankle.  Pain is primarily over the foot and ankle area.  Patient denies any fevers, chills.  No history of DVT, PE.  Patient is on Xarelto for A-fib.  Home Medications Prior to Admission medications   Medication Sig Start Date End Date Taking? Authorizing Provider  acetaminophen (TYLENOL) 325 MG tablet Take 2 tablets (650 mg total) by mouth every 4 (four) hours as needed for headache or mild pain. 12/21/19   Graciella Freer, PA-C  carvedilol (COREG) 6.25 MG tablet TAKE 1 TABLET (6.25 MG TOTAL) BY MOUTH 2 (TWO) TIMES DAILY WITH A MEAL. NEEDS FOLLOW UP APPOINTMENT 11/03/22   Laurey Morale, MD  clobetasol ointment (TEMOVATE) 0.05 % Apply 1 application topically daily as needed (rash).    [provider]  dapagliflozin propanediol (FARXIGA) 10 MG TABS tablet Take 1 tablet (10 mg total) by mouth daily before breakfast. 02/04/23   Laurey Morale, MD  diclofenac Sodium (VOLTAREN) 1 % GEL Apply 2 g topically daily as needed (pain).  12/14/19   [provider]  HYDROcodone-acetaminophen (NORCO/VICODIN) 5-325 MG tablet Take 1 tablet by mouth every 8 (eight) hours as needed for moderate pain. 09/04/20   [provider]  Magnesium 250 MG TABS Take 2 tablets by mouth daily.    [provider]  NON FORMULARY Pt uses a cpap nightly    [provider]  potassium chloride SA  (KLOR-CON M) 20 MEQ tablet Take 1 tablet (20 mEq total) by mouth daily. 10/02/22   Laurey Morale, MD  sacubitril-valsartan (ENTRESTO) 24-26 MG TAKE 1 TABLET BY MOUTH TWICE A DAY 11/03/22   Laurey Morale, MD  spironolactone (ALDACTONE) 25 MG tablet TAKE 1 TABLET BY MOUTH EVERY DAY 10/27/22   Laurey Morale, MD  tadalafil (CIALIS) 10 MG tablet Take 10 mg by mouth daily as needed for erectile dysfunction. 10/16/20   [provider]  torsemide (DEMADEX) 20 MG tablet Take 20 mg by mouth daily.    [provider]  XARELTO 20 MG TABS tablet TAKE ONE TABLET BY MOUTH DAILY WITH SUPPER 07/03/22   Laurey Morale, MD      Allergies    Metoprolol tartrate and Oxycodone    Review of Systems   Review of Systems  All other systems reviewed and are negative.   Physical Exam Updated Vital Signs BP (!) 102/54   Pulse 65   Temp 98 F (36.7 C) (Oral)   Resp 10   Ht 5\' 10"  (1.778 m)   Wt 124.3 kg   SpO2 91%   BMI 39.31 kg/m  Physical Exam Vitals and nursing note reviewed.  Constitutional:      Appearance: He is well-developed.  HENT:     Head: Atraumatic.  Cardiovascular:     Rate and Rhythm: Normal rate.  Pulmonary:     Effort: Pulmonary effort is normal.  Musculoskeletal:     Cervical back: Neck supple.  Skin:    General: Skin is warm.     Findings: Erythema present.     Comments: Patient has erythema and tenderness to palpation over the left foot and ankle.  Lateral aspect of the ankle involved.  Patient does have discomfort with plantar and dorsiflexion of the ankle  Neurological:     Mental Status: He is alert and oriented to person, place, and time.          ED Results / Procedures / Treatments   Labs (all labs ordered are listed, but only abnormal results are displayed) Labs Reviewed  BASIC METABOLIC PANEL - Abnormal; Notable for the following components:      Result Value   CO2 21 (*)    Glucose, Bld 120 (*)    BUN 31 (*)    Creatinine, Ser 1.38  (*)    GFR, Estimated 55 (*)    All other components within normal limits  CBC WITH DIFFERENTIAL/PLATELET - Abnormal; Notable for the following components:   WBC 14.1 (*)    Neutro Abs 10.9 (*)    Monocytes Absolute 1.4 (*)    Abs Immature Granulocytes 0.08 (*)    All other components within normal limits  SEDIMENTATION RATE  C-REACTIVE PROTEIN    EKG None  Radiology US Venous Img Lower Unilateral Left  Result Date: 04/09/2023 CLINICAL DATA:  Left lower extremity pain and edema EXAM: LEFT LOWER EXTREMITY VENOUS DOPPLER ULTRASOUND TECHNIQUE: Gray-scale sonography with compression, as well as color and duplex ultrasound, were performed to evaluate the deep venous system(s) from the level of the common femoral vein through the popliteal and proximal calf veins. COMPARISON:  None Available. FINDINGS: VENOUS Normal compressibility of the common femoral, superficial femoral, and popliteal veins, as well as the visualized calf veins. Visualized portions of profunda femoral vein and great saphenous vein unremarkable. No filling defects to suggest DVT on grayscale or color Doppler imaging. Doppler waveforms show normal direction of venous flow, normal respiratory plasticity and response to augmentation. Limited views of the contralateral common femoral vein are unremarkable. OTHER None. Limitations: none IMPRESSION: Negative. Electronically Signed   By: Malachy Moan M.D.   On: 04/09/2023 12:43    Procedures Procedures    Medications Ordered in ED Medications  dalbavancin (DALVANCE) 1,500 mg in dextrose 5 % 500 mL IVPB (has no administration in time range)    ED Course/ Medical Decision Making/ A&P                                 Medical Decision Making Amount and/or Complexity of Data Reviewed Labs: ordered. Radiology: ordered.   72 year old male comes in with chief complaint of leg pain. He has history of A-fib and is on Xarelto.  Differential diagnosis considered for him  includes cellulitis, superficial phlebitis, thrombophlebitis, DVT, septic arthritis, gouty arthritis.  Ultrasound DVT ordered.  X-ray of the foot ordered.  Reassessment: Patient has leukocytosis.  He also has ultrasound that is negative for clot.  I independently interpreted patient's labs and have also independently interpreted patient's x-ray that reveals no significant effusion.  Upon reassessment, I gave patient the option of being admitted to the hospital and having orthopedics see him if he is not getting better versus wait and watch approach with IV Dalvance now.  Patient prefers the latter.  He understands that he will have to return to the ER if his symptoms are not improving over the next 2 days.  On my reassessment, patient continues to have discomfort with plantar dorsi flexion, but he indicates specifically that the lateral aspect of the ankle, where there is erythema is the only area affected and the medial aspect is fine.  That lowers the suspicion for septic arthritis or gouty arthritis.  Final Clinical Impression(s) / ED Diagnoses Final diagnoses:  Cellulitis of left lower extremity    Rx / DC Orders ED Discharge Orders          Ordered    Ambulatory referral to Infectious Disease       Comments: Cellulitis patient:  Received dalbavancin on 04/09/2023.   04/09/23 1441              Derwood Kaplan, MD 04/09/23 1457

## 2023-04-09 NOTE — ED Triage Notes (Signed)
Pt. BIB [facility] complaining of left lower leg pain to the point he couldn't put weight on it. Left foot is slightly swollen and hot to touch, with some redness. Pt. Layla Maw and "has been on it for years." Has been having issues with back and pain radiating down left leg to foot and has been seeing an orthopedic doctor for that also.

## 2023-04-13 ENCOUNTER — Encounter: Payer: Self-pay | Admitting: Family Medicine

## 2023-04-13 ENCOUNTER — Ambulatory Visit (INDEPENDENT_AMBULATORY_CARE_PROVIDER_SITE_OTHER): Payer: Medicare HMO | Admitting: Family Medicine

## 2023-04-13 VITALS — BP 126/68 | HR 66 | Temp 98.4°F | Ht 70.0 in | Wt 274.0 lb

## 2023-04-13 DIAGNOSIS — I4819 Other persistent atrial fibrillation: Secondary | ICD-10-CM

## 2023-04-13 DIAGNOSIS — I5022 Chronic systolic (congestive) heart failure: Secondary | ICD-10-CM | POA: Diagnosis not present

## 2023-04-13 DIAGNOSIS — Z9889 Other specified postprocedural states: Secondary | ICD-10-CM

## 2023-04-13 DIAGNOSIS — M2550 Pain in unspecified joint: Secondary | ICD-10-CM | POA: Diagnosis not present

## 2023-04-13 DIAGNOSIS — I10 Essential (primary) hypertension: Secondary | ICD-10-CM

## 2023-04-13 DIAGNOSIS — M25572 Pain in left ankle and joints of left foot: Secondary | ICD-10-CM | POA: Diagnosis not present

## 2023-04-13 MED ORDER — COLCHICINE 0.6 MG PO TABS
0.6000 mg | ORAL_TABLET | Freq: Every day | ORAL | 1 refills | Status: DC
Start: 2023-04-13 — End: 2023-05-04

## 2023-04-13 MED ORDER — HYDROCODONE-ACETAMINOPHEN 5-325 MG PO TABS: 2.0000 | ORAL_TABLET | Freq: Three times a day (TID) | ORAL | 0 refills | Status: DC | PRN

## 2023-04-13 MED ORDER — DOXYCYCLINE HYCLATE 100 MG PO TABS: 100.0000 mg | ORAL_TABLET | Freq: Two times a day (BID) | ORAL | 0 refills | Status: DC

## 2023-04-13 NOTE — Progress Notes (Signed)
Subjective:    Patient ID: [name], male    DOB: March 29, 1951, 72 y.o.   MRN: [id number]  HPI Patient is a very pleasant 72 year old Caucasian gentleman here today to establish care.  However he was also seen in the hospital on September 26 for cellulitis around the left ankle.  The patient states that he has been battling pain in his left ankle for 3 weeks.  The pain was so severe on the 26 that he could not even walk.  He had to go to the hospital.  In the hospital they did x-rays that were unremarkable to the left ankle did show mild arctic changes.  A CT was significant for a white cell count 14.  He was given Dalvance in the hospital.  The pain improved slightly over the next day and then returned today where he is unable to put weight on his ankle.  He is having trouble sleeping at night.  The left ankle is mildly swollen.  There is slight erythema.  However his skin is exquisitely tender to touch.  The presentation almost appears like gout.  The pain is far out of proportion to the exam.  There is no warmth. Past medical history is significant for congestive heart failure status post defibrillator, atrial fibrillation status post Maze procedure, hypertension, hyperlipidemia, and bowel care, mitral valve repair.  He is also been dealing with low back pain and right-sided sciatica since May.  MRI is pending but per my read appears to show a bulging disc between L2 and L3 impinging upon the spinal cord Past Medical History:  Diagnosis Date   AICD (automatic cardioverter/defibrillator) present    Aortic insufficiency 04/21/2018   Aortic insufficiency    Arthritis    "joints" (09/26/2013)   Atrial enlargement, left    CHF (congestive heart failure) (HCC)    Essential hypertension 11/28/2020   Hearing aid worn    both ears   Mitral regurgitation 04/21/2018   Obesity    OSA on CPAP    "mask adjusts to what I need" (09/26/2013)   Persistent atrial fibrillation (HCC)    cardioversion  06/06/13   PONV (postoperative nausea and vomiting)    Rotator cuff tear, right dec 2012   physical therapy done, decreased strength   S/P aortic valve repair 05/18/2018   Complex valvuloplasty including plication of right coronary leaflet and 21 mm Biostable HAART annuloplasty ring   S/P Maze operation for atrial fibrillation 05/18/2018   Complete bilateral atrial lesion set using bipolar radiofrequency and cryothermy ablation with clipping of LA appendage   S/P mitral valve repair 05/18/2018   Complex valvuloplasty including artificial Gore-tex neochord placement x4 and 30 mm Sorin Memo 4D ring annuloplasty   Wears glasses    Past Surgical History:  Procedure Laterality Date   AORTIC VALVE REPAIR N/A 05/18/2018   Procedure: AORTIC VALVE REPAIR using HAART 300 Aortic Annuloplasty Device size 21mm;  Surgeon: Purcell Nails, MD;  Location: [facility] OR;  Service: Open Heart Surgery;  Laterality: N/A;   ATRIAL FIBRILLATION ABLATION N/A 04/26/2019   Procedure: ATRIAL FIBRILLATION ABLATION;  Surgeon: Hillis Range, MD;  Location: [facility] INVASIVE CV LAB;  Service: Cardiovascular;  Laterality: N/A;   AV NODE ABLATION N/A 12/20/2019   Procedure: AV NODE ABLATION;  Surgeon: Hillis Range, MD;  Location: [facility] INVASIVE CV LAB;  Service: Cardiovascular;  Laterality: N/A;   BIV ICD INSERTION CRT-D N/A 08/23/2019   Procedure: BIV ICD INSERTION CRT-D;  Surgeon: Hillis Range, MD;  Location: [facility] INVASIVE CV LAB;  Service: Cardiovascular;  Laterality: N/A;   CARDIOVERSION N/A 06/06/2013   Procedure: CARDIOVERSION;  Surgeon: Thurmon Fair, MD;  Location: [facility] ENDOSCOPY;  Service: Cardiovascular;  Laterality: N/A;   CARDIOVERSION N/A 09/28/2013   Procedure: CARDIOVERSION BEDSIDE;  Surgeon: Peter M Swaziland, MD;  Location: [facility] OR;  Service: Cardiovascular;  Laterality: N/A;   CARDIOVERSION N/A 11/30/2018   Procedure: CARDIOVERSION;  Surgeon: Lars Masson, MD;  Location: [facility] ENDOSCOPY;  Service: Cardiovascular;  Laterality: N/A;    CARDIOVERSION N/A 03/10/2019   Procedure: CARDIOVERSION;  Surgeon: Little Ishikawa, MD;  Location: [facility] ENDOSCOPY;  Service: Endoscopy;  Laterality: N/A;   CARDIOVERSION N/A 12/20/2021   Procedure: CARDIOVERSION;  Surgeon: Laurey Morale, MD;  Location: [facility] ENDOSCOPY;  Service: Cardiovascular;  Laterality: N/A;   CARPAL TUNNEL RELEASE Right 08/08/2013   Procedure: RIGHT CARPAL TUNNEL RELEASE;  Surgeon: Nicki Reaper, MD;  Location: [facility];  Service: Orthopedics;  Laterality: Right;   CARPAL TUNNEL RELEASE Left 2008   CLIPPING OF ATRIAL APPENDAGE  05/18/2018   Procedure: CLIPPING OF ATRIAL APPENDAGE using AtriCure Clip size 45;  Surgeon: Purcell Nails, MD;  Location: [facility] OR;  Service: Open Heart Surgery;;   KNEE ARTHROSCOPY Right 1990's   MAZE N/A 05/18/2018   Procedure: MAZE;  Surgeon: Purcell Nails, MD;  Location: [facility] OR;  Service: Open Heart Surgery;  Laterality: N/A;   MITRAL VALVE REPAIR N/A 05/18/2018   Procedure: MITRAL VALVE REPAIR (MVR) using 4D Memo Ring size 30;  Surgeon: Purcell Nails, MD;  Location[facility] OR;  Service: Open Heart Surgery;  Laterality: N/A;   RIGHT/LEFT HEART CATH AND CORONARY ANGIOGRAPHY N/A 04/21/2018   Procedure: RIGHT/LEFT HEART CATH AND CORONARY ANGIOGRAPHY;  Surgeon: Yvonne Kendall, MD;  Location: [facility] INVASIVE CV LAB;  Service: Cardiovascular;  Laterality: N/A;   SHOULDER OPEN ROTATOR CUFF REPAIR Left 1990's   SHOULDER OPEN ROTATOR CUFF REPAIR Right 11/2007   TEE WITHOUT CARDIOVERSION N/A 04/21/2018   Procedure: TRANSESOPHAGEAL ECHOCARDIOGRAM (TEE);  Surgeon: Jake Bathe, MD;  Location: [facility] ENDOSCOPY;  Service: Cardiovascular;  Laterality: N/A;   TEE WITHOUT CARDIOVERSION N/A 05/18/2018   Procedure: TRANSESOPHAGEAL ECHOCARDIOGRAM (TEE);  Surgeon: Purcell Nails, MD;  Location: [facility] OR;  Service: Open Heart Surgery;  Laterality: N/A;   TONSILLECTOMY  1950's   TOTAL HIP ARTHROPLASTY Left 2010   TOTAL HIP ARTHROPLASTY Right 05/29/2020    Procedure: TOTAL HIP ARTHROPLASTY ANTERIOR APPROACH;  Surgeon: Sheral Apley, MD;  Location: [facility] ORS;  Service: Orthopedics;  Laterality: Right;   TOTAL HIP REVISION Left 10/08/2011   TOTAL HIP REVISION  04/09/2012   Procedure: TOTAL HIP REVISION;  Surgeon: Loanne Drilling, MD;  Location: [facility] ORS;  Service: Orthopedics;  Laterality: Left;  Left Hip Acetabular Revision vs Constrained Liner   TOTAL KNEE ARTHROPLASTY Right 2006    TRIGGER FINGER RELEASE Right 08/08/2013   Procedure: RELEASE STENOSING TENOSYNOVITIS RIGHT THUMB;  Surgeon: Nicki Reaper, MD;  Location: [facility];  Service: Orthopedics;  Laterality: Right;   Current Outpatient Medications on File Prior to Visit  Medication Sig Dispense Refill   acetaminophen (TYLENOL) 325 MG tablet Take 2 tablets (650 mg total) by mouth every 4 (four) hours as needed for headache or mild pain.     carvedilol (COREG) 6.25 MG tablet TAKE 1 TABLET (6.25 MG TOTAL) BY MOUTH 2 (TWO) TIMES DAILY WITH A MEAL. NEEDS FOLLOW UP APPOINTMENT 180 tablet 3   clobetasol  ointment (TEMOVATE) 0.05 % Apply 1 application topically daily as needed (rash).     dapagliflozin propanediol (FARXIGA) 10 MG TABS tablet Take 1 tablet (10 mg total) by mouth daily before breakfast. 30 tablet 11   diclofenac Sodium (VOLTAREN) 1 % GEL Apply 2 g topically daily as needed (pain).      Magnesium 250 MG TABS Take 2 tablets by mouth daily.     NON FORMULARY Pt uses a cpap nightly     potassium chloride SA (KLOR-CON M) 20 MEQ tablet Take 1 tablet (20 mEq total) by mouth daily. 90 tablet 3   sacubitril-valsartan (ENTRESTO) 24-26 MG TAKE 1 TABLET BY MOUTH TWICE A DAY 60 tablet 11   spironolactone (ALDACTONE) 25 MG tablet TAKE 1 TABLET BY MOUTH EVERY DAY 90 tablet 3   tadalafil (CIALIS) 10 MG tablet Take 10 mg by mouth daily as needed for erectile dysfunction.     torsemide (DEMADEX) 20 MG tablet Take 20 mg by mouth daily.     XARELTO 20 MG TABS tablet TAKE ONE TABLET BY MOUTH  DAILY WITH SUPPER 30 tablet 10   No current facility-administered medications on file prior to visit.   Allergies  Allergen Reactions   Metoprolol Tartrate Shortness Of Breath    Shortness of breath, pressure in chest and back   Oxycodone Rash and Other (See Comments)    Can tolerate Hydrocodone   Social History   Socioeconomic History   Marital status: Married    Spouse name: Not on file   Number of children: Not on file   Years of education: Not on file   Highest education level: Not on file  Occupational History   Occupation: Public affairs consultant  Tobacco Use   Smoking status: Never   Smokeless tobacco: Never  Vaping Use   Vaping status: Never Used  Substance and Sexual Activity   Alcohol use: No   Drug use: No   Sexual activity: Yes  Other Topics Concern   Not on file  Social History Narrative   Pt lives in [place] with spouse.  Leadership training and behavioral safety training.   Social Determinants of Health   Financial Resource Strain: Not on file  Food Insecurity: Not on file  Transportation Needs: No Transportation Needs (09/27/2019)   PRAPARE - Administrator, Civil Service (Medical): No    Lack of Transportation (Non-Medical): No  Physical Activity: Not on file  Stress: Not on file  Social Connections: Unknown (11/24/2021)   [facility]   Social Network    Social Network: Not on file  Intimate Partner Violence: Unknown (10/16/2021)   Received from [facility], [facility]   HITS    Physically Hurt: Not on file    Insult or Talk Down To: Not on file    Threaten Physical Harm: Not on file    Scream or Curse: Not on file      Review of Systems  All other systems reviewed and are negative.      Objective:   Physical Exam Vitals reviewed.  Constitutional:      General: He is not in acute distress.    Appearance: Normal appearance. He is obese. He is not ill-appearing or  toxic-appearing.  Cardiovascular:     Rate and Rhythm: Normal rate and regular rhythm.     Heart sounds: Normal heart sounds.  Pulmonary:     Effort: Pulmonary effort is normal. No respiratory distress.  Breath sounds: Normal breath sounds. No stridor. No wheezing, rhonchi or rales.  Abdominal:     General: Abdomen is flat. Bowel sounds are normal.     Palpations: Abdomen is soft.  Musculoskeletal:     Right lower leg: No edema.     Left lower leg: No edema.     Left ankle: Swelling present. Tenderness present. Decreased range of motion.       Legs:  Skin:    Findings: Erythema present.  Neurological:     General: No focal deficit present.     Mental Status: He is alert and oriented to person, place, and time.     Cranial Nerves: No cranial nerve deficit.     Sensory: No sensory deficit.     Motor: No weakness.     Coordination: Coordination normal.     Gait: Gait abnormal.           Assessment & Plan:   Persistent atrial fibrillation (HCC)  S/P aortic valve repair  S/P mitral valve repair  Chronic systolic heart failure (HCC)  Essential hypertension - Plan: CBC with Differential/Platelet, COMPLETE METABOLIC PANEL WITH GFR  Acute left ankle pain - Plan: Uric acid  Polyarthralgia - Plan: Rheumatoid factor, ANA, Sedimentation rate Patient's presentation suggest autoimmune arthritis versus gout versus septic arthritis.  Obtain a CBC to evaluate his white blood cell count, check a sed rate, rheumatoid factor, and ANA.  I will also check a uric acid level.  I will start the patient on doxycycline 100 mg twice daily for 7 days given the mild improvement seen after the Dalvance however that should have been sufficient for 1 week.  If sed rate and uric acid level are high on the patient and use of Cetaphil mediately followed by 1 pill in 1 hour pain.  I will give patient with hydrocodone to help him sleep at night.  Consider a prednisone taper pack if pain persist and uric  acid level is extremely high.  Await results of lab work.  Blood pressure today is outstanding at 126/68.  Patient is currently on Entresto, spironolactone, and carvedilol along with Farxiga indicating maximum medical therapy for his congestive heart failure.  He is appropriately anticoagulated with Xarelto.  He is unable to use NSAIDs for that reason.

## 2023-04-14 ENCOUNTER — Telehealth: Payer: Self-pay | Admitting: Family Medicine

## 2023-04-14 NOTE — Telephone Encounter (Signed)
Patient left voicemail message to follow up on visit; awaiting call back to find out if lab results show gout or cellulitis.   Please advise at [phone].

## 2023-04-15 DIAGNOSIS — M5451 Vertebrogenic low back pain: Secondary | ICD-10-CM | POA: Diagnosis not present

## 2023-04-15 LAB — COMPLETE METABOLIC PANEL WITH GFR
AG Ratio: 1.7 (calc) (ref 1.0–2.5)
ALT: 9 U/L (ref 9–46)
AST: 11 U/L (ref 10–35)
Albumin: 4.2 g/dL (ref 3.6–5.1)
Alkaline phosphatase (APISO): 45 U/L (ref 35–144)
BUN/Creatinine Ratio: 21 (calc) (ref 6–22)
BUN: 27 mg/dL — ABNORMAL HIGH (ref 7–25)
CO2: 25 mmol/L (ref 20–32)
Calcium: 10.2 mg/dL (ref 8.6–10.3)
Chloride: 105 mmol/L (ref 98–110)
Creat: 1.27 mg/dL (ref 0.70–1.28)
Globulin: 2.5 g/dL (ref 1.9–3.7)
Glucose, Bld: 115 mg/dL — ABNORMAL HIGH (ref 65–99)
Potassium: 5 mmol/L (ref 3.5–5.3)
Sodium: 137 mmol/L (ref 135–146)
Total Bilirubin: 0.4 mg/dL (ref 0.2–1.2)
Total Protein: 6.7 g/dL (ref 6.1–8.1)
eGFR: 61 mL/min/{1.73_m2} (ref >=60)

## 2023-04-15 LAB — CBC WITH DIFFERENTIAL/PLATELET
Absolute Monocytes: 747 {cells}/uL (ref 200–950)
Basophils Absolute: 30 {cells}/uL (ref 0–200)
Basophils Relative: 0.4 %
Eosinophils Absolute: 200 {cells}/uL (ref 15–500)
Eosinophils Relative: 2.7 %
HCT: 45.8 % (ref 38.5–50.0)
Hemoglobin: 14.9 g/dL (ref 13.2–17.1)
Lymphs Abs: 1569 {cells}/uL (ref 850–3900)
MCH: 28.7 pg (ref 27.0–33.0)
MCHC: 32.5 g/dL (ref 32.0–36.0)
MCV: 88.1 fL (ref 80.0–100.0)
MPV: 11.6 fL (ref 7.5–12.5)
Monocytes Relative: 10.1 %
Neutro Abs: 4854 {cells}/uL (ref 1500–7800)
Neutrophils Relative %: 65.6 %
Platelets: 210 10*3/uL (ref 140–400)
RBC: 5.2 10*6/uL (ref 4.20–5.80)
RDW: 12.9 % (ref 11.0–15.0)
Total Lymphocyte: 21.2 %
WBC: 7.4 10*3/uL (ref 3.8–10.8)

## 2023-04-15 LAB — ANTI-NUCLEAR AB-TITER (ANA TITER)
ANA TITER: 1:40 {titer} — ABNORMAL HIGH
ANA Titer 1: 1:80 {titer} — ABNORMAL HIGH

## 2023-04-15 LAB — SEDIMENTATION RATE: Sed Rate: 11 mm/h (ref 0–20)

## 2023-04-15 LAB — ANA: Anti Nuclear Antibody (ANA): POSITIVE — AB

## 2023-04-15 LAB — URIC ACID: Uric Acid, Serum: 6.4 mg/dL (ref 4.0–8.0)

## 2023-04-15 LAB — RHEUMATOID FACTOR: Rheumatoid fact SerPl-aCnc: <10 [IU]/mL (ref <14)

## 2023-04-17 ENCOUNTER — Ambulatory Visit: Payer: Medicare HMO | Admitting: Family Medicine

## 2023-04-17 VITALS — BP 122/68 | HR 65 | Ht 70.0 in | Wt 277.0 lb

## 2023-04-17 DIAGNOSIS — M25572 Pain in left ankle and joints of left foot: Secondary | ICD-10-CM | POA: Diagnosis not present

## 2023-04-17 MED ORDER — PREDNISONE 20 MG PO TABS: ORAL_TABLET | ORAL | 0 refills | Status: DC

## 2023-04-17 NOTE — Progress Notes (Signed)
Subjective:    Patient ID: [name], male    DOB: [birth year], 72 y.o.   MRN: [id number]  HPI 04/13/23 Patient is a very pleasant 72 year old Caucasian gentleman here today to establish care.  However he was also seen in the hospital on September 26 for cellulitis around the left ankle.  The patient states that he has been battling pain in his left ankle for 3 weeks.  The pain was so severe on the 26 that he could not even walk.  He had to go to the hospital.  In the hospital they did x-rays that were unremarkable to the left ankle did show mild arctic changes.  A CT was significant for a white cell count 14.  He was given Dalvance in the hospital.  The pain improved slightly over the next day and then returned today where he is unable to put weight on his ankle.  He is having trouble sleeping at night.  The left ankle is mildly swollen.  There is slight erythema.  However his skin is exquisitely tender to touch.  The presentation almost appears like gout.  The pain is far out of proportion to the exam.  There is no warmth. Past medical history is significant for congestive heart failure status post defibrillator, atrial fibrillation status post Maze procedure, hypertension, hyperlipidemia, and bowel care, mitral valve repair.  He is also been dealing with low back pain and right-sided sciatica since May.  MRI is pending but per my read appears to show a bulging disc between L2 and L3 impinging upon the spinal cord.  At that time, my plan was: Patient's presentation suggest autoimmune arthritis versus gout versus septic arthritis.  Obtain a CBC to evaluate his white blood cell count, check a sed rate, rheumatoid factor, and ANA.  I will also check a uric acid level.  I will start the patient on doxycycline 100 mg twice daily for 7 days given the mild improvement seen after the Dalvance however that should have been sufficient for 1 week.  If sed rate and uric acid level are high on the patient and use  of Cetaphil mediately followed by 1 pill in 1 hour pain.  I will give patient with hydrocodone to help him sleep at night.  Consider a prednisone taper pack if pain persist and uric acid level is extremely high.  Await results of lab work.  Blood pressure today is outstanding at 126/68.  Patient is currently on Entresto, spironolactone, and carvedilol along with Farxiga indicating maximum medical therapy for his congestive heart failure.  He is appropriately anticoagulated with Xarelto.  He is unable to use NSAIDs for that reason.  Office Visit on 04/13/2023  Component Date Value Ref Range Status   WBC 04/13/2023 7.4  3.8 - 10.8 Thousand/uL Final   RBC 04/13/2023 5.20  4.20 - 5.80 Million/uL Final   Hemoglobin 04/13/2023 14.9  13.2 - 17.1 g/dL Final   HCT 19/14/7829 45.8  38.5 - 50.0 % Final   MCV 04/13/2023 88.1  80.0 - 100.0 fL Final   MCH 04/13/2023 28.7  27.0 - 33.0 pg Final   MCHC 04/13/2023 32.5  32.0 - 36.0 g/dL Final   Comment: For adults, a slight decrease in the calculated MCHC value (in the range of 30 to 32 g/dL) is most likely not clinically significant; however, it should be interpreted with caution in correlation with other red cell parameters and the patient's clinical condition.    RDW 04/13/2023 12.9  11.0 - 15.0 % Final   Platelets 04/13/2023 210  140 - 400 Thousand/uL Final   MPV 04/13/2023 11.6  7.5 - 12.5 fL Final   Neutro Abs 04/13/2023 4,854  1,500 - 7,800 cells/uL Final   Lymphs Abs 04/13/2023 1,569  850 - 3,900 cells/uL Final   Absolute Monocytes 04/13/2023 747  200 - 950 cells/uL Final   Eosinophils Absolute 04/13/2023 200  15 - 500 cells/uL Final   Basophils Absolute 04/13/2023 30  0 - 200 cells/uL Final   Neutrophils Relative % 04/13/2023 65.6  % Final   Total Lymphocyte 04/13/2023 21.2  % Final   Monocytes Relative 04/13/2023 10.1  % Final   Eosinophils Relative 04/13/2023 2.7  % Final   Basophils Relative 04/13/2023 0.4  % Final   Glucose, Bld 04/13/2023  115 (H)  65 - 99 mg/dL Final   Comment: .            Fasting reference interval . For someone without known diabetes, a glucose value between 100 and 125 mg/dL is consistent with prediabetes and should be confirmed with a follow-up test. .    BUN 04/13/2023 27 (H)  7 - 25 mg/dL Final   Creat 96/10/5407 1.27  0.70 - 1.28 mg/dL Final   eGFR 81/19/1478 61  > OR = 60 mL/min/1.51m2 Final   BUN/Creatinine Ratio 04/13/2023 21  6 - 22 (calc) Final   Sodium 04/13/2023 137  135 - 146 mmol/L Final   Potassium 04/13/2023 5.0  3.5 - 5.3 mmol/L Final   Chloride 04/13/2023 105  98 - 110 mmol/L Final   CO2 04/13/2023 25  20 - 32 mmol/L Final   Calcium 04/13/2023 10.2  8.6 - 10.3 mg/dL Final   Total Protein 29/56/2130 6.7  6.1 - 8.1 g/dL Final   Albumin 86/57/8469 4.2  3.6 - 5.1 g/dL Final   Globulin 62/95/2841 2.5  1.9 - 3.7 g/dL (calc) Final   AG Ratio 04/13/2023 1.7  1.0 - 2.5 (calc) Final   Total Bilirubin 04/13/2023 0.4  0.2 - 1.2 mg/dL Final   Alkaline phosphatase (APISO) 04/13/2023 45  35 - 144 U/L Final   AST 04/13/2023 11  10 - 35 U/L Final   ALT 04/13/2023 9  9 - 46 U/L Final   Uric Acid, Serum 04/13/2023 6.4  4.0 - 8.0 mg/dL Final   Comment: Therapeutic target for gout patients: <6.0 mg/dL .    Rheumatoid fact SerPl-aCnc 04/13/2023 <10  <14 IU/mL Final   Anti Nuclear Antibody (ANA) 04/13/2023 POSITIVE (A)  NEGATIVE Final   Comment: ANA IFA is a first line screen for detecting the presence of up to approximately 150 autoantibodies in various autoimmune diseases. A positive ANA IFA result is suggestive of autoimmune disease and reflexes to titer and pattern. Further laboratory testing may be considered if clinically indicated. . For additional information, please refer to http://education.[facility].com/faq/FAQ177 (This link is being provided for informational/ educational purposes only.) .    Sed Rate 04/13/2023 11  0 - 20 mm/h Final   ANA Titer 1 04/13/2023 1:80 (H)   titer Final   Comment: A low level ANA titer may be present in pre-clinical autoimmune diseases and normal individuals.                 Reference Range                 <1:40        Negative  1:40-1:80    Low Antibody Level                 >1:80        Elevated Antibody Level .    ANA Pattern 1 04/13/2023 Nuclear, Homogeneous (A)   Final   Comment: Homogeneous pattern is associated with systemic lupus erythematosus (SLE), drug-induced lupus and juvenile idiopathic arthritis. . AC-1: Homogeneous . International Consensus on ANA Patterns ([email or web address])    ANA TITER 04/13/2023 1:40 (H)  titer Final   Comment: A low level ANA titer may be present in pre-clinical autoimmune diseases and normal individuals.                 Reference Range                 <1:40        Negative                 1:40-1:80    Low Antibody Level                 >1:80        Elevated Antibody Level .    ANA PATTERN 04/13/2023 Cytoplasmic (A)   Final   Comment: The presence of cytoplasmic fluorescence was noted on the HEp-2 slide. Other reactivities (e.g., anti- mitochondrial antibodies or anti-smooth muscle antibodies) may be responsible for this fluorescence. The clinical significance of this finding is uncertain. Clinical correlation is recommended. . AC-15 to AC-23: Cytoplasmic . International Consensus on ANA Patterns ([email or web address])   Patient's uric acid level was 6.4.  Given that his sed rate was 11, I did not feel that this was consistent with a gout exacerbation.  Therefore I had him to stop colchicine but continue doxycycline for possible cellulitis.  His rheumatoid factor was negative.  His ANA was positive.  However again, given his normal sed rate, I felt that this was likely a false positive and not indicative of an autoimmune disease.  Patient returns today for reevaluation. 04/17/23  Redness has totally subsided.  However  the patient continues to have pain and swelling in his left ankle.  He states that it feels like there is a stabbing pain in the ankle especially at night.  He denies any fevers or chills.  He denies any signs of systemic illness.  It hurts to bear weight.  It hurts to walk. Past Medical History:  Diagnosis Date   AICD (automatic cardioverter/defibrillator) present    Aortic insufficiency 04/21/2018   Aortic insufficiency    Arthritis    "joints" (09/26/2013)   Atrial enlargement, left    CHF (congestive heart failure) (HCC)    Essential hypertension 11/28/2020   Hearing aid worn    both ears   Mitral regurgitation 04/21/2018   Obesity    OSA on CPAP    "mask adjusts to what I need" (09/26/2013)   Persistent atrial fibrillation (HCC)    cardioversion 06/06/13   PONV (postoperative nausea and vomiting)    Rotator cuff tear, right dec 2012   physical therapy done, decreased strength   S/P aortic valve repair 05/18/2018   Complex valvuloplasty including plication of right coronary leaflet and 21 mm Biostable HAART annuloplasty ring   S/P Maze operation for atrial fibrillation 05/18/2018   Complete bilateral atrial lesion set using bipolar radiofrequency and cryothermy ablation with clipping of LA appendage   S/P mitral valve repair 05/18/2018   Complex valvuloplasty including artificial Gore-tex neochord placement x4 and 30  mm Sorin Memo 4D ring annuloplasty   Wears glasses    Past Surgical History:  Procedure Laterality Date   AORTIC VALVE REPAIR N/A 05/18/2018   Procedure: AORTIC VALVE REPAIR using HAART 300 Aortic Annuloplasty Device size 21mm;  Surgeon: Purcell Nails, MD;  Location: [facility] OR;  Service: Open Heart Surgery;  Laterality: N/A;   ATRIAL FIBRILLATION ABLATION N/A 04/26/2019   Procedure: ATRIAL FIBRILLATION ABLATION;  Surgeon: Hillis Range, MD;  Location: [facility];  Service: Cardiovascular;  Laterality: N/A;   AV NODE ABLATION N/A 12/20/2019   Procedure: AV NODE  ABLATION;  Surgeon: Hillis Range, MD;  Location: [facility];  Service: Cardiovascular;  Laterality: N/A;   BIV ICD INSERTION CRT-D N/A 08/23/2019   Procedure: BIV ICD INSERTION CRT-D;  Surgeon: Hillis Range, MD;  Location: [facility];  Service: Cardiovascular;  Laterality: N/A;   CARDIOVERSION N/A 06/06/2013   Procedure: CARDIOVERSION;  Surgeon: Thurmon Fair, MD;  Location: [facility] ENDOSCOPY;  Service: Cardiovascular;  Laterality: N/A;   CARDIOVERSION N/A 09/28/2013   Procedure: CARDIOVERSION BEDSIDE;  Surgeon: Peter M Swaziland, MD;  Location: [facility] OR;  Service: Cardiovascular;  Laterality: N/A;   CARDIOVERSION N/A 11/30/2018   Procedure: CARDIOVERSION;  Surgeon: Lars Masson, MD;  Location: [facility] ENDOSCOPY;  Service: Cardiovascular;  Laterality: N/A;   CARDIOVERSION N/A 03/10/2019   Procedure: CARDIOVERSION;  Surgeon: Little Ishikawa, MD;  Location: [facility] ENDOSCOPY;  Service: Endoscopy;  Laterality: N/A;   CARDIOVERSION N/A 12/20/2021   Procedure: CARDIOVERSION;  Surgeon: Laurey Morale, MD;  Location: [facility] ENDOSCOPY;  Service: Cardiovascular;  Laterality: N/A;   CARPAL TUNNEL RELEASE Right 08/08/2013   Procedure: RIGHT CARPAL TUNNEL RELEASE;  Surgeon: Nicki Reaper, MD;  Location: [facility];  Service: Orthopedics;  Laterality: Right;   CARPAL TUNNEL RELEASE Left 2008   CLIPPING OF ATRIAL APPENDAGE  05/18/2018   Procedure: CLIPPING OF ATRIAL APPENDAGE using AtriCure Clip size 45;  Surgeon: Purcell Nails, MD;  Location: [facility] OR;  Service: Open Heart Surgery;;   KNEE ARTHROSCOPY Right 1990's   MAZE N/A 05/18/2018   Procedure: MAZE;  Surgeon: Purcell Nails, MD;  Location: [facility] OR;  Service: Open Heart Surgery;  Laterality: N/A;   MITRAL VALVE REPAIR N/A 05/18/2018   Procedure: MITRAL VALVE REPAIR (MVR) using 4D Memo Ring size 30;  Surgeon: Purcell Nails, MD;  Location: [facility] OR;  Service: Open Heart Surgery;  Laterality: N/A;   RIGHT/LEFT HEART CATH AND CORONARY  ANGIOGRAPHY N/A 04/21/2018   Procedure: RIGHT/LEFT HEART CATH AND CORONARY ANGIOGRAPHY;  Surgeon: Yvonne Kendall, MD;  Location: [facility] CV LAB;  Service: Cardiovascular;  Laterality: N/A;   SHOULDER OPEN ROTATOR CUFF REPAIR Left 1990's   SHOULDER OPEN ROTATOR CUFF REPAIR Right 11/2007   TEE WITHOUT CARDIOVERSION N/A 04/21/2018   Procedure: TRANSESOPHAGEAL ECHOCARDIOGRAM (TEE);  Surgeon: Jake Bathe, MD;  Location: [facility] ENDOSCOPY;  Service: Cardiovascular;  Laterality: N/A;   TEE WITHOUT CARDIOVERSION N/A 05/18/2018   Procedure: TRANSESOPHAGEAL ECHOCARDIOGRAM (TEE);  Surgeon: Purcell Nails, MD;  Location: [facility] OR;  Service: Open Heart Surgery;  Laterality: N/A;   TONSILLECTOMY  1950's   TOTAL HIP ARTHROPLASTY Left 2010   TOTAL HIP ARTHROPLASTY Right 05/29/2020   Procedure: TOTAL HIP ARTHROPLASTY ANTERIOR APPROACH;  Surgeon: Sheral Apley, MD;  Location: [facility] ORS;  Service: Orthopedics;  Laterality: Right;   TOTAL HIP REVISION Left 10/08/2011   TOTAL HIP REVISION  04/09/2012   Procedure: TOTAL HIP REVISION;  Surgeon: Loanne Drilling, MD;  Location: [facility] ORS;  Service: Orthopedics;  Laterality: Left;  Left Hip Acetabular Revision vs Constrained Liner   TOTAL KNEE ARTHROPLASTY Right 2006    TRIGGER FINGER RELEASE Right 08/08/2013   Procedure: RELEASE STENOSING TENOSYNOVITIS RIGHT THUMB;  Surgeon: Nicki Reaper, MD;  Location: [facility];  Service: Orthopedics;  Laterality: Right;   Current Outpatient Medications on File Prior to Visit  Medication Sig Dispense Refill   acetaminophen (TYLENOL) 325 MG tablet Take 2 tablets (650 mg total) by mouth every 4 (four) hours as needed for headache or mild pain.     carvedilol (COREG) 6.25 MG tablet TAKE 1 TABLET (6.25 MG TOTAL) BY MOUTH 2 (TWO) TIMES DAILY WITH A MEAL. NEEDS FOLLOW UP APPOINTMENT 180 tablet 3   clobetasol ointment (TEMOVATE) 0.05 % Apply 1 application topically daily as needed (rash).     colchicine 0.6 MG tablet Take 1  tablet (0.6 mg total) by mouth daily. Take 2 pills immediately and 1 pill in 1 hour if pain persists 30 tablet 1   dapagliflozin propanediol (FARXIGA) 10 MG TABS tablet Take 1 tablet (10 mg total) by mouth daily before breakfast. 30 tablet 11   diclofenac Sodium (VOLTAREN) 1 % GEL Apply 2 g topically daily as needed (pain).      doxycycline (VIBRA-TABS) 100 MG tablet Take 1 tablet (100 mg total) by mouth 2 (two) times daily. 14 tablet 0   HYDROcodone-acetaminophen (NORCO/VICODIN) 5-325 MG tablet Take 2 tablets by mouth every 8 (eight) hours as needed. 30 tablet 0   Magnesium 250 MG TABS Take 2 tablets by mouth daily.     NON FORMULARY Pt uses a cpap nightly     potassium chloride SA (KLOR-CON M) 20 MEQ tablet Take 1 tablet (20 mEq total) by mouth daily. 90 tablet 3   sacubitril-valsartan (ENTRESTO) 24-26 MG TAKE 1 TABLET BY MOUTH TWICE A DAY 60 tablet 11   spironolactone (ALDACTONE) 25 MG tablet TAKE 1 TABLET BY MOUTH EVERY DAY 90 tablet 3   tadalafil (CIALIS) 10 MG tablet Take 10 mg by mouth daily as needed for erectile dysfunction.     torsemide (DEMADEX) 20 MG tablet Take 20 mg by mouth daily.     XARELTO 20 MG TABS tablet TAKE ONE TABLET BY MOUTH DAILY WITH SUPPER 30 tablet 10   No current facility-administered medications on file prior to visit.   Allergies  Allergen Reactions   Metoprolol Tartrate Shortness Of Breath    Shortness of breath, pressure in chest and back   Oxycodone Rash and Other (See Comments)    Can tolerate Hydrocodone   Social History   Socioeconomic History   Marital status: Married    Spouse name: Not on file   Number of children: Not on file   Years of education: Not on file   Highest education level: Not on file  Occupational History   Occupation: Public affairs consultant  Tobacco Use   Smoking status: Never   Smokeless tobacco: Never  Vaping Use   Vaping status: Never Used  Substance and Sexual Activity   Alcohol use: No   Drug use:  No   Sexual activity: Yes  Other Topics Concern   Not on file  Social History Narrative   Pt lives in [place] with spouse.  Leadership training and behavioral safety training.   Social Determinants of Health   Financial Resource Strain: Not on file  Food Insecurity: Not on file  Transportation Needs: No Transportation Needs (09/27/2019)   PRAPARE - Administrator, Civil Service (Medical): No    Lack of Transportation (Non-Medical): No  Physical Activity: Not on file  Stress: Not on file  Social Connections: Unknown (11/24/2021)   [facility]   Social Network    Social Network: Not on file  Intimate Partner Violence: Unknown (10/16/2021)   [facility], [facility]   HITS    Physically Hurt: Not on file    Insult or Talk Down To: Not on file    Threaten Physical Harm: Not on file    Scream or Curse: Not on file      Review of Systems  All other systems reviewed and are negative.      Objective:   Physical Exam Vitals reviewed.  Constitutional:      General: He is not in acute distress.    Appearance: Normal appearance. He is obese. He is not ill-appearing or toxic-appearing.  Cardiovascular:     Rate and Rhythm: Normal rate and regular rhythm.     Heart sounds: Normal heart sounds.  Pulmonary:     Effort: Pulmonary effort is normal. No respiratory distress.     Breath sounds: Normal breath sounds. No stridor. No wheezing, rhonchi or rales.  Abdominal:     General: Abdomen is flat. Bowel sounds are normal.     Palpations: Abdomen is soft.  Musculoskeletal:     Right lower leg: No edema.     Left lower leg: No edema.     Left ankle: Swelling present. Tenderness present. Decreased range of motion.       Legs:  Skin:    Findings: Erythema present.  Neurological:     General: No focal deficit present.     Mental Status: He is alert and oriented to person, place, and time.     Cranial Nerves: No cranial  nerve deficit.     Sensory: No sensory deficit.     Motor: No weakness.     Coordination: Coordination normal.     Gait: Gait abnormal.           Assessment & Plan:  Acute left ankle pain Based on presentation and history I believe the patient is dealing with a gout exacerbation versus pseudogout.  I will treat the patient with prednisone taper pack.  Reassess next week.  I know his uric acid level was relatively low at 6.4 but this is not conclusive for excluding gout.  If the pain is much better next week this would prove 1 of these 2 possible causes.  There is no evidence of cellulitis or osteomyelitis.  However if the pain is persistent, I would recommend an MRI of the ankle to evaluate for stress fracture.

## 2023-04-20 ENCOUNTER — Ambulatory Visit: Payer: Medicare HMO | Attending: Cardiology

## 2023-04-20 DIAGNOSIS — I5032 Chronic diastolic (congestive) heart failure: Secondary | ICD-10-CM

## 2023-04-20 DIAGNOSIS — Z95810 Presence of automatic (implantable) cardiac defibrillator: Secondary | ICD-10-CM | POA: Diagnosis not present

## 2023-04-24 NOTE — Progress Notes (Signed)
EPIC Encounter for ICM Monitoring  Patient Name: [name] is a 72 y.o. male Date: 04/24/2023 Primary Care Physican: Donita Brooks, MD Primary Cardiologist: Shirlee Latch Electrophysiologist: Townsend Roger Pacing: >99%        12/02/2021 Office Weight: 267 lbs 12/10/2021 Office Weight: 276 lbs 12/17/2021 Weight:  Has not weighed since last OV 09/12/2022 Office Weight: 285 lbs 02/04/2023 Weight: 278 lbs                                                          AT/AF Burden: 100% (taking Xarelto)    Spoke with patient and heart failure questions reviewed.  Transmission results reviewed.  Pt asymptomatic for fluid accumulation.  He was hospitalized with severe gout and did not take diuretic for that week.   He has resumed Torsemide and steroids has resolved gout at this time.    CorVue thoracic impedance suggesting normal fluid levels with the exception of possible fluid accumulation from 9/29-10/6.   Prescribed: Torsemide 20 mg take 1 tablet (20 mg total) daily.   Klor Con 20 mEq take take 1 tablet daily  Spironolactone 25 mg take 1 tablet daily   Labs: 04/13/2023 Creatinine 1.27, BUN 27, Potassium 5.0, Sodium 137 04/09/2023 Creatinine 1.38, BUN 31, Potassium 4.7, Sodium 135, GFR 55 02/17/2023 Creatinine 1.24, BUN 29, Potassium 4.2, Sodium 137, GFR >60 02/04/2023 Creatinine 1.30, BUN 30, Potassium 4.1, Sodium 132, GFR 59 09/29/2022 Creatinine 1.20, BUN 39, Potassium 3.8, Sodium 137, GFR >60  09/12/2022 Creatinine 1.19, BUN 28, Potassium 4.3, Sodium 138, GFR >60  01/17/2022 Creatinine 1.40, BUN 34, Potassium 4.0, Sodium 138, GFR 54 12/10/2021 Creatinine 1.33, BUN 29, Potassium 4.3, Sodium 139, GFR 58 A complete set of results can be found in Results Review.   Recommendations:  No changes and encouraged to call if experiencing any fluid symptoms.   Follow-up plan: ICM clinic phone appointment on 05/25/2023.   91 day device clinic remote transmission 06/16/2023.     EP/Cardiology Office  Visits:  Recall 08/06/2023 with Dr Lalla Brothers. 05/19/2023 with [facility] Clinic.     Copy of ICM check sent to Dr. Lalla Brothers.     3 month ICM trend: 04/20/2023.    12-14 Month ICM trend:     Karie Soda, RN 04/24/2023 2:04 PM

## 2023-04-28 ENCOUNTER — Ambulatory Visit: Payer: Medicare HMO | Admitting: Family Medicine

## 2023-04-28 ENCOUNTER — Encounter: Payer: Self-pay | Admitting: Family Medicine

## 2023-04-28 VITALS — BP 120/78 | HR 78 | Ht 70.0 in | Wt 271.0 lb

## 2023-04-28 DIAGNOSIS — M25572 Pain in left ankle and joints of left foot: Secondary | ICD-10-CM | POA: Diagnosis not present

## 2023-04-28 MED ORDER — COLCHICINE 0.6 MG PO TABS: 0.6000 mg | ORAL_TABLET | Freq: Every day | ORAL | 3 refills | Status: DC

## 2023-04-28 MED ORDER — PREDNISONE 20 MG PO TABS: ORAL_TABLET | ORAL | 0 refills | Status: DC

## 2023-04-28 NOTE — Progress Notes (Signed)
Subjective:    Patient ID: [name], male    DOB: 1951/03/15, 72 y.o.   MRN: [id number]  Foot Pain   04/13/23 Patient is a very pleasant 72 year old Caucasian gentleman here today to establish care.  However he was also seen in the hospital on September 26 for cellulitis around the left ankle.  The patient states that he has been battling pain in his left ankle for 3 weeks.  The pain was so severe on the 26 that he could not even walk.  He had to go to the hospital.  In the hospital they did x-rays that were unremarkable to the left ankle did show mild arctic changes.  A CT was significant for a white cell count 14.  He was given Dalvance in the hospital.  The pain improved slightly over the next day and then returned today where he is unable to put weight on his ankle.  He is having trouble sleeping at night.  The left ankle is mildly swollen.  There is slight erythema.  However his skin is exquisitely tender to touch.  The presentation almost appears like gout.  The pain is far out of proportion to the exam.  There is no warmth. Past medical history is significant for congestive heart failure status post defibrillator, atrial fibrillation status post Maze procedure, hypertension, hyperlipidemia, and bowel care, mitral valve repair.  He is also been dealing with low back pain and right-sided sciatica since May.  MRI is pending but per my read appears to show a bulging disc between L2 and L3 impinging upon the spinal cord.  At that time, my plan was: Patient's presentation suggest autoimmune arthritis versus gout versus septic arthritis.  Obtain a CBC to evaluate his white blood cell count, check a sed rate, rheumatoid factor, and ANA.  I will also check a uric acid level.  I will start the patient on doxycycline 100 mg twice daily for 7 days given the mild improvement seen after the Dalvance however that should have been sufficient for 1 week.  If sed rate and uric acid level are high on the  patient and use of Cetaphil mediately followed by 1 pill in 1 hour pain.  I will give patient with hydrocodone to help him sleep at night.  Consider a prednisone taper pack if pain persist and uric acid level is extremely high.  Await results of lab work.  Blood pressure today is outstanding at 126/68.  Patient is currently on Entresto, spironolactone, and carvedilol along with Farxiga indicating maximum medical therapy for his congestive heart failure.  He is appropriately anticoagulated with Xarelto.  He is unable to use NSAIDs for that reason.  No visits with results within 1 Week(s) from this visit.  Latest known visit with results is:  Office Visit on 04/13/2023  Component Date Value Ref Range Status   WBC 04/13/2023 7.4  3.8 - 10.8 Thousand/uL Final   RBC 04/13/2023 5.20  4.20 - 5.80 Million/uL Final   Hemoglobin 04/13/2023 14.9  13.2 - 17.1 g/dL Final   HCT 40/98/1191 45.8  38.5 - 50.0 % Final   MCV 04/13/2023 88.1  80.0 - 100.0 fL Final   MCH 04/13/2023 28.7  27.0 - 33.0 pg Final   MCHC 04/13/2023 32.5  32.0 - 36.0 g/dL Final   Comment: For adults, a slight decrease in the calculated MCHC value (in the range of 30 to 32 g/dL) is most likely not clinically significant; however, it should be interpreted  with caution in correlation with other red cell parameters and the patient's clinical condition.    RDW 04/13/2023 12.9  11.0 - 15.0 % Final   Platelets 04/13/2023 210  140 - 400 Thousand/uL Final   MPV 04/13/2023 11.6  7.5 - 12.5 fL Final   Neutro Abs 04/13/2023 4,854  1,500 - 7,800 cells/uL Final   Lymphs Abs 04/13/2023 1,569  850 - 3,900 cells/uL Final   Absolute Monocytes 04/13/2023 747  200 - 950 cells/uL Final   Eosinophils Absolute 04/13/2023 200  15 - 500 cells/uL Final   Basophils Absolute 04/13/2023 30  0 - 200 cells/uL Final   Neutrophils Relative % 04/13/2023 65.6  % Final   Total Lymphocyte 04/13/2023 21.2  % Final   Monocytes Relative 04/13/2023 10.1  % Final    Eosinophils Relative 04/13/2023 2.7  % Final   Basophils Relative 04/13/2023 0.4  % Final   Glucose, Bld 04/13/2023 115 (H)  65 - 99 mg/dL Final   Comment: .            Fasting reference interval . For someone without known diabetes, a glucose value between 100 and 125 mg/dL is consistent with prediabetes and should be confirmed with a follow-up test. .    BUN 04/13/2023 27 (H)  7 - 25 mg/dL Final   Creat 16/04/9603 1.27  0.70 - 1.28 mg/dL Final   eGFR 54/03/8118 61  > OR = 60 mL/min/1.69m2 Final   BUN/Creatinine Ratio 04/13/2023 21  6 - 22 (calc) Final   Sodium 04/13/2023 137  135 - 146 mmol/L Final   Potassium 04/13/2023 5.0  3.5 - 5.3 mmol/L Final   Chloride 04/13/2023 105  98 - 110 mmol/L Final   CO2 04/13/2023 25  20 - 32 mmol/L Final   Calcium 04/13/2023 10.2  8.6 - 10.3 mg/dL Final   Total Protein 14/78/2956 6.7  6.1 - 8.1 g/dL Final   Albumin 21/30/8657 4.2  3.6 - 5.1 g/dL Final   Globulin 84/69/6295 2.5  1.9 - 3.7 g/dL (calc) Final   AG Ratio 04/13/2023 1.7  1.0 - 2.5 (calc) Final   Total Bilirubin 04/13/2023 0.4  0.2 - 1.2 mg/dL Final   Alkaline phosphatase (APISO) 04/13/2023 45  35 - 144 U/L Final   AST 04/13/2023 11  10 - 35 U/L Final   ALT 04/13/2023 9  9 - 46 U/L Final   Uric Acid, Serum 04/13/2023 6.4  4.0 - 8.0 mg/dL Final   Comment: Therapeutic target for gout patients: <6.0 mg/dL .    Rheumatoid fact SerPl-aCnc 04/13/2023 <10  <14 IU/mL Final   Anti Nuclear Antibody (ANA) 04/13/2023 POSITIVE (A)  NEGATIVE Final   Comment: ANA IFA is a first line screen for detecting the presence of up to approximately 150 autoantibodies in various autoimmune diseases. A positive ANA IFA result is suggestive of autoimmune disease and reflexes to titer and pattern. Further laboratory testing may be considered if clinically indicated. . For additional information, please refer to http://education.[facility].com/faq/FAQ177 (This link is being provided for  informational/ educational purposes only.) .    Sed Rate 04/13/2023 11  0 - 20 mm/h Final   ANA Titer 1 04/13/2023 1:80 (H)  titer Final   Comment: A low level ANA titer may be present in pre-clinical autoimmune diseases and normal individuals.                 Reference Range                 <  1:40        Negative                 1:40-1:80    Low Antibody Level                 >1:80        Elevated Antibody Level .    ANA Pattern 1 04/13/2023 Nuclear, Homogeneous (A)   Final   Comment: Homogeneous pattern is associated with systemic lupus erythematosus (SLE), drug-induced lupus and juvenile idiopathic arthritis. . AC-1: Homogeneous . International Consensus on ANA Patterns ([email or web address])    ANA TITER 04/13/2023 1:40 (H)  titer Final   Comment: A low level ANA titer may be present in pre-clinical autoimmune diseases and normal individuals.                 Reference Range                 <1:40        Negative                 1:40-1:80    Low Antibody Level                 >1:80        Elevated Antibody Level .    ANA PATTERN 04/13/2023 Cytoplasmic (A)   Final   Comment: The presence of cytoplasmic fluorescence was noted on the HEp-2 slide. Other reactivities (e.g., anti- mitochondrial antibodies or anti-smooth muscle antibodies) may be responsible for this fluorescence. The clinical significance of this finding is uncertain. Clinical correlation is recommended. . AC-15 to AC-23: Cytoplasmic . International Consensus on ANA Patterns ([email or web address])   Patient's uric acid level was 6.4.  Given that his sed rate was 11, I did not feel that this was consistent with a gout exacerbation.  Therefore I had him to stop colchicine but continue doxycycline for possible cellulitis.  His rheumatoid factor was negative.  His ANA was positive.  However again, given his normal sed rate, I felt that this was likely a false positive and not  indicative of an autoimmune disease.  Patient returns today for reevaluation. 04/17/23  Redness has totally subsided.  However the patient continues to have pain and swelling in his left ankle.  He states that it feels like there is a stabbing pain in the ankle especially at night.  He denies any fevers or chills.  He denies any signs of systemic illness.  It hurts to bear weight.  It hurts to walk.  At that time, my plan was: Based on presentation and history I believe the patient is dealing with a gout exacerbation versus pseudogout.  I will treat the patient with prednisone taper pack.  Reassess next week.  I know his uric acid level was relatively low at 6.4 but this is not conclusive for excluding gout.  If the pain is much better next week this would prove 1 of these 2 possible causes.  There is no evidence of cellulitis or osteomyelitis.  However if the pain is persistent, I would recommend an MRI of the ankle to evaluate for stress fracture.  04/28/23 Patient states that his pain completely went away when he was taking prednisone.  He felt almost 100% better.  A few days after discontinuing prednisone, the pain has returned.  However now it is in the left first MTP joint, along the left ankle and in the left lateral malleolus.  Patient reports pain with  ambulation and tenderness to the touch.  There is no erythema.  There is no warmth.  There is minimal swelling. Past Medical History:  Diagnosis Date   AICD (automatic cardioverter/defibrillator) present    Aortic insufficiency 04/21/2018   Aortic insufficiency    Arthritis    "joints" (09/26/2013)   Atrial enlargement, left    CHF (congestive heart failure) (HCC)    Essential hypertension 11/28/2020   Hearing aid worn    both ears   Mitral regurgitation 04/21/2018   Obesity    OSA on CPAP    "mask adjusts to what I need" (09/26/2013)   Persistent atrial fibrillation (HCC)    cardioversion 06/06/13   PONV (postoperative nausea and  vomiting)    Rotator cuff tear, right dec 2012   physical therapy done, decreased strength   S/P aortic valve repair 05/18/2018   Complex valvuloplasty including plication of right coronary leaflet and 21 mm Biostable HAART annuloplasty ring   S/P Maze operation for atrial fibrillation 05/18/2018   Complete bilateral atrial lesion set using bipolar radiofrequency and cryothermy ablation with clipping of LA appendage   S/P mitral valve repair 05/18/2018   Complex valvuloplasty including artificial Gore-tex neochord placement x4 and 30 mm Sorin Memo 4D ring annuloplasty   Wears glasses    Past Surgical History:  Procedure Laterality Date   AORTIC VALVE REPAIR N/A 05/18/2018   Procedure: AORTIC VALVE REPAIR using HAART 300 Aortic Annuloplasty Device size 21mm;  Surgeon: Purcell Nails, MD;  Location: [facility] OR;  Service: Open Heart Surgery;  Laterality: N/A;   ATRIAL FIBRILLATION ABLATION N/A 04/26/2019   Procedure: ATRIAL FIBRILLATION ABLATION;  Surgeon: Hillis Range, MD;  Location: [facility] INVASIVE CV LAB;  Service: Cardiovascular;  Laterality: N/A;   AV NODE ABLATION N/A 12/20/2019   Procedure: AV NODE ABLATION;  Surgeon: Hillis Range, MD;  Location: [facility];  Service: Cardiovascular;  Laterality: N/A;   BIV ICD INSERTION CRT-D N/A 08/23/2019   Procedure: BIV ICD INSERTION CRT-D;  Surgeon: Hillis Range, MD;  Location: [facility];  Service: Cardiovascular;  Laterality: N/A;   CARDIOVERSION N/A 06/06/2013   Procedure: CARDIOVERSION;  Surgeon: Thurmon Fair, MD;  Location: [facility] ENDOSCOPY;  Service: Cardiovascular;  Laterality: N/A;   CARDIOVERSION N/A 09/28/2013   Procedure: CARDIOVERSION BEDSIDE;  Surgeon: Peter M Swaziland, MD;  Location: [facility] OR;  Service: Cardiovascular;  Laterality: N/A;   CARDIOVERSION N/A 11/30/2018   Procedure: CARDIOVERSION;  Surgeon: Lars Masson, MD;  Location: [facility] ENDOSCOPY;  Service: Cardiovascular;  Laterality: N/A;   CARDIOVERSION N/A 03/10/2019    Procedure: CARDIOVERSION;  Surgeon: Little Ishikawa, MD;  Location: [facility] ENDOSCOPY;  Service: Endoscopy;  Laterality: N/A;   CARDIOVERSION N/A 12/20/2021   Procedure: CARDIOVERSION;  Surgeon: Laurey Morale, MD;  Location: [facility] ENDOSCOPY;  Service: Cardiovascular;  Laterality: N/A;   CARPAL TUNNEL RELEASE Right 08/08/2013   Procedure: RIGHT CARPAL TUNNEL RELEASE;  Surgeon: Nicki Reaper, MD;  Location: [facility];  Service: Orthopedics;  Laterality: Right;   CARPAL TUNNEL RELEASE Left 2008   CLIPPING OF ATRIAL APPENDAGE  05/18/2018   Procedure: CLIPPING OF ATRIAL APPENDAGE using AtriCure Clip size 45;  Surgeon: Purcell Nails, MD;  Location: [facility] OR;  Service: Open Heart Surgery;;   KNEE ARTHROSCOPY Right 1990's   MAZE N/A 05/18/2018   Procedure: MAZE;  Surgeon: Purcell Nails, MD;  Location: [facility] OR;  Service: Open Heart Surgery;  Laterality: N/A;   MITRAL VALVE REPAIR N/A  05/18/2018   Procedure: MITRAL VALVE REPAIR (MVR) using 4D Memo Ring size 30;  Surgeon: Purcell Nails, MD;  Location: [facility] OR;  Service: Open Heart Surgery;  Laterality: N/A;   RIGHT/LEFT HEART CATH AND CORONARY ANGIOGRAPHY N/A 04/21/2018   Procedure: RIGHT/LEFT HEART CATH AND CORONARY ANGIOGRAPHY;  Surgeon: Yvonne Kendall, MD;  Location: [facility] INVAS[facility] CV LAB;  Service: Cardiovascular;  Laterality: N/A;   SHOULDER OPEN ROTATOR CUFF REPAIR Left 1990's   SHOULDER OPEN ROTATOR CUFF REPAIR Right 11/2007   TEE WITHOUT CARDIOVERSION N/A 04/21/2018   Procedure: TRANSESOPHAGEAL ECHOCARDIOGRAM (TEE);  Surgeon: Jake Bathe, MD;  Location: [facility] ENDOSCOPY;  Service: Cardiovascular;  Laterality: N/A;   TEE WITHOUT CARDIOVERSION N/A 05/18/2018   Procedure: TRANSESOPHAGEAL ECHOCARDIOGRAM (TEE);  Surgeon: Purcell Nails, MD;  Location[facility] OR;  Service: Open Heart Surgery;  Laterality: N/A;   TONSILLECTOMY  1950's   TOTAL HIP ARTHROPLASTY Left 2010   TOTAL HIP ARTHROPLASTY Right 05/29/2020   Procedure: TOTAL HIP ARTHROPLASTY  ANTERIOR APPROACH;  Surgeon: Sheral Apley, MD;  Location: [facility] ORS;  Service: Orthopedics;  Laterality: Right;   TOTAL HIP REVISION Left 10/08/2011   TOTAL HIP REVISION  04/09/2012   Procedure: TOTAL HIP REVISION;  Surgeon: Loanne Drilling, MD;  Location: [facility] ORS;  Service: Orthopedics;  Laterality: Left;  Left Hip Acetabular Revision vs Constrained Liner   TOTAL KNEE ARTHROPLASTY Right 2006    TRIGGER FINGER RELEASE Right 08/08/2013   Procedure: RELEASE STENOSING TENOSYNOVITIS RIGHT THUMB;  Surgeon: Nicki Reaper, MD;  Location: [facility];  Service: Orthopedics;  Laterality: Right;   Current Outpatient Medications on File Prior to Visit  Medication Sig Dispense Refill   acetaminophen (TYLENOL) 325 MG tablet Take 2 tablets (650 mg total) by mouth every 4 (four) hours as needed for headache or mild pain.     carvedilol (COREG) 6.25 MG tablet TAKE 1 TABLET (6.25 MG TOTAL) BY MOUTH 2 (TWO) TIMES DAILY WITH A MEAL. NEEDS FOLLOW UP APPOINTMENT 180 tablet 3   clobetasol ointment (TEMOVATE) 0.05 % Apply 1 application topically daily as needed (rash).     dapagliflozin propanediol (FARXIGA) 10 MG TABS tablet Take 1 tablet (10 mg total) by mouth daily before breakfast. 30 tablet 11   diclofenac Sodium (VOLTAREN) 1 % GEL Apply 2 g topically daily as needed (pain).      HYDROcodone-acetaminophen (NORCO/VICODIN) 5-325 MG tablet Take 2 tablets by mouth every 8 (eight) hours as needed. 30 tablet 0   Magnesium 250 MG TABS Take 2 tablets by mouth daily.     NON FORMULARY Pt uses a cpap nightly     potassium chloride SA (KLOR-CON M) 20 MEQ tablet Take 1 tablet (20 mEq total) by mouth daily. 90 tablet 3   sacubitril-valsartan (ENTRESTO) 24-26 MG TAKE 1 TABLET BY MOUTH TWICE A DAY 60 tablet 11   spironolactone (ALDACTONE) 25 MG tablet TAKE 1 TABLET BY MOUTH EVERY DAY 90 tablet 3   tadalafil (CIALIS) 10 MG tablet Take 10 mg by mouth daily as needed for erectile dysfunction.     torsemide (DEMADEX) 20  MG tablet Take 20 mg by mouth daily.     XARELTO 20 MG TABS tablet TAKE ONE TABLET BY MOUTH DAILY WITH SUPPER 30 tablet 10   colchicine 0.6 MG tablet Take 1 tablet (0.6 mg total) by mouth daily. Take 2 pills immediately and 1 pill in 1 hour if pain persists (Patient not taking: Reported on 04/28/2023) 30 tablet 1  doxycycline (VIBRA-TABS) 100 MG tablet Take 1 tablet (100 mg total) by mouth 2 (two) times daily. (Patient not taking: Reported on 04/28/2023) 14 tablet 0   No current facility-administered medications on file prior to visit.   Allergies  Allergen Reactions   Metoprolol Tartrate Shortness Of Breath    Shortness of breath, pressure in chest and back   Oxycodone Rash and Other (See Comments)    Can tolerate Hydrocodone   Social History   Socioeconomic History   Marital status: Married    Spouse name: Not on file   Number of children: Not on file   Years of education: Not on file   Highest education level: Not on file  Occupational History   Occupation: Public affairs consultant  Tobacco Use   Smoking status: Never   Smokeless tobacco: Never  Vaping Use   Vaping status: Never Used  Substance and Sexual Activity   Alcohol use: No   Drug use: No   Sexual activity: Yes  Other Topics Concern   Not on file  Social History Narrative   Pt lives in [place] with spouse.  Leadership training and behavioral safety training.   Social Determinants of Health   Financial Resource Strain: Not on file  Food Insecurity: Not on file  Transportation Needs: No Transportation Needs (09/27/2019)   PRAPARE - Administrator, Civil Service (Medical): No    Lack of Transportation (Non-Medical): No  Physical Activity: Not on file  Stress: Not on file  Social Connections: Unknown (11/24/2021)   [facility]   Social Network    Social Network: Not on file  Intimate Partner Violence: Unknown (10/16/2021)   [facility]   HITS    Physically Hurt: Not on file    Insult or Talk Down To: Not on file    Threaten Physical Harm: Not on file    Scream or Curse: Not on file      Review of Systems  All other systems reviewed and are negative.      Objective:   Physical Exam Vitals reviewed.  Constitutional:      General: He is not in acute distress.    Appearance: Normal appearance. He is obese. He is not ill-appearing or toxic-appearing.  Cardiovascular:     Rate and Rhythm: Normal rate and regular rhythm.     Heart sounds: Normal heart sounds.  Pulmonary:     Effort: Pulmonary effort is normal. No respiratory distress.     Breath sounds: Normal breath sounds. No stridor. No wheezing, rhonchi or rales.  Abdominal:     General: Abdomen is flat. Bowel sounds are normal.     Palpations: Abdomen is soft.  Musculoskeletal:     Right lower leg: No edema.     Left lower leg: No edema.     Left ankle: Swelling present. Tenderness present. Decreased range of motion.       Legs:  Skin:    Findings: No erythema.  Neurological:     General: No focal deficit present.     Mental Status: He is alert and oriented to person, place, and time.     Cranial Nerves: No cranial nerve deficit.     Sensory: No sensory deficit.     Motor: No weakness.     Coordination: Coordination normal.     Gait: Gait abnormal.           Assessment & Plan:  Acute left ankle pain - Plan: Uric acid, CBC with Differential/Platelet, Sedimentation rate, C-reactive protein I believe that the patient responded to prednisone due to the fact that this is an inflammatory arthritis.  I still believe that gout versus pseudogout is the most likely explanation.  Repeat uric acid level today.  Start prednisone taper pack to decrease inflammation.  Also begin colchicine 0.6 mg p.o. daily.  Hopefully after the prednisone is calm the inflammation with the colchicine will keep the pain from returning.  If the patient is  doing well in 2 weeks I plan to start allopurinol as a preventative.  If this is not working I would recommend rheumatology consultation for inflammatory arthritis.  He already has an appointment to see rheumatology the first week of November

## 2023-04-29 LAB — CBC WITH DIFFERENTIAL/PLATELET
Absolute Lymphocytes: 2208 {cells}/uL (ref 850–3900)
Absolute Monocytes: 1296 {cells}/uL — ABNORMAL HIGH (ref 200–950)
Basophils Absolute: 36 {cells}/uL (ref 0–200)
Basophils Relative: 0.3 %
Eosinophils Absolute: 240 {cells}/uL (ref 15–500)
Eosinophils Relative: 2 %
HCT: 47.5 % (ref 38.5–50.0)
Hemoglobin: 15.6 g/dL (ref 13.2–17.1)
MCH: 28.4 pg (ref 27.0–33.0)
MCHC: 32.8 g/dL (ref 32.0–36.0)
MCV: 86.4 fL (ref 80.0–100.0)
MPV: 11.5 fL (ref 7.5–12.5)
Monocytes Relative: 10.8 %
Neutro Abs: 8220 {cells}/uL — ABNORMAL HIGH (ref 1500–7800)
Neutrophils Relative %: 68.5 %
Platelets: 195 10*3/uL (ref 140–400)
RBC: 5.5 10*6/uL (ref 4.20–5.80)
RDW: 12.8 % (ref 11.0–15.0)
Total Lymphocyte: 18.4 %
WBC: 12 10*3/uL — ABNORMAL HIGH (ref 3.8–10.8)

## 2023-04-29 LAB — C-REACTIVE PROTEIN: CRP: 15.8 mg/L — ABNORMAL HIGH (ref <8.0)

## 2023-04-29 LAB — URIC ACID: Uric Acid, Serum: 7 mg/dL (ref 4.0–8.0)

## 2023-04-29 LAB — SEDIMENTATION RATE: Sed Rate: 2 mm/h (ref 0–20)

## 2023-05-03 ENCOUNTER — Other Ambulatory Visit: Payer: Self-pay | Admitting: Family Medicine

## 2023-05-04 NOTE — Telephone Encounter (Signed)
Requested Prescriptions  Pending Prescriptions Disp Refills   colchicine 0.6 MG tablet [Pharmacy Med Name: COLCHICINE 0.6 MG TABLET] 90 tablet 1    Sig: PLEASE SEE ATTACHED FOR DETAILED DIRECTIONS     Endocrinology:  Gout Agents - colchicine Failed - 05/03/2023 10:32 AM      Failed - Valid encounter within last 12 months    Recent Outpatient Visits   None     Future Appointments             In 1 month Deveshwar, Janalyn Rouse, MD [facility]            Failed - CBC within normal limits and completed in the last 12 months    WBC  Date Value Ref Range Status  04/28/2023 12.0 (H) 3.8 - 10.8 Thousand/uL Final   RBC  Date Value Ref Range Status  04/28/2023 5.50 4.20 - 5.80 Million/uL Final   Hemoglobin  Date Value Ref Range Status  04/28/2023 15.6 13.2 - 17.1 g/dL Final  09/81/1914 78.2 13.0 - 17.7 g/dL Final   Total hemoglobin  Date Value Ref Range Status  05/20/2018 10.4 (L) 12.0 - 16.0 g/dL Final   HCT  Date Value Ref Range Status  04/28/2023 47.5 38.5 - 50.0 % Final   Hematocrit  Date Value Ref Range Status  07/09/2021 41.6 37.5 - 51.0 % Final   MCHC  Date Value Ref Range Status  04/28/2023 32.8 32.0 - 36.0 g/dL Final    Comment:    For adults, a slight decrease in the calculated MCHC value (in the range of 30 to 32 g/dL) is most likely not clinically significant; however, it should be interpreted with caution in correlation with other red cell parameters and the patient's clinical condition.    [facility]  Date Value Ref Range Status  04/28/2023 28.4 27.0 - 33.0 pg Final   MCV  Date Value Ref Range Status  04/28/2023 86.4 80.0 - 100.0 fL Final  07/09/2021 84 79 - 97 fL Final   No results found for: "PLTCOUNTKUC", "LABPLAT", "POCPLA" RDW  Date Value Ref Range Status  04/28/2023 12.8 11.0 - 15.0 % Final  07/09/2021 13.5 11.6 - 15.4 % Final         Passed - Cr in normal range and within 360 days    Creat  Date Value Ref Range Status   04/13/2023 1.27 0.70 - 1.28 mg/dL Final         Passed - ALT in normal range and within 360 days    ALT  Date Value Ref Range Status  04/13/2023 9 9 - 46 U/L Final         Passed - AST in normal range and within 360 days    AST  Date Value Ref Range Status  04/13/2023 11 10 - 35 U/L Final

## 2023-05-05 NOTE — Progress Notes (Signed)
Office Visit Note  Patient: [name]             Date of Birth: 10/16/1950           MRN: [id number]             PCP: Donita Brooks, MD Referring: Exie Parody Visit Date: 05/19/2023 Occupation: @GUAROCC @  Subjective:  Pain in multiple joints and swelling  History of Present Illness: [name] is a 72 y.o. male seen in consultation per request of Dr. Tanya Nones for evaluation of possible gout.  According the patient he has had history of joint pain for many years.  He underwent right total knee replacement in 2006 by Dr. Eulah Andrews for right knee osteoarthritis.  He was also told that he had left knee severe osteoarthritis and will require knee replacement in the future.  He had bilateral total hip replacement in the last few years.  He states he has been experiencing numbness in his bilateral feet for at least last 1 year.  Last Memorial Day weekend he had difficulty getting out of the bed due to lower back pain.  He was seen by Dr. Candis Shine and had x-rays and MRI and was diagnosed with bulging disc.  He was treated with pain meds and muscle relaxers.  Eventually the symptoms improved.  He saw Dr. Jillyn Hidden yesterday.  He also has arthritis in his both hands for the last 2 years.  He states he has been under care of Dr. Nelva Nay who diagnosed him with severe osteoarthritis.  He gives history of intermittent swelling in his left wrist and discomfort in his right wrist.  He has decreased grip strength.  He also has discomfort in his bilateral shoulders for many years.  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  He states he was advised total shoulder replacement in the past.  He states about a month ago he started having pain and swelling in his left foot to the point he was unable to stand.  He was taken to the emergency room by EMS at [facility].  He states he was given antibiotics for cellulitis and then discharged home.  He was seen by Dr. Tanya Nones after discharge who  suspected it was gout.  Dr. Tanya Nones gave him a prescription for prednisone which helped with the left ankle swelling but the swelling soon recurred after the prednisone taper was finished.  At the time Dr. Gailen Shelter gave him his second prednisone taper and also started him on colchicine daily.  He has been taking daily colchicine for the last 2 weeks.  He has noticed improvement in the ankle swelling but he continues to have some discomfort in his feet which she describes over the metatarsals.  Left wrist continues to swell.  None of the other joints are swollen.  Dr. Tanya Nones also discussed future allopurinol with him.  He finished prednisone about 2 weeks ago.  His mother had arthritis but he is uncertain if that is any family members with autoimmune disease.  His eats red meat and shrimp.  He drinks beer occasionally.  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    Activities of Daily Living:  Patient reports morning stiffness for all day. Patient Reports nocturnal pain.  Difficulty dressing/grooming: Reports Difficulty climbing stairs: Reports Difficulty getting out of chair: Denies  Difficulty using hands for taps, buttons, cutlery, and/or writing: Reports  Review of Systems  Constitutional:  Positive for fatigue.  HENT:  Negative for mouth sores and mouth dryness.   Eyes:  Negative for dryness.  Respiratory:  Negative for shortness of breath.   Cardiovascular:  Negative for chest pain and palpitations.  Gastrointestinal:  Negative for blood in stool, constipation and diarrhea.  Endocrine: Negative for increased urination.  Genitourinary:  Negative for involuntary urination.  Musculoskeletal:  Positive for joint pain, gait problem, joint pain and morning stiffness. Negative for joint swelling, myalgias, muscle weakness, muscle tenderness and myalgias.  Skin:  Negative for  color change, rash and sensitivity to sunlight.  Allergic/Immunologic: Negative for susceptible to infections.  Neurological:  Negative for dizziness and headaches.  Hematological:  Negative for swollen glands.  Psychiatric/Behavioral:  Positive for sleep disturbance. Negative for depressed mood. The patient is not nervous/anxious.     PMFS History:  Patient Active Problem List   Diagnosis Date Noted   Carpal tunnel syndrome 11/28/2020   Colon cancer screening 11/28/2020   Constipation 11/28/2020   Essential hypertension 11/28/2020   History of colonic polyps 11/28/2020   Presence of prosthetic heart valve 11/28/2020   Primary localized osteoarthritis of pelvic region and thigh 11/28/2020   Chronic systolic heart failure (HCC) 07/11/2019   Acquired thrombophilia (HCC) 05/25/2019   Atypical atrial flutter (HCC)    S/P aortic valve repair 05/18/2018   S/P mitral valve repair 05/18/2018   S/P Maze operation for atrial fibrillation 05/18/2018   Acute on chronic combined systolic (congestive) and diastolic (congestive) heart failure (HCC) 04/21/2018   Mitral regurgitation 04/21/2018   Aortic insufficiency 04/21/2018   Obesity (BMI 30-39.9)    Chronic diastolic congestive heart failure (HCC)    Hypertensive cardiovascular disease 07/20/2013   Left atrial enlargement 07/20/2013   Obstructive sleep apnea 05/11/2013   Persistent atrial fibrillation (HCC)    Instability of prosthetic hip (HCC) 04/09/2012   Pain due to total hip replacement (HCC) 10/08/2011    Past Medical History:  Diagnosis Date   AICD (automatic cardioverter/defibrillator) present    Aortic insufficiency 04/21/2018   Aortic insufficiency    Arthritis    "joints" (09/26/2013)   Atrial enlargement, left    CHF (congestive heart failure) (HCC)    Essential hypertension 11/28/2020   Hearing aid worn    both ears   Mitral regurgitation 04/21/2018   Obesity    OSA on CPAP    "mask adjusts to what I need" (09/26/2013)    Persistent atrial fibrillation (HCC)    cardioversion 06/06/13   PONV (postoperative nausea and vomiting)    Rotator cuff tear, right dec 2012   physical therapy done, decreased strength   S/P aortic valve repair 05/18/2018   Complex valvuloplasty including plication of right coronary leaflet and 21 mm Biostable HAART annuloplasty ring   S/P Maze operation for atrial fibrillation 05/18/2018   Complete bilateral atrial lesion set using bipolar radiofrequency and cryothermy ablation with clipping of LA appendage   S/P mitral valve repair 05/18/2018   Complex valvuloplasty including artificial Gore-tex neochord placement x4 and 30 mm Sorin Memo 4D ring annuloplasty   Wears glasses     Family History  Problem Relation Age of Onset   Heart Problems Mother    Arthritis Mother    Cancer Sister    Other Sister        bilateral hip replacements   Other Other  mother had a pacemaker   Healthy Son    Healthy Son    Healthy Daughter    Past Surgical History:  Procedure Laterality Date   AORTIC VALVE REPAIR N/A 05/18/2018   Procedure: AORTIC VALVE REPAIR using HAART 300 Aortic Annuloplasty Device size 21mm;  Surgeon: Purcell Nails, MD;  Location: [facility] OR;  Service: Open Heart Surgery;  Laterality: N/A;   ATRIAL FIBRILLATION ABLATION N/A 04/26/2019   Procedure: ATRIAL FIBRILLATION ABLATION;  Surgeon: Hillis Range, MD;  Location: [facility] INVASIVE CV LAB;  Service: Cardiovascular;  Laterality: N/A;   AV NODE ABLATION N/A 12/20/2019   Procedure: AV NODE ABLATION;  Surgeon: Hillis Range, MD;  Location: [facility] INVASIVE CV LAB;  Service: Cardiovascular;  Laterality: N/A;   BIV ICD INSERTION CRT-D N/A 08/23/2019   Procedure: BIV ICD INSERTION CRT-D;  Surgeon: Hillis Range, MD;  Location: [facility] INVASIVE CV LAB;  Service: Cardiovascular;  Laterality: N/A;   CARDIOVERSION N/A 06/06/2013   Procedure: CARDIOVERSION;  Surgeon: Thurmon Fair, MD;  Location: [facility] ENDOSCOPY;  Service: Cardiovascular;   Laterality: N/A;   CARDIOVERSION N/A 09/28/2013   Procedure: CARDIOVERSION BEDSIDE;  Surgeon: Peter M Swaziland, MD;  Location: [facility] OR;  Service: Cardiovascular;  Laterality: N/A;   CARDIOVERSION N/A 11/30/2018   Procedure: CARDIOVERSION;  Surgeon: Lars Masson, MD;  Location: [facility] ENDOSCOPY;  Service: Cardiovascular;  Laterality: N/A;   CARDIOVERSION N/A 03/10/2019   Procedure: CARDIOVERSION;  Surgeon: Little Ishikawa, MD;  Location: [facility] ENDOSCOPY;  Service: Endoscopy;  Laterality: N/A;   CARDIOVERSION N/A 12/20/2021   Procedure: CARDIOVERSION;  Surgeon: Laurey Morale, MD;  Location: [facility] ENDOSCOPY;  Service: Cardiovascular;  Laterality: N/A;   CARPAL TUNNEL RELEASE Right 08/08/2013   Procedure: RIGHT CARPAL TUNNEL RELEASE;  Surgeon: Nicki Reaper, MD;  Location: [facility];  Service: Orthopedics;  Laterality: Right;   CARPAL TUNNEL RELEASE Left 07/14/2006   CLIPPING OF ATRIAL APPENDAGE  05/18/2018   Procedure: CLIPPING OF ATRIAL APPENDAGE using AtriCure Clip size 45;  Surgeon: Purcell Nails, MD;  Location: [facility] OR;  Service: Open Heart Surgery;;   KNEE ARTHROSCOPY Right 03/14/1989   MAZE N/A 05/18/2018   Procedure: MAZE;  Surgeon: Purcell Nails, MD;  Location: [facility] OR;  Service: Open Heart Surgery;  Laterality: N/A;   MITRAL VALVE REPAIR N/A 05/18/2018   Procedure: MITRAL VALVE REPAIR (MVR) using 4D Memo Ring size 30;  Surgeon: Purcell Nails, MD;  Location: [facility] OR;  Service: Open Heart Surgery;  Laterality: N/A;   PACEMAKER INSERTION     RIGHT/LEFT HEART CATH AND CORONARY ANGIOGRAPHY N/A 04/21/2018   Procedure: RIGHT/LEFT HEART CATH AND CORONARY ANGIOGRAPHY;  Surgeon: Yvonne Kendall, MD;  Location: [facility] INVASIVE CV LAB;  Service: Cardiovascular;  Laterality: N/A;   SHOULDER OPEN ROTATOR CUFF REPAIR Left 03/14/1989   SHOULDER OPEN ROTATOR CUFF REPAIR Right 11/12/2007   TEE WITHOUT CARDIOVERSION N/A 04/21/2018   Procedure: TRANSESOPHAGEAL ECHOCARDIOGRAM (TEE);   Surgeon: Jake Bathe, MD;  Location: [facility] ENDOSCOPY;  Service: Cardiovascular;  Laterality: N/A;   TEE WITHOUT CARDIOVERSION N/A 05/18/2018   Procedure: TRANSESOPHAGEAL ECHOCARDIOGRAM (TEE);  Surgeon: Purcell Nails, MD;  Location: [facility] OR;  Service: Open Heart Surgery;  Laterality: N/A;   TONSILLECTOMY  1950's   TOTAL HIP ARTHROPLASTY Left 07/14/2008   TOTAL HIP ARTHROPLASTY Right 05/29/2020   Procedure: TOTAL HIP ARTHROPLASTY ANTERIOR APPROACH;  Surgeon: Sheral Apley, MD;  Location: [facility] ORS;  Service: Orthopedics;  Laterality: Right;   TOTAL HIP REVISION Left  10/08/2011   TOTAL HIP REVISION  04/09/2012   Procedure: TOTAL HIP REVISION;  Surgeon: Loanne Drilling, MD;  Location: [facility] ORS;  Service: Orthopedics;  Laterality: Left;  Left Hip Acetabular Revision vs Constrained Liner   TOTAL KNEE ARTHROPLASTY Right 07/14/2004   TRIGGER FINGER RELEASE Right 08/08/2013   Procedure: RELEASE STENOSING TENOSYNOVITIS RIGHT THUMB;  Surgeon: Nicki Reaper, MD;  Location: [facility];  Service: Orthopedics;  Laterality: Right;   Social History   Social History Narrative   Pt lives in [place] with spouse.  Leadership training and behavioral safety training.   Immunization History  Administered Date(s) Administered   Influenza Split 04/13/2009, 05/03/2013, 07/17/2015   Influenza,inj,Quad PF,6+ Mos 05/02/2010, 04/22/2012, 08/13/2017   Influenza,inj,quad, With Preservative 05/15/2014   Influenza-Unspecified 04/13/2013   Td 04/13/2004   Tdap 05/15/2014     Objective: Vital Signs: BP 115/75 (BP Location: Right Arm, Patient Position: Sitting, Cuff Size: Large)   Pulse 60   Resp 17   Ht 5\' 9"  (1.753 m)   Wt 271 lb 9.6 oz (123.2 kg)   BMI 40.11 kg/m    Physical Exam Vitals and nursing note reviewed.  Constitutional:      Appearance: He is well-developed.  HENT:     Head: Normocephalic and atraumatic.  Eyes:     Conjunctiva/sclera: Conjunctivae normal.     Pupils: Pupils are  equal, round, and reactive to light.  Cardiovascular:     Rate and Rhythm: Normal rate and regular rhythm.     Heart sounds: Normal heart sounds.  Pulmonary:     Effort: Pulmonary effort is normal.     Breath sounds: Normal breath sounds.  Abdominal:     General: Bowel sounds are normal.     Palpations: Abdomen is soft.  Musculoskeletal:     Cervical back: Normal range of motion and neck supple.  Skin:    General: Skin is warm and dry.     Capillary Refill: Capillary refill takes less than 2 seconds.  Neurological:     Mental Status: He is alert and oriented to person, place, and time.  Psychiatric:        Behavior: Behavior normal.      Musculoskeletal Exam: Cervical spine was in good range of motion.  Thoracic and lumbar spine range of motion was difficult to assess due to body habitus.  He discomfort range of motion of the lumbar spine.  Shoulder joint abduction was full.  He had limited internal rotation with some discomfort.  Elbow joints were in good range of motion without any synovitis.  He had warmth and swelling over his left wrist joint.  Right wrist joint was in good range of motion.  He had bilateral PIP and DIP thickening with no synovitis.  Hip joints were replaced and were in good range of motion.  Right knee was joint was replaced and was in good range of motion.  He had discomfort range of motion of his left knee joint without any warmth swelling or effusion.  He had no tenderness over ankles.  He had bilateral severe hammertoes.  No synovitis was noted.  CDAI Exam: CDAI Score: -- Patient Global: --; Provider Global: -- Swollen: --; Tender: -- Joint Exam 05/19/2023   No joint exam has been documented for this visit   There is currently no information documented on the homunculus. Go to the Rheumatology activity and complete the homunculus joint exam.  Investigation: No additional findings.  Imaging: No results found.  Recent Labs: Lab Results  Component  Value Date   WBC 12.0 (H) 04/28/2023   HGB 15.6 04/28/2023   PLT 195 04/28/2023   NA 137 04/13/2023   K 5.0 04/13/2023   CL 105 04/13/2023   CO2 25 04/13/2023   GLUCOSE 115 (H) 04/13/2023   BUN 27 (H) 04/13/2023   CREATININE 1.27 04/13/2023   BILITOT 0.4 04/13/2023   ALKPHOS 70 06/17/2020   AST 11 04/13/2023   ALT 9 04/13/2023   PROT 6.7 04/13/2023   ALBUMIN 3.8 06/17/2020   CALCIUM 10.2 04/13/2023   GFRAA 58 (L) 03/23/2020   April 13, 2023 ANA 1: 80 NH, 1: 40 cytoplasmic, RF negative, ESR 11, uric acid 6.4 April 28, 2023 uric acid 7.0, sed rate 2, CRP 15.8  Speciality Comments: No specialty comments available.  Procedures:  No procedures performed Allergies: Metoprolol tartrate and Oxycodone   Assessment / Plan:     Visit Diagnoses: Polyarthralgia -patient has been experiencing pain and discomfort in multiple joints over many years.  He has been having increased inflammation in his joints for the last 1-1/2-year intermittently per patient.  He had episodes of swelling in his wrist joints in his left ankle.  He had recent labs by Dr. Tanya Nones which showed RF negative, ANA low titer not significant, uric acid high normal.  Sed rate was normal C-reactive protein was elevated.  Chronic inflammatory arthritis -patient reports intermittent pain and swelling in his left wrist joint for the last 1-1/2-year.  He also has discomfort in his right wrist.  He had some swelling on palpation of his left wrist today.  He also had limited range of motion of his left wrist.  He had an episode of left ankle swelling about a month ago which responded to prednisone taper and had recurrence after the prednisone taper was over.  He was given his second prednisone taper followed by colchicine.  He has been taking colchicine 0.6 mg daily.  He had no recurrence of swelling since then.  Dr. Tanya Nones discussed possible differential of pseudogout versus gout with the patient.  I am in agreement with that.   Will repeat uric acid today as he is not having a flare now.  I will also check anti-CCP antibody.  I advised him to reduce the dose of colchicine to 0.3 mg p.o. daily as he is on carvedilol which can cause drug interaction.  I also discussed possible use of allopurinol.  Patient would like to hold off until he x-rays and lab results available.  Plan: Magnesium, Uric acid, Cyclic citrul peptide antibody, IgG  Chondrocalcinosis-was noted in the x-rays of the left knee.  Chronic pain of both shoulders-he gives history of pain and discomfort in his bilateral shoulders.  Patient states he was advised to total shoulder replacement by orthopedics.  He had limited internal rotation.  He had full range of motion of abduction and forward flexion.  He had been painful range of motion of bilateral shoulders.  Pain in both hands -he complains of right wrist pain and left wrist joint swelling.  Synovitis was noted over his left wrist joint.  PIP and DIP thickening was noted.  I will obtain x-rays today.  Patient states he was given the diagnosis of osteoarthritis by Dr. Amanda Pea in the past.  Plan: XR Hand 2 View Right, XR Hand 2 View Left.  X-rays of bilateral hands were consistent with osteoarthritis.  Right second MCP narrowing was noted.  Status post total replacement of both  hips - RTHR11/21 by Dr. Eulah Andrews, [facility] 09/13 by Dr. Despina Hick  Chronic pain of left knee -he continues to have pain and discomfort in his left knee joint.  Plan: XR KNEE 3 VIEW LEFT moderate lateral compartment narrowing and moderate chondromalacia patella was noted.  Chondrocalcinosis was noted.  Status post total knee replacement, right-2006 by Dr. Eulah Andrews.  According the patient right total knee replacement is doing well.  Pain in both feet -he has been experiencing increased pain and swelling in his left knee joint for the last two 1 month.  He had 2 courses of prednisone.  He is currently taking colchicine which has resolved and swelling.  I  discussed the possibility of pseudogout and gout with the patient as well.  He will continue colchicine 0.3 mg daily for now.  We will discuss allopurinol at the follow-up visit if needed.  He also has bilateral hammertoes.  He is seeing Dr. Allena Katz in the past.  He states he was given narcotics which he could not tolerate.  I advised him to schedule a follow-up appointment with Dr. Allena Katz.  Plan: XR Foot 2 Views Right, XR Foot 2 Views Left.  X-rays were suggestive of osteoarthritis.  Neuropathy -he complains of numbness in his bilateral feet.  The symptoms could be related to his disc disease.  I also advised him to possible future referral to plan: B12 and Folate Panel  Degeneration of intervertebral disc of lumbar region without discogenic back pain or lower extremity pain-patient eval of severe lower back pain on Memorial Day weekend.  Patient had extensive workup by Dr. Jillyn Hidden including x-rays and MRI.  I reviewed the MRI from March 30, 2023 which showed inferiorly migrated central and left subarticular zone extrusion of L2-3 with severe spinal canal stenosis with impingement of the cauda equina nerve roots left worse than right.  These findings are worse than the previous study of 2018.  Primary osteoarthritis of both feet-he has osteoarthritic changes in bilateral feet with hammertoes.  Patient will follow-up with Dr. Allena Katz.  Other medical problems are listed as follows:  Chronic diastolic congestive heart failure (HCC)  Chronic systolic heart failure (HCC)  Essential hypertension-blood pressure is normal today at 115/75.  Presence of combination internal cardiac defibrillator (ICD) and pacemaker  Atypical atrial flutter (HCC)  Persistent atrial fibrillation (HCC)  S/P aortic valve repair  S/P Maze operation for atrial fibrillation  S/P mitral valve repair  History of colonic polyps  Obstructive sleep apnea - on CPAP  Obesity (BMI 30-39.9)  Medication management - Plan:  COMPLETE METABOLIC PANEL WITH GFR  Orders: Orders Placed This Encounter  Procedures   XR Hand 2 View Right   XR Hand 2 View Left   XR Foot 2 Views Right   XR Foot 2 Views Left   XR KNEE 3 VIEW LEFT   COMPLETE METABOLIC PANEL WITH GFR   Magnesium   Uric acid   Cyclic citrul peptide antibody, IgG   B12 and Folate Panel   No orders of the defined types were placed in this encounter.   Face-to-face time spent with patient was 60  minutes. Greater than 50% of time was spent in counseling and coordination of care.  Follow-Up Instructions: Return for Inflammatory arthritis.   Pollyann Savoy, MD  Note - This record has been created using Animal nutritionist.  Chart creation errors have been sought, but may not always  have been located. Such creation errors do not reflect on  the standard of medical  care.

## 2023-05-11 ENCOUNTER — Telehealth: Payer: Self-pay | Admitting: Family Medicine

## 2023-05-11 ENCOUNTER — Other Ambulatory Visit: Payer: Self-pay

## 2023-05-11 DIAGNOSIS — Z1211 Encounter for screening for malignant neoplasm of colon: Secondary | ICD-10-CM

## 2023-05-11 NOTE — Telephone Encounter (Signed)
Patient's wife dropped off copy of letter patient received from [facility] to recommend colonoscopy; patient requesting order.   Copy of letter placed on nurse's desk.  Patient's wife also reports that the patient is doing much better since receiving prednisone for gout; patient not having any gout issues at this time.   Please advise when referral/order for colonoscopy sent at [phone].

## 2023-05-18 DIAGNOSIS — M51369 Other intervertebral disc degeneration, lumbar region without mention of lumbar back pain or lower extremity pain: Secondary | ICD-10-CM | POA: Diagnosis not present

## 2023-05-18 DIAGNOSIS — M5451 Vertebrogenic low back pain: Secondary | ICD-10-CM | POA: Diagnosis not present

## 2023-05-19 ENCOUNTER — Encounter: Payer: Self-pay | Admitting: Rheumatology

## 2023-05-19 ENCOUNTER — Ambulatory Visit: Payer: Medicare HMO

## 2023-05-19 ENCOUNTER — Encounter (HOSPITAL_COMMUNITY): Payer: Medicare HMO

## 2023-05-19 ENCOUNTER — Ambulatory Visit: Payer: Medicare HMO | Attending: Rheumatology | Admitting: Rheumatology

## 2023-05-19 VITALS — BP 115/75 | HR 60 | Resp 17 | Ht 69.0 in | Wt 271.6 lb

## 2023-05-19 DIAGNOSIS — M25562 Pain in left knee: Secondary | ICD-10-CM | POA: Diagnosis not present

## 2023-05-19 DIAGNOSIS — D6869 Other thrombophilia: Secondary | ICD-10-CM

## 2023-05-19 DIAGNOSIS — M79671 Pain in right foot: Secondary | ICD-10-CM

## 2023-05-19 DIAGNOSIS — M2550 Pain in unspecified joint: Secondary | ICD-10-CM

## 2023-05-19 DIAGNOSIS — Z96651 Presence of right artificial knee joint: Secondary | ICD-10-CM

## 2023-05-19 DIAGNOSIS — E669 Obesity, unspecified: Secondary | ICD-10-CM

## 2023-05-19 DIAGNOSIS — I10 Essential (primary) hypertension: Secondary | ICD-10-CM

## 2023-05-19 DIAGNOSIS — G8929 Other chronic pain: Secondary | ICD-10-CM

## 2023-05-19 DIAGNOSIS — I351 Nonrheumatic aortic (valve) insufficiency: Secondary | ICD-10-CM

## 2023-05-19 DIAGNOSIS — Z8679 Personal history of other diseases of the circulatory system: Secondary | ICD-10-CM

## 2023-05-19 DIAGNOSIS — M19071 Primary osteoarthritis, right ankle and foot: Secondary | ICD-10-CM | POA: Diagnosis not present

## 2023-05-19 DIAGNOSIS — M79641 Pain in right hand: Secondary | ICD-10-CM

## 2023-05-19 DIAGNOSIS — M25511 Pain in right shoulder: Secondary | ICD-10-CM | POA: Diagnosis not present

## 2023-05-19 DIAGNOSIS — M79642 Pain in left hand: Secondary | ICD-10-CM | POA: Diagnosis not present

## 2023-05-19 DIAGNOSIS — Z9889 Other specified postprocedural states: Secondary | ICD-10-CM

## 2023-05-19 DIAGNOSIS — I484 Atypical atrial flutter: Secondary | ICD-10-CM

## 2023-05-19 DIAGNOSIS — M1990 Unspecified osteoarthritis, unspecified site: Secondary | ICD-10-CM | POA: Diagnosis not present

## 2023-05-19 DIAGNOSIS — Z96649 Presence of unspecified artificial hip joint: Secondary | ICD-10-CM

## 2023-05-19 DIAGNOSIS — G4733 Obstructive sleep apnea (adult) (pediatric): Secondary | ICD-10-CM

## 2023-05-19 DIAGNOSIS — M19072 Primary osteoarthritis, left ankle and foot: Secondary | ICD-10-CM

## 2023-05-19 DIAGNOSIS — G629 Polyneuropathy, unspecified: Secondary | ICD-10-CM | POA: Diagnosis not present

## 2023-05-19 DIAGNOSIS — I5022 Chronic systolic (congestive) heart failure: Secondary | ICD-10-CM

## 2023-05-19 DIAGNOSIS — I4819 Other persistent atrial fibrillation: Secondary | ICD-10-CM

## 2023-05-19 DIAGNOSIS — I5032 Chronic diastolic (congestive) heart failure: Secondary | ICD-10-CM

## 2023-05-19 DIAGNOSIS — M1120 Other chondrocalcinosis, unspecified site: Secondary | ICD-10-CM

## 2023-05-19 DIAGNOSIS — Z96643 Presence of artificial hip joint, bilateral: Secondary | ICD-10-CM

## 2023-05-19 DIAGNOSIS — M51369 Other intervertebral disc degeneration, lumbar region without mention of lumbar back pain or lower extremity pain: Secondary | ICD-10-CM | POA: Diagnosis not present

## 2023-05-19 DIAGNOSIS — M79672 Pain in left foot: Secondary | ICD-10-CM | POA: Diagnosis not present

## 2023-05-19 DIAGNOSIS — Z95810 Presence of automatic (implantable) cardiac defibrillator: Secondary | ICD-10-CM

## 2023-05-19 DIAGNOSIS — Z79899 Other long term (current) drug therapy: Secondary | ICD-10-CM

## 2023-05-19 DIAGNOSIS — M25512 Pain in left shoulder: Secondary | ICD-10-CM

## 2023-05-19 DIAGNOSIS — Z952 Presence of prosthetic heart valve: Secondary | ICD-10-CM

## 2023-05-19 DIAGNOSIS — Z860100 Personal history of colon polyps, unspecified: Secondary | ICD-10-CM

## 2023-05-19 NOTE — Patient Instructions (Signed)

## 2023-05-22 LAB — COMPLETE METABOLIC PANEL WITH GFR
AG Ratio: 2 (calc) (ref 1.0–2.5)
ALT: 21 U/L (ref 9–46)
AST: 18 U/L (ref 10–35)
Albumin: 4.6 g/dL (ref 3.6–5.1)
Alkaline phosphatase (APISO): 44 U/L (ref 35–144)
BUN/Creatinine Ratio: 20 (calc) (ref 6–22)
BUN: 29 mg/dL — ABNORMAL HIGH (ref 7–25)
CO2: 30 mmol/L (ref 20–32)
Calcium: 9.9 mg/dL (ref 8.6–10.3)
Chloride: 102 mmol/L (ref 98–110)
Creat: 1.42 mg/dL — ABNORMAL HIGH (ref 0.70–1.28)
Globulin: 2.3 g/dL (ref 1.9–3.7)
Glucose, Bld: 102 mg/dL — ABNORMAL HIGH (ref 65–99)
Potassium: 4.8 mmol/L (ref 3.5–5.3)
Sodium: 140 mmol/L (ref 135–146)
Total Bilirubin: 0.6 mg/dL (ref 0.2–1.2)
Total Protein: 6.9 g/dL (ref 6.1–8.1)
eGFR: 53 mL/min/{1.73_m2} — ABNORMAL LOW (ref >=60)

## 2023-05-22 LAB — URIC ACID: Uric Acid, Serum: 7.5 mg/dL (ref 4.0–8.0)

## 2023-05-22 LAB — B12 AND FOLATE PANEL
Folate: 14.8 ng/mL
Vitamin B-12: 609 pg/mL (ref 200–1100)

## 2023-05-22 LAB — CYCLIC CITRUL PEPTIDE ANTIBODY, IGG: Cyclic Citrullin Peptide Ab: <16 U

## 2023-05-22 LAB — MAGNESIUM: Magnesium: 2.3 mg/dL (ref 1.5–2.5)

## 2023-05-22 NOTE — Progress Notes (Signed)
Creatinine is elevated at 1.42, most likely due to diuretic use.  Magnesium normal, uric acid mildly elevated, B12 normal, folate normal, anti-CCP negative.  I will discuss results at the follow-up visit.

## 2023-05-25 ENCOUNTER — Ambulatory Visit: Payer: Medicare HMO | Attending: Cardiology

## 2023-05-25 DIAGNOSIS — Z95810 Presence of automatic (implantable) cardiac defibrillator: Secondary | ICD-10-CM

## 2023-05-25 DIAGNOSIS — I5032 Chronic diastolic (congestive) heart failure: Secondary | ICD-10-CM

## 2023-05-29 ENCOUNTER — Telehealth: Payer: Self-pay

## 2023-05-29 DIAGNOSIS — I4891 Unspecified atrial fibrillation: Secondary | ICD-10-CM | POA: Diagnosis not present

## 2023-05-29 DIAGNOSIS — Z860101 Personal history of adenomatous and serrated colon polyps: Secondary | ICD-10-CM | POA: Diagnosis not present

## 2023-05-29 NOTE — Progress Notes (Signed)
EPIC Encounter for ICM Monitoring  Patient Name: [name] is a 72 y.o. male Date: 05/29/2023 Primary Care Physican: Donita Brooks, MD Primary Cardiologist: Shirlee Latch Electrophysiologist: Townsend Roger Pacing: >99%        12/02/2021 Office Weight: 267 lbs 12/10/2021 Office Weight: 276 lbs 12/17/2021 Weight:  Has not weighed since last OV 09/12/2022 Office Weight: 285 lbs 02/04/2023 Weight: 278 lbs 05/29/2023 Weight: 269 lbs                                                          AT/AF Burden: 100% (taking Xarelto)    Spoke with patient and heart failure questions reviewed.  Transmission results reviewed.  Pt asymptomatic for fluid accumulation.  He was hospitalized with severe gout and did not take diuretic for that week.   He has resumed Torsemide and steroids has resolved gout at this time.    CorVue thoracic impedance suggesting normal fluid levels with the exception of possible fluid accumulation from 10/25-11/5.   Prescribed: Torsemide 20 mg take 1 tablet (20 mg total) daily.   Klor Con 20 mEq take take 1 tablet daily  Spironolactone 25 mg take 1 tablet daily   Labs: 05/19/2023 Creatinine 1.42, BUN 29, Potassium 4.8, Sodium 140, GFR 53 04/13/2023 Creatinine 1.27, BUN 27, Potassium 5.0, Sodium 137 04/09/2023 Creatinine 1.38, BUN 31, Potassium 4.7, Sodium 135, GFR 55 02/17/2023 Creatinine 1.24, BUN 29, Potassium 4.2, Sodium 137, GFR >60 02/04/2023 Creatinine 1.30, BUN 30, Potassium 4.1, Sodium 132, GFR 59 09/29/2022 Creatinine 1.20, BUN 39, Potassium 3.8, Sodium 137, GFR >60  09/12/2022 Creatinine 1.19, BUN 28, Potassium 4.3, Sodium 138, GFR >60  01/17/2022 Creatinine 1.40, BUN 34, Potassium 4.0, Sodium 138, GFR 54 12/10/2021 Creatinine 1.33, BUN 29, Potassium 4.3, Sodium 139, GFR 58 A complete set of results can be found in Results Review.   Recommendations:  No changes and encouraged to call if experiencing any fluid symptoms.  He did not realize he had a HF clinic appt on  11/5 and will call the office to reschedule.   Follow-up plan: ICM clinic phone appointment on 07/13/2023.   91 day device clinic remote transmission 06/16/2023.     EP/Cardiology Office Visits:  Recall 08/06/2023 with Dr Lalla Brothers.   Missed 05/19/2023 with HF Clinic and not rescheduled.     Copy of ICM check sent to Dr. Lalla Brothers.     3 month ICM trend: 05/25/2023.    12-14 Month ICM trend:     Karie Soda, RN 05/29/2023 12:08 PM

## 2023-05-29 NOTE — Telephone Encounter (Signed)
   Pre-operative Risk Assessment    Patient Name: [name]  DOB: [birth year] MRN: [id number]     Request for Surgical Clearance    Procedure:   COLONOSCOPY  Date of Surgery:  Clearance 07/16/23                                 Surgeon:  DR. Dulce Sellar Surgeon's Group or Practice Name:   [facility] Phone number:  [phone] Fax number:  [phone]   Type of Clearance Requested:   - Pharmacy:  Hold Rivaroxaban (Xarelto) HOLD 3 DAYS PRIOR TO PROCEDURE?   Type of Anesthesia:   PROPOFOL    Additional requests/questions:    Signed, Michaelle Copas   05/29/2023, 4:19 PM

## 2023-05-31 NOTE — Telephone Encounter (Signed)
Patient with diagnosis of atrial fibrillation on Xarelto for anticoagulation.    Procedure:   COLONOSCOPY   Date of Surgery:  Clearance 07/16/23      CHA2DS2-VASc Score = 3   This indicates a 3.2% annual risk of stroke. The patient's score is based upon: CHF History: 1 HTN History: 1 Diabetes History: 0 Stroke History: 0 Vascular Disease History: 0 Age Score: 1 Gender Score: 0   CrCl 84 Platelet count 195  Per office protocol, patient can hold Xarelto for 2-3 days prior to procedure.   Patient will not need bridging with Lovenox (enoxaparin) around procedure.  **This guidance is not considered finalized until pre-operative APP has relayed final recommendations.**

## 2023-06-01 NOTE — Telephone Encounter (Signed)
   Patient Name: [name]  DOB: 14-Nov-1950 MRN: [id number]  Primary Cardiologist: Nanetta Batty, MD  Clinical pharmacists have reviewed the patient's past medical history, labs, and current medications as part of preoperative protocol coverage. The following recommendations have been made:   Per office protocol, patient can hold Xarelto for 2-3 days prior to procedure.   Patient will not need bridging with Lovenox (enoxaparin) around procedure.   I will route this recommendation to the requesting party via Epic fax function and remove from pre-op pool.  Please call with questions.  Napoleon Form, Leodis Rains, NP 06/01/2023, 7:44 AM

## 2023-06-03 ENCOUNTER — Other Ambulatory Visit: Payer: Self-pay | Admitting: Cardiology

## 2023-06-04 NOTE — Progress Notes (Signed)
Office Visit Note  Patient: [name]             Date of Birth: Feb 26, 1951           MRN: [id number]             PCP: Donita Brooks, MD Referring: Exie Parody Visit Date: 06/18/2023 Occupation: @GUAROCC @  Subjective:  Pain in joints  History of Present Illness: [name] is a 72 y.o. male with 0 out of 10 osteoarthritis.  He returns today after his initial visit on May 19, 2023.  He states he has been taking colchicine 0.6 mg tablet, half tablet daily without any interruption.  He has not had any flares of gout or pseudogout.  He continues to have some discomfort in his feet due to neuropathy.  He has been taking gabapentin occasionally.  He also has intermittent radiculopathy from his lower back to his left lower extremity.  He continues to have some discomfort in his left knee joint.  Right total knee replacement is doing well.  He also has some discomfort in the left [facility] joint.  Dr. Nelva Nay offered left [facility] surgery in the past.    Activities of Daily Living:  Patient reports morning stiffness for 30 minutes.   Patient Reports nocturnal pain.  Difficulty dressing/grooming: Denies Difficulty climbing stairs: Reports Difficulty getting out of chair: Denies Difficulty using hands for taps, buttons, cutlery, and/or writing: Reports  Review of Systems  Constitutional:  Negative for fatigue.  HENT:  Negative for mouth sores and mouth dryness.   Eyes:  Negative for dryness.  Respiratory:  Negative for shortness of breath.   Cardiovascular:  Negative for chest pain and palpitations.  Gastrointestinal:  Negative for blood in stool, constipation and diarrhea.  Endocrine: Negative for increased urination.  Genitourinary:  Negative for involuntary urination.  Musculoskeletal:  Positive for joint pain, joint pain, myalgias, muscle weakness, morning stiffness and myalgias. Negative for gait problem, joint swelling and muscle tenderness.  Skin:  Negative for  color change, rash, hair loss and sensitivity to sunlight.  Allergic/Immunologic: Negative for susceptible to infections.  Neurological:  Negative for dizziness and headaches.  Hematological:  Negative for swollen glands.  Psychiatric/Behavioral:  Negative for depressed mood and sleep disturbance. The patient is not nervous/anxious.     PMFS History:  Patient Active Problem List   Diagnosis Date Noted   Carpal tunnel syndrome 11/28/2020   Colon cancer screening 11/28/2020   Constipation 11/28/2020   Essential hypertension 11/28/2020   History of colonic polyps 11/28/2020   Presence of prosthetic heart valve 11/28/2020   Primary localized osteoarthritis of pelvic region and thigh 11/28/2020   Chronic systolic heart failure (HCC) 07/11/2019   Acquired thrombophilia (HCC) 05/25/2019   Atypical atrial flutter (HCC)    S/P aortic valve repair 05/18/2018   S/P mitral valve repair 05/18/2018   S/P Maze operation for atrial fibrillation 05/18/2018   Acute on chronic combined systolic (congestive) and diastolic (congestive) heart failure (HCC) 04/21/2018   Mitral regurgitation 04/21/2018   Aortic insufficiency 04/21/2018   Obesity (BMI 30-39.9)    Chronic diastolic congestive heart failure (HCC)    Hypertensive cardiovascular disease 07/20/2013   Left atrial enlargement 07/20/2013   Obstructive sleep apnea 05/11/2013   Persistent atrial fibrillation (HCC)    Instability of prosthetic hip (HCC) 04/09/2012   Pain due to total hip replacement (HCC) 10/08/2011    Past Medical History:  Diagnosis Date   AICD (automatic cardioverter/defibrillator)  present    Aortic insufficiency 04/21/2018   Aortic insufficiency    Arthritis    "joints" (09/26/2013)   Atrial enlargement, left    CHF (congestive heart failure) (HCC)    Essential hypertension 11/28/2020   Hearing aid worn    both ears   Mitral regurgitation 04/21/2018   Obesity    OSA on CPAP    "mask adjusts to what I need" (09/26/2013)    Persistent atrial fibrillation (HCC)    cardioversion 06/06/13   PONV (postoperative nausea and vomiting)    Rotator cuff tear, right dec 2012   physical therapy done, decreased strength   S/P aortic valve repair 05/18/2018   Complex valvuloplasty including plication of right coronary leaflet and 21 mm Biostable HAART annuloplasty ring   S/P Maze operation for atrial fibrillation 05/18/2018   Complete bilateral atrial lesion set using bipolar radiofrequency and cryothermy ablation with clipping of LA appendage   S/P mitral valve repair 05/18/2018   Complex valvuloplasty including artificial Gore-tex neochord placement x4 and 30 mm Sorin Memo 4D ring annuloplasty   Wears glasses     Family History  Problem Relation Age of Onset   Heart Problems Mother    Arthritis Mother    Cancer Sister    Other Sister        bilateral hip replacements   Other Other        mother had a pacemaker   Healthy Son    Healthy Son    Healthy Daughter    Past Surgical History:  Procedure Laterality Date   AORTIC VALVE REPAIR N/A 05/18/2018   Procedure: AORTIC VALVE REPAIR using HAART 300 Aortic Annuloplasty Device size 21mm;  Surgeon: Purcell Nails, MD;  Location: [facility] OR;  Service: Open Heart Surgery;  Laterality: N/A;   ATRIAL FIBRILLATION ABLATION N/A 04/26/2019   Procedure: ATRIAL FIBRILLATION ABLATION;  Surgeon: Hillis Range, MD;  Location: [facility] INVASIVE CV LAB;  Service: Cardiovascular;  Laterality: N/A;   AV NODE ABLATION N/A 12/20/2019   Procedure: AV NODE ABLATION;  Surgeon: Hillis Range, MD;  Location: [facility];  Service: Cardiovascular;  Laterality: N/A;   BIV ICD INSERTION CRT-D N/A 08/23/2019   Procedure: BIV ICD INSERTION CRT-D;  Surgeon: Hillis Range, MD;  Location: [facility] LAB;  Service: Cardiovascular;  Laterality: N/A;   CARDIOVERSION N/A 06/06/2013   Procedure: CARDIOVERSION;  Surgeon: Thurmon Fair, MD;  Location: [facility] ENDOSCOPY;  Service: Cardiovascular;   Laterality: N/A;   CARDIOVERSION N/A 09/28/2013   Procedure: CARDIOVERSION BEDSIDE;  Surgeon: Peter M Swaziland, MD;  Location: [facility] OR;  Service: Cardiovascular;  Laterality: N/A;   CARDIOVERSION N/A 11/30/2018   Procedure: CARDIOVERSION;  Surgeon: Lars Masson, MD;  Location: [facility] ENDOSCOPY;  Service: Cardiovascular;  Laterality: N/A;   CARDIOVERSION N/A 03/10/2019   Procedure: CARDIOVERSION;  Surgeon: Little Ishikawa, MD;  Location: [facility] ENDOSCOPY;  Service: Endoscopy;  Laterality: N/A;   CARDIOVERSION N/A 12/20/2021   Procedure: CARDIOVERSION;  Surgeon: Laurey Morale, MD;  Location: [facility] ENDOSCOPY;  Service: Cardiovascular;  Laterality: N/A;   CARPAL TUNNEL RELEASE Right 08/08/2013   Procedure: RIGHT CARPAL TUNNEL RELEASE;  Surgeon: Nicki Reaper, MD;  Location: [facility];  Service: Orthopedics;  Laterality: Right;   CARPAL TUNNEL RELEASE Left 07/14/2006   CLIPPING OF ATRIAL APPENDAGE  05/18/2018   Procedure: CLIPPING OF ATRIAL APPENDAGE using AtriCure Clip size 45;  Surgeon: Purcell Nails, MD;  Location: [facility] OR;  Service: Open Heart Surgery;;  KNEE ARTHROSCOPY Right 03/14/1989   MAZE N/A 05/18/2018   Procedure: MAZE;  Surgeon: Purcell Nails, MD;  Location: [facility] OR;  Service: Open Heart Surgery;  Laterality: N/A;   MITRAL VALVE REPAIR N/A 05/18/2018   Procedure: MITRAL VALVE REPAIR (MVR) using 4D Memo Ring size 30;  Surgeon: Purcell Nails, MD;  Location: [facility] OR;  Service: Open Heart Surgery;  Laterality: N/A;   PACEMAKER INSERTION     RIGHT/LEFT HEART CATH AND CORONARY ANGIOGRAPHY N/A 04/21/2018   Procedure: RIGHT/LEFT HEART CATH AND CORONARY ANGIOGRAPHY;  Surgeon: Yvonne Kendall, MD;  Location: [facility]VAS[facility] CV LAB;  Service: Cardiovascular;  Laterality: N/A;   SHOULDER OPEN ROTATOR CUFF REPAIR Left 03/14/1989   SHOULDER OPEN ROTATOR CUFF REPAIR Right 11/12/2007   TEE WITHOUT CARDIOVERSION N/A 04/21/2018   Procedure: TRANSESOPHAGEAL ECHOCARDIOGRAM (TEE);   Surgeon: Jake Bathe, MD;  Location: [facility] ENDOSCOPY;  Service: Cardiovascular;  Laterality: N/A;   TEE WITHOUT CARDIOVERSION N/A 05/18/2018   Procedure: TRANSESOPHAGEAL ECHOCARDIOGRAM (TEE);  Surgeon: Purcell Nails, MD;  Location: [facility] OR;  Service: Open Heart Surgery;  Laterality: N/A;   TONSILLECTOMY  1950's   TOTAL HIP ARTHROPLASTY Left 07/14/2008   TOTAL HIP ARTHROPLASTY Right 05/29/2020   Procedure: TOTAL HIP ARTHROPLASTY ANTERIOR APPROACH;  Surgeon: Sheral Apley, MD;  Location: [facility] ORS;  Service: Orthopedics;  Laterality: Right;   TOTAL HIP REVISION Left 10/08/2011   TOTAL HIP REVISION  04/09/2012   Procedure: TOTAL HIP REVISION;  Surgeon: Loanne Drilling, MD;  Location: [facility] ORS;  Service: Orthopedics;  Laterality: Left;  Left Hip Acetabular Revision vs Constrained Liner   TOTAL KNEE ARTHROPLASTY Right 07/14/2004   TRIGGER FINGER RELEASE Right 08/08/2013   Procedure: RELEASE STENOSING TENOSYNOVITIS RIGHT THUMB;  Surgeon: Nicki Reaper, MD;  Location: [facility];  Service: Orthopedics;  Laterality: Right;   Social History   Social History Narrative   Pt lives in [place] with spouse.  Leadership training and behavioral safety training.   Immunization History  Administered Date(s) Administered   Influenza Split 04/13/2009, 05/03/2013, 07/17/2015   Influenza,inj,Quad PF,6+ Mos 05/02/2010, 04/22/2012, 08/13/2017   Influenza,inj,quad, With Preservative 05/15/2014   Influenza-Unspecified 04/13/2013   Td 04/13/2004   Tdap 05/15/2014     Objective: Vital Signs: BP 112/70 (BP Location: Left Arm, Patient Position: Sitting, Cuff Size: Normal)   Pulse 62   Resp 14   Ht 5\' 9"  (1.753 m)   Wt 274 lb (124.3 kg)   BMI 40.46 kg/m    Physical Exam Vitals and nursing note reviewed.  Constitutional:      Appearance: He is well-developed.  HENT:     Head: Normocephalic and atraumatic.  Eyes:     Conjunctiva/sclera: Conjunctivae normal.     Pupils: Pupils are equal,  round, and reactive to light.  Cardiovascular:     Rate and Rhythm: Normal rate and regular rhythm.     Heart sounds: Normal heart sounds.  Pulmonary:     Effort: Pulmonary effort is normal.     Breath sounds: Normal breath sounds.  Abdominal:     General: Bowel sounds are normal.     Palpations: Abdomen is soft.  Musculoskeletal:     Cervical back: Normal range of motion and neck supple.  Skin:    General: Skin is warm and dry.     Capillary Refill: Capillary refill takes less than 2 seconds.  Neurological:     Mental Status: He is alert and oriented to person, place, and  time.  Psychiatric:        Behavior: Behavior normal.      Musculoskeletal Exam: Patient had good range of motion of the cervical spine.  There was no tenderness over thoracic or lumbar spine.  He had limited internal rotation of the shoulder joints without discomfort.  Elbow joints were in good range of motion.  There was mild tenderness over left CMC joint.  There was no synovitis over wrist joints, MCPs or PIPs.  Bilateral PIP and DIP thickening was noted.  Hip joints were replaced and were in good range of motion.  Right knee joint was also replaced.  Left knee joint was in good range of motion without any warmth swelling or effusion.  There was no tenderness over ankles or MTPs.  CDAI Exam: CDAI Score: -- Patient Global: --; Provider Global: -- Swollen: --; Tender: -- Joint Exam 06/18/2023   No joint exam has been documented for this visit   There is currently no information documented on the homunculus. Go to the Rheumatology activity and complete the homunculus joint exam.  Investigation: No additional findings.  Imaging: CUP PACEART REMOTE DEVICE CHECK  Result Date: 06/16/2023 Scheduled remote reviewed. Normal device function.  Permanent AF, controlled rates, Xarelto per PA report Next remote 91 days. LA CVRS   Recent Labs: Lab Results  Component Value Date   WBC 12.0 (H) 04/28/2023   HGB 15.6  04/28/2023   PLT 195 04/28/2023   NA 140 05/19/2023   K 4.8 05/19/2023   CL 102 05/19/2023   CO2 30 05/19/2023   GLUCOSE 102 (H) 05/19/2023   BUN 29 (H) 05/19/2023   CREATININE 1.42 (H) 05/19/2023   BILITOT 0.6 05/19/2023   ALKPHOS 70 06/17/2020   AST 18 05/19/2023   ALT 21 05/19/2023   PROT 6.9 05/19/2023   ALBUMIN 3.8 06/17/2020   CALCIUM 9.9 05/19/2023   GFRAA 58 (L) 03/23/2020   April 13, 2023 ANA 1: 80 NH, 1: 40 cytoplasmic, RF negative, ESR 11, uric acid 6.4 April 28, 2023 uric acid 7.0, ESR 2, CRP 15.8 May 19, 2023 B12 normal, folate normal, uric acid 7.5, magnesium 2.3, anti-CCP<16  Speciality Comments: No specialty comments available.  Procedures:  No procedures performed Allergies: Metoprolol tartrate and Oxycodone   Assessment / Plan:     Visit Diagnoses: Polyarthralgia - Patient has been experiencing pain and discomfort in multiple joints over the years.  All autoimmune workup negative.  Lab results were reviewed with the patient.  He gives history of intermittent swelling.  He has not noticed any joint swelling since has been on colchicine 0.3 mg daily.  Chronic inflammatory arthritis - History of intermittent swelling in various joints which responds to prednisone taper.  He had no swelling on colchicine 0.3 mg daily.  We had a detailed discussion regarding pseudogout.  Based on his x-rays he has chondrocalcinosis and the clinical features of pseudogout.  I am uncertain if he has gout overlap.  His uric acid has been in the upper limits of normal.  He is also trying weight loss.  Avoidance of red meat and shellfish was discussed with the patient.  If he continues to have flares on colchicine then allopurinol could be added later.  Patient would like to get prescriptions through Dr. Tanya Nones and wants to come back only if needed.  Chondrocalcinosis - Noted in the left knee joint x-rays.  X-ray findings were reviewed with the patient.  Chronic pain of both  shoulders - According the  patient he was advised to total shoulder replacement by orthopedics.  He had limited and painful internal rotation.  Pain in both hands - Right wrist synovitis was noted at the last visit which is resolved now.  X-rays showed right second MCP narrowing and osteoarthritic changes.  Which could be consistent with pseudogout.  Status post total replacement of both hips - RTHR11/21 by Dr. Eulah Pont, [facility] 09/13 by Dr. Despina Hick.  Well.  Status post total knee replacement, right - 2006 by Dr. Eulah Pont.  Doing well.  Primary osteoarthritis of left knee - Chronic pain.  Patient states that he will get left total knee replacement in the future.  X-rays showed moderate osteoarthritis (lateral compartment narrowing) and moderate chondromalacia patella.  Primary osteoarthritis of both feet - Bilateral hammertoes.  History of recurrent swelling.  X-rays were suggestive of osteoarthritis.  Neuropathy - History of numbness in bilateral feet.  B12 and folate normal.  Patient has good response to gabapentin.  He takes gabapentin on a as needed basis.  Degeneration of intervertebral disc of lumbar region without discogenic back pain or lower extremity pain -he continues to have some lower back pain and intermittent radiculopathy to the left lower extremity.  MRI from September 2024 showed multilevel spondylosis and severe spinal canal stenosis.  Other medical problems are listed as follows:  Chronic systolic heart failure (HCC)  Chronic diastolic congestive heart failure (HCC)  Atypical atrial flutter (HCC)  Persistent atrial fibrillation (HCC)  Presence of combination internal cardiac defibrillator (ICD) and pacemaker  Essential hypertension  S/P aortic valve repair  S/P Maze operation for atrial fibrillation  S/P mitral valve repair  History of colonic polyps  Obstructive sleep apnea  Obesity (BMI 30-39.9)  Orders: No orders of the defined types were placed in this  encounter.  No orders of the defined types were placed in this encounter.     Follow-Up Instructions: Return for Pseudogout, Osteoarthritis.   Pollyann Savoy, MD  Note - This record has been created using Animal nutritionist.  Chart creation errors have been sought, but may not always  have been located. Such creation errors do not reflect on  the standard of medical care.

## 2023-06-16 ENCOUNTER — Ambulatory Visit (INDEPENDENT_AMBULATORY_CARE_PROVIDER_SITE_OTHER): Payer: Medicare HMO

## 2023-06-16 DIAGNOSIS — I428 Other cardiomyopathies: Secondary | ICD-10-CM | POA: Diagnosis not present

## 2023-06-16 LAB — CUP PACEART REMOTE DEVICE CHECK
Battery Remaining Longevity: 42 mo
Battery Remaining Percentage: 46 %
Battery Voltage: 2.95 V
Brady Statistic AP VP Percent: 0 %
Brady Statistic AP VS Percent: 0 %
Brady Statistic AS VP Percent: 38 %
Brady Statistic AS VS Percent: 62 %
Brady Statistic RA Percent Paced: 1 %
Date Time Interrogation Session: 20241203021545
HighPow Impedance: 87 Ohm
Implantable Lead Connection Status: 753985
Implantable Lead Connection Status: 753985
Implantable Lead Connection Status: 753985
Implantable Lead Implant Date: 20210209
Implantable Lead Implant Date: 20210209
Implantable Lead Implant Date: 20210209
Implantable Lead Location: 753858
Implantable Lead Location: 753859
Implantable Lead Location: 753860
Implantable Pulse Generator Implant Date: 20210209
Lead Channel Impedance Value: 430 Ohm
Lead Channel Impedance Value: 500 Ohm
Lead Channel Impedance Value: 880 Ohm
Lead Channel Pacing Threshold Amplitude: 0.75 V
Lead Channel Pacing Threshold Amplitude: 1 V
Lead Channel Pacing Threshold Amplitude: 1.75 V
Lead Channel Pacing Threshold Pulse Width: 0.5 ms
Lead Channel Pacing Threshold Pulse Width: 0.5 ms
Lead Channel Pacing Threshold Pulse Width: 0.5 ms
Lead Channel Sensing Intrinsic Amplitude: 0.4 mV
Lead Channel Sensing Intrinsic Amplitude: 12 mV
Lead Channel Setting Pacing Amplitude: 2 V
Lead Channel Setting Pacing Amplitude: 2.5 V
Lead Channel Setting Pacing Amplitude: 2.5 V
Lead Channel Setting Pacing Pulse Width: 0.5 ms
Lead Channel Setting Pacing Pulse Width: 0.5 ms
Lead Channel Setting Sensing Sensitivity: 0.5 mV
Pulse Gen Serial Number: 111006442
Zone Setting Status: 755011

## 2023-06-18 ENCOUNTER — Encounter: Payer: Self-pay | Admitting: Rheumatology

## 2023-06-18 ENCOUNTER — Ambulatory Visit: Payer: Medicare HMO | Attending: Rheumatology | Admitting: Rheumatology

## 2023-06-18 VITALS — BP 112/70 | HR 62 | Resp 14 | Ht 69.0 in | Wt 274.0 lb

## 2023-06-18 DIAGNOSIS — Z96643 Presence of artificial hip joint, bilateral: Secondary | ICD-10-CM | POA: Diagnosis not present

## 2023-06-18 DIAGNOSIS — M1712 Unilateral primary osteoarthritis, left knee: Secondary | ICD-10-CM

## 2023-06-18 DIAGNOSIS — G4733 Obstructive sleep apnea (adult) (pediatric): Secondary | ICD-10-CM

## 2023-06-18 DIAGNOSIS — M79641 Pain in right hand: Secondary | ICD-10-CM

## 2023-06-18 DIAGNOSIS — Z96651 Presence of right artificial knee joint: Secondary | ICD-10-CM

## 2023-06-18 DIAGNOSIS — I5032 Chronic diastolic (congestive) heart failure: Secondary | ICD-10-CM

## 2023-06-18 DIAGNOSIS — G629 Polyneuropathy, unspecified: Secondary | ICD-10-CM | POA: Diagnosis not present

## 2023-06-18 DIAGNOSIS — Z95810 Presence of automatic (implantable) cardiac defibrillator: Secondary | ICD-10-CM

## 2023-06-18 DIAGNOSIS — M19071 Primary osteoarthritis, right ankle and foot: Secondary | ICD-10-CM

## 2023-06-18 DIAGNOSIS — M1380 Other specified arthritis, unspecified site: Secondary | ICD-10-CM

## 2023-06-18 DIAGNOSIS — E669 Obesity, unspecified: Secondary | ICD-10-CM

## 2023-06-18 DIAGNOSIS — I5022 Chronic systolic (congestive) heart failure: Secondary | ICD-10-CM

## 2023-06-18 DIAGNOSIS — I484 Atypical atrial flutter: Secondary | ICD-10-CM

## 2023-06-18 DIAGNOSIS — M2550 Pain in unspecified joint: Secondary | ICD-10-CM | POA: Diagnosis not present

## 2023-06-18 DIAGNOSIS — M19072 Primary osteoarthritis, left ankle and foot: Secondary | ICD-10-CM

## 2023-06-18 DIAGNOSIS — M25511 Pain in right shoulder: Secondary | ICD-10-CM

## 2023-06-18 DIAGNOSIS — Z8679 Personal history of other diseases of the circulatory system: Secondary | ICD-10-CM

## 2023-06-18 DIAGNOSIS — M1990 Unspecified osteoarthritis, unspecified site: Secondary | ICD-10-CM | POA: Diagnosis not present

## 2023-06-18 DIAGNOSIS — G8929 Other chronic pain: Secondary | ICD-10-CM

## 2023-06-18 DIAGNOSIS — M25512 Pain in left shoulder: Secondary | ICD-10-CM

## 2023-06-18 DIAGNOSIS — M1120 Other chondrocalcinosis, unspecified site: Secondary | ICD-10-CM

## 2023-06-18 DIAGNOSIS — I10 Essential (primary) hypertension: Secondary | ICD-10-CM

## 2023-06-18 DIAGNOSIS — M51369 Other intervertebral disc degeneration, lumbar region without mention of lumbar back pain or lower extremity pain: Secondary | ICD-10-CM

## 2023-06-18 DIAGNOSIS — I4819 Other persistent atrial fibrillation: Secondary | ICD-10-CM

## 2023-06-18 DIAGNOSIS — Z860100 Personal history of colon polyps, unspecified: Secondary | ICD-10-CM

## 2023-06-18 DIAGNOSIS — Z9889 Other specified postprocedural states: Secondary | ICD-10-CM

## 2023-06-18 DIAGNOSIS — M79642 Pain in left hand: Secondary | ICD-10-CM

## 2023-07-09 DIAGNOSIS — G4733 Obstructive sleep apnea (adult) (pediatric): Secondary | ICD-10-CM | POA: Diagnosis not present

## 2023-07-13 ENCOUNTER — Ambulatory Visit: Payer: Medicare HMO | Attending: Cardiology

## 2023-07-13 DIAGNOSIS — I5032 Chronic diastolic (congestive) heart failure: Secondary | ICD-10-CM | POA: Diagnosis not present

## 2023-07-13 DIAGNOSIS — Z95810 Presence of automatic (implantable) cardiac defibrillator: Secondary | ICD-10-CM

## 2023-07-16 ENCOUNTER — Telehealth: Payer: Self-pay

## 2023-07-16 NOTE — Telephone Encounter (Signed)
 Remote ICM transmission received.  Attempted call to patient regarding ICM remote transmission and left detailed message per DPR.  Left ICM phone number and advised to return call for any fluid symptoms or questions. Next ICM remote transmission scheduled 07/20/2023.

## 2023-07-17 DIAGNOSIS — M1712 Unilateral primary osteoarthritis, left knee: Secondary | ICD-10-CM | POA: Diagnosis not present

## 2023-07-17 DIAGNOSIS — M25561 Pain in right knee: Secondary | ICD-10-CM | POA: Diagnosis not present

## 2023-07-20 ENCOUNTER — Ambulatory Visit: Payer: Medicare HMO | Attending: Cardiology

## 2023-07-20 DIAGNOSIS — I5032 Chronic diastolic (congestive) heart failure: Secondary | ICD-10-CM

## 2023-07-20 DIAGNOSIS — Z95810 Presence of automatic (implantable) cardiac defibrillator: Secondary | ICD-10-CM

## 2023-07-22 ENCOUNTER — Encounter: Payer: Medicare HMO | Admitting: Internal Medicine

## 2023-07-22 NOTE — Progress Notes (Signed)
 EPIC Encounter for ICM Monitoring  Patient Name: [name] is a 73 y.o. male Date: 07/22/2023 Primary Care Physican: Duanne Butler DASEN, MD Primary Cardiologist: Rolan Electrophysiologist: Cindie Pore Pacing: >99%        12/02/2021 Office Weight: 267 lbs 12/10/2021 Office Weight: 276 lbs 12/17/2021 Weight:  Has not weighed since last OV 09/12/2022 Office Weight: 285 lbs 02/04/2023 Weight: 278 lbs 05/29/2023 Weight: 269 lbs                                                          AT/AF Burden: 100% (taking Xarelto )    Spoke with patient and heart failure questions reviewed.  Transmission results reviewed.  Pt asymptomatic for fluid accumulation.  He was traveling from 12/7 - 12/18 which may contributed to decreased impedance.  He is unsure why the report would suggest possible dryness since he drinks plenty of fluids daily.     CorVue thoracic impedance suggesting fluid levels returned to normal.     Prescribed: Torsemide  20 mg take 1 tablet (20 mg total) daily.   Klor Con 20 mEq take take 1 tablet daily  Spironolactone  25 mg take 1 tablet daily   Labs: 05/19/2023 Creatinine 1.42, BUN 29, Potassium 4.8, Sodium 140, GFR 53 04/13/2023 Creatinine 1.27, BUN 27, Potassium 5.0, Sodium 137 04/09/2023 Creatinine 1.38, BUN 31, Potassium 4.7, Sodium 135, GFR 55 02/17/2023 Creatinine 1.24, BUN 29, Potassium 4.2, Sodium 137, GFR >60 02/04/2023 Creatinine 1.30, BUN 30, Potassium 4.1, Sodium 132, GFR 59 09/29/2022 Creatinine 1.20, BUN 39, Potassium 3.8, Sodium 137, GFR >60  09/12/2022 Creatinine 1.19, BUN 28, Potassium 4.3, Sodium 138, GFR >60  01/17/2022 Creatinine 1.40, BUN 34, Potassium 4.0, Sodium 138, GFR 54 12/10/2021 Creatinine 1.33, BUN 29, Potassium 4.3, Sodium 139, GFR 58 A complete set of results can be found in Results Review.   Recommendations:  No changes and encouraged to call if experiencing any fluid symptoms.   Follow-up plan: ICM clinic phone appointment on 08/17/2023.   91  day device clinic remote transmission 09/15/2023.     EP/Cardiology Office Visits:  Recall 08/06/2023 with Dr Cindie or APP.   08/11/2023 with Dr Rolan.    Copy of ICM check sent to Dr. Cindie.   3 month ICM trend: 07/21/2023.    12-14 Month ICM trend:     [name], RN 07/22/2023 2:09 PM

## 2023-07-27 ENCOUNTER — Ambulatory Visit (INDEPENDENT_AMBULATORY_CARE_PROVIDER_SITE_OTHER): Payer: Medicare HMO | Admitting: Family Medicine

## 2023-07-27 VITALS — BP 124/64 | HR 80 | Ht 69.0 in | Wt 275.6 lb

## 2023-07-27 DIAGNOSIS — G629 Polyneuropathy, unspecified: Secondary | ICD-10-CM | POA: Diagnosis not present

## 2023-07-27 DIAGNOSIS — I4819 Other persistent atrial fibrillation: Secondary | ICD-10-CM | POA: Diagnosis not present

## 2023-07-27 DIAGNOSIS — I509 Heart failure, unspecified: Secondary | ICD-10-CM | POA: Diagnosis not present

## 2023-07-27 DIAGNOSIS — M1120 Other chondrocalcinosis, unspecified site: Secondary | ICD-10-CM | POA: Diagnosis not present

## 2023-07-27 MED ORDER — HYDROCODONE-ACETAMINOPHEN 5-325 MG PO TABS: 2.0000 | ORAL_TABLET | Freq: Three times a day (TID) | ORAL | 0 refills | Status: DC | PRN

## 2023-07-27 NOTE — Progress Notes (Signed)
 Subjective:    Patient ID: [name], male    DOB: 1951-01-20, 73 y.o.   MRN: [id number]  Foot Pain   04/13/23 Patient is a very pleasant 73 year old Caucasian gentleman here today to establish care.  However he was also seen in the hospital on September 26 for cellulitis around the left ankle.  The patient states that he has been battling pain in his left ankle for 3 weeks.  The pain was so severe on the 26 that he could not even walk.  He had to go to the hospital.  In the hospital they did x-rays that were unremarkable to the left ankle did show mild arctic changes.  A CT was significant for a white cell count 14.  He was given Dalvance  in the hospital.  The pain improved slightly over the next day and then returned today where he is unable to put weight on his ankle.  He is having trouble sleeping at night.  The left ankle is mildly swollen.  There is slight erythema.  However his skin is exquisitely tender to touch.  The presentation almost appears like gout.  The pain is far out of proportion to the exam.  There is no warmth. Past medical history is significant for congestive heart failure status post defibrillator, atrial fibrillation status post Maze procedure, hypertension, hyperlipidemia, and bowel care, mitral valve repair.  He is also been dealing with low back pain and right-sided sciatica since May.  MRI is pending but per my read appears to show a bulging disc between L2 and L3 impinging upon the spinal cord.  At that time, my plan was: Patient's presentation suggest autoimmune arthritis versus gout versus septic arthritis.  Obtain a CBC to evaluate his white blood cell count, check a sed rate, rheumatoid factor, and ANA.  I will also check a uric acid level.  I will start the patient on doxycycline  100 mg twice daily for 7 days given the mild improvement seen after the Dalvance  however that should have been sufficient for 1 week.  If sed rate and uric acid level are high on the  patient and use of Cetaphil mediately followed by 1 pill in 1 hour pain.  I will give patient with hydrocodone  to help him sleep at night.  Consider a prednisone  taper pack if pain persist and uric acid level is extremely high.  Await results of lab work.  Blood pressure today is outstanding at 126/68.  Patient is currently on Entresto , spironolactone , and carvedilol  along with Farxiga  indicating maximum medical therapy for his congestive heart failure.  He is appropriately anticoagulated with Xarelto .  He is unable to use NSAIDs for that reason.  07/27/23 Rheumatology feels the patient is dealing with pseudogout.  He is currently taking 0.6 mg of colchicine  daily.  This seems to be working well at preventing exacerbations.  Unfortunately he continues to suffer with severe left knee pain due to bone-on-bone osteoarthritis.  He also deals with severe neuropathic pain in both feet every night due to peripheral neuropathy.  He also has occasional left-sided sciatica due to degenerative disc disease.  This is complicated by the fact he has chronic kidney disease stage IIIa.  He is also on anticoagulation for atrial fibrillation and has declined.  He agrees and consents.  He is using hydrocodone  1-2 times a month for breakthrough pain in addition to the colchicine .  He also is using gabapentin  sporadically for nerve pain however he does not see that it  helps very much Past Medical History:  Diagnosis Date   AICD (automatic cardioverter/defibrillator) present    Aortic insufficiency 04/21/2018   Aortic insufficiency    Arthritis    joints (09/26/2013)   Atrial enlargement, left    CHF (congestive heart failure) (HCC)    Essential hypertension 11/28/2020   Hearing aid worn    both ears   Mitral regurgitation 04/21/2018   Obesity    OSA on CPAP    mask adjusts to what I need (09/26/2013)   Persistent atrial fibrillation (HCC)    cardioversion 06/06/13   PONV (postoperative nausea and vomiting)     Rotator cuff tear, right dec 2012   physical therapy done, decreased strength   S/P aortic valve repair 05/18/2018   Complex valvuloplasty including plication of right coronary leaflet and 21 mm Biostable HAART annuloplasty ring   S/P Maze operation for atrial fibrillation 05/18/2018   Complete bilateral atrial lesion set using bipolar radiofrequency and cryothermy ablation with clipping of LA appendage   S/P mitral valve repair 05/18/2018   Complex valvuloplasty including artificial Gore-tex neochord placement x4 and 30 mm Sorin Memo 4D ring annuloplasty   Wears glasses    Past Surgical History:  Procedure Laterality Date   AORTIC VALVE REPAIR N/A 05/18/2018   Procedure: AORTIC VALVE REPAIR using HAART 300 Aortic Annuloplasty Device size 21mm;  Surgeon: Dusty Sudie DEL, MD;  Location: [facility] OR;  Service: Open Heart Surgery;  Laterality: N/A;   ATRIAL FIBRILLATION ABLATION N/A 04/26/2019   Procedure: ATRIAL FIBRILLATION ABLATION;  Surgeon: Kelsie Agent, MD;  Location: [facility];  Service: Cardiovascular;  Laterality: N/A;   AV NODE ABLATION N/A 12/20/2019   Procedure: AV NODE ABLATION;  Surgeon: Kelsie Agent, MD;  Location: [facility];  Service: Cardiovascular;  Laterality: N/A;   BIV ICD INSERTION CRT-D N/A 08/23/2019   Procedure: BIV ICD INSERTION CRT-D;  Surgeon: Kelsie Agent, MD;  Location: [facility];  Service: Cardiovascular;  Laterality: N/A;   CARDIOVERSION N/A 06/06/2013   Procedure: CARDIOVERSION;  Surgeon: Jerel Balding, MD;  Location: [facility]OS[facility];  Service: Cardiovascular;  Laterality: N/A;   CARDIOVERSION N/A 09/28/2013   Procedure: CARDIOVERSION BEDSIDE;  Surgeon: Peter M Jordan, MD;  Location: [facility] OR;  Service: Cardiovascular;  Laterality: N/A;   CARDIOVERSION N/A 11/30/2018   Procedure: CARDIOVERSION;  Surgeon: Maranda Leim DEL, MD;  Location: [facility] ENDOSCOPY;  Service: Cardiovascular;  Laterality: N/A;   CARDIOVERSION N/A 03/10/2019   Procedure:  CARDIOVERSION;  Surgeon: Kate Lonni CROME, MD;  Location: [facility] ENDOSCOPY;  Service: Endoscopy;  Laterality: N/A;   CARDIOVERSION N/A 12/20/2021   Procedure: CARDIOVERSION;  Surgeon: Rolan Ezra RAMAN, MD;  Location: [facility] ENDOSCOPY;  Service: Cardiovascular;  Laterality: N/A;   CARPAL TUNNEL RELEASE Right 08/08/2013   Procedure: RIGHT CARPAL TUNNEL RELEASE;  Surgeon: Arley JONELLE Curia, MD;  Location: [facility];  Service: Orthopedics;  Laterality: Right;   CARPAL TUNNEL RELEASE Left 07/14/2006   CLIPPING OF ATRIAL APPENDAGE  05/18/2018   Procedure: CLIPPING OF ATRIAL APPENDAGE using AtriCure Clip size 45;  Surgeon: Dusty Sudie DEL, MD;  Location: [facility] OR;  Service: Open Heart Surgery;;   KNEE ARTHROSCOPY Right 03/14/1989   MAZE N/A 05/18/2018   Procedure: MAZE;  Surgeon: Dusty Sudie DEL, MD;  Location: [facility] OR;  Service: Open Heart Surgery;  Laterality: N/A;   MITRAL VALVE REPAIR N/A 05/18/2018   Procedure: MITRAL VALVE REPAIR (MVR) using 4D Memo Ring size 30;  Surgeon: Dusty Sudie  H, MD;  Location: [facility] OR;  Service: Open Heart Surgery;  Laterality: N/A;   PACEMAKER INSERTION     RIGHT/LEFT HEART CATH AND CORONARY ANGIOGRAPHY N/A 04/21/2018   Procedure: RIGHT/LEFT HEART CATH AND CORONARY ANGIOGRAPHY;  Surgeon: Mady Bruckner, MD;  Location: [facility];  Service: Cardiovascular;  Laterality: N/A;   SHOULDER OPEN ROTATOR CUFF REPAIR Left 03/14/1989   SHOULDER OPEN ROTATOR CUFF REPAIR Right 11/12/2007   TEE WITHOUT CARDIOVERSION N/A 04/21/2018   Procedure: TRANSESOPHAGEAL ECHOCARDIOGRAM (TEE);  Surgeon: Jeffrie Oneil BROCKS, MD;  Location: [facility] ENDOSCOPY;  Service: Cardiovascular;  Laterality: N/A;   TEE WITHOUT CARDIOVERSION N/A 05/18/2018   Procedure: TRANSESOPHAGEAL ECHOCARDIOGRAM (TEE);  Surgeon: Dusty Sudie DEL, MD;  Location: [facility] OR;  Service: Open Heart Surgery;  Laterality: N/A;   TONSILLECTOMY  1950's   TOTAL HIP ARTHROPLASTY Left 07/14/2008   TOTAL HIP ARTHROPLASTY Right  05/29/2020   Procedure: TOTAL HIP ARTHROPLASTY ANTERIOR APPROACH;  Surgeon: Beverley Evalene BIRCH, MD;  Location: [facility] ORS;  Service: Orthopedics;  Laterality: Right;   TOTAL HIP REVISION Left 10/08/2011   TOTAL HIP REVISION  04/09/2012   Procedure: TOTAL HIP REVISION;  Surgeon: Dempsey LULLA Moan, MD;  Location: [facility] ORS;  Service: Orthopedics;  Laterality: Left;  Left Hip Acetabular Revision vs Constrained Liner   TOTAL KNEE ARTHROPLASTY Right 07/14/2004   TRIGGER FINGER RELEASE Right 08/08/2013   Procedure: RELEASE STENOSING TENOSYNOVITIS RIGHT THUMB;  Surgeon: Arley JONELLE Curia, MD;  Location: [facility];  Service: Orthopedics;  Laterality: Right;   Current Outpatient Medications on File Prior to Visit  Medication Sig Dispense Refill   acetaminophen  (TYLENOL ) 325 MG tablet Take 2 tablets (650 mg total) by mouth every 4 (four) hours as needed for headache or mild pain.     carvedilol  (COREG ) 6.25 MG tablet TAKE 1 TABLET (6.25 MG TOTAL) BY MOUTH 2 (TWO) TIMES DAILY WITH A MEAL. NEEDS FOLLOW UP APPOINTMENT 180 tablet 3   clobetasol ointment (TEMOVATE) 0.05 % Apply 1 application topically daily as needed (rash).     colchicine  0.6 MG tablet Take 1 tablet (0.6 mg total) by mouth daily. 30 tablet 3   dapagliflozin  propanediol (FARXIGA ) 10 MG TABS tablet Take 1 tablet (10 mg total) by mouth daily before breakfast. 30 tablet 11   diclofenac Sodium (VOLTAREN) 1 % GEL Apply 2 g topically daily as needed (pain).      gabapentin  (NEURONTIN ) 300 MG capsule 1 tablet Orally three     HYDROcodone -acetaminophen  (NORCO/VICODIN) 5-325 MG tablet Take 2 tablets by mouth every 8 (eight) hours as needed. 30 tablet 0   Magnesium  250 MG TABS Take 2 tablets by mouth daily.     NON FORMULARY Pt uses a cpap nightly     Potassium Chloride  ER 20 MEQ TBCR 1 tablet Orally Once a day (Patient not taking: Reported on 06/18/2023)     potassium chloride  SA (KLOR-CON  M) 20 MEQ tablet Take 1 tablet (20 mEq total) by mouth daily.  90 tablet 3   sacubitril -valsartan  (ENTRESTO ) 24-26 MG TAKE 1 TABLET BY MOUTH TWICE A DAY 60 tablet 11   spironolactone  (ALDACTONE ) 25 MG tablet TAKE 1 TABLET BY MOUTH EVERY DAY 90 tablet 3   tadalafil  (CIALIS ) 10 MG tablet Take 10 mg by mouth daily as needed for erectile dysfunction.     torsemide  (DEMADEX ) 20 MG tablet Take 20 mg by mouth daily.     XARELTO  20 MG TABS tablet TAKE 1 TABLET BY MOUTH DAILY WITH SUPPER 30  tablet 10   No current facility-administered medications on file prior to visit.   Allergies  Allergen Reactions   Metoprolol  Tartrate Shortness Of Breath    Shortness of breath, pressure in chest and back   Oxycodone  Rash and Other (See Comments)    Can tolerate Hydrocodone    Social History   Socioeconomic History   Marital status: Married    Spouse name: Not on file   Number of children: Not on file   Years of education: Not on file   Highest education level: Not on file  Occupational History   Occupation: Public Affairs Consultant  Tobacco Use   Smoking status: Never    Passive exposure: Never   Smokeless tobacco: Never  Vaping Use   Vaping status: Never Used  Substance and Sexual Activity   Alcohol use: No   Drug use: No   Sexual activity: Yes  Other Topics Concern   Not on file  Social History Narrative   Pt lives in [place] with spouse.  Leadership training and behavioral safety training.   Social Drivers of Health   Financial Resource Strain: Medium Risk (07/23/2023)   Overall Financial Resource Strain (CARDIA)    Difficulty of Paying Living Expenses: Somewhat hard  Food Insecurity: Patient Declined (07/23/2023)   Hunger Vital Sign    Worried About Running Out of Food in the Last Year: Patient declined    Ran Out of Food in the Last Year: Patient declined  Transportation Needs: Patient Declined (07/23/2023)   PRAPARE - Administrator, Civil Service (Medical): Patient declined    Lack of Transportation (Non-Medical):  Patient declined  Physical Activity: Unknown (07/23/2023)   Exercise Vital Sign    Days of Exercise per Week: 0 days    Minutes of Exercise per Session: Not on file  Stress: Stress Concern Present (07/23/2023)   [employer] of Occupational Health - Occupational Stress Questionnaire    Feeling of Stress : To some extent  Social Connections: Unknown (07/23/2023)   Social Connection and Isolation Panel [NHANES]    Frequency of Communication with Friends and Family: Patient declined    Frequency of Social Gatherings with Friends and Family: Patient declined    Attends Religious Services: Patient declined    Database Administrator or Organizations: Yes    Attends Banker Meetings: Patient declined    Marital Status: Patient declined  Intimate Partner Violence: Unknown (10/16/2021)   Received from [employer], [facility]   HITS    Physically Hurt: Not on file    Insult or Talk Down To: Not on file    Threaten Physical Harm: Not on file    Scream or Curse: Not on file      Review of Systems  All other systems reviewed and are negative.      Objective:   Physical Exam Vitals reviewed.  Constitutional:      General: He is not in acute distress.    Appearance: Normal appearance. He is obese. He is not ill-appearing or toxic-appearing.  Cardiovascular:     Rate and Rhythm: Normal rate and regular rhythm.     Heart sounds: Normal heart sounds.  Pulmonary:     Effort: Pulmonary effort is normal. No respiratory distress.     Breath sounds: Normal breath sounds. No stridor. No wheezing, rhonchi or rales.  Abdominal:     General: Abdomen is flat. Bowel sounds are normal.     Palpations: Abdomen is soft.  Musculoskeletal:     Right lower leg: No edema.     Left lower leg: No edema.  Skin:    Findings: No erythema.  Neurological:     General: No focal deficit present.     Mental Status: He is alert and oriented to person, place, and time.     Cranial Nerves: No  cranial nerve deficit.     Sensory: No sensory deficit.     Motor: No weakness.     Coordination: Coordination normal.     Gait: Gait abnormal.           Assessment & Plan:  Persistent atrial fibrillation (HCC) - Plan: CBC with Differential/Platelet, COMPLETE METABOLIC PANEL WITH GFR  Pseudogout  Chronic congestive heart failure, unspecified heart failure type (HCC)  Neuropathy   Colchicine  seems to be working well as a preventative for pseudogout we will continue 0.6 mg daily.  Patient continues to have significant pain at night due to neuropathy.  I explained to the patient that he took the gabapentin  every night 30 minutes before bed, it may work better than when the pain wakes him up.  I recommended gradually uptitrating gabapentin  to 300 mg 3 times a day.  Hopefully this will help manage his neuropathy better.  I will also refill his hydrocodone  that he takes intermittently 1-2 times a month for joint pains.  Meanwhile check CBC to monitor for any anemia on anticoagulation and check a CMP to monitor his kidney function along with his potassium due to his heart medication.

## 2023-07-28 LAB — COMPLETE METABOLIC PANEL WITH GFR
AG Ratio: 2.4 (calc) (ref 1.0–2.5)
ALT: 15 U/L (ref 9–46)
AST: 16 U/L (ref 10–35)
Albumin: 4.7 g/dL (ref 3.6–5.1)
Alkaline phosphatase (APISO): 40 U/L (ref 35–144)
BUN/Creatinine Ratio: 22 (calc) (ref 6–22)
BUN: 30 mg/dL — ABNORMAL HIGH (ref 7–25)
CO2: 27 mmol/L (ref 20–32)
Calcium: 10.2 mg/dL (ref 8.6–10.3)
Chloride: 104 mmol/L (ref 98–110)
Creat: 1.35 mg/dL — ABNORMAL HIGH (ref 0.70–1.28)
Globulin: 2 g/dL (ref 1.9–3.7)
Glucose, Bld: 117 mg/dL — ABNORMAL HIGH (ref 65–99)
Potassium: 4.8 mmol/L (ref 3.5–5.3)
Sodium: 140 mmol/L (ref 135–146)
Total Bilirubin: 0.6 mg/dL (ref 0.2–1.2)
Total Protein: 6.7 g/dL (ref 6.1–8.1)
eGFR: 56 mL/min/{1.73_m2} — ABNORMAL LOW (ref >=60)

## 2023-07-28 LAB — CBC WITH DIFFERENTIAL/PLATELET
Absolute Lymphocytes: 2394 {cells}/uL (ref 850–3900)
Absolute Monocytes: 997 {cells}/uL — ABNORMAL HIGH (ref 200–950)
Basophils Absolute: 53 {cells}/uL (ref 0–200)
Basophils Relative: 0.6 %
Eosinophils Absolute: 267 {cells}/uL (ref 15–500)
Eosinophils Relative: 3 %
HCT: 47.1 % (ref 38.5–50.0)
Hemoglobin: 15.4 g/dL (ref 13.2–17.1)
MCH: 28.1 pg (ref 27.0–33.0)
MCHC: 32.7 g/dL (ref 32.0–36.0)
MCV: 85.9 fL (ref 80.0–100.0)
MPV: 12.3 fL (ref 7.5–12.5)
Monocytes Relative: 11.2 %
Neutro Abs: 5189 {cells}/uL (ref 1500–7800)
Neutrophils Relative %: 58.3 %
Platelets: 168 10*3/uL (ref 140–400)
RBC: 5.48 10*6/uL (ref 4.20–5.80)
RDW: 13.4 % (ref 11.0–15.0)
Total Lymphocyte: 26.9 %
WBC: 8.9 10*3/uL (ref 3.8–10.8)

## 2023-07-30 DIAGNOSIS — Z860101 Personal history of adenomatous and serrated colon polyps: Secondary | ICD-10-CM | POA: Diagnosis not present

## 2023-07-30 DIAGNOSIS — Z09 Encounter for follow-up examination after completed treatment for conditions other than malignant neoplasm: Secondary | ICD-10-CM | POA: Diagnosis not present

## 2023-07-30 DIAGNOSIS — K648 Other hemorrhoids: Secondary | ICD-10-CM | POA: Diagnosis not present

## 2023-07-30 DIAGNOSIS — D123 Benign neoplasm of transverse colon: Secondary | ICD-10-CM | POA: Diagnosis not present

## 2023-08-04 DIAGNOSIS — D123 Benign neoplasm of transverse colon: Secondary | ICD-10-CM | POA: Diagnosis not present

## 2023-08-11 ENCOUNTER — Encounter (HOSPITAL_COMMUNITY): Payer: Self-pay | Admitting: Cardiology

## 2023-08-11 ENCOUNTER — Ambulatory Visit (HOSPITAL_COMMUNITY)
Admission: RE | Admit: 2023-08-11 | Discharge: 2023-08-11 | Disposition: A | Discharge status: Home / Self Care | Payer: Medicare HMO | Source: Ambulatory Visit | Attending: Cardiology | Admitting: Cardiology

## 2023-08-11 VITALS — BP 128/70 | HR 68 | Wt 272.0 lb

## 2023-08-11 DIAGNOSIS — Z5986 Financial insecurity: Secondary | ICD-10-CM | POA: Insufficient documentation

## 2023-08-11 DIAGNOSIS — E669 Obesity, unspecified: Secondary | ICD-10-CM | POA: Insufficient documentation

## 2023-08-11 DIAGNOSIS — I7121 Aneurysm of the ascending aorta, without rupture: Secondary | ICD-10-CM | POA: Diagnosis not present

## 2023-08-11 DIAGNOSIS — M792 Neuralgia and neuritis, unspecified: Secondary | ICD-10-CM | POA: Insufficient documentation

## 2023-08-11 DIAGNOSIS — Z79899 Other long term (current) drug therapy: Secondary | ICD-10-CM | POA: Diagnosis not present

## 2023-08-11 DIAGNOSIS — G4733 Obstructive sleep apnea (adult) (pediatric): Secondary | ICD-10-CM | POA: Insufficient documentation

## 2023-08-11 DIAGNOSIS — Z7901 Long term (current) use of anticoagulants: Secondary | ICD-10-CM | POA: Insufficient documentation

## 2023-08-11 DIAGNOSIS — I480 Paroxysmal atrial fibrillation: Secondary | ICD-10-CM | POA: Diagnosis not present

## 2023-08-11 DIAGNOSIS — I110 Hypertensive heart disease with heart failure: Secondary | ICD-10-CM | POA: Diagnosis not present

## 2023-08-11 DIAGNOSIS — I5022 Chronic systolic (congestive) heart failure: Secondary | ICD-10-CM | POA: Insufficient documentation

## 2023-08-11 DIAGNOSIS — I5032 Chronic diastolic (congestive) heart failure: Secondary | ICD-10-CM

## 2023-08-11 DIAGNOSIS — Z6841 Body Mass Index (BMI) 40.0 and over, adult: Secondary | ICD-10-CM | POA: Diagnosis not present

## 2023-08-11 DIAGNOSIS — Z95810 Presence of automatic (implantable) cardiac defibrillator: Secondary | ICD-10-CM | POA: Diagnosis not present

## 2023-08-11 DIAGNOSIS — I4892 Unspecified atrial flutter: Secondary | ICD-10-CM | POA: Insufficient documentation

## 2023-08-11 DIAGNOSIS — M1712 Unilateral primary osteoarthritis, left knee: Secondary | ICD-10-CM | POA: Diagnosis not present

## 2023-08-11 DIAGNOSIS — I428 Other cardiomyopathies: Secondary | ICD-10-CM | POA: Insufficient documentation

## 2023-08-11 DIAGNOSIS — I4819 Other persistent atrial fibrillation: Secondary | ICD-10-CM | POA: Diagnosis not present

## 2023-08-11 DIAGNOSIS — Z9889 Other specified postprocedural states: Secondary | ICD-10-CM | POA: Diagnosis not present

## 2023-08-11 NOTE — Patient Instructions (Signed)
There has been no changes to your medications.  Your physician has requested that you have an echocardiogram. Echocardiography is a painless test that uses sound waves to create images of your heart. It provides your doctor with information about the size and shape of your heart and how well your heart's chambers and valves are working. This procedure takes approximately one hour. There are no restrictions for this procedure. Please do NOT wear cologne, perfume, aftershave, or lotions (deodorant is allowed). Please arrive 15 minutes prior to your appointment time.  Please note: We ask at that you not bring children with you during ultrasound (echo/ vascular) testing. Due to room size and safety concerns, children are not allowed in the ultrasound rooms during exams. Our front office staff cannot provide observation of children in our lobby area while testing is being conducted. An adult accompanying a patient to their appointment will only be allowed in the ultrasound room at the discretion of the ultrasound technician under special circumstances. We apologize for any inconvenience.  [facility]. You will be called to have this appointment arranged.  Your physician recommends that you schedule a follow-up appointment in: 6 months.  If you have any questions or concerns before your next appointment please send [place] a message through [place] or call our office at [phone].    TO LEAVE A MESSAGE FOR THE NURSE SELECT OPTION 2, PLEASE LEAVE A MESSAGE INCLUDING: YOUR NAME DATE OF BIRTH CALL BACK NUMBER REASON FOR CALL**this is important as we prioritize the call backs  YOU WILL RECEIVE A CALL BACK THE SAME DAY AS LONG AS YOU CALL BEFORE 4:00 PM  At the [facility], you and your health needs are our priority. As part of our continuing mission to provide you with exceptional heart care, we have created designated Provider Care Teams. These Care  Teams include your primary Cardiologist (physician) and Advanced Practice Providers (APPs- Physician Assistants and Nurse Practitioners) who all work together to provide you with the care you need, when you need it.   You may see any of the following providers on your designated Care Team at your next follow up: Dr Arvilla Meres Dr Marca Ancona Dr. Dorthula Nettles Dr. Clearnce Hasten Amy Filbert Schilder, NP Robbie Lis, [place] [facility] [place], [place] Brynda Peon, NP [place] Lee, NP Karle Plumber, PharmD   Please be sure to bring in all your medications bottles to every appointment.    Thank you for choosing [facility]

## 2023-08-12 NOTE — Progress Notes (Signed)
PCP: Donita Brooks, MD EP: Dr. Johney Frame Cardiology: Dr. Allyson Sabal HF Cardiology: Dr. Shirlee Latch  Chief Complaint: CHF  73 y.o. with history of chronic atrial fibrillation and flutter with failed Maze procedure, valvular heart disease s/p mitral and aortic valve repairs, and chronic systolic CHF was referred by Dr. Johney Frame for CHF evaluation.  Patient has a long history of atrial fibrillation.  He was initially controlled by Tikosyn but it eventually became chronic.  He had significant mitral and aortic regurgitation, and in 11/19, he had mitral and aortic valve repairs with Maze and LA appendage ligation. Cath prior to valve surgery in 10/19 showed no significant coronary disease.  It appears from ECGs that he never went back into NSR.  He has had slow atrial fibrillation with rate in upper 30s-40s at times, so was never started on amiodarone.  Last echo in 4/20 showed EF 40-45% with stable aortic and mitral valve repairs.  He has developed worsening volume overload, and Lasix has been titrated up to 80 mg bid.  Most recently, he went into atypical atrial flutter with HR consistently in the 110s range.  He therefore was taken for DCCV 03/10/19.  Initially rhythm looked junctional in the 40s-50s, but he subsequently went back into rapid atypical atrial flutter with elevated rate (>100 bpm).   Echo was done in 9/20, showing EF worse at 20-25%.     He saw Dr. Johney Frame and had atrial flutter + atrial fibrillation ablation in 10/20.  Post-procedure, he was noted to be in slow atrial fibrillation (rate 40s) at time of his atrial fibrillation clinic followup.   Patient had St Jude CRT-D implantation in 2/21.  In 6/21, he had AV nodal ablation to allow CRT given recurrent atrial flutter/fibrillation. Echo in 10/21 showed EF up to 60-65%.    He was seen in atrial fibrillation clinic in 5/23 and was noted to be in atrial fibrillation since 4/23.  Thoracic impedance suggested fluid retention. He was in atrial fibrillation  when I saw him in 5/23 and was set up for DCCV in 6/23. He went back into NSR at the time but is in atrial fibrillation again today.   Echo in 7/23 showed EF 50-55%, global hypokinesis, mildly dilated and mildly dysfunctional RV, s/p MV repair with no MR and mean gradient 4, s/p aortic valve repair with mild AS mean gradient 11 and mild AI, normal IVC.   Echo in 7/24 showed EF 55%, mild LVH, mild RV enlargement with mildly decreased RV systolic function, s/p aortic valve repair with mean gradient 12, s/p mitral valve repair with mean gradient 4 mmHg, IVC normal, PASP 27 mmHg.   He returns today for followup of CHF.  Weight is down 6 lbs.  He would like to have left TKR soon, has significant left knee OA with pain. No dyspnea walking on flat ground or up a flight of stairs.  No chest pain.  He has chronic neuropathic pain in his feet.  No orthopnea/PND.   ECG (personally reviewed): Atrial fibrillation with BiV pacing  St Jude device interrogation: >99% BiV pacing, recent fall in thoracic impedance but now back to baseline, he is in atrial fibrillation persistently, no VT.   Labs (8/20): K 3.9, creatinine 1.14, BNP 473, hgb 13.3 Labs (9/20): K 3.3 => 3.7, creatinine 1.35 => 1.3 Labs (12/20): K 4.1, creatinine 1.2 Labs (1/21): K 4.1, creatinine 1.39 Labs (8/21): K 4.3, creatinine 1.42, plts 136 Labs (12/21): K 3.3, creatinine 1.27 Labs (12/22): K 4.5, creatinine  1.27, hgb 14.3, pro-BNP 284 Labs (6/23): K 4.1, creatinine 1.2 Labs (7/23): K 4, creatinine 1.4, LDL 117 Labs (3/24): K 3.8, creatinine 1.2, LDL 105, hgb 14.6 Labs (1/25): K 4.8, creatinine 1.35  PMH: 1. HTN 2. Paroxysmal atrial fibrillation/flutter: Failed Tikosyn.  Had Maze procedure 11/19 but does not appear to have ever gone back into NSR.  Had been in atrial fibrillation with bradycardia with rate in 30s-40s at times, now in atypical atrial flutter primarily with rate 100s-110s.  He failed DCCV 8/20.  No amiodarone due to h/o  bradycardia.  - Atrial fibrillation + atrial flutter ablation in 10/20.  - DCCV to NSR in 6/23 but quickly back into atrial fibrillation.  3. OSA: Uses CPAP.  4. Chronic systolic CHF: Nonischemic cardiomyopathy.  - 10/19 LHC: No coronary disease.  - Echo (4/20): EF 40-45%, mild LVH, moderate dilated RV with mildly decreased systolic function, s/p MV repair with no significant stenosis or regurgitation, s/p aortic valve repair with mild AI, mild aortic stenosis.  Dilated IVC.  - Echo (9/20): EF 20-25%, mild LVH with moderate LV dilation, s/p MV repair with mean gradient 6, s/p AoV repair with mean gradient 8. No significant MR or AI.  - St Jude CRT-D implantation in 2/21.  - AV nodal ablation 6/21.  - Echo (10/21): EF 60-65%, mild LV enlargement and LVH, moderate RV enlargement with normal function, severe LVE, s/p MV repair with no MR and mean gradient 3, s/p aortic valve repair with mean gradient 9 and no AI, ascending aorta 4.6 cm.  - Echo (7/23): EF 50-55%, global hypokinesis, mildly dilated and mildly dysfunctional RV, s/p MV repair with no MR and mean gradient 4, s/p aortic valve repair with mild AS mean gradient 11 and mild AI, normal IVC.  - Echo (7/24): EF 55%, mild LVH, mild RV enlargement with mildly decreased RV systolic function, s/p aortic valve repair with mean gradient 12, s/p mitral valve repair with mean gradient 4 mmHg, IVC normal, PASP 27 mmHg.  5. Valvular heart disease: Patient has history of MR and AI.  In 11/19, he had mitral valve repair, aortic valve repair, surgical Maze, and LA appendage excision.  6. LBBB 7. Ascending aorta aneurysm: 4.6 cm on 10/21 echo.  - CTA chest (7/22): 4.1 cm ascending aorta 8. Bilateral THRs  Social History   Socioeconomic History   Marital status: Married    Spouse name: Not on file   Number of children: Not on file   Years of education: Not on file   Highest education level: Not on file  Occupational History   Occupation: Proofreader  Tobacco Use   Smoking status: Never    Passive exposure: Never   Smokeless tobacco: Never  Vaping Use   Vaping status: Never Used  Substance and Sexual Activity   Alcohol use: No   Drug use: No   Sexual activity: Yes  Other Topics Concern   Not on file  Social History Narrative   Pt lives in [place] with spouse.  Leadership training and behavioral safety training.   Social Drivers of Health   Financial Resource Strain: Medium Risk (07/23/2023)   Overall Financial Resource Strain (CARDIA)    Difficulty of Paying Living Expenses: Somewhat hard  Food Insecurity: Patient Declined (07/23/2023)   Hunger Vital Sign    Worried About Running Out of Food in the Last Year: Patient declined    Ran Out of Food in the Last Year: Patient declined  Transportation Needs: Patient Declined (07/23/2023)   PRAPARE - Administrator, Civil Service (Medical): Patient declined    Lack of Transportation (Non-Medical): Patient declined  Physical Activity: Unknown (07/23/2023)   Exercise Vital Sign    Days of Exercise per Week: 0 days    Minutes of Exercise per Session: Not on file  Stress: Stress Concern Present (07/23/2023)   [employer] of Occupational Health - Occupational Stress Questionnaire    Feeling of Stress : To some extent  Social Connections: Unknown (07/23/2023)   Social Connection and Isolation Panel [NHANES]    Frequency of Communication with Friends and Family: Patient declined    Frequency of Social Gatherings with Friends and Family: Patient declined    Attends Religious Services: Patient declined    Database administrator or Organizations: Yes    Attends Banker Meetings: Patient declined    Marital Status: Patient declined  Intimate Partner Violence: Unknown (10/16/2021)   Received from [employer], [facility]   HITS    Physically Hurt: Not on file    Insult or Talk Down To: Not on file    Threaten Physical Harm: Not on  file    Scream or Curse: Not on file   Family History  Problem Relation Age of Onset   Heart Problems Mother    Arthritis Mother    Cancer Sister    Other Sister        bilateral hip replacements   Other Other        mother had a pacemaker   Healthy Son    Healthy Son    Healthy Daughter    ROS: All systems reviewed and negative except as per HPI.   Current Outpatient Medications  Medication Sig Dispense Refill   acetaminophen (TYLENOL) 325 MG tablet Take 2 tablets (650 mg total) by mouth every 4 (four) hours as needed for headache or mild pain.     carvedilol (COREG) 6.25 MG tablet TAKE 1 TABLET (6.25 MG TOTAL) BY MOUTH 2 (TWO) TIMES DAILY WITH A MEAL. NEEDS FOLLOW UP APPOINTMENT 180 tablet 3   clobetasol ointment (TEMOVATE) 0.05 % Apply 1 application topically daily as needed (rash).     colchicine 0.6 MG tablet Take 1 tablet (0.6 mg total) by mouth daily. 30 tablet 3   dapagliflozin propanediol (FARXIGA) 10 MG TABS tablet Take 1 tablet (10 mg total) by mouth daily before breakfast. 30 tablet 11   diclofenac Sodium (VOLTAREN) 1 % GEL Apply 2 g topically daily as needed (pain).      gabapentin (NEURONTIN) 300 MG capsule 1 tablet Orally three     HYDROcodone-acetaminophen (NORCO/VICODIN) 5-325 MG tablet Take 2 tablets by mouth every 8 (eight) hours as needed. 30 tablet 0   Magnesium 250 MG TABS Take 2 tablets by mouth daily.     NON FORMULARY Pt uses a cpap nightly     Potassium Chloride ER 20 MEQ TBCR      potassium chloride SA (KLOR-CON M) 20 MEQ tablet Take 1 tablet (20 mEq total) by mouth daily. 90 tablet 3   sacubitril-valsartan (ENTRESTO) 24-26 MG TAKE 1 TABLET BY MOUTH TWICE A DAY 60 tablet 11   spironolactone (ALDACTONE) 25 MG tablet TAKE 1 TABLET BY MOUTH EVERY DAY 90 tablet 3   tadalafil (CIALIS) 10 MG tablet Take 10 mg by mouth daily as needed for erectile dysfunction.     torsemide (DEMADEX) 20 MG tablet Take 20 mg by mouth daily.  XARELTO 20 MG TABS tablet TAKE 1  TABLET BY MOUTH DAILY WITH SUPPER 30 tablet 10   No current facility-administered medications for this encounter.   BP 128/70   Pulse 68   Wt 123.4 kg (272 lb)   SpO2 92%   BMI 40.17 kg/m  General: NAD, obese.  Neck: No JVD, no thyromegaly or thyroid nodule.  Lungs: Clear to auscultation bilaterally with normal respiratory effort. CV: Nondisplaced PMI.  Heart regular S1/S2, no S3/S4, no murmur.  No peripheral edema.  No carotid bruit.  Normal pedal pulses.  Abdomen: Soft, nontender, no hepatosplenomegaly, no distention.  Skin: Intact without lesions or rashes.  Neurologic: Alert and oriented x 3.  Psych: Normal affect. Extremities: No clubbing or cyanosis.  HEENT: Normal.   Assessment/Plan: 1. Chronic systolic CHF: Echo in 4/20 with EF 40-45%, moderate RV dilation/mildly decreased RV function.  Nonischemic cardiomyopathy, cath in 10/19 without significant coronary disease.  Repeat echo in 9/20 showed fall in EF to 20-25% in the setting of atrial fibrillation and atrial flutter with persistent tachycardia.  He is now s/p St Jude CRT-D and AV nodal ablation.  Echo in 10/21 with EF up to 60-65%. Echo in 7/23 showed EF 50-55%, global hypokinesis, mildly dilated and mildly dysfunctional RV, s/p MV repair with no MR and mean gradient 4, s/p aortic valve repair with mild AS mean gradient 11 and mild AI, normal IVC.  Echo in 7/24 showed EF 55%, mild LVH, mild RV enlargement with mildly decreased RV systolic function, s/p aortic valve repair with mean gradient 12, s/p mitral valve repair with mean gradient 4 mmHg, IVC normal, PASP 27 mmHg.  NYHA class I-II.  He is not volume overloaded on exam or by Corvue.   - Continue torsemide 20 mg daily.  Recent creatinine stable at 1.35.   - Continue spironolactone 25 mg daily.   - Continue Entresto 24/26 bid.    - Continue Coreg 6.25 mg bid.  - Continue Farxiga 10 mg daily.   - I will arrange for repeat echo to reassess LV systolic function in 7/25 at  followup appt.  2. Atrial arrhythmias: Long history of chronic atrial fibrillation, has had slow ventricular response in the past (HR 40s at times). He failed Tikosyn and have avoided amiodarone with bradycardia.  He had Maze procedure in 11/19 but it was not successful.  At initial appointment with me, he was in atypical flutter with HR 110s.  He failed DCCV in 8/20.  He had flutter/fibrillation ablation in 10/20.  Initially after ablation, he was in slow atrial fibrillation (rate upper 30s-50s generally).  He is now s/p AV nodal ablation with CRT-D.  Atrial fibrillation is now permanent.  Cardioversion was attempted in 6/23 but did not hold.  As he is BiV pacing > 99% of the time in atrial fibrillation s/p AVN ablation, I think we can hold off on trying cardioversion again.  - He will continue Xarelto.   - He asks again about Watchman device.  He has had no GI bleeding and has no trouble getting Xarelto, so do not think he needs to go through an invasive procedure for this.  3. OSA: Continue CPAP.  4. Valvular heart disease: S/p MV and AoV repairs in 11/19.  Valves looked stable on 7/24 echo.  - He will need antibiotic prophylaxis with dental work.  5. Ascending aortic aneurysm: 4.6 cm on 10/21 echo. CTA chest in 7/22 showed 4.1 cm ascending aorta.  6. Left knee OA: He  needs left TKR.  I think he is of reasonable risk to undergo procedure. He can hold Xarelto 3 days prior and restart afterwards.  7. Obesity: I will refer to pharmacy clinic to see if we can get coverage for semaglutide/tirzepatide.    Followup in 7/25 with APP, will get echo as well.   I spent 32 minutes reviewing chart, interacting with patient, and managing orders.   Marca Ancona 08/12/2023

## 2023-08-17 ENCOUNTER — Ambulatory Visit: Payer: Medicare HMO | Attending: Cardiology

## 2023-08-17 DIAGNOSIS — Z95810 Presence of automatic (implantable) cardiac defibrillator: Secondary | ICD-10-CM

## 2023-08-17 DIAGNOSIS — I5032 Chronic diastolic (congestive) heart failure: Secondary | ICD-10-CM

## 2023-08-20 ENCOUNTER — Encounter: Payer: Self-pay | Admitting: Pharmacist

## 2023-08-20 ENCOUNTER — Ambulatory Visit: Payer: Medicare HMO | Attending: Cardiovascular Disease | Admitting: Pharmacist

## 2023-08-20 ENCOUNTER — Telehealth: Payer: Self-pay | Admitting: Pharmacist

## 2023-08-20 ENCOUNTER — Ambulatory Visit: Payer: Medicare HMO

## 2023-08-20 VITALS — Ht 70.0 in | Wt 272.0 lb

## 2023-08-20 DIAGNOSIS — I5032 Chronic diastolic (congestive) heart failure: Secondary | ICD-10-CM

## 2023-08-20 DIAGNOSIS — E669 Obesity, unspecified: Secondary | ICD-10-CM

## 2023-08-20 DIAGNOSIS — G4733 Obstructive sleep apnea (adult) (pediatric): Secondary | ICD-10-CM

## 2023-08-20 NOTE — Progress Notes (Signed)
 Patient ID: [name]                 DOB: [birth year]                    MRN: [id number]     HPI: [name] is a 73 y.o. male patient referred to pharmacy clinic by Dr Rolan to initiate GLP1-RA therapy. PMH is significant for CHF, CAD, A Fib, HTN, OSA, MVR, AVR, and obesity. Most recent BMI 39.03.  Patient presents today to discuss GLP1a therapy. Has struggled with weight and sleep apnea. Reports he and his wife no longer eat out because it is difficult to monitor sodium. Therefore they eat the majority of their meals at home.  Reports he eats when he is nervous. Wife knows this and tries to keep foods away.  Can not sleep without his CPAP.  Patient also awaiting clearance for knee replacement however surgeon would like him to lose 10#. Knee injury has made physical activity difficult for him.   Labs: Lab Results  Component Value Date   HGBA1C 5.6 02/04/2023    Wt Readings from Last 1 Encounters:  08/11/23 272 lb (123.4 kg)    BP Readings from Last 1 Encounters:  08/11/23 128/70   Pulse Readings from Last 1 Encounters:  08/11/23 68       Component Value Date/Time   CHOL 170 09/12/2022 1049   CHOL 126 11/16/2018 0000   TRIG 117 09/12/2022 1049   HDL 42 09/12/2022 1049   HDL 37 (L) 11/16/2018 0000   CHOLHDL 4.0 09/12/2022 1049   VLDL 23 09/12/2022 1049   LDLCALC 105 (H) 09/12/2022 1049   LDLCALC 74 11/16/2018 0000    Past Medical History:  Diagnosis Date   AICD (automatic cardioverter/defibrillator) present    Aortic insufficiency 04/21/2018   Aortic insufficiency    Arthritis    joints (09/26/2013)   Atrial enlargement, left    CHF (congestive heart failure) (HCC)    Essential hypertension 11/28/2020   Hearing aid worn    both ears   Mitral regurgitation 04/21/2018   Obesity    OSA on CPAP    mask adjusts to what I need (09/26/2013)   Persistent atrial fibrillation (HCC)    cardioversion 06/06/13   PONV (postoperative nausea and vomiting)     Rotator cuff tear, right dec 2012   physical therapy done, decreased strength   S/P aortic valve repair 05/18/2018   Complex valvuloplasty including plication of right coronary leaflet and 21 mm Biostable HAART annuloplasty ring   S/P Maze operation for atrial fibrillation 05/18/2018   Complete bilateral atrial lesion set using bipolar radiofrequency and cryothermy ablation with clipping of LA appendage   S/P mitral valve repair 05/18/2018   Complex valvuloplasty including artificial Gore-tex neochord placement x4 and 30 mm Sorin Memo 4D ring annuloplasty   Wears glasses     Current Outpatient Medications on File Prior to Visit  Medication Sig Dispense Refill   acetaminophen  (TYLENOL ) 325 MG tablet Take 2 tablets (650 mg total) by mouth every 4 (four) hours as needed for headache or mild pain.     carvedilol  (COREG ) 6.25 MG tablet TAKE 1 TABLET (6.25 MG TOTAL) BY MOUTH 2 (TWO) TIMES DAILY WITH A MEAL. NEEDS FOLLOW UP APPOINTMENT 180 tablet 3   clobetasol ointment (TEMOVATE) 0.05 % Apply 1 application topically daily as needed (rash).     colchicine  0.6 MG tablet Take 1 tablet (0.6 mg total) by  mouth daily. 30 tablet 3   dapagliflozin  propanediol (FARXIGA ) 10 MG TABS tablet Take 1 tablet (10 mg total) by mouth daily before breakfast. 30 tablet 11   diclofenac Sodium (VOLTAREN) 1 % GEL Apply 2 g topically daily as needed (pain).      gabapentin  (NEURONTIN ) 300 MG capsule 1 tablet Orally three     HYDROcodone -acetaminophen  (NORCO/VICODIN) 5-325 MG tablet Take 2 tablets by mouth every 8 (eight) hours as needed. 30 tablet 0   Magnesium  250 MG TABS Take 2 tablets by mouth daily.     NON FORMULARY Pt uses a cpap nightly     Potassium Chloride  ER 20 MEQ TBCR      potassium chloride  SA (KLOR-CON  M) 20 MEQ tablet Take 1 tablet (20 mEq total) by mouth daily. 90 tablet 3   sacubitril -valsartan  (ENTRESTO ) 24-26 MG TAKE 1 TABLET BY MOUTH TWICE A DAY 60 tablet 11   spironolactone  (ALDACTONE ) 25 MG tablet  TAKE 1 TABLET BY MOUTH EVERY DAY 90 tablet 3   tadalafil  (CIALIS ) 10 MG tablet Take 10 mg by mouth daily as needed for erectile dysfunction.     torsemide  (DEMADEX ) 20 MG tablet Take 20 mg by mouth daily.     XARELTO  20 MG TABS tablet TAKE 1 TABLET BY MOUTH DAILY WITH SUPPER 30 tablet 10   No current facility-administered medications on file prior to visit.    Allergies  Allergen Reactions   Metoprolol  Tartrate Shortness Of Breath    Shortness of breath, pressure in chest and back   Oxycodone  Rash and Other (See Comments)    Can tolerate Hydrocodone      Assessment/Plan:  1. OSA/Obesity - [place] current BMI 39.03 placing him in Class II obesity category. Due to OSA, PAD, and CHF, recommend addition of GLP1a.  Using demo pens, educated patient on mechanism of action, storage, site selection, and possible adverse effects. Confirmed no personal or family history of medullary thyroid  carcinoma (MTC) or Multiple Endocrine Neoplasia syndrome type 2 (MEN 2). Injection technique reviewed at today's visit.  Advised patient on common side effects including nausea, diarrhea, dyspepsia, decreased appetite, and fatigue. Counseled patient on reducing meal size and how to titrate medication to minimize side effects. Counseled patient to call if intolerable side effects or if experiencing dehydration, abdominal pain, or dizziness. Patient will adhere to dietary modifications.  Start Zepbound  2.5mg  once wekly  Chris Odessa Nishi, PharmD, [place], CDCES, CPP [address] [place], [place], [address] Phone: [phone], Fax: [phone]

## 2023-08-20 NOTE — Patient Instructions (Addendum)
 It was nice meeting you this morning  The medications we talked about today are called Zepbound  and Wegovy   Both are injections you would take once weekly  I will complete the prior authorizations and contact you with the results  Please let us  know if you have any questions  Medford Bolk, PharmD, BCACP, CDCES, CPP [address] [place], [place], [address] Phone: [phone], Fax: [phone]

## 2023-08-20 NOTE — Telephone Encounter (Signed)
 Please complete PA for Zepbound . Recommend using OSA and PAD diagnoses

## 2023-08-21 ENCOUNTER — Telehealth: Payer: Self-pay | Admitting: Pharmacy Technician

## 2023-08-21 ENCOUNTER — Other Ambulatory Visit (HOSPITAL_COMMUNITY): Payer: Self-pay

## 2023-08-21 DIAGNOSIS — G4733 Obstructive sleep apnea (adult) (pediatric): Secondary | ICD-10-CM

## 2023-08-21 DIAGNOSIS — I5022 Chronic systolic (congestive) heart failure: Secondary | ICD-10-CM

## 2023-08-21 DIAGNOSIS — E669 Obesity, unspecified: Secondary | ICD-10-CM

## 2023-08-21 NOTE — Telephone Encounter (Signed)
 Pharmacy Patient Advocate Encounter  Received notification from [employer] that Prior Authorization for zepbound  has been APPROVED from 08/21/23 to 07/13/24. Ran test claim, Copay is $374.04 one month. This test claim was processed through [facility]- copay amounts may vary at other pharmacies due to pharmacy/plan contracts, or as the patient moves through the different stages of their insurance plan.   PA #/Case ID/Reference #: [id number]

## 2023-08-21 NOTE — Telephone Encounter (Signed)
 Pharmacy Patient Advocate Encounter   Received notification from Pt Calls Messages that prior authorization for zepbound  is required/requested.   Insurance verification completed.   The patient is insured through [employer] .   Per test claim: PA required; PA submitted to above mentioned insurance via CoverMyMeds Key/confirmation #/EOC [id number] Status is pending

## 2023-08-26 ENCOUNTER — Telehealth: Payer: Self-pay | Admitting: Cardiovascular Disease

## 2023-08-26 ENCOUNTER — Telehealth (HOSPITAL_COMMUNITY): Payer: Self-pay | Admitting: Pharmacist

## 2023-08-26 ENCOUNTER — Other Ambulatory Visit: Payer: Self-pay

## 2023-08-26 DIAGNOSIS — E669 Obesity, unspecified: Secondary | ICD-10-CM

## 2023-08-26 DIAGNOSIS — G4733 Obstructive sleep apnea (adult) (pediatric): Secondary | ICD-10-CM

## 2023-08-26 DIAGNOSIS — I5022 Chronic systolic (congestive) heart failure: Secondary | ICD-10-CM

## 2023-08-26 MED ORDER — ZEPBOUND 2.5 MG/0.5ML ~~LOC~~ SOAJ: 2.5000 mg | SUBCUTANEOUS | 0 refills | Status: DC

## 2023-08-26 NOTE — Telephone Encounter (Signed)
Patient identification verified by 2 forms. Marilynn Rail, RN    Called and spoke to patient  Patient states:   -first time he is starting Zepbound   -[facility]  -per his insurance he can only receive Rx at [facility]   -pharmacy did recommend possibly changing to [facility]  Informed patient:   -Message sent to Dr. Shirlee Latch for advisement   -if requesting [phone], new Rx and possible PA will be needed  Patient verbalized understanding, no questions at this time

## 2023-08-26 NOTE — Telephone Encounter (Signed)
Pt c/o medication issue:  1. Name of Medication: tirzepatide (ZEPBOUND) 2.5 MG/0.5ML Pen   2. How are you currently taking this medication (dosage and times per day)?   3. Are you having a reaction (difficulty breathing--STAT)?   4. What is your medication issue? [facility]

## 2023-08-26 NOTE — Telephone Encounter (Signed)
[facility] [place]. Please advise

## 2023-08-26 NOTE — Addendum Note (Signed)
Addended by: Cheree Ditto on: 08/26/2023 10:04 AM   Modules accepted: Orders

## 2023-08-26 NOTE — Telephone Encounter (Signed)
Received VM from patient that he had had a recent visit with the [facility] Pharmacist to start Zepbound on 08/20/23. He stated he got a message from his insurance last Friday that the PA had been approved but he called his pharmacy and a prescription still has not been sent so he can pick it up. He is going to be going out of town soon and would like to pick it up before that happens.   Will forward to Laural Golden, PharmD as he has been seeing the patient for Zepbound initiation so prescription can be sent.   Karle Plumber, PharmD, BCPS, BCCP, CPP [facility] Pharmacist [phone]

## 2023-08-27 ENCOUNTER — Telehealth: Payer: Self-pay | Admitting: Pharmacist

## 2023-08-27 ENCOUNTER — Other Ambulatory Visit (HOSPITAL_COMMUNITY): Payer: Self-pay

## 2023-08-27 ENCOUNTER — Other Ambulatory Visit: Payer: Self-pay

## 2023-08-27 DIAGNOSIS — E669 Obesity, unspecified: Secondary | ICD-10-CM

## 2023-08-27 DIAGNOSIS — G4733 Obstructive sleep apnea (adult) (pediatric): Secondary | ICD-10-CM

## 2023-08-27 DIAGNOSIS — I5022 Chronic systolic (congestive) heart failure: Secondary | ICD-10-CM

## 2023-08-27 MED ORDER — WEGOVY 0.25 MG/0.5ML ~~LOC~~ SOAJ: 0.2500 mg | SUBCUTANEOUS | 0 refills | Status: DC

## 2023-08-27 MED ORDER — ZEPBOUND 2.5 MG/0.5ML ~~LOC~~ SOAJ
2.5000 mg | SUBCUTANEOUS | 0 refills | Status: DC
  Filled 2023-08-27 (×2): qty 2, 28d supply, fill #0

## 2023-08-27 NOTE — Telephone Encounter (Signed)
[facility] ot [facility]. Routing to [employer] for delivery services

## 2023-08-28 ENCOUNTER — Telehealth: Payer: Self-pay | Admitting: Pharmacy Technician

## 2023-08-28 ENCOUNTER — Other Ambulatory Visit (HOSPITAL_COMMUNITY): Payer: Self-pay

## 2023-08-28 NOTE — Telephone Encounter (Signed)
Patient identification verified by 2 forms. Lance Rail, RN    Called and spoke to patient  Patient states:   -[facility]   -does not need wegovy anymore  Patient has no further questions at this time

## 2023-08-28 NOTE — Telephone Encounter (Signed)
Pharmacy Patient Advocate Encounter   Received notification from Pt Calls Messages that prior authorization for wegovy is required/requested.   Insurance verification completed.   The patient is insured through [employer] .   Per test claim: PA required; PA submitted to above mentioned insurance via CoverMyMeds Key/confirmation #/EOC [id number] Status is pending

## 2023-08-28 NOTE — Telephone Encounter (Addendum)
Pharmacy Patient Advocate Encounter  Received notification from [employer] that Prior Authorization for Reginal Lutes has been APPROVED from 01/01//25 to 07/13/24. Ran test claim, Copay is $217.70- one month. This test claim was processed through [facility]- copay amounts may vary at other pharmacies due to pharmacy/plan contracts, or as the patient moves through the different stages of their insurance plan.   PA #/Case ID/Reference #: [id number]

## 2023-09-14 ENCOUNTER — Other Ambulatory Visit (HOSPITAL_COMMUNITY): Payer: Self-pay

## 2023-09-14 ENCOUNTER — Telehealth: Payer: Self-pay | Admitting: Pharmacist

## 2023-09-14 ENCOUNTER — Other Ambulatory Visit: Payer: Self-pay

## 2023-09-14 ENCOUNTER — Encounter: Payer: Self-pay | Admitting: Family Medicine

## 2023-09-14 ENCOUNTER — Encounter: Payer: Self-pay | Admitting: Cardiology

## 2023-09-14 ENCOUNTER — Ambulatory Visit: Payer: Medicare HMO | Attending: Cardiology | Admitting: Cardiology

## 2023-09-14 VITALS — BP 116/70 | HR 60 | Ht 70.0 in | Wt 271.4 lb

## 2023-09-14 DIAGNOSIS — Z95810 Presence of automatic (implantable) cardiac defibrillator: Secondary | ICD-10-CM | POA: Diagnosis not present

## 2023-09-14 DIAGNOSIS — I4821 Permanent atrial fibrillation: Secondary | ICD-10-CM

## 2023-09-14 DIAGNOSIS — I5022 Chronic systolic (congestive) heart failure: Secondary | ICD-10-CM | POA: Diagnosis not present

## 2023-09-14 DIAGNOSIS — E669 Obesity, unspecified: Secondary | ICD-10-CM

## 2023-09-14 DIAGNOSIS — G4733 Obstructive sleep apnea (adult) (pediatric): Secondary | ICD-10-CM

## 2023-09-14 DIAGNOSIS — H5213 Myopia, bilateral: Secondary | ICD-10-CM | POA: Diagnosis not present

## 2023-09-14 LAB — CUP PACEART INCLINIC DEVICE CHECK
Date Time Interrogation Session: 20250303151311
Implantable Lead Connection Status: 753985
Implantable Lead Connection Status: 753985
Implantable Lead Connection Status: 753985
Implantable Lead Implant Date: 20210209
Implantable Lead Implant Date: 20210209
Implantable Lead Implant Date: 20210209
Implantable Lead Location: 753858
Implantable Lead Location: 753859
Implantable Lead Location: 753860
Implantable Pulse Generator Implant Date: 20210209
Pulse Gen Serial Number: 111006442

## 2023-09-14 MED ORDER — ZEPBOUND 5 MG/0.5ML ~~LOC~~ SOAJ
5.0000 mg | SUBCUTANEOUS | 0 refills | Status: DC
  Filled 2023-09-14 (×2): qty 2, 28d supply, fill #0

## 2023-09-14 NOTE — Telephone Encounter (Signed)
 Patient called. Reports doing well on Zepbound 2.5mg  and has begun to lose weight. Will send in 5mg  strength to [facility]. Patient requests delivery

## 2023-09-14 NOTE — Progress Notes (Signed)
 Electrophysiology Office Follow up Visit Note:    Date:  09/14/2023   ID:  [name], DOB Aug 13, 1950, MRN [id number]  PCP:  Donita Brooks, MD  [facility] Cardiologist:  Nanetta Batty, MD  [facility] Electrophysiologist:  Lanier Prude, MD    Interval History:     [name] is a 73 y.o. male who presents for a follow up visit.   I last saw the patient in August 11, 2022.  He was originally followed by Dr. Johney Frame.  He has chronic atrial fibrillation, prior mitral and aortic valve surgeries.  He was previously on Tikosyn.  He has a ligated left atrial appendage.  He has a CRT-D that was implanted in February 2021.  AV node ablation followed this.  At the last appointment we plan for 1 year follow-up.  He saw Dr. Shirlee Latch in the heart failure clinic August 11, 2023.  At that appointment he reported a plan to pursue left sided knee replacement.  An echo was planned.  He is doing well today.  He has an upcoming knee replacement surgery.  He has an echo planned before that surgical date.  No problems with his device.        Past medical, surgical, social and family history were reviewed.  ROS:   Please see the history of present illness.    All other systems reviewed and are negative.  EKGs/Labs/Other Studies Reviewed:    The following studies were reviewed today:  September 14, 2023 in clinic device interrogation personally reviewed Battery and lead parameters okay.  He does have an underlying today 40 bpm.  I reprogrammed his device to VVIR with a VOO noise reversion given his permanent atrial fibrillation.  He BiV paces greater than 99%.  We reviewed his CT chest from 2022 which showed a successfully post left atrial appendage.       Physical Exam:    VS:  BP 116/70   Pulse 60   Ht 5\' 10"  (1.778 m)   Wt 271 lb 6.4 oz (123.1 kg)   SpO2 96%   BMI 38.94 kg/m     Wt Readings from Last 3 Encounters:  09/14/23 271 lb 6.4 oz (123.1 kg)  08/20/23 272  lb (123.4 kg)  08/11/23 272 lb (123.4 kg)     GEN: no distress.  Obese CARD: RRR, No MRG.  CIED pocket well-healed RESP: No IWOB. CTAB.      ASSESSMENT:    1. Obesity (BMI 30-39.9)   2. Chronic systolic heart failure (HCC)   3. Permanent atrial fibrillation (HCC)   4. Cardiac resynchronization therapy defibrillator (CRT-D) in place    PLAN:    In order of problems listed above:  #Permanent atrial fibrillation #Surgical left atrial appendage ligation/closure #CRT-D in situ Device functioning appropriately.  Continue remote monitoring.  Continue Xarelto.  Okay to hold Xarelto 2 to 3 days prior to planned knee replacement.  Recommend restarting postop once felt safe from a surgical perspective.  #Chronic systolic heart failure NYHA class II.  Follows with the heart failure clinic.  Continue torsemide, spironolactone, Entresto, Farxiga, Coreg.  #Pre Op Risk Stratification [name]'s perioperative risk of a major cardiac event is 0.9% according to the Revised Cardiac Risk Index (RCRI).  Therefore, he is at low risk for perioperative complications.   The patient requires an echocardiogram before a disposition can be made regarding surgical risk.  Xarelto (Rivaroxaban) can be held for 3 days prior to surgery.  Please resume post op when felt to be safe.     Follow-up 1 year with EP APP.   Signed, Steffanie Dunn, MD, [facility], [facility] 09/14/2023 3:09 PM    Electrophysiology [facility]

## 2023-09-14 NOTE — Patient Instructions (Signed)
 Medication Instructions:  Your physician recommends that you continue on your current medications as directed. Please refer to the Current Medication list given to you today.  *If you need a refill on your cardiac medications before your next appointment, please call your pharmacy*  Follow-Up: At [facility], you and your health needs are our priority.  As part of our continuing mission to provide you with exceptional heart care, we have created designated Provider Care Teams.  These Care Teams include your primary Cardiologist (physician) and Advanced Practice Providers (APPs -  Physician Assistants and Nurse Practitioners) who all work together to provide you with the care you need, when you need it.  Your next appointment:   1 year  Provider:   You will see one of the following Advanced Practice Providers on your designated Care Team:   [name], [name] [address] [place], [place] Sherie Don, NP Canary Brim, NP

## 2023-09-15 ENCOUNTER — Other Ambulatory Visit (HOSPITAL_COMMUNITY): Payer: Self-pay

## 2023-09-15 ENCOUNTER — Ambulatory Visit (INDEPENDENT_AMBULATORY_CARE_PROVIDER_SITE_OTHER): Payer: Medicare HMO

## 2023-09-15 ENCOUNTER — Other Ambulatory Visit: Payer: Self-pay

## 2023-09-15 ENCOUNTER — Encounter: Payer: Self-pay | Admitting: Cardiology

## 2023-09-15 DIAGNOSIS — I5022 Chronic systolic (congestive) heart failure: Secondary | ICD-10-CM

## 2023-09-15 DIAGNOSIS — I428 Other cardiomyopathies: Secondary | ICD-10-CM | POA: Diagnosis not present

## 2023-09-17 LAB — CUP PACEART REMOTE DEVICE CHECK
Battery Remaining Longevity: 41 mo
Battery Remaining Percentage: 43 %
Battery Voltage: 2.93 V
Date Time Interrogation Session: 20250304021302
HighPow Impedance: 84 Ohm
Implantable Lead Connection Status: 753985
Implantable Lead Connection Status: 753985
Implantable Lead Connection Status: 753985
Implantable Lead Implant Date: 20210209
Implantable Lead Implant Date: 20210209
Implantable Lead Implant Date: 20210209
Implantable Lead Location: 753858
Implantable Lead Location: 753859
Implantable Lead Location: 753860
Implantable Pulse Generator Implant Date: 20210209
Lead Channel Impedance Value: 450 Ohm
Lead Channel Impedance Value: 510 Ohm
Lead Channel Impedance Value: 880 Ohm
Lead Channel Pacing Threshold Amplitude: 1 V
Lead Channel Pacing Threshold Amplitude: 1.5 V
Lead Channel Pacing Threshold Pulse Width: 0.5 ms
Lead Channel Pacing Threshold Pulse Width: 0.5 ms
Lead Channel Sensing Intrinsic Amplitude: 0.4 mV
Lead Channel Sensing Intrinsic Amplitude: 12 mV
Lead Channel Setting Pacing Amplitude: 2.5 V
Lead Channel Setting Pacing Amplitude: 2.5 V
Lead Channel Setting Pacing Pulse Width: 0.5 ms
Lead Channel Setting Pacing Pulse Width: 0.5 ms
Lead Channel Setting Sensing Sensitivity: 0.5 mV
Pulse Gen Serial Number: 111006442
Zone Setting Status: 755011

## 2023-09-21 ENCOUNTER — Ambulatory Visit: Payer: Medicare HMO | Attending: Cardiology

## 2023-09-21 DIAGNOSIS — Z95810 Presence of automatic (implantable) cardiac defibrillator: Secondary | ICD-10-CM | POA: Diagnosis not present

## 2023-09-21 DIAGNOSIS — I5022 Chronic systolic (congestive) heart failure: Secondary | ICD-10-CM

## 2023-09-23 DIAGNOSIS — M1712 Unilateral primary osteoarthritis, left knee: Secondary | ICD-10-CM | POA: Diagnosis not present

## 2023-09-23 DIAGNOSIS — M5416 Radiculopathy, lumbar region: Secondary | ICD-10-CM | POA: Insufficient documentation

## 2023-09-23 NOTE — Progress Notes (Signed)
 EPIC Encounter for ICM Monitoring  Patient Name: [name] is a 73 y.o. male Date: 09/23/2023 Primary Care Physican: Donita Brooks, MD Primary Cardiologist: Shirlee Latch Electrophysiologist: Townsend Roger Pacing: >99%        12/02/2021 Office Weight: 267 lbs 12/10/2021 Office Weight: 276 lbs 12/17/2021 Weight:  Has not weighed since last OV 09/12/2022 Office Weight: 285 lbs 02/04/2023 Weight: 278 lbs 05/29/2023 Weight: 269 lbs 08/20/2023 Weight: 269 lbs                                                          AT/AF Burden: >99% (taking Xarelto)    Spoke with patient and heart failure questions reviewed.  Transmission results reviewed.  Pt asymptomatic for fluid accumulation.  Reports feeling well at this time and voices no complaints.    Total Knee Replacement surgery scheduled for 4/1.   CorVue thoracic impedance suggesting normal fluid levels with the exception of possible fluid accumulation from 2/8-2/14.  Possible dryness from 2/20-3/4.   Prescribed: Torsemide 20 mg take 1 tablet (20 mg total) daily.  He self adjusts Torsemide if needed.  Klor Con 20 mEq take take 1 tablet daily  Spironolactone 25 mg take 1 tablet daily   Labs: 07/27/2023 Creatinine 1.35, BUN 30, Potassium 4.8, Sodium 140, GFR 56 05/19/2023 Creatinine 1.42, BUN 29, Potassium 4.8, Sodium 140, GFR 53 04/13/2023 Creatinine 1.27, BUN 27, Potassium 5.0, Sodium 137 A complete set of results can be found in Results Review.   Recommendations:  Discussed the need to take diuretics following knee surgery since he will receive IV fluids during surgery. No changes and encouraged to call if experiencing any fluid symptoms.   Follow-up plan: ICM clinic phone appointment on 10/26/2023.  91 day device clinic remote transmission 12/15/2023.     EP/Cardiology Office Visits:   02/08/2024 with HF clinic.  Recall 09/09/2023 with Dr Lalla Brothers or EP APP.   Copy of ICM check sent to Dr. Lalla Brothers.    3 month ICM trend: 09/21/2023.    12-14  Month ICM trend:     Karie Soda, RN 09/23/2023 8:41 AM

## 2023-09-24 ENCOUNTER — Other Ambulatory Visit (HOSPITAL_COMMUNITY): Payer: Self-pay | Admitting: Cardiology

## 2023-09-24 ENCOUNTER — Encounter: Payer: Self-pay | Admitting: Cardiology

## 2023-09-24 DIAGNOSIS — M5416 Radiculopathy, lumbar region: Secondary | ICD-10-CM | POA: Diagnosis not present

## 2023-09-25 NOTE — Progress Notes (Signed)
 Surgery orders requested via Epic inbox.

## 2023-09-28 DIAGNOSIS — M1712 Unilateral primary osteoarthritis, left knee: Secondary | ICD-10-CM | POA: Diagnosis not present

## 2023-10-01 ENCOUNTER — Encounter: Payer: Self-pay | Admitting: Cardiology

## 2023-10-01 NOTE — Progress Notes (Signed)
 PERIOPERATIVE PRESCRIPTION FOR IMPLANTED CARDIAC DEVICE PROGRAMMING  Patient Information: Name:  [name]  DOB:  07-Aug-1950  MRN:  [id number]    Planned Procedure:  Left Total knee arthroplasty  Surgeon: Dr. Renaye Rakers  Date of Procedure: 10-13-2023  Cautery will be used.  Position during surgery: Supine   Please send documentation back to:  [facility] (Fax# [phone]) or Respond to the IB message.  Let me know if the Company Rep. Needs to be contacted.  Device Information:  Clinic EP Physician:  Dr. Steffanie Dunn   Device Type:  Defibrillator Manufacturer and Phone #:  St. Jude/Abbott: [phone] Pacemaker Dependent?:  No. Date of Last Device Check:  09/21/2023 Normal Device Function?:  Yes.    Electrophysiologist's Recommendations:  Have magnet available. Provide continuous ECG monitoring when magnet is used or reprogramming is to be performed.  Procedure should not interfere with device function.  No device programming or magnet placement needed.  Per Device Clinic Standing Orders, Lenor Coffin, RN  9:24 PM 10/01/2023

## 2023-10-01 NOTE — Patient Instructions (Signed)
 SURGICAL WAITING ROOM VISITATION Patients having surgery or a procedure may have no more than 2 support people in the waiting area - these visitors may rotate in the visitor waiting room.   Due to an increase in RSV and influenza rates and associated hospitalizations, children ages 47 and under may not visit patients in [facility] hospitals. If the patient needs to stay at the hospital during part of their recovery, the visitor guidelines for inpatient rooms apply.  PRE-OP VISITATION  Pre-op nurse will coordinate an appropriate time for 1 support person to accompany the patient in pre-op.  This support person may not rotate.  This visitor will be contacted when the time is appropriate for the visitor to come back in the pre-op area.  Please refer to the [facility] website for the visitor guidelines for Inpatients (after your surgery is over and you are in a regular room).  You are not required to quarantine at this time prior to your surgery. However, you must do this: Hand Hygiene often Do NOT share personal items Notify your provider if you are in close contact with someone who has COVID or you develop fever 100.4 or greater, new onset of sneezing, cough, sore throat, shortness of breath or body aches.  If you test positive for Covid or have been in contact with anyone that has tested positive in the last 10 days please notify you surgeon.    Your procedure is scheduled on:  Tuesday  October 13, 2023   Report to [facility] Main Entrance: [address] [facility]sen entrance where the [employer] is available.   Report to admitting at:  06:15   AM  Call this number if you have any questions or problems the morning of surgery [phone]  Do not eat food after Midnight the night prior to your surgery/procedure.  After Midnight you may have the following liquids until    05:45  AM  DAY OF SURGERY  Clear Liquid Diet Water Black Coffee (sugar ok, NO MILK/CREAM OR CREAMERS)  Tea (sugar ok, NO  MILK/CREAM OR CREAMERS) regular and decaf                             Plain Jell-O  with no fruit (NO RED)                                           Fruit ices (not with fruit pulp, NO RED)                                     Popsicles (NO RED)                                                                  Juice: NO CITRUS JUICES: only apple, WHITE grape, WHITE cranberry Sports drinks like Gatorade or Powerade (NO RED)                   The day of surgery:  Drink ONE (1) Pre-Surgery Clear Ensure at 05:45  AM  the morning of surgery. Drink in one sitting. Do not sip.  This drink was given to you during your hospital pre-op appointment visit. Nothing else to drink after completing the Pre-Surgery Clear Ensure : No candy, chewing gum or throat lozenges.    FOLLOW ANY ADDITIONAL PRE OP INSTRUCTIONS YOU RECEIVED FROM YOUR SURGEON'S OFFICE!!!   Oral Hygiene is also important to reduce your risk of infection.        Remember - BRUSH YOUR TEETH THE MORNING OF SURGERY WITH YOUR REGULAR TOOTHPASTE  Do NOT smoke after Midnight the night before surgery.  XARELTO-  Stop taking 72 hours before surgery.  Last dose will be taken on Friday 10-09-23  Tirzepatide (ZepBound)-  Stop taking 7-10 days before surgery. Last dose will be taken on 10-01-2023 Farxiga-  Stop taking 72 hours before surgery. Last dose will be taken on Friday 10-09-2023.  STOP TAKING all Vitamins, Herbs and supplements 1 week before your surgery.   Take ONLY these medicines the morning of surgery with A SIP OF WATER: Colchicine, carvedilol, gabapentin. You may take EITHER Tylenol OR Hydrocodone if needed for pain.   If You have been diagnosed with Sleep Apnea - Bring CPAP mask and tubing day of surgery. We will provide you with a CPAP machine on the day of your surgery.                   You may not have any metal on your body including  jewelry, and body piercing  Do not wear  lotions, powders, cologne, or deodorant  Men may  shave face and neck.  Contacts, Hearing Aids, dentures or bridgework may not be worn into surgery. DENTURES WILL BE REMOVED PRIOR TO SURGERY PLEASE DO NOT APPLY "Poly grip" OR ADHESIVES!!!  You may bring a small overnight bag with you on the day of surgery, only pack items that are not valuable. [facility] IS NOT RESPONSIBLE   FOR VALUABLES THAT ARE LOST OR STOLEN.   Do not bring your home medications to the hospital. The Pharmacy will dispense medications listed on your medication list to you during your admission in the Hospital.  Special Instructions: Bring a copy of your healthcare power of attorney and living will documents the day of surgery, if you wish to have them scanned into your [facility] Records- EPIC  Please read over the following fact sheets you were given: IF YOU HAVE QUESTIONS ABOUT YOUR PRE-OP INSTRUCTIONS, PLEASE CALL [phone].     Pre-operative 5 CHG Bath Instructions   You can play a key role in reducing the risk of infection after surgery. Your skin needs to be as free of germs as possible. You can reduce the number of germs on your skin by washing with CHG (chlorhexidine gluconate) soap before surgery. CHG is an antiseptic soap that kills germs and continues to kill germs even after washing.   DO NOT use if you have an allergy to chlorhexidine/CHG or antibacterial soaps. If your skin becomes reddened or irritated, stop using the CHG and notify one of our RNs at [phone]  Please shower with the CHG soap starting 4 days before surgery using the following schedule: START SHOWERS ON   FRIDAY  October 09, 2023  Please keep in mind the following:  DO NOT shave, including legs and underarms, starting the day of your first shower.   You may shave your face at any point before/day of  surgery.   Place clean sheets on your bed the day you start using CHG soap. Use a clean washcloth (not used since being washed) for each shower. DO NOT sleep with pets once you start using the CHG.   CHG Shower Instructions:  If you choose to wash your hair and private area, wash first with your normal shampoo/soap.  After you use shampoo/soap, rinse your hair and body thoroughly to remove shampoo/soap residue.  Turn the water OFF and apply about 3 tablespoons (45 ml) of CHG soap to a CLEAN washcloth.  Apply CHG soap ONLY FROM YOUR NECK DOWN TO YOUR TOES (washing for 3-5 minutes)  DO NOT use CHG soap on face, private areas, open wounds, or sores.  Pay special attention to the area where your surgery is being performed.  If you are having back surgery, having someone wash your back for you may be helpful.  Wait 2 minutes after CHG soap is applied, then you may rinse off the CHG soap.  Pat dry with a clean towel  Put on clean clothes/pajamas   If you choose to wear lotion, please use ONLY the CHG-compatible lotions on the back of this paper.     Additional instructions for the day of surgery: DO NOT APPLY any lotions, deodorants, cologne, or perfumes.   Put on clean/comfortable clothes.  Brush your teeth.  Ask your nurse before applying any prescription medications to the skin.      CHG Compatible Lotions   Aveeno Moisturizing lotion  Cetaphil Moisturizing Cream  Cetaphil Moisturizing Lotion  Clairol Herbal Essence Moisturizing Lotion, Dry Skin  Clairol Herbal Essence Moisturizing Lotion, Extra Dry Skin  Clairol Herbal Essence Moisturizing Lotion, Normal Skin  Curel Age Defying Therapeutic Moisturizing Lotion with Alpha Hydroxy  Curel Extreme Care Body Lotion  Curel Soothing Hands Moisturizing Hand Lotion  Curel Therapeutic Moisturizing Cream, Fragrance-Free  Curel Therapeutic Moisturizing Lotion, Fragrance-Free  Curel Therapeutic Moisturizing Lotion, Original Formula   Eucerin Daily Replenishing Lotion  Eucerin Dry Skin Therapy Plus Alpha Hydroxy Crme  Eucerin Dry Skin Therapy Plus Alpha Hydroxy Lotion  Eucerin Original Crme  Eucerin Original Lotion  Eucerin Plus Crme Eucerin Plus Lotion  Eucerin TriLipid Replenishing Lotion  Keri Anti-Bacterial Hand Lotion  Keri Deep Conditioning Original Lotion Dry Skin Formula Softly Scented  Keri Deep Conditioning Original Lotion, Fragrance Free Sensitive Skin Formula  Keri Lotion Fast Absorbing Fragrance Free Sensitive Skin Formula  Keri Lotion Fast Absorbing Softly Scented Dry Skin Formula  Keri Original Lotion  Keri Skin Renewal Lotion Keri Silky Smooth Lotion  Keri Silky Smooth Sensitive Skin Lotion  Nivea Body Creamy Conditioning Oil  Nivea Body Extra Enriched Lotion  Nivea Body Original Lotion  Nivea Body Sheer Moisturizing Lotion Nivea Crme  Nivea Skin Firming Lotion  NutraDerm 30 Skin Lotion  NutraDerm Skin Lotion  NutraDerm Therapeutic Skin Cream  NutraDerm Therapeutic Skin Lotion  ProShield Protective Hand Cream  Provon moisturizing lotion   FAILURE TO FOLLOW THESE INSTRUCTIONS MAY RESULT IN THE CANCELLATION OF YOUR SURGERY  PATIENT SIGNATURE_________________________________  NURSE SIGNATURE__________________________________  ________________________________________________________________________        [name]    An incentive spirometer is a tool that can help keep your lungs clear and active. This tool measures how well you are filling your lungs with each breath.  Taking long deep breaths may help reverse or decrease the chance of developing breathing (pulmonary) problems (especially infection) following: A long period of time when you are unable to move or be active. BEFORE THE PROCEDURE  If the spirometer includes an indicator to show your best effort, your nurse or respiratory therapist will set it to a desired goal. If possible, sit up straight or lean slightly  forward. Try not to slouch. Hold the incentive spirometer in an upright position. INSTRUCTIONS FOR USE  Sit on the edge of your bed if possible, or sit up as far as you can in bed or on a chair. Hold the incentive spirometer in an upright position. Breathe out normally. Place the mouthpiece in your mouth and seal your lips tightly around it. Breathe in slowly and as deeply as possible, raising the piston or the ball toward the top of the column. Hold your breath for 3-5 seconds or for as long as possible. Allow the piston or ball to fall to the bottom of the column. Remove the mouthpiece from your mouth and breathe out normally. Rest for a few seconds and repeat Steps 1 through 7 at least 10 times every 1-2 hours when you are awake. Take your time and take a few normal breaths between deep breaths. The spirometer may include an indicator to show your best effort. Use the indicator as a goal to work toward during each repetition. After each set of 10 deep breaths, practice coughing to be sure your lungs are clear. If you have an incision (the cut made at the time of surgery), support your incision when coughing by placing a pillow or rolled up towels firmly against it. Once you are able to get out of bed, walk around indoors and cough well. You may stop using the incentive spirometer when instructed by your caregiver.  RISKS AND COMPLICATIONS Take your time so you do not get dizzy or light-headed. If you are in pain, you may need to take or ask for pain medication before doing incentive spirometry. It is harder to take a deep breath if you are having pain. AFTER USE Rest and breathe slowly and easily. It can be helpful to keep track of a log of your progress. Your caregiver can provide you with a simple table to help with this. If you are using the spirometer at home, follow these instructions: SEEK MEDICAL CARE IF:  You are having difficultly using the spirometer. You have trouble using the  spirometer as often as instructed. Your pain medication is not giving enough relief while using the spirometer. You develop fever of 100.5 F (38.1 C) or higher.                                                                                                    SEEK IMMEDIATE MEDICAL CARE IF:  You cough up bloody sputum that had not been present before. You develop fever of 102 F (38.9 C) or greater. You develop worsening pain at or near the incision site. MAKE SURE YOU:  Understand these instructions. Will watch your  condition. Will get help right away if you are not doing well or get worse. Document Released: 11/10/2006 Document Revised: 09/22/2011 Document Reviewed: 01/11/2007 [employer] Patient Information 2014 [place], [place].     If you would like to see a video about joint replacement:   [email or web address]

## 2023-10-01 NOTE — Progress Notes (Signed)
 COVID Vaccine received:  []  No []  Yes Date of any COVID positive Test in last 90 days:  PCP - Lynnea Ferrier, MD medical clearance scanned to [facility] Cardiologist - Nanetta Batty, MD  EP- Steffanie Dunn, MD Clearance in 09-14-23 Epic note. PM&R- Sheran Luz, MD   Chest x-ray - 08-23-2019  2v Epic EKG -  08-11-2023  Epic Stress Test -  ECHO - 02-04-2023  Epic   ?Needs ECHO prior to surgery per Dr. Shirlee Latch, per Bonney Leitz, clearance had been faxed to [place] to be scanned to [facility]. 10-02-23 Cardiac Cath -   PCR screen: [x]  Ordered & Completed []   No Order but Needs PROFEND     []   N/A for this surgery  Surgery Plan:  []  Ambulatory   []  Outpatient in bed  [x]  Admit Anesthesia:    []  General  [x]  Spinal  []   Choice []   MAC  Pacemaker / ICD device []  No [x]  Yes  Abbott ICD GALLANT HFCRTD [id number] - [id number]   Device orders in Epic note 10-01-2023, Last checked 09-15-23  Spinal Cord Stimulator:[x]  No []  Yes       History of Sleep Apnea? []  No [x]  Yes   CPAP used?- []  No [x]  Yes    Does the patient monitor blood sugar?   [x]  N/A   []  No []  Yes  Patient has: [x]  NO Hx DM   []  Pre-DM   []  DM1  []   DM2 Last A1c was: 5.6 normal on 02-04-23      Patient takes ZepBound and [place] for his kidneys and Heart failure only.  Blood Thinner / Instructions: XARELTO  hold 3 days per Dr. Shirlee Latch Aspirin Instructions:None  ERAS Protocol Ordered: []  No  [x]  Yes PRE-SURGERY [x]  ENSURE  []  G2    Patient is to be NPO after: 05:45  Dental hx: []  Dentures:  [x]  N/A      []  Bridge or Partial:                   []  Loose or Damaged teeth:   Comments: Patient was given the 5 CHG shower / bath instructions for TKA surgery along with 2 bottles of the CHG soap. Patient will start this on:  10-09-2023  All questions were asked and answered, Patient voiced understanding of this process.   Activity level: Patient is able to climb a flight of stairs without difficulty; [x]  No CP but would have SOB and leg pain.  Patient can   perform ADLs without assistance.   Anesthesia review: CHF, LBBB (AICD- last checked 09-15-23), s/p AVR/ MAZE 05-18-2018,  A.Fib.- multiple ablations and DCCVs, OSA- CPAP, CKD3, PONV,  Takes Zepbound and [place] for heart and kidneys.   Patient denies shortness of breath, fever, cough and chest pain at PAT appointment.  Patient verbalized understanding and agreement to the Pre-Surgical Instructions that were given to them at this PAT appointment. Patient was also educated of the need to review these PAT instructions again prior to his surgery.I reviewed the appropriate phone numbers to call if they have any and questions or concerns.

## 2023-10-02 ENCOUNTER — Other Ambulatory Visit: Payer: Self-pay

## 2023-10-02 ENCOUNTER — Encounter (HOSPITAL_COMMUNITY): Payer: Self-pay

## 2023-10-02 ENCOUNTER — Encounter (HOSPITAL_COMMUNITY)
Admission: RE | Admit: 2023-10-02 | Discharge: 2023-10-02 | Disposition: A | Discharge status: Home / Self Care | Source: Ambulatory Visit | Attending: Orthopedic Surgery | Admitting: Orthopedic Surgery

## 2023-10-02 VITALS — BP 121/73 | HR 64 | Temp 98.4°F | Resp 16 | Ht 70.0 in | Wt 259.0 lb

## 2023-10-02 DIAGNOSIS — I4891 Unspecified atrial fibrillation: Secondary | ICD-10-CM | POA: Diagnosis not present

## 2023-10-02 DIAGNOSIS — Z01812 Encounter for preprocedural laboratory examination: Secondary | ICD-10-CM | POA: Insufficient documentation

## 2023-10-02 DIAGNOSIS — Z01818 Encounter for other preprocedural examination: Secondary | ICD-10-CM | POA: Diagnosis present

## 2023-10-02 DIAGNOSIS — I2510 Atherosclerotic heart disease of native coronary artery without angina pectoris: Secondary | ICD-10-CM | POA: Diagnosis not present

## 2023-10-02 DIAGNOSIS — Z7901 Long term (current) use of anticoagulants: Secondary | ICD-10-CM | POA: Diagnosis not present

## 2023-10-02 HISTORY — DX: Myoneural disorder, unspecified: G70.9

## 2023-10-02 HISTORY — DX: Cardiac arrhythmia, unspecified: I49.9

## 2023-10-02 LAB — BASIC METABOLIC PANEL
Anion gap: 10 (ref 5–15)
BUN: 23 mg/dL (ref 8–23)
CO2: 27 mmol/L (ref 22–32)
Calcium: 9.8 mg/dL (ref 8.9–10.3)
Chloride: 99 mmol/L (ref 98–111)
Creatinine, Ser: 1.18 mg/dL (ref 0.61–1.24)
GFR, Estimated: >60 mL/min (ref >60)
Glucose, Bld: 88 mg/dL (ref 70–99)
Potassium: 3.7 mmol/L (ref 3.5–5.1)
Sodium: 136 mmol/L (ref 135–145)

## 2023-10-02 LAB — CBC
HCT: 48.9 % (ref 39.0–52.0)
Hemoglobin: 15.7 g/dL (ref 13.0–17.0)
MCH: 28.3 pg (ref 26.0–34.0)
MCHC: 32.1 g/dL (ref 30.0–36.0)
MCV: 88.1 fL (ref 80.0–100.0)
Platelets: 177 10*3/uL (ref 150–400)
RBC: 5.55 MIL/uL (ref 4.22–5.81)
RDW: 13.5 % (ref 11.5–15.5)
WBC: 10.8 10*3/uL — ABNORMAL HIGH (ref 4.0–10.5)
nRBC: 0 % (ref 0.0–0.2)

## 2023-10-02 LAB — SURGICAL PCR SCREEN
MRSA, PCR: NEGATIVE
Staphylococcus aureus: NEGATIVE

## 2023-10-07 NOTE — Progress Notes (Addendum)
 Case: [id number] Date/Time: 10/13/23 0830   Procedure: ARTHROPLASTY, KNEE, TOTAL (Left: Knee)   Anesthesia type: Spinal   Pre-op diagnosis: OA LEFT KNEE   Location: [facility] ROOM 08 / [facility]   Surgeons: Sheral Apley, MD       DISCUSSION: [name] is a 73 yo male who presents to PAT prior to surgery above. PMH HTN, persistent A.fib s/p Maze (2019), MR s/p MVR (2019), AI s/p AVR (2019), NICM s/p ICD (2021), OSA (uses CPAP), arthritis.  Patient follows with Cardiology for extensive cardiac hx as outlined above. Diagnosed with A.fib in 2014. This became persistent and as part of his w/u prior to undergoing Maze procedure he had a cath in 04/2018 which showed no CAD but he had NICM with EF 35-40% and severe mitral and aortic valve disease. He underwent repair of these with Maze and LA appendage ligation on 05/18/2018 with CT surgery. Unfortunately A.fib has still been persistent. His EF worsened and he underwent ICD placement in 2021. He follows in advanced heart failure clinic and was last seen by Dr. Shirlee Latch on 08/11/23. His last echo in 01/2023 showed recovered EF of 55%. Noted to be stable/euvolemic at that visit and felt to be "reasonable risk" to undergo procedure. Seen by Dr. Lalla Brothers with EP on 09/14/23 and noted to be doing well. Updated echo was recommended prior to proceeding.  Echo performed on 10/12/23 and shows normal LVEF with stable repaired valve function.  Device orders: Electrophysiologist's Recommendations:   Have magnet available. Provide continuous ECG monitoring when magnet is used or reprogramming is to be performed.  Procedure should not interfere with device funct  XARELTO  hold 3 days per Dr. Shirlee Latch  VS: BP 121/73 Comment: right arm sitting  Pulse 64   Temp 36.9 C (Oral)   Resp 16   Ht 5\' 10"  (1.778 m)   Wt 117.5 kg   SpO2 97%   BMI 37.16 kg/m   PROVIDERS: Donita Brooks, MD General Cardiology: Dr. Allyson Sabal CHF: Shirlee Latch EP: Lalla Brothers  LABS: Labs reviewed:  Acceptable for surgery. (all labs ordered are listed, but only abnormal results are displayed)  Labs Reviewed  CBC - Abnormal; Notable for the following components:      Result Value   WBC 10.8 (*)    All other components within normal limits  SURGICAL PCR SCREEN  BASIC METABOLIC PANEL     IMAGES:   EKG 08/11/23:  Ventricular-paced rhythm, rate 60 Biventricular pacemaker detected  CV:  Device check 09/15/23:  Scheduled remote reviewed. Normal device function since last in-clinic 09/14/2023.   Permanent AF, Xarelto per EMR, good V rate control  Next remote 91 days.   Echo 10/12/23  IMPRESSIONS     1. Left ventricular ejection fraction, by estimation, is 55 to 60%. The  left ventricle has normal function. The left ventricle has no regional  wall motion abnormalities. Left ventricular diastolic parameters are  indeterminate.   2. Right ventricular systolic function is normal. The right ventricular  size is mildly enlarged. There is normal pulmonary artery systolic  pressure.   3. Left atrial size was moderately dilated.   4. Right atrial size was moderately dilated.   5. The mitral valve has been repaired/replaced. Trivial mitral valve  regurgitation. No evidence of mitral stenosis. The mean mitral valve  gradient is 3.0 mmHg. There is a 30 mm Sorrin Memo 4D Ring Prosthetic  Annuloplasty Ring present in the mitral  position. Procedure Date: 05/2018.   6. The  aortic valve has been repaired/replaced. Aortic valve  regurgitation is not visualized. There is a 21 mm Biostable HAART  annuloplasty ring valve present in the aortic position. Procedure Date:  05/2018. Aortic valve area, by VTI measures 2.56 cm.  Aortic valve mean gradient measures 10.0 mmHg.   7. Aortic dilatation noted. There is mild dilatation of the ascending  aorta, measuring 42 mm.   8. The inferior vena cava is dilated in size with >50% respiratory  variability, suggesting right atrial pressure of 8  mmHg.   Comparison(s): No significant change from prior study.    Past Medical History:  Diagnosis Date   AICD (automatic cardioverter/defibrillator) present    Aortic insufficiency 04/21/2018   Aortic insufficiency    Arthritis    "joints" (09/26/2013)   Atrial enlargement, left    CHF (congestive heart failure) (HCC)    Dysrhythmia    A. fib   Essential hypertension 11/28/2020   Hearing aid worn    both ears   Mitral regurgitation 04/21/2018   Neuromuscular disorder (HCC)    Neuropathy in both feet   Obesity    OSA on CPAP    "mask adjusts to what I need" (09/26/2013)   Persistent atrial fibrillation (HCC)    cardioversion 06/06/13   PONV (postoperative nausea and vomiting)    Rotator cuff tear, right 06/2011   physical therapy done, decreased strength   S/P aortic valve repair 05/18/2018   Complex valvuloplasty including plication of right coronary leaflet and 21 mm Biostable HAART annuloplasty ring   S/P Maze operation for atrial fibrillation 05/18/2018   Complete bilateral atrial lesion set using bipolar radiofrequency and cryothermy ablation with clipping of LA appendage   S/P mitral valve repair 05/18/2018   Complex valvuloplasty including artificial Gore-tex neochord placement x4 and 30 mm Sorin Memo 4D ring annuloplasty   Wears glasses     Past Surgical History:  Procedure Laterality Date   AORTIC VALVE REPAIR N/A 05/18/2018   Procedure: AORTIC VALVE REPAIR using HAART 300 Aortic Annuloplasty Device size 21mm;  Surgeon: Purcell Nails, MD;  Location: [facility];  Service: Open Heart Surgery;  Laterality: N/A;   ATRIAL FIBRILLATION ABLATION N/A 04/26/2019   Procedure: ATRIAL FIBRILLATION ABLATION;  Surgeon: Hillis Range, MD;  Location: [facility];  Service: Cardiovascular;  Laterality: N/A;   AV NODE ABLATION N/A 12/20/2019   Procedure: AV NODE ABLATION;  Surgeon: Hillis Range, MD;  [facility]: [facility];  Service: Cardiovascular;  Laterality: N/A;    BIV ICD INSERTION CRT-D N/A 08/23/2019   Procedure: BIV ICD INSERTION CRT-D;  Surgeon: Hillis Range, MD;  [facility];  Service: Cardiovascular;  Laterality: N/A;   CARDIOVERSION N/A 06/06/2013   Procedure: CARDIOVERSION;  Surgeon: Thurmon Fair, MD;  Location: [facility];  Service: Cardiovascular;  Laterality: N/A;   CARDIOVERSION N/A 09/28/2013   Procedure: CARDIOVERSION BEDSIDE;  Surgeon: Peter M Swaziland, MD;  Location: [facility] OR;  Service: Cardiovascular;  Laterality: N/A;   CARDIOVERSION N/A 11/30/2018   Procedure: CARDIOVERSION;  Surgeon: Lars Masson, MD;  Location: [facility] ENDOSCOPY;  Service: Cardiovascular;  Laterality: N/A;   CARDIOVERSION N/A 03/10/2019   Procedure: CARDIOVERSION;  Surgeon: Little Ishikawa, MD;  Location: [facility] ENDOSCOPY;  Service: Endoscopy;  Laterality: N/A;   CARDIOVERSION N/A 12/20/2021   Procedure: CARDIOVERSION;  Surgeon: Laurey Morale, MD;  Location: [facility] ENDOSCOPY;  Service: Cardiovascular;  Laterality: N/A;   CARPAL TUNNEL RELEASE Right 08/08/2013   Procedure: RIGHT CARPAL TUNNEL  RELEASE;  Surgeon: Nicki Reaper, MD;  Location: [facility];  Service: Orthopedics;  Laterality: Right;   CARPAL TUNNEL RELEASE Left 07/14/2006   CLIPPING OF ATRIAL APPENDAGE  05/18/2018   Procedure: CLIPPING OF ATRIAL APPENDAGE using AtriCure Clip size 45;  Surgeon: Purcell Nails, MD;  Location: [facility] OR;  Service: Open Heart Surgery;;   KNEE ARTHROSCOPY Right 03/14/1989   MAZE N/A 05/18/2018   Procedure: MAZE;  Surgeon: Purcell Nails, MD;  Location: [facility] OR;  Service: Open Heart Surgery;  Laterality: N/A;   MITRAL VALVE REPAIR N/A 05/18/2018   Procedure: MITRAL VALVE REPAIR (MVR) using 4D Memo Ring size 30;  Surgeon: Purcell Nails, MD;  Location: [facility] OR;  Service: Open Heart Surgery;  Laterality: N/A;   PACEMAKER INSERTION     RIGHT/LEFT HEART CATH AND CORONARY ANGIOGRAPHY N/A 04/21/2018   Procedure: RIGHT/LEFT HEART CATH AND CORONARY  ANGIOGRAPHY;  Surgeon: Yvonne Kendall, MD;  [facility];  Service: Cardiovascular;  Laterality: N/A;   SHOULDER OPEN ROTATOR CUFF REPAIR Left 03/14/1989   SHOULDER OPEN ROTATOR CUFF REPAIR Right 11/12/2007   TEE WITHOUT CARDIOVERSION N/A 04/21/2018   Procedure: TRANSESOPHAGEAL ECHOCARDIOGRAM (TEE);  Surgeon: Jake Bathe, MD;  Location: [facility] ENDOSCOPY;  Service: Cardiovascular;  Laterality: N/A;   TEE WITHOUT CARDIOVERSION N/A 05/18/2018   Procedure: TRANSESOPHAGEAL ECHOCARDIOGRAM (TEE);  Surgeon: Purcell Nails, MD;  Location: [facility] OR;  Service: Open Heart Surgery;  Laterality: N/A;   TONSILLECTOMY  1950's   TOTAL HIP ARTHROPLASTY Left 07/14/2008   TOTAL HIP ARTHROPLASTY Right 05/29/2020   Procedure: TOTAL HIP ARTHROPLASTY ANTERIOR APPROACH;  Surgeon: Sheral Apley, MD;  Location[facility];  Service: Orthopedics;  Laterality: Right;   TOTAL HIP REVISION Left 10/08/2011   TOTAL HIP REVISION  04/09/2012   Procedure: TOTAL HIP REVISION;  Surgeon: Loanne Drilling, MD;  Location: [facility];  Service: Orthopedics;  Laterality: Left;  Left Hip Acetabular Revision vs Constrained Liner   TOTAL KNEE ARTHROPLASTY Right 07/14/2004   TRIGGER FINGER RELEASE Right 08/08/2013   Procedure: RELEASE STENOSING TENOSYNOVITIS RIGHT THUMB;  Surgeon: Nicki Reaper, MD;  Location: [facility];  Service: Orthopedics;  Laterality: Right;    MEDICATIONS:  acetaminophen (TYLENOL) 500 MG tablet   carvedilol (COREG) 6.25 MG tablet   colchicine 0.6 MG tablet   dapagliflozin propanediol (FARXIGA) 10 MG TABS tablet   diclofenac Sodium (VOLTAREN) 1 % GEL   gabapentin (NEURONTIN) 300 MG capsule   HYDROcodone-acetaminophen (NORCO/VICODIN) 5-325 MG tablet   KLOR-CON M20 20 MEQ tablet   MAGNESIUM PO   NON FORMULARY   sacubitril-valsartan (ENTRESTO) 24-26 MG   spironolactone (ALDACTONE) 25 MG tablet   tadalafil (CIALIS) 10 MG tablet   tirzepatide (ZEPBOUND) 5 MG/0.5ML Pen   torsemide  (DEMADEX) 20 MG tablet   XARELTO 20 MG TABS tablet   No current facility-administered medications for this encounter.   Marcille Blanco [facility] Short Stay/Anesthesiology [facility] Phone [phone] 10/12/2023 3:27 PM

## 2023-10-07 NOTE — Care Plan (Signed)
 Ortho Bundle Case Management Note  Patient Details  Name: [name] MRN: [id number] Date of Birth: 1951/01/23  met with patient and wife in the office for H&P. will discharge to home with family to assist. has RW. CPM ordered. OPPT set up with [facility] [address] [facility]. discharge instructions discussed and questions answered. Patient and MD in agreement with plan. Choice offered                     DME Arranged:  CPM DME Agency:  [employer]  HH Arranged:    HH Agency:     Additional Comments: Please contact me with any questions of if this plan should need to change.  Shauna Hugh,  RN,BSN,MHA,CCM  [facility] Orthopaedic Specialist  [phone] 10/07/2023, 11:31 AM

## 2023-10-08 NOTE — H&P (Signed)
 KNEE ARTHROPLASTY ADMISSION H&P  Patient ID: [name] MRN: [id number] DOB/AGE: 11-03-1950 73 y.o.  Chief Complaint: left knee pain.  Planned Procedure Date: 10/13/23 Medical Clearance by Dr. Lynnea Ferrier   Cardiac Clearance by Dr. Marca Ancona   HPI: [name] is a 73 y.o. male who presents for evaluation of OA LEFT KNEE. The patient has a history of pain and functional disability in the left knee due to arthritis and has failed non-surgical conservative treatments for greater than 12 weeks to include NSAID's and/or analgesics, supervised PT with diminished ADL's post treatment, weight reduction as appropriate, and activity modification.  Onset of symptoms was gradual, starting 3 years ago with gradually worsening course since that time. The patient noted no past surgery on the left knee.  Patient currently rates pain at 9 out of 10 with activity. Patient has night pain, worsening of pain with activity and weight bearing, and pain that interferes with activities of daily living.  Patient has evidence of subchondral sclerosis, periarticular osteophytes, and joint space narrowing by imaging studies.  There is no active infection.  Past Medical History:  Diagnosis Date   AICD (automatic cardioverter/defibrillator) present    Aortic insufficiency 04/21/2018   Aortic insufficiency    Arthritis    "joints" (09/26/2013)   Atrial enlargement, left    CHF (congestive heart failure) (HCC)    Dysrhythmia    A. fib   Essential hypertension 11/28/2020   Hearing aid worn    both ears   Mitral regurgitation 04/21/2018   Neuromuscular disorder (HCC)    Neuropathy in both feet   Obesity    OSA on CPAP    "mask adjusts to what I need" (09/26/2013)   Persistent atrial fibrillation (HCC)    cardioversion 06/06/13   PONV (postoperative nausea and vomiting)    Rotator cuff tear, right 06/2011   physical therapy done, decreased strength   S/P aortic valve repair 05/18/2018   Complex  valvuloplasty including plication of right coronary leaflet and 21 mm Biostable HAART annuloplasty ring   S/P Maze operation for atrial fibrillation 05/18/2018   Complete bilateral atrial lesion set using bipolar radiofrequency and cryothermy ablation with clipping of LA appendage   S/P mitral valve repair 05/18/2018   Complex valvuloplasty including artificial Gore-tex neochord placement x4 and 30 mm Sorin Memo 4D ring annuloplasty   Wears glasses    Past Surgical History:  Procedure Laterality Date   AORTIC VALVE REPAIR N/A 05/18/2018   Procedure: AORTIC VALVE REPAIR using HAART 300 Aortic Annuloplasty Device size 21mm;  Surgeon: Purcell Nails, MD;  Location: [facility] OR;  Service: Open Heart Surgery;  Laterality: N/A;   ATRIAL FIBRILLATION ABLATION N/A 04/26/2019   Procedure: ATRIAL FIBRILLATION ABLATION;  Surgeon: Hillis Range, MD;  Location: [facility] INVASIVE CV LAB;  Service: Cardiovascular;  Laterality: N/A;   AV NODE ABLATION N/A 12/20/2019   Procedure: AV NODE ABLATION;  Surgeon: Hillis Range, MD;  Location: [facility];  Service: Cardiovascular;  Laterality: N/A;   BIV ICD INSERTION CRT-D N/A 08/23/2019   Procedure: BIV ICD INSERTION CRT-D;  Surgeon: Hillis Range, MD;  Location: [facility]AS[facility] CV LAB;  Service: Cardiovascular;  Laterality: N/A;   CARDIOVERSION N/A 06/06/2013   Procedure: CARDIOVERSION;  Surgeon: Thurmon Fair, MD;  Location: [facility] ENDOSCOPY;  Service: Cardiovascular;  Laterality: N/A;   CARDIOVERSION N/A 09/28/2013   Procedure: CARDIOVERSION BEDSIDE;  Surgeon: Peter M Swaziland, MD;  Location: [facility] OR;  Service: Cardiovascular;  Laterality: N/A;  CARDIOVERSION N/A 11/30/2018   Procedure: CARDIOVERSION;  Surgeon: Lars Masson, MD;  Location: [facility] ENDOSCOPY;  Service: Cardiovascular;  Laterality: N/A;   CARDIOVERSION N/A 03/10/2019   Procedure: CARDIOVERSION;  Surgeon: Little Ishikawa, MD;  Location: [facility] ENDOSCOPY;  Service: Endoscopy;  Laterality: N/A;    CARDIOVERSION N/A 12/20/2021   Procedure: CARDIOVERSION;  Surgeon: Laurey Morale, MD;  Location: [facility] ENDOSCOPY;  Service: Cardiovascular;  Laterality: N/A;   CARPAL TUNNEL RELEASE Right 08/08/2013   Procedure: RIGHT CARPAL TUNNEL RELEASE;  Surgeon: Nicki Reaper, MD;  Location: [facility];  Service: Orthopedics;  Laterality: Right;   CARPAL TUNNEL RELEASE Left 07/14/2006   CLIPPING OF ATRIAL APPENDAGE  05/18/2018   Procedure: CLIPPING OF ATRIAL APPENDAGE using AtriCure Clip size 45;  Surgeon: Purcell Nails, MD;  Location: [facility] OR;  Service: Open Heart Surgery;;   KNEE ARTHROSCOPY Right 03/14/1989   MAZE N/A 05/18/2018   Procedure: MAZE;  Surgeon: Purcell Nails, MD;  Location: [facility] OR;  Service: Open Heart Surgery;  Laterality: N/A;   MITRAL VALVE REPAIR N/A 05/18/2018   Procedure: MITRAL VALVE REPAIR (MVR) using 4D Memo Ring size 30;  Surgeon: Purcell Nails, MD;  Location: [facility] OR;  Service: Open Heart Surgery;  Laterality: N/A;   PACEMAKER INSERTION     RIGHT/LEFT HEART CATH AND CORONARY ANGIOGRAPHY N/A 04/21/2018   Procedure: RIGHT/LEFT HEART CATH AND CORONARY ANGIOGRAPHY;  Surgeon: Yvonne Kendall, MD;  [facility]: [facility] INVAS[facility] CV LAB;  Service: Cardiovascular;  Laterality: N/A;   SHOULDER OPEN ROTATOR CUFF REPAIR Left 03/14/1989   SHOULDER OPEN ROTATOR CUFF REPAIR Right 11/12/2007   TEE WITHOUT CARDIOVERSION N/A 04/21/2018   Procedure: TRANSESOPHAGEAL ECHOCARDIOGRAM (TEE);  Surgeon: Jake Bathe, MD;  Location: [facility] ENDOSCOPY;  Service: Cardiovascular;  Laterality: N/A;   TEE WITHOUT CARDIOVERSION N/A 05/18/2018   Procedure: TRANSESOPHAGEAL ECHOCARDIOGRAM (TEE);  Surgeon: Purcell Nails, MD;  Location: [facility] OR;  Service: Open Heart Surgery;  Laterality: N/A;   TONSILLECTOMY  1950's   TOTAL HIP ARTHROPLASTY Left 07/14/2008   TOTAL HIP ARTHROPLASTY Right 05/29/2020   Procedure: TOTAL HIP ARTHROPLASTY ANTERIOR APPROACH;  Surgeon: Sheral Apley, MD;  Location: [facility] ORS;   Service: Orthopedics;  Laterality: Right;   TOTAL HIP REVISION Left 10/08/2011   TOTAL HIP REVISION  04/09/2012   Procedure: TOTAL HIP REVISION;  Surgeon: Loanne Drilling, MD;  Location: [facility] ORS;  Service: Orthopedics;  Laterality: Left;  Left Hip Acetabular Revision vs Constrained Liner   TOTAL KNEE ARTHROPLASTY Right 07/14/2004   TRIGGER FINGER RELEASE Right 08/08/2013   Procedure: RELEASE STENOSING TENOSYNOVITIS RIGHT THUMB;  Surgeon: Nicki Reaper, MD;  Location: [facility];  Service: Orthopedics;  Laterality: Right;   Allergies  Allergen Reactions   Metoprolol Tartrate Shortness Of Breath   Oxycodone Rash and Other (See Comments)    Can tolerate Hydrocodone   Prior to Admission medications   Medication Sig Start Date End Date Taking? Authorizing Provider  acetaminophen (TYLENOL) 500 MG tablet Take 1,000 mg by mouth every 6 (six) hours as needed for moderate pain (pain score 4-6).   Yes [provider]  carvedilol (COREG) 6.25 MG tablet TAKE 1 TABLET (6.25 MG TOTAL) BY MOUTH 2 (TWO) TIMES DAILY WITH A MEAL. NEEDS FOLLOW UP APPOINTMENT 11/03/22  Yes Laurey Morale, MD  colchicine 0.6 MG tablet Take 1 tablet (0.6 mg total) by mouth daily. 04/28/23  Yes Donita Brooks, MD  dapagliflozin propanediol (FARXIGA) 10 MG TABS  tablet Take 1 tablet (10 mg total) by mouth daily before breakfast. 02/04/23  Yes Laurey Morale, MD  diclofenac Sodium (VOLTAREN) 1 % GEL Apply 2 g topically daily as needed (pain).  12/14/19  Yes [provider]  gabapentin (NEURONTIN) 300 MG capsule Take 300 mg by mouth daily.   Yes [provider]  HYDROcodone-acetaminophen (NORCO/VICODIN) 5-325 MG tablet Take 2 tablets by mouth every 8 (eight) hours as needed. Patient taking differently: Take 1 tablet by mouth every 8 (eight) hours as needed. 07/27/23  Yes Donita Brooks, MD  KLOR-CON M20 20 MEQ tablet TAKE 1 TABLET BY MOUTH EVERY DAY 09/24/23  Yes Laurey Morale, MD   MAGNESIUM PO Take 1 tablet by mouth daily.   Yes [provider]  sacubitril-valsartan (ENTRESTO) 24-26 MG TAKE 1 TABLET BY MOUTH TWICE A DAY 11/03/22  Yes Laurey Morale, MD  spironolactone (ALDACTONE) 25 MG tablet TAKE 1 TABLET BY MOUTH EVERY DAY 09/24/23  Yes Laurey Morale, MD  tadalafil (CIALIS) 10 MG tablet Take 10 mg by mouth daily as needed for erectile dysfunction. 10/16/20  Yes [provider]  tirzepatide (ZEPBOUND) 5 MG/0.5ML Pen Inject 5 mg into the skin once a week. 09/14/23  Yes Runell Gess, MD  torsemide (DEMADEX) 20 MG tablet Take 20 mg by mouth daily.   Yes [provider]  XARELTO 20 MG TABS tablet TAKE 1 TABLET BY MOUTH DAILY WITH SUPPER 06/04/23  Yes Laurey Morale, MD  NON FORMULARY Pt uses a cpap nightly    [provider]   Social History   Socioeconomic History   Marital status: Married    Spouse name: Not on file   Number of children: Not on file   Years of education: Not on file   Highest education level: Not on file  Occupational History   Occupation: Public affairs consultant  Tobacco Use   Smoking status: Never    Passive exposure: Never   Smokeless tobacco: Never  Vaping Use   Vaping status: Never Used  Substance and Sexual Activity   Alcohol use: No   Drug use: No   Sexual activity: Yes  Other Topics Concern   Not on file  Social History Narrative   Pt lives in [place] with spouse.  Leadership training and behavioral safety training.   Social Drivers of Health   Financial Resource Strain: Medium Risk (07/23/2023)   Overall Financial Resource Strain (CARDIA)    Difficulty of Paying Living Expenses: Somewhat hard  Food Insecurity: Patient Declined (07/23/2023)   Hunger Vital Sign    Worried About Running Out of Food in the Last Year: Patient declined    Ran Out of Food in the Last Year: Patient declined  Transportation Needs: Patient Declined (07/23/2023)   PRAPARE - Therapist, art (Medical): Patient declined    Lack of Transportation (Non-Medical): Patient declined  Physical Activity: Unknown (07/23/2023)   Exercise Vital Sign    Days of Exercise per Week: 0 days    Minutes of Exercise per Session: Not on file  Stress: Stress Concern Present (07/23/2023)   [employer] of Occupational Health - Occupational Stress Questionnaire    Feeling of Stress : To some extent  Social Connections: Unknown (07/23/2023)   Social Connection and Isolation Panel [NHANES]    Frequency of Communication with Friends and Family: Patient declined    Frequency of Social Gatherings with Friends and Family: Patient declined  Attends Religious Services: Patient declined    Active Member of Clubs or Organizations: Yes    Attends Banker Meetings: Patient declined    Marital Status: Patient declined   Family History  Problem Relation Age of Onset   Heart Problems Mother    Arthritis Mother    Cancer Sister    Other Sister        bilateral hip replacements   Other Other        mother had a pacemaker   Healthy Son    Healthy Son    Healthy Daughter     ROS: Currently denies lightheadedness, dizziness, Fever, chills, CP, SOB.   No personal history of DVT, PE, MI, or CVA. No loose teeth or dentures All other systems have been reviewed and were otherwise currently negative with the exception of those mentioned in the HPI and as above.  Objective: Vitals: Ht: 5'9" Wt: 264.2 lbs Temp: 98.4 BP: 110/73 Pulse: 63 O2 98% on room air.   Physical Exam: General: Alert, NAD.  Antalgic Gait  HEENT: EOMI, Good Neck Extension  Pulm: No increased work of breathing.  Clear B/L A/P w/o crackle or wheeze.  CV: RRR, No m/g/r appreciated  GI: soft, NT, ND. BS x 4 quadrants Neuro: CN II-XII grossly intact without focal deficit.  Sensation intact distally Skin: No lesions in the area of chief complaint MSK/Surgical Site:  + JLT. ROM 0-120 degrees.  5/5 strength in  extension and flexion.  +EHL/FHL.  NVI.  Stable varus and valgus stress.    Imaging Review Plain radiographs demonstrate severe degenerative joint disease of the left knee.   The overall alignment ismild valgus. The bone quality appears to be fair for age and reported activity level.  Preoperative templating of the joint replacement has been completed, documented, and submitted to the Operating Room personnel in order to optimize intra-operative equipment management.  Assessment: OA LEFT KNEE Active Problems:   * No active hospital problems. *   Plan: Plan for Procedure(s): ARTHROPLASTY, KNEE, TOTAL  The patient history, physical exam, clinical judgement of the provider and imaging are consistent with end stage degenerative joint disease and total joint arthroplasty is deemed medically necessary. The treatment options including medical management, injection therapy, and arthroplasty were discussed at length. The risks and benefits of Procedure(s): ARTHROPLASTY, KNEE, TOTAL were presented and reviewed.  The risks of nonoperative treatment, versus surgical intervention including but not limited to continued pain, aseptic loosening, stiffness, dislocation/subluxation, infection, bleeding, nerve injury, blood clots, cardiopulmonary complications, morbidity, mortality, among others were discussed. The patient verbalizes understanding and wishes to proceed with the plan.  Patient is being admitted for inpatient treatment for surgery, pain control, PT, prophylactic antibiotics, VTE prophylaxis, progressive ambulation, ADL's and discharge planning.   Dental prophylaxis discussed and recommended for 2 years postoperatively.  The patient does meet the criteria for TXA which will be used perioperatively.   His normal daily dose of Xarelto 20mg  will be used postoperatively for DVT prophylaxis in addition to SCDs, and early ambulation. Plan for Tylenol and Hydrocodone for pain.   Robaxin for muscle  spasms.   Zofran for nausea and vomiting. Pharmacy- [facility] [address] [place] The patient is planning to be discharged home with OPPT and into the care of his wife Victorino Dike who can be reached at [phone] Follow up appt 10/28/23 at 4:15pm    Marzetta Board Office [phone] 10/08/2023 4:21 PM

## 2023-10-12 ENCOUNTER — Telehealth: Payer: Self-pay | Admitting: Cardiovascular Disease

## 2023-10-12 ENCOUNTER — Ambulatory Visit (HOSPITAL_COMMUNITY)
Admission: RE | Admit: 2023-10-12 | Discharge: 2023-10-12 | Disposition: A | Discharge status: Home / Self Care | Source: Ambulatory Visit | Attending: Cardiology | Admitting: Cardiology

## 2023-10-12 DIAGNOSIS — I5032 Chronic diastolic (congestive) heart failure: Secondary | ICD-10-CM

## 2023-10-12 DIAGNOSIS — Z0100 Encounter for examination of eyes and vision without abnormal findings: Secondary | ICD-10-CM | POA: Diagnosis not present

## 2023-10-12 LAB — ECHOCARDIOGRAM COMPLETE
AR max vel: 2.27 cm2
AV Area VTI: 2.56 cm2
AV Area mean vel: 2.34 cm2
AV Mean grad: 10 mmHg
AV Peak grad: 19.5 mmHg
Ao pk vel: 2.21 m/s
Area-P 1/2: 3.2 cm2
MV VTI: 2.36 cm2
S' Lateral: 4 cm

## 2023-10-12 NOTE — Anesthesia Preprocedure Evaluation (Addendum)
 Anesthesia Evaluation    Airway        Dental   Pulmonary           Cardiovascular hypertension,      Neuro/Psych    GI/Hepatic   Endo/Other    Renal/GU      Musculoskeletal   Abdominal   Peds  Hematology   Anesthesia Other Findings   Reproductive/Obstetrics                              Anesthesia Physical Anesthesia Plan  ASA:   Anesthesia Plan:    Post-op Pain Management:    Induction:   PONV Risk Score and Plan:   Airway Management Planned:   Additional Equipment:   Intra-op Plan:   Post-operative Plan:   Informed Consent:   Plan Discussed with:   Anesthesia Plan Comments: (See PAT note from 3/21)         Anesthesia Quick Evaluation

## 2023-10-12 NOTE — Telephone Encounter (Signed)
 Pt calming demanding to speak with someone regarding a echo  he was told needed to be done prior to surg and his surg is scheduled for tomorrow but he states he was already cleared by out office-[place]

## 2023-10-12 NOTE — Telephone Encounter (Signed)
 Spoke with pt over the phone and he stated that [place] from scheduling at [facility] called him and stated that our office had called and stated he needs a echo prior to his surgery tomorrow or it will be canceled. Pt was very upset about this. Explained to pt that I would call [facility] and figure out what is exactly needed. Tresa Endo from [employer] added me to secure chat where anesthesiology had been the one that originally messaged about the echo per Dr. Lovena Neighbours request.   Spoke with Dr. Lovena Neighbours nurse who added him to the secure chat and Dr. Lalla Brothers verified that pt needs echo prior to surgery. Echo schedulers added to chat and stated that no availability until tomorrow at 2 PM. Dr. Lalla Brothers stated that we could try to get pt in to office for same day appt to clear him. Information has been forwarded to Dr. Lovena Neighbours nurse.

## 2023-10-12 NOTE — Telephone Encounter (Signed)
 Left message for patient to call back. He has been scheduled for an echocardiogram at [facility] tomorrow at 3pm as a back up.

## 2023-10-13 ENCOUNTER — Encounter (HOSPITAL_COMMUNITY): Payer: Self-pay | Admitting: Orthopedic Surgery

## 2023-10-13 ENCOUNTER — Ambulatory Visit (HOSPITAL_COMMUNITY)

## 2023-10-13 ENCOUNTER — Encounter (HOSPITAL_COMMUNITY): Payer: Self-pay

## 2023-10-13 ENCOUNTER — Other Ambulatory Visit (HOSPITAL_COMMUNITY)

## 2023-10-13 ENCOUNTER — Ambulatory Visit (HOSPITAL_COMMUNITY)
Admission: RE | Admit: 2023-10-13 | Discharge: 2023-10-13 | Disposition: A | Discharge status: Home / Self Care | Source: Ambulatory Visit | Attending: Orthopedic Surgery | Admitting: Orthopedic Surgery

## 2023-10-13 ENCOUNTER — Ambulatory Visit (HOSPITAL_COMMUNITY): Payer: Self-pay | Admitting: Anesthesiology

## 2023-10-13 ENCOUNTER — Other Ambulatory Visit: Payer: Self-pay

## 2023-10-13 ENCOUNTER — Ambulatory Visit (HOSPITAL_COMMUNITY): Payer: Self-pay | Admitting: Physician Assistant

## 2023-10-13 ENCOUNTER — Encounter (HOSPITAL_COMMUNITY)
Admission: RE | Disposition: A | Discharge status: Home / Self Care | Payer: Self-pay | Source: Ambulatory Visit | Attending: Orthopedic Surgery

## 2023-10-13 DIAGNOSIS — Z471 Aftercare following joint replacement surgery: Secondary | ICD-10-CM | POA: Diagnosis not present

## 2023-10-13 DIAGNOSIS — Z95810 Presence of automatic (implantable) cardiac defibrillator: Secondary | ICD-10-CM | POA: Insufficient documentation

## 2023-10-13 DIAGNOSIS — Z7984 Long term (current) use of oral hypoglycemic drugs: Secondary | ICD-10-CM | POA: Diagnosis not present

## 2023-10-13 DIAGNOSIS — G8918 Other acute postprocedural pain: Secondary | ICD-10-CM | POA: Diagnosis not present

## 2023-10-13 DIAGNOSIS — Z7901 Long term (current) use of anticoagulants: Secondary | ICD-10-CM | POA: Diagnosis not present

## 2023-10-13 DIAGNOSIS — M1712 Unilateral primary osteoarthritis, left knee: Secondary | ICD-10-CM

## 2023-10-13 DIAGNOSIS — Z96652 Presence of left artificial knee joint: Secondary | ICD-10-CM | POA: Diagnosis not present

## 2023-10-13 DIAGNOSIS — M7989 Other specified soft tissue disorders: Secondary | ICD-10-CM | POA: Diagnosis not present

## 2023-10-13 DIAGNOSIS — Z79899 Other long term (current) drug therapy: Secondary | ICD-10-CM | POA: Diagnosis not present

## 2023-10-13 DIAGNOSIS — I110 Hypertensive heart disease with heart failure: Secondary | ICD-10-CM

## 2023-10-13 DIAGNOSIS — I509 Heart failure, unspecified: Secondary | ICD-10-CM | POA: Insufficient documentation

## 2023-10-13 DIAGNOSIS — Z6837 Body mass index (BMI) 37.0-37.9, adult: Secondary | ICD-10-CM | POA: Insufficient documentation

## 2023-10-13 DIAGNOSIS — G4733 Obstructive sleep apnea (adult) (pediatric): Secondary | ICD-10-CM | POA: Insufficient documentation

## 2023-10-13 DIAGNOSIS — I4891 Unspecified atrial fibrillation: Secondary | ICD-10-CM | POA: Insufficient documentation

## 2023-10-13 DIAGNOSIS — I5023 Acute on chronic systolic (congestive) heart failure: Secondary | ICD-10-CM | POA: Diagnosis not present

## 2023-10-13 DIAGNOSIS — I5043 Acute on chronic combined systolic (congestive) and diastolic (congestive) heart failure: Secondary | ICD-10-CM | POA: Diagnosis not present

## 2023-10-13 DIAGNOSIS — E669 Obesity, unspecified: Secondary | ICD-10-CM | POA: Diagnosis not present

## 2023-10-13 HISTORY — PX: TOTAL KNEE ARTHROPLASTY: SHX125

## 2023-10-13 SURGERY — ARTHROPLASTY, KNEE, TOTAL
Anesthesia: Monitor Anesthesia Care | Site: Knee | Laterality: Left

## 2023-10-13 MED ORDER — TRANEXAMIC ACID-NACL 1000-0.7 MG/100ML-% IV SOLN
INTRAVENOUS | Status: AC
  Filled 2023-10-13: qty 100

## 2023-10-13 MED ORDER — ACETAMINOPHEN 500 MG PO TABS: 1000.0000 mg | ORAL_TABLET | Freq: Once | ORAL | Status: DC

## 2023-10-13 MED ORDER — ONDANSETRON HCL 4 MG/2ML IJ SOLN
INTRAMUSCULAR | Status: AC
  Filled 2023-10-13: qty 2

## 2023-10-13 MED ORDER — ONDANSETRON HCL 4 MG/2ML IJ SOLN: 4.0000 mg | Freq: Once | INTRAMUSCULAR | Status: DC | PRN

## 2023-10-13 MED ORDER — LACTATED RINGERS IV SOLN: INTRAVENOUS | Status: DC

## 2023-10-13 MED ORDER — HYDROCODONE-ACETAMINOPHEN 10-325 MG PO TABS: 1.0000 | ORAL_TABLET | ORAL | 0 refills | Status: DC | PRN

## 2023-10-13 MED ORDER — HYDROCODONE-ACETAMINOPHEN 7.5-325 MG PO TABS
ORAL_TABLET | ORAL | Status: AC
  Filled 2023-10-13: qty 1

## 2023-10-13 MED ORDER — TRANEXAMIC ACID-NACL 1000-0.7 MG/100ML-% IV SOLN
1000.0000 mg | INTRAVENOUS | Status: AC
  Administered 2023-10-13: 1000 mg via INTRAVENOUS
  Filled 2023-10-13: qty 100

## 2023-10-13 MED ORDER — ACETAMINOPHEN 500 MG PO TABS: 500.0000 mg | ORAL_TABLET | Freq: Four times a day (QID) | ORAL | Status: DC

## 2023-10-13 MED ORDER — ONDANSETRON HCL 4 MG/2ML IJ SOLN
INTRAMUSCULAR | Status: DC | PRN
  Administered 2023-10-13: 4 mg via INTRAVENOUS

## 2023-10-13 MED ORDER — TRANEXAMIC ACID-NACL 1000-0.7 MG/100ML-% IV SOLN
1000.0000 mg | Freq: Once | INTRAVENOUS | Status: AC
  Administered 2023-10-13: 1000 mg via INTRAVENOUS

## 2023-10-13 MED ORDER — SODIUM CHLORIDE (PF) 0.9 % IJ SOLN
INTRAMUSCULAR | Status: DC | PRN
  Administered 2023-10-13: 80 mL

## 2023-10-13 MED ORDER — ORAL CARE MOUTH RINSE: 15.0000 mL | Freq: Once | OROMUCOSAL | Status: AC

## 2023-10-13 MED ORDER — PROPOFOL 1000 MG/100ML IV EMUL
INTRAVENOUS | Status: AC
  Filled 2023-10-13: qty 100

## 2023-10-13 MED ORDER — MIDAZOLAM HCL 2 MG/2ML IJ SOLN
1.0000 mg | Freq: Once | INTRAMUSCULAR | Status: AC
  Administered 2023-10-13: 2 mg via INTRAVENOUS
  Filled 2023-10-13: qty 2

## 2023-10-13 MED ORDER — DEXAMETHASONE SODIUM PHOSPHATE 10 MG/ML IJ SOLN
INTRAMUSCULAR | Status: DC | PRN
  Administered 2023-10-13: 10 mg

## 2023-10-13 MED ORDER — FENTANYL CITRATE PF 50 MCG/ML IJ SOSY
50.0000 ug | PREFILLED_SYRINGE | Freq: Once | INTRAMUSCULAR | Status: AC
  Administered 2023-10-13: 100 ug via INTRAVENOUS
  Filled 2023-10-13: qty 2

## 2023-10-13 MED ORDER — DEXAMETHASONE SODIUM PHOSPHATE 10 MG/ML IJ SOLN
INTRAMUSCULAR | Status: AC
  Filled 2023-10-13: qty 1

## 2023-10-13 MED ORDER — ACETAMINOPHEN 500 MG PO TABS: 500.0000 mg | ORAL_TABLET | Freq: Four times a day (QID) | ORAL | 0 refills | Status: AC | PRN

## 2023-10-13 MED ORDER — AMISULPRIDE (ANTIEMETIC) 5 MG/2ML IV SOLN: 10.0000 mg | Freq: Once | INTRAVENOUS | Status: DC | PRN

## 2023-10-13 MED ORDER — POVIDONE-IODINE 10 % EX SWAB
2.0000 | Freq: Once | CUTANEOUS | Status: AC
  Administered 2023-10-13: 2 via TOPICAL

## 2023-10-13 MED ORDER — LACTATED RINGERS IV BOLUS
500.0000 mL | Freq: Once | INTRAVENOUS | Status: AC
  Administered 2023-10-13: 500 mL via INTRAVENOUS

## 2023-10-13 MED ORDER — ACETAMINOPHEN 500 MG PO TABS
1000.0000 mg | ORAL_TABLET | Freq: Once | ORAL | Status: AC
  Administered 2023-10-13: 1000 mg via ORAL
  Filled 2023-10-13: qty 2

## 2023-10-13 MED ORDER — ONDANSETRON 4 MG PO TBDP: 4.0000 mg | ORAL_TABLET | Freq: Three times a day (TID) | ORAL | 0 refills | Status: AC | PRN

## 2023-10-13 MED ORDER — HYDROMORPHONE HCL 1 MG/ML IJ SOLN: 0.2500 mg | INTRAMUSCULAR | Status: DC | PRN

## 2023-10-13 MED ORDER — BUPIVACAINE LIPOSOME 1.3 % IJ SUSP
INTRAMUSCULAR | Status: AC
  Filled 2023-10-13: qty 20

## 2023-10-13 MED ORDER — BUPIVACAINE LIPOSOME 1.3 % IJ SUSP: 20.0000 mL | Freq: Once | INTRAMUSCULAR | Status: DC

## 2023-10-13 MED ORDER — LACTATED RINGERS IV BOLUS
250.0000 mL | Freq: Once | INTRAVENOUS | Status: AC
  Administered 2023-10-13: 250 mL via INTRAVENOUS

## 2023-10-13 MED ORDER — BUPIVACAINE IN DEXTROSE 0.75-8.25 % IT SOLN
INTRATHECAL | Status: DC | PRN
Start: 2023-10-13 — End: 2023-10-13
  Administered 2023-10-13: 1.6 mL via INTRATHECAL

## 2023-10-13 MED ORDER — WATER FOR IRRIGATION, STERILE IR SOLN
Status: DC | PRN
  Administered 2023-10-13: 1000 mL

## 2023-10-13 MED ORDER — METHOCARBAMOL 1000 MG/10ML IJ SOLN: 500.0000 mg | Freq: Four times a day (QID) | INTRAMUSCULAR | Status: DC | PRN

## 2023-10-13 MED ORDER — HYDROCODONE-ACETAMINOPHEN 7.5-325 MG PO TABS
ORAL_TABLET | ORAL | Status: DC
Start: 2023-10-13 — End: 2023-10-13
  Filled 2023-10-13: qty 1

## 2023-10-13 MED ORDER — BUPIVACAINE-EPINEPHRINE (PF) 0.25% -1:200000 IJ SOLN
INTRAMUSCULAR | Status: AC
  Filled 2023-10-13: qty 30

## 2023-10-13 MED ORDER — METHOCARBAMOL 750 MG PO TABS: 750.0000 mg | ORAL_TABLET | Freq: Three times a day (TID) | ORAL | 0 refills | Status: AC | PRN

## 2023-10-13 MED ORDER — METHOCARBAMOL 500 MG PO TABS
500.0000 mg | ORAL_TABLET | Freq: Four times a day (QID) | ORAL | Status: DC | PRN
  Administered 2023-10-13: 500 mg via ORAL

## 2023-10-13 MED ORDER — POVIDONE-IODINE 10 % EX SWAB: 2.0000 | Freq: Once | CUTANEOUS | Status: DC

## 2023-10-13 MED ORDER — CEFAZOLIN SODIUM-DEXTROSE 2-4 GM/100ML-% IV SOLN
2.0000 g | INTRAVENOUS | Status: AC
  Administered 2023-10-13: 2 g via INTRAVENOUS
  Filled 2023-10-13: qty 100

## 2023-10-13 MED ORDER — CEFAZOLIN SODIUM-DEXTROSE 2-4 GM/100ML-% IV SOLN: 2.0000 g | Freq: Four times a day (QID) | INTRAVENOUS | Status: DC

## 2023-10-13 MED ORDER — HYDROCODONE-ACETAMINOPHEN 5-325 MG PO TABS
ORAL_TABLET | ORAL | Status: AC
  Filled 2023-10-13: qty 1

## 2023-10-13 MED ORDER — SODIUM CHLORIDE 0.9 % IR SOLN
Status: DC | PRN
  Administered 2023-10-13: 1000 mL

## 2023-10-13 MED ORDER — MORPHINE SULFATE (PF) 2 MG/ML IV SOLN: 0.5000 mg | INTRAVENOUS | Status: DC | PRN

## 2023-10-13 MED ORDER — PROPOFOL 500 MG/50ML IV EMUL
INTRAVENOUS | Status: DC | PRN
  Administered 2023-10-13: 50 mg via INTRAVENOUS
  Administered 2023-10-13: 50 ug/kg/min via INTRAVENOUS

## 2023-10-13 MED ORDER — ROPIVACAINE HCL 5 MG/ML IJ SOLN
INTRAMUSCULAR | Status: DC | PRN
  Administered 2023-10-13: 30 mL via PERINEURAL

## 2023-10-13 MED ORDER — CHLORHEXIDINE GLUCONATE 0.12 % MT SOLN
15.0000 mL | Freq: Once | OROMUCOSAL | Status: AC
  Administered 2023-10-13: 15 mL via OROMUCOSAL

## 2023-10-13 MED ORDER — HYDROCODONE-ACETAMINOPHEN 7.5-325 MG PO TABS
1.0000 | ORAL_TABLET | ORAL | Status: DC | PRN
  Administered 2023-10-13: 1 via ORAL

## 2023-10-13 MED ORDER — DEXAMETHASONE SODIUM PHOSPHATE 10 MG/ML IJ SOLN
8.0000 mg | Freq: Once | INTRAMUSCULAR | Status: AC
  Administered 2023-10-13: 10 mg via INTRAVENOUS

## 2023-10-13 MED ORDER — METHOCARBAMOL 500 MG PO TABS
ORAL_TABLET | ORAL | Status: AC
  Filled 2023-10-13: qty 1

## 2023-10-13 MED ORDER — HYDROCODONE-ACETAMINOPHEN 7.5-325 MG PO TABS
1.0000 | ORAL_TABLET | Freq: Once | ORAL | Status: AC | PRN
  Administered 2023-10-13: 1 via ORAL

## 2023-10-13 MED ORDER — SODIUM CHLORIDE (PF) 0.9 % IJ SOLN
INTRAMUSCULAR | Status: AC
  Filled 2023-10-13: qty 30

## 2023-10-13 MED ORDER — CARVEDILOL 12.5 MG PO TABS
6.2500 mg | ORAL_TABLET | Freq: Once | ORAL | Status: AC
  Administered 2023-10-13: 6.25 mg via ORAL
  Filled 2023-10-13: qty 1

## 2023-10-13 MED ORDER — 0.9 % SODIUM CHLORIDE (POUR BTL) OPTIME
TOPICAL | Status: DC | PRN
  Administered 2023-10-13: 1000 mL

## 2023-10-13 SURGICAL SUPPLY — 47 items
BAG COUNTER SPONGE SURGICOUNT (BAG) IMPLANT
BLADE SAG 18X100X1.27 (BLADE) ×1 IMPLANT
BLADE SAGITTAL 25.0X1.37X90 (BLADE) ×1 IMPLANT
BLADE SURG 15 STRL LF DISP TIS (BLADE) ×1 IMPLANT
BNDG ELASTIC 6X10 VLCR STRL LF (GAUZE/BANDAGES/DRESSINGS) ×1 IMPLANT
BOWL SMART MIX CTS (DISPOSABLE) IMPLANT
CEMENT BONE SIMPLEX SPEEDSET (Cement) IMPLANT
CLSR STERI-STRIP ANTIMIC 1/2X4 (GAUZE/BANDAGES/DRESSINGS) ×2 IMPLANT
COMP FEM SZ5 CRUC LEFT RETAIN (Orthopedic Implant) ×1 IMPLANT
COMPONENT FEM SZ5 CRU LT RETN (Orthopedic Implant) IMPLANT
COVER SURGICAL LIGHT HANDLE (MISCELLANEOUS) ×1 IMPLANT
CUFF TRNQT CYL 34X4.125X (TOURNIQUET CUFF) ×1 IMPLANT
DRAPE U-SHAPE 47X51 STRL (DRAPES) ×1 IMPLANT
DRSG MEPILEX POST OP 4X12 (GAUZE/BANDAGES/DRESSINGS) ×1 IMPLANT
DURAPREP 26ML APPLICATOR (WOUND CARE) ×2 IMPLANT
ELECT REM PT RETURN 15FT ADLT (MISCELLANEOUS) ×1 IMPLANT
GLOVE BIO SURGEON STRL SZ7.5 (GLOVE) ×2 IMPLANT
GLOVE BIOGEL PI IND STRL 7.5 (GLOVE) ×1 IMPLANT
GLOVE BIOGEL PI IND STRL 8 (GLOVE) ×1 IMPLANT
GLOVE SURG SYN 7.0 (GLOVE) ×1 IMPLANT
GLOVE SURG SYN 7.0 PF PI (GLOVE) ×1 IMPLANT
GOWN STRL REUS W/ TWL LRG LVL3 (GOWN DISPOSABLE) ×1 IMPLANT
GOWN STRL REUS W/ TWL XL LVL3 (GOWN DISPOSABLE) ×1 IMPLANT
HOLDER FOLEY CATH W/STRAP (MISCELLANEOUS) IMPLANT
IMMOBILIZER KNEE 20 (SOFTGOODS) ×1 IMPLANT
IMMOBILIZER KNEE 20 THIGH 36 (SOFTGOODS) IMPLANT
IMMOBILIZER KNEE 22 UNIV (SOFTGOODS) IMPLANT
INSERT TRIATHLON CS TIB X3 9 (Joint) IMPLANT
KIT TURNOVER KIT A (KITS) IMPLANT
KNEE PATELLA ASYMMETRIC 10X32 (Knees) IMPLANT
KNEE TIBIAL COMPONENT SZ6 (Knees) IMPLANT
MANIFOLD NEPTUNE II (INSTRUMENTS) ×1 IMPLANT
NS IRRIG 1000ML POUR BTL (IV SOLUTION) ×1 IMPLANT
PACK TOTAL KNEE CUSTOM (KITS) ×1 IMPLANT
PIN FLUTED HEDLESS FIX 3.5X1/8 (PIN) IMPLANT
PROTECTOR NERVE ULNAR (MISCELLANEOUS) ×1 IMPLANT
SET HNDPC FAN SPRY TIP SCT (DISPOSABLE) ×1 IMPLANT
SPIKE FLUID TRANSFER (MISCELLANEOUS) ×1 IMPLANT
SUT MNCRL AB 3-0 PS2 18 (SUTURE) ×1 IMPLANT
SUT MNCRL AB 3-0 PS2 27 (SUTURE) IMPLANT
SUT VIC AB 0 CT1 36 (SUTURE) ×1 IMPLANT
SUT VIC AB 1 CT1 36 (SUTURE) ×1 IMPLANT
SUT VIC AB 2-0 CT1 TAPERPNT 27 (SUTURE) ×1 IMPLANT
TOWEL GREEN STERILE FF (TOWEL DISPOSABLE) ×1 IMPLANT
TRAY FOLEY MTR SLVR 16FR STAT (SET/KITS/TRAYS/PACK) IMPLANT
TUBE SUCTION HIGH CAP CLEAR NV (SUCTIONS) ×1 IMPLANT
WRAP KNEE MAXI GEL POST OP (GAUZE/BANDAGES/DRESSINGS) ×1 IMPLANT

## 2023-10-13 NOTE — Op Note (Addendum)
 DATE OF SURGERY:  10/13/2023 TIME: 8:53 AM  PATIENT NAME:  [name]   AGE: 73 y.o.    PRE-OPERATIVE DIAGNOSIS:  OA LEFT KNEE  POST-OPERATIVE DIAGNOSIS:  Same  PROCEDURE:  Procedure(s): ARTHROPLASTY, KNEE, TOTAL   SURGEON:  Sheral Apley, MD   ASSISTANT:  Levester Fresh, PA-C, she was present and scrubbed throughout the case, critical for completion in a timely fashion, and for retraction, instrumentation, and closure.    OPERATIVE IMPLANTS: [employer]ker Triathlon CR. Press fit knee  Femur size 5, Tibia size 6, Patella size 29 3-peg oval button, with a 9 mm polyethylene insert.   PREOPERATIVE INDICATIONS:  DAINE GUNTHER is a 73 y.o. year old male with end stage bone on bone degenerative arthritis of the knee who failed conservative treatment, including injections, antiinflammatories, activity modification, and assistive devices, and had significant impairment of their activities of daily living, and elected for Total Knee Arthroplasty.   The risks, benefits, and alternatives were discussed at length including but not limited to the risks of infection, bleeding, nerve injury, stiffness, blood clots, the need for revision surgery, cardiopulmonary complications, among others, and they were willing to proceed.   OPERATIVE DESCRIPTION:  The patient was brought to the operative room and placed in a supine position.  General anesthesia was administered.  IV antibiotics were given.  The lower extremity was prepped and draped in the usual sterile fashion.  Time out was performed.  The leg was elevated and exsanguinated and the tourniquet was inflated.  Anterior approach was performed.  The patella was everted and osteophytes were removed.  The anterior horn of the medial and lateral meniscus was removed.   The distal femur was opened with the drill and the intramedullary distal femoral cutting jig was utilized, set at 5 degrees resecting 8 mm off the distal femur.  Care was taken to  protect the collateral ligaments.  The distal femoral sizing jig was applied, taking care to avoid notching.  Then the 4-in-1 cutting jig was applied and the anterior and posterior femur was cut, along with the chamfer cuts.  All posterior osteophytes were removed.  The flexion gap was then measured and was symmetric with the extension gap.  Then the extramedullary tibial cutting jig was utilized making the appropriate cut using the anterior tibial crest as a reference building in appropriate posterior slope.  Care was taken during the cut to protect the medial and collateral ligaments.  The proximal tibia was removed along with the posterior horns of the menisci.    I completed the distal femoral preparation using the appropriate jig to prepare the box.  The patella was then measured, and cut with the saw.    The proximal tibia sized and prepared accordingly with the reamer and the punch, and then all components were trialed with the above sized poly insert.  The knee was found to have excellent balance and full motion.    The above named components were then impacted into place and Poly tibial piece and patella were inserted.  I was very happy with his stability and ROM  I performed a periarticular injection with Exparel  The knee was easily taken through a range of motion and the patella tracked well and the knee irrigated copiously and the parapatellar and subcutaneous tissue closed with vicryl, and monocryl with steri strips for the skin.  The incision was dressed with sterile gauze and the tourniquet released and the patient was awakened and returned to the  PACU in stable and satisfactory condition.  There were no complications.  Total tourniquet time was roughly 75 minutes.   POSTOPERATIVE PLAN: post op Abx, DVT px: SCD's, TED's, Early ambulation and chemical px

## 2023-10-13 NOTE — Anesthesia Procedure Notes (Signed)
 Spinal  Patient location during procedure: OR Start time: 10/13/2023 8:37 AM End time: 10/13/2023 8:40 AM Reason for block: surgical anesthesia Staffing Performed: anesthesiologist  Anesthesiologist: Lannie Fields, DO Performed by: Lannie Fields, DO Authorized by: Lannie Fields, DO   Preanesthetic Checklist Completed: patient identified, IV checked, risks and benefits discussed, surgical consent, monitors and equipment checked, pre-op evaluation and timeout performed Spinal Block Patient position: sitting Prep: DuraPrep and site prepped and draped Patient monitoring: cardiac monitor, continuous pulse ox and blood pressure Approach: midline Location: L3-4 Injection technique: single-shot Needle Needle type: Pencan  Needle gauge: 24 G Needle length: 9 cm Assessment Sensory level: T6 Events: CSF return Additional Notes Functioning IV was confirmed and monitors were applied. Sterile prep and drape, including hand hygiene and sterile gloves were used. The patient was positioned and the spine was prepped. The skin was anesthetized with lidocaine.  Free flow of clear CSF was obtained prior to injecting local anesthetic into the CSF.  The spinal needle aspirated freely following injection.  The needle was carefully withdrawn.  The patient tolerated the procedure well.

## 2023-10-13 NOTE — Transfer of Care (Signed)
 Immediate Anesthesia Transfer of Care Note  Patient: [name]  Procedure(s) Performed: ARTHROPLASTY, KNEE, TOTAL (Left: Knee)  Patient Location: PACU  Anesthesia Type:MAC and Spinal  Level of Consciousness: awake, alert , and oriented  Airway & Oxygen Therapy: Patient Spontanous Breathing and Patient connected to face mask oxygen  Post-op Assessment: Report given to RN and Post -op Vital signs reviewed and stable  Post vital signs: Reviewed and stable  Last Vitals:  Vitals Value Taken Time  BP    Temp    Pulse 60 10/13/23 1040  Resp 14 10/13/23 1040  SpO2 99 % 10/13/23 1040  Vitals shown include unfiled device data.  Last Pain:  Vitals:   10/13/23 0755  TempSrc:   PainSc: 0-No pain         Complications: No notable events documented.

## 2023-10-13 NOTE — Anesthesia Postprocedure Evaluation (Signed)
 Anesthesia Post Note  Patient: [name]  Procedure(s) Performed: ARTHROPLASTY, KNEE, TOTAL (Left: Knee)     Patient location during evaluation: PACU Anesthesia Type: Regional, MAC and Spinal Level of consciousness: awake and alert and oriented Pain management: pain level controlled Vital Signs Assessment: post-procedure vital signs reviewed and stable Respiratory status: spontaneous breathing, nonlabored ventilation and respiratory function stable Cardiovascular status: blood pressure returned to baseline and stable Postop Assessment: no headache, no backache and spinal receding Anesthetic complications: no   No notable events documented.  Last Vitals:  Vitals:   10/13/23 1043 10/13/23 1045  BP:  119/80  Pulse:  60  Resp:  10  Temp:    SpO2: 99% 96%    Last Pain:  Vitals:   10/13/23 1149  TempSrc:   PainSc: 4                  Lannie Fields

## 2023-10-13 NOTE — Anesthesia Procedure Notes (Signed)
 Anesthesia Regional Block: Adductor canal block   Pre-Anesthetic Checklist: , timeout performed,  Correct Patient, Correct Site, Correct Laterality,  Correct Procedure, Correct Position, site marked,  Risks and benefits discussed,  Surgical consent,  Pre-op evaluation,  At surgeon's request and post-op pain management  Laterality: Left  Prep: Maximum Sterile Barrier Precautions used, chloraprep       Needles:  Injection technique: Single-shot  Needle Type: Echogenic Stimulator Needle     Needle Length: 9cm  Needle Gauge: 22     Additional Needles:   Procedures:,,,, ultrasound used (permanent image in chart),,    Narrative:  Start time: 10/13/2023 8:10 AM End time: 10/13/2023 8:15 AM Injection made incrementally with aspirations every 5 mL.  Performed by: Personally  Anesthesiologist: Lannie Fields, DO  Additional Notes: Monitors applied. No increased pain on injection. No increased resistance to injection. Injection made in 5cc increments. Good needle visualization. Patient tolerated procedure well.

## 2023-10-13 NOTE — Evaluation (Signed)
 Physical Therapy Evaluation Patient Details Name: [name] MRN: [id number] DOB: [birth year] Today's Date: 10/13/2023  History of Present Illness  Pt is 73 yo male s/p L TKA on 10/13/23.  Pt with hx including but not limited to AICD, arthritis, CHF, HTN, neuropathy, OSA, afib, mitral valve repair, MAZE procedure, Bil THA, and R TKA  Clinical Impression  Pt is s/p TKA resulting in the deficits listed below (see PT Problem List). PT with orders to see pt for possible SDDC.  At baseline, pt independent.  He has home support and DME , with 3 steps to enter with rails.  Pt with good quad activation, pain control/tolerance, ROM, and understanding of HEP.  He reported pain at 6/10 but tolerated activity well without antalgic pattern.  Pt ambulating 100' with RW and performed stairs similar to home set up.  He has outpt PT scheduled later this week.    Pt demonstrates safe gait & transfers in order to return home from PT perspective once discharged by MD.  While in hospital, will continue to benefit from PT for skilled therapy to advance mobility and exercises.  Pt was not able to urinate during PT session - notified RN.          If plan is discharge home, recommend the following: A little help with walking and/or transfers;A little help with bathing/dressing/bathroom;Assistance with cooking/housework;Help with stairs or ramp for entrance   Can travel by private vehicle        Equipment Recommendations None recommended by PT  Recommendations for Other Services       Functional Status Assessment Patient has had a recent decline in their functional status and demonstrates the ability to make significant improvements in function in a reasonable and predictable amount of time.     Precautions / Restrictions Precautions Precautions: Fall;Knee Required Braces or Orthoses: Knee Immobilizer - Left Knee Immobilizer - Left: Discontinue once straight leg raise with < 10 degree lag Restrictions Weight  Bearing Restrictions Per Provider Order: Yes LLE Weight Bearing Per Provider Order: Weight bearing as tolerated      Mobility  Bed Mobility Overal bed mobility: Needs Assistance Bed Mobility: Supine to Sit, Sit to Supine     Supine to sit: Supervision Sit to supine: Supervision        Transfers Overall transfer level: Needs assistance Equipment used: Rolling walker (2 wheels) Transfers: Sit to/from Stand Sit to Stand: Contact guard assist, Supervision           General transfer comment: CGA progressed to supervision; stood from stretcher x 1 and recliner x 2; good carryover for hand placement and L LE management    Ambulation/Gait Ambulation/Gait assistance: Contact guard assist Gait Distance (Feet): 100 Feet Assistive device: Rolling walker (2 wheels) Gait Pattern/deviations: Step-through pattern, Decreased stride length, Decreased weight shift to left Gait velocity: decreased     General Gait Details: steady wtih RW progressing from step to to step through pattern  Stairs Stairs: Yes Stairs assistance: Contact guard assist Stair Management: Step to pattern, Forwards, Two rails Number of Stairs: 4 General stair comments: tolerated well, steady, educated on sequencing  Wheelchair Mobility     Tilt Bed    Modified Rankin (Stroke Patients Only)       Balance Overall balance assessment: Needs assistance Sitting-balance support: No upper extremity supported Sitting balance-Leahy Scale: Good     Standing balance support: Bilateral upper extremity supported, No upper extremity supported Standing balance-Leahy Scale: Fair Standing balance comment: RW to  ambulate but could static stand without AD                             Pertinent Vitals/Pain Pain Assessment Pain Assessment: 0-10 Pain Score: 6  Pain Location: L knee Pain Descriptors / Indicators: Throbbing Pain Intervention(s): Limited activity within patient's tolerance, Monitored  during session, Premedicated before session, Ice applied    Home Living Family/patient expects to be discharged to:: Private residence Living Arrangements: Spouse/significant other Available Help at Discharge: Family;Available 24 hours/day Type of Home: House Home Access: Stairs to enter Entrance Stairs-Rails: Right;Left;Can reach both Entrance Stairs-Number of Steps: 3   Home Layout: One level Home Equipment: Agricultural consultant (2 wheels);Cane - single point;Crutches;Grab bars - tub/shower      Prior Function Prior Level of Function : Independent/Modified Independent;Driving             Mobility Comments: Could ambulate in community without AD       Extremity/Trunk Assessment   Upper Extremity Assessment Upper Extremity Assessment: Overall WFL for tasks assessed    Lower Extremity Assessment Lower Extremity Assessment: LLE deficits/detail;RLE deficits/detail RLE Deficits / Details: ROM WFL; MMT 5/5 RLE Sensation: WNL LLE Deficits / Details: Expected post op changes; ROM : knee grossly 0-85 degrees; MMT: ankle 5/5, knee and hip 3/5 not further tested; able to SLR without ext lag LLE Sensation: WNL    Cervical / Trunk Assessment Cervical / Trunk Assessment: Normal  Communication        Cognition Arousal: Alert Behavior During Therapy: WFL for tasks assessed/performed   PT - Cognitive impairments: No apparent impairments                                 Cueing       General Comments  Educated on safe ice use, no pivots, car transfers, resting with leg straight, and TED hose during day. Also, encouraged walking every 1-2 hours during day. Educated on HEP with focus on mobility the first weeks. Discussed doing exercises within pain control and if pain increasing could decreased ROM, reps, and stop exercises as needed. Encouraged to perform quad sets and ankle pumps frequently for blood flow and to promote full knee extension.     Exercises Total Joint  Exercises Ankle Circles/Pumps: AROM, Both, 10 reps, Supine Quad Sets: AROM, Both, 10 reps, Supine Heel Slides: AAROM, Left, 5 reps, Supine Hip ABduction/ADduction: AAROM, Left, 5 reps, Supine Long Arc Quad: AROM, Left, 5 reps, Seated Knee Flexion: AROM, Left, 5 reps, Seated Other Exercises Other Exercises: Educated on AAROm techniques as needed   Assessment/Plan    PT Assessment Patient needs continued PT services  PT Problem List Decreased strength;Pain;Decreased range of motion;Decreased activity tolerance;Decreased knowledge of use of DME;Decreased balance;Decreased mobility       PT Treatment Interventions DME instruction;Therapeutic exercise;Gait training;Balance training;Stair training;Functional mobility training;Therapeutic activities;Patient/family education;Modalities    PT Goals (Current goals can be found in the Care Plan section)  Acute Rehab PT Goals Patient Stated Goal: return home PT Goal Formulation: With patient/family Time For Goal Achievement: 10/27/23 Potential to Achieve Goals: Good    Frequency 7X/week     Co-evaluation               AM-PAC PT "6 Clicks" Mobility  Outcome Measure Help needed turning from your back to your side while in a flat bed without using bedrails?: A  Little Help needed moving from lying on your back to sitting on the side of a flat bed without using bedrails?: A Little Help needed moving to and from a bed to a chair (including a wheelchair)?: A Little Help needed standing up from a chair using your arms (e.g., wheelchair or bedside chair)?: A Little Help needed to walk in hospital room?: A Little Help needed climbing 3-5 steps with a railing? : A Little 6 Click Score: 18    End of Session Equipment Utilized During Treatment: Gait belt Activity Tolerance: Patient tolerated treatment well Patient left: in chair;with family/visitor present (in PACU) Nurse Communication: Mobility status PT Visit Diagnosis: Other  abnormalities of gait and mobility (R26.89);Muscle weakness (generalized) (M62.81)    Time: 4098-1191 PT Time Calculation (min) (ACUTE ONLY): 30 min   Charges:   PT Evaluation $PT Eval Low Complexity: 1 Low PT Treatments $Gait Training: 8-22 mins PT General Charges $$ ACUTE PT VISIT: 1 Visit         Anise Salvo, PT Acute Rehab [facility] [phone]   Rayetta Humphrey 10/13/2023, 3:54 PM

## 2023-10-13 NOTE — Interval H&P Note (Signed)
 History and Physical Interval Note:  10/13/2023 6:57 AM  Margret Chance  has presented today for surgery, with the diagnosis of OA LEFT KNEE.  The various methods of treatment have been discussed with the patient and family. After consideration of risks, benefits and other options for treatment, the patient has consented to  Procedure(s): ARTHROPLASTY, KNEE, TOTAL (Left) as a surgical intervention.  The patient's history has been reviewed, patient examined, no change in status, stable for surgery.  I have reviewed the patient's chart and labs.  Questions were answered to the patient's satisfaction.     Sheral Apley

## 2023-10-13 NOTE — Discharge Instructions (Addendum)

## 2023-10-14 ENCOUNTER — Encounter (HOSPITAL_COMMUNITY): Payer: Self-pay | Admitting: Orthopedic Surgery

## 2023-10-15 ENCOUNTER — Telehealth: Payer: Self-pay | Admitting: Pharmacist

## 2023-10-15 ENCOUNTER — Other Ambulatory Visit: Payer: Self-pay

## 2023-10-15 ENCOUNTER — Other Ambulatory Visit (HOSPITAL_COMMUNITY): Payer: Self-pay

## 2023-10-15 DIAGNOSIS — G4733 Obstructive sleep apnea (adult) (pediatric): Secondary | ICD-10-CM

## 2023-10-15 DIAGNOSIS — E669 Obesity, unspecified: Secondary | ICD-10-CM

## 2023-10-15 MED ORDER — ZEPBOUND 7.5 MG/0.5ML ~~LOC~~ SOAJ
7.5000 mg | SUBCUTANEOUS | 0 refills | Status: DC
  Filled 2023-10-15: qty 2, 28d supply, fill #0

## 2023-10-15 NOTE — Telephone Encounter (Signed)
 Patient called, requesting next dose of Zepbound. Rx sent to [facility]

## 2023-10-17 ENCOUNTER — Other Ambulatory Visit: Payer: Self-pay | Admitting: Cardiology

## 2023-10-19 DIAGNOSIS — R262 Difficulty in walking, not elsewhere classified: Secondary | ICD-10-CM | POA: Diagnosis not present

## 2023-10-19 DIAGNOSIS — M6281 Muscle weakness (generalized): Secondary | ICD-10-CM | POA: Diagnosis not present

## 2023-10-19 DIAGNOSIS — M1712 Unilateral primary osteoarthritis, left knee: Secondary | ICD-10-CM | POA: Diagnosis not present

## 2023-10-19 DIAGNOSIS — M25662 Stiffness of left knee, not elsewhere classified: Secondary | ICD-10-CM | POA: Diagnosis not present

## 2023-10-20 NOTE — Addendum Note (Signed)
 Addended by: Geralyn Flash D on: 10/20/2023 10:02 AM   Modules accepted: Orders

## 2023-10-20 NOTE — Progress Notes (Signed)
 Remote ICD transmission.

## 2023-10-26 ENCOUNTER — Emergency Department (HOSPITAL_COMMUNITY)

## 2023-10-26 ENCOUNTER — Ambulatory Visit: Attending: Cardiology

## 2023-10-26 ENCOUNTER — Observation Stay (HOSPITAL_COMMUNITY)
Admission: EM | Admit: 2023-10-26 | Discharge: 2023-10-27 | Disposition: A | Discharge status: Home / Self Care | Attending: Family Medicine | Admitting: Family Medicine

## 2023-10-26 ENCOUNTER — Other Ambulatory Visit: Payer: Self-pay

## 2023-10-26 ENCOUNTER — Encounter (HOSPITAL_COMMUNITY): Payer: Self-pay

## 2023-10-26 ENCOUNTER — Ambulatory Visit: Payer: Self-pay

## 2023-10-26 DIAGNOSIS — R112 Nausea with vomiting, unspecified: Secondary | ICD-10-CM | POA: Diagnosis not present

## 2023-10-26 DIAGNOSIS — R1013 Epigastric pain: Principal | ICD-10-CM

## 2023-10-26 DIAGNOSIS — R079 Chest pain, unspecified: Secondary | ICD-10-CM | POA: Diagnosis not present

## 2023-10-26 DIAGNOSIS — I5022 Chronic systolic (congestive) heart failure: Secondary | ICD-10-CM

## 2023-10-26 DIAGNOSIS — I160 Hypertensive urgency: Secondary | ICD-10-CM | POA: Diagnosis not present

## 2023-10-26 DIAGNOSIS — I4821 Permanent atrial fibrillation: Secondary | ICD-10-CM

## 2023-10-26 DIAGNOSIS — R7989 Other specified abnormal findings of blood chemistry: Secondary | ICD-10-CM | POA: Diagnosis not present

## 2023-10-26 DIAGNOSIS — R1084 Generalized abdominal pain: Secondary | ICD-10-CM | POA: Diagnosis not present

## 2023-10-26 DIAGNOSIS — I161 Hypertensive emergency: Principal | ICD-10-CM

## 2023-10-26 DIAGNOSIS — Z952 Presence of prosthetic heart valve: Secondary | ICD-10-CM | POA: Insufficient documentation

## 2023-10-26 DIAGNOSIS — I110 Hypertensive heart disease with heart failure: Secondary | ICD-10-CM | POA: Insufficient documentation

## 2023-10-26 DIAGNOSIS — N179 Acute kidney failure, unspecified: Secondary | ICD-10-CM

## 2023-10-26 DIAGNOSIS — I517 Cardiomegaly: Secondary | ICD-10-CM | POA: Diagnosis not present

## 2023-10-26 DIAGNOSIS — Z96653 Presence of artificial knee joint, bilateral: Secondary | ICD-10-CM | POA: Diagnosis not present

## 2023-10-26 DIAGNOSIS — K429 Umbilical hernia without obstruction or gangrene: Secondary | ICD-10-CM | POA: Diagnosis not present

## 2023-10-26 DIAGNOSIS — G4733 Obstructive sleep apnea (adult) (pediatric): Secondary | ICD-10-CM | POA: Insufficient documentation

## 2023-10-26 DIAGNOSIS — T887XXA Unspecified adverse effect of drug or medicament, initial encounter: Secondary | ICD-10-CM

## 2023-10-26 DIAGNOSIS — Z8679 Personal history of other diseases of the circulatory system: Secondary | ICD-10-CM | POA: Diagnosis not present

## 2023-10-26 DIAGNOSIS — Z9889 Other specified postprocedural states: Secondary | ICD-10-CM

## 2023-10-26 DIAGNOSIS — Z79899 Other long term (current) drug therapy: Secondary | ICD-10-CM | POA: Diagnosis not present

## 2023-10-26 DIAGNOSIS — K30 Functional dyspepsia: Secondary | ICD-10-CM

## 2023-10-26 DIAGNOSIS — Z95810 Presence of automatic (implantable) cardiac defibrillator: Secondary | ICD-10-CM

## 2023-10-26 DIAGNOSIS — I10 Essential (primary) hypertension: Secondary | ICD-10-CM | POA: Diagnosis not present

## 2023-10-26 DIAGNOSIS — I2510 Atherosclerotic heart disease of native coronary artery without angina pectoris: Secondary | ICD-10-CM | POA: Diagnosis not present

## 2023-10-26 DIAGNOSIS — R109 Unspecified abdominal pain: Secondary | ICD-10-CM | POA: Diagnosis not present

## 2023-10-26 DIAGNOSIS — I5032 Chronic diastolic (congestive) heart failure: Secondary | ICD-10-CM | POA: Diagnosis not present

## 2023-10-26 DIAGNOSIS — N2889 Other specified disorders of kidney and ureter: Secondary | ICD-10-CM | POA: Diagnosis not present

## 2023-10-26 DIAGNOSIS — N281 Cyst of kidney, acquired: Secondary | ICD-10-CM | POA: Diagnosis not present

## 2023-10-26 DIAGNOSIS — N400 Enlarged prostate without lower urinary tract symptoms: Secondary | ICD-10-CM | POA: Diagnosis not present

## 2023-10-26 DIAGNOSIS — R0789 Other chest pain: Secondary | ICD-10-CM

## 2023-10-26 LAB — TROPONIN I (HIGH SENSITIVITY)
Troponin I (High Sensitivity): 32 ng/L — ABNORMAL HIGH (ref <18)
Troponin I (High Sensitivity): 41 ng/L — ABNORMAL HIGH (ref <18)
Troponin I (High Sensitivity): 66 ng/L — ABNORMAL HIGH (ref <18)
Troponin I (High Sensitivity): 68 ng/L — ABNORMAL HIGH (ref <18)

## 2023-10-26 LAB — BASIC METABOLIC PANEL WITH GFR
Anion gap: 17 — ABNORMAL HIGH (ref 5–15)
BUN: 29 mg/dL — ABNORMAL HIGH (ref 8–23)
CO2: 24 mmol/L (ref 22–32)
Calcium: 10.7 mg/dL — ABNORMAL HIGH (ref 8.9–10.3)
Chloride: 95 mmol/L — ABNORMAL LOW (ref 98–111)
Creatinine, Ser: 1.48 mg/dL — ABNORMAL HIGH (ref 0.61–1.24)
GFR, Estimated: 51 mL/min — ABNORMAL LOW (ref >60)
Glucose, Bld: 149 mg/dL — ABNORMAL HIGH (ref 70–99)
Potassium: 3.5 mmol/L (ref 3.5–5.1)
Sodium: 136 mmol/L (ref 135–145)

## 2023-10-26 LAB — CBC
HCT: 45.2 % (ref 39.0–52.0)
Hemoglobin: 15 g/dL (ref 13.0–17.0)
MCH: 28.2 pg (ref 26.0–34.0)
MCHC: 33.2 g/dL (ref 30.0–36.0)
MCV: 85.1 fL (ref 80.0–100.0)
Platelets: 301 10*3/uL (ref 150–400)
RBC: 5.31 MIL/uL (ref 4.22–5.81)
RDW: 14.1 % (ref 11.5–15.5)
WBC: 11.3 10*3/uL — ABNORMAL HIGH (ref 4.0–10.5)
nRBC: 0 % (ref 0.0–0.2)

## 2023-10-26 LAB — HEPATIC FUNCTION PANEL
ALT: 36 U/L (ref 0–44)
AST: 34 U/L (ref 15–41)
Albumin: 4.8 g/dL (ref 3.5–5.0)
Alkaline Phosphatase: 63 U/L (ref 38–126)
Bilirubin, Direct: 0.2 mg/dL (ref 0.0–0.2)
Indirect Bilirubin: 1.5 mg/dL — ABNORMAL HIGH (ref 0.3–0.9)
Total Bilirubin: 1.7 mg/dL — ABNORMAL HIGH (ref 0.0–1.2)
Total Protein: 8.2 g/dL — ABNORMAL HIGH (ref 6.5–8.1)

## 2023-10-26 LAB — BRAIN NATRIURETIC PEPTIDE: B Natriuretic Peptide: 461 pg/mL — ABNORMAL HIGH (ref 0.0–100.0)

## 2023-10-26 LAB — URINALYSIS, ROUTINE W REFLEX MICROSCOPIC
Bacteria, UA: NONE SEEN
Bilirubin Urine: NEGATIVE
Glucose, UA: >=500 mg/dL — AB
Hgb urine dipstick: NEGATIVE
Ketones, ur: 20 mg/dL — AB
Leukocytes,Ua: NEGATIVE
Nitrite: NEGATIVE
Protein, ur: 30 mg/dL — AB
Specific Gravity, Urine: 1.016 (ref 1.005–1.030)
pH: 5 (ref 5.0–8.0)

## 2023-10-26 LAB — LIPASE, BLOOD: Lipase: 35 U/L (ref 11–51)

## 2023-10-26 LAB — D-DIMER, QUANTITATIVE: D-Dimer, Quant: 1.69 ug{FEU}/mL — ABNORMAL HIGH (ref 0.00–0.50)

## 2023-10-26 MED ORDER — IOHEXOL 300 MG/ML  SOLN
100.0000 mL | Freq: Once | INTRAMUSCULAR | Status: AC | PRN
  Administered 2023-10-26: 100 mL via INTRAVENOUS

## 2023-10-26 MED ORDER — FAMOTIDINE IN NACL 20-0.9 MG/50ML-% IV SOLN
20.0000 mg | Freq: Once | INTRAVENOUS | Status: AC
  Administered 2023-10-26: 20 mg via INTRAVENOUS
  Filled 2023-10-26: qty 50

## 2023-10-26 MED ORDER — FAMOTIDINE 20 MG PO TABS: 40.0000 mg | ORAL_TABLET | Freq: Every day | ORAL | Status: DC

## 2023-10-26 MED ORDER — SODIUM CHLORIDE 0.9 % IV BOLUS
500.0000 mL | Freq: Once | INTRAVENOUS | Status: AC
  Administered 2023-10-26: 500 mL via INTRAVENOUS

## 2023-10-26 MED ORDER — ASPIRIN 81 MG PO CHEW
162.0000 mg | CHEWABLE_TABLET | Freq: Once | ORAL | Status: AC
  Administered 2023-10-26: 162 mg via ORAL
  Filled 2023-10-26: qty 2

## 2023-10-26 MED ORDER — POLYETHYLENE GLYCOL 3350 17 G PO PACK
17.0000 g | PACK | Freq: Once | ORAL | Status: AC
  Administered 2023-10-26: 17 g via ORAL
  Filled 2023-10-26: qty 1

## 2023-10-26 MED ORDER — HYDRALAZINE HCL 25 MG PO TABS
25.0000 mg | ORAL_TABLET | Freq: Once | ORAL | Status: AC
  Administered 2023-10-26: 25 mg via ORAL
  Filled 2023-10-26: qty 1

## 2023-10-26 MED ORDER — METOCLOPRAMIDE HCL 10 MG PO TABS
10.0000 mg | ORAL_TABLET | Freq: Once | ORAL | Status: AC
  Administered 2023-10-26: 10 mg via ORAL
  Filled 2023-10-26: qty 1

## 2023-10-26 MED ORDER — PANTOPRAZOLE SODIUM 40 MG PO TBEC
40.0000 mg | DELAYED_RELEASE_TABLET | Freq: Two times a day (BID) | ORAL | Status: DC
Start: 2023-10-27 — End: 2023-10-27
  Administered 2023-10-27 (×2): 40 mg via ORAL
  Filled 2023-10-26 (×2): qty 1

## 2023-10-26 MED ORDER — ASPIRIN 81 MG PO CHEW
324.0000 mg | CHEWABLE_TABLET | Freq: Once | ORAL | Status: DC
Start: 2023-10-26 — End: 2023-10-26

## 2023-10-26 MED ORDER — FLEET ENEMA RE ENEM
1.0000 | ENEMA | RECTAL | Status: AC
  Administered 2023-10-26: 1 via RECTAL

## 2023-10-26 MED ORDER — ONDANSETRON HCL 4 MG/2ML IJ SOLN
4.0000 mg | Freq: Once | INTRAMUSCULAR | Status: AC | PRN
  Administered 2023-10-26: 4 mg via INTRAVENOUS
  Filled 2023-10-26: qty 2

## 2023-10-26 MED ORDER — NITROGLYCERIN 0.4 MG SL SUBL
0.4000 mg | SUBLINGUAL_TABLET | Freq: Once | SUBLINGUAL | Status: AC
  Administered 2023-10-26: 0.4 mg via SUBLINGUAL
  Filled 2023-10-26: qty 1

## 2023-10-26 MED ORDER — TECHNETIUM TO 99M ALBUMIN AGGREGATED
4.4000 | Freq: Once | INTRAVENOUS | Status: AC | PRN
  Administered 2023-10-26: 4.4 via INTRAVENOUS

## 2023-10-26 MED ORDER — ONDANSETRON HCL 4 MG/2ML IJ SOLN
4.0000 mg | Freq: Once | INTRAMUSCULAR | Status: AC
  Administered 2023-10-26: 4 mg via INTRAVENOUS
  Filled 2023-10-26: qty 2

## 2023-10-26 MED ORDER — SIMETHICONE 80 MG PO CHEW
80.0000 mg | CHEWABLE_TABLET | Freq: Once | ORAL | Status: AC
  Administered 2023-10-26: 80 mg via ORAL
  Filled 2023-10-26 (×2): qty 1

## 2023-10-26 MED ORDER — DIPHENHYDRAMINE HCL 50 MG/ML IJ SOLN
12.5000 mg | Freq: Once | INTRAMUSCULAR | Status: AC
  Administered 2023-10-26: 12.5 mg via INTRAVENOUS
  Filled 2023-10-26: qty 1

## 2023-10-26 NOTE — ED Provider Notes (Signed)
 [place] EMERGENCY DEPARTMENT AT [facility] Provider Note   CSN: [id number] Arrival date & time: 10/26/23  2595     History {Add pertinent medical, surgical, social history, OB history to HPI:1} Chief Complaint  Patient presents with   Chest Pain   Abdominal Pain   Emesis   Constipation    [name] is a 73 y.o. male.  73 year old male with history of CHF, HTN, atrial fibrillation, mitral valve repair, and aortic valve repair who presents emergency department with abdominal pain and nausea and vomiting.  On 10/13/2023 patient had left total knee arthroplasty.  Says that since then he has been struggling with constipation.  Last bowel movement was 3 to 4 days ago.  Started having abdominal swelling and decreased flatus.  Stopped taking his hydrocodone on Friday because of the constipation.  Has had multiple episodes of nonbloody nonbilious emesis recently.  No abdominal surgeries.  Has tried ducolax, magnesium, and minienemas at home. Also says she has had some substernal chest tightness.  Not exertional or pleuritic.  No shortness of breath or cough.  Is taking Xarelto as well.       Home Medications Prior to Admission medications   Medication Sig Start Date End Date Taking? Authorizing Provider  acetaminophen (TYLENOL) 500 MG tablet Take 1 tablet (500 mg total) by mouth every 6 (six) hours as needed for mild pain (pain score 1-3) or moderate pain (pain score 4-6). 10/13/23   Gawne, Meghan M, PA-C  carvedilol (COREG) 6.25 MG tablet TAKE 1 TABLET (6.25 MG TOTAL) BY MOUTH 2 (TWO) TIMES DAILY WITH A MEAL. NEEDS FOLLOW UP APPOINTMENT 11/03/22   Darlis Eisenmenger, MD  colchicine 0.6 MG tablet Take 1 tablet (0.6 mg total) by mouth daily. 04/28/23   Austine Lefort, MD  dapagliflozin propanediol (FARXIGA) 10 MG TABS tablet Take 1 tablet (10 mg total) by mouth daily before breakfast. 02/04/23   Darlis Eisenmenger, MD  diclofenac Sodium (VOLTAREN) 1 % GEL Apply 2 g topically daily  as needed (pain).  12/14/19   [provider]  ENTRESTO 24-26 MG TAKE 1 TABLET BY MOUTH TWICE A DAY 10/20/23   Darlis Eisenmenger, MD  gabapentin (NEURONTIN) 300 MG capsule Take 300 mg by mouth daily.    [provider]  HYDROcodone-acetaminophen (NORCO) 10-325 MG tablet Take 1-2 tablets by mouth every 4 (four) hours as needed for severe pain (pain score 7-10). 10/13/23   Gawne, Meghan M, PA-C  KLOR-CON M20 20 MEQ tablet TAKE 1 TABLET BY MOUTH EVERY DAY 09/24/23   McLean, Dalton S, MD  MAGNESIUM PO Take 1 tablet by mouth daily.    [provider]  methocarbamol (ROBAXIN-750) 750 MG tablet Take 1 tablet (750 mg total) by mouth every 8 (eight) hours as needed for muscle spasms. MAY CAUSE DROWSINESS! 10/13/23   Lore Rode, PA-C  NON FORMULARY Pt uses a cpap nightly    [provider]  ondansetron (ZOFRAN-ODT) 4 MG disintegrating tablet Take 1 tablet (4 mg total) by mouth every 8 (eight) hours as needed for nausea or vomiting. 10/13/23   Gawne, Meghan M, PA-C  spironolactone (ALDACTONE) 25 MG tablet TAKE 1 TABLET BY MOUTH EVERY DAY 09/24/23   McLean, Dalton S, MD  tadalafil (CIALIS) 10 MG tablet Take 10 mg by mouth daily as needed for erectile dysfunction. 10/16/20   [provider]  tirzepatide (ZEPBOUND) 7.5 MG/0.5ML Pen Inject 7.5 mg into the skin once a week. 10/15/23  Pavero, Cristal Deer, RPH  torsemide (DEMADEX) 20 MG tablet Take 20 mg by mouth daily.    [provider]  XARELTO 20 MG TABS tablet TAKE 1 TABLET BY MOUTH DAILY WITH SUPPER 06/04/23   Laurey Morale, MD      Allergies    Metoprolol tartrate and Oxycodone    Review of Systems   Review of Systems  Physical Exam Updated Vital Signs BP (!) 176/75   Pulse 64   Temp 97.8 F (36.6 C) (Oral)   Resp 20   Ht 5\' 10"  (1.778 m)   Wt 115.2 kg   SpO2 100%   BMI 36.45 kg/m  Physical Exam Vitals and nursing note reviewed.  Constitutional:      General: He is not in acute distress.     Appearance: He is well-developed.  HENT:     Head: Normocephalic and atraumatic.     Right Ear: External ear normal.     Left Ear: External ear normal.     Nose: Nose normal.  Eyes:     Extraocular Movements: Extraocular movements intact.     Conjunctiva/sclera: Conjunctivae normal.     Pupils: Pupils are equal, round, and reactive to light.  Cardiovascular:     Rate and Rhythm: Normal rate and regular rhythm.  Pulmonary:     Effort: Pulmonary effort is normal. No respiratory distress.  Abdominal:     General: There is distension.     Palpations: Abdomen is soft. There is no mass.     Tenderness: There is no abdominal tenderness. There is no guarding.  Musculoskeletal:     Cervical back: Normal range of motion and neck supple.     Right lower leg: No edema.     Left lower leg: No edema.  Skin:    General: Skin is warm and dry.  Neurological:     Mental Status: He is alert. Mental status is at baseline.  Psychiatric:        Mood and Affect: Mood normal.        Behavior: Behavior normal.     ED Results / Procedures / Treatments   Labs (all labs ordered are listed, but only abnormal results are displayed) Labs Reviewed  BASIC METABOLIC PANEL WITH GFR  CBC  LIPASE, BLOOD  URINALYSIS, ROUTINE W REFLEX MICROSCOPIC  HEPATIC FUNCTION PANEL  TROPONIN I (HIGH SENSITIVITY)    EKG None  Radiology No results found.  Procedures Procedures  {Document cardiac monitor, telemetry assessment procedure when appropriate:1}  Medications Ordered in ED Medications  ondansetron (ZOFRAN) injection 4 mg (has no administration in time range)    ED Course/ Medical Decision Making/ A&P   {   Click here for ABCD2, HEART and other calculatorsREFRESH Note before signing :1}                              Medical Decision Making Amount and/or Complexity of Data Reviewed Labs: ordered. Radiology: ordered.  Risk Prescription drug management.   ***  {Document critical care time  when appropriate:1} {Document review of labs and clinical decision tools ie heart score, Chads2Vasc2 etc:1}  {Document your independent review of radiology images, and any outside records:1} {Document your discussion with family members, caretakers, and with consultants:1} {Document social determinants of health affecting pt's care:1} {Document your decision making why or why not admission, treatments were needed:1} Final Clinical Impression(s) / ED Diagnoses Final diagnoses:  None  Rx / DC Orders ED Discharge Orders     None

## 2023-10-26 NOTE — ED Notes (Signed)
 Pt reports taking Zepbound x 2 months. Denies nausea side effects with medication.

## 2023-10-26 NOTE — Consult Note (Signed)
 CARDIOLOGY CONSULT NOTE    Patient ID: [name]; [id number]; 1950/09/30   Admit date: 10/26/2023 Date of Consult: 10/26/2023  Primary Care Provider: Donita Brooks, MD Primary Cardiologist:  Primary Electrophysiologist:     History of Present Illness:   Mr. [name] is a 73 y/o F known to have HFimpEF, permanent Afib/flutter s/p unsuccessful Maze procedure in 2019, s/p AVN ablation s/p CRT-D, s/p LAA ligation, Hx of aortic and mitral valve repairs, OSA on CPAP presented to the ER with abdominal pain, nausea and vomiting x couple of days.  He underwent left knee surgery 2 weeks ago after which he was started on pain medications. He stopped taking pain medications around 4 days ago as he did not have a BM yet.  Last BM was more than 4 days ago.  He reported that he was profusely vomiting all day yesterday every 30 minutes but nothing was coming out except a yellow liquid.  He was not able to keep anything down.  He was able to drink water though. No solid food.  He reported having some chest tightness and abdominal discomfort intermittently with vomiting.  He received enema after he arrived to the ER.  He reported significant improvement in his abdominal discomfort after he received enema although he did not have any significant BM with enema.  No chest tightness. Hs troponins mildly elevated, 32>> 41.  He underwent LHC in 2019 that showed nonobstructive CAD.  Past Medical History:  Diagnosis Date   AICD (automatic cardioverter/defibrillator) present    Aortic insufficiency 04/21/2018   Aortic insufficiency    Arthritis    "joints" (09/26/2013)   Atrial enlargement, left    CHF (congestive heart failure) (HCC)    Dysrhythmia    A. fib   Essential hypertension 11/28/2020   Hearing aid worn    both ears   Mitral regurgitation 04/21/2018   Neuromuscular disorder (HCC)    Neuropathy in both feet   Obesity    OSA on CPAP    "mask adjusts to what I need" (09/26/2013)   Persistent  atrial fibrillation (HCC)    cardioversion 06/06/13   PONV (postoperative nausea and vomiting)    Rotator cuff tear, right 06/2011   physical therapy done, decreased strength   S/P aortic valve repair 05/18/2018   Complex valvuloplasty including plication of right coronary leaflet and 21 mm Biostable HAART annuloplasty ring   S/P Maze operation for atrial fibrillation 05/18/2018   Complete bilateral atrial lesion set using bipolar radiofrequency and cryothermy ablation with clipping of LA appendage   S/P mitral valve repair 05/18/2018   Complex valvuloplasty including artificial Gore-tex neochord placement x4 and 30 mm Sorin Memo 4D ring annuloplasty   Wears glasses     Past Surgical History:  Procedure Laterality Date   AORTIC VALVE REPAIR N/A 05/18/2018   Procedure: AORTIC VALVE REPAIR using HAART 300 Aortic Annuloplasty Device size 21mm;  Surgeon: Purcell Nails, MD;  Location: [facility] OR;  Service: Open Heart Surgery;  Laterality: N/A;   ATRIAL FIBRILLATION ABLATION N/A 04/26/2019   Procedure: ATRIAL FIBRILLATION ABLATION;  Surgeon: Hillis Range, MD;  Location: [facility] INVASIVE CV LAB;  Service: Cardiovascular;  Laterality: N/A;   AV NODE ABLATION N/A 12/20/2019   Procedure: AV NODE ABLATION;  Surgeon: Hillis Range, MD;  Location: [facility];  Service: Cardiovascular;  Laterality: N/A;   BIV ICD INSERTION CRT-D N/A 08/23/2019   Procedure: BIV ICD INSERTION CRT-D;  Surgeon: Hillis Range, MD;  Location: [facility];  Service: Cardiovascular;  Laterality: N/A;   CARDIOVERSION N/A 06/06/2013   Procedure: CARDIOVERSION;  Surgeon: Thurmon Fair, MD;  Location: [facility] ENDOSCOPY;  Service: Cardiovascular;  Laterality: N/A;   CARDIOVERSION N/A 09/28/2013   Procedure: CARDIOVERSION BEDSIDE;  Surgeon: Peter M Swaziland, MD;  Location: [facility] OR;  Service: Cardiovascular;  Laterality: N/A;   CARDIOVERSION N/A 11/30/2018   Procedure: CARDIOVERSION;  Surgeon: Lars Masson, MD;  Location: [facility]  ENDOSCOPY;  Service: Cardiovascular;  Laterality: N/A;   CARDIOVERSION N/A 03/10/2019   Procedure: CARDIOVERSION;  Surgeon: Little Ishikawa, MD;  Location: [facility] ENDOSCOPY;  Service: Endoscopy;  Laterality: N/A;   CARDIOVERSION N/A 12/20/2021   Procedure: CARDIOVERSION;  Surgeon: Laurey Morale, MD;  Location: [facility] ENDOSCOPY;  Service: Cardiovascular;  Laterality: N/A;   CARPAL TUNNEL RELEASE Right 08/08/2013   Procedure: RIGHT CARPAL TUNNEL RELEASE;  Surgeon: Nicki Reaper, MD;  Location: [facility];  Service: Orthopedics;  Laterality: Right;   CARPAL TUNNEL RELEASE Left 07/14/2006   CLIPPING OF ATRIAL APPENDAGE  05/18/2018   Procedure: CLIPPING OF ATRIAL APPENDAGE using AtriCure Clip size 45;  Surgeon: Purcell Nails, MD;  Location: [facility] OR;  Service: Open Heart Surgery;;   KNEE ARTHROSCOPY Right 03/14/1989   MAZE N/A 05/18/2018   Procedure: MAZE;  Surgeon: Purcell Nails, MD;  Location: [facility] OR;  Service: Open Heart Surgery;  Laterality: N/A;   MITRAL VALVE REPAIR N/A 05/18/2018   Procedure: MITRAL VALVE REPAIR (MVR) using 4D Memo Ring size 30;  Surgeon: Purcell Nails, MD;  Location: [facility] OR;  Service: Open Heart Surgery;  Laterality: N/A;   PACEMAKER INSERTION     RIGHT/LEFT HEART CATH AND CORONARY ANGIOGRAPHY N/A 04/21/2018   Procedure: RIGHT/LEFT HEART CATH AND CORONARY ANGIOGRAPHY;  Surgeon: Yvonne Kendall, MD;  Location: [facility]VAS[facility] CV LAB;  Service: Cardiovascular;  Laterality: N/A;   SHOULDER OPEN ROTATOR CUFF REPAIR Left 03/14/1989   SHOULDER OPEN ROTATOR CUFF REPAIR Right 11/12/2007   TEE WITHOUT CARDIOVERSION N/A 04/21/2018   Procedure: TRANSESOPHAGEAL ECHOCARDIOGRAM (TEE);  Surgeon: Jake Bathe, MD;  Location: [facility] ENDOSCOPY;  Service: Cardiovascular;  Laterality: N/A;   TEE WITHOUT CARDIOVERSION N/A 05/18/2018   Procedure: TRANSESOPHAGEAL ECHOCARDIOGRAM (TEE);  Surgeon: Purcell Nails, MD;  Location: [facility] OR;  Service: Open Heart Surgery;  Laterality: N/A;    TONSILLECTOMY  1950's   TOTAL HIP ARTHROPLASTY Left 07/14/2008   TOTAL HIP ARTHROPLASTY Right 05/29/2020   Procedure: TOTAL HIP ARTHROPLASTY ANTERIOR APPROACH;  Surgeon: Sheral Apley, MD;  Location: [facility] ORS;  Service: Orthopedics;  Laterality: Right;   TOTAL HIP REVISION Left 10/08/2011   TOTAL HIP REVISION  04/09/2012   Procedure: TOTAL HIP REVISION;  Surgeon: Loanne Drilling, MD;  Location: [facility] ORS;  Service: Orthopedics;  Laterality: Left;  Left Hip Acetabular Revision vs Constrained Liner   TOTAL KNEE ARTHROPLASTY Right 07/14/2004   TOTAL KNEE ARTHROPLASTY Left 10/13/2023   Procedure: ARTHROPLASTY, KNEE, TOTAL;  Surgeon: Sheral Apley, MD;  Location: [facility] ORS;  Service: Orthopedics;  Laterality: Left;   TRIGGER FINGER RELEASE Right 08/08/2013   Procedure: RELEASE STENOSING TENOSYNOVITIS RIGHT THUMB;  Surgeon: Nicki Reaper, MD;  Location: [facility];  Service: Orthopedics;  Laterality: Right;     Inpatient Medications: Scheduled Meds:  Continuous Infusions:  PRN Meds:   Allergies:    Allergies  Allergen Reactions   Metoprolol Tartrate Shortness Of Breath   Oxycodone Rash and Other (See Comments)  Can tolerate Hydrocodone    Social History:   Social History   Socioeconomic History   Marital status: Married    Spouse name: Not on file   Number of children: Not on file   Years of education: Not on file   Highest education level: Not on file  Occupational History   Occupation: Public affairs consultant  Tobacco Use   Smoking status: Never    Passive exposure: Never   Smokeless tobacco: Never  Vaping Use   Vaping status: Never Used  Substance and Sexual Activity   Alcohol use: No   Drug use: No   Sexual activity: Yes  Other Topics Concern   Not on file  Social History Narrative   Pt lives in [place] with spouse.  Leadership training and behavioral safety training.   Social Drivers of Health   Financial Resource Strain:  Medium Risk (07/23/2023)   Overall Financial Resource Strain (CARDIA)    Difficulty of Paying Living Expenses: Somewhat hard  Food Insecurity: Patient Declined (07/23/2023)   Hunger Vital Sign    Worried About Running Out of Food in the Last Year: Patient declined    Ran Out of Food in the Last Year: Patient declined  Transportation Needs: Patient Declined (07/23/2023)   PRAPARE - Administrator, Civil Service (Medical): Patient declined    Lack of Transportation (Non-Medical): Patient declined  Physical Activity: Unknown (07/23/2023)   Exercise Vital Sign    Days of Exercise per Week: 0 days    Minutes of Exercise per Session: Not on file  Stress: Stress Concern Present (07/23/2023)   [employer] of Occupational Health - Occupational Stress Questionnaire    Feeling of Stress : To some extent  Social Connections: Unknown (07/23/2023)   Social Connection and Isolation Panel [NHANES]    Frequency of Communication with Friends and Family: Patient declined    Frequency of Social Gatherings with Friends and Family: Patient declined    Attends Religious Services: Patient declined    Database administrator or Organizations: Yes    Attends Banker Meetings: Patient declined    Marital Status: Patient declined  Intimate Partner Violence: Unknown (10/16/2021)   Received from [employer], [facility]   HITS    Physically Hurt: Not on file    Insult or Talk Down To: Not on file    Threaten Physical Harm: Not on file    Scream or Curse: Not on file    Family History:    Family History  Problem Relation Age of Onset   Heart Problems Mother    Arthritis Mother    Cancer Sister    Other Sister        bilateral hip replacements   Healthy Daughter    Healthy Son    Healthy Son    Other Other        mother had a pacemaker     ROS:  Please see the history of present illness.  ROS  All other ROS reviewed and negative.     Physical Exam/Data:   Vitals:    10/26/23 1115 10/26/23 1200 10/26/23 1245 10/26/23 1330  BP: (!) 173/76 (!) 177/87 (!) 165/78 (!) 187/93  Pulse: 64 65 64 64  Resp: 17 (!) 23 17 15   Temp:      TempSrc:      SpO2: 94% 95% 99% 98%  Weight:      Height:        Intake/Output  Summary (Last 24 hours) at 10/26/2023 1435 Last data filed at 10/26/2023 1418 Gross per 24 hour  Intake 1000 ml  Output 125 ml  Net 875 ml   Filed Weights   10/26/23 0857  Weight: 115.2 kg   Body mass index is 36.45 kg/m.  General:  Well nourished, well developed, in no acute distress HEENT: normal Lymph: no adenopathy Neck: no JVD Endocrine:  No thryomegaly Vascular: No carotid bruits; FA pulses 2+ bilaterally without bruits  Cardiac:  normal S1, S2; RRR; no murmur  Lungs:  clear to auscultation bilaterally, no wheezing, rhonchi or rales  Abd: soft, nontender, no hepatomegaly  Ext: no edema Musculoskeletal:  No deformities, BUE and BLE strength normal and equal Skin: warm and dry  Neuro:  CNs 2-12 intact, no focal abnormalities noted Psych:  Normal affect   EKG:  The EKG was personally reviewed and demonstrates: V paced rhythm, with underlying atrial fibrillation rhythm. Telemetry:  Telemetry was personally reviewed and demonstrates: HR controlled.  Relevant CV Studies:   Laboratory Data:  Chemistry Recent Labs  Lab 10/26/23 0910  NA 136  K 3.5  CL 95*  CO2 24  GLUCOSE 149*  BUN 29*  CREATININE 1.48*  CALCIUM 10.7*  GFRNONAA 51*  ANIONGAP 17*    Recent Labs  Lab 10/26/23 0910  PROT 8.2*  ALBUMIN 4.8  AST 34  ALT 36  ALKPHOS 63  BILITOT 1.7*   Hematology Recent Labs  Lab 10/26/23 0910  WBC 11.3*  RBC 5.31  HGB 15.0  HCT 45.2  MCV 85.1  MCH 28.2  MCHC 33.2  RDW 14.1  PLT 301   Cardiac EnzymesNo results for input(s): "TROPONINI" in the last 168 hours. No results for input(s): "TROPIPOC" in the last 168 hours.  BNP Recent Labs  Lab 10/26/23 0910  BNP 461.0*    DDimer  Recent Labs  Lab  10/26/23 0910  DDIMER 1.69*    Radiology/Studies:  CT ABDOMEN PELVIS W CONTRAST Result Date: 10/26/2023 CLINICAL DATA:  Abdominal pain and swelling, nausea and vomiting, constipation. Recent left knee surgery. EXAM: CT ABDOMEN AND PELVIS WITH CONTRAST TECHNIQUE: Multidetector CT imaging of the abdomen and pelvis was performed using the standard protocol following bolus administration of intravenous contrast. RADIATION DOSE REDUCTION: This exam was performed according to the departmental dose-optimization program which includes automated exposure control, adjustment of the mA and/or kV according to patient size and/or use of iterative reconstruction technique. CONTRAST:  OMNIPAQUE IOHEXOL 300 MG/ML  SOLN COMPARISON:  06/17/2020.  Chest radiographs obtained earlier today. FINDINGS: Lower chest: Enlarged heart with an interval increase in size. AICD and epicardial pacemaker leads. Minimal left basilar atelectasis/scarring. Stable right diaphragmatic eventration. Hepatobiliary: No focal liver abnormality is seen. No gallstones, gallbladder wall thickening, or biliary dilatation. Pancreas: Unremarkable. No pancreatic ductal dilatation or surrounding inflammatory changes. Spleen: Small calcified granuloma.  Otherwise, normal. Adrenals/Urinary Tract: Multiple small bilateral simple appearing renal cysts. These do not need imaging follow-up. Stable small area of cortical scarring with focal calcification involving the left kidney. Unremarkable ureters and urinary bladder. Portions of the bladder obscured by streak artifacts produced by bilateral hip prostheses. Stomach/Bowel: Stomach is within normal limits. Appendix appears normal. No evidence of bowel wall thickening, distention, or inflammatory changes. Vascular/Lymphatic: No significant vascular findings are present. No enlarged abdominal or pelvic lymph nodes. Reproductive: Mildly enlarged prostate gland, partially obscured by streak artifacts from  bilateral hip prostheses. Other: Small to moderate-sized umbilical hernia containing fat. Musculoskeletal: Moderate lumbar and lower  thoracic spine degenerative changes. Bilateral hip prostheses with associated streak artifacts. IMPRESSION: 1. No acute abnormality. 2. Enlarged heart with an interval increase in size. 3. Mildly enlarged prostate gland. 4. Small to moderate-sized umbilical hernia containing fat. Electronically Signed   By: Catherin Closs M.D.   On: 10/26/2023 11:14   DG Chest 2 View Result Date: 10/26/2023 CLINICAL DATA:  Mid chest pain, nausea, vomiting and abdominal pain since yesterday. Status post left knee surgery on 10/13/2023. Stopped pain medication 3 days ago. Last bowel movement 4 days ago. EXAM: CHEST - 2 VIEW COMPARISON:  08/23/2019 FINDINGS: Stable enlarged cardiac silhouette, mediastinal wires, left atrial clip and left subclavian pacer and AICD leads. Clear lungs with normal vascularity. Thoracic and upper lumbar spine degenerative changes. IMPRESSION: 1. No acute abnormality. 2. Stable cardiomegaly. Electronically Signed   By: Catherin Closs M.D.   On: 10/26/2023 10:25    Assessment and Plan:   Abdominal pain, nausea and vomiting: Ongoing for the last couple of days. Last BM was more than 4 days ago.  Abdominal discomfort significantly improved with enema administration.  CT imaging of abdomen did not reveal any GI pathology.  Medical management of constipation per primary team.  He did report chest tightness along with abdominal discomfort but this is likely in setting of profuse vomiting every 30 minutes all day yesterday.  Although high-sensitivity troponins are mildly elevated, 32>> 41, likely demand ischemia from hypertensive urgency.  LHC in 2019 showed nonobstructive CAD.  Abdominal pain, nausea and vomiting, rule out PE: D-dimer is elevated.  He underwent left knee surgery around 2 weeks ago.  Will obtain VQ scan.  He is currently on Xarelto for permanent A-fib despite  undergoing LAA ligation.  But he is taking Xarelto in the morning before meal, suspect subtherapeutic effect as a result.  Chronic diastolic heart failure: BNP is elevated likely in setting of hypertensive urgency.  He does not have any symptoms of SOB.  He does have elevation of total bilirubin, 1.7, elevation of serum creatinine 1.48 but serum chloride is low, 95 likely secondary to profuse vomiting.  He received 1L NS bolus in the ER.  Will monitor for now.  Permanent A-fib/flutter s/p unsuccessful maze procedure in 2019, AV nodal ablation s/p CRT-D: EKG today showed atrial fibrillation with V paced rhythm. Underwent LAA ligation at the time of maze procedure.  Currently on Xarelto, per advanced heart failure team.  But he is taking Xarelto in the a.m. before any meal, suspect subtherapeutic effect as a result.  History of aortic and mitral valve repairs: Echo in 2025 showed normal aortic and mitral valve gradients.  Nonobstructive CAD in 2019: He presented with chest tightness and abdomen discomfort but this chest tightness is likely secondary to dry heaving/vomiting profusely.  OSA on CPAP: Continue CPAP.  HTN, poorly controlled: BP 187/93 upon arrival to the ER.  Resume home medications.   For questions or updates, please contact [facility] Please consult [email or web address] for contact info under Cardiology/STEMI.   Signed, Hien Cunliffe Priya Nayeli Calvert, MD 10/26/2023 2:35 PM

## 2023-10-26 NOTE — Progress Notes (Signed)
   10/26/23 2155  TOC Brief Assessment  Insurance and Status Reviewed  Patient has primary care physician Yes  Home environment has been reviewed From home c/wife.  Prior level of function: Independent.  Prior/Current Home Services No current home services  Social Drivers of Health Review SDOH reviewed no interventions necessary  Readmission risk has been reviewed Yes  Transition of care needs no transition of care needs at this time   Transition of Care Department [facility]) has reviewed patient and no other TOC needs have been identified at this time. We will continue to monitor patient advancement through interdisciplinary progression rounds. If new patient needs arise, please place a TOC consult.

## 2023-10-26 NOTE — Telephone Encounter (Signed)
 Copied from CRM [id number]. Topic: Clinical - Red Word Triage >> Oct 26, 2023  7:49 AM Carlatta H wrote: [facility] that prompted transfer to Nurse Triage: patient has been vomiting since last night about every 30 to 40 minutes//Patient has been constipated for about 3 days//  Chief Complaint: vomiting; hx recent knee surgery Symptoms: vomiting every 20-30 minutes since 8 pm last night Frequency: every 20-30 min Pertinent Negatives: Patient denies fever, dehydration signs Disposition: [x] ED /[] Urgent Care (no appt availability in office) / [] Appointment(In office/virtual)/ []  [place] [facility]/ [] Home Care/ [] Refused Recommended Disposition /[] [place] [facility]/ []  Follow-up with PCP Additional Notes: instructed to go to ER.  Pcp office updated; wife states she gave him multiple enemas, laxatives and stool softeners without results.   Reason for Disposition  [1] SEVERE vomiting (e.g., 6 or more times/day) AND [2] present > 8 hours (Exception: Patient sounds well, is drinking liquids, does not sound dehydrated, and vomiting has lasted less than 24 hours.)  Answer Assessment - Initial Assessment Questions 1. VOMITING SEVERITY: "How many times have you vomited in the past 24 hours?"     - MILD:  1 - 2 times/day    - MODERATE: 3 - 5 times/day, decreased oral intake without significant weight loss or symptoms of dehydration    - SEVERE: 6 or more times/day, vomits everything or nearly everything, with significant weight loss, symptoms of dehydration      severe 2. ONSET: "When did the vomiting begin?"      Since last night 3. FLUIDS: "What fluids or food have you vomited up today?" "Have you been able to keep any fluids down?"     States bile 4. ABDOMEN PAIN: "Are your having any abdomen pain?" If Yes : "How bad is it and what does it feel like?" (e.g., crampy, dull, intermittent, constant)      crampy 5. DIARRHEA: "Is there any diarrhea?" If Yes, ask: "How many times today?"       Feeling constipated 6. CONTACTS: "Is there anyone else in the family with the same symptoms?"      no 7. CAUSE: "What do you think is causing your vomiting?"     Maybe "constipation" 8. HYDRATION STATUS: "Any signs of dehydration?" (e.g., dry mouth [not only dry lips], too weak to stand) "When did you last urinate?"     no 9. OTHER SYMPTOMS: "Do you have any other symptoms?" (e.g., fever, headache, vertigo, vomiting blood or coffee grounds, recent head injury)     denies 10. PREGNANCY: "Is there any chance you are pregnant?" "When was your last menstrual period?"       na  Protocols used: Vomiting-A-AH

## 2023-10-26 NOTE — ED Notes (Signed)
 Pt with OSA found to have O2 sats in the low 80s. Supplemental O2 given at 2L and HOB raised.

## 2023-10-26 NOTE — Care Management Obs Status (Signed)
 MEDICARE OBSERVATION STATUS NOTIFICATION   Patient Details  Name: [name] MRN: [id number] Date of Birth: 10-06-1950   Medicare Observation Status Notification Given:  Yes    Geraldina Klinefelter, RN 10/26/2023, 9:31 PM

## 2023-10-26 NOTE — ED Provider Notes (Addendum)
 3:56 PM Patient signed out to me by previous ED physician. Pt is a 73 yo male with mitral valve replacement and afib presenting for epigastric abdominal pain, nausea, and vomiting post knee replacement surgery.   Trop 32-41. Ecg demonstrates ventricular paced rhythm. Cards, Dr. Mallipeddi, came to see pt.. thinks it's possibly secondary to hypertension.. 3rd trop pending.  D-dimer elevated. VQ pending.   Physical Exam  BP (!) 187/93   Pulse 64   Temp 97.8 F (36.6 C) (Oral)   Resp 15   Ht 5\' 10"  (1.778 m)   Wt 115.2 kg   SpO2 98%   BMI 36.45 kg/m   Physical Exam  Procedures  Procedures  ED Course / MDM   Clinical Course as of 10/26/23 1937  Mon Oct 26, 2023  1026 Creatinine(!): 1.48 1.35 baseline [RP]  1317 Dr Germaine Kohut from cardiology to evaluate the patient.  [RP]  1616 Troponin I (High Sensitivity)(!): 66 [AG]    Clinical Course User Index [AG] Quinn Bucco, DO [RP] Ninetta Basket, MD   Medical Decision Making Amount and/or Complexity of Data Reviewed Labs: ordered. Decision-making details documented in ED Course. Radiology: ordered.  Risk OTC drugs. Prescription drug management. Decision regarding hospitalization.   Troponins continued to rise minimally 32> 41> 66> 68.  On cardiology's original consult they thought this might be's possibly secondary to hypertensive emergency with troponin leak from demand ischemia.  Will defer to their expertise for hypertensive emergency versus NSTEMI.  Aspirin given on arrival.  Patient also received hydralazine.  Patient received a VQ scan to rule out a pulmonary embolism in the setting of knee replacement surgery 2 weeks ago and a current AKI.  VQ scan demonstrates very low probability of pulmonary embolism.  Patient is actively belching on exam, admits to epigastric discomfort, and indigestion.  Also did come in today with complaints of constipation secondary to narcotic use from recent knee replacement.  His CT scan did  not show a high fecal burden.  It also did not show a bowel obstruction.  He was given an enema with a small amount of bowel movement produced.  I ordered him methadone and Pepcid for indigestion in case this is more GI related.  I spoke with our admitting team who agrees to accept patient.  Patient requesting different bed at this time due to discomfort in the emergency department bed.  I let our charge nurse know.  Patient is requesting to leave AGAINST MEDICAL ADVICE if he is unable to get a more comfortable bed.      Quinn Bucco, DO 10/26/23 1940

## 2023-10-26 NOTE — ED Notes (Signed)
 Put bedside commode at bedside for pt

## 2023-10-26 NOTE — ED Notes (Signed)
 Bolus complete. Patient wife is requesting more fluids. BP 173 systolic. Pt is requesting something for anxiety and c/o muscle twitching in left leg. MD notified.

## 2023-10-26 NOTE — Progress Notes (Signed)
   10/26/23 2141  BiPAP/CPAP/SIPAP  $ Non-Invasive Ventilator  Non-Invasive Vent Set Up  BiPAP/CPAP/SIPAP Pt Type Adult  BiPAP/CPAP/SIPAP DREAMSTATIOND  Mask Type  (patient's home mask)  Dentures removed? Not applicable  Respiratory Rate 17 breaths/min  EPAP  (auto titration between 6 and 20)  FiO2 (%) 21 %  Patient Home Machine No  Patient Home Mask Yes  Patient Home Tubing No  Auto Titrate Yes  Device Plugged into RED Power Outlet Yes  BiPAP/CPAP /SiPAP Vitals  Pulse Rate 67  Resp 17  SpO2 97 %  Bilateral Breath Sounds Clear;Diminished  MEWS Score/Color  MEWS Score 0  MEWS Score Color Green

## 2023-10-26 NOTE — ED Triage Notes (Signed)
 Pt arrived POV with c/o mid chest pain. N/V, and abd pain since yesterday. Pt had surgery on April 1st, on his left knee and has been constipated. Pt stopped pain meds Friday. Last BM 4 days ago.

## 2023-10-27 ENCOUNTER — Other Ambulatory Visit (HOSPITAL_COMMUNITY)

## 2023-10-27 DIAGNOSIS — Z9889 Other specified postprocedural states: Secondary | ICD-10-CM

## 2023-10-27 DIAGNOSIS — I10 Essential (primary) hypertension: Secondary | ICD-10-CM | POA: Diagnosis not present

## 2023-10-27 DIAGNOSIS — T887XXA Unspecified adverse effect of drug or medicament, initial encounter: Secondary | ICD-10-CM

## 2023-10-27 DIAGNOSIS — R1084 Generalized abdominal pain: Secondary | ICD-10-CM | POA: Diagnosis not present

## 2023-10-27 DIAGNOSIS — Z8679 Personal history of other diseases of the circulatory system: Secondary | ICD-10-CM | POA: Diagnosis not present

## 2023-10-27 DIAGNOSIS — N179 Acute kidney failure, unspecified: Secondary | ICD-10-CM

## 2023-10-27 DIAGNOSIS — R1013 Epigastric pain: Secondary | ICD-10-CM | POA: Diagnosis not present

## 2023-10-27 DIAGNOSIS — I5032 Chronic diastolic (congestive) heart failure: Secondary | ICD-10-CM | POA: Diagnosis not present

## 2023-10-27 DIAGNOSIS — I4821 Permanent atrial fibrillation: Secondary | ICD-10-CM

## 2023-10-27 DIAGNOSIS — R0789 Other chest pain: Secondary | ICD-10-CM

## 2023-10-27 LAB — BASIC METABOLIC PANEL WITH GFR
Anion gap: 13 (ref 5–15)
BUN: 27 mg/dL — ABNORMAL HIGH (ref 8–23)
CO2: 25 mmol/L (ref 22–32)
Calcium: 9.5 mg/dL (ref 8.9–10.3)
Chloride: 97 mmol/L — ABNORMAL LOW (ref 98–111)
Creatinine, Ser: 1.44 mg/dL — ABNORMAL HIGH (ref 0.61–1.24)
GFR, Estimated: 52 mL/min — ABNORMAL LOW (ref >60)
Glucose, Bld: 126 mg/dL — ABNORMAL HIGH (ref 70–99)
Potassium: 3.3 mmol/L — ABNORMAL LOW (ref 3.5–5.1)
Sodium: 135 mmol/L (ref 135–145)

## 2023-10-27 MED ORDER — TORSEMIDE 20 MG PO TABS
20.0000 mg | ORAL_TABLET | Freq: Every day | ORAL | Status: DC
  Administered 2023-10-27: 20 mg via ORAL
  Filled 2023-10-27: qty 1

## 2023-10-27 MED ORDER — SIMETHICONE 80 MG PO CHEW
80.0000 mg | CHEWABLE_TABLET | Freq: Four times a day (QID) | ORAL | Status: DC
  Administered 2023-10-27 (×2): 80 mg via ORAL
  Filled 2023-10-27 (×2): qty 1

## 2023-10-27 MED ORDER — ACETAMINOPHEN 325 MG PO TABS: 650.0000 mg | ORAL_TABLET | Freq: Four times a day (QID) | ORAL | Status: DC | PRN

## 2023-10-27 MED ORDER — ONDANSETRON HCL 4 MG PO TABS: 4.0000 mg | ORAL_TABLET | Freq: Four times a day (QID) | ORAL | Status: DC | PRN

## 2023-10-27 MED ORDER — ONDANSETRON HCL 4 MG/2ML IJ SOLN
4.0000 mg | Freq: Four times a day (QID) | INTRAMUSCULAR | Status: DC | PRN
  Administered 2023-10-27: 4 mg via INTRAVENOUS
  Filled 2023-10-27: qty 2

## 2023-10-27 MED ORDER — ORAL CARE MOUTH RINSE: 15.0000 mL | OROMUCOSAL | Status: DC | PRN

## 2023-10-27 MED ORDER — METHOCARBAMOL 500 MG PO TABS
750.0000 mg | ORAL_TABLET | Freq: Three times a day (TID) | ORAL | Status: DC | PRN
  Administered 2023-10-27: 750 mg via ORAL
  Filled 2023-10-27: qty 2

## 2023-10-27 MED ORDER — SPIRONOLACTONE 25 MG PO TABS
25.0000 mg | ORAL_TABLET | Freq: Every day | ORAL | Status: DC
  Administered 2023-10-27: 25 mg via ORAL
  Filled 2023-10-27: qty 1

## 2023-10-27 MED ORDER — COLCHICINE 0.6 MG PO TABS
0.6000 mg | ORAL_TABLET | Freq: Every day | ORAL | Status: DC
  Administered 2023-10-27: 0.6 mg via ORAL
  Filled 2023-10-27: qty 1

## 2023-10-27 MED ORDER — RIVAROXABAN 20 MG PO TABS: 20.0000 mg | ORAL_TABLET | Freq: Every day | ORAL | Status: DC

## 2023-10-27 MED ORDER — SIMETHICONE 80 MG PO CHEW: 80.0000 mg | CHEWABLE_TABLET | Freq: Four times a day (QID) | ORAL | 0 refills | Status: DC | PRN

## 2023-10-27 MED ORDER — ALUM & MAG HYDROXIDE-SIMETH 200-200-20 MG/5ML PO SUSP
15.0000 mL | ORAL | Status: DC | PRN
  Filled 2023-10-27: qty 30

## 2023-10-27 MED ORDER — CARVEDILOL 12.5 MG PO TABS
6.2500 mg | ORAL_TABLET | Freq: Two times a day (BID) | ORAL | Status: DC
  Administered 2023-10-27: 6.25 mg via ORAL
  Filled 2023-10-27: qty 1

## 2023-10-27 MED ORDER — FAMOTIDINE IN NACL 20-0.9 MG/50ML-% IV SOLN
20.0000 mg | Freq: Once | INTRAVENOUS | Status: AC
  Administered 2023-10-27: 20 mg via INTRAVENOUS
  Filled 2023-10-27: qty 50

## 2023-10-27 MED ORDER — POTASSIUM CHLORIDE 20 MEQ PO PACK
40.0000 meq | PACK | Freq: Once | ORAL | Status: AC
Start: 2023-10-27 — End: 2023-10-27
  Administered 2023-10-27: 40 meq via ORAL
  Filled 2023-10-27: qty 2

## 2023-10-27 MED ORDER — SACUBITRIL-VALSARTAN 24-26 MG PO TABS
1.0000 | ORAL_TABLET | Freq: Two times a day (BID) | ORAL | Status: DC
  Administered 2023-10-27: 1 via ORAL
  Filled 2023-10-27: qty 1

## 2023-10-27 MED ORDER — ACETAMINOPHEN 650 MG RE SUPP: 650.0000 mg | Freq: Four times a day (QID) | RECTAL | Status: DC | PRN

## 2023-10-27 MED ORDER — GABAPENTIN 300 MG PO CAPS
300.0000 mg | ORAL_CAPSULE | Freq: Every day | ORAL | Status: DC
  Administered 2023-10-27: 300 mg via ORAL
  Filled 2023-10-27: qty 1

## 2023-10-27 MED ORDER — FAMOTIDINE 20 MG PO TABS: 20.0000 mg | ORAL_TABLET | Freq: Every day | ORAL | 0 refills | Status: AC | PRN

## 2023-10-27 NOTE — Progress Notes (Signed)
 [facility]  PROGRESS NOTE  HARLAN ERVINE HQI:[id number] DOB: 1951/01/19 DOA: 10/26/2023 PCP: Donita Brooks, MD   Brief HPI:   73 y.o. male with medical history significant of mitral valve replacement, paroxysmal A-fib who presented with epigastric pain nausea vomiting.  Patient recent knee replacement and subsequently developed nausea and vomiting.  He has been unable to tolerate much food.  He presented to the ER where he was found to be afebrile and hemodynamically stable     Assessment/Plan:    Epigastric abdominal pain/nausea/vomiting - Patient's symptoms improved with Pepcid - Etiology of patient's symptoms likely related to gastritis or GERD -Continue Pepcid, simethicone      # Hypertensive urgency # Elevated troponin - Patient found to have hypertension in setting of elevated troponins -Blood pressure has improved - Will continue Entresto, carvedilol, torsemide -Cardiology consulted, plan for echocardiogram today   Acute kidney injury -Creatinine 1.48, likely in setting of diuresis -Check BUN/creatinine today  Constipation- as needed bowel regimen   #Hx of CHF, preserved EF, not in exacerbation- Bnp elevated. Will continue home meds.  Echocardiogram today.   #Recent knee surgery- VQ negative for PE. Continue xarelto   #Permanent AFIB- cotninue xarelto.       Medications     carvedilol  6.25 mg Oral BID WC   colchicine  0.6 mg Oral Daily   famotidine  40 mg Oral QHS   gabapentin  300 mg Oral Daily   pantoprazole  40 mg Oral BID   rivaroxaban  20 mg Oral Q supper   sacubitril-valsartan  1 tablet Oral BID   spironolactone  25 mg Oral Daily   torsemide  20 mg Oral Daily     Data Reviewed:   CBG:  No results for input(s): "GLUCAP" in the last 168 hours.  SpO2: 95 % O2 Flow Rate (L/min): 2 L/min FiO2 (%): 21 %    Vitals:   10/27/23 0101 10/27/23 0137 10/27/23 0147 10/27/23 0605  BP:  125/68 139/72 122/82  Pulse:  64 65 68  Resp:  19  19   Temp: 98.1 F (36.7 C)  97.7 F (36.5 C) 97.7 F (36.5 C)  TempSrc: Oral  Oral Oral  SpO2:  95% 97% 95%  Weight:      Height:          Data Reviewed:  Basic Metabolic Panel: Recent Labs  Lab 10/26/23 0910  NA 136  K 3.5  CL 95*  CO2 24  GLUCOSE 149*  BUN 29*  CREATININE 1.48*  CALCIUM 10.7*    CBC: Recent Labs  Lab 10/26/23 0910  WBC 11.3*  HGB 15.0  HCT 45.2  MCV 85.1  PLT 301    LFT Recent Labs  Lab 10/26/23 0910  AST 34  ALT 36  ALKPHOS 63  BILITOT 1.7*  PROT 8.2*  ALBUMIN 4.8     Antibiotics: Anti-infectives (From admission, onward)    None        DVT prophylaxis: Xarelto  Code Status: Full code  Family Communication: No family at bedside   CONSULTS    Subjective   Complains of epigastric discomfort.  Abdominal distention.   Objective    Physical Examination:   General-appears in no acute distress Heart-S1-S2, regular, no murmur auscultated Lungs-clear to auscultation bilaterally, no wheezing or crackles auscultated Abdomen-soft, nontender, no organomegaly Extremities-no edema in the lower extremities Neuro-alert, oriented x3, no focal deficit noted  Status is: Inpatient:  Ozell Blunt   [facility] If 7PM-7AM, please contact night-coverage at [email or web address], Office  [phone]   10/27/2023, 8:33 AM  LOS: 0 days

## 2023-10-27 NOTE — Discharge Summary (Signed)
 Physician Discharge Summary   Patient: [name] MRN: [id number] DOB: 12-06-1950  Admit date:     10/26/2023  Discharge date: 10/27/23  Discharge Physician: Meredeth Ide   PCP: Donita Brooks, MD   Recommendations at discharge:   Follow-up PCP in 1 week  Discharge Diagnoses: Principal Problem:   Abdominal pain  Resolved Problems:   * No resolved hospital problems. *  Hospital Course: 73 y.o. male with medical history significant of mitral valve replacement, paroxysmal A-fib who presented with epigastric pain nausea vomiting.  Patient recent knee replacement and subsequently developed nausea and vomiting.  He has been unable to tolerate much food.  He presented to the ER where he was found to be afebrile and hemodynamically stable     Assessment and Plan:  Epigastric abdominal pain/nausea/vomiting - Patient's symptoms improved with Pepcid - Etiology of patient's symptoms likely related to gastritis or GERD in the setting of taking Zepbound -Continue Pepcid, simethicone as needed for next few days -Stop Zepbound       # Hypertensive urgency -Likely in setting of vomiting, blood pressure is significantly improved Continue home medications including Entresto  # Elevated troponin - Patient found to have hypertension in setting of elevated troponins -Blood pressure has improved - Will continue Entresto, carvedilol, torsemide -Cardiology consulted, had recent echo.  No need for echo in the hospital -Cardiology has cleared patient for discharge, continue home medications   Acute kidney injury -Creatinine 1.48, likely in setting of diuresis Creatinine 1.44 today.  Hypokalemia -Potassium is 3.3 -Will give 1 dose of KCl 40 mEq p.o. before discharge      #Hx of CHF, preserved EF, not in exacerbation- Bnp elevated. Will continue home meds.     #Recent knee surgery- VQ negative for PE. Continue xarelto   #Permanent AFIB- cotninue xarelto.          Consultants:  Procedures performed:  Disposition: Home Diet recommendation:  Discharge Diet Orders (From admission, onward)     Start     Ordered   10/27/23 0000  Diet - low sodium heart healthy        10/27/23 1256           Cardiac diet DISCHARGE MEDICATION: Allergies as of 10/27/2023       Reactions   Metoprolol Tartrate Shortness Of Breath   Oxycodone Rash, Other (See Comments)   Can tolerate Hydrocodone        Medication List     STOP taking these medications    HYDROcodone-acetaminophen 10-325 MG tablet Commonly known as: NORCO   Zepbound 7.5 MG/0.5ML Pen Generic drug: tirzepatide       TAKE these medications    acetaminophen 500 MG tablet Commonly known as: TYLENOL Take 1 tablet (500 mg total) by mouth every 6 (six) hours as needed for mild pain (pain score 1-3) or moderate pain (pain score 4-6).   carvedilol 6.25 MG tablet Commonly known as: COREG TAKE 1 TABLET (6.25 MG TOTAL) BY MOUTH 2 (TWO) TIMES DAILY WITH A MEAL. NEEDS FOLLOW UP APPOINTMENT   colchicine 0.6 MG tablet Take 1 tablet (0.6 mg total) by mouth daily.   dapagliflozin propanediol 10 MG Tabs tablet Commonly known as: Farxiga Take 1 tablet (10 mg total) by mouth daily before breakfast.   diclofenac Sodium 1 % Gel Commonly known as: VOLTAREN Apply 2 g topically daily as needed (pain).   Entresto 24-26 MG Generic drug: sacubitril-valsartan TAKE 1 TABLET BY MOUTH TWICE A DAY  famotidine 20 MG tablet Commonly known as: PEPCID Take 1 tablet (20 mg total) by mouth daily as needed for heartburn or indigestion.   gabapentin 300 MG capsule Commonly known as: NEURONTIN Take 300 mg by mouth daily.   Klor-Con M20 20 MEQ tablet Generic drug: potassium chloride SA TAKE 1 TABLET BY MOUTH EVERY DAY   MAGNESIUM PO Take 1 tablet by mouth daily.   methocarbamol 750 MG tablet Commonly known as: Robaxin-750 Take 1 tablet (750 mg total) by mouth every 8 (eight) hours as needed for  muscle spasms. MAY CAUSE DROWSINESS!   MULTIVITAMIN GUMMIES ADULT PO Take 1 tablet by mouth daily.   NON FORMULARY Pt uses a cpap nightly   ondansetron 4 MG disintegrating tablet Commonly known as: ZOFRAN-ODT Take 1 tablet (4 mg total) by mouth every 8 (eight) hours as needed for nausea or vomiting.   OVER THE COUNTER MEDICATION Swiss krisp for constipstion   OVER THE COUNTER MEDICATION Take 15 mLs by mouth as needed (constipation). duralax   simethicone 80 MG chewable tablet Commonly known as: MYLICON Chew 1 tablet (80 mg total) by mouth every 6 (six) hours as needed for flatulence.   spironolactone 25 MG tablet Commonly known as: ALDACTONE TAKE 1 TABLET BY MOUTH EVERY DAY   tadalafil 10 MG tablet Commonly known as: CIALIS Take 10 mg by mouth daily as needed for erectile dysfunction.   torsemide 20 MG tablet Commonly known as: DEMADEX Take 20 mg by mouth daily.   Xarelto 20 MG Tabs tablet Generic drug: rivaroxaban TAKE 1 TABLET BY MOUTH DAILY WITH SUPPER        Discharge Exam: Filed Weights   10/26/23 0857  Weight: 115.2 kg   General-appears in no acute distress Heart-S1-S2, regular, no murmur auscultated Lungs-clear to auscultation bilaterally, no wheezing or crackles auscultated Abdomen-soft, nontender, no organomegaly Extremities-no edema in the lower extremities Neuro-alert, oriented x3, no focal deficit noted  Condition at discharge: good  The results of significant diagnostics from this hospitalization (including imaging, microbiology, ancillary and laboratory) are listed below for reference.   Imaging Studies: NM Pulmonary Perfusion Result Date: 10/26/2023 CLINICAL DATA:  Positive D-dimer.  Knee replaced 2 weeks ago. EXAM: NUCLEAR MEDICINE PERFUSION LUNG SCAN TECHNIQUE: Perfusion images were obtained in multiple projections after intravenous injection of radiopharmaceutical. Ventilation scans intentionally deferred if perfusion scan and chest x-ray  adequate for interpretation during COVID 19 epidemic. RADIOPHARMACEUTICALS:  4.4 mCi Tc-17m MAA IV COMPARISON:  Chest x-ray 10/26/2023 FINDINGS: Likely artifact seen overlying the posterior left lung on the LPO and left lateral views. Otherwise, perfusion appears homogeneous throughout all other views. IMPRESSION: Very low probability for pulmonary embolism. Electronically Signed   By: Tyron Gallon M.D.   On: 10/26/2023 18:50   CT ABDOMEN PELVIS W CONTRAST Result Date: 10/26/2023 CLINICAL DATA:  Abdominal pain and swelling, nausea and vomiting, constipation. Recent left knee surgery. EXAM: CT ABDOMEN AND PELVIS WITH CONTRAST TECHNIQUE: Multidetector CT imaging of the abdomen and pelvis was performed using the standard protocol following bolus administration of intravenous contrast. RADIATION DOSE REDUCTION: This exam was performed according to the departmental dose-optimization program which includes automated exposure control, adjustment of the mA and/or kV according to patient size and/or use of iterative reconstruction technique. CONTRAST:  OMNIPAQUE IOHEXOL 300 MG/ML  SOLN COMPARISON:  06/17/2020.  Chest radiographs obtained earlier today. FINDINGS: Lower chest: Enlarged heart with an interval increase in size. AICD and epicardial pacemaker leads. Minimal left basilar atelectasis/scarring. Stable right diaphragmatic eventration. Hepatobiliary:  No focal liver abnormality is seen. No gallstones, gallbladder wall thickening, or biliary dilatation. Pancreas: Unremarkable. No pancreatic ductal dilatation or surrounding inflammatory changes. Spleen: Small calcified granuloma.  Otherwise, normal. Adrenals/Urinary Tract: Multiple small bilateral simple appearing renal cysts. These do not need imaging follow-up. Stable small area of cortical scarring with focal calcification involving the left kidney. Unremarkable ureters and urinary bladder. Portions of the bladder obscured by streak artifacts produced by  bilateral hip prostheses. Stomach/Bowel: Stomach is within normal limits. Appendix appears normal. No evidence of bowel wall thickening, distention, or inflammatory changes. Vascular/Lymphatic: No significant vascular findings are present. No enlarged abdominal or pelvic lymph nodes. Reproductive: Mildly enlarged prostate gland, partially obscured by streak artifacts from bilateral hip prostheses. Other: Small to moderate-sized umbilical hernia containing fat. Musculoskeletal: Moderate lumbar and lower thoracic spine degenerative changes. Bilateral hip prostheses with associated streak artifacts. IMPRESSION: 1. No acute abnormality. 2. Enlarged heart with an interval increase in size. 3. Mildly enlarged prostate gland. 4. Small to moderate-sized umbilical hernia containing fat. Electronically Signed   By: Beckie Salts M.D.   On: 10/26/2023 11:14   DG Chest 2 View Result Date: 10/26/2023 CLINICAL DATA:  Mid chest pain, nausea, vomiting and abdominal pain since yesterday. Status post left knee surgery on 10/13/2023. Stopped pain medication 3 days ago. Last bowel movement 4 days ago. EXAM: CHEST - 2 VIEW COMPARISON:  08/23/2019 FINDINGS: Stable enlarged cardiac silhouette, mediastinal wires, left atrial clip and left subclavian pacer and AICD leads. Clear lungs with normal vascularity. Thoracic and upper lumbar spine degenerative changes. IMPRESSION: 1. No acute abnormality. 2. Stable cardiomegaly. Electronically Signed   By: Beckie Salts M.D.   On: 10/26/2023 10:25   DG Knee Left Port Result Date: 10/13/2023 CLINICAL DATA:  Status post knee replacement EXAM: PORTABLE LEFT KNEE - 1-2 VIEW COMPARISON:  None Available. FINDINGS: There is a left knee arthroplasty in anatomic alignment. No evidence for fracture. There is anterior soft tissue swelling and air compatible with recent surgery. IMPRESSION: Left knee arthroplasty in anatomic alignment. Electronically Signed   By: Darliss Cheney M.D.   On: 10/13/2023 15:42    ECHOCARDIOGRAM COMPLETE Result Date: 10/12/2023    ECHOCARDIOGRAM REPORT   Patient Name:   GRACESON NICHELSON Date of Exam: 10/12/2023 Medical Rec #:  [id number]       Height:       70.0 in Accession #:    [id number]      Weight:       259.0 lb Date of Birth:  07-Jul-1951       BSA:          2.330 m Patient Age:    72 years        BP:           121/74 mmHg Patient Gender: M               HR:           63 bpm. [facility] Procedure: 2D Echo, Cardiac Doppler and Color Doppler (Both Spectral and Color            Flow Doppler were utilized during procedure). Indications:    Congestive heart failure/Pre op  History:        Patient has prior history of Echocardiogram examinations. CHF,                 Arrythmias:Atrial Flutter; Risk Factors:Hypertension. 21mm  Biostable HAART Annuoplasty Ring 05/2018.                 Aortic Valve: 21 mm Biostable HAART annuloplasty ring valve is                 present in the aortic position. Procedure Date: 05/2018.                 Mitral Valve: 30 mm Sorrin Memo 4D Ring Prosthetic Annuloplasty                 Ring valve is present in the mitral position. Procedure Date:                 05/2018.  Sonographer:    Lamont Snowball Referring Phys: [phone] DALTON S MCLEAN IMPRESSIONS  1. Left ventricular ejection fraction, by estimation, is 55 to 60%. The left ventricle has normal function. The left ventricle has no regional wall motion abnormalities. Left ventricular diastolic parameters are indeterminate.  2. Right ventricular systolic function is normal. The right ventricular size is mildly enlarged. There is normal pulmonary artery systolic pressure.  3. Left atrial size was moderately dilated.  4. Right atrial size was moderately dilated.  5. The mitral valve has been repaired/replaced. Trivial mitral valve regurgitation. No evidence of mitral stenosis. The mean mitral valve gradient is 3.0 mmHg. There is a 30 mm Sorrin Memo 4D Ring Prosthetic Annuloplasty Ring  present in the mitral position. Procedure Date: 05/2018.  6. The aortic valve has been repaired/replaced. Aortic valve regurgitation is not visualized. There is a 21 mm Biostable HAART annuloplasty ring valve present in the aortic position. Procedure Date: 05/2018. Aortic valve area, by VTI measures 2.56 cm. Aortic valve mean gradient measures 10.0 mmHg.  7. Aortic dilatation noted. There is mild dilatation of the ascending aorta, measuring 42 mm.  8. The inferior vena cava is dilated in size with >50% respiratory variability, suggesting right atrial pressure of 8 mmHg. Comparison(s): No significant change from prior study. FINDINGS  Left Ventricle: Left ventricular ejection fraction, by estimation, is 55 to 60%. The left ventricle has normal function. The left ventricle has no regional wall motion abnormalities. Strain was performed and the global longitudinal strain is indeterminate. The left ventricular internal cavity size was normal in size. There is no left ventricular hypertrophy. Left ventricular diastolic parameters are indeterminate. Right Ventricle: The right ventricular size is mildly enlarged. No increase in right ventricular wall thickness. Right ventricular systolic function is normal. There is normal pulmonary artery systolic pressure. The tricuspid regurgitant velocity is 2.48  m/s, and with an assumed right atrial pressure of 8 mmHg, the estimated right ventricular systolic pressure is 32.6 mmHg. Left Atrium: Left atrial size was moderately dilated. Right Atrium: Right atrial size was moderately dilated. Pericardium: There is no evidence of pericardial effusion. Mitral Valve: The mitral valve has been repaired/replaced. Trivial mitral valve regurgitation. There is a 30 mm Sorrin Memo 4D Ring Prosthetic Annuloplasty Ring present in the mitral position. Procedure Date: 05/2018. No evidence of mitral valve stenosis. MV peak gradient, 12.8 mmHg. The mean mitral valve gradient is 3.0 mmHg. Tricuspid  Valve: The tricuspid valve is normal in structure. Tricuspid valve regurgitation is trivial. No evidence of tricuspid stenosis. Aortic Valve: The aortic valve has been repaired/replaced. Aortic valve regurgitation is not visualized. Aortic valve mean gradient measures 10.0 mmHg. Aortic valve peak gradient measures 19.5 mmHg. Aortic valve area, by VTI measures 2.56 cm. There is a  21 mm  Biostable HAART annuloplasty ring valve present in the aortic position. Procedure Date: 05/2018. Pulmonic Valve: The pulmonic valve was not well visualized. Pulmonic valve regurgitation is not visualized. No evidence of pulmonic stenosis. Aorta: The aortic root is normal in size and structure and aortic dilatation noted. There is mild dilatation of the ascending aorta, measuring 42 mm. Venous: The inferior vena cava is dilated in size with greater than 50% respiratory variability, suggesting right atrial pressure of 8 mmHg. IAS/Shunts: No atrial level shunt detected by color flow Doppler. Additional Comments: 3D was performed not requiring image post processing on an independent workstation and was indeterminate. A device lead is visualized.  LEFT VENTRICLE PLAX 2D LVIDd:         5.40 cm   Diastology LVIDs:         4.00 cm   LV e' medial:    8.16 cm/s LV PW:         1.10 cm   LV E/e' medial:  20.6 LV IVS:        1.10 cm   LV e' lateral:   7.51 cm/s LVOT diam:     2.20 cm   LV E/e' lateral: 22.4 LV SV:         106 LV SV Index:   46 LVOT Area:     3.80 cm  RIGHT VENTRICLE             IVC RV Basal diam:  4.50 cm     IVC diam: 2.40 cm RV S prime:     10.30 cm/s TAPSE (M-mode): 2.2 cm LEFT ATRIUM              Index        RIGHT ATRIUM           Index LA diam:        6.10 cm  2.62 cm/m   RA Area:     29.40 cm LA Vol (A2C):   117.0 ml 50.22 ml/m  RA Volume:   95.50 ml  40.99 ml/m LA Vol (A4C):   104.0 ml 44.64 ml/m LA Biplane Vol: 117.0 ml 50.22 ml/m  AORTIC VALVE AV Area (Vmax):    2.27 cm AV Area (Vmean):   2.34 cm AV Area (VTI):      2.56 cm AV Vmax:           221.00 cm/s AV Vmean:          146.000 cm/s AV VTI:            0.414 m AV Peak Grad:      19.5 mmHg AV Mean Grad:      10.0 mmHg LVOT Vmax:         132.00 cm/s LVOT Vmean:        89.700 cm/s LVOT VTI:          0.279 m LVOT/AV VTI ratio: 0.67  AORTA Ao Root diam: 3.60 cm Ao Asc diam:  4.20 cm MITRAL VALVE                TRICUSPID VALVE MV Area (PHT): 3.20 cm     TR Peak grad:   24.6 mmHg MV Area VTI:   2.36 cm     TR Vmax:        248.00 cm/s MV Peak grad:  12.8 mmHg MV Mean grad:  3.0 mmHg     SHUNTS MV Vmax:       1.79 m/s     Systemic  VTI:  0.28 m MV Vmean:      80.4 cm/s    Systemic Diam: 2.20 cm MV Decel Time: 237 msec MV E velocity: 168.50 cm/s MV A velocity: 70.60 cm/s MV E/A ratio:  2.39 Vishnu Priya Mallipeddi Electronically signed by Lucetta Russel Mallipeddi Signature Date/Time: 10/12/2023/5:17:12 PM    Final     Microbiology: Results for orders placed or performed during the hospital encounter of 10/02/23  Surgical pcr screen     Status: None   Collection Time: 10/02/23  2:14 PM   Specimen: Nasal Mucosa; Nasal Swab  Result Value Ref Range Status   MRSA, PCR NEGATIVE NEGATIVE Final   Staphylococcus aureus NEGATIVE NEGATIVE Final    Comment: (NOTE) The Xpert SA Assay (FDA approved for NASAL specimens in patients 44 years of age and older), is one component of a comprehensive surveillance program. It is not intended to diagnose infection nor to guide or monitor treatment. Performed at [facility], [address], [place], [place] [address]     Labs: CBC: Recent Labs  Lab 10/26/23 0910  WBC 11.3*  HGB 15.0  HCT 45.2  MCV 85.1  PLT 301   Basic Metabolic Panel: Recent Labs  Lab 10/26/23 0910 10/27/23 1055  NA 136 135  K 3.5 3.3*  CL 95* 97*  CO2 24 25  GLUCOSE 149* 126*  BUN 29* 27*  CREATININE 1.48* 1.44*  CALCIUM 10.7* 9.5   Liver Function Tests: Recent Labs  Lab 10/26/23 0910  AST 34  ALT 36  ALKPHOS 63   BILITOT 1.7*  PROT 8.2*  ALBUMIN 4.8   CBG: No results for input(s): "GLUCAP" in the last 168 hours.  Discharge time spent: greater than 30 minutes.  Signed: Ozell Blunt, MD [facility] 10/27/2023

## 2023-10-27 NOTE — Progress Notes (Signed)
 Progress Note  Patient Name: [name] Date of Encounter: 10/27/2023  Primary Cardiologist: Nanetta Batty, MD  Subjective   Patient resting.  I spoke with the wife today.  She revealed that he started to have symptoms of abdominal discomfort, nausea and vomiting after he took Zepbound last week Wednesday that worsened couple of days prior to presentation and hence this prompted ER visit.  Inpatient Medications    Scheduled Meds:  carvedilol  6.25 mg Oral BID WC   colchicine  0.6 mg Oral Daily   gabapentin  300 mg Oral Daily   pantoprazole  40 mg Oral BID   potassium chloride  40 mEq Oral Once   rivaroxaban  20 mg Oral Q supper   sacubitril-valsartan  1 tablet Oral BID   simethicone  80 mg Oral QID   spironolactone  25 mg Oral Daily   torsemide  20 mg Oral Daily   Continuous Infusions:  PRN Meds: acetaminophen **OR** acetaminophen, methocarbamol, ondansetron **OR** ondansetron (ZOFRAN) IV, mouth rinse   Vital Signs    Vitals:   10/27/23 0101 10/27/23 0137 10/27/23 0147 10/27/23 0605  BP:  125/68 139/72 122/82  Pulse:  64 65 68  Resp:  19 19   Temp: 98.1 F (36.7 C)  97.7 F (36.5 C) 97.7 F (36.5 C)  TempSrc: Oral  Oral Oral  SpO2:  95% 97% 95%  Weight:      Height:        Intake/Output Summary (Last 24 hours) at 10/27/2023 1310 Last data filed at 10/26/2023 2121 Gross per 24 hour  Intake 44.48 ml  Output 475 ml  Net -430.52 ml   Filed Weights   10/26/23 0857  Weight: 115.2 kg    Telemetry     Personally reviewed.  ECG    Not performed today.  Physical Exam   Patient resting.  Labs    Chemistry Recent Labs  Lab 10/26/23 0910 10/27/23 1055  NA 136 135  K 3.5 3.3*  CL 95* 97*  CO2 24 25  GLUCOSE 149* 126*  BUN 29* 27*  CREATININE 1.48* 1.44*  CALCIUM 10.7* 9.5  PROT 8.2*  --   ALBUMIN 4.8  --   AST 34  --   ALT 36  --   ALKPHOS 63  --   BILITOT 1.7*  --   GFRNONAA 51* 52*  ANIONGAP 17* 13     Hematology Recent Labs   Lab 10/26/23 0910  WBC 11.3*  RBC 5.31  HGB 15.0  HCT 45.2  MCV 85.1  MCH 28.2  MCHC 33.2  RDW 14.1  PLT 301    Cardiac Enzymes Recent Labs  Lab 10/26/23 0910 10/26/23 1136 10/26/23 1516 10/26/23 1800  TROPONINIHS 32* 41* 66* 68*    BNP Recent Labs  Lab 10/26/23 0910  BNP 461.0*     DDimer Recent Labs  Lab 10/26/23 0910  DDIMER 1.69*     Radiology    NM Pulmonary Perfusion Result Date: 10/26/2023 CLINICAL DATA:  Positive D-dimer.  Knee replaced 2 weeks ago. EXAM: NUCLEAR MEDICINE PERFUSION LUNG SCAN TECHNIQUE: Perfusion images were obtained in multiple projections after intravenous injection of radiopharmaceutical. Ventilation scans intentionally deferred if perfusion scan and chest x-ray adequate for interpretation during COVID 19 epidemic. RADIOPHARMACEUTICALS:  4.4 mCi Tc-66m MAA IV COMPARISON:  Chest x-ray 10/26/2023 FINDINGS: Likely artifact seen overlying the posterior left lung on the LPO and left lateral views. Otherwise, perfusion appears homogeneous throughout all other views. IMPRESSION: Very low  probability for pulmonary embolism. Electronically Signed   By: Tyron Gallon M.D.   On: 10/26/2023 18:50   CT ABDOMEN PELVIS W CONTRAST Result Date: 10/26/2023 CLINICAL DATA:  Abdominal pain and swelling, nausea and vomiting, constipation. Recent left knee surgery. EXAM: CT ABDOMEN AND PELVIS WITH CONTRAST TECHNIQUE: Multidetector CT imaging of the abdomen and pelvis was performed using the standard protocol following bolus administration of intravenous contrast. RADIATION DOSE REDUCTION: This exam was performed according to the departmental dose-optimization program which includes automated exposure control, adjustment of the mA and/or kV according to patient size and/or use of iterative reconstruction technique. CONTRAST:  OMNIPAQUE IOHEXOL 300 MG/ML  SOLN COMPARISON:  06/17/2020.  Chest radiographs obtained earlier today. FINDINGS: Lower chest: Enlarged heart  with an interval increase in size. AICD and epicardial pacemaker leads. Minimal left basilar atelectasis/scarring. Stable right diaphragmatic eventration. Hepatobiliary: No focal liver abnormality is seen. No gallstones, gallbladder wall thickening, or biliary dilatation. Pancreas: Unremarkable. No pancreatic ductal dilatation or surrounding inflammatory changes. Spleen: Small calcified granuloma.  Otherwise, normal. Adrenals/Urinary Tract: Multiple small bilateral simple appearing renal cysts. These do not need imaging follow-up. Stable small area of cortical scarring with focal calcification involving the left kidney. Unremarkable ureters and urinary bladder. Portions of the bladder obscured by streak artifacts produced by bilateral hip prostheses. Stomach/Bowel: Stomach is within normal limits. Appendix appears normal. No evidence of bowel wall thickening, distention, or inflammatory changes. Vascular/Lymphatic: No significant vascular findings are present. No enlarged abdominal or pelvic lymph nodes. Reproductive: Mildly enlarged prostate gland, partially obscured by streak artifacts from bilateral hip prostheses. Other: Small to moderate-sized umbilical hernia containing fat. Musculoskeletal: Moderate lumbar and lower thoracic spine degenerative changes. Bilateral hip prostheses with associated streak artifacts. IMPRESSION: 1. No acute abnormality. 2. Enlarged heart with an interval increase in size. 3. Mildly enlarged prostate gland. 4. Small to moderate-sized umbilical hernia containing fat. Electronically Signed   By: Catherin Closs M.D.   On: 10/26/2023 11:14   DG Chest 2 View Result Date: 10/26/2023 CLINICAL DATA:  Mid chest pain, nausea, vomiting and abdominal pain since yesterday. Status post left knee surgery on 10/13/2023. Stopped pain medication 3 days ago. Last bowel movement 4 days ago. EXAM: CHEST - 2 VIEW COMPARISON:  08/23/2019 FINDINGS: Stable enlarged cardiac silhouette, mediastinal wires,  left atrial clip and left subclavian pacer and AICD leads. Clear lungs with normal vascularity. Thoracic and upper lumbar spine degenerative changes. IMPRESSION: 1. No acute abnormality. 2. Stable cardiomegaly. Electronically Signed   By: Catherin Closs M.D.   On: 10/26/2023 10:25    Assessment & Plan   Abdominal pain, nausea and vomiting: He started to have symptoms of abdominal pain, nausea and vomiting after he took Zepbound on 10/21/2023 that started to get worse over the weekend and prompted ER visit.  Abdominal imaging negative for any obstruction or severe constipation. His abdominal pain significantly improved with Pepcid and enema even though he did not have major BM with enema.  His GI issues are likely drug-induced, from Zepbound.  Discontinue Zepbound upon discharge. He will not be a candidate for other GLP-1 agonist in the future.  Supportive care for his GI symptoms.  Due to recent knee surgery, he underwent VQ scan that was negative.  Chest tightness: He had chest tightness when he was vomiting profusely every 30 minutes (prior to admission).  This chest tightness resolved after he stopped vomiting.  Noncardiac.  No cardiac workup at this time.  Chronic diastolic heart failure: BNP is  elevated likely in the setting of hypertensive urgency.  He does not have any symptoms of SOB.  He received IVF in the ER.  Continue home GDMT.   Permanent A-fib/flutter s/p unsuccessful maze procedure in 2019, AV nodal ablation s/p CRT-D: EKG on admission showed atrial fibrillation with V paced rhythm.  Underwent LAA ligation at the time of maze procedure.  Currently on Xarelto, per advanced heart failure team.  Recommend to continue Xarelto despite LAA ligation (due to risk factors).   History of aortic and mitral valve repairs: Echo in March 2025 showed normal aortic and mitral valve repairs.   Nonobstructive CAD in 2019: He presented with a chest tightness and abdominal discomfort but this chest  tightness is likely secondary to vomiting, noncardiac.   OSA on CPAP: Continue CPAP.   HTN, poorly controlled: BP 187/93 upon arrival to the ER.  BP controlled now.  Continue home medications.   Recommendations are relayed to the primary team.   [facility] will sign off.   Medication Recommendations: Continue home medications except Zepbound. Other recommendations (labs, testing, etc): None Follow up as an outpatient: Keep cardiology appointment in June/July.  No indication for closer follow-up.    Signed, Lasalle Pointer, MD  10/27/2023, 1:10 PM

## 2023-10-27 NOTE — H&P (Signed)
 History and Physical    [name] XBJ:[id number] DOB: 27-Jan-1951 DOA: 10/26/2023  PCP: Donita Brooks, MD   Chief Complaint: Chest discomfort  HPI: [name] is a 73 y.o. male with medical history significant of mitral valve replacement, paroxysmal A-fib who presented with epigastric pain nausea vomiting.  Patient recent knee replacement and subsequently developed nausea and vomiting.  He has been unable to tolerate much food.  He presented to the ER where he was found to be afebrile and hemodynamically stable.  Labs were obtained on presentation which showed creatinine 1.4, WBC 11.3, hemoglobin 15, troponin 32, lipase 35, BNP 461, urinalysis negative for infection, troponin 41, 68, 66.  Patient underwent CT abdomen pelvis which showed enlarged heart with umbilical hernia.  VQ scan showed low concern for PE.  Cardiology was consulted and recommended admission with echocardiogram.  On evaluation patient endorsing epigastric discomfort that resolved with Pepcid and is worse with particular foods.  He is also endorsing persistent constipation.  Patient was having hypertension and cardiology thought etiology of his elevated troponin may be due to hypertensive urgency.  Patient was admitted to the hospital service for further workup.  On evaluation he states that his epigastric pain did resolve with Pepcid.   Review of Systems: Review of Systems  Constitutional: Negative.  Negative for chills and fever.  HENT: Negative.    Eyes: Negative.   Respiratory: Negative.    Cardiovascular: Negative.   Gastrointestinal: Negative.   Genitourinary: Negative.   Musculoskeletal: Negative.   Skin: Negative.   Neurological: Negative.   Psychiatric/Behavioral: Negative.       As per HPI otherwise 10 point review of systems negative.   Allergies  Allergen Reactions   Metoprolol Tartrate Shortness Of Breath   Oxycodone Rash and Other (See Comments)    Can tolerate Hydrocodone    Past  Medical History:  Diagnosis Date   AICD (automatic cardioverter/defibrillator) present    Aortic insufficiency 04/21/2018   Aortic insufficiency    Arthritis    "joints" (09/26/2013)   Atrial enlargement, left    CHF (congestive heart failure) (HCC)    Dysrhythmia    A. fib   Essential hypertension 11/28/2020   Hearing aid worn    both ears   Mitral regurgitation 04/21/2018   Neuromuscular disorder (HCC)    Neuropathy in both feet   Obesity    OSA on CPAP    "mask adjusts to what I need" (09/26/2013)   Persistent atrial fibrillation (HCC)    cardioversion 06/06/13   PONV (postoperative nausea and vomiting)    Rotator cuff tear, right 06/2011   physical therapy done, decreased strength   S/P aortic valve repair 05/18/2018   Complex valvuloplasty including plication of right coronary leaflet and 21 mm Biostable HAART annuloplasty ring   S/P Maze operation for atrial fibrillation 05/18/2018   Complete bilateral atrial lesion set using bipolar radiofrequency and cryothermy ablation with clipping of LA appendage   S/P mitral valve repair 05/18/2018   Complex valvuloplasty including artificial Gore-tex neochord placement x4 and 30 mm Sorin Memo 4D ring annuloplasty   Wears glasses     Past Surgical History:  Procedure Laterality Date   AORTIC VALVE REPAIR N/A 05/18/2018   Procedure: AORTIC VALVE REPAIR using HAART 300 Aortic Annuloplasty Device size 21mm;  Surgeon: Purcell Nails, MD;  Location: [facility] OR;  Service: Open Heart Surgery;  Laterality: N/A;   ATRIAL FIBRILLATION ABLATION N/A 04/26/2019   Procedure: ATRIAL FIBRILLATION ABLATION;  Surgeon: Jolly Needle, MD;  Location: [facility] INVASIVE CV LAB;  Service: Cardiovascular;  Laterality: N/A;   AV NODE ABLATION N/A 12/20/2019   Procedure: AV NODE ABLATION;  Surgeon: Jolly Needle, MD;  Location: [facility] INVASIVE CV LAB;  Service: Cardiovascular;  Laterality: N/A;   BIV ICD INSERTION CRT-D N/A 08/23/2019   Procedure: BIV ICD INSERTION  CRT-D;  Surgeon: Jolly Needle, MD;  Location: [facility] INVASIVE CV LAB;  Service: Cardiovascular;  Laterality: N/A;   CARDIOVERSION N/A 06/06/2013   Procedure: CARDIOVERSION;  Surgeon: Luana Rumple, MD;  Location: [facility] ENDOSCOPY;  Service: Cardiovascular;  Laterality: N/A;   CARDIOVERSION N/A 09/28/2013   Procedure: CARDIOVERSION BEDSIDE;  Surgeon: Peter M Swaziland, MD;  Location: [facility] OR;  Service: Cardiovascular;  Laterality: N/A;   CARDIOVERSION N/A 11/30/2018   Procedure: CARDIOVERSION;  Surgeon: Liza Riggers, MD;  Location: [facility] ENDOSCOPY;  Service: Cardiovascular;  Laterality: N/A;   CARDIOVERSION N/A 03/10/2019   Procedure: CARDIOVERSION;  Surgeon: Wendie Hamburg, MD;  Location: [facility] ENDOSCOPY;  Service: Endoscopy;  Laterality: N/A;   CARDIOVERSION N/A 12/20/2021   Procedure: CARDIOVERSION;  Surgeon: Darlis Eisenmenger, MD;  Location: [facility] ENDOSCOPY;  Service: Cardiovascular;  Laterality: N/A;   CARPAL TUNNEL RELEASE Right 08/08/2013   Procedure: RIGHT CARPAL TUNNEL RELEASE;  Surgeon: Kemp Patter, MD;  Location: [facility];  Service: Orthopedics;  Laterality: Right;   CARPAL TUNNEL RELEASE Left 07/14/2006   CLIPPING OF ATRIAL APPENDAGE  05/18/2018   Procedure: CLIPPING OF ATRIAL APPENDAGE using AtriCure Clip size 45;  Surgeon: Gardenia Jump, MD;  Location: [facility] OR;  Service: Open Heart Surgery;;   KNEE ARTHROSCOPY Right 03/14/1989   MAZE N/A 05/18/2018   Procedure: MAZE;  Surgeon: Gardenia Jump, MD;  Location: [facility] OR;  Service: Open Heart Surgery;  Laterality: N/A;   MITRAL VALVE REPAIR N/A 05/18/2018   Procedure: MITRAL VALVE REPAIR (MVR) using 4D Memo Ring size 30;  Surgeon: Gardenia Jump, MD;  Location: [facility] OR;  Service: Open Heart Surgery;  Laterality: N/A;   PACEMAKER INSERTION     RIGHT/LEFT HEART CATH AND CORONARY ANGIOGRAPHY N/A 04/21/2018   Procedure: RIGHT/LEFT HEART CATH AND CORONARY ANGIOGRAPHY;  Surgeon: Sammy Crisp, MD;  Location: [facility] INVASIVE CV  LAB;  Service: Cardiovascular;  Laterality: N/A;   SHOULDER OPEN ROTATOR CUFF REPAIR Left 03/14/1989   SHOULDER OPEN ROTATOR CUFF REPAIR Right 11/12/2007   TEE WITHOUT CARDIOVERSION N/A 04/21/2018   Procedure: TRANSESOPHAGEAL ECHOCARDIOGRAM (TEE);  Surgeon: Hugh Madura, MD;  Location: [facility] ENDOSCOPY;  Service: Cardiovascular;  Laterality: N/A;   TEE WITHOUT CARDIOVERSION N/A 05/18/2018   Procedure: TRANSESOPHAGEAL ECHOCARDIOGRAM (TEE);  Surgeon: Gardenia Jump, MD;  Location: [facility] OR;  Service: Open Heart Surgery;  Laterality: N/A;   TONSILLECTOMY  1950's   TOTAL HIP ARTHROPLASTY Left 07/14/2008   TOTAL HIP ARTHROPLASTY Right 05/29/2020   Procedure: TOTAL HIP ARTHROPLASTY ANTERIOR APPROACH;  Surgeon: Saundra Curl, MD;  Location: [facility] ORS;  Service: Orthopedics;  Laterality: Right;   TOTAL HIP REVISION Left 10/08/2011   TOTAL HIP REVISION  04/09/2012   Procedure: TOTAL HIP REVISION;  Surgeon: Aurther Blue, MD;  Location: [facility] ORS;  Service: Orthopedics;  Laterality: Left;  Left Hip Acetabular Revision vs Constrained Liner   TOTAL KNEE ARTHROPLASTY Right 07/14/2004   TOTAL KNEE ARTHROPLASTY Left 10/13/2023   Procedure: ARTHROPLASTY, KNEE, TOTAL;  Surgeon: Saundra Curl, MD;  Location: [facility] ORS;  Service: Orthopedics;  Laterality: Left;   TRIGGER FINGER RELEASE Right 08/08/2013  Procedure: RELEASE STENOSING TENOSYNOVITIS RIGHT THUMB;  Surgeon: Nicki Reaper, MD;  Location: [facility];  Service: Orthopedics;  Laterality: Right;     reports that he has never smoked. He has never been exposed to tobacco smoke. He has never used smokeless tobacco. He reports that he does not drink alcohol and does not use drugs.  Family History  Problem Relation Age of Onset   Heart Problems Mother    Arthritis Mother    Cancer Sister    Other Sister        bilateral hip replacements   Healthy Daughter    Healthy Son    Healthy Son    Other Other        mother had a pacemaker     Prior to Admission medications   Medication Sig Start Date End Date Taking? Authorizing Provider  acetaminophen (TYLENOL) 500 MG tablet Take 1 tablet (500 mg total) by mouth every 6 (six) hours as needed for mild pain (pain score 1-3) or moderate pain (pain score 4-6). 10/13/23  Yes Gawne, Meghan M, PA-C  carvedilol (COREG) 6.25 MG tablet TAKE 1 TABLET (6.25 MG TOTAL) BY MOUTH 2 (TWO) TIMES DAILY WITH A MEAL. NEEDS FOLLOW UP APPOINTMENT 11/03/22  Yes Laurey Morale, MD  colchicine 0.6 MG tablet Take 1 tablet (0.6 mg total) by mouth daily. 04/28/23  Yes Donita Brooks, MD  dapagliflozin propanediol (FARXIGA) 10 MG TABS tablet Take 1 tablet (10 mg total) by mouth daily before breakfast. 02/04/23  Yes Laurey Morale, MD  diclofenac Sodium (VOLTAREN) 1 % GEL Apply 2 g topically daily as needed (pain).  12/14/19  Yes [provider]  ENTRESTO 24-26 MG TAKE 1 TABLET BY MOUTH TWICE A DAY 10/20/23  Yes Laurey Morale, MD  gabapentin (NEURONTIN) 300 MG capsule Take 300 mg by mouth daily.   Yes [provider]  HYDROcodone-acetaminophen (NORCO) 10-325 MG tablet Take 1-2 tablets by mouth every 4 (four) hours as needed for severe pain (pain score 7-10). 10/13/23  Yes Gawne, Meghan M, PA-C  KLOR-CON M20 20 MEQ tablet TAKE 1 TABLET BY MOUTH EVERY DAY 09/24/23  Yes Laurey Morale, MD  MAGNESIUM PO Take 1 tablet by mouth daily.   Yes [provider]  methocarbamol (ROBAXIN-750) 750 MG tablet Take 1 tablet (750 mg total) by mouth every 8 (eight) hours as needed for muscle spasms. MAY CAUSE DROWSINESS! 10/13/23  Yes Gawne, Meghan M, PA-C  Multiple Vitamins-Minerals (MULTIVITAMIN GUMMIES ADULT PO) Take 1 tablet by mouth daily.   Yes [provider]  NON FORMULARY Pt uses a cpap nightly   Yes [provider]  ondansetron (ZOFRAN-ODT) 4 MG disintegrating tablet Take 1 tablet (4 mg total) by mouth every 8 (eight) hours as needed for nausea or vomiting. 10/13/23  Yes Gawne,  Meghan M, PA-C  OVER THE COUNTER MEDICATION Swiss krisp for constipstion   Yes [provider]  OVER THE COUNTER MEDICATION Take 15 mLs by mouth as needed (constipation). duralax   Yes [provider]  spironolactone (ALDACTONE) 25 MG tablet TAKE 1 TABLET BY MOUTH EVERY DAY 09/24/23  Yes Laurey Morale, MD  tadalafil (CIALIS) 10 MG tablet Take 10 mg by mouth daily as needed for erectile dysfunction. 10/16/20  Yes [provider]  tirzepatide (ZEPBOUND) 7.5 MG/0.5ML Pen Inject 7.5 mg into the skin once a week. 10/15/23  Yes Pavero, Cristal Deer, RPH  torsemide (DEMADEX) 20 MG tablet Take 20 mg by mouth  daily.   Yes [provider]  XARELTO 20 MG TABS tablet TAKE 1 TABLET BY MOUTH DAILY WITH SUPPER 06/04/23  Yes Darlis Eisenmenger, MD    Physical Exam: Vitals:   10/26/23 2026 10/26/23 2141 10/26/23 2300 10/27/23 0101  BP:   101/63   Pulse:  67 64   Resp:  17 (!) 26   Temp: 98.3 F (36.8 C)   98.1 F (36.7 C)  TempSrc: Oral   Oral  SpO2:  97% 93%   Weight:      Height:       Physical Exam Vitals reviewed.  Constitutional:      Appearance: He is normal weight.  HENT:     Head: Normocephalic.  Cardiovascular:     Rate and Rhythm: Normal rate and regular rhythm.     Heart sounds: Normal heart sounds.  Pulmonary:     Effort: Pulmonary effort is normal.  Abdominal:     General: Bowel sounds are normal.     Palpations: Abdomen is soft.  Musculoskeletal:        General: Normal range of motion.     Cervical back: Normal range of motion.  Skin:    Capillary Refill: Capillary refill takes less than 2 seconds.  Neurological:     General: No focal deficit present.     Mental Status: He is alert.  Psychiatric:        Mood and Affect: Mood normal.        Behavior: Behavior normal.        Labs on Admission: I have personally reviewed the patients's labs and imaging studies.  Assessment/Plan Principal Problem:   Abdominal pain   # Epigastric  abdominal pain/nausea/vomiting - Patient's symptoms improved with Pepcid - Etiology of patient's symptoms likely related to gastritis or GERD  Plan: Continue PPI Nightly Pepcid  # Hypertensive urgency # Elevated troponin - Patient found to have hypertension in setting of elevated troponins - Will continue Entresto, carvedilol, torsemide  Plan: Obtain echocardiogram in morning Appreciated Cards eval  #Constipation- as needed bowel regimen  #Hx of CHF, preserved EF, not in exacerbation- Bnp elevated. Will continue home meds  #Recent knee surgery- VQ negative for PE. Continue xarelto  #Permanent AFIB- cotninue xarelto.    Admission status: Observation Telemetry  Certification: The appropriate patient status for this patient is OBSERVATION. Observation status is judged to be reasonable and necessary in order to provide the required intensity of service to ensure the patient's safety. The patient's presenting symptoms, physical exam findings, and initial radiographic and laboratory data in the context of their medical condition is felt to place them at decreased risk for further clinical deterioration. Furthermore, it is anticipated that the patient will be medically stable for discharge from the hospital within 2 midnights of admission.     Myrl Askew MD [facility] If 7PM-7AM, please contact night-coverage [email or web address]  10/27/2023, 1:14 AM

## 2023-10-27 NOTE — Progress Notes (Signed)
 IV removed. Discharged via wheelchair with wife.

## 2023-10-28 ENCOUNTER — Telehealth: Payer: Self-pay

## 2023-10-28 ENCOUNTER — Ambulatory Visit: Payer: Self-pay

## 2023-10-28 ENCOUNTER — Telehealth: Payer: Self-pay | Admitting: Pharmacist

## 2023-10-28 DIAGNOSIS — M1712 Unilateral primary osteoarthritis, left knee: Secondary | ICD-10-CM | POA: Diagnosis not present

## 2023-10-28 NOTE — Telephone Encounter (Signed)
 2nd attempt, no answer, LVM with CB # [phone]

## 2023-10-28 NOTE — Telephone Encounter (Signed)
 Patient called to report he will no longer be taking Zepbound. Held medication for a week before surgery. Restarted after surgery and had abdominal pain with n/v.

## 2023-10-28 NOTE — Transitions of Care (Post Inpatient/ED Visit) (Signed)
 10/28/2023  Name: [name] MRN: [id number] DOB: October 15, 1950  Today's TOC FU Call Status: Today's TOC FU Call Status:: Successful TOC FU Call Completed TOC FU Call Complete Date: 10/28/23 Patient's Name and Date of Birth confirmed.  Transition Care Management Follow-up Telephone Call Date of Discharge: 10/26/23 Dis[facility] Type of Discharge: Emergency Department Reason for ED Visit: Other: (dyspepsia) How have you been since you were released from the hospital?: Same Any questions or concerns?: No  Items Reviewed: Did you receive and understand the discharge instructions provided?: Yes Medications obtained,verified, and reconciled?: Yes (Medications Reviewed) Any new allergies since your discharge?: No Dietary orders reviewed?: Yes Do you have support at home?: Yes People in Home [RPT]: spouse  Medications Reviewed Today: Medications Reviewed Today     Reviewed by Karena Addison, LPN (Licensed Practical Nurse) on 10/28/23 at 1309  Med List Status: <None>   Medication Order Taking? Sig Documenting Provider Last Dose Status Informant  acetaminophen (TYLENOL) 500 MG tablet [id number] No Take 1 tablet (500 mg total) by mouth every 6 (six) hours as needed for mild pain (pain score 1-3) or moderate pain (pain score 4-6). Jenne Pane, PA-C Past Week Active Spouse/Significant Other, Pharmacy Records  carvedilol (COREG) 6.25 MG tablet [id number] No TAKE 1 TABLET (6.25 MG TOTAL) BY MOUTH 2 (TWO) TIMES DAILY WITH A MEAL. NEEDS FOLLOW UP APPOINTMENT Laurey Morale, MD 10/25/2023 Morning Active Spouse/Significant Other, Pharmacy Records  colchicine 0.6 MG tablet [id number] No Take 1 tablet (0.6 mg total) by mouth daily. Donita Brooks, MD 10/25/2023 Morning Active Spouse/Significant Other, Pharmacy Records  dapagliflozin propanediol (FARXIGA) 10 MG TABS tablet [id number] No Take 1 tablet (10 mg total) by mouth daily before breakfast. Laurey Morale, MD  10/25/2023 Morning Active Spouse/Significant Other, Pharmacy Records  diclofenac Sodium (VOLTAREN) 1 % GEL [id number] No Apply 2 g topically daily as needed (pain).  [provider] Past Week Active Spouse/Significant Other, Pharmacy Records  [place] [address] [place] [id number] No TAKE 1 TABLET BY MOUTH TWICE A DAY Laurey Morale, MD 10/25/2023 Morning Active Spouse/Significant Other, Pharmacy Records  famotidine (PEPCID) 20 MG tablet [id number]  Take 1 tablet (20 mg total) by mouth daily as needed for heartburn or indigestion. Meredeth Ide, MD  Active   gabapentin (NEURONTIN) 300 MG capsule [id number] No Take 300 mg by mouth daily. [provider] Past Month Active Spouse/Significant Other, Pharmacy Records  KLOR-CON M20 20 MEQ tablet [id number] No TAKE 1 TABLET BY MOUTH EVERY DAY Laurey Morale, MD Past Week Active Spouse/Significant Other, Pharmacy Records  MAGNESIUM PO [id number] No Take 1 tablet by mouth daily. [provider] Past Week Active Spouse/Significant Other, Pharmacy Records  methocarbamol (ROBAXIN-750) 750 MG tablet [id number] No Take 1 tablet (750 mg total) by mouth every 8 (eight) hours as needed for muscle spasms. MAY CAUSE DROWSINESS! Jenne Pane, PA-C Past Month Active Spouse/Significant Other, Pharmacy Records  Multiple Vitamins-Minerals (MULTIVITAMIN GUMMIES ADULT PO) [id number] No Take 1 tablet by mouth daily. [provider] Past Week Active Spouse/Significant Other, Pharmacy Records  NON FORMULARY [id number] No Pt uses a cpap nightly [provider] 10/25/2023 Bedtime Active Spouse/Significant Other, Pharmacy Records  ondansetron (ZOFRAN-ODT) 4 MG disintegrating tablet [id number] No Take 1 tablet (4 mg total) by mouth every 8 (eight) hours as needed for nausea or vomiting. Jenne Pane, PA-C 10/26/2023 Morning Active Spouse/Significant Other, Pharmacy Records  OVER THE COUNTER MEDICATION [id number] No Swiss krisp for constipstion [provider] Past Week Active Spouse/Significant Other, Pharmacy Records  OVER THE COUNTER MEDICATION [id number] No Take 15 mLs by mouth as needed (constipation). duralax [provider] Past Week Active Spouse/Significant Other, Pharmacy Records  simethicone (MYLICON) 80 MG chewable tablet [id number]  Chew 1 tablet (80 mg total) by mouth every 6 (six) hours as needed for flatulence. Ozell Blunt, MD  Active   spironolactone (ALDACTONE) 25 MG tablet [id number] No TAKE 1 TABLET BY MOUTH EVERY DAY Darlis Eisenmenger, MD Past Week Active Spouse/Significant Other, Pharmacy Records  tadalafil (CIALIS) 10 MG tablet [id number] No Take 10 mg by mouth daily as needed for erectile dysfunction. [provider] Taking Active Spouse/Significant Other, Pharmacy Records  torsemide (DEMADEX) 20 MG tablet [id number] No Take 20 mg by mouth daily. [provider] Past Week Active Spouse/Significant Other, Pharmacy Records  XARELTO 20 MG TABS tablet [id number] No TAKE 1 TABLET BY MOUTH DAILY WITH SUPPER Darlis Eisenmenger, MD Past Week Active Spouse/Significant Other, Pharmacy Records            Home Care and Equipment/Supplies: Were Home Health Services Ordered?: NA Any new equipment or medical supplies ordered?: NA  Functional Questionnaire: Do you need assistance with bathing/showering or dressing?: No Do you need assistance with meal preparation?: No Do you need assistance with eating?: No Do you have difficulty maintaining continence: No Do you need assistance with getting out of bed/getting out of a chair/moving?: No Do you have difficulty managing or taking your medications?: No  Follow up appointments reviewed: PCP Follow-up appointment confirmed?: No (declined) MD Provider Line [phone] Given: No Specialist Hospital Follow-up appointment confirmed?: Yes Date of Specialist follow-up appointment?: 10/28/23 Follow-Up Specialty Provider:: ortho Do you need  transportation to your follow-up appointment?: No Do you understand care options if your condition(s) worsen?: Yes-patient verbalized understanding    SIGNATURE Darrall Ellison, LPN [facility] Nurse Health Advisor Direct Dial [phone]

## 2023-10-28 NOTE — Progress Notes (Signed)
 EPIC Encounter for ICM Monitoring  Patient Name: [name] is a 73 y.o. male Date: 10/28/2023 Primary Care Physican: Austine Lefort, MD Primary Cardiologist: Mitzie Anda Electrophysiologist: Kasandra Pain Pacing: >99%        12/02/2021 Office Weight: 267 lbs 12/10/2021 Office Weight: 276 lbs 12/17/2021 Weight:  Has not weighed since last OV 09/12/2022 Office Weight: 285 lbs 02/04/2023 Weight: 278 lbs 05/29/2023 Weight: 269 lbs 08/20/2023 Weight: 269 lbs                                                          AT/AF Burden: 100% (taking Xarelto)    Spoke with patient and heart failure questions reviewed.  Transmission results reviewed.  Pt reports he is not feeling well.  He is on the way to the Ortho doctor for f/u after knee replacement.  He thinks his BP is running high and will be checked at the doctors appt today.   Total Knee Replacement surgery on 4/1.  Hospitalization 4/14-4/15 for N/V and epigastric pain which correlates with possible dryness prior to hospitalization   CorVue thoracic impedance suggesting possible dryness starting 4/10.    Suggesting possible fluid accumulation from 3/18-3/27 and 3/31-4/9 (TKR 4/1).     Prescribed: Torsemide 20 mg take 1 tablet (20 mg total) daily.  He self adjusts Torsemide if needed.  Klor Con 20 mEq take take 1 tablet daily  Spironolactone 25 mg take 1 tablet daily   Labs: 10/27/2023 Creatinine 1.44, BUN 27, Potassium 3.3, Sodium 135, GFR 52  10/26/2023 Creatinine 1.48, BUN 29, Potassium 3.5, Sodium 136, GFR 51  07/27/2023 Creatinine 1.35, BUN 30, Potassium 4.8, Sodium 140, GFR 56 05/19/2023 Creatinine 1.42, BUN 29, Potassium 4.8, Sodium 140, GFR 53 04/13/2023 Creatinine 1.27, BUN 27, Potassium 5.0, Sodium 137 A complete set of results can be found in Results Review.   Recommendations:  Pt on the way to physicians office.  Will recheck fluid levels next week.   Follow-up plan: ICM clinic phone appointment on 11/02/2023 to recheck fluid levels.   91 day device clinic remote transmission 12/15/2023.     EP/Cardiology Office Visits:   02/08/2024 with HF clinic.  Recall 09/09/2023 with Dr Marven Slimmer or EP APP.    Copy of ICM check sent to Dr. Marven Slimmer.    3 month ICM trend: 10/26/2023.    12-14 Month ICM trend:     Almyra Jain, RN 10/28/2023 7:18 AM

## 2023-10-28 NOTE — Telephone Encounter (Signed)
 Chief Complaint: Elevated blood pressure  Symptoms: Systolic BP was 168 in Surgeon office today, abdominal discomfort Frequency: Constant  Pertinent Negatives: Patient denies vomiting, chest pain, fever Disposition: [] ED /[] Urgent Care (no appt availability in office) / [x] Appointment(In office/virtual)/ []  [facility]/ [] Home Care/ [] Refused Recommended Disposition /[] [facility]/ []  Follow-up with PCP Additional Notes: Patient's wife stated the patient was seen by his surgeon who did his knee replacement today and his systolic BP was elevated, patient's wife also reports the blood pressure was elevated while in the hospital and they gave him medication for it. Patient's reports the patient took Zepbound after his surgery because he did not know he was not suppose to take it and he had a reaction to it. Patient was admitted to the hospital on 10/26/23. Patient still reporting abdominal discomfort. Care advice was given and patient has already been scheduled for the first available appointment with PCP. Wife asking if there is any medication he can stop taking right now to help his body recover. She feels like he is taking a lot of medication.   1st attempt-call cannot be completed as dialed.  Copied from CRM [id number]. Topic: Clinical - Medication Question >> Oct 28, 2023 10:34 AM Crispin Dolphin wrote: Reason for CRM: Patient wife called with concerns about patient medication. Patient recently seen at hospital for stomach concerns they though was coming from restarting Zepbound after knee replacement.  Patient had, gas, vomit, other stomach concerns and tightness in chest. Was discharged yesterday from hospital and has an appt this afternoon with surgeon. Caller wants to know if provider can review hospital notes and list of medications and see if there is anything that patient can ease up on until his body has time to heal. Patient also wnted to let provider know that the list she was  given at hospital of medication did not include Xarelto and she thinks he should still take that one. Would like a call back. Thank You Reason for Disposition  Systolic BP  >= 160 OR Diastolic >= 100  Answer Assessment - Initial Assessment Questions 1. BLOOD PRESSURE: "What is the blood pressure?" "Did you take at least two measurements 5 minutes apart?"     168/xx 2. ONSET: "When did you take your blood pressure?"     Today 3. HOW: "How did you take your blood pressure?" (e.g., automatic home BP monitor, visiting nurse)     Surgeon  4. HISTORY: "Do you have a history of high blood pressure?"     No  5. MEDICINES: "Are you taking any medicines for blood pressure?" "Have you missed any doses recently?"     No  6. OTHER SYMPTOMS: "Do you have any symptoms?" (e.g., blurred vision, chest pain, difficulty breathing, headache, weakness)     Gas, abdominal discomfort  7. PREGNANCY: "Is there any chance you are pregnant?" "When was your last menstrual period?"     N/A  Protocols used: Blood Pressure - High-A-AH

## 2023-10-28 NOTE — Telephone Encounter (Signed)
 3rd attempt-LVM, routing to clinic

## 2023-10-28 NOTE — Telephone Encounter (Signed)
 1st attempt-call cannot be completed as dialed.  Copied from CRM [id number]. Topic: Clinical - Medication Question >> Oct 28, 2023 10:34 AM Crispin Dolphin wrote: Reason for CRM: Patient wife called with concerns about patient medication. Patient recently seen at hospital for stomach concerns they though was coming from restarting Zepbound after knee replacement.  Patient had, gas, vomit, other stomach concerns and tightness in chest. Was discharged yesterday from hospital and has an appt this afternoon with surgeon. Caller wants to know if provider can review hospital notes and list of medications and see if there is anything that patient can ease up on until his body has time to heal. Patient also wnted to let provider know that the list she was given at hospital of medication did not include Xarelto and she thinks he should still take that one. Would like a call back. Thank You

## 2023-10-29 ENCOUNTER — Ambulatory Visit (INDEPENDENT_AMBULATORY_CARE_PROVIDER_SITE_OTHER)

## 2023-10-29 VITALS — BP 122/82 | Ht 70.0 in | Wt 254.0 lb

## 2023-10-29 DIAGNOSIS — Z0000 Encounter for general adult medical examination without abnormal findings: Secondary | ICD-10-CM | POA: Diagnosis not present

## 2023-10-29 NOTE — Progress Notes (Signed)
 Subjective:   [name] is a 73 y.o. male who presents for Medicare Annual/Subsequent preventive examination.  Visit Complete: Virtual I connected with  [name] on 10/29/23 by a audio enabled telemedicine application and verified that I am speaking with the correct person using two identifiers.  Patient Location: Home  Provider Location: Home Office  I discussed the limitations of evaluation and management by telemedicine. The patient expressed understanding and agreed to proceed.  Vital Signs: Because this visit was a virtual/telehealth visit, some criteria may be missing or patient reported. Any vitals not documented were not able to be obtained and vitals that have been documented are patient reported.  Patient Medicare AWV questionnaire was completed by the patient on 10/29/2023; I have confirmed that all information answered by patient is correct and no changes since this date.  Cardiac Risk Factors include: advanced age (>44men, >24 women);dyslipidemia;hypertension;male gender;obesity (BMI >30kg/m2)     Objective:    Today's Vitals   10/29/23 1058  BP: 122/82  Weight: 254 lb (115.2 kg)  Height: 5\' 10"  (1.778 m)   Body mass index is 36.45 kg/m.     10/29/2023   11:48 AM 10/27/2023    2:12 AM 10/26/2023    8:58 AM 10/02/2023    1:27 PM 12/20/2021   11:23 AM 05/29/2020    5:50 AM 05/18/2020   10:02 AM  Advanced Directives  Does Patient Have a Medical Advance Directive? Yes Yes Yes Yes Yes No No  Type of Estate agent of [place];Living will Healthcare Power of [place];Living will Living will;Healthcare Power of [employer] Power of [place];Living will Healthcare Power of [place];Living will    Does patient want to make changes to medical advance directive?  No - Patient declined  No - Patient declined     Copy of Healthcare Power of Attorney in Chart? No - copy requested No - copy requested   No - copy requested    Would patient  like information on creating a medical advance directive?      No - Patient declined     Current Medications (verified) Outpatient Encounter Medications as of 10/29/2023  Medication Sig   acetaminophen (TYLENOL) 500 MG tablet Take 1 tablet (500 mg total) by mouth every 6 (six) hours as needed for mild pain (pain score 1-3) or moderate pain (pain score 4-6).   carvedilol (COREG) 6.25 MG tablet TAKE 1 TABLET (6.25 MG TOTAL) BY MOUTH 2 (TWO) TIMES DAILY WITH A MEAL. NEEDS FOLLOW UP APPOINTMENT   colchicine 0.6 MG tablet Take 1 tablet (0.6 mg total) by mouth daily.   dapagliflozin propanediol (FARXIGA) 10 MG TABS tablet Take 1 tablet (10 mg total) by mouth daily before breakfast.   diclofenac Sodium (VOLTAREN) 1 % GEL Apply 2 g topically daily as needed (pain).    ENTRESTO 24-26 MG TAKE 1 TABLET BY MOUTH TWICE A DAY   famotidine (PEPCID) 20 MG tablet Take 1 tablet (20 mg total) by mouth daily as needed for heartburn or indigestion.   gabapentin (NEURONTIN) 300 MG capsule Take 300 mg by mouth daily.   KLOR-CON M20 20 MEQ tablet TAKE 1 TABLET BY MOUTH EVERY DAY   MAGNESIUM PO Take 1 tablet by mouth daily.   methocarbamol (ROBAXIN-750) 750 MG tablet Take 1 tablet (750 mg total) by mouth every 8 (eight) hours as needed for muscle spasms. MAY CAUSE DROWSINESS!   Multiple Vitamins-Minerals (MULTIVITAMIN GUMMIES ADULT PO) Take 1 tablet by mouth daily.  NON FORMULARY Pt uses a cpap nightly   ondansetron (ZOFRAN-ODT) 4 MG disintegrating tablet Take 1 tablet (4 mg total) by mouth every 8 (eight) hours as needed for nausea or vomiting.   OVER THE COUNTER MEDICATION Swiss krisp for constipstion   OVER THE COUNTER MEDICATION Take 15 mLs by mouth as needed (constipation). duralax   simethicone (MYLICON) 80 MG chewable tablet Chew 1 tablet (80 mg total) by mouth every 6 (six) hours as needed for flatulence.   spironolactone (ALDACTONE) 25 MG tablet TAKE 1 TABLET BY MOUTH EVERY DAY   tadalafil (CIALIS) 10 MG  tablet Take 10 mg by mouth daily as needed for erectile dysfunction.   torsemide (DEMADEX) 20 MG tablet Take 20 mg by mouth daily.   XARELTO 20 MG TABS tablet TAKE 1 TABLET BY MOUTH DAILY WITH SUPPER   No facility-administered encounter medications on file as of 10/29/2023.    Allergies (verified) Metoprolol tartrate and Oxycodone   History: Past Medical History:  Diagnosis Date   AICD (automatic cardioverter/defibrillator) present    Aortic insufficiency 04/21/2018   Aortic insufficiency    Arthritis    "joints" (09/26/2013)   Atrial enlargement, left    CHF (congestive heart failure) (HCC)    Dysrhythmia    A. fib   Essential hypertension 11/28/2020   Hearing aid worn    both ears   Mitral regurgitation 04/21/2018   Neuromuscular disorder (HCC)    Neuropathy in both feet   Obesity    OSA on CPAP    "mask adjusts to what I need" (09/26/2013)   Persistent atrial fibrillation (HCC)    cardioversion 06/06/13   PONV (postoperative nausea and vomiting)    Rotator cuff tear, right 06/2011   physical therapy done, decreased strength   S/P aortic valve repair 05/18/2018   Complex valvuloplasty including plication of right coronary leaflet and 21 mm Biostable HAART annuloplasty ring   S/P Maze operation for atrial fibrillation 05/18/2018   Complete bilateral atrial lesion set using bipolar radiofrequency and cryothermy ablation with clipping of LA appendage   S/P mitral valve repair 05/18/2018   Complex valvuloplasty including artificial Gore-tex neochord placement x4 and 30 mm Sorin Memo 4D ring annuloplasty   Wears glasses    Past Surgical History:  Procedure Laterality Date   AORTIC VALVE REPAIR N/A 05/18/2018   Procedure: AORTIC VALVE REPAIR using HAART 300 Aortic Annuloplasty Device size 21mm;  Surgeon: Gardenia Jump, MD;  Location: [facility] OR;  Service: Open Heart Surgery;  Laterality: N/A;   ATRIAL FIBRILLATION ABLATION N/A 04/26/2019   Procedure: ATRIAL FIBRILLATION  ABLATION;  Surgeon: Jolly Needle, MD;  Location: [facility] INVASIVE CV LAB;  Service: Cardiovascular;  Laterality: N/A;   AV NODE ABLATION N/A 12/20/2019   Procedure: AV NODE ABLATION;  Surgeon: Jolly Needle, MD;  Location: [facility];  Service: Cardiovascular;  Laterality: N/A;   BIV ICD INSERTION CRT-D N/A 08/23/2019   Procedure: BIV ICD INSERTION CRT-D;  Surgeon: Jolly Needle, MD;  [facility]: [facility];  Service: Cardiovascular;  Laterality: N/A;   CARDIOVERSION N/A 06/06/2013   Procedure: CARDIOVERSION;  Surgeon: Luana Rumple, MD;  Location: [facility] ENDOSCOPY;  Service: Cardiovascular;  Laterality: N/A;   CARDIOVERSION N/A 09/28/2013   Procedure: CARDIOVERSION BEDSIDE;  Surgeon: Peter M Swaziland, MD;  Location: [facility] OR;  Service: Cardiovascular;  Laterality: N/A;   CARDIOVERSION N/A 11/30/2018   Procedure: CARDIOVERSION;  Surgeon: Liza Riggers, MD;  Location: [facility] ENDOSCOPY;  Service: Cardiovascular;  Laterality: N/A;  CARDIOVERSION N/A 03/10/2019   Procedure: CARDIOVERSION;  Surgeon: Wendie Hamburg, MD;  Location: [facility] ENDOSCOPY;  Service: Endoscopy;  Laterality: N/A;   CARDIOVERSION N/A 12/20/2021   Procedure: CARDIOVERSION;  Surgeon: Darlis Eisenmenger, MD;  Location: [facility] ENDOSCOPY;  Service: Cardiovascular;  Laterality: N/A;   CARPAL TUNNEL RELEASE Right 08/08/2013   Procedure: RIGHT CARPAL TUNNEL RELEASE;  Surgeon: Kemp Patter, MD;  Location: [facility];  Service: Orthopedics;  Laterality: Right;   CARPAL TUNNEL RELEASE Left 07/14/2006   CLIPPING OF ATRIAL APPENDAGE  05/18/2018   Procedure: CLIPPING OF ATRIAL APPENDAGE using AtriCure Clip size 45;  Surgeon: Gardenia Jump, MD;  Location[facility] OR;  Service: Open Heart Surgery;;   KNEE ARTHROSCOPY Right 03/14/1989   MAZE N/A 05/18/2018   Procedure: MAZE;  Surgeon: Gardenia Jump, MD;  Location: [facility] OR;  Service: Open Heart Surgery;  Laterality: N/A;   MITRAL VALVE REPAIR N/A 05/18/2018   Procedure: MITRAL  VALVE REPAIR (MVR) using 4D Memo Ring size 30;  Surgeon: Gardenia Jump, MD;  Location: [facility] OR;  Service: Open Heart Surgery;  Laterality: N/A;   PACEMAKER INSERTION     RIGHT/LEFT HEART CATH AND CORONARY ANGIOGRAPHY N/A 04/21/2018   Procedure: RIGHT/LEFT HEART CATH AND CORONARY ANGIOGRAPHY;  Surgeon: Sammy Crisp, MD;  [facility] INVAS[facility] CV LAB;  Service: Cardiovascular;  Laterality: N/A;   SHOULDER OPEN ROTATOR CUFF REPAIR Left 03/14/1989   SHOULDER OPEN ROTATOR CUFF REPAIR Right 11/12/2007   TEE WITHOUT CARDIOVERSION N/A 04/21/2018   Procedure: TRANSESOPHAGEAL ECHOCARDIOGRAM (TEE);  Surgeon: Hugh Madura, MD;  Location: [facility] ENDOSCOPY;  Service: Cardiovascular;  Laterality: N/A;   TEE WITHOUT CARDIOVERSION N/A 05/18/2018   Procedure: TRANSESOPHAGEAL ECHOCARDIOGRAM (TEE);  Surgeon: Gardenia Jump, MD;  Location: [facility] OR;  Service: Open Heart Surgery;  Laterality: N/A;   TONSILLECTOMY  1950's   TOTAL HIP ARTHROPLASTY Left 07/14/2008   TOTAL HIP ARTHROPLASTY Right 05/29/2020   Procedure: TOTAL HIP ARTHROPLASTY ANTERIOR APPROACH;  Surgeon: Saundra Curl, MD;  Location: [facility] ORS;  Service: Orthopedics;  Laterality: Right;   TOTAL HIP REVISION Left 10/08/2011   TOTAL HIP REVISION  04/09/2012   Procedure: TOTAL HIP REVISION;  Surgeon: Aurther Blue, MD;  Location: [facility] ORS;  Service: Orthopedics;  Laterality: Left;  Left Hip Acetabular Revision vs Constrained Liner   TOTAL KNEE ARTHROPLASTY Right 07/14/2004   TOTAL KNEE ARTHROPLASTY Left 10/13/2023   Procedure: ARTHROPLASTY, KNEE, TOTAL;  Surgeon: Saundra Curl, MD;  Location: [facility] ORS;  Service: Orthopedics;  Laterality: Left;   TRIGGER FINGER RELEASE Right 08/08/2013   Procedure: RELEASE STENOSING TENOSYNOVITIS RIGHT THUMB;  Surgeon: Kemp Patter, MD;  Location: [facility];  Service: Orthopedics;  Laterality: Right;   Family History  Problem Relation Age of Onset   Heart Problems Mother    Arthritis Mother    Cancer  Sister    Other Sister        bilateral hip replacements   Healthy Daughter    Healthy Son    Healthy Son    Other Other        mother had a pacemaker   Social History   Socioeconomic History   Marital status: Married    Spouse name: Not on file   Number of children: Not on file   Years of education: Not on file   Highest education level: Not on file  Occupational History   Occupation: Public affairs consultant  Tobacco Use  Smoking status: Never    Passive exposure: Never   Smokeless tobacco: Never  Vaping Use   Vaping status: Never Used  Substance and Sexual Activity   Alcohol use: No   Drug use: No   Sexual activity: Yes  Other Topics Concern   Not on file  Social History Narrative   Pt lives in [place] with spouse.  Leadership training and behavioral safety training.   Social Drivers of Health   Financial Resource Strain: Medium Risk (10/29/2023)   Overall Financial Resource Strain (CARDIA)    Difficulty of Paying Living Expenses: Somewhat hard  Food Insecurity: No Food Insecurity (10/29/2023)   Hunger Vital Sign    Worried About Running Out of Food in the Last Year: Never true    Ran Out of Food in the Last Year: Never true  Transportation Needs: No Transportation Needs (10/29/2023)   PRAPARE - Administrator, Civil Service (Medical): No    Lack of Transportation (Non-Medical): No  Physical Activity: Inactive (10/29/2023)   Exercise Vital Sign    Days of Exercise per Week: 0 days    Minutes of Exercise per Session: 0 min  Stress: Stress Concern Present (10/29/2023)   [employer] of Occupational Health - Occupational Stress Questionnaire    Feeling of Stress : To some extent  Social Connections: Unknown (10/29/2023)   Social Connection and Isolation Panel [NHANES]    Frequency of Communication with Friends and Family: More than three times a week    Frequency of Social Gatherings with Friends and Family: More than three times a  week    Attends Religious Services: Patient unable to answer    Active Member of Clubs or Organizations: Yes    Attends Engineer, structural: More than 4 times per year    Marital Status: Married    Tobacco Counseling Counseling given: Not Answered   Clinical Intake:  Pre-visit preparation completed: Yes  Pain : No/denies pain     Diabetes: No  How often do you need to have someone help you when you read instructions, pamphlets, or other written materials from your doctor or pharmacy?: 1 - Never What is the last grade level you completed in school?: 12th grade  Interpreter Needed?: No      Activities of Daily Living    10/29/2023   11:47 AM 10/27/2023    2:17 AM  In your present state of health, do you have any difficulty performing the following activities:  Hearing? 1   Vision? 0   Difficulty concentrating or making decisions? 0   Walking or climbing stairs? 1   Dressing or bathing? 1   Doing errands, shopping? 0 0  Preparing Food and eating ? N   Using the Toilet? N   In the past six months, have you accidently leaked urine? N   Do you have problems with loss of bowel control? N   Managing your Medications? N   Managing your Finances? N   Housekeeping or managing your Housekeeping? N     Patient Care Team: Donita Brooks, MD as PCP - General (Family Medicine) Runell Gess, MD as PCP - Cardiology (Cardiology) Lanier Prude, MD as PCP - Electrophysiology (Cardiology) Hillis Range, MD (Inactive) as Consulting Physician (Cardiology) Jene Every, MD as Consulting Physician (Orthopedic Surgery)  Indicate any recent Medical Services you may have received from other than [facility] providers in the past year (date may be approximate).     Assessment:  This is a routine wellness examination for [name].  Hearing/Vision screen No results found.   Goals Addressed             This Visit's Progress    other       To get better from  knee surgery      Depression Screen    10/29/2023   11:02 AM 09/27/2019    9:02 AM 05/31/2018    2:45 PM  PHQ 2/9 Scores  PHQ - 2 Score 0 0 0    Fall Risk    10/29/2023   11:48 AM 05/31/2018    2:45 PM  Fall Risk   Falls in the past year? 0 0  Number falls in past yr: 0   Injury with Fall? 0   Risk for fall due to : No Fall Risks   Follow up Falls evaluation completed     MEDICARE RISK AT HOME: Medicare Risk at Home Any stairs in or around the home?: Yes If so, are there any without handrails?: No Home free of loose throw rugs in walkways, pet beds, electrical cords, etc?: Yes Adequate lighting in your home to reduce risk of falls?: Yes Life alert?: No Use of a cane, walker or w/c?: No Grab bars in the bathroom?: Yes Shower chair or bench in shower?: No Elevated toilet seat or a handicapped toilet?: No  TIMED UP AND GO:  Was the test performed?  No    Cognitive Function:        10/29/2023   11:49 AM  6CIT Screen  What Year? 0 points  What month? 0 points  What time? 0 points  Count back from 20 0 points  Months in reverse 0 points  Repeat phrase 0 points  Total Score 0 points    Immunizations Immunization History  Administered Date(s) Administered   Influenza Split 04/13/2009, 05/03/2013, 07/17/2015   Influenza,inj,Quad PF,6+ Mos 05/02/2010, 04/22/2012, 08/13/2017   Influenza,inj,quad, With Preservative 05/15/2014   Influenza-Unspecified 04/13/2013   Td 04/13/2004   Tdap 05/15/2014    TDAP status: Up to date  Flu Vaccine status: Declined, Education has been provided regarding the importance of this vaccine but patient still declined. Advised may receive this vaccine at local pharmacy or Health Dept. Aware to provide a copy of the vaccination record if obtained from local pharmacy or Health Dept. Verbalized acceptance and understanding.  Pneumococcal vaccine status: Declined,  Education has been provided regarding the importance of this vaccine but  patient still declined. Advised may receive this vaccine at local pharmacy or Health Dept. Aware to provide a copy of the vaccination record if obtained from local pharmacy or Health Dept. Verbalized acceptance and understanding.   Covid-19 vaccine status: Information provided on how to obtain vaccines.   Qualifies for Shingles Vaccine? Yes   Zostavax completed No   Shingrix Completed?: No.    Education has been provided regarding the importance of this vaccine. Patient has been advised to call insurance company to determine out of pocket expense if they have not yet received this vaccine. Advised may also receive vaccine at local pharmacy or Health Dept. Verbalized acceptance and understanding.  Screening Tests Health Maintenance  Topic Date Due   COVID-19 Vaccine (1) Never done   Hepatitis C Screening  Never done   Pneumonia Vaccine 35+ Years old (1 of 2 - PCV) Never done   Zoster Vaccines- Shingrix (1 of 2) Never done   INFLUENZA VACCINE  02/12/2024   DTaP/Tdap/Td (3 - Td  or Tdap) 05/15/2024   Medicare Annual Wellness (AWV)  10/28/2024   Colonoscopy  07/29/2033   HPV VACCINES  Aged Out   Meningococcal B Vaccine  Aged Out    Health Maintenance  Health Maintenance Due  Topic Date Due   COVID-19 Vaccine (1) Never done   Hepatitis C Screening  Never done   Pneumonia Vaccine 68+ Years old (1 of 2 - PCV) Never done   Zoster Vaccines- Shingrix (1 of 2) Never done    Colorectal cancer screening: Type of screening: Colonoscopy. Completed 07/30/2023. Repeat every 10 years  Lung Cancer Screening: (Low Dose CT Chest recommended if Age 71-80 years, 20 pack-year currently smoking OR have quit w/in 15years.) does not qualify.   Lung Cancer Screening Referral:   Additional Screening:  Hepatitis C Screening: does qualify; Completed no  Vision Screening: Recommended annual ophthalmology exams for early detection of glaucoma and other disorders of the eye. Is the patient up to date with  their annual eye exam?  Yes  Who is the provider or what is the name of the office in which the patient attends annual eye exams? [facility] If pt is not established with a provider, would they like to be referred to a provider to establish care? No .   Dental Screening: Recommended annual dental exams for proper oral hygiene  Diabetic Foot Exam:   Community Resource Referral / Chronic Care Management: CRR required this visit?  No   CCM required this visit?  No     Plan:     I have personally reviewed and noted the following in the patient's chart:   Medical and social history Use of alcohol, tobacco or illicit drugs  Current medications and supplements including opioid prescriptions. Patient is not currently taking opioid prescriptions. Functional ability and status Nutritional status Physical activity Advanced directives List of other physicians Hospitalizations, surgeries, and ER visits in previous 12 months Vitals Screenings to include cognitive, depression, and falls Referrals and appointments  In addition, I have reviewed and discussed with patient certain preventive protocols, quality metrics, and best practice recommendations. A written personalized care plan for preventive services as well as general preventive health recommendations were provided to patient.     Alvina Axon   10/29/2023   After Visit Summary: (MyChart) Due to this being a telephonic visit, the after visit summary with patients personalized plan was offered to patient via MyChart   Nurse Notes:   [name] , Thank you for taking time to come for your Medicare Wellness Visit. I appreciate your ongoing commitment to your health goals. Please review the following plan we discussed and let me know if I can assist you in the future.   These are the goals we discussed:  Goals      other     To get better from knee surgery        This is a list of the screening recommended for you and due  dates:  Health Maintenance  Topic Date Due   COVID-19 Vaccine (1) Never done   Hepatitis C Screening  Never done   Pneumonia Vaccine (1 of 2 - PCV) Never done   Zoster (Shingles) Vaccine (1 of 2) Never done   Flu Shot  02/12/2024   DTaP/Tdap/Td vaccine (3 - Td or Tdap) 05/15/2024   Medicare Annual Wellness Visit  10/28/2024   Colon Cancer Screening  07/29/2033   HPV Vaccine  Aged Out   Meningitis B Vaccine  Aged Out

## 2023-10-29 NOTE — Patient Instructions (Signed)
  [name] , Thank you for taking time to come for your Medicare Wellness Visit. I appreciate your ongoing commitment to your health goals. Please review the following plan we discussed and let me know if I can assist you in the future.   These are the goals we discussed:  Goals      other     To get better from knee surgery        This is a list of the screening recommended for you and due dates:  Health Maintenance  Topic Date Due   COVID-19 Vaccine (1) Never done   Hepatitis C Screening  Never done   Pneumonia Vaccine (1 of 2 - PCV) Never done   Zoster (Shingles) Vaccine (1 of 2) Never done   Flu Shot  02/12/2024   DTaP/Tdap/Td vaccine (3 - Td or Tdap) 05/15/2024   Medicare Annual Wellness Visit  10/28/2024   Colon Cancer Screening  07/29/2033   HPV Vaccine  Aged Out   Meningitis B Vaccine  Aged Out

## 2023-11-02 ENCOUNTER — Ambulatory Visit: Admitting: Family Medicine

## 2023-11-02 ENCOUNTER — Ambulatory Visit: Attending: Cardiology

## 2023-11-02 ENCOUNTER — Ambulatory Visit: Payer: Self-pay | Admitting: Family Medicine

## 2023-11-02 VITALS — BP 112/60 | HR 63 | Temp 98.3°F | Ht 70.0 in | Wt 252.2 lb

## 2023-11-02 DIAGNOSIS — I5022 Chronic systolic (congestive) heart failure: Secondary | ICD-10-CM

## 2023-11-02 DIAGNOSIS — I509 Heart failure, unspecified: Secondary | ICD-10-CM

## 2023-11-02 DIAGNOSIS — I4819 Other persistent atrial fibrillation: Secondary | ICD-10-CM

## 2023-11-02 DIAGNOSIS — Z95810 Presence of automatic (implantable) cardiac defibrillator: Secondary | ICD-10-CM

## 2023-11-02 DIAGNOSIS — M1712 Unilateral primary osteoarthritis, left knee: Secondary | ICD-10-CM | POA: Diagnosis not present

## 2023-11-02 DIAGNOSIS — M6281 Muscle weakness (generalized): Secondary | ICD-10-CM | POA: Diagnosis not present

## 2023-11-02 DIAGNOSIS — M25662 Stiffness of left knee, not elsewhere classified: Secondary | ICD-10-CM | POA: Diagnosis not present

## 2023-11-02 DIAGNOSIS — R262 Difficulty in walking, not elsewhere classified: Secondary | ICD-10-CM | POA: Diagnosis not present

## 2023-11-02 LAB — CBC WITH DIFFERENTIAL/PLATELET
Absolute Lymphocytes: 2050 {cells}/uL (ref 850–3900)
Absolute Monocytes: 1000 {cells}/uL — ABNORMAL HIGH (ref 200–950)
Basophils Absolute: 42 {cells}/uL (ref 0–200)
Basophils Relative: 0.5 %
Eosinophils Absolute: 260 {cells}/uL (ref 15–500)
Eosinophils Relative: 3.1 %
HCT: 42.7 % (ref 38.5–50.0)
Hemoglobin: 13.9 g/dL (ref 13.2–17.1)
MCH: 28.1 pg (ref 27.0–33.0)
MCHC: 32.6 g/dL (ref 32.0–36.0)
MCV: 86.4 fL (ref 80.0–100.0)
MPV: 11 fL (ref 7.5–12.5)
Monocytes Relative: 11.9 %
Neutro Abs: 5048 {cells}/uL (ref 1500–7800)
Neutrophils Relative %: 60.1 %
Platelets: 268 10*3/uL (ref 140–400)
RBC: 4.94 10*6/uL (ref 4.20–5.80)
RDW: 13.9 % (ref 11.0–15.0)
Total Lymphocyte: 24.4 %
WBC: 8.4 10*3/uL (ref 3.8–10.8)

## 2023-11-02 LAB — COMPLETE METABOLIC PANEL WITHOUT GFR
AG Ratio: 2 (calc) (ref 1.0–2.5)
ALT: 13 U/L (ref 9–46)
AST: 13 U/L (ref 10–35)
Albumin: 4.2 g/dL (ref 3.6–5.1)
Alkaline phosphatase (APISO): 65 U/L (ref 35–144)
BUN/Creatinine Ratio: 21 (calc) (ref 6–22)
BUN: 26 mg/dL — ABNORMAL HIGH (ref 7–25)
CO2: 28 mmol/L (ref 20–32)
Calcium: 9.7 mg/dL (ref 8.6–10.3)
Chloride: 103 mmol/L (ref 98–110)
Creat: 1.24 mg/dL (ref 0.70–1.28)
Globulin: 2.1 g/dL (ref 1.9–3.7)
Glucose, Bld: 108 mg/dL — ABNORMAL HIGH (ref 65–99)
Potassium: 5.2 mmol/L (ref 3.5–5.3)
Sodium: 139 mmol/L (ref 135–146)
Total Bilirubin: 0.8 mg/dL (ref 0.2–1.2)
Total Protein: 6.3 g/dL (ref 6.1–8.1)

## 2023-11-02 MED ORDER — HYDROCODONE-ACETAMINOPHEN 5-325 MG PO TABS: 1.0000 | ORAL_TABLET | Freq: Four times a day (QID) | ORAL | 0 refills | Status: DC | PRN

## 2023-11-02 NOTE — Progress Notes (Signed)
 Subjective:    Patient ID: [name], male    DOB: [birth year], 73 y.o.   MRN: [id number] Admit date:     10/26/2023  Discharge date: 10/27/23  Discharge Physician: Ozell Blunt    PCP: Austine Lefort, MD    Recommendations at discharge:    Follow-up PCP in 1 week   Discharge Diagnoses: Principal Problem:   Abdominal pain   Resolved Problems:   * No resolved hospital problems. *   Hospital Course: 73 y.o. male with medical history significant of mitral valve replacement, paroxysmal A-fib who presented with epigastric pain nausea vomiting.  Patient recent knee replacement and subsequently developed nausea and vomiting.  He has been unable to tolerate much food.  He presented to the ER where he was found to be afebrile and hemodynamically stable      Assessment and Plan:   Epigastric abdominal pain/nausea/vomiting - Patient's symptoms improved with Pepcid  - Etiology of patient's symptoms likely related to gastritis or GERD in the setting of taking Zepbound  -Continue Pepcid , simethicone  as needed for next few days -Stop Zepbound        # Hypertensive urgency -Likely in setting of vomiting, blood pressure is significantly improved Continue home medications including Entresto    # Elevated troponin - Patient found to have hypertension in setting of elevated troponins -Blood pressure has improved - Will continue Entresto , carvedilol , torsemide  -Cardiology consulted, had recent echo.  No need for echo in the hospital -Cardiology has cleared patient for discharge, continue home medications   Acute kidney injury -Creatinine 1.48, likely in setting of diuresis Creatinine 1.44 today.   Hypokalemia -Potassium is 3.3 -Will give 1 dose of KCl 40 mEq p.o. before discharge       #Hx of CHF, preserved EF, not in exacerbation- Bnp elevated. Will continue home meds.     #Recent knee surgery- VQ negative for PE. Continue xarelto    #Permanent AFIB- cotninue xarelto .      11/02/23  Patient was recently admitted for a knee replacement.  He helps that now prior to surgery and resume medication after surgery.  He then developed severe gas, abdominal distention, chest pressure, and pain.  This prompted hypertensive urgency and he went to the emergency room.  He was admitted.  Troponins were mildly elevated.  Cardiology was consulted.  However symptoms were deemed to be secondary to Zepbound .  The patient is holding the medication chest pain resolved.  Denies going for nausea or vomiting.  He does report trouble sleeping due to pain in his left knee.  He is hesitant to take any pain medication however because of his recent abdominal distention.  Blood pressure today is excellent and I verified this personally. Past Medical History:  Diagnosis Date   AICD (automatic cardioverter/defibrillator) present    Aortic insufficiency 04/21/2018   Aortic insufficiency    Arthritis    "joints" (09/26/2013)   Atrial enlargement, left    CHF (congestive heart failure) (HCC)    Dysrhythmia    A. fib   Essential hypertension 11/28/2020   Hearing aid worn    both ears   Mitral regurgitation 04/21/2018   Neuromuscular disorder (HCC)    Neuropathy in both feet   Obesity    OSA on CPAP    "mask adjusts to what I need" (09/26/2013)   Persistent atrial fibrillation (HCC)    cardioversion 06/06/13   PONV (postoperative nausea and vomiting)    Rotator cuff tear, right 06/2011   physical therapy done,  decreased strength   S/P aortic valve repair 05/18/2018   Complex valvuloplasty including plication of right coronary leaflet and 21 mm Biostable HAART annuloplasty ring   S/P Maze operation for atrial fibrillation 05/18/2018   Complete bilateral atrial lesion set using bipolar radiofrequency and cryothermy ablation with clipping of LA appendage   S/P mitral valve repair 05/18/2018   Complex valvuloplasty including artificial Gore-tex neochord placement x4 and 30 mm Sorin Memo 4D ring  annuloplasty   Wears glasses    Past Surgical History:  Procedure Laterality Date   AORTIC VALVE REPAIR N/A 05/18/2018   Procedure: AORTIC VALVE REPAIR using HAART 300 Aortic Annuloplasty Device size 21mm;  Surgeon: Gardenia Jump, MD;  [facility]: [facility];  Service: Open Heart Surgery;  Laterality: N/A;   ATRIAL FIBRILLATION ABLATION N/A 04/26/2019   Procedure: ATRIAL FIBRILLATION ABLATION;  Surgeon: Jolly Needle, MD;  Location: [facility];  Service: Cardiovascular;  Laterality: N/A;   AV NODE ABLATION N/A 12/20/2019   Procedure: AV NODE ABLATION;  Surgeon: Jolly Needle, MD;  [facility]: [facility];  Service: Cardiovascular;  Laterality: N/A;   BIV ICD INSERTION CRT-D N/A 08/23/2019   Procedure: BIV ICD INSERTION CRT-D;  Surgeon: Jolly Needle, MD;  [facility];  Service: Cardiovascular;  Laterality: N/A;   CARDIOVERSION N/A 06/06/2013   Procedure: CARDIOVERSION;  Surgeon: Luana Rumple, MD;  Location: [facility];  Service: Cardiovascular;  Laterality: N/A;   CARDIOVERSION N/A 09/28/2013   Procedure: CARDIOVERSION BEDSIDE;  Surgeon: Peter M Swaziland, MD;  Location: [facility] OR;  Service: Cardiovascular;  Laterality: N/A;   CARDIOVERSION N/A 11/30/2018   Procedure: CARDIOVERSION;  Surgeon: Liza Riggers, MD;  Location: [facility] ENDOSCOPY;  Service: Cardiovascular;  Laterality: N/A;   CARDIOVERSION N/A 03/10/2019   Procedure: CARDIOVERSION;  Surgeon: Wendie Hamburg, MD;  Location: [facility] ENDOSCOPY;  Service: Endoscopy;  Laterality: N/A;   CARDIOVERSION N/A 12/20/2021   Procedure: CARDIOVERSION;  Surgeon: Darlis Eisenmenger, MD;  Location: [facility] ENDOSCOPY;  Service: Cardiovascular;  Laterality: N/A;   CARPAL TUNNEL RELEASE Right 08/08/2013   Procedure: RIGHT CARPAL TUNNEL RELEASE;  Surgeon: Kemp Patter, MD;  Location: [facility];  Service: Orthopedics;  Laterality: Right;   CARPAL TUNNEL RELEASE Left 07/14/2006   CLIPPING OF ATRIAL APPENDAGE   05/18/2018   Procedure: CLIPPING OF ATRIAL APPENDAGE using AtriCure Clip size 45;  Surgeon: Gardenia Jump, MD;  Location: [facility] OR;  Service: Open Heart Surgery;;   KNEE ARTHROSCOPY Right 03/14/1989   MAZE N/A 05/18/2018   Procedure: MAZE;  Surgeon: Gardenia Jump, MD;  Location: [facility] OR;  Service: Open Heart Surgery;  Laterality: N/A;   MITRAL VALVE REPAIR N/A 05/18/2018   Procedure: MITRAL VALVE REPAIR (MVR) using 4D Memo Ring size 30;  Surgeon: Gardenia Jump, MD;  Location: [facility] OR;  Service: Open Heart Surgery;  Laterality: N/A;   PACEMAKER INSERTION     RIGHT/LEFT HEART CATH AND CORONARY ANGIOGRAPHY N/A 04/21/2018   Procedure: RIGHT/LEFT HEART CATH AND CORONARY ANGIOGRAPHY;  Surgeon: Sammy Crisp, MD;  [facility];  Service: Cardiovascular;  Laterality: N/A;   SHOULDER OPEN ROTATOR CUFF REPAIR Left 03/14/1989   SHOULDER OPEN ROTATOR CUFF REPAIR Right 11/12/2007   TEE WITHOUT CARDIOVERSION N/A 04/21/2018   Procedure: TRANSESOPHAGEAL ECHOCARDIOGRAM (TEE);  Surgeon: Hugh Madura, MD;  Location: [facility] ENDOSCOPY;  Service: Cardiovascular;  Laterality: N/A;   TEE WITHOUT CARDIOVERSION N/A 05/18/2018   Procedure: TRANSESOPHAGEAL ECHOCARDIOGRAM (TEE);  Surgeon: Alva Jewels,  Mendel Stain, MD;  Location: [facility] OR;  Service: Open Heart Surgery;  Laterality: N/A;   TONSILLECTOMY  1950's   TOTAL HIP ARTHROPLASTY Left 07/14/2008   TOTAL HIP ARTHROPLASTY Right 05/29/2020   Procedure: TOTAL HIP ARTHROPLASTY ANTERIOR APPROACH;  Surgeon: Saundra Curl, MD;  Location: [facility] ORS;  Service: Orthopedics;  Laterality: Right;   TOTAL HIP REVISION Left 10/08/2011   TOTAL HIP REVISION  04/09/2012   Procedure: TOTAL HIP REVISION;  Surgeon: Aurther Blue, MD;  Location: [facility] ORS;  Service: Orthopedics;  Laterality: Left;  Left Hip Acetabular Revision vs Constrained Liner   TOTAL KNEE ARTHROPLASTY Right 07/14/2004   TOTAL KNEE ARTHROPLASTY Left 10/13/2023   Procedure: ARTHROPLASTY, KNEE, TOTAL;  Surgeon:  Saundra Curl, MD;  Location: [facility] ORS;  Service: Orthopedics;  Laterality: Left;   TRIGGER FINGER RELEASE Right 08/08/2013   Procedure: RELEASE STENOSING TENOSYNOVITIS RIGHT THUMB;  Surgeon: Kemp Patter, MD;  Location: [facility];  Service: Orthopedics;  Laterality: Right;   Current Outpatient Medications on File Prior to Visit  Medication Sig Dispense Refill   acetaminophen  (TYLENOL ) 500 MG tablet Take 1 tablet (500 mg total) by mouth every 6 (six) hours as needed for mild pain (pain score 1-3) or moderate pain (pain score 4-6). 30 tablet 0   carvedilol  (COREG ) 6.25 MG tablet TAKE 1 TABLET (6.25 MG TOTAL) BY MOUTH 2 (TWO) TIMES DAILY WITH A MEAL. NEEDS FOLLOW UP APPOINTMENT 180 tablet 3   colchicine  0.6 MG tablet Take 1 tablet (0.6 mg total) by mouth daily. 30 tablet 3   dapagliflozin  propanediol (FARXIGA ) 10 MG TABS tablet Take 1 tablet (10 mg total) by mouth daily before breakfast. 30 tablet 11   diclofenac Sodium (VOLTAREN) 1 % GEL Apply 2 g topically daily as needed (pain).      ENTRESTO  24-26 MG TAKE 1 TABLET BY MOUTH TWICE A DAY 60 tablet 11   famotidine  (PEPCID ) 20 MG tablet Take 1 tablet (20 mg total) by mouth daily as needed for heartburn or indigestion. 30 tablet 0   gabapentin  (NEURONTIN ) 300 MG capsule Take 300 mg by mouth daily.     KLOR-CON  M20 20 MEQ tablet TAKE 1 TABLET BY MOUTH EVERY DAY 90 tablet 3   MAGNESIUM  PO Take 1 tablet by mouth daily.     methocarbamol  (ROBAXIN -750) 750 MG tablet Take 1 tablet (750 mg total) by mouth every 8 (eight) hours as needed for muscle spasms. MAY CAUSE DROWSINESS! 20 tablet 0   Multiple Vitamins-Minerals (MULTIVITAMIN GUMMIES ADULT PO) Take 1 tablet by mouth daily.     NON FORMULARY Pt uses a cpap nightly     ondansetron  (ZOFRAN -ODT) 4 MG disintegrating tablet Take 1 tablet (4 mg total) by mouth every 8 (eight) hours as needed for nausea or vomiting. 15 tablet 0   OVER THE COUNTER MEDICATION Swiss krisp for constipstion      OVER THE COUNTER MEDICATION Take 15 mLs by mouth as needed (constipation). duralax     simethicone  (MYLICON) 80 MG chewable tablet Chew 1 tablet (80 mg total) by mouth every 6 (six) hours as needed for flatulence. 20 tablet 0   spironolactone  (ALDACTONE ) 25 MG tablet TAKE 1 TABLET BY MOUTH EVERY DAY 90 tablet 3   tadalafil (CIALIS) 10 MG tablet Take 10 mg by mouth daily as needed for erectile dysfunction.     torsemide  (DEMADEX ) 20 MG tablet Take 20 mg by mouth daily.     XARELTO  20 MG TABS tablet TAKE  1 TABLET BY MOUTH DAILY WITH SUPPER 30 tablet 10   No current facility-administered medications on file prior to visit.   Allergies  Allergen Reactions   Metoprolol  Tartrate Shortness Of Breath   Oxycodone  Rash and Other (See Comments)    Can tolerate Hydrocodone    Social History   Socioeconomic History   Marital status: Married    Spouse name: Not on file   Number of children: Not on file   Years of education: Not on file   Highest education level: Not on file  Occupational History   Occupation: Public affairs consultant  Tobacco Use   Smoking status: Never    Passive exposure: Never   Smokeless tobacco: Never  Vaping Use   Vaping status: Never Used  Substance and Sexual Activity   Alcohol use: No   Drug use: No   Sexual activity: Yes  Other Topics Concern   Not on file  Social History Narrative   Pt lives in [place] with spouse.  Leadership training and behavioral safety training.   Social Drivers of Health   Financial Resource Strain: Medium Risk (10/29/2023)   Overall Financial Resource Strain (CARDIA)    Difficulty of Paying Living Expenses: Somewhat hard  Food Insecurity: No Food Insecurity (10/29/2023)   Hunger Vital Sign    Worried About Running Out of Food in the Last Year: Never true    Ran Out of Food in the Last Year: Never true  Transportation Needs: No Transportation Needs (10/29/2023)   PRAPARE - Administrator, Civil Service  (Medical): No    Lack of Transportation (Non-Medical): No  Physical Activity: Inactive (10/29/2023)   Exercise Vital Sign    Days of Exercise per Week: 0 days    Minutes of Exercise per Session: 0 min  Stress: Stress Concern Present (10/29/2023)   [employer] of Occupational Health - Occupational Stress Questionnaire    Feeling of Stress : To some extent  Social Connections: Unknown (10/29/2023)   Social Connection and Isolation Panel [NHANES]    Frequency of Communication with Friends and Family: More than three times a week    Frequency of Social Gatherings with Friends and Family: More than three times a week    Attends Religious Services: Patient unable to answer    Active Member of Clubs or Organizations: Yes    Attends Banker Meetings: More than 4 times per year    Marital Status: Married  Catering manager Violence: Not At Risk (10/29/2023)   Humiliation, Afraid, Rape, and Kick questionnaire    Fear of Current or Ex-Partner: No    Emotionally Abused: No    Physically Abused: No    Sexually Abused: No      Review of Systems  All other systems reviewed and are negative.      Objective:   Physical Exam Vitals reviewed.  Constitutional:      General: He is not in acute distress.    Appearance: Normal appearance. He is obese. He is not ill-appearing or toxic-appearing.  Cardiovascular:     Rate and Rhythm: Normal rate and regular rhythm.     Heart sounds: Normal heart sounds.  Pulmonary:     Effort: Pulmonary effort is normal. No respiratory distress.     Breath sounds: Normal breath sounds. No stridor. No wheezing, rhonchi or rales.  Abdominal:     General: Abdomen is flat. Bowel sounds are normal.     Palpations: Abdomen is soft.  Musculoskeletal:  Right lower leg: No edema.     Left lower leg: No edema.  Skin:    Findings: No erythema.  Neurological:     General: No focal deficit present.     Mental Status: He is alert and oriented to  person, place, and time.     Cranial Nerves: No cranial nerve deficit.     Sensory: No sensory deficit.     Motor: No weakness.     Coordination: Coordination normal.     Gait: Gait abnormal.           Assessment & Plan:  Chronic congestive heart failure, unspecified heart failure type (HCC) - Plan: CBC with Differential/Platelet, COMPLETE METABOLIC PANEL WITHOUT GFR  Persistent atrial fibrillation (HCC) We discussed Zepbound  and I feel the patient could try this medication again in the future if he wanted to.  However I believe that the current reaction was a perfect storm of several different variables all interacting at the same time.  Patient elects not to use the medication for the foreseeable.  Given the fact that he is on Entresto , Klor-Con , spironolactone , and torsemide , I would like to check his electrolytes today to rule out any potassium abnormalities.  Otherwise, his blood pressure today is excellent and he is on maximum medical therapy for congestive heart failure.  He appears euvolemic today and denies any chest pain or shortness of breath.

## 2023-11-04 NOTE — Progress Notes (Signed)
 EPIC Encounter for ICM Monitoring  Patient Name: [name] is a 73 y.o. male Date: 11/04/2023 Primary Care Physican: Austine Lefort, MD Primary Cardiologist: Mitzie Anda Electrophysiologist: Kasandra Pain Pacing: >99%        12/02/2021 Office Weight: 267 lbs 12/10/2021 Office Weight: 276 lbs 12/17/2021 Weight:  Has not weighed since last OV 09/12/2022 Office Weight: 285 lbs 02/04/2023 Weight: 278 lbs 05/29/2023 Weight: 269 lbs 08/20/2023 Weight: 269 lbs 11/04/2023 Weight: 247 lbs                                                          AT/AF Burden: 100% (taking Xarelto )    Spoke with patient and heart failure questions reviewed.  Transmission results reviewed.  Pt feeling better after hospitalization but trying to regain his strength.     CorVue thoracic impedance suggesting fluid levels returned to normal 4/18.     Prescribed: Torsemide  20 mg take 1 tablet (20 mg total) daily.  He self adjusts Torsemide  if needed.  Klor Con 20 mEq take take 1 tablet daily  Spironolactone  25 mg take 1 tablet daily   Labs: 11/02/2023 Creatinine 1.24, BUN 26, Potassium 5.2, Sodium 139 10/27/2023 Creatinine 1.44, BUN 27, Potassium 3.3, Sodium 135, GFR 52  10/26/2023 Creatinine 1.48, BUN 29, Potassium 3.5, Sodium 136, GFR 51  07/27/2023 Creatinine 1.35, BUN 30, Potassium 4.8, Sodium 140, GFR 56 05/19/2023 Creatinine 1.42, BUN 29, Potassium 4.8, Sodium 140, GFR 53 04/13/2023 Creatinine 1.27, BUN 27, Potassium 5.0, Sodium 137 A complete set of results can be found in Results Review.   Recommendations:  No changes and encouraged to call if experiencing any fluid symptoms.   Follow-up plan: ICM clinic phone appointment on 11/30/2023.  91 day device clinic remote transmission 12/15/2023.     EP/Cardiology Office Visits:   02/08/2024 with HF clinic.  Recall 09/08/2024 with Dr Marven Slimmer or EP APP.    Copy of ICM check sent to Dr. Marven Slimmer.     3 month ICM trend: 11/04/2023.    12-14 Month ICM trend:      Almyra Jain, RN 11/04/2023 8:22 AM

## 2023-11-16 DIAGNOSIS — M6281 Muscle weakness (generalized): Secondary | ICD-10-CM | POA: Diagnosis not present

## 2023-11-16 DIAGNOSIS — M25662 Stiffness of left knee, not elsewhere classified: Secondary | ICD-10-CM | POA: Diagnosis not present

## 2023-11-16 DIAGNOSIS — R262 Difficulty in walking, not elsewhere classified: Secondary | ICD-10-CM | POA: Diagnosis not present

## 2023-11-16 DIAGNOSIS — M1712 Unilateral primary osteoarthritis, left knee: Secondary | ICD-10-CM | POA: Diagnosis not present

## 2023-11-18 DIAGNOSIS — M6281 Muscle weakness (generalized): Secondary | ICD-10-CM | POA: Diagnosis not present

## 2023-11-18 DIAGNOSIS — R262 Difficulty in walking, not elsewhere classified: Secondary | ICD-10-CM | POA: Diagnosis not present

## 2023-11-18 DIAGNOSIS — M1712 Unilateral primary osteoarthritis, left knee: Secondary | ICD-10-CM | POA: Diagnosis not present

## 2023-11-18 DIAGNOSIS — M25662 Stiffness of left knee, not elsewhere classified: Secondary | ICD-10-CM | POA: Diagnosis not present

## 2023-11-23 ENCOUNTER — Encounter

## 2023-11-23 ENCOUNTER — Telehealth: Payer: Self-pay

## 2023-11-23 DIAGNOSIS — M6281 Muscle weakness (generalized): Secondary | ICD-10-CM | POA: Diagnosis not present

## 2023-11-23 DIAGNOSIS — M1712 Unilateral primary osteoarthritis, left knee: Secondary | ICD-10-CM | POA: Diagnosis not present

## 2023-11-23 DIAGNOSIS — R262 Difficulty in walking, not elsewhere classified: Secondary | ICD-10-CM | POA: Diagnosis not present

## 2023-11-23 DIAGNOSIS — M25662 Stiffness of left knee, not elsewhere classified: Secondary | ICD-10-CM | POA: Diagnosis not present

## 2023-11-23 NOTE — Telephone Encounter (Signed)
 Returned patient call as requested by voice mail message.  He requested to check fluid levels every 2 months instead of monthly due to Copay cost of $56 for each ICM remote transmission.  Advised will monitor fluid levels every 2 months and explained he can request remote transmission to be read anytime with the 2 month period if he is experiencing fluid symptoms and he agreed.  Next ICM remote transmission rescheduled for 6/12.

## 2023-11-24 ENCOUNTER — Other Ambulatory Visit (HOSPITAL_COMMUNITY): Payer: Self-pay

## 2023-11-24 ENCOUNTER — Other Ambulatory Visit: Payer: Self-pay | Admitting: Family Medicine

## 2023-11-24 ENCOUNTER — Other Ambulatory Visit: Payer: Self-pay | Admitting: Cardiovascular Disease

## 2023-11-24 DIAGNOSIS — E669 Obesity, unspecified: Secondary | ICD-10-CM

## 2023-11-24 DIAGNOSIS — G4733 Obstructive sleep apnea (adult) (pediatric): Secondary | ICD-10-CM

## 2023-11-24 DIAGNOSIS — I5022 Chronic systolic (congestive) heart failure: Secondary | ICD-10-CM

## 2023-11-25 ENCOUNTER — Other Ambulatory Visit: Payer: Self-pay | Admitting: Family Medicine

## 2023-11-26 ENCOUNTER — Other Ambulatory Visit: Payer: Self-pay | Admitting: Pharmacist

## 2023-11-26 ENCOUNTER — Other Ambulatory Visit: Payer: Self-pay | Admitting: Family Medicine

## 2023-11-26 ENCOUNTER — Encounter (HOSPITAL_COMMUNITY): Payer: Self-pay

## 2023-11-26 ENCOUNTER — Other Ambulatory Visit (HOSPITAL_COMMUNITY): Payer: Self-pay

## 2023-11-26 DIAGNOSIS — E669 Obesity, unspecified: Secondary | ICD-10-CM

## 2023-11-26 DIAGNOSIS — G4733 Obstructive sleep apnea (adult) (pediatric): Secondary | ICD-10-CM

## 2023-11-26 MED ORDER — HYDROCODONE-ACETAMINOPHEN 5-325 MG PO TABS: 1.0000 | ORAL_TABLET | Freq: Four times a day (QID) | ORAL | 0 refills | Status: DC | PRN

## 2023-11-26 MED ORDER — GABAPENTIN 300 MG PO CAPS: 300.0000 mg | ORAL_CAPSULE | Freq: Every day | ORAL | 3 refills | Status: DC

## 2023-11-26 NOTE — Telephone Encounter (Signed)
 Requested Prescriptions  Refused Prescriptions Disp Refills   tirzepatide  (ZEPBOUND ) 2.5 MG/0.5ML Pen 2 mL 0    Sig: Inject 2.5 mg into the skin once a week.     Off-Protocol Failed - 11/26/2023 11:51 AM      Failed - Medication not assigned to a protocol, review manually.      Passed - Valid encounter within last 12 months    Recent Outpatient Visits           3 weeks ago Chronic congestive heart failure, unspecified heart failure type [facility])   [place] [facility] Austine Lefort, MD   4 months ago Persistent atrial fibrillation [facility])   [place] [facility] Austine Lefort, MD   7 months ago Acute left ankle pain   [place] [facility] Austine Lefort, MD   7 months ago Acute left ankle pain   [place] [facility] Austine Lefort, MD   7 months ago Persistent atrial fibrillation [facility])   [place] [facility] Pickard, Cisco Crest, MD

## 2023-11-27 ENCOUNTER — Other Ambulatory Visit (HOSPITAL_COMMUNITY): Payer: Self-pay

## 2023-11-27 ENCOUNTER — Encounter (HOSPITAL_COMMUNITY): Payer: Self-pay

## 2023-11-27 MED ORDER — ZEPBOUND 2.5 MG/0.5ML ~~LOC~~ SOAJ
2.5000 mg | SUBCUTANEOUS | 0 refills | Status: DC
  Filled 2023-11-27 (×2): qty 2, 28d supply, fill #0

## 2023-11-28 DIAGNOSIS — M25562 Pain in left knee: Secondary | ICD-10-CM | POA: Diagnosis not present

## 2023-11-30 ENCOUNTER — Encounter

## 2023-12-15 ENCOUNTER — Ambulatory Visit (INDEPENDENT_AMBULATORY_CARE_PROVIDER_SITE_OTHER): Payer: Medicare HMO

## 2023-12-15 DIAGNOSIS — I428 Other cardiomyopathies: Secondary | ICD-10-CM | POA: Diagnosis not present

## 2023-12-15 LAB — CUP PACEART REMOTE DEVICE CHECK
Battery Remaining Longevity: 37 mo
Battery Remaining Percentage: 40 %
Battery Voltage: 2.93 V
Date Time Interrogation Session: 20250603021209
HighPow Impedance: 79 Ohm
Implantable Lead Connection Status: 753985
Implantable Lead Connection Status: 753985
Implantable Lead Connection Status: 753985
Implantable Lead Implant Date: 20210209
Implantable Lead Implant Date: 20210209
Implantable Lead Implant Date: 20210209
Implantable Lead Location: 753858
Implantable Lead Location: 753859
Implantable Lead Location: 753860
Implantable Pulse Generator Implant Date: 20210209
Lead Channel Impedance Value: 390 Ohm
Lead Channel Impedance Value: 440 Ohm
Lead Channel Impedance Value: 810 Ohm
Lead Channel Pacing Threshold Amplitude: 1 V
Lead Channel Pacing Threshold Amplitude: 1.5 V
Lead Channel Pacing Threshold Pulse Width: 0.5 ms
Lead Channel Pacing Threshold Pulse Width: 0.5 ms
Lead Channel Sensing Intrinsic Amplitude: 0.4 mV
Lead Channel Sensing Intrinsic Amplitude: 12 mV
Lead Channel Setting Pacing Amplitude: 2.5 V
Lead Channel Setting Pacing Amplitude: 2.5 V
Lead Channel Setting Pacing Pulse Width: 0.5 ms
Lead Channel Setting Pacing Pulse Width: 0.5 ms
Lead Channel Setting Sensing Sensitivity: 0.5 mV
Pulse Gen Serial Number: 111006442
Zone Setting Status: 755011

## 2023-12-19 ENCOUNTER — Ambulatory Visit: Payer: Self-pay | Admitting: Cardiology

## 2023-12-24 ENCOUNTER — Ambulatory Visit: Attending: Cardiology

## 2023-12-24 DIAGNOSIS — Z95810 Presence of automatic (implantable) cardiac defibrillator: Secondary | ICD-10-CM

## 2023-12-24 DIAGNOSIS — I5022 Chronic systolic (congestive) heart failure: Secondary | ICD-10-CM

## 2023-12-25 NOTE — Progress Notes (Signed)
 EPIC Encounter for ICM Monitoring  Patient Name: [name] is a 73 y.o. male Date: 12/25/2023 Primary Care Physican: Austine Lefort, MD Primary Cardiologist: Mitzie Anda Electrophysiologist: Kasandra Pain Pacing: >99%        12/02/2021 Office Weight: 267 lbs 12/10/2021 Office Weight: 276 lbs 12/17/2021 Weight:  Has not weighed since last OV 09/12/2022 Office Weight: 285 lbs 02/04/2023 Weight: 278 lbs 05/29/2023 Weight: 269 lbs 08/20/2023 Weight: 269 lbs 11/04/2023 Weight: 247 lbs                                                          AT/AF Burden: 100% (taking Xarelto )    Spoke with patient and heart failure questions reviewed.  Transmission results reviewed.  Pt was in [place] this week and unable to take fluid pill due to traveling due to sister had kidney removed because of cancer.  He did double Torsemide  yesterday, 6/12.   He had complication after knee surgery that was related to IT band pulling away from knee.  He reported his chair broke and he fell backwards hitting his head today but he is okay.  Advised he should go to ER due to he is taking Xarelto  and could develop bleeding on the brain.  He declined at this time but understood the importance to call 911 if he has changes in condition.     CorVue thoracic impedance suggesting intermittent days with possible fluid accumulation from 5/5-6/10.  Possible fluid accumulation starting 6/10 but he has doubled his Torsemide  x 1 day.  Also had possible fluid accumulation from 4/23-5/5.   Prescribed: Torsemide  20 mg take 1 tablet (20 mg total) daily.  He self adjusts Torsemide  if needed.  Klor Con 20 mEq take take 1 tablet daily  Spironolactone  25 mg take 1 tablet daily   Labs: 11/02/2023 Creatinine 1.24, BUN 26, Potassium 5.2, Sodium 139 10/27/2023 Creatinine 1.44, BUN 27, Potassium 3.3, Sodium 135, GFR 52  10/26/2023 Creatinine 1.48, BUN 29, Potassium 3.5, Sodium 136, GFR 51  07/27/2023 Creatinine 1.35, BUN 30, Potassium 4.8, Sodium 140, GFR  56 05/19/2023 Creatinine 1.42, BUN 29, Potassium 4.8, Sodium 140, GFR 53 04/13/2023 Creatinine 1.27, BUN 27, Potassium 5.0, Sodium 137 A complete set of results can be found in Results Review.   Recommendations:  No changes and encouraged to call if experiencing any fluid symptoms.   Follow-up plan: ICM clinic phone appointment on 02/23/2024 (pt prefers every 2 months).  91 day device clinic remote transmission 12/15/2023.     EP/Cardiology Office Visits:   02/08/2024 with HF clinic.  Recall 09/08/2024 with Dr Marven Slimmer or EP APP.    Copy of ICM check sent to Dr. Marven Slimmer.     3 month ICM trend: 12/24/2023.    12-14 Month ICM trend:     Almyra Jain, RN 12/25/2023 4:20 PM

## 2023-12-28 ENCOUNTER — Other Ambulatory Visit (HOSPITAL_COMMUNITY): Payer: Self-pay

## 2023-12-28 ENCOUNTER — Other Ambulatory Visit: Payer: Self-pay | Admitting: Pharmacist

## 2023-12-28 DIAGNOSIS — E669 Obesity, unspecified: Secondary | ICD-10-CM

## 2023-12-28 DIAGNOSIS — M1712 Unilateral primary osteoarthritis, left knee: Secondary | ICD-10-CM | POA: Diagnosis not present

## 2023-12-28 DIAGNOSIS — G4733 Obstructive sleep apnea (adult) (pediatric): Secondary | ICD-10-CM

## 2023-12-28 DIAGNOSIS — I484 Atypical atrial flutter: Secondary | ICD-10-CM

## 2023-12-28 DIAGNOSIS — M6281 Muscle weakness (generalized): Secondary | ICD-10-CM | POA: Diagnosis not present

## 2023-12-28 DIAGNOSIS — R262 Difficulty in walking, not elsewhere classified: Secondary | ICD-10-CM | POA: Diagnosis not present

## 2023-12-28 DIAGNOSIS — I5022 Chronic systolic (congestive) heart failure: Secondary | ICD-10-CM

## 2023-12-28 DIAGNOSIS — M25662 Stiffness of left knee, not elsewhere classified: Secondary | ICD-10-CM | POA: Diagnosis not present

## 2023-12-28 MED ORDER — CARVEDILOL 6.25 MG PO TABS: 6.2500 mg | ORAL_TABLET | Freq: Two times a day (BID) | ORAL | 0 refills | Status: DC

## 2023-12-29 ENCOUNTER — Other Ambulatory Visit (HOSPITAL_COMMUNITY): Payer: Self-pay

## 2023-12-29 ENCOUNTER — Other Ambulatory Visit: Payer: Self-pay

## 2023-12-29 MED ORDER — ZEPBOUND 2.5 MG/0.5ML ~~LOC~~ SOAJ
2.5000 mg | SUBCUTANEOUS | 0 refills | Status: DC
  Filled 2023-12-29: qty 2, 28d supply, fill #0

## 2024-01-12 ENCOUNTER — Encounter: Payer: Self-pay | Admitting: Family Medicine

## 2024-01-12 ENCOUNTER — Other Ambulatory Visit: Payer: Self-pay

## 2024-01-12 DIAGNOSIS — G629 Polyneuropathy, unspecified: Secondary | ICD-10-CM

## 2024-01-12 MED ORDER — GABAPENTIN 300 MG PO CAPS: 300.0000 mg | ORAL_CAPSULE | Freq: Three times a day (TID) | ORAL | 3 refills | Status: DC

## 2024-01-13 DIAGNOSIS — M1712 Unilateral primary osteoarthritis, left knee: Secondary | ICD-10-CM | POA: Diagnosis not present

## 2024-01-14 ENCOUNTER — Telehealth: Payer: Self-pay

## 2024-01-14 NOTE — Telephone Encounter (Signed)
 Pt came into office to request a refill of this med please.  HYDROcodone -acetaminophen  (NORCO/VICODIN) 5-325 MG tablet [[id number]]   PHARMACY: [id number] - [place], [place] - [address] [place], [place] [address] Phone: [phone]  Fax: [phone] DEA #: [id number]  LOV: 11/02/23  CB#: [phone]

## 2024-01-15 ENCOUNTER — Other Ambulatory Visit: Payer: Self-pay | Admitting: Family Medicine

## 2024-01-15 MED ORDER — HYDROCODONE-ACETAMINOPHEN 5-325 MG PO TABS: 1.0000 | ORAL_TABLET | Freq: Four times a day (QID) | ORAL | 0 refills | Status: DC | PRN

## 2024-01-20 ENCOUNTER — Other Ambulatory Visit: Payer: Self-pay | Admitting: Cardiovascular Disease

## 2024-01-20 ENCOUNTER — Other Ambulatory Visit (HOSPITAL_COMMUNITY): Payer: Self-pay

## 2024-01-20 DIAGNOSIS — G4733 Obstructive sleep apnea (adult) (pediatric): Secondary | ICD-10-CM

## 2024-01-20 DIAGNOSIS — E669 Obesity, unspecified: Secondary | ICD-10-CM

## 2024-01-21 ENCOUNTER — Other Ambulatory Visit (HOSPITAL_COMMUNITY): Payer: Self-pay

## 2024-01-21 ENCOUNTER — Other Ambulatory Visit: Payer: Self-pay

## 2024-01-21 MED ORDER — ZEPBOUND 5 MG/0.5ML ~~LOC~~ SOAJ
5.0000 mg | SUBCUTANEOUS | 0 refills | Status: DC
  Filled 2024-01-21: qty 2, 28d supply, fill #0

## 2024-01-23 DIAGNOSIS — M25552 Pain in left hip: Secondary | ICD-10-CM | POA: Diagnosis not present

## 2024-01-23 DIAGNOSIS — M5416 Radiculopathy, lumbar region: Secondary | ICD-10-CM | POA: Diagnosis not present

## 2024-01-28 DIAGNOSIS — M5416 Radiculopathy, lumbar region: Secondary | ICD-10-CM | POA: Diagnosis not present

## 2024-01-29 ENCOUNTER — Other Ambulatory Visit (HOSPITAL_COMMUNITY): Payer: Self-pay

## 2024-02-05 ENCOUNTER — Telehealth (HOSPITAL_COMMUNITY): Payer: Self-pay

## 2024-02-05 NOTE — Telephone Encounter (Signed)
 Called to confirm/remind patient of their appointment at the [facility] on 02/05/24.   Appointment:   [] Confirmed  [x] Left mess   [] No answer/No voice mail  [] VM Full/unable to leave message  [] Phone not in service  And to bring in all medications and/or complete list.

## 2024-02-05 NOTE — Progress Notes (Addendum)
 PCP: Duanne Butler DASEN, MD EP: Dr. Kelsie Cardiology: Dr. Court HF Cardiology: Dr. Rolan   73 y.o. with history of chronic atrial fibrillation and flutter with failed Maze procedure, valvular heart disease s/p mitral and aortic valve repairs, [name]  Patient has a long history of atrial fibrillation.  He was initially controlled by Tikosyn  but it eventually became chronic.  He had significant mitral and aortic regurgitation, and in 11/19, he had mitral and aortic valve repairs with Maze and LA appendage ligation. Cath prior to valve surgery in 10/19 showed no significant coronary disease.  It appears from ECGs that he never went back into NSR.  He has had slow atrial fibrillation with rate in upper 30s-40s at times, so was never started on amiodarone .  Last echo in 4/20 showed EF 40-45% with stable aortic and mitral valve repairs.  He has developed worsening volume overload, and Lasix  has been titrated up to 80 mg bid.  Most recently, he went into atypical atrial flutter with HR consistently in the 110s range.  He therefore was taken for DCCV 03/10/19.  Initially rhythm looked junctional in the 40s-50s, but he subsequently went back into rapid atypical atrial flutter with elevated rate (>100 bpm).   Echo was done in 9/20, showing EF worse at 20-25%.     He saw Dr. Kelsie and had atrial flutter + atrial fibrillation ablation in 10/20.  Post-procedure, he was noted to be in slow atrial fibrillation (rate 40s) at time of his atrial fibrillation clinic followup.   Patient had St Jude CRT-D implantation in 2/21.  In 6/21, he had AV nodal ablation to allow CRT given recurrent atrial flutter/fibrillation. Echo in 10/21 showed EF up to 60-65%.    He was seen in atrial fibrillation clinic in 5/23 and was noted to be in atrial fibrillation since 4/23.  Thoracic impedance suggested fluid retention. He was in atrial fibrillation when I saw him in  5/23 and was set up for DCCV in 6/23. He went back into NSR at the time but is in atrial fibrillation again today.   Echo in 7/23 showed EF 50-55%, global hypokinesis, mildly dilated and mildly dysfunctional RV, s/p MV repair with no MR and mean gradient 4, s/p aortic valve repair with mild AS mean gradient 11 and mild AI, normal IVC.   Echo in 7/24 showed EF 55%, mild LVH, mild RV enlargement with mildly decreased RV systolic function, s/p aortic valve repair with mean gradient 12, s/p mitral valve repair with mean gradient 4 mmHg, IVC normal, PASP 27 mmHg.   Echo 3/25 showed EF 55-60%, stable valves. S/p left knee arthroplasty 4/25  Admitted 4/25 with CP 2/2 to days of N/V and abdominal pain. HsTroponin mildly elevated, cardiology consulted, felt 2/2 to demand ischemic 2/2 HTN urgency. V/Q scan negative. GI symptoms felt to be related to Zepbound  and Rx was discontinued.   Today he returns for HF follow up with his wife. Overall feeling fine. Rides his E-bike for exercise for 5 miles at a time, no dyspnea with this. Has generalized arthritis and neuropathy in bilateral feet. Denies palpitations, abnormal bleeding, CP, dizziness, edema, or PND/Orthopnea. Appetite ok. Weight at home 245 pounds. Taking all medications. Wears CPAP, rare ETOH, no drugs. Not sleeping well due to neuropathy. BP 104/60 at home.   ECG (personally reviewed): none ordered today.  St Jude device interrogation: >99% BiV pacing, CorVue stable, he is in atrial fibrillation  persistently, no VT.   Labs (7/23): K 4, creatinine 1.4, LDL 117 Labs (3/24): K 3.8, creatinine 1.2, LDL 105, hgb 14.6 Labs (1/25): K 4.8, creatinine 1.35 Labs (4/25): K 5.2, creatinine 1.24  PMH: 1. HTN 2. Paroxysmal atrial fibrillation/flutter: Failed Tikosyn .  Had Maze procedure 11/19 but does not appear to have ever gone back into NSR.  Had been in atrial fibrillation with bradycardia with rate in 30s-40s at times, now in atypical atrial flutter  primarily with rate 100s-110s.  He failed DCCV 8/20.  No amiodarone  due to h/o bradycardia.  - Atrial fibrillation + atrial flutter ablation in 10/20.  - DCCV to NSR in 6/23 but quickly back into atrial fibrillation.  3. OSA: Uses CPAP.  4. Chronic systolic CHF: Nonischemic cardiomyopathy.  - 10/19 LHC: No coronary disease.  - Echo (4/20): EF 40-45%, mild LVH, moderate dilated RV with mildly decreased systolic function, s/p MV repair with no significant stenosis or regurgitation, s/p aortic valve repair with mild AI, mild aortic stenosis.  Dilated IVC.  - Echo (9/20): EF 20-25%, mild LVH with moderate LV dilation, s/p MV repair with mean gradient 6, s/p AoV repair with mean gradient 8. No significant MR or AI.  - St Jude CRT-D implantation in 2/21.  - AV nodal ablation 6/21.  - Echo (10/21): EF 60-65%, mild LV enlargement and LVH, moderate RV enlargement with normal function, severe LVE, s/p MV repair with no MR and mean gradient 3, s/p aortic valve repair with mean gradient 9 and no AI, ascending aorta 4.6 cm.  - Echo (7/23): EF 50-55%, global hypokinesis, mildly dilated and mildly dysfunctional RV, s/p MV repair with no MR and mean gradient 4, s/p aortic valve repair with mild AS mean gradient 11 and mild AI, normal IVC.  - Echo (7/24): EF 55%, mild LVH, mild RV enlargement with mildly decreased RV systolic function, s/p aortic valve repair with mean gradient 12, s/p mitral valve repair with mean gradient 4 mmHg, IVC normal, PASP 27 mmHg.  - Echo (3/25): EF 55-60%, normal RV, s/p aortic valve repair with mean gradient 10 mmHg, s/p mitral vale repair with mean gradient 3.0 mmHg 5. Valvular heart disease: Patient has history of MR and AI.  In 11/19, he had mitral valve repair, aortic valve repair, surgical Maze, and LA appendage excision.  6. LBBB 7. Ascending aorta aneurysm: 4.6 cm on 10/21 echo.  - CTA chest (7/22): 4.1 cm ascending aorta 8. Bilateral THRs  Social History   Socioeconomic  History   Marital status: Married    Spouse name: Not on file   Number of children: Not on file   Years of education: Not on file   Highest education level: Not on file  Occupational History   Occupation: Public affairs consultant  Tobacco Use   Smoking status: Never    Passive exposure: Never   Smokeless tobacco: Never  Vaping Use   Vaping status: Never Used  Substance and Sexual Activity   Alcohol use: No   Drug use: No   Sexual activity: Yes  Other Topics Concern   Not on file  Social History Narrative   Pt lives in [place] with spouse.  Leadership training and behavioral safety training.   Social Drivers of Health   Financial Resource Strain: Medium Risk (10/29/2023)   Overall Financial Resource Strain (CARDIA)    Difficulty of Paying Living Expenses: Somewhat hard  Food Insecurity: No Food Insecurity (10/29/2023)   Hunger Vital Sign  Worried About Programme researcher, broadcasting/film/video in the Last Year: Never true    Ran Out of Food in the Last Year: Never true  Transportation Needs: No Transportation Needs (10/29/2023)   PRAPARE - Administrator, Civil Service (Medical): No    Lack of Transportation (Non-Medical): No  Physical Activity: Inactive (10/29/2023)   Exercise Vital Sign    Days of Exercise per Week: 0 days    Minutes of Exercise per Session: 0 min  Stress: Stress Concern Present (10/29/2023)   [employer] of Occupational Health - Occupational Stress Questionnaire    Feeling of Stress : To some extent  Social Connections: Unknown (10/29/2023)   Social Connection and Isolation Panel    Frequency of Communication with Friends and Family: More than three times a week    Frequency of Social Gatherings with Friends and Family: More than three times a week    Attends Religious Services: Patient unable to answer    Active Member of Clubs or Organizations: Yes    Attends Banker Meetings: More than 4 times per year    Marital Status:  Married  Catering manager Violence: Not At Risk (10/29/2023)   Humiliation, Afraid, Rape, and Kick questionnaire    Fear of Current or Ex-Partner: No    Emotionally Abused: No    Physically Abused: No    Sexually Abused: No   Family History  Problem Relation Age of Onset   Heart Problems Mother    Arthritis Mother    Cancer Sister    Other Sister        bilateral hip replacements   Healthy Daughter    Healthy Son    Healthy Son    Other Other        mother had a pacemaker   ROS: All systems reviewed and negative except as per HPI.   Current Outpatient Medications  Medication Sig Dispense Refill   acetaminophen  (TYLENOL ) 500 MG tablet Take 1 tablet (500 mg total) by mouth every 6 (six) hours as needed for mild pain (pain score 1-3) or moderate pain (pain score 4-6). 30 tablet 0   carvedilol  (COREG ) 6.25 MG tablet Take 1 tablet (6.25 mg total) by mouth 2 (two) times daily with a meal. 180 tablet 0   colchicine  0.6 MG tablet Take 1 tablet (0.6 mg total) by mouth daily. 30 tablet 3   dapagliflozin  propanediol (FARXIGA ) 10 MG TABS tablet Take 1 tablet (10 mg total) by mouth daily before breakfast. 30 tablet 11   diclofenac Sodium (VOLTAREN) 1 % GEL Apply 2 g topically daily as needed (pain).      ENTRESTO  24-26 MG TAKE 1 TABLET BY MOUTH TWICE A DAY 60 tablet 11   gabapentin  (NEURONTIN ) 300 MG capsule Take 1 capsule (300 mg total) by mouth 3 (three) times daily. 90 capsule 3   HYDROcodone -acetaminophen  (NORCO/VICODIN) 5-325 MG tablet Take 1 tablet by mouth every 6 (six) hours as needed for moderate pain (pain score 4-6). 30 tablet 0   KLOR-CON  M20 20 MEQ tablet TAKE 1 TABLET BY MOUTH EVERY DAY 90 tablet 3   MAGNESIUM  PO Take 1 tablet by mouth daily.     methocarbamol  (ROBAXIN -750) 750 MG tablet Take 1 tablet (750 mg total) by mouth every 8 (eight) hours as needed for muscle spasms. MAY CAUSE DROWSINESS! 20 tablet 0   Multiple Vitamins-Minerals (MULTIVITAMIN GUMMIES ADULT PO) Take 1  tablet by mouth daily.     NON FORMULARY Pt uses a  cpap nightly     OVER THE COUNTER MEDICATION Swiss krisp for constipstion     OVER THE COUNTER MEDICATION Take 15 mLs by mouth as needed (constipation). duralax     spironolactone  (ALDACTONE ) 25 MG tablet TAKE 1 TABLET BY MOUTH EVERY DAY 90 tablet 3   tadalafil (CIALIS) 10 MG tablet Take 10 mg by mouth daily as needed for erectile dysfunction.     tirzepatide  (ZEPBOUND ) 5 MG/0.5ML Pen Inject 5 mg into the skin once a week. 2 mL 0   torsemide  (DEMADEX ) 20 MG tablet Take 20 mg by mouth daily.     XARELTO  20 MG TABS tablet TAKE 1 TABLET BY MOUTH DAILY WITH SUPPER 30 tablet 10   famotidine  (PEPCID ) 20 MG tablet Take 1 tablet (20 mg total) by mouth daily as needed for heartburn or indigestion. 30 tablet 0   ondansetron  (ZOFRAN -ODT) 4 MG disintegrating tablet Take 1 tablet (4 mg total) by mouth every 8 (eight) hours as needed for nausea or vomiting. 15 tablet 0   simethicone  (MYLICON) 80 MG chewable tablet Chew 1 tablet (80 mg total) by mouth every 6 (six) hours as needed for flatulence. 20 tablet 0   No current facility-administered medications for this encounter.   Wt Readings from Last 3 Encounters:  02/08/24 112.4 kg (247 lb 12.8 oz)  11/02/23 114.4 kg (252 lb 3.2 oz)  10/29/23 115.2 kg (254 lb)   BP 110/60   Pulse 62   Ht 5' 10 (1.778 m)   Wt 112.4 kg (247 lb 12.8 oz)   SpO2 97%   BMI 35.56 kg/m  Physical Exam: General:  NAD. No resp difficulty, walked into clinic HEENT: Normal Neck: Supple. No JVD. Thick neck Cor: Regular rate & rhythm. No rubs, gallops or murmurs. Lungs: Clear Abdomen: Soft, nontender, nondistended.  Extremities: No cyanosis, clubbing, rash, edema Neuro: Alert & oriented x 3, moves all 4 extremities w/o difficulty. Affect pleasant.  Assessment/Plan: 1. Chronic systolic CHF: Echo in 4/20 with EF 40-45%, moderate RV dilation/mildly decreased RV function.  Nonischemic cardiomyopathy, cath in 10/19 without  significant coronary disease.  Repeat echo in 9/20 showed fall in EF to 20-25% in the setting of atrial fibrillation and atrial flutter with persistent tachycardia.  He is now s/p St Jude CRT-D and AV nodal ablation.  Echo in 10/21 with EF up to 60-65%. Echo in 7/23 showed EF 50-55%, global hypokinesis, mildly dilated and mildly dysfunctional RV, s/p MV repair with no MR and mean gradient 4, s/p aortic valve repair with mild AS mean gradient 11 and mild AI, normal IVC.  Echo in 7/24 showed EF 55%, mild LVH, mild RV enlargement with mildly decreased RV systolic function, s/p aortic valve repair with mean gradient 12, s/p mitral valve repair with mean gradient 4 mmHg, IVC normal, PASP 27 mmHg. Echo 3/25 showed EF 55-60%, stable valves. NYHA class I-II.  He is not volume overloaded on exam or by Corvue.   - Continue torsemide  20 mg daily. BMET - Continue spironolactone  25 mg daily.   - Continue Entresto  24/26 bid.    - Continue Coreg  6.25 mg bid.  - Continue Farxiga  10 mg daily.    2. Atrial arrhythmias: Long history of chronic atrial fibrillation, has had slow ventricular response in the past (HR 40s at times). He failed Tikosyn  and have avoided amiodarone  with bradycardia.  He had Maze procedure in 11/19 but it was not successful.  At initial appointment with me, he was in atypical flutter with  HR 110s.  He failed DCCV in 8/20.  He had flutter/fibrillation ablation in 10/20.  Initially after ablation, he was in slow atrial fibrillation (rate upper 30s-50s generally).  He is now s/p AV nodal ablation with CRT-D. Atrial fibrillation is now permanent.  Cardioversion was attempted in 6/23 but did not hold.  As he is BiV pacing > 99% of the time in atrial fibrillation s/p AVN ablation, I think we can hold off on trying cardioversion again.  - He will continue Xarelto . CBC today.   - He asks again about Watchman device.  He has had no GI bleeding and has no trouble getting Xarelto , so do not think he needs to go  through an invasive procedure for this.  3. OSA: Continue CPAP.  4. Valvular heart disease: S/p MV and AoV repairs in 11/19.  Valves looked stable on 7/24 echo.  - He will need antibiotic prophylaxis with dental work.  5. Ascending aortic aneurysm: 4.6 cm on 10/21 echo. CTA chest in 7/22 showed 4.1 cm ascending aorta.  6. Obesity: Body mass index is 35.56 kg/m. - He is now on Zepbound  and has lost 30 lbs. 7. ? Amyloidosis: worsening neuropathy in feet in absence of DM 2; has hx of AF, valvular disease and DDD. Will get PYP scan (not sure if CRT-D is MRI compatible) and send MM panel, immunofixation, urine IFE, kappa lambda light chain, SPEP and IFE.  Followup in 6 months with Dr. Rolan.  Harlene HERO [facility] FNP-BC 02/08/2024

## 2024-02-08 ENCOUNTER — Other Ambulatory Visit (HOSPITAL_COMMUNITY): Payer: Medicare HMO

## 2024-02-08 ENCOUNTER — Encounter (HOSPITAL_COMMUNITY): Payer: Self-pay

## 2024-02-08 ENCOUNTER — Ambulatory Visit (HOSPITAL_COMMUNITY)
Admission: RE | Admit: 2024-02-08 | Discharge: 2024-02-08 | Disposition: A | Discharge status: Home / Self Care | Payer: Medicare HMO | Source: Ambulatory Visit | Attending: Family Medicine

## 2024-02-08 VITALS — BP 110/60 | HR 62 | Ht 70.0 in | Wt 247.8 lb

## 2024-02-08 DIAGNOSIS — Z79899 Other long term (current) drug therapy: Secondary | ICD-10-CM | POA: Insufficient documentation

## 2024-02-08 DIAGNOSIS — Z6835 Body mass index (BMI) 35.0-35.9, adult: Secondary | ICD-10-CM | POA: Insufficient documentation

## 2024-02-08 DIAGNOSIS — Z7984 Long term (current) use of oral hypoglycemic drugs: Secondary | ICD-10-CM | POA: Insufficient documentation

## 2024-02-08 DIAGNOSIS — E669 Obesity, unspecified: Secondary | ICD-10-CM | POA: Diagnosis not present

## 2024-02-08 DIAGNOSIS — Z96652 Presence of left artificial knee joint: Secondary | ICD-10-CM | POA: Diagnosis not present

## 2024-02-08 DIAGNOSIS — I482 Chronic atrial fibrillation, unspecified: Secondary | ICD-10-CM | POA: Diagnosis not present

## 2024-02-08 DIAGNOSIS — Z95810 Presence of automatic (implantable) cardiac defibrillator: Secondary | ICD-10-CM | POA: Diagnosis not present

## 2024-02-08 DIAGNOSIS — Z7901 Long term (current) use of anticoagulants: Secondary | ICD-10-CM | POA: Insufficient documentation

## 2024-02-08 DIAGNOSIS — I38 Endocarditis, valve unspecified: Secondary | ICD-10-CM | POA: Diagnosis not present

## 2024-02-08 DIAGNOSIS — I5022 Chronic systolic (congestive) heart failure: Secondary | ICD-10-CM | POA: Diagnosis not present

## 2024-02-08 DIAGNOSIS — G629 Polyneuropathy, unspecified: Secondary | ICD-10-CM | POA: Diagnosis not present

## 2024-02-08 DIAGNOSIS — G4733 Obstructive sleep apnea (adult) (pediatric): Secondary | ICD-10-CM | POA: Diagnosis not present

## 2024-02-08 DIAGNOSIS — I428 Other cardiomyopathies: Secondary | ICD-10-CM | POA: Insufficient documentation

## 2024-02-08 DIAGNOSIS — Z9889 Other specified postprocedural states: Secondary | ICD-10-CM | POA: Diagnosis not present

## 2024-02-08 DIAGNOSIS — I484 Atypical atrial flutter: Secondary | ICD-10-CM | POA: Diagnosis not present

## 2024-02-08 DIAGNOSIS — E859 Amyloidosis, unspecified: Secondary | ICD-10-CM

## 2024-02-08 DIAGNOSIS — I110 Hypertensive heart disease with heart failure: Secondary | ICD-10-CM | POA: Diagnosis not present

## 2024-02-08 DIAGNOSIS — I080 Rheumatic disorders of both mitral and aortic valves: Secondary | ICD-10-CM | POA: Diagnosis not present

## 2024-02-08 DIAGNOSIS — I7121 Aneurysm of the ascending aorta, without rupture: Secondary | ICD-10-CM | POA: Insufficient documentation

## 2024-02-08 DIAGNOSIS — I4821 Permanent atrial fibrillation: Secondary | ICD-10-CM | POA: Diagnosis not present

## 2024-02-08 LAB — BASIC METABOLIC PANEL WITH GFR
Anion gap: 8 (ref 5–15)
BUN: 26 mg/dL — ABNORMAL HIGH (ref 8–23)
CO2: 25 mmol/L (ref 22–32)
Calcium: 9.9 mg/dL (ref 8.9–10.3)
Chloride: 104 mmol/L (ref 98–111)
Creatinine, Ser: 1.27 mg/dL — ABNORMAL HIGH (ref 0.61–1.24)
GFR, Estimated: >60 mL/min (ref >60)
Glucose, Bld: 97 mg/dL (ref 70–99)
Potassium: 4.1 mmol/L (ref 3.5–5.1)
Sodium: 137 mmol/L (ref 135–145)

## 2024-02-08 LAB — CBC
HCT: 46.2 % (ref 39.0–52.0)
Hemoglobin: 15.2 g/dL (ref 13.0–17.0)
MCH: 28.3 pg (ref 26.0–34.0)
MCHC: 32.9 g/dL (ref 30.0–36.0)
MCV: 86 fL (ref 80.0–100.0)
Platelets: 164 K/uL (ref 150–400)
RBC: 5.37 MIL/uL (ref 4.22–5.81)
RDW: 13.8 % (ref 11.5–15.5)
WBC: 7.8 K/uL (ref 4.0–10.5)
nRBC: 0 % (ref 0.0–0.2)

## 2024-02-08 NOTE — Patient Instructions (Addendum)
 Good to see you today!  Labs done today, your results will be available in MyChart, we will contact you for abnormal readings.  Your physician recommends that you schedule a follow-up appointment 6 months(January) Call office in November to schedule an appointment  You have been ordered a PYP Scan.  This is done in the Radiology Department of [facility].  When you come for this test please plan to be there 2-3 hours.  If you have any questions or concerns before your next appointment please send us  a message through [place] or call our office at [phone].    TO LEAVE A MESSAGE FOR THE NURSE SELECT OPTION 2, PLEASE LEAVE A MESSAGE INCLUDING: YOUR NAME DATE OF BIRTH CALL BACK NUMBER REASON FOR CALL**this is important as we prioritize the call backs  YOU WILL RECEIVE A CALL BACK THE SAME DAY AS LONG AS YOU CALL BEFORE 4:00 PM At the [facility], you and your health needs are our priority. As part of our continuing mission to provide you with exceptional heart care, we have created designated Provider Care Teams. These Care Teams include your primary Cardiologist (physician) and Advanced Practice Providers (APPs- Physician Assistants and Nurse Practitioners) who all work together to provide you with the care you need, when you need it.   You may see any of the following providers on your designated Care Team at your next follow up: Dr Toribio Fuel Dr Ezra Shuck Dr. Ria Commander Dr. Morene Brownie Amy Lenetta, NP Caffie Shed, [place] [facility], [place] Beckey Coe, NP [place] Lee, NP Ellouise Class, NP Tinnie Redman, PharmD Jaun Bash, PharmD   Please be sure to bring in all your medications bottles to every appointment.    Thank you for choosing [facility]

## 2024-02-08 NOTE — Addendum Note (Signed)
 Encounter addended by: Tymier Lindholm M, RN on: 02/08/2024 11:49 AM  Actions taken: Charge Capture section accepted

## 2024-02-09 ENCOUNTER — Telehealth (HOSPITAL_COMMUNITY): Payer: Self-pay | Admitting: Surgery

## 2024-02-09 ENCOUNTER — Ambulatory Visit (HOSPITAL_COMMUNITY): Payer: Self-pay | Admitting: Family Medicine

## 2024-02-09 LAB — KAPPA/LAMBDA LIGHT CHAINS
Kappa free light chain: 14.7 mg/L (ref 3.3–19.4)
Kappa, lambda light chain ratio: 1.08 (ref 0.26–1.65)
Lambda free light chains: 13.6 mg/L (ref 5.7–26.3)

## 2024-02-09 NOTE — Telephone Encounter (Signed)
 I attempted to reach [name].  I left a message to indicate that his urine sample that was collected yesterday while in clinic was unable to be processed.  I asked that he return a call to our clinic to schedule a time to repeat the sample.

## 2024-02-09 NOTE — Progress Notes (Signed)
 Remote ICD transmission.

## 2024-02-10 ENCOUNTER — Other Ambulatory Visit (HOSPITAL_COMMUNITY): Payer: Self-pay | Admitting: Cardiology

## 2024-02-10 DIAGNOSIS — G4733 Obstructive sleep apnea (adult) (pediatric): Secondary | ICD-10-CM | POA: Diagnosis not present

## 2024-02-10 LAB — MULTIPLE MYELOMA PANEL, SERUM
Albumin SerPl Elph-Mcnc: 4.2 g/dL (ref 2.9–4.4)
Albumin/Glob SerPl: 1.8 — ABNORMAL HIGH (ref 0.7–1.7)
Alpha 1: 0.2 g/dL (ref 0.0–0.4)
Alpha2 Glob SerPl Elph-Mcnc: 0.6 g/dL (ref 0.4–1.0)
B-Globulin SerPl Elph-Mcnc: 0.9 g/dL (ref 0.7–1.3)
Gamma Glob SerPl Elph-Mcnc: 0.7 g/dL (ref 0.4–1.8)
Globulin, Total: 2.4 g/dL (ref 2.2–3.9)
IgA: 177 mg/dL (ref 61–437)
IgG (Immunoglobin G), Serum: 798 mg/dL (ref 603–1613)
IgM (Immunoglobulin M), Srm: 11 mg/dL — ABNORMAL LOW (ref 15–143)
Total Protein ELP: 6.6 g/dL (ref 6.0–8.5)

## 2024-02-11 ENCOUNTER — Other Ambulatory Visit: Payer: Self-pay | Admitting: Family Medicine

## 2024-02-11 ENCOUNTER — Other Ambulatory Visit (HOSPITAL_COMMUNITY): Payer: Self-pay | Admitting: Cardiology

## 2024-02-12 NOTE — Telephone Encounter (Signed)
 Requested Prescriptions  Pending Prescriptions Disp Refills   colchicine  0.6 MG tablet [Pharmacy Med Name: COLCHICINE  0.6 MG TABLET] 90 tablet 1    Sig: PLEASE SEE ATTACHED FOR DETAILED DIRECTIONS     Endocrinology:  Gout Agents - colchicine  Failed - 02/12/2024  1:39 PM      Failed - Cr in normal range and within 360 days    Creat  Date Value Ref Range Status  11/02/2023 1.24 0.70 - 1.28 mg/dL Final   Creatinine, Ser  Date Value Ref Range Status  02/08/2024 1.27 (H) 0.61 - 1.24 mg/dL Final         Passed - ALT in normal range and within 360 days    ALT  Date Value Ref Range Status  11/02/2023 13 9 - 46 U/L Final         Passed - AST in normal range and within 360 days    AST  Date Value Ref Range Status  11/02/2023 13 10 - 35 U/L Final         Passed - Valid encounter within last 12 months    Recent Outpatient Visits           3 months ago Chronic congestive heart failure, unspecified heart failure type (HCC)   [place] [facility] Duanne Butler DASEN, MD   6 months ago Persistent atrial fibrillation [facility])   [place] [facility] Pickard, Butler DASEN, MD   9 months ago Acute left ankle pain   [place] [facility] Duanne, Butler DASEN, MD   10 months ago Acute left ankle pain   [place] [facility] Duanne, Butler DASEN, MD   10 months ago Persistent atrial fibrillation [facility])   [place] [facility] Pickard, Butler DASEN, MD              Passed - CBC within normal limits and completed in the last 12 months    WBC  Date Value Ref Range Status  02/08/2024 7.8 4.0 - 10.5 K/uL Final   RBC  Date Value Ref Range Status  02/08/2024 5.37 4.22 - 5.81 MIL/uL Final   Hemoglobin  Date Value Ref Range Status  02/08/2024 15.2 13.0 - 17.0 g/dL Final  87/72/7977 85.6 13.0 - 17.7 g/dL Final   Total hemoglobin  Date Value Ref Range Status  05/20/2018 10.4 (L) 12.0 - 16.0 g/dL Final   HCT   Date Value Ref Range Status  02/08/2024 46.2 39.0 - 52.0 % Final   Hematocrit  Date Value Ref Range Status  07/09/2021 41.6 37.5 - 51.0 % Final   MCHC  Date Value Ref Range Status  02/08/2024 32.9 30.0 - 36.0 g/dL Final   [facility]  Date Value Ref Range Status  02/08/2024 28.3 26.0 - 34.0 pg Final   MCV  Date Value Ref Range Status  02/08/2024 86.0 80.0 - 100.0 fL Final  07/09/2021 84 79 - 97 fL Final   No results found for: PLTCOUNTKUC, LABPLAT, POCPLA RDW  Date Value Ref Range Status  02/08/2024 13.8 11.5 - 15.5 % Final  07/09/2021 13.5 11.6 - 15.4 % Final

## 2024-02-16 ENCOUNTER — Other Ambulatory Visit: Payer: Self-pay | Admitting: Cardiovascular Disease

## 2024-02-16 ENCOUNTER — Other Ambulatory Visit: Payer: Self-pay

## 2024-02-16 ENCOUNTER — Other Ambulatory Visit (HOSPITAL_COMMUNITY): Payer: Self-pay

## 2024-02-16 DIAGNOSIS — E669 Obesity, unspecified: Secondary | ICD-10-CM

## 2024-02-16 DIAGNOSIS — G4733 Obstructive sleep apnea (adult) (pediatric): Secondary | ICD-10-CM

## 2024-02-16 MED ORDER — ZEPBOUND 7.5 MG/0.5ML ~~LOC~~ SOAJ
7.5000 mg | SUBCUTANEOUS | 0 refills | Status: DC
  Filled 2024-02-16: qty 2, 28d supply, fill #0

## 2024-02-17 ENCOUNTER — Other Ambulatory Visit (HOSPITAL_COMMUNITY): Payer: Self-pay

## 2024-02-17 ENCOUNTER — Other Ambulatory Visit (HOSPITAL_COMMUNITY): Payer: Self-pay | Admitting: Family Medicine

## 2024-02-17 DIAGNOSIS — I5022 Chronic systolic (congestive) heart failure: Secondary | ICD-10-CM | POA: Diagnosis not present

## 2024-02-18 ENCOUNTER — Other Ambulatory Visit: Payer: Self-pay

## 2024-02-18 ENCOUNTER — Other Ambulatory Visit: Payer: Self-pay | Admitting: Cardiovascular Disease

## 2024-02-18 ENCOUNTER — Other Ambulatory Visit: Payer: Self-pay | Admitting: Pharmacist

## 2024-02-18 ENCOUNTER — Encounter: Payer: Self-pay | Admitting: Cardiology

## 2024-02-18 ENCOUNTER — Other Ambulatory Visit (HOSPITAL_COMMUNITY): Payer: Self-pay

## 2024-02-18 DIAGNOSIS — E669 Obesity, unspecified: Secondary | ICD-10-CM

## 2024-02-18 DIAGNOSIS — G4733 Obstructive sleep apnea (adult) (pediatric): Secondary | ICD-10-CM

## 2024-02-19 ENCOUNTER — Other Ambulatory Visit (HOSPITAL_COMMUNITY): Payer: Self-pay

## 2024-02-20 LAB — IMMUNOFIXATION, URINE

## 2024-02-22 ENCOUNTER — Ambulatory Visit (HOSPITAL_COMMUNITY): Payer: Self-pay | Admitting: Family Medicine

## 2024-02-22 ENCOUNTER — Other Ambulatory Visit (HOSPITAL_COMMUNITY): Payer: Self-pay

## 2024-02-22 ENCOUNTER — Other Ambulatory Visit: Payer: Self-pay | Admitting: Family Medicine

## 2024-02-22 DIAGNOSIS — E669 Obesity, unspecified: Secondary | ICD-10-CM

## 2024-02-22 DIAGNOSIS — G4733 Obstructive sleep apnea (adult) (pediatric): Secondary | ICD-10-CM

## 2024-02-23 ENCOUNTER — Telehealth (HOSPITAL_COMMUNITY): Payer: Self-pay

## 2024-02-23 ENCOUNTER — Ambulatory Visit: Attending: Cardiology

## 2024-02-23 DIAGNOSIS — Z95810 Presence of automatic (implantable) cardiac defibrillator: Secondary | ICD-10-CM | POA: Diagnosis not present

## 2024-02-23 DIAGNOSIS — I5022 Chronic systolic (congestive) heart failure: Secondary | ICD-10-CM | POA: Diagnosis not present

## 2024-02-23 NOTE — Progress Notes (Signed)
 EPIC Encounter for ICM Monitoring  Patient Name: [name] is a 73 y.o. male Date: 02/23/2024 Primary Care Physican: Duanne Butler DASEN, MD Primary Cardiologist: Rolan Electrophysiologist: Cindie Pore Pacing: >99%        09/12/2022 Office Weight: 285 lbs 02/04/2023 Weight: 278 lbs 05/29/2023 Weight: 269 lbs 08/20/2023 Weight: 269 lbs 11/04/2023 Weight: 247 lbs 02/23/2024 Weight: 240 lbs                                                          AT/AF Burden: 100% (taking Xarelto )    Spoke with patient and heart failure questions reviewed.  Transmission results reviewed.  Pt asymptomatic for fluid accumulation.  Reports feeling well at this time and voices no complaints.     CorVue thoracic impedance suggesting normal fluid levels for past 2 months with exception of possible fluid accumulation from 6/20-6/26, 7/13-7/16 and 7/27-7/31.     Prescribed: Torsemide  20 mg take 1 tablet (20 mg total) daily.  He self adjusts Torsemide  if needed.  Klor Con 20 mEq take take 1 tablet daily  Spironolactone  25 mg take 1 tablet daily   Labs: 02/08/2024 Creatinine 1.27, BUN 26, Potassium 4.1, Sodium 137, GFR >60 11/02/2023 Creatinine 1.24, BUN 26, Potassium 5.2, Sodium 139 10/27/2023 Creatinine 1.44, BUN 27, Potassium 3.3, Sodium 135, GFR 52  A complete set of results can be found in Results Review.   Recommendations:  No changes and encouraged to call if experiencing any fluid symptoms.   Follow-up plan: ICM clinic phone appointment on 05/09/2024 (pt prefers every 2 months).  91 day device clinic remote transmission 03/15/2024.     EP/Cardiology Office Visits:  Recall 08/06/2024 with Dr Rolan.  Recall 09/08/2024 with Dr Cindie or EP APP.    Copy of ICM check sent to Dr. Cindie.     3 month ICM trend: 02/23/2024.    12-14 Month ICM trend:     [name], RN 02/23/2024 7:22 AM

## 2024-02-23 NOTE — Telephone Encounter (Signed)
 Detailed instructions left on the patient's answering machine. [name] CCT

## 2024-02-25 ENCOUNTER — Telehealth: Payer: Self-pay

## 2024-02-25 ENCOUNTER — Other Ambulatory Visit: Payer: Self-pay | Admitting: Family Medicine

## 2024-02-25 ENCOUNTER — Other Ambulatory Visit (HOSPITAL_BASED_OUTPATIENT_CLINIC_OR_DEPARTMENT_OTHER): Payer: Self-pay

## 2024-02-25 ENCOUNTER — Other Ambulatory Visit: Payer: Self-pay

## 2024-02-25 ENCOUNTER — Other Ambulatory Visit (HOSPITAL_COMMUNITY): Payer: Self-pay

## 2024-02-25 DIAGNOSIS — G4733 Obstructive sleep apnea (adult) (pediatric): Secondary | ICD-10-CM

## 2024-02-25 DIAGNOSIS — E669 Obesity, unspecified: Secondary | ICD-10-CM

## 2024-02-25 MED ORDER — ZEPBOUND 2.5 MG/0.5ML ~~LOC~~ SOAJ
2.5000 mg | SUBCUTANEOUS | 11 refills | Status: DC
  Filled 2024-02-25: qty 2, 28d supply, fill #0
  Filled 2024-03-09 – 2024-03-12 (×4): qty 2, 28d supply, fill #1
  Filled 2024-04-07: qty 2, 28d supply, fill #2

## 2024-02-25 MED ORDER — TIRZEPATIDE-WEIGHT MANAGEMENT 2.5 MG/0.5ML ~~LOC~~ SOLN: 2.5000 mg | SUBCUTANEOUS | 11 refills | Status: DC

## 2024-02-25 NOTE — Telephone Encounter (Signed)
 Copied from CRM #[id number]. Topic: Clinical - Medication Question >> Feb 25, 2024  9:50 AM Leonette SQUIBB wrote: Reason for CRM: pt called saying he needs to take the 2.5 of the Zepbound .  Zepbound  7.5 does not work for him.  He says he can not even take the 5.  He is completely ot of the Zepbound  right now  CB#  [phone]

## 2024-02-29 ENCOUNTER — Other Ambulatory Visit (HOSPITAL_COMMUNITY): Payer: Self-pay | Admitting: Family Medicine

## 2024-02-29 DIAGNOSIS — I5022 Chronic systolic (congestive) heart failure: Secondary | ICD-10-CM

## 2024-02-29 NOTE — Telephone Encounter (Signed)
 Pharmacy comment: Alternative Requested:WE DO NOT CARRY VIALS OF ZEPBOUND .  PLEASE SEND RX FOR PENS.

## 2024-03-01 ENCOUNTER — Ambulatory Visit (HOSPITAL_COMMUNITY)
Admission: RE | Admit: 2024-03-01 | Discharge: 2024-03-01 | Disposition: A | Discharge status: Home / Self Care | Source: Ambulatory Visit | Attending: Internal Medicine | Admitting: Internal Medicine

## 2024-03-01 DIAGNOSIS — I5022 Chronic systolic (congestive) heart failure: Secondary | ICD-10-CM | POA: Diagnosis not present

## 2024-03-01 LAB — MYOCARDIAL AMYLOID PLANAR & SPECT: H/CL Ratio: 1.18

## 2024-03-01 MED ORDER — TECHNETIUM TC 99M TETROFOSMIN IV KIT
21.6000 | PACK | Freq: Once | INTRAVENOUS | Status: AC | PRN
  Administered 2024-03-01: 21.6 via INTRAVENOUS

## 2024-03-08 ENCOUNTER — Ambulatory Visit (HOSPITAL_COMMUNITY): Payer: Self-pay | Admitting: Family Medicine

## 2024-03-08 ENCOUNTER — Other Ambulatory Visit: Payer: Self-pay | Admitting: Family Medicine

## 2024-03-08 NOTE — Telephone Encounter (Unsigned)
 Copied from CRM #[id number]. Topic: Clinical - Medication Refill >> Mar 08, 2024 11:49 AM Berwyn MATSU wrote: Medication: tadalafil  (CIALIS ) 10 MG tablet  Has the patient contacted their pharmacy? Yes (Agent: If no, request that the patient contact the pharmacy for the refill. If patient does not wish to contact the pharmacy document the reason why and proceed with request.) (Agent: If yes, when and what did the pharmacy advise?)  This is the patient's preferred pharmacy:  [id number] - [place], [place] - [address] [place] [address] Phone: [phone] Fax: [phone]  [facility] [address] [place] [address] Phone: [phone] Fax: [phone]  Is this the correct pharmacy for this prescription? Yes If no, delete pharmacy and type the correct one.   Has the prescription been filled recently? NO  Is the patient out of the medication? Yes  Has the patient been seen for an appointment in the last year OR does the patient have an upcoming appointment? Yes  Can we respond through MyChart? Yes  Agent: Please be advised that Rx refills may take up to 3 business days. We ask that you follow-up with your pharmacy.

## 2024-03-09 ENCOUNTER — Other Ambulatory Visit (HOSPITAL_COMMUNITY): Payer: Self-pay | Admitting: Cardiology

## 2024-03-09 ENCOUNTER — Telehealth: Payer: Self-pay

## 2024-03-09 ENCOUNTER — Other Ambulatory Visit (HOSPITAL_COMMUNITY): Payer: Self-pay

## 2024-03-09 DIAGNOSIS — I5022 Chronic systolic (congestive) heart failure: Secondary | ICD-10-CM

## 2024-03-09 DIAGNOSIS — I484 Atypical atrial flutter: Secondary | ICD-10-CM

## 2024-03-09 NOTE — Telephone Encounter (Signed)
 Prescription Request  03/09/2024  LOV: 11/02/23  What is the name of the medication or equipment? tadalafil  (CIALIS ) 10 MG tablet [[id number]]  Have you contacted your pharmacy to request a refill? Yes   Which pharmacy would you like this sent to?  [id number] - [place],  - [address] [place] [address] Phone: [phone] Fax: [phone]    Patient notified that their request is being sent to the clinical staff for review and that they should receive a response within 2 business days.   Please advise at [facility] [phone]

## 2024-03-10 ENCOUNTER — Other Ambulatory Visit: Payer: Self-pay | Admitting: Family Medicine

## 2024-03-10 MED ORDER — TADALAFIL 10 MG PO TABS: 10.0000 mg | ORAL_TABLET | Freq: Every day | ORAL | 3 refills | Status: AC | PRN

## 2024-03-10 NOTE — Telephone Encounter (Signed)
 Requested medications are due for refill today.  unsure  Requested medications are on the active medications list.  yes  Last refill. 10/16/2020  Future visit scheduled.   no  Notes to clinic.  Medication is historical    Requested Prescriptions  Pending Prescriptions Disp Refills   tadalafil  (CIALIS ) 10 MG tablet 10 tablet     Sig: Take 1 tablet (10 mg total) by mouth daily as needed for erectile dysfunction.     Urology: Erectile Dysfunction Agents Passed - 03/10/2024  9:49 AM      Passed - AST in normal range and within 360 days    AST  Date Value Ref Range Status  11/02/2023 13 10 - 35 U/L Final         Passed - ALT in normal range and within 360 days    ALT  Date Value Ref Range Status  11/02/2023 13 9 - 46 U/L Final         Passed - Last BP in normal range    BP Readings from Last 1 Encounters:  02/08/24 110/60         Passed - Valid encounter within last 12 months    Recent Outpatient Visits           4 months ago Chronic congestive heart failure, unspecified heart failure type [place])   [place] [facility] Duanne Butler DASEN, MD   7 months ago Persistent atrial fibrillation [facility])   [place] [facility] Duanne Butler DASEN, MD   10 months ago Acute left ankle pain   [place] [facility] Duanne Butler DASEN, MD   10 months ago Acute left ankle pain   [place] [facility] Duanne Butler DASEN, MD   11 months ago Persistent atrial fibrillation [facility])    [facility] Pickard, Butler DASEN, MD

## 2024-03-12 ENCOUNTER — Other Ambulatory Visit (HOSPITAL_COMMUNITY): Payer: Self-pay

## 2024-03-15 ENCOUNTER — Ambulatory Visit (INDEPENDENT_AMBULATORY_CARE_PROVIDER_SITE_OTHER): Payer: Medicare HMO

## 2024-03-15 DIAGNOSIS — I5022 Chronic systolic (congestive) heart failure: Secondary | ICD-10-CM | POA: Diagnosis not present

## 2024-03-17 LAB — CUP PACEART REMOTE DEVICE CHECK
Battery Remaining Longevity: 35 mo
Battery Remaining Percentage: 37 %
Battery Voltage: 2.93 V
Date Time Interrogation Session: 20250902020320
HighPow Impedance: 87 Ohm
Implantable Lead Connection Status: 753985
Implantable Lead Connection Status: 753985
Implantable Lead Connection Status: 753985
Implantable Lead Implant Date: 20210209
Implantable Lead Implant Date: 20210209
Implantable Lead Implant Date: 20210209
Implantable Lead Location: 753858
Implantable Lead Location: 753859
Implantable Lead Location: 753860
Implantable Pulse Generator Implant Date: 20210209
Lead Channel Impedance Value: 440 Ohm
Lead Channel Impedance Value: 460 Ohm
Lead Channel Impedance Value: 880 Ohm
Lead Channel Pacing Threshold Amplitude: 1 V
Lead Channel Pacing Threshold Amplitude: 1.5 V
Lead Channel Pacing Threshold Pulse Width: 0.5 ms
Lead Channel Pacing Threshold Pulse Width: 0.5 ms
Lead Channel Sensing Intrinsic Amplitude: 0.4 mV
Lead Channel Sensing Intrinsic Amplitude: 11 mV
Lead Channel Setting Pacing Amplitude: 2.5 V
Lead Channel Setting Pacing Amplitude: 2.5 V
Lead Channel Setting Pacing Pulse Width: 0.5 ms
Lead Channel Setting Pacing Pulse Width: 0.5 ms
Lead Channel Setting Sensing Sensitivity: 0.5 mV
Pulse Gen Serial Number: 111006442
Zone Setting Status: 755011

## 2024-03-19 ENCOUNTER — Ambulatory Visit: Payer: Self-pay | Admitting: Cardiology

## 2024-03-22 ENCOUNTER — Other Ambulatory Visit (HOSPITAL_COMMUNITY): Payer: Self-pay

## 2024-03-22 NOTE — Progress Notes (Signed)
Remote ICD Transmission.

## 2024-04-07 ENCOUNTER — Other Ambulatory Visit (HOSPITAL_COMMUNITY): Payer: Self-pay

## 2024-05-03 ENCOUNTER — Other Ambulatory Visit: Payer: Self-pay | Admitting: Cardiology

## 2024-05-04 ENCOUNTER — Other Ambulatory Visit (HOSPITAL_COMMUNITY): Payer: Self-pay

## 2024-05-04 ENCOUNTER — Other Ambulatory Visit: Payer: Self-pay | Admitting: Family Medicine

## 2024-05-04 DIAGNOSIS — E669 Obesity, unspecified: Secondary | ICD-10-CM

## 2024-05-04 DIAGNOSIS — G4733 Obstructive sleep apnea (adult) (pediatric): Secondary | ICD-10-CM

## 2024-05-05 ENCOUNTER — Other Ambulatory Visit (HOSPITAL_COMMUNITY): Payer: Self-pay

## 2024-05-09 ENCOUNTER — Ambulatory Visit: Attending: Cardiology

## 2024-05-09 DIAGNOSIS — I5022 Chronic systolic (congestive) heart failure: Secondary | ICD-10-CM

## 2024-05-09 DIAGNOSIS — Z95810 Presence of automatic (implantable) cardiac defibrillator: Secondary | ICD-10-CM

## 2024-05-10 NOTE — Progress Notes (Signed)
 EPIC Encounter for ICM Monitoring  Patient Name: [name] is a 73 y.o. male Date: 05/10/2024 Primary Care Physican: Duanne Butler DASEN, MD Primary Cardiologist: Rolan Electrophysiologist: Cindie Pore Pacing: >99%        09/12/2022 Office Weight: 285 lbs 02/04/2023 Weight: 278 lbs 05/29/2023 Weight: 269 lbs 08/20/2023 Weight: 269 lbs 11/04/2023 Weight: 247 lbs 02/23/2024 Weight: 240 lbs 05/10/2024 Weight: 237 lbs                                                          AT/AF Burden: 100% (taking Xarelto )    Spoke with patient and heart failure questions reviewed.  Transmission results reviewed.  Pt asymptomatic for fluid accumulation.  He was on vacation from 03/19/2024-04/02/2024 that correlates with decreased impedance.  He has been having back pain and neuropathy pain lately.     Since 02/23/2024 ICM Remote Transmission: CorVue thoracic impedance suggesting possible fluid accumulation from 03/22/2024-04/06/2024 and 04/26/2024-05/06/2024 and possible dryness 04/08/2024-04/24/2024.     Prescribed: Torsemide  20 mg take 1 tablet (20 mg total) daily.  He self adjusts Torsemide  if needed.  Klor Con 20 mEq take take 1 tablet daily  Spironolactone  25 mg take 1 tablet daily   Labs: 02/08/2024 Creatinine 1.27, BUN 26, Potassium 4.1, Sodium 137, GFR >60 11/02/2023 Creatinine 1.24, BUN 26, Potassium 5.2, Sodium 139 10/27/2023 Creatinine 1.44, BUN 27, Potassium 3.3, Sodium 135, GFR 52  A complete set of results can be found in Results Review.   Recommendations:  Encouraged to be consistent with fluid intake of 64 oz daily and limit salt intake.  No changes and encouraged to call if experiencing any fluid symptoms.   Follow-up plan: ICM clinic phone appointment on 07/11/2024 (pt prefers every 2 months).  91 day device clinic remote transmission 06/14/2024.     EP/Cardiology Office Visits:  Recall 08/06/2024 with Dr Rolan.  Recall 09/08/2024 with Dr Cindie or EP APP.    Copy of ICM check sent to  Dr. Cindie.    Remote monitoring is medically necessary for Heart Failure Management.    Daily Thoracic Impedance ICM trend: 02/09/2024 through 05/09/2024.    12-14 Month Thoracic Impedance ICM trend:     [name], RN 05/10/2024 3:47 PM

## 2024-05-11 ENCOUNTER — Other Ambulatory Visit: Payer: Self-pay | Admitting: Family Medicine

## 2024-05-11 ENCOUNTER — Other Ambulatory Visit: Payer: Self-pay

## 2024-05-11 ENCOUNTER — Other Ambulatory Visit (HOSPITAL_COMMUNITY): Payer: Self-pay

## 2024-05-11 ENCOUNTER — Telehealth: Payer: Self-pay

## 2024-05-11 DIAGNOSIS — E669 Obesity, unspecified: Secondary | ICD-10-CM

## 2024-05-11 DIAGNOSIS — G4733 Obstructive sleep apnea (adult) (pediatric): Secondary | ICD-10-CM

## 2024-05-11 DIAGNOSIS — I5022 Chronic systolic (congestive) heart failure: Secondary | ICD-10-CM

## 2024-05-11 MED ORDER — ZEPBOUND 5 MG/0.5ML ~~LOC~~ SOAJ
5.0000 mg | SUBCUTANEOUS | 0 refills | Status: DC
  Filled 2024-05-11: qty 2, 28d supply, fill #0

## 2024-05-11 NOTE — Telephone Encounter (Signed)
 Copied from CRM [id number]. Topic: Clinical - Medication Question >> May 11, 2024  3:17 PM Selinda RAMAN wrote: Reason for CRM: The patient called in stating his pharmacy told him to contact his provider regarding a refill for his Zepbound . The previous request was sent in stating refill not appropriate and I looked and there were 11 refills put on by the provider back in August and I told him that maybe why. He stated he was on the 2.5 and wants to move to the 5 now. I spoke with Delon and she is going to pass this message on to Ronal Bradley and have her get back with the patient. He states he is good for about another week but wanted to have everything lined up so there are no problems. Please assist patient further.

## 2024-05-12 ENCOUNTER — Telehealth (HOSPITAL_COMMUNITY): Payer: Self-pay | Admitting: Cardiology

## 2024-05-12 ENCOUNTER — Other Ambulatory Visit: Payer: Self-pay

## 2024-05-12 ENCOUNTER — Other Ambulatory Visit (HOSPITAL_COMMUNITY): Payer: Self-pay

## 2024-05-12 NOTE — Telephone Encounter (Signed)
 OK for this treatment if we can find the form.

## 2024-05-12 NOTE — Telephone Encounter (Signed)
 Patient called to request an update on a form. Reports form was originally faxed 02/2024 and again 04/28/2024  \ Forms needs ok from Dr Rolan, for patient to use diabetic neuropathy equipment on feet (electronic stimulator-similar to TENS), since patient has cardiac history and pacemaker.   Pt reports form was not sent to EP provider only Dr Rolan Certain forms not available at this time, will send message to provider and verbally call office. [facility] Dr Micky [phone]

## 2024-05-13 NOTE — Telephone Encounter (Signed)
 Detailed message left for office

## 2024-05-24 NOTE — Progress Notes (Signed)
 Fax received to device clinic on 05/19/2024 requesting cardiac clearance for use of [employer] Re[employer] 300 on Pt's feet and legs to treat peripheral neuropathy.  Fax sent by Dr. Micky.  Reviewed with Dorise with [facility].  Per Dorise ok to proceed with therapy.  Reviewed with Jodie Passey.  Letter created and signed.  Letter faxed to Dr. Micky as requested.  Will have original fax scanned to Pt's chart.

## 2024-05-31 ENCOUNTER — Other Ambulatory Visit: Payer: Self-pay | Admitting: Family Medicine

## 2024-05-31 ENCOUNTER — Other Ambulatory Visit (HOSPITAL_COMMUNITY): Payer: Self-pay

## 2024-05-31 DIAGNOSIS — G4733 Obstructive sleep apnea (adult) (pediatric): Secondary | ICD-10-CM

## 2024-05-31 DIAGNOSIS — E669 Obesity, unspecified: Secondary | ICD-10-CM

## 2024-05-31 DIAGNOSIS — I5022 Chronic systolic (congestive) heart failure: Secondary | ICD-10-CM

## 2024-06-01 ENCOUNTER — Other Ambulatory Visit: Payer: Self-pay | Admitting: Family Medicine

## 2024-06-01 DIAGNOSIS — M25561 Pain in right knee: Secondary | ICD-10-CM | POA: Diagnosis not present

## 2024-06-01 DIAGNOSIS — M7651 Patellar tendinitis, right knee: Secondary | ICD-10-CM | POA: Diagnosis not present

## 2024-06-01 NOTE — Telephone Encounter (Signed)
 Copied from CRM [id number]. Topic: Clinical - Medication Refill >> Jun 01, 2024 11:09 AM [name] wrote: Medication: HYDROcodone -acetaminophen  (NORCO/VICODIN) 5-325 MG tablet   Has the patient contacted their pharmacy? Yes (Agent: If no, request that the patient contact the pharmacy for the refill. If patient does not wish to contact the pharmacy document the reason why and proceed with request.) (Agent: If yes, when and what did the pharmacy advise?)  This is the patient's preferred pharmacy:  [id number] - [place], [place] - [address] [place] [address] Phone: [phone] Fax: [phone]  Is this the correct pharmacy for this prescription? Yes If no, delete pharmacy and type the correct one.   Has the prescription been filled recently? No  Is the patient out of the medication? Yes  Has the patient been seen for an appointment in the last year OR does the patient have an upcoming appointment? Yes  Can we respond through MyChart? Yes  Agent: Please be advised that Rx refills may take up to 3 business days. We ask that you follow-up with your pharmacy.

## 2024-06-02 ENCOUNTER — Other Ambulatory Visit (HOSPITAL_COMMUNITY): Payer: Self-pay

## 2024-06-02 ENCOUNTER — Other Ambulatory Visit: Payer: Self-pay

## 2024-06-02 ENCOUNTER — Other Ambulatory Visit: Payer: Self-pay | Admitting: Family Medicine

## 2024-06-02 DIAGNOSIS — G4733 Obstructive sleep apnea (adult) (pediatric): Secondary | ICD-10-CM

## 2024-06-02 DIAGNOSIS — E669 Obesity, unspecified: Secondary | ICD-10-CM

## 2024-06-02 DIAGNOSIS — I5022 Chronic systolic (congestive) heart failure: Secondary | ICD-10-CM

## 2024-06-02 MED ORDER — ZEPBOUND 5 MG/0.5ML ~~LOC~~ SOAJ
5.0000 mg | SUBCUTANEOUS | 3 refills | Status: DC
  Filled 2024-06-02: qty 2, 28d supply, fill #0

## 2024-06-03 ENCOUNTER — Other Ambulatory Visit: Payer: Self-pay

## 2024-06-03 MED ORDER — HYDROCODONE-ACETAMINOPHEN 5-325 MG PO TABS: 1.0000 | ORAL_TABLET | Freq: Four times a day (QID) | ORAL | 0 refills | Status: AC | PRN

## 2024-06-03 NOTE — Telephone Encounter (Signed)
 Requested medications are due for refill today.  yes  Requested medications are on the active medications list.  yes  Last refill. 01/15/2024 #30 0 rf  Future visit scheduled.   yes  Notes to clinic.  Refill not delegated.    Requested Prescriptions  Pending Prescriptions Disp Refills   HYDROcodone -acetaminophen  (NORCO/VICODIN) 5-325 MG tablet 30 tablet 0    Sig: Take 1 tablet by mouth every 6 (six) hours as needed for moderate pain (pain score 4-6).     Not Delegated - Analgesics:  Opioid Agonist Combinations Failed - 06/03/2024  2:20 PM      Failed - This refill cannot be delegated      Failed - Urine Drug Screen completed in last 360 days      Failed - Valid encounter within last 3 months    Recent Outpatient Visits           7 months ago Chronic congestive heart failure, unspecified heart failure type [facility])   [place] [facility] Duanne Butler DASEN, MD   10 months ago Persistent atrial fibrillation [facility])   [place] [facility] Duanne Butler DASEN, MD   1 year ago Acute left ankle pain   [place] [facility] Duanne Butler DASEN, MD   1 year ago Acute left ankle pain   [place] [facility] Duanne Butler DASEN, MD   1 year ago Persistent atrial fibrillation [facility])   [place] [facility] Pickard, Butler DASEN, MD

## 2024-06-14 ENCOUNTER — Ambulatory Visit: Payer: Medicare HMO

## 2024-06-14 DIAGNOSIS — I5022 Chronic systolic (congestive) heart failure: Secondary | ICD-10-CM | POA: Diagnosis not present

## 2024-06-15 LAB — CUP PACEART REMOTE DEVICE CHECK
Battery Remaining Longevity: 31 mo
Battery Remaining Percentage: 34 %
Battery Voltage: 2.92 V
Date Time Interrogation Session: 20251202021811
HighPow Impedance: 87 Ohm
Implantable Lead Connection Status: 753985
Implantable Lead Connection Status: 753985
Implantable Lead Connection Status: 753985
Implantable Lead Implant Date: 20210209
Implantable Lead Implant Date: 20210209
Implantable Lead Implant Date: 20210209
Implantable Lead Location: 753858
Implantable Lead Location: 753859
Implantable Lead Location: 753860
Implantable Pulse Generator Implant Date: 20210209
Lead Channel Impedance Value: 400 Ohm
Lead Channel Impedance Value: 440 Ohm
Lead Channel Impedance Value: 800 Ohm
Lead Channel Pacing Threshold Amplitude: 1 V
Lead Channel Pacing Threshold Amplitude: 1.5 V
Lead Channel Pacing Threshold Pulse Width: 0.5 ms
Lead Channel Pacing Threshold Pulse Width: 0.5 ms
Lead Channel Sensing Intrinsic Amplitude: 0.4 mV
Lead Channel Sensing Intrinsic Amplitude: 12 mV
Lead Channel Setting Pacing Amplitude: 2.5 V
Lead Channel Setting Pacing Amplitude: 2.5 V
Lead Channel Setting Pacing Pulse Width: 0.5 ms
Lead Channel Setting Pacing Pulse Width: 0.5 ms
Lead Channel Setting Sensing Sensitivity: 0.5 mV
Pulse Gen Serial Number: 111006442
Zone Setting Status: 755011

## 2024-06-16 ENCOUNTER — Encounter: Payer: Self-pay | Admitting: Neurology

## 2024-06-16 ENCOUNTER — Ambulatory Visit: Admitting: Family Medicine

## 2024-06-16 ENCOUNTER — Encounter: Payer: Self-pay | Admitting: Family Medicine

## 2024-06-16 VITALS — BP 120/70 | HR 60 | Temp 98.3°F | Ht 70.0 in | Wt 238.2 lb

## 2024-06-16 DIAGNOSIS — G609 Hereditary and idiopathic neuropathy, unspecified: Secondary | ICD-10-CM

## 2024-06-16 NOTE — Progress Notes (Signed)
 Subjective:    Patient ID: [name], male    DOB: 04/23/1951, 73 y.o.   MRN: [id number]  Patient has been battling peripheral neuropathy.  He recently met with a chiropractor who offered him a cure for roughly $8000 with the treatments.  He wanted my opinion on this.  He is would also like to get a second opinion with a neurologist.  At the present time he is taking gabapentin  300 mg twice daily.  However he has not seen significant benefit.  His most recent B12 was normal when checked in November of last year.  His thyroid  has been normal but this is been several years since we checked that.  He reports burning stinging tingling pain in both legs distal to his knees.  This can keep him awake at night.  He also reports numbness and decreased sensation in both legs especially on the plantar surfaces of his feet. Past Medical History:  Diagnosis Date   AICD (automatic cardioverter/defibrillator) present    Aortic insufficiency 04/21/2018   Aortic insufficiency    Arthritis    joints (09/26/2013)   Atrial enlargement, left    CHF (congestive heart failure) (HCC)    Dysrhythmia    A. fib   Essential hypertension 11/28/2020   Hearing aid worn    both ears   Mitral regurgitation 04/21/2018   Neuromuscular disorder (HCC)    Neuropathy in both feet   Obesity    OSA on CPAP    mask adjusts to what I need (09/26/2013)   Persistent atrial fibrillation (HCC)    cardioversion 06/06/13   PONV (postoperative nausea and vomiting)    Rotator cuff tear, right 06/2011   physical therapy done, decreased strength   S/P aortic valve repair 05/18/2018   Complex valvuloplasty including plication of right coronary leaflet and 21 mm Biostable HAART annuloplasty ring   S/P Maze operation for atrial fibrillation 05/18/2018   Complete bilateral atrial lesion set using bipolar radiofrequency and cryothermy ablation with clipping of LA appendage   S/P mitral valve repair 05/18/2018   Complex  valvuloplasty including artificial Gore-tex neochord placement x4 and 30 mm Sorin Memo 4D ring annuloplasty   Wears glasses    Past Surgical History:  Procedure Laterality Date   AORTIC VALVE REPAIR N/A 05/18/2018   Procedure: AORTIC VALVE REPAIR using HAART 300 Aortic Annuloplasty Device size 21mm;  Surgeon: Dusty Sudie DEL, MD;  [facility]: [facility] OR;  Service: Open Heart Surgery;  Laterality: N/A;   ATRIAL FIBRILLATION ABLATION N/A 04/26/2019   Procedure: ATRIAL FIBRILLATION ABLATION;  Surgeon: Kelsie Agent, MD;  Location: [facility] INVASIVE CV LAB;  Service: Cardiovascular;  Laterality: N/A;   AV NODE ABLATION N/A 12/20/2019   Procedure: AV NODE ABLATION;  Surgeon: Kelsie Agent, MD;  [facility]: [facility] INVASIVE CV LAB;  Service: Cardiovascular;  Laterality: N/A;   BIV ICD INSERTION CRT-D N/A 08/23/2019   Procedure: BIV ICD INSERTION CRT-D;  Surgeon: Kelsie Agent, MD;  [facility]: [facility] INVASIVE CV LAB;  Service: Cardiovascular;  Laterality: N/A;   CARDIOVERSION N/A 06/06/2013   Procedure: CARDIOVERSION;  Surgeon: Jerel Balding, MD;  Location: [facility] ENDOSCOPY;  Service: Cardiovascular;  Laterality: N/A;   CARDIOVERSION N/A 09/28/2013   Procedure: CARDIOVERSION BEDSIDE;  Surgeon: Peter M Jordan, MD;  Location: [facility] OR;  Service: Cardiovascular;  Laterality: N/A;   CARDIOVERSION N/A 11/30/2018   Procedure: CARDIOVERSION;  Surgeon: Maranda Leim DEL, MD;  Location: [facility] ENDOSCOPY;  Service: Cardiovascular;  Laterality: N/A;   CARDIOVERSION N/A 03/10/2019  Procedure: CARDIOVERSION;  Surgeon: Kate Lonni CROME, MD;  Location: [facility] ENDOSCOPY;  Service: Endoscopy;  Laterality: N/A;   CARDIOVERSION N/A 12/20/2021   Procedure: CARDIOVERSION;  Surgeon: Rolan Ezra RAMAN, MD;  Location: [facility] ENDOSCOPY;  Service: Cardiovascular;  Laterality: N/A;   CARPAL TUNNEL RELEASE Right 08/08/2013   Procedure: RIGHT CARPAL TUNNEL RELEASE;  Surgeon: Arley JONELLE Curia, MD;  Location: [facility];  Service: Orthopedics;   Laterality: Right;   CARPAL TUNNEL RELEASE Left 07/14/2006   CLIPPING OF ATRIAL APPENDAGE  05/18/2018   Procedure: CLIPPING OF ATRIAL APPENDAGE using AtriCure Clip size 45;  Surgeon: Dusty Sudie DEL, MD;  Location: [facility] OR;  Service: Open Heart Surgery;;   KNEE ARTHROSCOPY Right 03/14/1989   MAZE N/A 05/18/2018   Procedure: MAZE;  Surgeon: Dusty Sudie DEL, MD;  Location: [facility] OR;  Service: Open Heart Surgery;  Laterality: N/A;   MITRAL VALVE REPAIR N/A 05/18/2018   Procedure: MITRAL VALVE REPAIR (MVR) using 4D Memo Ring size 30;  Surgeon: Dusty Sudie DEL, MD;  Location: [facility] OR;  Service: Open Heart Surgery;  Laterality: N/A;   PACEMAKER INSERTION     RIGHT/LEFT HEART CATH AND CORONARY ANGIOGRAPHY N/A 04/21/2018   Procedure: RIGHT/LEFT HEART CATH AND CORONARY ANGIOGRAPHY;  Surgeon: Mady Lonni, MD;  [facility] INVAS[facility] CV LAB;  Service: Cardiovascular;  Laterality: N/A;   SHOULDER OPEN ROTATOR CUFF REPAIR Left 03/14/1989   SHOULDER OPEN ROTATOR CUFF REPAIR Right 11/12/2007   TEE WITHOUT CARDIOVERSION N/A 04/21/2018   Procedure: TRANSESOPHAGEAL ECHOCARDIOGRAM (TEE);  Surgeon: Jeffrie Oneil BROCKS, MD;  Location: [facility] ENDOSCOPY;  Service: Cardiovascular;  Laterality: N/A;   TEE WITHOUT CARDIOVERSION N/A 05/18/2018   Procedure: TRANSESOPHAGEAL ECHOCARDIOGRAM (TEE);  Surgeon: Dusty Sudie DEL, MD;  [facility]: [facility] OR;  Service: Open Heart Surgery;  Laterality: N/A;   TONSILLECTOMY  1950's   TOTAL HIP ARTHROPLASTY Left 07/14/2008   TOTAL HIP ARTHROPLASTY Right 05/29/2020   Procedure: TOTAL HIP ARTHROPLASTY ANTERIOR APPROACH;  Surgeon: Beverley Evalene BIRCH, MD;  Location: [facility] ORS;  Service: Orthopedics;  Laterality: Right;   TOTAL HIP REVISION Left 10/08/2011   TOTAL HIP REVISION  04/09/2012   Procedure: TOTAL HIP REVISION;  Surgeon: Dempsey LULLA Moan, MD;  Location: [facility] ORS;  Service: Orthopedics;  Laterality: Left;  Left Hip Acetabular Revision vs Constrained Liner   TOTAL KNEE ARTHROPLASTY Right 07/14/2004    TOTAL KNEE ARTHROPLASTY Left 10/13/2023   Procedure: ARTHROPLASTY, KNEE, TOTAL;  Surgeon: Beverley Evalene BIRCH, MD;  Location: [facility] ORS;  Service: Orthopedics;  Laterality: Left;   TRIGGER FINGER RELEASE Right 08/08/2013   Procedure: RELEASE STENOSING TENOSYNOVITIS RIGHT THUMB;  Surgeon: Arley JONELLE Curia, MD;  Location: [facility];  Service: Orthopedics;  Laterality: Right;   Current Outpatient Medications on File Prior to Visit  Medication Sig Dispense Refill   acetaminophen  (TYLENOL ) 500 MG tablet Take 1 tablet (500 mg total) by mouth every 6 (six) hours as needed for mild pain (pain score 1-3) or moderate pain (pain score 4-6). 30 tablet 0   carvedilol  (COREG ) 6.25 MG tablet TAKE 1 TABLET BY MOUTH 2 TIMES DAILY WITH A MEAL. 180 tablet 3   colchicine  0.6 MG tablet PLEASE SEE ATTACHED FOR DETAILED DIRECTIONS 90 tablet 1   diclofenac Sodium (VOLTAREN) 1 % GEL Apply 2 g topically daily as needed (pain).      FARXIGA  10 MG TABS tablet TAKE 1 TABLET BY MOUTH DAILY BEFORE BREAKFAST. 30 tablet 11   gabapentin  (NEURONTIN ) 300 MG capsule Take 1 capsule (  300 mg total) by mouth 3 (three) times daily. 90 capsule 3   HYDROcodone -acetaminophen  (NORCO/VICODIN) 5-325 MG tablet Take 1 tablet by mouth every 6 (six) hours as needed for moderate pain (pain score 4-6). 30 tablet 0   KLOR-CON  M20 20 MEQ tablet TAKE 1 TABLET BY MOUTH EVERY DAY 90 tablet 3   MAGNESIUM  PO Take 1 tablet by mouth daily.     methocarbamol  (ROBAXIN -750) 750 MG tablet Take 1 tablet (750 mg total) by mouth every 8 (eight) hours as needed for muscle spasms. MAY CAUSE DROWSINESS! 20 tablet 0   Multiple Vitamins-Minerals (MULTIVITAMIN GUMMIES ADULT PO) Take 1 tablet by mouth daily.     OVER THE COUNTER MEDICATION Swiss krisp for constipstion     spironolactone  (ALDACTONE ) 25 MG tablet TAKE 1 TABLET BY MOUTH EVERY DAY 90 tablet 3   tadalafil  (CIALIS ) 10 MG tablet Take 1 tablet (10 mg total) by mouth daily as needed for erectile  dysfunction. 10 tablet 3   tirzepatide  (ZEPBOUND ) 5 MG/0.5ML Pen Inject 5 mg into the skin once a week. Notify Dr. Duanne if you want to increase the dose. 2 mL 3   torsemide  (DEMADEX ) 20 MG tablet Take 1 tablet (20 mg total) by mouth daily. 180 tablet 2   XARELTO  20 MG TABS tablet TAKE 1 TABLET BY MOUTH DAILY WITH SUPPER 30 tablet 10   ENTRESTO  24-26 MG TAKE 1 TABLET BY MOUTH TWICE A DAY (Patient not taking: Reported on 06/16/2024) 60 tablet 11   famotidine  (PEPCID ) 20 MG tablet Take 1 tablet (20 mg total) by mouth daily as needed for heartburn or indigestion. 30 tablet 0   NON FORMULARY Pt uses a cpap nightly (Patient not taking: Reported on 06/16/2024)     ondansetron  (ZOFRAN -ODT) 4 MG disintegrating tablet Take 1 tablet (4 mg total) by mouth every 8 (eight) hours as needed for nausea or vomiting. 15 tablet 0   OVER THE COUNTER MEDICATION Take 15 mLs by mouth as needed (constipation). duralax (Patient not taking: Reported on 06/16/2024)     simethicone  (MYLICON) 80 MG chewable tablet Chew 1 tablet (80 mg total) by mouth every 6 (six) hours as needed for flatulence. 20 tablet 0   No current facility-administered medications on file prior to visit.   Allergies  Allergen Reactions   Metoprolol  Tartrate Shortness Of Breath   Oxycodone  Rash and Other (See Comments)    Can tolerate Hydrocodone    Social History   Socioeconomic History   Marital status: Married    Spouse name: Not on file   Number of children: Not on file   Years of education: Not on file   Highest education level: Not on file  Occupational History   Occupation: Public Affairs Consultant  Tobacco Use   Smoking status: Never    Passive exposure: Never   Smokeless tobacco: Never  Vaping Use   Vaping status: Never Used  Substance and Sexual Activity   Alcohol use: No   Drug use: No   Sexual activity: Yes  Other Topics Concern   Not on file  Social History Narrative   Pt lives in [place] with spouse.   Leadership training and behavioral safety training.   Social Drivers of Health   Financial Resource Strain: Medium Risk (10/29/2023)   Overall Financial Resource Strain (CARDIA)    Difficulty of Paying Living Expenses: Somewhat hard  Food Insecurity: No Food Insecurity (10/29/2023)   Hunger Vital Sign    Worried About Running Out of Food  in the Last Year: Never true    Ran Out of Food in the Last Year: Never true  Transportation Needs: No Transportation Needs (10/29/2023)   PRAPARE - Administrator, Civil Service (Medical): No    Lack of Transportation (Non-Medical): No  Physical Activity: Inactive (10/29/2023)   Exercise Vital Sign    Days of Exercise per Week: 0 days    Minutes of Exercise per Session: 0 min  Stress: Stress Concern Present (10/29/2023)   [employer] of Occupational Health - Occupational Stress Questionnaire    Feeling of Stress : To some extent  Social Connections: Unknown (10/29/2023)   Social Connection and Isolation Panel    Frequency of Communication with Friends and Family: More than three times a week    Frequency of Social Gatherings with Friends and Family: More than three times a week    Attends Religious Services: Patient unable to answer    Active Member of Clubs or Organizations: Yes    Attends Banker Meetings: More than 4 times per year    Marital Status: Married  Catering Manager Violence: Not At Risk (10/29/2023)   Humiliation, Afraid, Rape, and Kick questionnaire    Fear of Current or Ex-Partner: No    Emotionally Abused: No    Physically Abused: No    Sexually Abused: No      Review of Systems  All other systems reviewed and are negative.      Objective:   Physical Exam Vitals reviewed.  Constitutional:      General: He is not in acute distress.    Appearance: Normal appearance. He is obese. He is not ill-appearing or toxic-appearing.  Cardiovascular:     Rate and Rhythm: Normal rate and regular rhythm.      Heart sounds: Normal heart sounds.  Pulmonary:     Effort: Pulmonary effort is normal. No respiratory distress.     Breath sounds: Normal breath sounds. No stridor. No wheezing, rhonchi or rales.  Musculoskeletal:     Right lower leg: No edema.     Left lower leg: No edema.  Skin:    Findings: No erythema.  Neurological:     General: No focal deficit present.     Mental Status: He is alert and oriented to person, place, and time.     Cranial Nerves: No cranial nerve deficit.     Sensory: Sensory deficit present.     Motor: No weakness.     Coordination: Coordination normal.     Gait: Gait abnormal.           Assessment & Plan:  Idiopathic peripheral neuropathy I would be happy to arrange for the patient to meet with a neurologist.  I believe the treatment offered to him by the chiropractor seems suspicious.  I recommended gradually increasing gabapentin  up to 600 mg 3 times a day.  If he can tolerate this we could even supplement with Cymbalta 60 mg a day.  Patient will try to gradually increase the gabapentin .

## 2024-06-17 NOTE — Progress Notes (Signed)
 Remote ICD Transmission

## 2024-06-24 ENCOUNTER — Other Ambulatory Visit: Payer: Self-pay | Admitting: Family Medicine

## 2024-06-24 ENCOUNTER — Other Ambulatory Visit (HOSPITAL_COMMUNITY): Payer: Self-pay

## 2024-06-24 NOTE — Progress Notes (Signed)
° °  06/24/2024  Patient ID: [name], male   DOB: [birth year], 73 y.o.   MRN: [id number]  Pharmacy Quality Measure Review  This patient is appearing on a report for being at risk of failing the adherence measure for diabetes medications this calendar year.   Medication: farxiga  Last fill date: 11/16 for 30 day supply  Insurance report was not up to date. No action needed at this time.   Lang Sieve, PharmD, BCGP Clinical Pharmacist  [phone]

## 2024-06-28 ENCOUNTER — Other Ambulatory Visit: Payer: Self-pay | Admitting: Family Medicine

## 2024-06-28 ENCOUNTER — Other Ambulatory Visit (HOSPITAL_COMMUNITY): Payer: Self-pay

## 2024-06-28 NOTE — Telephone Encounter (Signed)
 Discontinued by provider on 02/25/24, refusing due to this.   Requested Prescriptions  Pending Prescriptions Disp Refills   tirzepatide  (ZEPBOUND ) 7.5 MG/0.5ML Pen 2 mL 0    Sig: Inject 7.5 mg into the skin once a week.     There is no refill protocol information for this order

## 2024-07-01 ENCOUNTER — Telehealth: Payer: Self-pay

## 2024-07-01 ENCOUNTER — Other Ambulatory Visit: Payer: Self-pay | Admitting: Family Medicine

## 2024-07-01 ENCOUNTER — Other Ambulatory Visit (HOSPITAL_COMMUNITY): Payer: Self-pay

## 2024-07-01 ENCOUNTER — Other Ambulatory Visit: Payer: Self-pay

## 2024-07-01 DIAGNOSIS — G4733 Obstructive sleep apnea (adult) (pediatric): Secondary | ICD-10-CM

## 2024-07-01 DIAGNOSIS — M25512 Pain in left shoulder: Secondary | ICD-10-CM | POA: Insufficient documentation

## 2024-07-01 DIAGNOSIS — M19212 Secondary osteoarthritis, left shoulder: Secondary | ICD-10-CM | POA: Diagnosis not present

## 2024-07-01 DIAGNOSIS — I5032 Chronic diastolic (congestive) heart failure: Secondary | ICD-10-CM

## 2024-07-01 DIAGNOSIS — I5022 Chronic systolic (congestive) heart failure: Secondary | ICD-10-CM

## 2024-07-01 DIAGNOSIS — M75122 Complete rotator cuff tear or rupture of left shoulder, not specified as traumatic: Secondary | ICD-10-CM | POA: Diagnosis not present

## 2024-07-01 DIAGNOSIS — E669 Obesity, unspecified: Secondary | ICD-10-CM

## 2024-07-01 MED ORDER — ZEPBOUND 7.5 MG/0.5ML ~~LOC~~ SOAJ: 7.5000 mg | SUBCUTANEOUS | 0 refills | Status: DC

## 2024-07-01 NOTE — Telephone Encounter (Unsigned)
 Copied from CRM #[id number]. Topic: Clinical - Prescription Issue >> Jul 01, 2024  4:28 PM Avram MATSU wrote: Reason for CRM: patient stated he's has been waiting for tirzepatide  (ZEPBOUND ) 5 MG/0.5ML Pen [[id number]] patient contacted pharmacy to check the status and another request was sent over today. Was informed about the turn around time.   Please advise [phone]

## 2024-07-01 NOTE — Telephone Encounter (Signed)
 Copied from CRM [id number]. Topic: Clinical - Medical Advice >> Jun 28, 2024 12:49 PM Everette C wrote: Reason for CRM: Jonel Q. with [employer] has called to request a new order for a CPAP machine for the patient   The order can be submitted to [employer] Dept via phone [phone]  Please contact [employer] further at [phone] >> Jun 28, 2024  1:40 PM CMA Sabrina J wrote: Not a patient here

## 2024-07-04 ENCOUNTER — Other Ambulatory Visit: Payer: Self-pay | Admitting: Orthopedic Surgery

## 2024-07-04 ENCOUNTER — Other Ambulatory Visit: Payer: Self-pay

## 2024-07-04 ENCOUNTER — Ambulatory Visit: Admitting: Neurology

## 2024-07-04 ENCOUNTER — Telehealth: Payer: Self-pay | Admitting: Family Medicine

## 2024-07-04 ENCOUNTER — Encounter: Payer: Self-pay | Admitting: Neurology

## 2024-07-04 VITALS — BP 120/77 | HR 64 | Ht 70.0 in | Wt 247.0 lb

## 2024-07-04 DIAGNOSIS — G629 Polyneuropathy, unspecified: Secondary | ICD-10-CM

## 2024-07-04 DIAGNOSIS — M48062 Spinal stenosis, lumbar region with neurogenic claudication: Secondary | ICD-10-CM

## 2024-07-04 DIAGNOSIS — M25432 Effusion, left wrist: Secondary | ICD-10-CM | POA: Insufficient documentation

## 2024-07-04 DIAGNOSIS — I5032 Chronic diastolic (congestive) heart failure: Secondary | ICD-10-CM

## 2024-07-04 DIAGNOSIS — G8929 Other chronic pain: Secondary | ICD-10-CM

## 2024-07-04 DIAGNOSIS — I517 Cardiomegaly: Secondary | ICD-10-CM | POA: Insufficient documentation

## 2024-07-04 DIAGNOSIS — E669 Obesity, unspecified: Secondary | ICD-10-CM

## 2024-07-04 DIAGNOSIS — G4733 Obstructive sleep apnea (adult) (pediatric): Secondary | ICD-10-CM

## 2024-07-04 NOTE — Patient Instructions (Signed)
 Nerve testing of the legs  Start physical therapy for balance training  ELECTROMYOGRAM AND NERVE CONDUCTION STUDIES (EMG/NCS) INSTRUCTIONS  How to Prepare The neurologist conducting the EMG will need to know if you have certain medical conditions. Tell the neurologist and other EMG lab personnel if you: Have a pacemaker or any other electrical medical device Take blood-thinning medications Have hemophilia, a blood-clotting disorder that causes prolonged bleeding Bathing Take a shower or bath shortly before your exam in order to remove oils from your skin. Dont apply lotions or creams before the exam.  What to Expect Youll likely be asked to change into a hospital gown for the procedure and lie down on an examination table. The following explanations can help you understand what will happen during the exam.  Electrodes. The neurologist or a technician places surface electrodes at various locations on your skin depending on where youre experiencing symptoms. Or the neurologist may insert needle electrodes at different sites depending on your symptoms.  Sensations. The electrodes will at times transmit a tiny electrical current that you may feel as a twinge or spasm. The needle electrode may cause discomfort or pain that usually ends shortly after the needle is removed. If you are concerned about discomfort or pain, you may want to talk to the neurologist about taking a short break during the exam.  Instructions. During the needle EMG, the neurologist will assess whether there is any spontaneous electrical activity when the muscle is at rest - activity that isnt present in healthy muscle tissue - and the degree of activity when you slightly contract the muscle.  He or she will give you instructions on resting and contracting a muscle at appropriate times. Depending on what muscles and nerves the neurologist is examining, he or she may ask you to change positions during the exam.  After your  EMG You may experience some temporary, minor bruising where the needle electrode was inserted into your muscle. This bruising should fade within several days. If it persists, contact your primary care doctor.

## 2024-07-04 NOTE — Progress Notes (Signed)
 [facility] Clinic Note - Initial Visit   Date: 07/04/2024   [name] MRN: [id number] DOB: 19-Feb-1951   Dear Dr. Duanne:  Thank you for your kind referral of [name] for consultation of neuropathy. Although his history is well known to you, please allow us  to reiterate it for the purpose of our medical record. The patient was accompanied to the clinic by wife who also provides collateral information.     [name] is a 73 y.o. right-handed male with OSA, atrial fibrillation, valvular heart disease s/p mitral and aortic valve repair, systolic CHF, s/p AICD, hypertension, and hyperlipidemia presenting for evaluation of neuropathy.   IMPRESSION/PLAN: Assessment & Plan Chronic sensory polyneuropathy.  Symptoms of tingling and numbness in feet for two years, worsening over time, affecting balance. No diabetes, alcohol use, or chemotherapy history. Gabapentin  900mg  TID is effective for pain. Discussed symptom management to prevent falls and balance issues.  Alternatively, he has known severe lumbar canal stenosis at L2-3 which may also contribute to his feet paresthesias.  Recommend NCS/EMG to further localize symptoms.  - NCS/EMG bilateral legs - Continue gabapentin  900 mg three times a day. - Recommend physical therapy for balance exercises. - Advise use of grab bars in the bathroom to prevent falls. - Patient educated on daily foot inspection, fall prevention, and safety precautions around the home.  Lumbar spinal stenosis at L2-3.  Severe lumbar spinal stenosis with bulging disc at L2-3 as well as impingement of the L5 and S1 nerve root bilaterally due to disc bulge, as seen on MRI lumbar spine from 2024. He was evaluated by Dr. Baird previously and is being managed conservatively.  Symptoms of back pain and leg discomfort, no weakness. Previous injection provided relief.    Return to clinic in 4  months  ------------------------------------------------------------- History of present illness: Discussed the use of AI scribe software for clinical note transcription with the patient, who gave verbal consent to proceed.  History of Present Illness [name] is a 73 year old male with neuropathy who presents for evaluation of worsening symptoms. He was referred by Dr. Owens for evaluation of neuropathy.  He has experienced tingling in the bottoms and tops of his feet for almost two years, with symptoms worsening over time. Numbness is present on the outsides of his feet, but symptoms do not extend past the ankles. The tingling is more pronounced at night, affecting his sleep.  He takes gabapentin , recently increased to three times a day, which has reduced pain and improved sleep. Occasionally, he misses the afternoon dose but compensates by taking three doses at night. He has tried various creams, including 'Momma Bear' and 'Neuro Soothe', which provide some soothing effect when applied before bed.  No numbness or tingling in the hands, but he mentions arthritis in his wrist and feet. He denies any family history of neuropathy or diabetes and has no personal history of diabetes or alcohol use. He has not undergone any chemotherapy.  He has a history of back pain and was previously diagnosed with a bulging disc. An injection provided relief. He experiences increased foot pain with prolonged walking but finds relief when using an electric bike.  He is retired and previously worked in dispensing optician. He does not smoke or drink alcohol.     Out-side paper records, electronic medical record, and images have been reviewed where available and summarized as:  MRI lumbar spine 04/15/2023: 1. At L2-L3, an  inferiorly migrated central/left subarticular zone extrusion results in severe spinal canal stenosis with impingement of the cauda equina nerve roots, left worse than  right. These findings are worsened compared to the study from 2018. 2. Disc bulge eccentric to the right at L4-L5 results in worsened right subarticular zone effacement with likely impingement of the traversing L5 nerve root. Moderate to severe right neural foraminal stenosis at this level is not significantly changed, while a left subarticular zone disc extrusion has resolved with improved left subarticular zone effacement. 3. Disc bulge eccentric to the left at L5-S1 resulting in unchanged left subarticular zone narrowing which may affect the traversing S1 nerve root.    Lab Results  Component Value Date   HGBA1C 5.6 02/04/2023   Lab Results  Component Value Date   VITAMINB12 609 05/19/2023   Lab Results  Component Value Date   TSH 2.710 08/17/2018   Lab Results  Component Value Date   ESRSEDRATE 2 04/28/2023    Past Medical History:  Diagnosis Date   AICD (automatic cardioverter/defibrillator) present    Aortic insufficiency 04/21/2018   Aortic insufficiency    Arthritis    joints (09/26/2013)   Atrial enlargement, left    CHF (congestive heart failure) (HCC)    Dysrhythmia    A. fib   Essential hypertension 11/28/2020   Hearing aid worn    both ears   Mitral regurgitation 04/21/2018   Neuromuscular disorder (HCC)    Neuropathy in both feet   Obesity    OSA on CPAP    mask adjusts to what I need (09/26/2013)   Persistent atrial fibrillation (HCC)    cardioversion 06/06/13   PONV (postoperative nausea and vomiting)    Rotator cuff tear, right 06/2011   physical therapy done, decreased strength   S/P aortic valve repair 05/18/2018   Complex valvuloplasty including plication of right coronary leaflet and 21 mm Biostable HAART annuloplasty ring   S/P Maze operation for atrial fibrillation 05/18/2018   Complete bilateral atrial lesion set using bipolar radiofrequency and cryothermy ablation with clipping of LA appendage   S/P mitral valve repair 05/18/2018    Complex valvuloplasty including artificial Gore-tex neochord placement x4 and 30 mm Sorin Memo 4D ring annuloplasty   Wears glasses     Past Surgical History:  Procedure Laterality Date   AORTIC VALVE REPAIR N/A 05/18/2018   Procedure: AORTIC VALVE REPAIR using HAART 300 Aortic Annuloplasty Device size 21mm;  Surgeon: Dusty Sudie DEL, MD;  Location: [facility] OR;  Service: Open Heart Surgery;  Laterality: N/A;   ATRIAL FIBRILLATION ABLATION N/A 04/26/2019   Procedure: ATRIAL FIBRILLATION ABLATION;  Surgeon: Kelsie Agent, MD;  Location: [facility];  Service: Cardiovascular;  Laterality: N/A;   AV NODE ABLATION N/A 12/20/2019   Procedure: AV NODE ABLATION;  Surgeon: Kelsie Agent, MD;  Location: [facility];  Service: Cardiovascular;  Laterality: N/A;   BIV ICD INSERTION CRT-D N/A 08/23/2019   Procedure: BIV ICD INSERTION CRT-D;  Surgeon: Kelsie Agent, MD;  Location: [facility];  Service: Cardiovascular;  Laterality: N/A;   CARDIOVERSION N/A 06/06/2013   Procedure: CARDIOVERSION;  Surgeon: Jerel Balding, MD;  Location: [facility]OS[facility];  Service: Cardiovascular;  Laterality: N/A;   CARDIOVERSION N/A 09/28/2013   Procedure: CARDIOVERSION BEDSIDE;  Surgeon: Peter M Jordan, MD;  Location: [facility] OR;  Service: Cardiovascular;  Laterality: N/A;   CARDIOVERSION N/A 11/30/2018   Procedure: CARDIOVERSION;  Surgeon: Maranda Leim DEL, MD;  Location: [facility] ENDOSCOPY;  Service:  Cardiovascular;  Laterality: N/A;   CARDIOVERSION N/A 03/10/2019   Procedure: CARDIOVERSION;  Surgeon: Kate Lonni CROME, MD;  Location: [facility] ENDOSCOPY;  Service: Endoscopy;  Laterality: N/A;   CARDIOVERSION N/A 12/20/2021   Procedure: CARDIOVERSION;  Surgeon: Rolan Ezra RAMAN, MD;  Location: [facility] ENDOSCOPY;  Service: Cardiovascular;  Laterality: N/A;   CARPAL TUNNEL RELEASE Right 08/08/2013   Procedure: RIGHT CARPAL TUNNEL RELEASE;  Surgeon: Arley JONELLE Curia, MD;  Location: [facility];  Service:  Orthopedics;  Laterality: Right;   CARPAL TUNNEL RELEASE Left 07/14/2006   CLIPPING OF ATRIAL APPENDAGE  05/18/2018   Procedure: CLIPPING OF ATRIAL APPENDAGE using AtriCure Clip size 45;  Surgeon: Dusty Sudie DEL, MD;  Location: [facility] OR;  Service: Open Heart Surgery;;   KNEE ARTHROSCOPY Right 03/14/1989   MAZE N/A 05/18/2018   Procedure: MAZE;  Surgeon: Dusty Sudie DEL, MD;  Location: [facility] OR;  Service: Open Heart Surgery;  Laterality: N/A;   MITRAL VALVE REPAIR N/A 05/18/2018   Procedure: MITRAL VALVE REPAIR (MVR) using 4D Memo Ring size 30;  Surgeon: Dusty Sudie DEL, MD;  Location: [facility] OR;  Service: Open Heart Surgery;  Laterality: N/A;   PACEMAKER INSERTION     RIGHT/LEFT HEART CATH AND CORONARY ANGIOGRAPHY N/A 04/21/2018   Procedure: RIGHT/LEFT HEART CATH AND CORONARY ANGIOGRAPHY;  Surgeon: Mady Lonni, MD;  Location: [facility];  Service: Cardiovascular;  Laterality: N/A;   SHOULDER OPEN ROTATOR CUFF REPAIR Left 03/14/1989   SHOULDER OPEN ROTATOR CUFF REPAIR Right 11/12/2007   TEE WITHOUT CARDIOVERSION N/A 04/21/2018   Procedure: TRANSESOPHAGEAL ECHOCARDIOGRAM (TEE);  Surgeon: Jeffrie Oneil BROCKS, MD;  Location: [facility] ENDOSCOPY;  Service: Cardiovascular;  Laterality: N/A;   TEE WITHOUT CARDIOVERSION N/A 05/18/2018   Procedure: TRANSESOPHAGEAL ECHOCARDIOGRAM (TEE);  Surgeon: Dusty Sudie DEL, MD;  Location: [facility] OR;  Service: Open Heart Surgery;  Laterality: N/A;   TONSILLECTOMY  1950's   TOTAL HIP ARTHROPLASTY Left 07/14/2008   TOTAL HIP ARTHROPLASTY Right 05/29/2020   Procedure: TOTAL HIP ARTHROPLASTY ANTERIOR APPROACH;  Surgeon: Beverley Evalene BIRCH, MD;  Location: [facility] ORS;  Service: Orthopedics;  Laterality: Right;   TOTAL HIP REVISION Left 10/08/2011   TOTAL HIP REVISION  04/09/2012   Procedure: TOTAL HIP REVISION;  Surgeon: Dempsey LULLA Moan, MD;  Location: [facility] ORS;  Service: Orthopedics;  Laterality: Left;  Left Hip Acetabular Revision vs Constrained Liner   TOTAL KNEE ARTHROPLASTY  Right 07/14/2004   TOTAL KNEE ARTHROPLASTY Left 10/13/2023   Procedure: ARTHROPLASTY, KNEE, TOTAL;  Surgeon: Beverley Evalene BIRCH, MD;  Location: [facility] ORS;  Service: Orthopedics;  Laterality: Left;   TRIGGER FINGER RELEASE Right 08/08/2013   Procedure: RELEASE STENOSING TENOSYNOVITIS RIGHT THUMB;  Surgeon: Arley JONELLE Curia, MD;  Location: [facility];  Service: Orthopedics;  Laterality: Right;     Medications:  Outpatient Encounter Medications as of 07/04/2024  Medication Sig   acetaminophen  (TYLENOL ) 500 MG tablet Take 1 tablet (500 mg total) by mouth every 6 (six) hours as needed for mild pain (pain score 1-3) or moderate pain (pain score 4-6).   carvedilol  (COREG ) 6.25 MG tablet TAKE 1 TABLET BY MOUTH 2 TIMES DAILY WITH A MEAL.   colchicine  0.6 MG tablet PLEASE SEE ATTACHED FOR DETAILED DIRECTIONS   diclofenac Sodium (VOLTAREN) 1 % GEL Apply 2 g topically daily as needed (pain).    ENTRESTO  24-26 MG TAKE 1 TABLET BY MOUTH TWICE A DAY   famotidine  (PEPCID ) 20 MG tablet Take 1 tablet (20 mg total) by mouth  daily as needed for heartburn or indigestion.   FARXIGA  10 MG TABS tablet TAKE 1 TABLET BY MOUTH DAILY BEFORE BREAKFAST.   gabapentin  (NEURONTIN ) 300 MG capsule Take 1 capsule (300 mg total) by mouth 3 (three) times daily.   HYDROcodone -acetaminophen  (NORCO/VICODIN) 5-325 MG tablet Take 1 tablet by mouth every 6 (six) hours as needed for moderate pain (pain score 4-6).   KLOR-CON  M20 20 MEQ tablet TAKE 1 TABLET BY MOUTH EVERY DAY   MAGNESIUM  PO Take 1 tablet by mouth daily.   Multiple Vitamins-Minerals (MULTIVITAMIN GUMMIES ADULT PO) Take 1 tablet by mouth daily.   NON FORMULARY Pt uses a cpap nightly   ondansetron  (ZOFRAN -ODT) 4 MG disintegrating tablet Take 1 tablet (4 mg total) by mouth every 8 (eight) hours as needed for nausea or vomiting.   OVER THE COUNTER MEDICATION Swiss krisp for constipstion (Patient taking differently: as needed. Swiss krisp for constipstion)   OVER THE  COUNTER MEDICATION Take 15 mLs by mouth as needed (constipation). duralax   spironolactone  (ALDACTONE ) 25 MG tablet TAKE 1 TABLET BY MOUTH EVERY DAY   tadalafil  (CIALIS ) 10 MG tablet Take 1 tablet (10 mg total) by mouth daily as needed for erectile dysfunction.   tirzepatide  (ZEPBOUND ) 7.5 MG/0.5ML Pen Inject 7.5 mg into the skin once a week.   torsemide  (DEMADEX ) 20 MG tablet Take 1 tablet (20 mg total) by mouth daily.   XARELTO  20 MG TABS tablet TAKE 1 TABLET BY MOUTH DAILY WITH SUPPER   methocarbamol  (ROBAXIN -750) 750 MG tablet Take 1 tablet (750 mg total) by mouth every 8 (eight) hours as needed for muscle spasms. MAY CAUSE DROWSINESS! (Patient not taking: Reported on 07/04/2024)   simethicone  (MYLICON) 80 MG chewable tablet Chew 1 tablet (80 mg total) by mouth every 6 (six) hours as needed for flatulence. (Patient not taking: Reported on 07/04/2024)   No facility-administered encounter medications on file as of 07/04/2024.    Allergies: Allergies[1]  Family History: Family History  Problem Relation Age of Onset   Heart Problems Mother    Arthritis Mother    Cancer Sister    Other Sister        bilateral hip replacements   Healthy Daughter    Healthy Son    Healthy Son    Other Other        mother had a pacemaker    Social History: Social History[2] Social History   Social History Narrative   Pt lives in [place] with spouse.  Leadership training and behavioral safety training.   Are you right handed or left handed? Right   Are you currently employed ?    What is your current occupation? retired   Do you live at home alone?   Who lives with you? wife   What type of home do you live in: 1 story or 2 story? one    Caffiene 1-2 cups a day    Vital Signs:  BP 120/77   Pulse 64   Ht 5' 10 (1.778 m)   Wt 247 lb (112 kg)   SpO2 100%   BMI 35.44 kg/m     Neurological Exam: MENTAL STATUS including orientation to time, place, person, recent and remote memory,  attention span and concentration, language, and fund of knowledge is normal.  Speech is not dysarthric.  CRANIAL NERVES: II:  No visual field defects.     III-IV-VI: Pupils equal round and reactive to light.  Normal conjugate, extra-ocular eye movements in all directions of  gaze.  No nystagmus.  No ptosis.   V:  Normal facial sensation.    VII:  Normal facial symmetry and movements.   VIII:  Normal hearing and vestibular function.   IX-X:  Normal palatal movement.   XI:  Normal shoulder shrug and head rotation.   XII:  Normal tongue strength and range of motion, no deviation or fasciculation.  MOTOR:  No atrophy, fasciculations or abnormal movements.  No pronator drift.   Upper Extremity:  Right  Left  Deltoid  5/5   5/5   Biceps  5/5   5/5   Triceps  5/5   5/5   Wrist extensors  5/5   5/5   Wrist flexors  5/5   5/5   Finger extensors  5/5   5/5   Finger flexors  5/5   5/5   Dorsal interossei  5/5   5/5   Abductor pollicis  5/5   5/5   Tone (Ashworth scale)  0  0   Lower Extremity:  Right  Left  Hip flexors  5/5   5/5   Knee flexors  5/5   5/5   Knee extensors  5/5   5/5   Dorsiflexors  5/5   5/5   Plantarflexors  5/5   5/5   Toe extensors  5/5   5/5   Toe flexors  5/5   5/5   Tone (Ashworth scale)  0  0   MSRs:                                           Right        Left brachioradialis 2+  2+  biceps 2+  2+  triceps 2+  2+  patellar 2+  2+  ankle jerk 1+  1+  Hoffman no  no  plantar response down  down   SENSORY:  Absent vibration at the great toe bilaterally.  Pin prick and temperature is intact.  Rhomberg testing is positive.  COORDINATION/GAIT: Normal finger-to- nose-finger.  Intact rapid alternating movements bilaterally. Gait is mildly wide-based, unassisted.   Unsteady with tandem gait.  Stressed gait is intact.     Thank you for allowing me to participate in patient's care.  If I can answer any additional questions, I would be pleased to do so.     Sincerely,    Emrik Erhard K. Juanita Streight, DO     [1]  Allergies Allergen Reactions   Metoprolol  Tartrate Shortness Of Breath   Oxycodone  Rash and Other (See Comments)    Can tolerate Hydrocodone   [2]  Social History Tobacco Use   Smoking status: Never    Passive exposure: Never   Smokeless tobacco: Never  Vaping Use   Vaping status: Never Used  Substance Use Topics   Alcohol use: No   Drug use: No   "

## 2024-07-04 NOTE — Telephone Encounter (Signed)
 Copied from CRM [id number]. Topic: Clinical - Medication Refill >> Jul 04, 2024  3:42 PM Joesph B wrote: Medication:  gabapentin  (NEURONTIN ) 300 MG capsule   Has the patient contacted their pharmacy? Yes (Agent: If no, request that the patient contact the pharmacy for the refill. If patient does not wish to contact the pharmacy document the reason why and proceed with request.) (Agent: If yes, when and what did the pharmacy advise?)  This is the patient's preferred pharmacy:  [id number] - [place],  - [address] [place] [address] Phone: [phone] Fax: [phone]  Is this the correct pharmacy for this prescription? Yes If no, delete pharmacy and type the correct one.   Has the prescription been filled recently? Yes  Is the patient out of the medication? No  Has the patient been seen for an appointment in the last year OR does the patient have an upcoming appointment? Yes  Can we respond through MyChart? Yes  Agent: Please be advised that Rx refills may take up to 3 business days. We ask that you follow-up with your pharmacy.

## 2024-07-05 ENCOUNTER — Telehealth: Payer: Self-pay

## 2024-07-05 ENCOUNTER — Telehealth: Payer: Self-pay | Admitting: Family Medicine

## 2024-07-05 NOTE — Telephone Encounter (Unsigned)
 Copied from CRM #[id number]. Topic: Clinical - Order For Equipment >> Jul 05, 2024 11:22 AM Berwyn MATSU wrote: Reason for CRM: Caryl from Pre authorization department called in to advised that CPT codes are not valid and needs it to be resubmitted with correct codes for CPAP machine and supplies and qty per code. Please also select a  vendor .   Reference # [id number]  Please refax to [phone]

## 2024-07-05 NOTE — Telephone Encounter (Signed)
 Copied from CRM #[id number]. Topic: Clinical - Medication Prior Auth >> Jul 05, 2024  3:16 PM Everette C wrote: Reason for CRM:  Caryl S with [employer] Prior Authorization has called to share that coverage has been denied for the code E1399 x1 A peer review has been offered with an Engineer, Agricultural which can be scheduled at [phone]  A peer-to-peer review can be requested any time prior to 1 PM 07/08/24  An expedited appeal can be submitted to [phone]  Case Ref# [id number]

## 2024-07-06 ENCOUNTER — Ambulatory Visit: Payer: Self-pay | Admitting: Student in an Organized Health Care Education/Training Program

## 2024-07-06 MED ORDER — GABAPENTIN 300 MG PO CAPS: 300.0000 mg | ORAL_CAPSULE | Freq: Three times a day (TID) | ORAL | 3 refills | Status: DC

## 2024-07-06 NOTE — Telephone Encounter (Signed)
 Requested Prescriptions  Pending Prescriptions Disp Refills   gabapentin  (NEURONTIN ) 300 MG capsule 90 capsule 3    Sig: Take 1 capsule (300 mg total) by mouth 3 (three) times daily.     Neurology: Anticonvulsants - gabapentin  Failed - 07/06/2024  1:46 PM      Failed - Cr in normal range and within 360 days    Creat  Date Value Ref Range Status  11/02/2023 1.24 0.70 - 1.28 mg/dL Final   Creatinine, Ser  Date Value Ref Range Status  02/08/2024 1.27 (H) 0.61 - 1.24 mg/dL Final         Passed - Completed PHQ-2 or PHQ-9 in the last 360 days      Passed - Valid encounter within last 12 months    Recent Outpatient Visits           2 weeks ago Idiopathic peripheral neuropathy   [place] [facility] Duanne Butler DASEN, MD   8 months ago Chronic congestive heart failure, unspecified heart failure type [facility])   [place] [facility] Duanne Butler DASEN, MD   11 months ago Persistent atrial fibrillation [facility])   [place] [facility] Duanne Butler DASEN, MD   1 year ago Acute left ankle pain   [place] [facility] Duanne Butler DASEN, MD   1 year ago Acute left ankle pain   [place] [facility] [place], Butler DASEN, MD

## 2024-07-11 ENCOUNTER — Ambulatory Visit
Admission: RE | Admit: 2024-07-11 | Discharge: 2024-07-11 | Disposition: A | Source: Ambulatory Visit | Attending: Orthopedic Surgery

## 2024-07-11 ENCOUNTER — Ambulatory Visit: Attending: Student in an Organized Health Care Education/Training Program

## 2024-07-11 DIAGNOSIS — Z95810 Presence of automatic (implantable) cardiac defibrillator: Secondary | ICD-10-CM | POA: Diagnosis not present

## 2024-07-11 DIAGNOSIS — G8929 Other chronic pain: Secondary | ICD-10-CM

## 2024-07-11 DIAGNOSIS — M12812 Other specific arthropathies, not elsewhere classified, left shoulder: Secondary | ICD-10-CM | POA: Insufficient documentation

## 2024-07-11 DIAGNOSIS — I5022 Chronic systolic (congestive) heart failure: Secondary | ICD-10-CM | POA: Diagnosis not present

## 2024-07-12 ENCOUNTER — Telehealth: Payer: Self-pay

## 2024-07-12 ENCOUNTER — Telehealth: Payer: Self-pay | Admitting: Family Medicine

## 2024-07-12 NOTE — Telephone Encounter (Signed)
 Preop clearance has been added to appt notes with upcoming visit

## 2024-07-12 NOTE — Telephone Encounter (Signed)
"  ° °  Pre-operative Risk Assessment    Patient Name: [name]  DOB: 04-24-1951 MRN: [id number]   Date of last office visit: 09/14/23 Date of next office visit: 07/25/24   Request for Surgical Clearance    Procedure:  Left reverse total shoulder arthroplasty  Date of Surgery:  Clearance TBD                                Surgeon:  Dr. Bonner Hair Surgeon's Group or Practice Name:  [facility] Phone number:  [phone] Fax number:  [phone]   Type of Clearance Requested:   - Medical    Type of Anesthesia:  General and Interscalene Block   Additional requests/questions:    Bonney Ival LOISE Gerome   07/12/2024, 9:54 AM   "

## 2024-07-12 NOTE — Telephone Encounter (Signed)
 Surgical clearance form received and will be placed in provider's green folder for completion.

## 2024-07-12 NOTE — Telephone Encounter (Signed)
 Copied from CRM #[id number]. Topic: General - Other >> Jul 12, 2024 11:17 AM Shanda MATSU wrote: Reason for CRM: Patient called in to adv that his orthopedic surgeon is going to be sending a surgical clearance to PCP so that he can have surgery on his shoulder, patient just wanted to let PCP know so that way PCP can be on the look out for surgical clearance paperwork.

## 2024-07-12 NOTE — Telephone Encounter (Signed)
" ° °  Name: BONNER LARUE  DOB: 1950-10-02  MRN: [id number]  Primary Cardiologist: Dorn Lesches, MD  Chart reviewed as part of pre-operative protocol coverage. Because of Zymarion Favorite Mcclaskey's past medical history and time since last visit, he will require a follow-up in-office visit in order to better assess preoperative cardiovascular risk.  Pre-op  covering staff: - Please schedule appointment and call patient to inform them. If patient already had an upcoming appointment within acceptable timeframe, please add pre-op  clearance to the appointment notes so provider is aware. - Please contact requesting surgeon's office via preferred method (i.e, phone, fax) to inform them of need for appointment prior to surgery.  No medications indicated as needing held.  Orren LOISE Fabry, PA-C  07/12/2024, 10:57 AM   "

## 2024-07-13 NOTE — Progress Notes (Signed)
 EPIC Encounter for ICM Monitoring  Patient Name: [name] is a 73 y.o. male Date: 07/13/2024 Primary Care Physican: Duanne Butler DASEN, MD Primary Cardiologist: Rolan Electrophysiologist:  Almetta Pore Pacing: >99%        09/12/2022 Office Weight: 285 lbs 02/04/2023 Weight: 278 lbs 05/29/2023 Weight: 269 lbs 08/20/2023 Weight: 269 lbs 11/04/2023 Weight: 247 lbs 02/23/2024 Weight: 240 lbs 05/10/2024 Weight: 237 lbs                                                          AT/AF Burden: 100% (taking Xarelto )    Spoke with patient and heart failure questions reviewed.  Transmission results reviewed.  Pt asymptomatic for fluid accumulation.  He currently has the flu and not feeling well.      Since 05/09/2024 ICM Remote Transmission: CorVue thoracic impedance suggesting possible fluid accumulation from 06/06/2024-06/13/2024 and 06/26/2024-07/05/2024.     Prescribed: Torsemide  20 mg take 1 tablet (20 mg total) daily.  He self adjusts Torsemide  if needed.  Klor Con 20 mEq take take 1 tablet daily  Spironolactone  25 mg take 1 tablet daily   Labs: 02/08/2024 Creatinine 1.27, BUN 26, Potassium 4.1, Sodium 137, GFR >60 11/02/2023 Creatinine 1.24, BUN 26, Potassium 5.2, Sodium 139 10/27/2023 Creatinine 1.44, BUN 27, Potassium 3.3, Sodium 135, GFR 52  A complete set of results can be found in Results Review.   Recommendations:  No changes and encouraged to call if experiencing any fluid symptoms.   Follow-up plan: ICM clinic phone appointment on 09/14/2024 (pt prefers every 2 months).  91 day device clinic remote transmission 09/13/2024.     EP/Cardiology Office Visits:  07/25/2024 with Dr Rolan.  Recall 09/08/2024 with Dr Cindie or EP APP.    Copy of ICM check sent to Dr. Almetta.    Remote monitoring is medically necessary for Heart Failure Management.    Daily Thoracic Impedance ICM trend: 04/12/2024 through 07/11/2024.    12-14 Month Thoracic Impedance ICM trend:     [name], RN 07/13/2024 8:11 AM

## 2024-07-18 ENCOUNTER — Ambulatory Visit: Payer: Self-pay

## 2024-07-18 ENCOUNTER — Ambulatory Visit: Admitting: Family Medicine

## 2024-07-18 ENCOUNTER — Encounter: Payer: Self-pay | Admitting: Family Medicine

## 2024-07-18 VITALS — BP 124/65 | HR 64 | Temp 97.7°F | Ht 70.0 in | Wt 244.2 lb

## 2024-07-18 DIAGNOSIS — R590 Localized enlarged lymph nodes: Secondary | ICD-10-CM | POA: Insufficient documentation

## 2024-07-18 NOTE — Telephone Encounter (Signed)
 Pt was seen in urgent care around Christmas and tested negative for COVID and strep. Pt received a steroid shot while there that did not help his symptoms. Pt's main concern is his swollen lymph nodes in his neck, states they are the size of marbles. However they are improving from yesterday when he states they were the size of a small golf ball.    FYI Only or Action Required?: FYI only for provider: appointment scheduled on 07/19/24.  Patient was last seen in primary care on 06/16/2024 by Duanne Butler DASEN, MD.  Called Nurse Triage reporting URI.  Symptoms began before Christmas.  Interventions attempted: OTC medications: NyQuil.  Symptoms are: gradually improving.  Triage Disposition: See PCP When Office is Open (Within 3 Days)  Patient/caregiver understands and will follow disposition?: Yes   Copied from CRM #[id number]. Topic: Clinical - Red Word Triage >> Jul 18, 2024  8:02 AM Tobias CROME wrote: Red Word that prompted transfer to Nurse Triage: cough, glands swollen to the point where his tongue hurts Reason for Disposition  [1] Very tender to the touch AND [2] no fever  Answer Assessment - Initial Assessment Questions 1. ONSET: When did the cough begin?      Before Christmas   2. SEVERITY: How bad is the cough today?      Severe, pt reports he is not sleeping because of it  3. SPUTUM: Describe the color of your sputum (e.g., none, dry cough; clear, white, yellow, green)     None   5. DIFFICULTY BREATHING: Are you having difficulty breathing? If Yes, ask: How bad is it? (e.g., mild, moderate, severe)      Denies  6. FEVER: Do you have a fever? If Yes, ask: What is your temperature, how was it measured, and when did it start?     Denies  7. CARDIAC HISTORY: Do you have any history of heart disease? (e.g., heart attack, congestive heart failure)      Extensive, see patient history.   8. LUNG HISTORY: Do you have any history of lung disease?  (e.g.,  pulmonary embolus, asthma, emphysema)     OSA   10. OTHER SYMPTOMS: Do you have any other symptoms? (e.g., runny nose, wheezing, chest pain)       Malaise, took Nyquil last night  12. TRAVEL: Have you traveled out of the country in the last month? (e.g., travel history, exposures)       Denies  Answer Assessment - Initial Assessment Questions 1. LOCATION: Where is the swollen node located? Is the matching node on the other side of the body also swollen?     On his neck, bilaterally. The gland is a little more swollen on right side than left, but they're both pretty big.   2. SIZE: How big is the node? (e.g., inches or centimeters; or compared to common objects such as pea, bean, marble, golf ball)      They are the size of a marble, yesterday a small golf ball  3. ONSET: When did the swelling start?      4-5 days ago  4. NECK NODES: Is there a sore throat, runny nose or other symptoms of a cold?      Cough, had a fever before and during Christmas but not since then. Pt reports initially his tongue was sore but that has since resolved.   5. GROIN OR ARMPIT NODES: Is there a sore, scratch, cut or painful red area on that arm or leg?  Denies  6. FEVER: Do you have a fever? If Yes, ask: What is it, how was it measured, and when did it start?      Denies  7. CAUSE: What do you think is causing the swollen lymph nodes?     Pt has a URI but otherwise is unsure.   8. OTHER SYMPTOMS: Do you have any other symptoms? (e.g., node is tender to touch, skin redness over node, weight changes)     Nodes are tender to touch.  Protocols used: Cough - Acute Non-Productive-A-AH, Lymph Nodes - Swollen-A-AH

## 2024-07-18 NOTE — Progress Notes (Signed)
 "  Acute Office Visit  Patient ID: [name], male    DOB: 1950-10-17, 74 y.o.   MRN: [id number]  PCP: Duanne Butler DASEN, MD  Chief Complaint  Patient presents with   Acute Visit    Post URI, Persistent cough, Swollen Glands.      Subjective:     HPI  Discussed the use of AI scribe software for clinical note transcription with the patient, who gave verbal consent to proceed.  History of Present Illness [name] is a 74 year old male who presents with persistent swollen glands and sore throat.  He initially developed a cold before Christmas, followed by a fever lasting two days. After the fever subsided, he continued to feel unwell during Christmas. Subsequently, he experienced soreness under his tongue and significant swelling of his glands, described as 'like golf balls'.  He sought care at urgent care where he was tested for COVID-19, flu, and strep throat, both of which were negative. He received a steroid shot which provided temporary relief for a day, but the pain and swelling returned. He describes difficulty talking and chewing due to the pain, and notes that the swelling has been fluctuating, with the glands being particularly large two days ago.  He currently experiences a persistent cough, especially when lying down, which he attributes to a sensation in his throat. No ear pain, ear drainage, sinus pressure, or drainage. He has not experienced similar symptoms in the past and denies any recent exposure to sick individuals. He reports feeling very tired and lacking energy, but denies current fever, chills, or body aches. He notes that the swelling has decreased since yesterday, although he still feels bumps on his neck.  He has a history of taking amoxicillin for dental procedures but is not currently on it. He also took ivermectin, suspecting COVID-19 initially, but was informed it was not COVID-19.   Review of Systems  All other systems reviewed and are  negative.   Past Medical History:  Diagnosis Date   AICD (automatic cardioverter/defibrillator) present    Aortic insufficiency 04/21/2018   Aortic insufficiency    Arthritis    joints (09/26/2013)   Atrial enlargement, left    CHF (congestive heart failure) (HCC)    Dysrhythmia    A. fib   Essential hypertension 11/28/2020   Hearing aid worn    both ears   Mitral regurgitation 04/21/2018   Neuromuscular disorder (HCC)    Neuropathy in both feet   Obesity    OSA on CPAP    mask adjusts to what I need (09/26/2013)   Persistent atrial fibrillation (HCC)    cardioversion 06/06/13   PONV (postoperative nausea and vomiting)    Rotator cuff tear, right 06/2011   physical therapy done, decreased strength   S/P aortic valve repair 05/18/2018   Complex valvuloplasty including plication of right coronary leaflet and 21 mm Biostable HAART annuloplasty ring   S/P Maze operation for atrial fibrillation 05/18/2018   Complete bilateral atrial lesion set using bipolar radiofrequency and cryothermy ablation with clipping of LA appendage   S/P mitral valve repair 05/18/2018   Complex valvuloplasty including artificial Gore-tex neochord placement x4 and 30 mm Sorin Memo 4D ring annuloplasty   Wears glasses     Past Surgical History:  Procedure Laterality Date   AORTIC VALVE REPAIR N/A 05/18/2018   Procedure: AORTIC VALVE REPAIR using HAART 300 Aortic Annuloplasty Device size 21mm;  Surgeon: Dusty Sudie DEL, MD;  Location: [facility] OR;  Service: Open Heart Surgery;  Laterality: N/A;   ATRIAL FIBRILLATION ABLATION N/A 04/26/2019   Procedure: ATRIAL FIBRILLATION ABLATION;  Surgeon: Kelsie Agent, MD;  Location: [facility] INVASIVE CV LAB;  Service: Cardiovascular;  Laterality: N/A;   AV NODE ABLATION N/A 12/20/2019   Procedure: AV NODE ABLATION;  Surgeon: Kelsie Agent, MD;  Location: [facility]VAS[facility] CV LAB;  Service: Cardiovascular;  Laterality: N/A;   BIV ICD INSERTION CRT-D N/A 08/23/2019   Procedure:  BIV ICD INSERTION CRT-D;  Surgeon: Kelsie Agent, MD;  Location: [facility]VAS[facility];  Service: Cardiovascular;  Laterality: N/A;   CARDIOVERSION N/A 06/06/2013   Procedure: CARDIOVERSION;  Surgeon: Jerel Balding, MD;  Location: [facility] ENDOSCOPY;  Service: Cardiovascular;  Laterality: N/A;   CARDIOVERSION N/A 09/28/2013   Procedure: CARDIOVERSION BEDSIDE;  Surgeon: Peter M Jordan, MD;  Location: [facility] OR;  Service: Cardiovascular;  Laterality: N/A;   CARDIOVERSION N/A 11/30/2018   Procedure: CARDIOVERSION;  Surgeon: Maranda Leim DEL, MD;  Location: [facility] ENDOSCOPY;  Service: Cardiovascular;  Laterality: N/A;   CARDIOVERSION N/A 03/10/2019   Procedure: CARDIOVERSION;  Surgeon: Kate Lonni CROME, MD;  Location: [facility] ENDOSCOPY;  Service: Endoscopy;  Laterality: N/A;   CARDIOVERSION N/A 12/20/2021   Procedure: CARDIOVERSION;  Surgeon: Rolan Ezra RAMAN, MD;  Location: [facility] ENDOSCOPY;  Service: Cardiovascular;  Laterality: N/A;   CARPAL TUNNEL RELEASE Right 08/08/2013   Procedure: RIGHT CARPAL TUNNEL RELEASE;  Surgeon: Arley JONELLE Curia, MD;  Location:  SURGERY CENTER;  Service: Orthopedics;  Laterality: Right;   CARPAL TUNNEL RELEASE Left 07/14/2006   CLIPPING OF ATRIAL APPENDAGE  05/18/2018   Procedure: CLIPPING OF ATRIAL APPENDAGE using AtriCure Clip size 45;  Surgeon: Dusty Sudie DEL, MD;  Location: [facility] OR;  Service: Open Heart Surgery;;   KNEE ARTHROSCOPY Right 03/14/1989   MAZE N/A 05/18/2018   Procedure: MAZE;  Surgeon: Dusty Sudie DEL, MD;  Location: [facility] OR;  Service: Open Heart Surgery;  Laterality: N/A;   MITRAL VALVE REPAIR N/A 05/18/2018   Procedure: MITRAL VALVE REPAIR (MVR) using 4D Memo Ring size 30;  Surgeon: Dusty Sudie DEL, MD;  Location: [facility] OR;  Service: Open Heart Surgery;  Laterality: N/A;   PACEMAKER INSERTION     RIGHT/LEFT HEART CATH AND CORONARY ANGIOGRAPHY N/A 04/21/2018   Procedure: RIGHT/LEFT HEART CATH AND CORONARY ANGIOGRAPHY;  Surgeon: Mady Lonni, MD;  Location:  [facility] INVASIVE CV LAB;  Service: Cardiovascular;  Laterality: N/A;   SHOULDER OPEN ROTATOR CUFF REPAIR Left 03/14/1989   SHOULDER OPEN ROTATOR CUFF REPAIR Right 11/12/2007   TEE WITHOUT CARDIOVERSION N/A 04/21/2018   Procedure: TRANSESOPHAGEAL ECHOCARDIOGRAM (TEE);  Surgeon: Jeffrie Oneil BROCKS, MD;  Location: [facility] ENDOSCOPY;  Service: Cardiovascular;  Laterality: N/A;   TEE WITHOUT CARDIOVERSION N/A 05/18/2018   Procedure: TRANSESOPHAGEAL ECHOCARDIOGRAM (TEE);  Surgeon: Dusty Sudie DEL, MD;  Location: [facility] OR;  Service: Open Heart Surgery;  Laterality: N/A;   TONSILLECTOMY  1950's   TOTAL HIP ARTHROPLASTY Left 07/14/2008   TOTAL HIP ARTHROPLASTY Right 05/29/2020   Procedure: TOTAL HIP ARTHROPLASTY ANTERIOR APPROACH;  Surgeon: Beverley Evalene BIRCH, MD;  Location: [facility] ORS;  Service: Orthopedics;  Laterality: Right;   TOTAL HIP REVISION Left 10/08/2011   TOTAL HIP REVISION  04/09/2012   Procedure: TOTAL HIP REVISION;  Surgeon: Dempsey LULLA Moan, MD;  Location: [facility] ORS;  Service: Orthopedics;  Laterality: Left;  Left Hip Acetabular Revision vs Constrained Liner   TOTAL KNEE ARTHROPLASTY Right 07/14/2004   TOTAL KNEE ARTHROPLASTY Left 10/13/2023   Procedure: ARTHROPLASTY, KNEE, TOTAL;  Surgeon:  Beverley Evalene BIRCH, MD;  Location: [facility] ORS;  Service: Orthopedics;  Laterality: Left;   TRIGGER FINGER RELEASE Right 08/08/2013   Procedure: RELEASE STENOSING TENOSYNOVITIS RIGHT THUMB;  Surgeon: Arley JONELLE Curia, MD;  Location:  SURGERY CENTER;  Service: Orthopedics;  Laterality: Right;    Outpatient Medications Prior to Visit  Medication Sig Dispense Refill   acetaminophen  (TYLENOL ) 500 MG tablet Take 1 tablet (500 mg total) by mouth every 6 (six) hours as needed for mild pain (pain score 1-3) or moderate pain (pain score 4-6). 30 tablet 0   amoxicillin (AMOXIL) 500 MG capsule Take 500 mg by mouth 2 (two) times daily.     carvedilol  (COREG ) 6.25 MG tablet TAKE 1 TABLET BY MOUTH 2 TIMES DAILY WITH A MEAL. 180 tablet 3    colchicine  0.6 MG tablet PLEASE SEE ATTACHED FOR DETAILED DIRECTIONS 90 tablet 1   diclofenac Sodium (VOLTAREN) 1 % GEL Apply 2 g topically daily as needed (pain).      ENTRESTO  24-26 MG TAKE 1 TABLET BY MOUTH TWICE A DAY 60 tablet 11   famotidine  (PEPCID ) 20 MG tablet Take 1 tablet (20 mg total) by mouth daily as needed for heartburn or indigestion. 30 tablet 0   FARXIGA  10 MG TABS tablet TAKE 1 TABLET BY MOUTH DAILY BEFORE BREAKFAST. 30 tablet 11   gabapentin  (NEURONTIN ) 300 MG capsule Take 1 capsule (300 mg total) by mouth 3 (three) times daily. 90 capsule 3   HYDROcodone -acetaminophen  (NORCO/VICODIN) 5-325 MG tablet Take 1 tablet by mouth every 6 (six) hours as needed for moderate pain (pain score 4-6). 30 tablet 0   KLOR-CON  M20 20 MEQ tablet TAKE 1 TABLET BY MOUTH EVERY DAY 90 tablet 3   MAGNESIUM  PO Take 1 tablet by mouth daily.     Multiple Vitamins-Minerals (MULTIVITAMIN GUMMIES ADULT PO) Take 1 tablet by mouth daily.     NON FORMULARY Pt uses a cpap nightly     ondansetron  (ZOFRAN -ODT) 4 MG disintegrating tablet Take 1 tablet (4 mg total) by mouth every 8 (eight) hours as needed for nausea or vomiting. 15 tablet 0   OVER THE COUNTER MEDICATION Swiss krisp for constipstion (Patient taking differently: as needed. Swiss krisp for constipstion)     OVER THE COUNTER MEDICATION Take 15 mLs by mouth as needed (constipation). duralax     spironolactone  (ALDACTONE ) 25 MG tablet TAKE 1 TABLET BY MOUTH EVERY DAY 90 tablet 3   tadalafil  (CIALIS ) 10 MG tablet Take 1 tablet (10 mg total) by mouth daily as needed for erectile dysfunction. 10 tablet 3   tirzepatide  (ZEPBOUND ) 7.5 MG/0.5ML Pen Inject 7.5 mg into the skin once a week. 2 mL 0   torsemide  (DEMADEX ) 20 MG tablet Take 1 tablet (20 mg total) by mouth daily. 180 tablet 2   XARELTO  20 MG TABS tablet TAKE 1 TABLET BY MOUTH DAILY WITH SUPPER 30 tablet 10   methocarbamol  (ROBAXIN -750) 750 MG tablet Take 1 tablet (750 mg total) by mouth every 8  (eight) hours as needed for muscle spasms. MAY CAUSE DROWSINESS! (Patient not taking: Reported on 07/18/2024) 20 tablet 0   simethicone  (MYLICON) 80 MG chewable tablet Chew 1 tablet (80 mg total) by mouth every 6 (six) hours as needed for flatulence. (Patient not taking: Reported on 07/18/2024) 20 tablet 0   No facility-administered medications prior to visit.    Allergies[1]     Objective:    BP 124/65   Pulse 64   Temp 97.7 F (36.5  C)   Ht 5' 10 (1.778 m)   Wt 244 lb 3.2 oz (110.8 kg)   SpO2 97%   BMI 35.04 kg/m  BP Readings from Last 3 Encounters:  07/18/24 124/65  07/04/24 120/77  06/16/24 120/70   Wt Readings from Last 3 Encounters:  07/18/24 244 lb 3.2 oz (110.8 kg)  07/04/24 247 lb (112 kg)  06/16/24 238 lb 3.2 oz (108 kg)      Physical Exam Vitals and nursing note reviewed.  Constitutional:      Appearance: Normal appearance. He is normal weight.  HENT:     Head: Normocephalic and atraumatic.     Right Ear: Tympanic membrane, ear canal and external ear normal.     Left Ear: Tympanic membrane, ear canal and external ear normal.     Nose: Nose normal.     Right Sinus: No maxillary sinus tenderness or frontal sinus tenderness.     Left Sinus: No maxillary sinus tenderness or frontal sinus tenderness.     Mouth/Throat:     Mouth: Mucous membranes are moist.     Pharynx: Oropharynx is clear.     Comments: Enlarged sublingual glands Cardiovascular:     Rate and Rhythm: Normal rate and regular rhythm.     Pulses: Normal pulses.     Heart sounds: Normal heart sounds.  Pulmonary:     Effort: Pulmonary effort is normal.     Breath sounds: Normal breath sounds.  Lymphadenopathy:     Head:     Right side of head: No submental, submandibular, tonsillar, preauricular, posterior auricular or occipital adenopathy.     Left side of head: No submental, submandibular, tonsillar, preauricular, posterior auricular or occipital adenopathy.     Cervical: Cervical adenopathy  present.     Upper Body:     Right upper body: No supraclavicular adenopathy.     Left upper body: No supraclavicular adenopathy.  Skin:    General: Skin is warm and dry.     Capillary Refill: Capillary refill takes less than 2 seconds.  Neurological:     General: No focal deficit present.     Mental Status: He is alert and oriented to person, place, and time. Mental status is at baseline.  Psychiatric:        Mood and Affect: Mood normal.        Behavior: Behavior normal.        Thought Content: Thought content normal.        Judgment: Judgment normal.       No results found for any visits on 07/18/24.     Assessment & Plan:   Problem List Items Addressed This Visit       Immune and Lymphatic   Anterior cervical lymphadenopathy - Primary    Assessment and Plan Assessment & Plan Viral illness with reactive lymphadenopathy and salivary gland swelling Symptoms consistent with viral illness. Lymphadenopathy and salivary gland swelling likely reactive. No bacterial infection or dental issues. Steroid shot provided temporary relief, indicating inflammation. - Continue watchful waiting as symptoms improve. - Advised to return if symptoms worsen or new symptoms develop.    No orders of the defined types were placed in this encounter.   Return if symptoms worsen or fail to improve.  Jeoffrey GORMAN Barrio, FNP [facility]      [1]  Allergies Allergen Reactions   Metoprolol  Tartrate Shortness Of Breath   Oxycodone  Rash and Other (See Comments)    Can  tolerate Hydrocodone    "

## 2024-07-19 ENCOUNTER — Ambulatory Visit: Admitting: Family Medicine

## 2024-07-20 ENCOUNTER — Telehealth: Payer: Self-pay

## 2024-07-20 NOTE — Telephone Encounter (Signed)
 Copied from CRM #[id number]. Topic: Clinical - Medication Prior Auth >> Jul 19, 2024  2:26 PM Willma R wrote: Reason for CRM: Gleen from [employer] is calling stating the patient needs prior authorization for his c-pap machine.  [employer] Customer Service can be reached at [phone] Patient can be reached at [phone]

## 2024-07-22 ENCOUNTER — Telehealth (HOSPITAL_COMMUNITY): Payer: Self-pay | Admitting: Cardiology

## 2024-07-22 NOTE — Telephone Encounter (Signed)
 Called to confirm/remind patient of their appointment at [facility] on 07/22/24.   Appointment:   [x] Confirmed  [] Left mess   [] No answer/No voice mail  [] VM Full/unable to leave message  [] Phone not in service  Patient reminded to bring all medications and/or complete list.  Confirmed patient has transportation. Gave directions, instructed to utilize valet parking.

## 2024-07-25 ENCOUNTER — Encounter (HOSPITAL_COMMUNITY): Payer: Self-pay | Admitting: Cardiology

## 2024-07-25 ENCOUNTER — Ambulatory Visit (HOSPITAL_COMMUNITY)
Admission: RE | Admit: 2024-07-25 | Discharge: 2024-07-25 | Disposition: A | Discharge status: Home / Self Care | Source: Ambulatory Visit | Attending: Cardiology | Admitting: Cardiology

## 2024-07-25 VITALS — BP 118/68 | HR 63 | Ht 70.0 in | Wt 244.8 lb

## 2024-07-25 DIAGNOSIS — Z952 Presence of prosthetic heart valve: Secondary | ICD-10-CM | POA: Diagnosis not present

## 2024-07-25 DIAGNOSIS — M25512 Pain in left shoulder: Secondary | ICD-10-CM | POA: Insufficient documentation

## 2024-07-25 DIAGNOSIS — Z6835 Body mass index (BMI) 35.0-35.9, adult: Secondary | ICD-10-CM | POA: Insufficient documentation

## 2024-07-25 DIAGNOSIS — I484 Atypical atrial flutter: Secondary | ICD-10-CM | POA: Diagnosis not present

## 2024-07-25 DIAGNOSIS — I5022 Chronic systolic (congestive) heart failure: Secondary | ICD-10-CM | POA: Insufficient documentation

## 2024-07-25 DIAGNOSIS — E669 Obesity, unspecified: Secondary | ICD-10-CM | POA: Diagnosis not present

## 2024-07-25 DIAGNOSIS — Z7985 Long-term (current) use of injectable non-insulin antidiabetic drugs: Secondary | ICD-10-CM | POA: Diagnosis not present

## 2024-07-25 DIAGNOSIS — I428 Other cardiomyopathies: Secondary | ICD-10-CM | POA: Insufficient documentation

## 2024-07-25 DIAGNOSIS — Z95810 Presence of automatic (implantable) cardiac defibrillator: Secondary | ICD-10-CM | POA: Insufficient documentation

## 2024-07-25 DIAGNOSIS — G4733 Obstructive sleep apnea (adult) (pediatric): Secondary | ICD-10-CM | POA: Diagnosis not present

## 2024-07-25 DIAGNOSIS — Z7901 Long term (current) use of anticoagulants: Secondary | ICD-10-CM | POA: Diagnosis not present

## 2024-07-25 DIAGNOSIS — M25511 Pain in right shoulder: Secondary | ICD-10-CM | POA: Diagnosis not present

## 2024-07-25 DIAGNOSIS — I482 Chronic atrial fibrillation, unspecified: Secondary | ICD-10-CM | POA: Insufficient documentation

## 2024-07-25 DIAGNOSIS — I5032 Chronic diastolic (congestive) heart failure: Secondary | ICD-10-CM

## 2024-07-25 DIAGNOSIS — I7121 Aneurysm of the ascending aorta, without rupture: Secondary | ICD-10-CM | POA: Diagnosis not present

## 2024-07-25 DIAGNOSIS — I4892 Unspecified atrial flutter: Secondary | ICD-10-CM | POA: Diagnosis present

## 2024-07-25 DIAGNOSIS — Z7984 Long term (current) use of oral hypoglycemic drugs: Secondary | ICD-10-CM | POA: Insufficient documentation

## 2024-07-25 DIAGNOSIS — Z79899 Other long term (current) drug therapy: Secondary | ICD-10-CM | POA: Diagnosis not present

## 2024-07-25 DIAGNOSIS — M19012 Primary osteoarthritis, left shoulder: Secondary | ICD-10-CM | POA: Diagnosis not present

## 2024-07-25 LAB — CBC
HCT: 44.3 % (ref 39.0–52.0)
Hemoglobin: 14.6 g/dL (ref 13.0–17.0)
MCH: 29.3 pg (ref 26.0–34.0)
MCHC: 33 g/dL (ref 30.0–36.0)
MCV: 89 fL (ref 80.0–100.0)
Platelets: 167 K/uL (ref 150–400)
RBC: 4.98 MIL/uL (ref 4.22–5.81)
RDW: 14 % (ref 11.5–15.5)
WBC: 9.8 K/uL (ref 4.0–10.5)
nRBC: 0 % (ref 0.0–0.2)

## 2024-07-25 LAB — LIPID PANEL
Cholesterol: 173 mg/dL (ref 0–200)
HDL: 49 mg/dL
LDL Cholesterol: 98 mg/dL (ref 0–99)
Total CHOL/HDL Ratio: 3.5 ratio
Triglycerides: 126 mg/dL
VLDL: 25 mg/dL (ref 0–40)

## 2024-07-25 LAB — BASIC METABOLIC PANEL WITH GFR
Anion gap: 8 (ref 5–15)
BUN: 33 mg/dL — ABNORMAL HIGH (ref 8–23)
CO2: 29 mmol/L (ref 22–32)
Calcium: 9.6 mg/dL (ref 8.9–10.3)
Chloride: 103 mmol/L (ref 98–111)
Creatinine, Ser: 1.58 mg/dL — ABNORMAL HIGH (ref 0.61–1.24)
GFR, Estimated: 46 mL/min — ABNORMAL LOW
Glucose, Bld: 83 mg/dL (ref 70–99)
Potassium: 4.7 mmol/L (ref 3.5–5.1)
Sodium: 140 mmol/L (ref 135–145)

## 2024-07-25 NOTE — Progress Notes (Signed)
 PCP: Duanne Butler DASEN, MD HF Cardiology: Dr. Rolan  Chief complaint: CHF  [name], valvular heart disease s/p mitral and aortic valve repairs, [name]  Patient has a long history of atrial fibrillation.  He was initially controlled by Tikosyn  but it eventually became chronic.  He had significant mitral and aortic regurgitation, and in 11/19, he had mitral and aortic valve repairs with Maze and LA appendage ligation. Cath prior to valve surgery in 10/19 showed no significant coronary disease.  It appears from ECGs that he never went back into NSR.  He has had slow atrial fibrillation with rate in upper 30s-40s at times, so was never started on amiodarone .  Last echo in 4/20 showed EF 40-45% with stable aortic and mitral valve repairs.  He has developed worsening volume overload, and Lasix  has been titrated up to 80 mg bid.  Most recently, he went into atypical atrial flutter with HR consistently in the 110s range.  He therefore was taken for DCCV 03/10/19.  Initially rhythm looked junctional in the 40s-50s, but he subsequently went back into rapid atypical atrial flutter with elevated rate (>100 bpm).   Echo was done in 9/20, showing EF worse at 20-25%.     He saw Dr. Kelsie and had atrial flutter + atrial fibrillation ablation in 10/20.  Post-procedure, he was noted to be in slow atrial fibrillation (rate 40s) at time of his atrial fibrillation clinic followup.   Patient had St Jude CRT-D implantation in 2/21.  In 6/21, he had AV nodal ablation to allow CRT given recurrent atrial flutter/fibrillation. Echo in 10/21 showed EF up to 60-65%.    He was seen in atrial fibrillation clinic in 5/23 and was noted to be in atrial fibrillation since 4/23.  Thoracic impedance suggested fluid retention. He was in atrial fibrillation when I saw him in 5/23 and was set up  for DCCV in 6/23. He went back into NSR at the time but is in atrial fibrillation again today.   Echo in 7/23 showed EF 50-55%, global hypokinesis, mildly dilated and mildly dysfunctional RV, s/p MV repair with no MR and mean gradient 4, s/p aortic valve repair with mild AS mean gradient 11 and mild AI, normal IVC.   Echo in 7/24 showed EF 55%, mild LVH, mild RV enlargement with mildly decreased RV systolic function, s/p aortic valve repair with mean gradient 12, s/p mitral valve repair with mean gradient 4 mmHg, IVC normal, PASP 27 mmHg.   Echo 3/25 showed EF 55-60%, stable valves. S/p left knee arthroplasty 4/25  Admitted 4/25 with CP in setting of 2 days of N/V and abdominal pain. HsTroponin mildly elevated, cardiology consulted, felt 2/2 to demand ischemia from hypertensive urgency. V/Q scan negative. GI symptoms felt to be related to Zepbound  and Rx was discontinued.   PYP scan (8/25) was not suggestive of TTR cardiac amyloidosis.   Today he returns for HF followup with his wife. Doing well overall, weight down another 3 lbs.  Using gabapentin  for neuropathy.  No exertional dyspnea or chest pain.  No orthopnea/PND.  No lightheadedness.  He rides his e-bike for exercise. He has bilateral shoulder pain and is planning a left shoulder replacement in the near future.   ECG (personally reviewed): AF with BiV pacing.   St Jude device interrogation: >99% BiV pacing, stable thoracic impedance, no VT.  Labs (3/24): K 3.8, creatinine 1.2, LDL 105, hgb 14.6 Labs (1/25): K 4.8, creatinine 1.35 Labs (4/25): K 5.2, creatinine 1.24 Labs (7/25): K 4.1, creatinine 1.27 Labs (8/25): Urine and serum immunofixations negative.   PMH: 1. HTN 2. Paroxysmal atrial fibrillation/flutter: Failed Tikosyn .  Had Maze procedure 11/19 but does not appear to have ever gone back into NSR.  Had been in atrial fibrillation with bradycardia with rate in 30s-40s at times, now in atypical atrial flutter primarily with rate  100s-110s.  He failed DCCV 8/20.  No amiodarone  due to h/o bradycardia.  - Atrial fibrillation + atrial flutter ablation in 10/20.  - DCCV to NSR in 6/23 but quickly back into atrial fibrillation.  3. OSA: Uses CPAP.  4. Chronic systolic CHF: Nonischemic cardiomyopathy.  - 10/19 LHC: No coronary disease.  - Echo (4/20): EF 40-45%, mild LVH, moderate dilated RV with mildly decreased systolic function, s/p MV repair with no significant stenosis or regurgitation, s/p aortic valve repair with mild AI, mild aortic stenosis.  Dilated IVC.  - Echo (9/20): EF 20-25%, mild LVH with moderate LV dilation, s/p MV repair with mean gradient 6, s/p AoV repair with mean gradient 8. No significant MR or AI.  - St Jude CRT-D implantation in 2/21.  - AV nodal ablation 6/21.  - Echo (10/21): EF 60-65%, mild LV enlargement and LVH, moderate RV enlargement with normal function, severe LVE, s/p MV repair with no MR and mean gradient 3, s/p aortic valve repair with mean gradient 9 and no AI, ascending aorta 4.6 cm.  - Echo (7/23): EF 50-55%, global hypokinesis, mildly dilated and mildly dysfunctional RV, s/p MV repair with no MR and mean gradient 4, s/p aortic valve repair with mild AS mean gradient 11 and mild AI, normal IVC.  - Echo (7/24): EF 55%, mild LVH, mild RV enlargement with mildly decreased RV systolic function, s/p aortic valve repair with mean gradient 12, s/p mitral valve repair with mean gradient 4 mmHg, IVC normal, PASP 27 mmHg.  - Echo (3/25): EF 55-60%, normal RV, s/p aortic valve repair with mean gradient 10 mmHg, s/p mitral vale repair with mean gradient 3.0 mmHg - PYP scan (8/25): No evidence for TTR cardiac amyloidosis.  5. Valvular heart disease: Patient has history of MR and AI.  In 11/19, he had mitral valve repair, aortic valve repair, surgical Maze, and LA appendage excision.  6. LBBB 7. Ascending aorta aneurysm: 4.6 cm on 10/21 echo.  - CTA chest (7/22): 4.1 cm ascending aorta 8. Bilateral  THRs  Social History   Socioeconomic History   Marital status: Married    Spouse name: Not on file   Number of children: Not on file   Years of education: Not on file   Highest education level: Not on file  Occupational History   Occupation: Public Affairs Consultant  Tobacco Use   Smoking status: Never    Passive exposure: Never   Smokeless tobacco: Never  Vaping Use   Vaping status: Never Used  Substance and Sexual Activity   Alcohol use: No   Drug use: No   Sexual activity: Yes  Other Topics Concern   Not on file  Social History Narrative   Pt lives in [place] with spouse.  Leadership training and behavioral safety training.   Are you right handed or left handed? Right   Are you currently employed ?    What is your current occupation? retired   Do you live at home alone?  Who lives with you? wife   What type of home do you live in: 1 story or 2 story? one    Caffiene 1-2 cups a day   Social Drivers of Health   Tobacco Use: Low Risk (07/25/2024)   Patient History    Smoking Tobacco Use: Never    Smokeless Tobacco Use: Never    Passive Exposure: Never  Financial Resource Strain: Medium Risk (10/29/2023)   Overall Financial Resource Strain (CARDIA)    Difficulty of Paying Living Expenses: Somewhat hard  Food Insecurity: No Food Insecurity (10/29/2023)   Hunger Vital Sign    Worried About Running Out of Food in the Last Year: Never true    Ran Out of Food in the Last Year: Never true  Transportation Needs: No Transportation Needs (10/29/2023)   PRAPARE - Administrator, Civil Service (Medical): No    Lack of Transportation (Non-Medical): No  Physical Activity: Inactive (10/29/2023)   Exercise Vital Sign    Days of Exercise per Week: 0 days    Minutes of Exercise per Session: 0 min  Stress: Stress Concern Present (10/29/2023)   [employer] of Occupational Health - Occupational Stress Questionnaire    Feeling of Stress : To some  extent  Social Connections: Unknown (10/29/2023)   Social Connection and Isolation Panel    Frequency of Communication with Friends and Family: More than three times a week    Frequency of Social Gatherings with Friends and Family: More than three times a week    Attends Religious Services: Patient unable to answer    Active Member of Clubs or Organizations: Yes    Attends Banker Meetings: More than 4 times per year    Marital Status: Married  Catering Manager Violence: Not At Risk (10/29/2023)   Humiliation, Afraid, Rape, and Kick questionnaire    Fear of Current or Ex-Partner: No    Emotionally Abused: No    Physically Abused: No    Sexually Abused: No  Depression (PHQ2-9): Low Risk (10/29/2023)   Depression (PHQ2-9)    PHQ-2 Score: 0  Alcohol Screen: Low Risk (10/29/2023)   Alcohol Screen    Last Alcohol Screening Score (AUDIT): 0  Housing: Low Risk (10/29/2023)   Housing Stability Vital Sign    Unable to Pay for Housing in the Last Year: No    Number of Times Moved in the Last Year: 0    Homeless in the Last Year: No  Utilities: Not At Risk (10/29/2023)   AHC Utilities    Threatened with loss of utilities: No  Health Literacy: Adequate Health Literacy (10/29/2023)   B1300 Health Literacy    Frequency of need for help with medical instructions: Never   Family History  Problem Relation Age of Onset   Heart Problems Mother    Arthritis Mother    Cancer Sister    Other Sister        bilateral hip replacements   Healthy Daughter    Healthy Son    Healthy Son    Other Other        mother had a pacemaker   ROS: All systems reviewed and negative except as per HPI.   Current Outpatient Medications  Medication Sig Dispense Refill   acetaminophen  (TYLENOL ) 500 MG tablet Take 1 tablet (500 mg total) by mouth every 6 (six) hours as needed for mild pain (pain score 1-3) or moderate pain (pain score 4-6). 30 tablet 0   carvedilol  (COREG ) 6.25  MG tablet TAKE 1 TABLET BY  MOUTH 2 TIMES DAILY WITH A MEAL. 180 tablet 3   colchicine  0.6 MG tablet PLEASE SEE ATTACHED FOR DETAILED DIRECTIONS 90 tablet 1   diclofenac Sodium (VOLTAREN) 1 % GEL Apply 2 g topically daily as needed (pain).      ENTRESTO  24-26 MG TAKE 1 TABLET BY MOUTH TWICE A DAY 60 tablet 11   famotidine  (PEPCID ) 20 MG tablet Take 1 tablet (20 mg total) by mouth daily as needed for heartburn or indigestion. 30 tablet 0   FARXIGA  10 MG TABS tablet TAKE 1 TABLET BY MOUTH DAILY BEFORE BREAKFAST. 30 tablet 11   gabapentin  (NEURONTIN ) 300 MG capsule Take 1 capsule (300 mg total) by mouth 3 (three) times daily. 90 capsule 3   HYDROcodone -acetaminophen  (NORCO/VICODIN) 5-325 MG tablet Take 1 tablet by mouth every 6 (six) hours as needed for moderate pain (pain score 4-6). 30 tablet 0   KLOR-CON  M20 20 MEQ tablet TAKE 1 TABLET BY MOUTH EVERY DAY 90 tablet 3   MAGNESIUM  PO Take 1 tablet by mouth daily.     Multiple Vitamins-Minerals (MULTIVITAMIN GUMMIES ADULT PO) Take 1 tablet by mouth daily.     NON FORMULARY Pt uses a cpap nightly     ondansetron  (ZOFRAN -ODT) 4 MG disintegrating tablet Take 1 tablet (4 mg total) by mouth every 8 (eight) hours as needed for nausea or vomiting. 15 tablet 0   OVER THE COUNTER MEDICATION Swiss krisp for constipstion (Patient taking differently: as needed. Swiss krisp for constipstion)     OVER THE COUNTER MEDICATION Take 15 mLs by mouth as needed (constipation). duralax     spironolactone  (ALDACTONE ) 25 MG tablet TAKE 1 TABLET BY MOUTH EVERY DAY 90 tablet 3   tadalafil  (CIALIS ) 10 MG tablet Take 1 tablet (10 mg total) by mouth daily as needed for erectile dysfunction. 10 tablet 3   tirzepatide  (ZEPBOUND ) 7.5 MG/0.5ML Pen Inject 7.5 mg into the skin once a week. 2 mL 0   torsemide  (DEMADEX ) 20 MG tablet Take 1 tablet (20 mg total) by mouth daily. 180 tablet 2   XARELTO  20 MG TABS tablet TAKE 1 TABLET BY MOUTH DAILY WITH SUPPER 30 tablet 10   methocarbamol  (ROBAXIN -750) 750 MG tablet  Take 1 tablet (750 mg total) by mouth every 8 (eight) hours as needed for muscle spasms. MAY CAUSE DROWSINESS! (Patient not taking: Reported on 07/18/2024) 20 tablet 0   No current facility-administered medications for this encounter.   Wt Readings from Last 3 Encounters:  07/25/24 111 kg (244 lb 12.8 oz)  07/18/24 110.8 kg (244 lb 3.2 oz)  07/04/24 112 kg (247 lb)   BP 118/68   Pulse 63   Ht 5' 10 (1.778 m)   Wt 111 kg (244 lb 12.8 oz)   SpO2 95%   BMI 35.13 kg/m  General: NAD Neck: No JVD, no thyromegaly or thyroid  nodule.  Lungs: Clear to auscultation bilaterally with normal respiratory effort. CV: Nondisplaced PMI.  Heart regular S1/S2, no S3/S4, no murmur.  No peripheral edema.  No carotid bruit.  Normal pedal pulses.  Abdomen: Soft, nontender, no hepatosplenomegaly, no distention.  Skin: Intact without lesions or rashes.  Neurologic: Alert and oriented x 3.  Psych: Normal affect. Extremities: No clubbing or cyanosis.  HEENT: Normal.   Assessment/Plan: 1. Chronic systolic CHF: Echo in 4/20 with EF 40-45%, moderate RV dilation/mildly decreased RV function.  Nonischemic cardiomyopathy, cath in 10/19 without significant coronary disease.  Repeat echo in 9/20  showed fall in EF to 20-25% in the setting of atrial fibrillation and atrial flutter with persistent tachycardia.  He is now s/p St Jude CRT-D and AV nodal ablation.  Echo in 10/21 with EF up to 60-65%. Echo in 7/23 showed EF 50-55%, global hypokinesis, mildly dilated and mildly dysfunctional RV, s/p MV repair with no MR and mean gradient 4, s/p aortic valve repair with mild AS mean gradient 11 and mild AI, normal IVC.  Echo in 7/24 showed EF 55%, mild LVH, mild RV enlargement with mildly decreased RV systolic function, s/p aortic valve repair with mean gradient 12, s/p mitral valve repair with mean gradient 4 mmHg, IVC normal, PASP 27 mmHg. Echo 3/25 showed EF 55-60%, stable valves.  PYP scan in 8/25 showed no evidence for  transthyretin cardiac amyloidosis.  NYHA class I-II.  He is not volume overloaded on exam or by Corvue.   - Continue torsemide  20 mg daily. BMET today.  - Continue spironolactone  25 mg daily.   - Continue Entresto  24/26 bid.    - Continue Coreg  6.25 mg bid.  - Continue Farxiga  10 mg daily.    2. Atrial arrhythmias: Long history of chronic atrial fibrillation, has had slow ventricular response in the past (HR 40s at times). He failed Tikosyn  and have avoided amiodarone  with bradycardia.  He had Maze procedure in 11/19 but it was not successful.  At initial appointment with me, he was in atypical flutter with HR 110s.  He failed DCCV in 8/20.  He had flutter/fibrillation ablation in 10/20.  Initially after ablation, he was in slow atrial fibrillation (rate upper 30s-50s generally).  He is now s/p AV nodal ablation with CRT-D. Atrial fibrillation is now permanent.  Cardioversion was attempted in 6/23 but did not hold.  As he is BiV pacing > 99% of the time in atrial fibrillation s/p AV nodal ablation, I think we can hold off on trying cardioversion again.  - He will continue Xarelto . CBC today.   3. OSA: Continue CPAP.  4. Valvular heart disease: S/p MV and AoV repairs in 11/19.  Valves looked stable on 3/25 echo.  - He will need antibiotic prophylaxis with dental work.  5. Ascending aortic aneurysm: 4.6 cm on 10/21 echo. CTA chest in 7/22 showed 4.1 cm ascending aorta.  6. Obesity: Body mass index is 35.13 kg/m. - He is now on Zepbound  and continues to lose weight.  7. Shoulder osteoarthritis: He is planning a left shoulder replacement with Dr. Cristy.  He is active and minimally symptomatic.  EF was normal in echo within the last year.  I think he is stable to undergo surgery with no further testing.  He will need to hold Xarelto  and tirzepatide  prior to procedure.   Followup in 6 months with APP  I spent 31 minutes reviewing data, interviewing patient, and organizing the orders/followup.    [name]  07/25/2024

## 2024-07-25 NOTE — Patient Instructions (Signed)
 Medication Changes:  None, continue current medications  Lab Work:  Labs done today, your results will be available in MyChart, we will contact you for abnormal readings.  Referrals:  You have been referred to follow-up with EP for your device check, they will call you to schedule  Special Instructions // Education:  Do the following things EVERYDAY: Weigh yourself in the morning before breakfast. Write it down and keep it in a log. Take your medicines as prescribed Eat low salt foods--Limit salt (sodium) to 2000 mg per day.  Stay as active as you can everyday Limit all fluids for the day to less than 2 liters   Follow-Up in: 6 months   At the [facility], you and your health needs are our priority. We have a designated team specialized in the treatment of Heart Failure. This Care Team includes your primary Heart Failure Specialized Cardiologist (physician), Advanced Practice Providers (APPs- Physician Assistants and Nurse Practitioners), and Pharmacist who all work together to provide you with the care you need, when you need it.   You may see any of the following providers on your designated Care Team at your next follow up:  Dr. Toribio Fuel Dr. Ezra Shuck Dr. Odis Brownie Greig Mosses, NP Caffie Shed, [place] [facility] [place], [place] Beckey Coe, NP Jordan Lee, NP Tinnie Redman, PharmD   Please be sure to bring in all your medications bottles to every appointment.   Need to Contact Us :  If you have any questions or concerns before your next appointment please send us  a message through [place] or call our office at [phone].    TO LEAVE A MESSAGE FOR THE NURSE SELECT OPTION 2, PLEASE LEAVE A MESSAGE INCLUDING: YOUR NAME DATE OF BIRTH CALL BACK NUMBER REASON FOR CALL**this is important as we prioritize the call backs  YOU WILL RECEIVE A CALL BACK THE SAME DAY AS LONG AS YOU CALL BEFORE 4:00 PM

## 2024-07-26 ENCOUNTER — Ambulatory Visit (HOSPITAL_COMMUNITY): Payer: Self-pay | Admitting: Cardiology

## 2024-07-26 NOTE — Progress Notes (Signed)
 OV note faxed to Dr Cristy at [facility] at [phone] for shoulder surgery clearance.

## 2024-07-26 NOTE — Addendum Note (Signed)
 Encounter addended by: Buell Powell HERO, RN on: 07/26/2024 12:37 PM  Actions taken: Clinical Note Signed

## 2024-07-27 ENCOUNTER — Telehealth (HOSPITAL_COMMUNITY): Payer: Self-pay | Admitting: *Deleted

## 2024-07-27 DIAGNOSIS — I5022 Chronic systolic (congestive) heart failure: Secondary | ICD-10-CM

## 2024-07-27 MED ORDER — TORSEMIDE 20 MG PO TABS: 20.0000 mg | ORAL_TABLET | ORAL | 3 refills | Status: AC

## 2024-07-27 NOTE — Telephone Encounter (Signed)
 Called patient per Dr. Rolan with following lab results:  Creatinine higher than prior, decrease torsemide  to 20 mg every other day. Keep sodium intake low and repeat BMET 2 wks.  Pt verbalized understanding of same. Repeat lab ordered and scheduled.

## 2024-07-28 ENCOUNTER — Other Ambulatory Visit: Payer: Self-pay | Admitting: Family Medicine

## 2024-07-28 ENCOUNTER — Telehealth: Payer: Self-pay | Admitting: Family Medicine

## 2024-07-28 DIAGNOSIS — E669 Obesity, unspecified: Secondary | ICD-10-CM

## 2024-07-28 DIAGNOSIS — G4733 Obstructive sleep apnea (adult) (pediatric): Secondary | ICD-10-CM

## 2024-07-28 DIAGNOSIS — I5032 Chronic diastolic (congestive) heart failure: Secondary | ICD-10-CM

## 2024-07-28 NOTE — Telephone Encounter (Signed)
 Copied from CRM #[id number]. Topic: Clinical - Medication Prior Auth >> Jul 27, 2024  5:35 PM Tobias CROME wrote: Reason for CRM: Lyanne with [employer] calling about prior authorization for CPAP machine. Calling to inquire if office wanted to do an expedited appeal or call precertification department.  Phone number to pre-certification department: [phone]

## 2024-07-29 ENCOUNTER — Ambulatory Visit: Payer: Self-pay | Admitting: Neurology

## 2024-07-29 ENCOUNTER — Other Ambulatory Visit: Payer: Self-pay | Admitting: Family Medicine

## 2024-07-29 DIAGNOSIS — M5417 Radiculopathy, lumbosacral region: Secondary | ICD-10-CM

## 2024-07-29 DIAGNOSIS — I5032 Chronic diastolic (congestive) heart failure: Secondary | ICD-10-CM

## 2024-07-29 DIAGNOSIS — M48062 Spinal stenosis, lumbar region with neurogenic claudication: Secondary | ICD-10-CM | POA: Diagnosis not present

## 2024-07-29 DIAGNOSIS — G4733 Obstructive sleep apnea (adult) (pediatric): Secondary | ICD-10-CM

## 2024-07-29 DIAGNOSIS — E669 Obesity, unspecified: Secondary | ICD-10-CM

## 2024-07-29 NOTE — Procedures (Signed)
 " [facility]  [address] [place], [address]  [place], [place] [address] Tel: [phone] Fax: [phone] Test Date:  07/29/2024  Patient: [name] DOB: 12/26/1950 Physician: Tonita Blanch, DO  Sex: Male Height: 5' 10 Ref Phys: Tonita Blanch, DO  ID#: [id number]   Technician:    History: This is a 74 year old man referred for evaluation of bilateral leg paresthesias.  NCV & EMG Findings: Extensive electrodiagnostic testing of the right lower extremity and additional studies of the left shows:  Bilateral sural and superficial peroneal sensory responses are within normal limits. Bilateral peroneal motor responses are within normal limits.  Bilateral tibial motor responses show reduced amplitude (R1.5, L2.5 mV).   Tibial H reflex study on the right is absent, and prolonged on the left.   Chronic motor axon loss changes are seen affecting the S1 myotome bilaterally, without accompanied active denervation.  Proximal and deep muscles were not tested as the patient is on anticoagulation therapy.  Impression: Chronic S1 radiculopathy affecting bilateral lower extremities, mild. There is no evidence of a large fiber sensorimotor polyneuropathy affecting the lower extremities.   ___________________________ Tonita Blanch, DO    Nerve Conduction Studies   Stim Site NR Peak (ms) Norm Peak (ms) O-P Amp (V) Norm O-P Amp  Left Sup Peroneal Anti Sensory (Ant Lat Mall)  32 C  12 cm    3.3 <4.6 6.7 >3  Right Sup Peroneal Anti Sensory (Ant Lat Mall)  32 C  12 cm    2.9 <4.6 5.9 >3  Left Sural Anti Sensory (Lat Mall)  32 C  Calf    3.0 <4.6 11.0 >3  Right Sural Anti Sensory (Lat Mall)  32 C  Calf    2.4 <4.6 11.2 >3     Stim Site NR Onset (ms) Norm Onset (ms) O-P Amp (mV) Norm O-P Amp Site1 Site2 Delta-0 (ms) Dist (cm) Vel (m/s) Norm Vel (m/s)  Left Peroneal Motor (Ext Dig Brev)  32 C  Ankle    5.9 <6.0 3.2 >2.5 B Fib Ankle 10.5 42.0 40 >40  B Fib    16.6  2.5  Poplt B  Fib 2.2 9.0 41 >40  Poplt    18.8  2.5         Right Peroneal Motor (Ext Dig Brev)  32 C  Ankle    6.0 <6.0 3.2 >2.5 B Fib Ankle 10.3 39.0 42 >40  B Fib    16.6  2.6  Poplt B Fib 2.2 9.0 41 >40  Poplt    18.8  2.5         Left Tibial Motor (Abd Hall Brev)  32 C  Ankle    5.2 <6.0 *2.5 >4 Knee Ankle 10.8 47.0 44 >40  Knee    16.0  1.8         Right Tibial Motor (Abd Hall Brev)  32 C  Ankle    5.4 <6.0 *1.5 >4 Knee Ankle 10.0 42.0 42 >40  Knee    15.4  0.7          Electromyography   Side Muscle Ins.Act Fibs Fasc Recrt Amp Dur Poly Activation Comment  Right AntTibialis Nml Nml Nml Nml Nml Nml Nml Nml N/A  Right Gastroc Nml Nml Nml *1- *1+ *1+ *1+ Nml N/A  Right RectFemoris Nml Nml Nml Nml Nml Nml Nml Nml N/A  Right BicepsFemS Nml Nml Nml *1- *1+ *1+ *1+ Nml N/A  Left AntTibialis Nml Nml Nml Nml Nml Nml  Nml Nml N/A  Left Gastroc Nml Nml Nml *1- *1+ *1+ *1+ Nml N/A  Left RectFemoris Nml Nml Nml Nml Nml Nml Nml Nml N/A  Left BicepsFemS Nml Nml Nml *1- *1+ *1+ *1+ Nml N/A      Waveforms:                         "

## 2024-07-29 NOTE — Telephone Encounter (Signed)
 Requested medications are due for refill today.  See pharmacy note  Requested medications are on the active medications list.  yes  Last refill. 07/29/2024  Future visit scheduled.   no  Notes to clinic.    Pharmacy comment: Alternative Requested:PRIOR AUTHORIZATION.      Requested Prescriptions  Pending Prescriptions Disp Refills   ZEPBOUND  7.5 MG/0.5ML Pen [Pharmacy Med Name: ZEPBOUND  7.5 MG/0.5 ML PEN]  1    Sig: INJECT 7.5 MG SUBCUTANEOUSLY WEEKLY     Off-Protocol Failed - 07/29/2024  2:22 PM      Failed - Medication not assigned to a protocol, review manually.      Passed - Valid encounter within last 12 months    Recent Outpatient Visits           1 week ago Anterior cervical lymphadenopathy   [place] [facility] Kayla Jeoffrey RAMAN, FNP   1 month ago Idiopathic peripheral neuropathy   [place] [facility] Duanne Butler DASEN, MD   9 months ago Chronic congestive heart failure, unspecified heart failure type [facility])   [place] [facility] Duanne Butler DASEN, MD   1 year ago Persistent atrial fibrillation [facility])   [place] [facility] Duanne Butler DASEN, MD   1 year ago Acute left ankle pain   [place] [facility] [place], Butler DASEN, MD

## 2024-08-02 ENCOUNTER — Telehealth: Payer: Self-pay

## 2024-08-02 ENCOUNTER — Other Ambulatory Visit (HOSPITAL_COMMUNITY): Payer: Self-pay

## 2024-08-02 NOTE — Telephone Encounter (Signed)
 Pharmacy Patient Advocate Encounter  Received notification from [facility] that Prior Authorization for Zepbound  7.5mg /0.5ml has been APPROVED from 08/02/24 to 07/13/25   PA #/Case ID/Reference #: [id number]

## 2024-08-02 NOTE — Telephone Encounter (Signed)
 Pharmacy Patient Advocate Encounter   Received notification from RX Request Messages that prior authorization for Zepbound  7.5mg /0.39ml is required/requested.   Insurance verification completed.   The patient is insured through [facility].   Per test claim: PA required; PA submitted to above mentioned insurance via Latent Key/confirmation #/EOC [id number] Status is pending

## 2024-08-04 ENCOUNTER — Other Ambulatory Visit: Payer: Self-pay | Admitting: Family Medicine

## 2024-08-04 ENCOUNTER — Other Ambulatory Visit: Payer: Self-pay

## 2024-08-04 DIAGNOSIS — G4733 Obstructive sleep apnea (adult) (pediatric): Secondary | ICD-10-CM

## 2024-08-04 DIAGNOSIS — E669 Obesity, unspecified: Secondary | ICD-10-CM

## 2024-08-04 DIAGNOSIS — I5032 Chronic diastolic (congestive) heart failure: Secondary | ICD-10-CM

## 2024-08-04 DIAGNOSIS — G629 Polyneuropathy, unspecified: Secondary | ICD-10-CM

## 2024-08-04 MED ORDER — ZEPBOUND 7.5 MG/0.5ML ~~LOC~~ SOAJ: 7.5000 mg | SUBCUTANEOUS | 0 refills | Status: AC

## 2024-08-04 MED ORDER — GABAPENTIN 300 MG PO CAPS: 300.0000 mg | ORAL_CAPSULE | Freq: Three times a day (TID) | ORAL | 3 refills | Status: AC

## 2024-08-04 NOTE — Telephone Encounter (Addendum)
 Patient called to follow up on refill requested for tirzepatide  (ZEPBOUND ) 7.5 MG/0.5ML Pen  Patient has been out of this medication since 07/28/24.  Patient also stated his script for gabapentin  (NEURONTIN ) 300 MG capsule [[id number]]  should be 300MG  TID; pharmacy still only dispensing as 1 pill once daily.  Patient stated he  also called the pharmacy to follow up and isn't able to speak with anyone  **Patient is requesting for all of his scripts to be synchronized so he can pick them up at the same time to avoid multiple trips to the pharmacy.**  Pharmacy confirmed as   [id number] - [place], [place] - [address], [place] [address] Phone: [phone]  Fax: [phone] DEA #: [id number]   Please advise patient at [phone].

## 2024-08-04 NOTE — Telephone Encounter (Signed)
 Requested medications are due for refill today.  See pharmacy note   Requested medications are on the active medications list.  yes   Last refill. 07/29/2024   Future visit scheduled.   no   Notes to clinic.    Pharmacy comment: Alternative Requested:PRIOR AUTHORIZATION.       Requested Prescriptions  Pending Prescriptions Disp Refills   ZEPBOUND  7.5 MG/0.5ML Pen [Pharmacy Med Name: ZEPBOUND  7.5 MG/0.5 ML PEN]  1    Sig: INJECT 7.5 MG SUBCUTANEOUSLY WEEKLY     Off-Protocol Failed - 08/04/2024  9:01 AM      Failed - Medication not assigned to a protocol, review manually.      Passed - Valid encounter within last 12 months    Recent Outpatient Visits           2 weeks ago Anterior cervical lymphadenopathy   [place] [facility] Kayla Jeoffrey RAMAN, FNP   1 month ago Idiopathic peripheral neuropathy   [place] [facility] Duanne Butler DASEN, MD   9 months ago Chronic congestive heart failure, unspecified heart failure type [facility])   [place] [facility] Duanne Butler DASEN, MD   1 year ago Persistent atrial fibrillation [facility])   [place] [facility] Duanne Butler DASEN, MD   1 year ago Acute left ankle pain   [place] [facility] Pickard, Butler DASEN, MD

## 2024-08-04 NOTE — Telephone Encounter (Signed)
 Prescription Request  08/04/2024  LOV: 06/16/2024  What is the name of the medication or equipment?   colchicine  0.6 MG tablet  **90 day script requested**  Have you contacted your pharmacy to request a refill? Yes   Which pharmacy would you like this sent to?  [id number] - [place], [place] - [address] [place] [address] Phone: [phone] Fax: [phone]    Patient notified that their request is being sent to the clinical staff for review and that they should receive a response within 2 business days.   Please advise pharmacist.

## 2024-08-05 MED ORDER — COLCHICINE 0.6 MG PO TABS: 0.6000 mg | ORAL_TABLET | Freq: Every day | ORAL | 1 refills | Status: AC

## 2024-08-05 NOTE — Telephone Encounter (Signed)
 Requested medications are due for refill today.  unsure  Requested medications are on the active medications list.  yes  Last refill. 02/12/2024 #90 1 rf  Future visit scheduled.   no  Notes to clinic.  Sig from med list:   colchicine  0.6 MG tablet 90 tablet 1 02/12/2024 --  Sig:   PLEASE SEE ATTACHED FOR DETAILED DIRECTIONS    Route:   (none)     Please review for refill.    Requested Prescriptions  Pending Prescriptions Disp Refills   colchicine  0.6 MG tablet 90 tablet 1     Endocrinology:  Gout Agents - colchicine  Failed - 08/05/2024 10:28 AM      Failed - Cr in normal range and within 360 days    Creat  Date Value Ref Range Status  11/02/2023 1.24 0.70 - 1.28 mg/dL Final   Creatinine, Ser  Date Value Ref Range Status  07/25/2024 1.58 (H) 0.61 - 1.24 mg/dL Final         Passed - ALT in normal range and within 360 days    ALT  Date Value Ref Range Status  11/02/2023 13 9 - 46 U/L Final         Passed - AST in normal range and within 360 days    AST  Date Value Ref Range Status  11/02/2023 13 10 - 35 U/L Final         Passed - Valid encounter within last 12 months    Recent Outpatient Visits           2 weeks ago Anterior cervical lymphadenopathy   [place] [facility] Kayla Jeoffrey RAMAN, FNP   1 month ago Idiopathic peripheral neuropathy   [place] [facility] Duanne Butler DASEN, MD   9 months ago Chronic congestive heart failure, unspecified heart failure type [facility])   [place] [facility] Pickard, Butler DASEN, MD   1 year ago Persistent atrial fibrillation [facility])   [place] [facility] Pickard, Butler DASEN, MD   1 year ago Acute left ankle pain   [place] [facility] Pickard, Butler DASEN, MD              Passed - CBC within normal limits and completed in the last 12 months    WBC  Date Value Ref Range Status  07/25/2024 9.8 4.0 - 10.5 K/uL Final   RBC  Date Value  Ref Range Status  07/25/2024 4.98 4.22 - 5.81 MIL/uL Final   Hemoglobin  Date Value Ref Range Status  07/25/2024 14.6 13.0 - 17.0 g/dL Final  87/72/7977 85.6 13.0 - 17.7 g/dL Final   Total hemoglobin  Date Value Ref Range Status  05/20/2018 10.4 (L) 12.0 - 16.0 g/dL Final   HCT  Date Value Ref Range Status  07/25/2024 44.3 39.0 - 52.0 % Final   Hematocrit  Date Value Ref Range Status  07/09/2021 41.6 37.5 - 51.0 % Final   MCHC  Date Value Ref Range Status  07/25/2024 33.0 30.0 - 36.0 g/dL Final   [facility]  Date Value Ref Range Status  07/25/2024 29.3 26.0 - 34.0 pg Final   MCV  Date Value Ref Range Status  07/25/2024 89.0 80.0 - 100.0 fL Final  07/09/2021 84 79 - 97 fL Final   No results found for: PLTCOUNTKUC, LABPLAT, POCPLA RDW  Date Value Ref Range Status  07/25/2024 14.0 11.5 - 15.5 % Final  07/09/2021  13.5 11.6 - 15.4 % Final

## 2024-08-11 NOTE — Progress Notes (Signed)
 31 day ICM Remote transmission canceled due to [facility] clinic is on hold until further notice.  91 day remote monitoring will continue per protocol.

## 2024-08-15 ENCOUNTER — Ambulatory Visit (HOSPITAL_COMMUNITY)

## 2024-08-16 ENCOUNTER — Telehealth: Payer: Self-pay

## 2024-08-16 NOTE — Telephone Encounter (Signed)
 Copied from CRM [id number]. Topic: General - Other >> Aug 16, 2024  8:22 AM Antony RAMAN wrote: Reason for CRM: anna with [employer] calling to verify, on 1/28 there was no specific request recorded on clinical and to send cpt codes with request next time. And vendor details if for medical equipment  [phone] Fax-[phone] >> Aug 16, 2024 10:47 AM Emylou G wrote: Darden Seal to allow them to work on this.SABRA

## 2024-08-17 ENCOUNTER — Ambulatory Visit (HOSPITAL_COMMUNITY): Payer: Self-pay | Admitting: Family Medicine

## 2024-08-17 ENCOUNTER — Ambulatory Visit (HOSPITAL_COMMUNITY): Admission: RE | Admit: 2024-08-17 | Discharge: 2024-08-17 | Attending: Cardiology

## 2024-08-17 DIAGNOSIS — I5022 Chronic systolic (congestive) heart failure: Secondary | ICD-10-CM

## 2024-08-17 LAB — BASIC METABOLIC PANEL WITH GFR
Anion gap: 8 (ref 5–15)
BUN: 36 mg/dL — ABNORMAL HIGH (ref 8–23)
CO2: 25 mmol/L (ref 22–32)
Calcium: 9.4 mg/dL (ref 8.9–10.3)
Chloride: 103 mmol/L (ref 98–111)
Creatinine, Ser: 1.34 mg/dL — ABNORMAL HIGH (ref 0.61–1.24)
GFR, Estimated: 57 mL/min — ABNORMAL LOW
Glucose, Bld: 89 mg/dL (ref 70–99)
Potassium: 5.1 mmol/L (ref 3.5–5.1)
Sodium: 137 mmol/L (ref 135–145)

## 2024-08-18 NOTE — Progress Notes (Signed)
 Sent message, via epic in basket, requesting orders in epic from [employer].

## 2024-08-25 ENCOUNTER — Encounter (HOSPITAL_COMMUNITY): Admission: RE | Admit: 2024-08-25

## 2024-09-07 ENCOUNTER — Ambulatory Visit (HOSPITAL_COMMUNITY): Admit: 2024-09-07 | Admitting: Orthopaedic Surgery

## 2024-09-07 SURGERY — ARTHROPLASTY, SHOULDER, TOTAL, REVERSE
Anesthesia: General | Site: Shoulder | Laterality: Left

## 2024-09-12 ENCOUNTER — Ambulatory Visit

## 2024-09-13 ENCOUNTER — Ambulatory Visit

## 2024-09-19 ENCOUNTER — Ambulatory Visit: Admitting: Neurology

## 2024-11-16 ENCOUNTER — Ambulatory Visit: Payer: Self-pay | Admitting: Neurology

## 2025-01-23 ENCOUNTER — Ambulatory Visit (HOSPITAL_COMMUNITY)
